# Patient Record
Sex: Male | Born: 1965 | Race: Black or African American | Hispanic: No | Marital: Married | Smoking: Current every day smoker
Health system: Southern US, Community
[De-identification: ages and names within clinical notes are randomized; demographics above are authoritative.]

## PROBLEM LIST (undated history)

## (undated) ENCOUNTER — Telehealth

## (undated) ENCOUNTER — Ambulatory Visit

## (undated) ENCOUNTER — Encounter

## (undated) ENCOUNTER — Ambulatory Visit: Payer: MEDICARE | Attending: Physical Medicine & Rehabilitation | Primary: Physical Medicine & Rehabilitation

## (undated) ENCOUNTER — Inpatient Hospital Stay

## (undated) ENCOUNTER — Ambulatory Visit: Attending: Pharmacist | Primary: Pharmacist

## (undated) ENCOUNTER — Ambulatory Visit: Payer: MEDICARE

## (undated) ENCOUNTER — Ambulatory Visit: Payer: MEDICARE | Attending: Cardiovascular Disease | Primary: Cardiovascular Disease

## (undated) ENCOUNTER — Encounter: Attending: Physical Medicine & Rehabilitation | Primary: Physical Medicine & Rehabilitation

## (undated) ENCOUNTER — Encounter: Attending: Family Medicine | Primary: Family Medicine

## (undated) ENCOUNTER — Institutional Professional Consult (permissible substitution): Payer: MEDICARE | Attending: Physical Medicine & Rehabilitation | Primary: Physical Medicine & Rehabilitation

## (undated) ENCOUNTER — Telehealth: Attending: Neurological Surgery | Primary: Neurological Surgery

## (undated) ENCOUNTER — Ambulatory Visit: Attending: Family Medicine | Primary: Family Medicine

## (undated) DIAGNOSIS — R0602 Shortness of breath: Secondary | ICD-10-CM

## (undated) DIAGNOSIS — I10 Essential (primary) hypertension: Secondary | ICD-10-CM

## (undated) DIAGNOSIS — F419 Anxiety disorder, unspecified: Secondary | ICD-10-CM

## (undated) DIAGNOSIS — F17200 Nicotine dependence, unspecified, uncomplicated: Secondary | ICD-10-CM

## (undated) DIAGNOSIS — R9439 Abnormal result of other cardiovascular function study: Secondary | ICD-10-CM

## (undated) DIAGNOSIS — J45909 Unspecified asthma, uncomplicated: Secondary | ICD-10-CM

## (undated) DIAGNOSIS — E785 Hyperlipidemia, unspecified: Secondary | ICD-10-CM

## (undated) DIAGNOSIS — I2510 Atherosclerotic heart disease of native coronary artery without angina pectoris: Secondary | ICD-10-CM

## (undated) DIAGNOSIS — Z951 Presence of aortocoronary bypass graft: Secondary | ICD-10-CM

## (undated) HISTORY — DX: Shortness of breath: R06.02

## (undated) HISTORY — PX: CORONARY ARTERY BYPASS GRAFT: SHX141

## (undated) HISTORY — DX: Abnormal result of other cardiovascular function study: R94.39

## (undated) HISTORY — PX: CARDIAC CATHETERIZATION: SHX172

## (undated) SURGERY — Surgical Case
Anesthesia: *Unknown

## (undated) MED ORDER — RANOLAZINE ORAL: ORAL | 0 days

## (undated) MED ORDER — REPATHA SURECLICK 140 MG/ML SUBCUTANEOUS PEN INJECTOR: 140 mg | mL | 2 refills | 28 days | Status: CN

## (undated) MED ORDER — NITROGLYCERIN 0.4 MG SUBLINGUAL TABLET: SUBLINGUAL | 0 days | PRN

---
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
## 1999-01-14 ENCOUNTER — Emergency Department (HOSPITAL_COMMUNITY): Admission: EM | Admit: 1999-01-14 | Discharge: 1999-01-14 | Payer: Self-pay | Admitting: Emergency Medicine

## 2010-06-23 ENCOUNTER — Emergency Department (HOSPITAL_COMMUNITY)
Admission: EM | Admit: 2010-06-23 | Discharge: 2010-06-23 | Disposition: A | Discharge status: Home / Self Care | Payer: Self-pay | Attending: Emergency Medicine | Admitting: Emergency Medicine

## 2010-06-23 DIAGNOSIS — K089 Disorder of teeth and supporting structures, unspecified: Secondary | ICD-10-CM | POA: Insufficient documentation

## 2010-06-23 DIAGNOSIS — K047 Periapical abscess without sinus: Secondary | ICD-10-CM | POA: Insufficient documentation

## 2012-07-15 ENCOUNTER — Emergency Department (HOSPITAL_COMMUNITY)
Admission: EM | Admit: 2012-07-15 | Discharge: 2012-07-15 | Disposition: A | Discharge status: Home / Self Care | Payer: No Typology Code available for payment source | Attending: Emergency Medicine | Admitting: Emergency Medicine

## 2012-07-15 ENCOUNTER — Emergency Department (HOSPITAL_COMMUNITY): Payer: No Typology Code available for payment source

## 2012-07-15 ENCOUNTER — Encounter (HOSPITAL_COMMUNITY): Payer: Self-pay

## 2012-07-15 DIAGNOSIS — M79605 Pain in left leg: Secondary | ICD-10-CM

## 2012-07-15 DIAGNOSIS — Y9301 Activity, walking, marching and hiking: Secondary | ICD-10-CM | POA: Insufficient documentation

## 2012-07-15 DIAGNOSIS — Y9289 Other specified places as the place of occurrence of the external cause: Secondary | ICD-10-CM | POA: Insufficient documentation

## 2012-07-15 DIAGNOSIS — IMO0002 Reserved for concepts with insufficient information to code with codable children: Secondary | ICD-10-CM | POA: Insufficient documentation

## 2012-07-15 DIAGNOSIS — W010XXA Fall on same level from slipping, tripping and stumbling without subsequent striking against object, initial encounter: Secondary | ICD-10-CM | POA: Insufficient documentation

## 2012-07-15 DIAGNOSIS — S99929A Unspecified injury of unspecified foot, initial encounter: Secondary | ICD-10-CM | POA: Insufficient documentation

## 2012-07-15 DIAGNOSIS — T148XXA Other injury of unspecified body region, initial encounter: Secondary | ICD-10-CM

## 2012-07-15 DIAGNOSIS — W102XXA Fall (on)(from) incline, initial encounter: Secondary | ICD-10-CM

## 2012-07-15 DIAGNOSIS — W172XXA Fall into hole, initial encounter: Secondary | ICD-10-CM | POA: Insufficient documentation

## 2012-07-15 DIAGNOSIS — S8990XA Unspecified injury of unspecified lower leg, initial encounter: Secondary | ICD-10-CM | POA: Insufficient documentation

## 2012-07-15 MED ORDER — OXYCODONE-ACETAMINOPHEN 5-325 MG PO TABS
2.0000 | ORAL_TABLET | Freq: Once | ORAL | Status: AC
  Administered 2012-07-15: 2 via ORAL
  Filled 2012-07-15: qty 2

## 2012-07-15 MED ORDER — NAPROXEN 500 MG PO TABS: 500.0000 mg | ORAL_TABLET | Freq: Two times a day (BID) | ORAL | Status: DC

## 2012-07-15 NOTE — ED Provider Notes (Signed)
History     CSN: [id number]  Arrival date & time 07/15/12  7829   First MD Initiated Contact with Patient 07/15/12 0701      Chief Complaint  Patient presents with  . Fall  . Leg Pain    (Consider location/radiation/quality/duration/timing/severity/associated sxs/prior treatment) The history is provided by the patient and medical records. No language interpreter was used.    Benjamin Espinoza is a 47 y.o. male  with a hx of childhood asthma presents to the Emergency Department complaining of gradual, persistent, progressively worsening pain in his left ankle and leg after falling into a dirtch onset approx 2 am. Pt states he was walking along when he stumbled and fell into a ditch. He was able to get up and walk home, but when he awoke later the pain was intense and he was unable to bear weight.  Pt states he smoked mariajuana tonight, but did not use any alcohol.  Associated symptoms include pain in the L leg and wound to the L lower leg.  Nothing makes it better and attempting to move makes it worse.  Pt denies fever, chills, headache, neck pain, back pain, chest pain SOB, nausea, vomiting, diarrhea, weakness, dizziness, abdominal pain.     History reviewed. No pertinent past medical history.  History reviewed. No pertinent past surgical history.  No family history on file.  History  Substance Use Topics  . Smoking status: Not on file  . Smokeless tobacco: Not on file  . Alcohol Use: Not on file      Review of Systems  Constitutional: Negative for fever and chills.  HENT: Negative for neck pain and neck stiffness.   Respiratory: Negative for shortness of breath.   Cardiovascular: Negative for chest pain.  Gastrointestinal: Negative for nausea, vomiting and abdominal pain.  Musculoskeletal: Positive for joint swelling, arthralgias and gait problem. Negative for back pain.  Skin: Positive for wound.  Allergic/Immunologic: Negative for immunocompromised state.    Neurological: Negative for numbness.  Hematological: Does not bruise/bleed easily.  Psychiatric/Behavioral: The patient is not nervous/anxious.   All other systems reviewed and are negative.    Allergies  Review of patient's allergies indicates no known allergies.  Home Medications   Current Outpatient Rx  Name  Route  Sig  Dispense  Refill  . naproxen (NAPROSYN) 500 MG tablet   Oral   Take 1 tablet (500 mg total) by mouth 2 (two) times daily with a meal.   30 tablet   0     BP 129/75  Pulse 72  Temp(Src) 98.2 F (36.8 C) (Oral)  Resp 18  SpO2 98%  Physical Exam  Nursing note and vitals reviewed. Constitutional: He is oriented to person, place, and time. He appears well-developed and well-nourished. No distress.  HENT:  Head: Normocephalic and atraumatic.  Mouth/Throat: Oropharynx is clear and moist.  Eyes: Conjunctivae and EOM are normal. Pupils are equal, round, and reactive to light.  Neck: Normal range of motion and full passive range of motion without pain. No spinous process tenderness and no muscular tenderness present. No rigidity. Normal range of motion present.  Cardiovascular: Normal rate, regular rhythm, normal heart sounds and intact distal pulses.   No murmur heard. Capillary refill < 3 seconds  Pulmonary/Chest: Effort normal and breath sounds normal. No respiratory distress. He has no wheezes. He has no rales.  Abdominal: Soft. Bowel sounds are normal. There is no tenderness.  Musculoskeletal: He exhibits tenderness. He exhibits no edema.  Left lower leg: He exhibits tenderness and swelling. He exhibits no bony tenderness, no edema, no deformity and no laceration.       Legs: ROM: full ROM of the bilateral lower extremities  Lymphadenopathy:    He has no cervical adenopathy.  Neurological: He is alert and oriented to person, place, and time. Coordination normal. GCS eye subscore is 4. GCS verbal subscore is 5. GCS motor subscore is 6.  Reflex  Scores:      Tricep reflexes are 2+ on the right side and 2+ on the left side.      Bicep reflexes are 2+ on the right side and 2+ on the left side.      Brachioradialis reflexes are 2+ on the right side and 2+ on the left side.      Patellar reflexes are 2+ on the right side and 2+ on the left side.      Achilles reflexes are 2+ on the right side and 2+ on the left side. Sensation intact to dull and sharp Strength 5/5 in the bilateral lower extremities  Skin: Skin is warm and dry. He is not diaphoretic. No erythema.  No tenting of the skin Large abrasion to the left anterior lower leg  Psychiatric: He has a normal mood and affect.    ED Course  Procedures (including critical care time)  Labs Reviewed - No data to display Dg Tibia/fibula Left  07/15/2012   *RADIOLOGY REPORT*  Clinical Data: Injury  LEFT TIBIA AND FIBULA - 2 VIEW  Comparison: None.  Findings: No acute fracture and no dislocation.  IMPRESSION: No acute bony pathology.   Original Report Authenticated By: Jolaine Click, M.D.   Dg Ankle Complete Left  07/15/2012   *RADIOLOGY REPORT*  Clinical Data: Fall  LEFT ANKLE COMPLETE - 3+ VIEW  Comparison: None.  Findings: No acute fracture.  No dislocation.  IMPRESSION: No acute bony pathology.   Original Report Authenticated By: Jolaine Click, M.D.     1. Fall (on)(from) incline, initial encounter   2. Abrasion   3. Leg pain, anterior, left       MDM  [name] presents with extremity pain after fall with large abrasion. Patient X-Ray negative for obvious fracture or dislocation. I personally reviewed the imaging tests through PACS system.  I reviewed available ER/hospitalization records through the EMR.  Pain managed in ED. Would cleaned and extremity wrapped with ace bandage.  Pt advised to follow up with orthopedics if symptoms persist for possibility of missed fracture diagnosis. Patient given crutches while in ED, conservative therapy recommended and discussed. Patient  will be dc home & is agreeable with above plan.         Dahlia Client Saylah Ketner, PA-C 07/15/12 [phone]

## 2012-07-15 NOTE — ED Notes (Signed)
Pt arrives by EMS-walking home this AM on a path and states he fell in to a ditch-now c/o LLE pain and unable to bear weight-abrasion below left knee-no deformity per EMS-pain mid shaft/tib-fib.

## 2012-07-15 NOTE — ED Notes (Signed)
[id number]<AV> Expected date:<BR> Expected time:<BR> Means of arrival:<BR> Comments:<BR> EMS/46 yo male-fall yesterday and now c/o LLE pain-unable to bear weight

## 2012-07-16 NOTE — ED Provider Notes (Signed)
Medical screening examination/treatment/procedure(s) were performed by non-physician practitioner and as supervising physician I was immediately available for consultation/collaboration.  Kelin Borum, MD 07/16/12 1040 

## 2015-03-02 DIAGNOSIS — I219 Acute myocardial infarction, unspecified: Secondary | ICD-10-CM

## 2015-03-02 HISTORY — DX: Acute myocardial infarction, unspecified: I21.9

## 2016-09-16 ENCOUNTER — Ambulatory Visit
Admission: RE | Admit: 2016-09-16 | Discharge: 2016-09-16 | Payer: MEDICAID | Attending: Cardiovascular Disease | Admitting: Cardiovascular Disease

## 2016-09-16 DIAGNOSIS — R0789 Other chest pain: Principal | ICD-10-CM

## 2016-09-28 ENCOUNTER — Emergency Department
Admission: EM | Admit: 2016-09-28 | Discharge: 2016-09-28 | Discharge status: Against Medical Advice | Source: Intra-hospital

## 2016-09-28 ENCOUNTER — Ambulatory Visit: Admission: RE | Admit: 2016-09-28 | Discharge: 2016-09-28 | Disposition: A | Discharge status: Home / Self Care

## 2016-09-28 DIAGNOSIS — M545 Low back pain: Principal | ICD-10-CM

## 2016-09-28 MED ORDER — PENICILLIN V POTASSIUM 500 MG TABLET
ORAL_TABLET | Freq: Four times a day (QID) | ORAL | 0 refills | 0.00000 days | Status: CP
Start: 2016-09-28 — End: 2016-10-08

## 2016-09-28 MED ORDER — IBUPROFEN 600 MG TABLET
ORAL_TABLET | Freq: Four times a day (QID) | ORAL | 0 refills | 0 days | Status: CP | PRN
Start: 2016-09-28 — End: 2018-02-27

## 2016-09-29 ENCOUNTER — Ambulatory Visit
Admission: RE | Admit: 2016-09-29 | Discharge: 2016-10-28 | Disposition: A | Discharge status: Home / Self Care | Payer: MEDICAID

## 2016-09-29 DIAGNOSIS — R0789 Other chest pain: Principal | ICD-10-CM

## 2016-11-08 ENCOUNTER — Ambulatory Visit: Admission: RE | Admit: 2016-11-08 | Discharge: 2016-11-08 | Disposition: A | Discharge status: Home / Self Care

## 2016-11-08 DIAGNOSIS — M5116 Intervertebral disc disorders with radiculopathy, lumbar region: Principal | ICD-10-CM

## 2016-11-17 ENCOUNTER — Ambulatory Visit
Admission: RE | Admit: 2016-11-17 | Discharge: 2016-11-17 | Disposition: A | Discharge status: Home / Self Care | Payer: MEDICAID

## 2016-11-17 DIAGNOSIS — M5137 Other intervertebral disc degeneration, lumbosacral region: Principal | ICD-10-CM

## 2016-11-17 DIAGNOSIS — M5116 Intervertebral disc disorders with radiculopathy, lumbar region: Principal | ICD-10-CM

## 2017-05-07 ENCOUNTER — Encounter (HOSPITAL_COMMUNITY): Payer: Self-pay | Admitting: Emergency Medicine

## 2017-05-07 ENCOUNTER — Emergency Department (HOSPITAL_COMMUNITY): Payer: Medicaid Other

## 2017-05-07 ENCOUNTER — Other Ambulatory Visit: Payer: Self-pay

## 2017-05-07 ENCOUNTER — Observation Stay (HOSPITAL_COMMUNITY)
Admission: EM | Admit: 2017-05-07 | Discharge: 2017-05-08 | Disposition: A | Discharge status: Home / Self Care | Payer: Medicaid Other | Attending: Cardiology | Admitting: Cardiology

## 2017-05-07 DIAGNOSIS — I160 Hypertensive urgency: Secondary | ICD-10-CM | POA: Diagnosis not present

## 2017-05-07 DIAGNOSIS — R079 Chest pain, unspecified: Secondary | ICD-10-CM | POA: Diagnosis present

## 2017-05-07 DIAGNOSIS — R0789 Other chest pain: Secondary | ICD-10-CM

## 2017-05-07 DIAGNOSIS — I200 Unstable angina: Principal | ICD-10-CM

## 2017-05-07 DIAGNOSIS — Z79899 Other long term (current) drug therapy: Secondary | ICD-10-CM | POA: Insufficient documentation

## 2017-05-07 HISTORY — DX: Presence of aortocoronary bypass graft: Z95.1

## 2017-05-07 HISTORY — DX: Other chest pain: R07.89

## 2017-05-07 HISTORY — DX: Hyperlipidemia, unspecified: E78.5

## 2017-05-07 HISTORY — DX: Nicotine dependence, unspecified, uncomplicated: F17.200

## 2017-05-07 HISTORY — DX: Essential (primary) hypertension: I10

## 2017-05-07 LAB — I-STAT TROPONIN, ED: Troponin i, poc: 0.01 ng/mL (ref 0.00–0.08)

## 2017-05-07 LAB — LIPID PANEL
CHOLESTEROL: 191 mg/dL (ref 0–200)
HDL: 27 mg/dL — ABNORMAL LOW (ref >40)
LDL Cholesterol: 152 mg/dL — ABNORMAL HIGH (ref 0–99)
Total CHOL/HDL Ratio: 7.1 RATIO
Triglycerides: 61 mg/dL (ref <150)
VLDL: 12 mg/dL (ref 0–40)

## 2017-05-07 LAB — CBC
HEMATOCRIT: 45 % (ref 39.0–52.0)
Hemoglobin: 15.4 g/dL (ref 13.0–17.0)
MCH: 31.2 pg (ref 26.0–34.0)
MCHC: 34.2 g/dL (ref 30.0–36.0)
MCV: 91.3 fL (ref 78.0–100.0)
Platelets: 320 10*3/uL (ref 150–400)
RBC: 4.93 MIL/uL (ref 4.22–5.81)
RDW: 14.1 % (ref 11.5–15.5)
WBC: 6.3 10*3/uL (ref 4.0–10.5)

## 2017-05-07 LAB — BASIC METABOLIC PANEL
Anion gap: 10 (ref 5–15)
BUN: 9 mg/dL (ref 6–20)
CO2: 24 mmol/L (ref 22–32)
Calcium: 10.1 mg/dL (ref 8.9–10.3)
Chloride: 103 mmol/L (ref 101–111)
Creatinine, Ser: 1.04 mg/dL (ref 0.61–1.24)
GFR calc Af Amer: >60 mL/min (ref >60)
GLUCOSE: 95 mg/dL (ref 65–99)
POTASSIUM: 3.7 mmol/L (ref 3.5–5.1)
Sodium: 137 mmol/L (ref 135–145)

## 2017-05-07 LAB — TROPONIN I

## 2017-05-07 LAB — HEPARIN LEVEL (UNFRACTIONATED): Heparin Unfractionated: 0.52 IU/mL (ref 0.30–0.70)

## 2017-05-07 MED ORDER — ALPRAZOLAM 0.25 MG PO TABS: 0.2500 mg | ORAL_TABLET | Freq: Two times a day (BID) | ORAL | Status: DC | PRN

## 2017-05-07 MED ORDER — HYDROCHLOROTHIAZIDE 12.5 MG PO CAPS
12.5000 mg | ORAL_CAPSULE | Freq: Every day | ORAL | Status: DC
  Administered 2017-05-07 – 2017-05-08 (×2): 12.5 mg via ORAL
  Filled 2017-05-07 (×2): qty 1

## 2017-05-07 MED ORDER — ACETAMINOPHEN 325 MG PO TABS
650.0000 mg | ORAL_TABLET | ORAL | Status: DC | PRN
  Administered 2017-05-07: 650 mg via ORAL
  Filled 2017-05-07: qty 2

## 2017-05-07 MED ORDER — ONDANSETRON HCL 4 MG/2ML IJ SOLN: 4.0000 mg | Freq: Four times a day (QID) | INTRAMUSCULAR | Status: DC | PRN

## 2017-05-07 MED ORDER — HEPARIN (PORCINE) IN NACL 100-0.45 UNIT/ML-% IJ SOLN
1200.0000 [IU]/h | INTRAMUSCULAR | Status: DC
  Administered 2017-05-07 – 2017-05-08 (×2): 1200 [IU]/h via INTRAVENOUS
  Filled 2017-05-07 (×2): qty 250

## 2017-05-07 MED ORDER — ASPIRIN EC 81 MG PO TBEC
81.0000 mg | DELAYED_RELEASE_TABLET | Freq: Every day | ORAL | Status: DC
  Administered 2017-05-08: 81 mg via ORAL
  Filled 2017-05-07 (×2): qty 1

## 2017-05-07 MED ORDER — METOPROLOL SUCCINATE ER 50 MG PO TB24
50.0000 mg | ORAL_TABLET | Freq: Every day | ORAL | Status: DC
  Administered 2017-05-07 – 2017-05-08 (×2): 50 mg via ORAL
  Filled 2017-05-07 (×2): qty 1

## 2017-05-07 MED ORDER — SODIUM CHLORIDE 0.9% FLUSH: 3.0000 mL | INTRAVENOUS | Status: DC | PRN

## 2017-05-07 MED ORDER — NITROGLYCERIN 0.4 MG SL SUBL: 0.4000 mg | SUBLINGUAL_TABLET | SUBLINGUAL | Status: DC | PRN

## 2017-05-07 MED ORDER — SODIUM CHLORIDE 0.9% FLUSH
3.0000 mL | Freq: Two times a day (BID) | INTRAVENOUS | Status: DC
  Administered 2017-05-07 – 2017-05-08 (×2): 3 mL via INTRAVENOUS

## 2017-05-07 MED ORDER — LISINOPRIL 20 MG PO TABS
20.0000 mg | ORAL_TABLET | Freq: Every day | ORAL | Status: DC
  Administered 2017-05-07 – 2017-05-08 (×2): 20 mg via ORAL
  Filled 2017-05-07 (×2): qty 1

## 2017-05-07 MED ORDER — NITROGLYCERIN IN D5W 200-5 MCG/ML-% IV SOLN
0.0000 ug/min | Freq: Once | INTRAVENOUS | Status: AC
  Administered 2017-05-07: 5 ug/min via INTRAVENOUS
  Filled 2017-05-07: qty 250

## 2017-05-07 MED ORDER — HEPARIN BOLUS VIA INFUSION
4000.0000 [IU] | Freq: Once | INTRAVENOUS | Status: AC
  Administered 2017-05-07: 4000 [IU] via INTRAVENOUS
  Filled 2017-05-07: qty 4000

## 2017-05-07 MED ORDER — AMLODIPINE BESYLATE 5 MG PO TABS
5.0000 mg | ORAL_TABLET | Freq: Every day | ORAL | Status: DC
  Administered 2017-05-07 – 2017-05-08 (×2): 5 mg via ORAL
  Filled 2017-05-07 (×2): qty 1

## 2017-05-07 MED ORDER — ATORVASTATIN CALCIUM 80 MG PO TABS
80.0000 mg | ORAL_TABLET | Freq: Every day | ORAL | Status: DC
  Administered 2017-05-07: 80 mg via ORAL
  Filled 2017-05-07 (×2): qty 1

## 2017-05-07 MED ORDER — SODIUM CHLORIDE 0.9 % IV SOLN: 250.0000 mL | INTRAVENOUS | Status: DC | PRN

## 2017-05-07 NOTE — Progress Notes (Addendum)
ANTICOAGULATION CONSULT NOTE - Follow Up Consult  Pharmacy Consult for Heparin  Indication: chest pain/ACS  No Known Allergies  Patient Measurements:   Heparin Dosing Weight: 99 kg  Vital Signs: Temp: 98.6 F (37 C) (03/09 1230) Temp Source: Oral (03/09 1230) BP: 145/107 (03/09 1430) Pulse Rate: 74 (03/09 1430)  Labs: Recent Labs    05/07/17 1240  HGB 15.4  HCT 45.0  PLT 320  CREATININE 1.04    CrCl cannot be calculated (Unknown ideal weight.).  Assessment: 52 year old male to begin heparin for chest pain History of CABG in the past  Goal of Therapy:  Heparin level 0.3-0.7 units/ml Monitor platelets by anticoagulation protocol: Yes   Plan:  Heparin 4000 units iv bolus x 1 Heparin drip at 1200 units / hr Heparin level 6 hours after heparin starts Daily heparin level, CBC  Thank you Okey Regal, PharmD [phone]  05/07/2017,2:44 PM

## 2017-05-07 NOTE — ED Provider Notes (Signed)
[facility] EMERGENCY DEPARTMENT Provider Note   CSN: [id number] Arrival date & time: 05/07/17  1215     History   Chief Complaint Chief Complaint  Patient presents with  . Chest Pain    HPI Benjamin Espinoza is a 52 y.o. male.  Complains of left anterior chest pain described as pressure onset 10 AM today while at rest.  Worse with exertion.  Feels like "heart pain" he said in the past.  No associated nausea shortness of breath or sweatiness.  He was treated in the field with aspirin 700 mg as well as 2 sublingual nitroglycerin tablets which transiently alleviated his discomfort.  Presently discomfort is 3 on a scale of 1-10.  Patient recently evaluated at [facility].  He was determined that he needed to have a cardiac catheterization which she reports is scheduled for 05/09/2017.  He reports that he has been out of all of his medications for the past 5 days since he has been incarcerated  HPI  History reviewed. No pertinent past medical history.  There are no active problems to display for this patient.   History reviewed. No pertinent surgical history.  Past medical history coronary artery disease hypercholesterolemia Past surgical history CABG   Home Medications    Prior to Admission medications   Medication Sig Start Date End Date Taking? Authorizing Provider  naproxen (NAPROSYN) 500 MG tablet Take 1 tablet (500 mg total) by mouth 2 (two) times daily with a meal. 07/15/12   Muthersbaugh, Dahlia Client, PA-C    Family History No family history on file.  Social History Social History   Tobacco Use  . Smoking status: Not on file  Substance Use Topics  . Alcohol use: No    Frequency: Never  . Drug use: No  Positive smoker no alcohol positive marijuana use Currently incarcerated  Allergies   Patient has no known allergies.   Review of Systems Review of Systems  Constitutional: Negative.   HENT: Positive for dental problem.        Gingival  pain at lower gingiva  Respiratory: Negative.   Cardiovascular: Positive for chest pain.  Gastrointestinal: Negative.   Musculoskeletal: Negative.   Skin: Negative.   Neurological: Negative.   Psychiatric/Behavioral: Negative.   All other systems reviewed and are negative.    Physical Exam Updated Vital Signs BP (!) 145/106   Pulse 71   Temp 98.6 F (37 C) (Oral)   Resp 15   SpO2 94%   Physical Exam  Constitutional: He appears well-developed and well-nourished.  HENT:  Head: Normocephalic and atraumatic.  Teeth intact.  Not loose.  Not tender.  Lower gingiva anteriorly mildly tender not red or swollen or fluctuant  Eyes: Conjunctivae are normal. Pupils are equal, round, and reactive to light.  Neck: Neck supple. No tracheal deviation present. No thyromegaly present.  Cardiovascular: Normal rate and regular rhythm.  No murmur heard. Pulmonary/Chest: Effort normal and breath sounds normal.  Abdominal: Soft. Bowel sounds are normal. He exhibits no distension. There is no tenderness.  Musculoskeletal: Normal range of motion. He exhibits no edema or tenderness.  Neurological: He is alert. Coordination normal.  Skin: Skin is warm and dry. No rash noted.  Psychiatric: He has a normal mood and affect.  Nursing note and vitals reviewed.    ED Treatments / Results  Labs (all labs ordered are listed, but only abnormal results are displayed) Labs Reviewed  BASIC METABOLIC PANEL  CBC  I-STAT TROPONIN, ED  EKG  EKG Interpretation  Date/Time:  Saturday May 07 2017 12:28:23 EST Ventricular Rate:  64 PR Interval:    QRS Duration: 97 QT Interval:  414 QTC Calculation: 428 R Axis:   110 Text Interpretation:  Sinus rhythm Right axis deviation Consider left ventricular hypertrophy No old tracing to compare Confirmed by Harriman, Doreatha Martin [id number]) on 05/07/2017 12:30:48 PM       Radiology Dg Chest 2 View  Result Date: 05/07/2017 CLINICAL DATA:  Chest pain EXAM: CHEST - 2  VIEW COMPARISON:  02/01/2017 FINDINGS: Normal heart size and mediastinal contours. Status post CABG. There is no edema, consolidation, effusion, or pneumothorax. No acute osseous finding IMPRESSION: No evidence of active disease. Electronically Signed   By: Marnee Spring M.D.   On: 05/07/2017 13:20   Chest x-ray viewed by me Procedures Procedures (including critical care time)  Medications Ordered in ED Medications  nitroGLYCERIN 50 mg in dextrose 5 % 250 mL (0.2 mg/mL) infusion (not administered)   Results for orders placed or performed during the hospital encounter of 05/07/17  Basic metabolic panel  Result Value Ref Range   Sodium 137 135 - 145 mmol/L   Potassium 3.7 3.5 - 5.1 mmol/L   Chloride 103 101 - 111 mmol/L   CO2 24 22 - 32 mmol/L   Glucose, Bld 95 65 - 99 mg/dL   BUN 9 6 - 20 mg/dL   Creatinine, Ser 5.36 0.61 - 1.24 mg/dL   Calcium 14.4 8.9 - 31.5 mg/dL   GFR calc non Af Amer >60 >60 mL/min   GFR calc Af Amer >60 >60 mL/min   Anion gap 10 5 - 15  CBC  Result Value Ref Range   WBC 6.3 4.0 - 10.5 K/uL   RBC 4.93 4.22 - 5.81 MIL/uL   Hemoglobin 15.4 13.0 - 17.0 g/dL   HCT 40.0 86.7 - 61.9 %   MCV 91.3 78.0 - 100.0 fL   MCH 31.2 26.0 - 34.0 pg   MCHC 34.2 30.0 - 36.0 g/dL   RDW 50.9 32.6 - 71.2 %   Platelets 320 150 - 400 K/uL  I-stat troponin, ED  Result Value Ref Range   Troponin i, poc 0.01 0.00 - 0.08 ng/mL   Comment 3           Dg Chest 2 View  Result Date: 05/07/2017 CLINICAL DATA:  Chest pain EXAM: CHEST - 2 VIEW COMPARISON:  02/01/2017 FINDINGS: Normal heart size and mediastinal contours. Status post CABG. There is no edema, consolidation, effusion, or pneumothorax. No acute osseous finding IMPRESSION: No evidence of active disease. Electronically Signed   By: Marnee Spring M.D.   On: 05/07/2017 13:20    Initial Impression / Assessment and Plan / ED Course  I have reviewed the triage vital signs and the nursing notes.  Pertinent labs & imaging  results that were available during my care of the patient were reviewed by me and considered in my medical decision making (see chart for details).     Intravenous heparin per pharmacy for ACS ordered by me.  Intravenous nitroglycerin drip to titrate the pain ordered by me.  Dr.Gangi consulted by me and will see patient in ED and arrange for overnight stay 3:25 PM PAIN IMPROVED after treatment with intravenous nitroglycerin .  IV heparin ordered per pharmacy protocol Final Clinical Impressions(s) / ED Diagnoses  Dx Unstable angina #2 tobacco abuse CRITICAL CARE Performed by: Doug Sou Total critical care time: 35 minutes Critical care time was  exclusive of separately billable procedures and treating other patients. Critical care was necessary to treat or prevent imminent or life-threatening deterioration. Critical care was time spent personally by me on the following activities: development of treatment plan with patient and/or surrogate as well as nursing, discussions with consultants, evaluation of patient's response to treatment, examination of patient, obtaining history from patient or surrogate, ordering and performing treatments and interventions, ordering and review of laboratory studies, ordering and review of radiographic studies, pulse oximetry and re-evaluation of patient's condition. Final diagnoses:  None    ED Discharge Orders    None       Doug Sou, MD 05/07/17 1539

## 2017-05-07 NOTE — ED Triage Notes (Signed)
Per EMS: Pt c/o CP. Had CABG back in November, due for a cardiac cath this week at [facility]. Prison nurse gave Pt 700 ASP, EMS gave 2 of nitro with relief. Pt pain from a 5/10 down to a 1/10. NSR A&Ox4. Pt in in prison custody.

## 2017-05-07 NOTE — Progress Notes (Signed)
Patient admitted to 6E08 from the ED via stretcher.  Bed in low position, wheels locked.  Patient denies chest pain/shortness of breath.  Telemetry monitor applied.  Patient oriented to environment, including call bell, TV, meal times, and hourly rounding.  Patient accompanied by two [employer].

## 2017-05-07 NOTE — Progress Notes (Signed)
ANTICOAGULATION CONSULT NOTE - Follow Up Consult  Pharmacy Consult for Heparin  Indication: chest pain/ACS  No Known Allergies  Patient Measurements: Height: 6' (182.9 cm) Weight: 218 lb (98.9 kg) IBW/kg (Calculated) : 77.6 Heparin Dosing Weight: 99 kg  Vital Signs: Temp: 98.7 F (37.1 C) (03/09 2014) Temp Source: Oral (03/09 2014) BP: 105/72 (03/09 2155) Pulse Rate: 65 (03/09 2014)  Labs: Recent Labs    05/07/17 1240 05/07/17 1555 05/07/17 2057  HGB 15.4  --   --   HCT 45.0  --   --   PLT 320  --   --   HEPARINUNFRC  --   --  0.52  CREATININE 1.04  --   --   TROPONINI  --  <0.03  --     Estimated Creatinine Clearance: 102.3 mL/min (by C-G formula based on SCr of 1.04 mg/dL).  Assessment: 52 year old male to begin heparin for chest pain History of CABG in the past  Initial heparin level therapeutic  Goal of Therapy:  Heparin level 0.3-0.7 units/ml Monitor platelets by anticoagulation protocol: Yes   Plan:  Continue heparin at 1200 units / hr Daily heparin level, CBC  Thank you Okey Regal, PharmD   05/07/2017,9:59 PM

## 2017-05-07 NOTE — H&P (Signed)
Benjamin Espinoza is an 52 y.o. male.   Chief Complaint: Palpitations and chest pain HPI: [name] is a 52 year old African-American male with history of hypertension, hyperlipidemia, tobacco use disorder, who has had CABG x4 in 2017 in [place], who is presently incarcerated 5 days ago and since then has not been on any home medications.  Patient states that since bypass surgery, he has not had severe chest tightness since CABG, but states that since surgery he has always had a constant left upper and middle side chest tightness.  This morning while he was sitting, felt episodes of palpitations, felt dizzy and thought he was going to pass out, at that time blood pressure was checked and it was extremely high, states that it is greater than 180 mmHg.  He was given sublingual nitroglycerin and was transferred to the emergency room for further evaluation.  Palpitations are described as sudden in onset, lasting a few seconds, mostly occurs at rest.  He feels dizzy with this, and states that it makes him slightly short of breath.  He also described chest discomfort that has been ongoing for several months.  There is been no significant change in his chest discomfort.  He still persist to have chest discomfort today, but states that once the nitroglycerin was started, symptoms improved.  Past Medical History:  Diagnosis Date  . Essential hypertension   . Hx of CABG x4 01/14/2016 [place]  . Hyperlipidemia   . Tobacco use disorder     History reviewed. No pertinent surgical history.  No family history on file. Social History:  reports that he does not drink alcohol or use drugs. His tobacco history is not on file.  Allergies: No Known Allergies   (Not in a hospital admission)  Results for orders placed or performed during the hospital encounter of 05/07/17 (from the past 48 hour(s))  Basic metabolic panel     Status: None   Collection Time: 05/07/17 12:40 PM  Result Value Ref  Range   Sodium 137 135 - 145 mmol/L   Potassium 3.7 3.5 - 5.1 mmol/L   Chloride 103 101 - 111 mmol/L   CO2 24 22 - 32 mmol/L   Glucose, Bld 95 65 - 99 mg/dL   BUN 9 6 - 20 mg/dL   Creatinine, Ser 1.04 0.61 - 1.24 mg/dL   Calcium 10.1 8.9 - 10.3 mg/dL   GFR calc non Af Amer >60 >60 mL/min   GFR calc Af Amer >60 >60 mL/min    Comment: (NOTE) The eGFR has been calculated using the CKD EPI equation. This calculation has not been validated in all clinical situations. eGFR's persistently <60 mL/min signify possible Chronic Kidney Disease.    Anion gap 10 5 - 15    Comment: Performed at [facility] [address], [place] [address]  CBC     Status: None   Collection Time: 05/07/17 12:40 PM  Result Value Ref Range   WBC 6.3 4.0 - 10.5 K/uL   RBC 4.93 4.22 - 5.81 MIL/uL   Hemoglobin 15.4 13.0 - 17.0 g/dL   HCT 45.0 39.0 - 52.0 %   MCV 91.3 78.0 - 100.0 fL   MCH 31.2 26.0 - 34.0 pg   MCHC 34.2 30.0 - 36.0 g/dL   RDW 14.1 11.5 - 15.5 %   Platelets 320 150 - 400 K/uL    Comment: Performed at [facility] [address], [place], [place] [address]  I-stat troponin,  ED     Status: None   Collection Time: 05/07/17 12:47 PM  Result Value Ref Range   Troponin i, poc 0.01 0.00 - 0.08 ng/mL   Comment 3            Comment: Due to the release kinetics of cTnI, a negative result within the first hours of the onset of symptoms does not rule out myocardial infarction with certainty. If myocardial infarction is still suspected, repeat the test at appropriate intervals.    Dg Chest 2 View  Result Date: 05/07/2017 CLINICAL DATA:  Chest pain EXAM: CHEST - 2 VIEW COMPARISON:  02/01/2017 FINDINGS: Normal heart size and mediastinal contours. Status post CABG. There is no edema, consolidation, effusion, or pneumothorax. No acute osseous finding IMPRESSION: No evidence of active disease. Electronically Signed   By: Monte Fantasia M.D.   On: 05/07/2017 13:20    Review of  Systems  Constitutional: Negative.   HENT: Negative.   Eyes: Positive for blurred vision (left eye loss of vision 2 months ago for 15 seconds).  Respiratory: Positive for shortness of breath. Negative for sputum production and wheezing.   Cardiovascular: Positive for chest pain, palpitations and claudication (right calf worse than left).  Gastrointestinal: Positive for heartburn. Negative for abdominal pain, diarrhea, nausea and vomiting.  Genitourinary: Negative.   Musculoskeletal: Negative.   Skin: Negative.   Neurological: Positive for dizziness (Occasionally with palpitations).  Endo/Heme/Allergies: Negative.   Psychiatric/Behavioral: Negative.   All other systems reviewed and are negative.   Blood pressure (!) 159/108, pulse 65, temperature 98.6 F (37 C), temperature source Oral, resp. rate 16, height 6' (1.829 m), weight 98.9 kg (218 lb), SpO2 94 %. Physical Exam  Constitutional: He is oriented to person, place, and time. He appears well-developed and well-nourished.  HENT:  Head: Atraumatic.  Eyes: Conjunctivae are normal.  Neck: Normal range of motion. Neck supple.  Cardiovascular: Normal rate, regular rhythm and normal heart sounds.  Bilateral carotids normal, femoral artery normal, bilateral popliteals 1+.  Pedal pulses are faint bilaterally.  Capillary refill normal.  Respiratory: Effort normal and breath sounds normal.  GI: Soft. Bowel sounds are normal.  Musculoskeletal: Normal range of motion.  Neurological: He is alert and oriented to person, place, and time.  Skin: Skin is warm and dry.   BMP Latest Ref Rng & Units 05/07/2017  Glucose 65 - 99 mg/dL 95  BUN 6 - 20 mg/dL 9  Creatinine 0.61 - 1.24 mg/dL 1.04  Sodium 135 - 145 mmol/L 137  Potassium 3.5 - 5.1 mmol/L 3.7  Chloride 101 - 111 mmol/L 103  CO2 22 - 32 mmol/L 24  Calcium 8.9 - 10.3 mg/dL 10.1   CBC Latest Ref Rng & Units 05/07/2017  WBC 4.0 - 10.5 K/uL 6.3  Hemoglobin 13.0 - 17.0 g/dL 15.4  Hematocrit  39.0 - 52.0 % 45.0  Platelets 150 - 400 K/uL 320   Troponin (Point of Care Test) Recent Labs    05/07/17 1247  TROPIPOC 0.01    Cardiac Panel (last 3 results) No results for input(s): CKTOTAL, CKMB, TROPONINI, RELINDX in the last 72 hours.  EKG 05/07/2017: Normal sinus rhythm, no evidence of ischemia.  Scheduled Meds: . heparin  4,000 Units Intravenous Once   Continuous Infusions: . heparin     PRN Meds:.  Assessment/Plan  1.  Atypical chest pain, patient's symptoms ongoing for the past several months and has remained unchanged. 2.  Palpitations, episodes occurring mostly at rest and lasting few seconds,  suspect PACs and PVCs.  Do not suspect significant arrhythmias. 3.  Hypertensive urgency, his symptoms may be precipitated by lack of medications for the past 5 days and patient attributes palpitations due to not taking carvedilol. 4.  Coronary artery disease S/P CABG x4 in [place] on 01/14/2016. 5.  Episodes of transient vision loss and flashes of light  in his left eye, last occurrence 2 months ago, was evaluated by ophthalmology and found to have no evidence of ischemia.  Was recommended outpatient carotid duplex. 6.  Tobacco use disorder 7.  Hyperlipidemia  Recommendation: Patient symptoms of palpitations that led him to be transferred to the hospital was probably related to withdrawal from beta-blocker use.  His blood pressure is uncontrolled today.  Chest pain symptoms are ongoing and constant for the past 5 months or more, states that since surgery he always has had this chest discomfort.  It is at most atypical.  I will continue IV nitroglycerin, IV heparin for now, unless troponins tend to be positive, then I will consider coronary angiography.  Otherwise I will try to set him up for a Lexiscan Myoview stress test tomorrow morning along with obtaining an echocardiogram to evaluate for wall motion abnormality.  I will restart his medications, he does not remember what  medicines he is on, will start him on beta-blocker, metoprolol XL 50 mg daily along with amlodipine 5 mg daily along with an ACE inhibitor, lisinopril HCT 20/12.5 mg every morning and also prescribed nitroglycerin.  Adrian Prows, MD 05/07/2017, 3:41 PM [facility] [place] Pager: [phone] Office: [phone] If no answer: Cell:  [phone]

## 2017-05-08 ENCOUNTER — Observation Stay (HOSPITAL_COMMUNITY): Payer: Medicaid Other

## 2017-05-08 DIAGNOSIS — Z79899 Other long term (current) drug therapy: Secondary | ICD-10-CM | POA: Diagnosis not present

## 2017-05-08 DIAGNOSIS — I200 Unstable angina: Secondary | ICD-10-CM | POA: Diagnosis not present

## 2017-05-08 DIAGNOSIS — I160 Hypertensive urgency: Secondary | ICD-10-CM | POA: Diagnosis not present

## 2017-05-08 LAB — ECHOCARDIOGRAM COMPLETE
Height: 72 in
Weight: 3488 oz

## 2017-05-08 LAB — CBC
HEMATOCRIT: 46.1 % (ref 39.0–52.0)
Hemoglobin: 15.5 g/dL (ref 13.0–17.0)
MCH: 31 pg (ref 26.0–34.0)
MCHC: 33.6 g/dL (ref 30.0–36.0)
MCV: 92.2 fL (ref 78.0–100.0)
PLATELETS: 315 10*3/uL (ref 150–400)
RBC: 5 MIL/uL (ref 4.22–5.81)
RDW: 14.5 % (ref 11.5–15.5)
WBC: 6.6 10*3/uL (ref 4.0–10.5)

## 2017-05-08 LAB — HEPARIN LEVEL (UNFRACTIONATED): Heparin Unfractionated: 0.47 IU/mL (ref 0.30–0.70)

## 2017-05-08 LAB — TROPONIN I: Troponin I: <0.03 ng/mL (ref <0.03)

## 2017-05-08 MED ORDER — ATORVASTATIN CALCIUM 80 MG PO TABS: 80.0000 mg | ORAL_TABLET | Freq: Every day | ORAL | 0 refills | Status: DC

## 2017-05-08 MED ORDER — TECHNETIUM TC 99M TETROFOSMIN IV KIT
10.0000 | PACK | Freq: Once | INTRAVENOUS | Status: AC | PRN
  Administered 2017-05-08: 10 via INTRAVENOUS

## 2017-05-08 MED ORDER — LISINOPRIL-HYDROCHLOROTHIAZIDE 20-12.5 MG PO TABS: 1.0000 | ORAL_TABLET | Freq: Every day | ORAL | 0 refills | Status: DC

## 2017-05-08 MED ORDER — REGADENOSON 0.4 MG/5ML IV SOLN
0.4000 mg | Freq: Once | INTRAVENOUS | Status: AC
  Administered 2017-05-08: 0.4 mg via INTRAVENOUS
  Filled 2017-05-08: qty 5

## 2017-05-08 MED ORDER — ASPIRIN EC 81 MG PO TBEC: 162.0000 mg | DELAYED_RELEASE_TABLET | Freq: Every day | ORAL | 0 refills | Status: DC

## 2017-05-08 MED ORDER — METOPROLOL SUCCINATE ER 50 MG PO TB24: 50.0000 mg | ORAL_TABLET | Freq: Every day | ORAL | 0 refills | Status: DC

## 2017-05-08 MED ORDER — AMLODIPINE BESYLATE 5 MG PO TABS: 5.0000 mg | ORAL_TABLET | Freq: Every day | ORAL | 0 refills | Status: DC

## 2017-05-08 MED ORDER — REGADENOSON 0.4 MG/5ML IV SOLN
INTRAVENOUS | Status: AC
  Administered 2017-05-08: 0.4 mg via INTRAVENOUS
  Filled 2017-05-08: qty 5

## 2017-05-08 MED ORDER — TECHNETIUM TC 99M TETROFOSMIN IV KIT
30.0000 | PACK | Freq: Once | INTRAVENOUS | Status: AC | PRN
  Administered 2017-05-08: 30 via INTRAVENOUS

## 2017-05-08 MED ORDER — NITROGLYCERIN 0.4 MG SL SUBL: 0.4000 mg | SUBLINGUAL_TABLET | SUBLINGUAL | 0 refills | Status: DC | PRN

## 2017-05-08 NOTE — Progress Notes (Signed)
ANTICOAGULATION CONSULT NOTE - Follow Up Consult  Pharmacy Consult for Heparin  Indication: chest pain/ACS  No Known Allergies  Patient Measurements: Height: 6' (182.9 cm) Weight: 218 lb (98.9 kg) IBW/kg (Calculated) : 77.6 Heparin Dosing Weight: 99 kg  Vital Signs: Temp: 98.4 F (36.9 C) (03/10 0505) Temp Source: Oral (03/10 0505) BP: 111/82 (03/10 0925) Pulse Rate: 92 (03/10 0925)  Labs: Recent Labs    05/07/17 1240 05/07/17 1555 05/07/17 2057 05/08/17 0456  HGB 15.4  --   --  15.5  HCT 45.0  --   --  46.1  PLT 320  --   --  315  HEPARINUNFRC  --   --  0.52 0.47  CREATININE 1.04  --   --   --   TROPONINI  --  <0.03 <0.03 <0.03    Estimated Creatinine Clearance: 102.3 mL/min (by C-G formula based on SCr of 1.04 mg/dL).  Assessment: 52 year old male to begin heparin for chest pain. History of CABG x4 2017. Per the patient, chest pain has been ongoing since his surgery. Possible cath planned.   Heparin level therapeutic this AM. CBC wnl and no bleeding reported.   Goal of Therapy Heparin level 0.3-0.7 units/ml Monitor platelets by anticoagulation protocol: Yes   Plan:  Continue heparin at 1200 units / hr Daily heparin level and CBC Follow-up on cardiology plans  Adline Potter, PharmD Pharmacy Resident Pager: [phone]

## 2017-05-08 NOTE — Progress Notes (Signed)
  Echocardiogram 2D Echocardiogram has been performed.  Benjamin Espinoza 05/08/2017, 2:43 PM

## 2017-05-08 NOTE — Discharge Summary (Signed)
Physician Discharge Summary  Patient ID: Benjamin Espinoza MRN: [id number] DOB/AGE: 1965/03/23 52 y.o.  Admit date: 05/07/2017 Discharge date: 05/08/2017  Primary Discharge Diagnosis Angina Pectoris -  Stable Palpitations Hypertensive urgency  Secondary Discharge Diagnosis CAD S/P CABG X 4 in 2017 in [place] Hyperlipidemia Tobacco use disorder.  Significant Diagnostic Studies:  Lexiscan sestamibi stress test 05/07/2017: Moderate sized inducible defect involving the apex and the apical to mid anterior/wall. Global hypokinesis.   Left ventricular ejection fraction 47%. Non invasive risk stratification*:   Echocardiogram 05/07/2017: Normal LV systolic function,LVEF 50-55%. mild LVH.  Mild TR, mild to moderate pulmonary hypertension, here pressure 40-45 mmHg.  Telemetry 05/06/2017: 24 hours of monitoring did not reveal any significant arrhythmia   EKG 05/07/2017: normal EKG, normal sinus rhythm, unchanged from previous tracings.   Hospital Course:  Mr. Zyen Triggs is a 52 year old African-American male with history of hypertension, hyperlipidemia, tobacco use disorder, who has had CABG x4 in 2017 in [place], who is presently incarcerated 5 days ago and since then has not been on any home medications.  He presented with palpitations, dizziness and chest discomfort.  He was restarted back on beta blockers and also ACE inhibitor was initiated along with restarting his statins, he was initially started with IV heparin which was discontinued earlier this morning as he ruled out for myocardial infarction by serial cardiac markers.  Patient essentially remained asymptomatic after initiation of medications, symptoms of chest pain that has been ongoing for many months since October 2018 refer to be chronic stable angina pectoris, although stress test was intermediate risk medical therapy was recommended.   He wishes to follow-up with me once he is released from Incarceration and will  consider coronary angiography if he still persist to have angina pectoris.smoking cessation was discussed in detail  With the patient.   General appearance: alert, cooperative and appears stated age Resp: clear to auscultation bilaterally Chest wall: no tenderness Cardio: regular rate and rhythm, S1, S2 normal, no murmur, click, rub or gallop GI: soft, non-tender; bowel sounds normal; no masses,  no organomegaly Extremities: extremities normal, atraumatic, no cyanosis or edema Pulses: 2+ and symmetric Neurologic: Grossly normal Labs:   Lab Results  Component Value Date   WBC 6.6 05/08/2017   HGB 15.5 05/08/2017   HCT 46.1 05/08/2017   MCV 92.2 05/08/2017   PLT 315 05/08/2017    Recent Labs  Lab 05/07/17 1240  NA 137  K 3.7  CL 103  CO2 24  BUN 9  CREATININE 1.04  CALCIUM 10.1  GLUCOSE 95    Lipid Panel     Component Value Date/Time   CHOL 191 05/07/2017 1555   TRIG 61 05/07/2017 1555   HDL 27 (L) 05/07/2017 1555   CHOLHDL 7.1 05/07/2017 1555   VLDL 12 05/07/2017 1555   LDLCALC 152 (H) 05/07/2017 1555   Cardiac Panel (last 3 results) Recent Labs    05/07/17 1555 05/07/17 2057 05/08/17 0456  TROPONINI <0.03 <0.03 <0.03    Lab Results  Component Value Date   TROPONINI <0.03 05/08/2017    Radiology: Dg Chest 2 View  Result Date: 05/07/2017 CLINICAL DATA:  Chest pain EXAM: CHEST - 2 VIEW COMPARISON:  02/01/2017 FINDINGS: Normal heart size and mediastinal contours. Status post CABG. There is no edema, consolidation, effusion, or pneumothorax. No acute osseous finding IMPRESSION: No evidence of active disease. Electronically Signed   By: Marnee Spring M.D.   On: 05/07/2017 13:20   Nm Myocar Multi W/spect W/wall Motion /  Ef  Result Date: 05/08/2017 CLINICAL DATA:  Prior CABG. Smoker. Hypertension. Elevated triglycerides. EXAM: MYOCARDIAL IMAGING WITH SPECT (REST AND PHARMACOLOGIC-STRESS) GATED LEFT VENTRICULAR WALL MOTION STUDY LEFT VENTRICULAR EJECTION  FRACTION TECHNIQUE: Standard myocardial SPECT imaging was performed after resting intravenous injection of 10 mCi Tc-83m tetrofosmin. Subsequently, intravenous infusion of Lexiscan was performed under the supervision of the Cardiology staff. At peak effect of the drug, 30 mCi Tc-73m tetrofosmin was injected intravenously and standard myocardial SPECT imaging was performed. Quantitative gated imaging was also performed to evaluate left ventricular wall motion, and estimate left ventricular ejection fraction. COMPARISON:  Chest radiograph 05/07/2017. FINDINGS: Perfusion: Rest images demonstrate mild apical thinning versus remote apical infarct. With stress, area of reversibility involving the apex and apical to mid segment of the anteroseptal wall. This is small to medium in size and moderate in severity. Wall Motion: Global hypokinesis. Left Ventricular Ejection Fraction: 47 % End diastolic volume 146 ml End systolic volume 78 ml IMPRESSION: 1. Moderate sized inducible defect involving the apex and the apical to mid anterior/wall. 2. Global hypokinesis. 3. Left ventricular ejection fraction 47% 4. Non invasive risk stratification*: Intermediate *2012 Appropriate Use Criteria for Coronary Revascularization Focused Update: J Am Coll Cardiol. 2012;59(9):857-881. http://content.dementiazones.com.aspx?articleid=[email or web address] These results will be called to the ordering clinician or representative by the Radiologist Assistant, and communication documented in the PACS or zVision Dashboard. For Electronically Signed   By: Jeronimo Greaves M.D.   On: 05/08/2017 12:14      FOLLOW UP PLANS AND APPOINTMENTS  Allergies as of 05/08/2017   No Known Allergies     Medication List    STOP taking these medications   carvedilol 6.25 MG tablet Commonly known as:  COREG   naproxen 500 MG tablet Commonly known as:  NAPROSYN     TAKE these medications   amLODipine 5 MG tablet Commonly known as:  NORVASC Take 1 tablet (5 mg  total) by mouth daily.   aspirin EC 81 MG tablet Take 2 tablets (162 mg total) by mouth daily.   atorvastatin 80 MG tablet Commonly known as:  LIPITOR Take 1 tablet (80 mg total) by mouth at bedtime.   lisinopril-hydrochlorothiazide 20-12.5 MG tablet Commonly known as:  ZESTORETIC Take 1 tablet by mouth daily.   metoprolol succinate 50 MG 24 hr tablet Commonly known as:  TOPROL-XL Take 1 tablet (50 mg total) by mouth daily. Take with or immediately following a meal.   nitroGLYCERIN 0.4 MG SL tablet Commonly known as:  NITROSTAT Place 1 tablet (0.4 mg total) under the tongue every 5 (five) minutes x 3 doses as needed for chest pain.      Follow-up Information    Yates Decamp, MD. Schedule an appointment as soon as possible for a visit in 10 day(s).   Specialty:  Cardiology Why:  Office will contact you in 10 days for a follow-up appointment. Contact information: [address] [place] [address] [phone]          Yates Decamp, MD 05/08/2017, 3:41 PM  Pager: [phone] Office: [phone] If no answer: [phone]

## 2017-05-08 NOTE — Progress Notes (Signed)
Subjective:  No further chest pain since being on blood pressure medication, no further palpitations.  Objective:  Vital Signs in the last 24 hours: Temp:  [98.4 F (36.9 C)-98.7 F (37.1 C)] 98.4 F (36.9 C) (03/10 0505) Pulse Rate:  [57-100] 92 (03/10 0925) Resp:  [11-19] 12 (03/10 0505) BP: (104-159)/(72-108) 111/82 (03/10 0925) SpO2:  [92 %-99 %] 99 % (03/10 0505) Weight:  [98.9 kg (218 lb)] 98.9 kg (218 lb) (03/09 1452)  Intake/Output from previous day: 03/09 0701 - 03/10 0700 In: 765.3 [P.O.:600; I.V.:165.3] Out: 200 [Urine:200]  Physical Exam:  Blood pressure 111/82, pulse 92, temperature 98.4 F (36.9 C), temperature source Oral, resp. rate 12, height 6' (1.829 m), weight 98.9 kg (218 lb), SpO2 99 %.  Constitutional: He is oriented to person, place, and time. He appears well-developed and well-nourished.  HENT:  Head: Atraumatic.  Eyes: Conjunctivae are normal.  Neck: Normal range of motion. Neck supple.  Cardiovascular: Normal rate, regular rhythm and normal heart sounds.  Bilateral carotids normal, femoral artery normal, bilateral popliteals 1+.  Pedal pulses are faint bilaterally.  Capillary refill normal.  Respiratory: Effort normal and breath sounds normal.  GI: Soft. Bowel sounds are normal.  Musculoskeletal: Normal range of motion.  Neurological: He is alert and oriented to person, place, and time.  Skin: Skin is warm and dry.   Lab Results: BMP Recent Labs    05/07/17 1240  NA 137  K 3.7  CL 103  CO2 24  GLUCOSE 95  BUN 9  CREATININE 1.04  CALCIUM 10.1  GFRNONAA >60  GFRAA >60    CBC Recent Labs  Lab 05/08/17 0456  WBC 6.6  RBC 5.00  HGB 15.5  HCT 46.1  PLT 315  MCV 92.2  MCH 31.0  MCHC 33.6  RDW 14.5    HEMOGLOBIN A1C No results found for: HGBA1C, MPG  Cardiac Panel (last 3 results) Recent Labs    05/07/17 1555 05/07/17 2057 05/08/17 0456  TROPONINI <0.03 <0.03 <0.03    Lipid Panel     Component Value Date/Time   CHOL 191 05/07/2017 1555   TRIG 61 05/07/2017 1555   HDL 27 (L) 05/07/2017 1555   CHOLHDL 7.1 05/07/2017 1555   VLDL 12 05/07/2017 1555   LDLCALC 152 (H) 05/07/2017 1555    Imaging: Dg Chest 2 View  Result Date: 05/07/2017 CLINICAL DATA:  Chest pain EXAM: CHEST - 2 VIEW COMPARISON:  02/01/2017 FINDINGS: Normal heart size and mediastinal contours. Status post CABG. There is no edema, consolidation, effusion, or pneumothorax. No acute osseous finding IMPRESSION: No evidence of active disease. Electronically Signed   By: Marnee Spring M.D.   On: 05/07/2017 13:20    Cardiac Studies:   EKG 05/07/2017: Normal sinus rhythm, no evidence of ischemia.  Nuclear stress test 05/08/2017: Pending  ECHO: Pending  Assessment/Plan:  1.  Atypical chest pain, patient's symptoms ongoing for the past several months and has remained unchanged.  No EKG changes with Lexiscan infusion.  Nuclear images pending. 2.  Palpitations, episodes occurring mostly at rest and lasting few seconds, suspect PACs and PVCs.  Do not suspect significant arrhythmias.  No further episodes since started on beta-blockers. 3.  Hypertensive urgency, his symptoms may be precipitated by lack of medications for the past 5 days and patient attributes palpitations due to not taking carvedilol.  Now blood pressure is well controlled on present medical regimen. 4.  Coronary artery disease S/P CABG x4 in [place] on 01/14/2016. 5.  Episodes of  transient vision loss and flashes of light  in his left eye, last occurrence 2 months ago, was evaluated by ophthalmology and found to have no evidence of ischemia.  Was recommended outpatient carotid duplex.  Patient advised to follow-up with his cardiologist. 6.  Tobacco use disorder, discussed smoking cessation. 7.  Hyperlipidemia I have started him on a statin.  Recommendation: If cardiac stress test is negative, he can be discharged home.   Yates Decamp, M.D. 05/08/2017, 10:16 AM [facility] Pager: [phone] Office: [phone] If no answer: [phone]

## 2017-05-08 NOTE — Progress Notes (Signed)
Patient transported to [facility] via w/c.  [employer] accompanying patient.  Heparin drip infusing at 1200 units/hr.

## 2017-05-12 LAB — HIV ANTIBODY (ROUTINE TESTING W REFLEX): HIV Screen 4th Generation wRfx: NONREACTIVE

## 2017-11-08 ENCOUNTER — Ambulatory Visit
Admit: 2017-11-08 | Discharge: 2017-11-08 | Disposition: A | Discharge status: Home / Self Care | Payer: MEDICARE | Attending: Family Medicine

## 2017-11-08 MED ORDER — CARVEDILOL 6.25 MG TABLET
ORAL_TABLET | Freq: Two times a day (BID) | ORAL | 0 refills | 0 days | Status: CP
Start: 2017-11-08 — End: 2017-12-08

## 2017-11-08 MED ORDER — ATORVASTATIN 40 MG TABLET
ORAL_TABLET | Freq: Every day | ORAL | 0 refills | 0.00000 days | Status: CP
Start: 2017-11-08 — End: 2018-09-11

## 2017-11-08 MED ORDER — GABAPENTIN 300 MG CAPSULE
ORAL_CAPSULE | Freq: Three times a day (TID) | ORAL | 0 refills | 0 days | Status: CP
Start: 2017-11-08 — End: 2018-02-27

## 2017-11-08 MED ORDER — ASPIRIN 81 MG TABLET,DELAYED RELEASE
ORAL_TABLET | Freq: Every day | ORAL | 0 refills | 0.00000 days | Status: CP
Start: 2017-11-08 — End: 2018-09-11

## 2018-02-27 ENCOUNTER — Ambulatory Visit
Admit: 2018-02-27 | Discharge: 2018-02-28 | Disposition: A | Discharge status: Home / Self Care | Payer: MEDICARE | Attending: Family Medicine

## 2018-02-27 MED ORDER — GABAPENTIN 300 MG CAPSULE
ORAL_CAPSULE | Freq: Three times a day (TID) | ORAL | 0 refills | 0 days | Status: CP
Start: 2018-02-27 — End: 2018-09-11

## 2018-02-27 MED ORDER — ACETAMINOPHEN 500 MG TABLET
Freq: Four times a day (QID) | ORAL | 0 refills | 0 days | PRN
Start: 2018-02-27 — End: ?

## 2018-02-27 MED ORDER — TIZANIDINE 4 MG TABLET
ORAL_TABLET | 0 refills | 0 days | Status: CP
Start: 2018-02-27 — End: ?

## 2018-02-27 MED ORDER — NAPROXEN 500 MG TABLET
ORAL_TABLET | Freq: Two times a day (BID) | ORAL | 0 refills | 0 days | Status: CP
Start: 2018-02-27 — End: ?

## 2018-06-23 DIAGNOSIS — M5116 Intervertebral disc disorders with radiculopathy, lumbar region: Principal | ICD-10-CM

## 2018-06-23 MED ORDER — METHYLPREDNISOLONE 4 MG TABLETS IN A DOSE PACK
PACK | 0 refills | 0 days | Status: CP
Start: 2018-06-23 — End: ?

## 2018-07-15 ENCOUNTER — Ambulatory Visit: Admit: 2018-07-15 | Discharge: 2018-07-15 | Payer: MEDICARE

## 2018-07-15 DIAGNOSIS — M5116 Intervertebral disc disorders with radiculopathy, lumbar region: Principal | ICD-10-CM

## 2018-08-01 ENCOUNTER — Telehealth
Admit: 2018-08-01 | Discharge: 2018-08-02 | Payer: MEDICARE | Attending: Physical Medicine & Rehabilitation | Primary: Physical Medicine & Rehabilitation

## 2018-08-01 DIAGNOSIS — M5116 Intervertebral disc disorders with radiculopathy, lumbar region: Principal | ICD-10-CM

## 2018-08-17 ENCOUNTER — Ambulatory Visit: Admit: 2018-08-17 | Discharge: 2018-08-17 | Payer: MEDICARE

## 2018-08-17 ENCOUNTER — Ambulatory Visit
Admit: 2018-08-17 | Discharge: 2018-08-17 | Payer: MEDICARE | Attending: Physical Medicine & Rehabilitation | Primary: Physical Medicine & Rehabilitation

## 2018-09-14 ENCOUNTER — Ambulatory Visit: Admit: 2018-09-14 | Discharge: 2018-09-14 | Payer: MEDICARE

## 2018-09-14 ENCOUNTER — Ambulatory Visit
Admit: 2018-09-14 | Discharge: 2018-09-14 | Payer: MEDICARE | Attending: Physical Medicine & Rehabilitation | Primary: Physical Medicine & Rehabilitation

## 2019-04-02 ENCOUNTER — Ambulatory Visit
Admit: 2019-04-02 | Discharge: 2019-04-04 | Disposition: A | Discharge status: Home / Self Care | Payer: MEDICARE | Source: Other Acute Inpatient Hospital

## 2019-04-02 ENCOUNTER — Ambulatory Visit
Admit: 2019-04-02 | Discharge: 2019-04-02 | Disposition: A | Discharge status: Other Acute Inpatient Hospital | Payer: MEDICARE

## 2019-04-02 ENCOUNTER — Emergency Department
Admit: 2019-04-02 | Discharge: 2019-04-02 | Disposition: A | Discharge status: Other Acute Inpatient Hospital | Payer: MEDICARE

## 2019-04-02 DIAGNOSIS — I213 ST elevation (STEMI) myocardial infarction of unspecified site: Principal | ICD-10-CM

## 2019-04-03 NOTE — Unmapped (Addendum)
[facility] Emergency Department Provider Note    ED Clinical Impression     Final diagnoses:   Chest pain, unspecified type (Primary)       ED Course, Assessment and Plan     Initial Clinical Impression:    April 02, 2019 7:55 PM     Glen Gross is a 54 y.o. male with a significant PMH of CABG x 4 stents (2018) presenting for evaluation of chest pain from [facility] ED.       BP 144/100  - Pulse 77  - Temp 37.2 ??C (99 ??F) (Oral)  - Resp 15  - SpO2 99%       On exam patient appears comfortable and in no distress.  Cardio exam with normal rate and rhythm.  Lungs clear to auscultation bilaterally with normal respiratory effort.  No abdominal tenderness to palpation.  Moves all extremities appropriately.  No lower extremity swelling or asymmetry.      MDM (differential diagnosis, diagnostic testing, treatment, and disposition):    From an ACS standpoint, the patient has a HEART score of 4 (moderately suspicious history, normal EKG, age 45-65, 1-2 risk factors, initial troponin 0.034 but hemolyzed) and has a reassuring EKG. Will further evaluate for this possibility with serial troponin levels to include a measurement drawn at least 6 hours from the onset of pain. From a pulmonary embolism standpoint, the patient has a Wells score of 0, satting at 97% on RA, and HR 69. Will evaluate for pneumonia and pneumothorax with a chest x-ray.  I have a low suspicion for aortic dissection given that pain is not ripping or radiating to the back and the patient does not appear to have a widened mediastinum on chest x-ray. No risk factors such as recent endoscopy or frequent heavy retching to suggest Borhaave Syndrome.  Will get basic labs and page cardiology for further evaluation.    ED Course:  Signed out to oncoming provider.  ED Course as of Apr 10 1728   Mon Apr 02, 2019   2054 Patient was discussed with cardiology service who is in agreement on patient's need for observation so we will plan for admission to cardiology for cardiac observation.          _____________________________________________________________________    The case was discussed with attending physician who is in agreement with the above assessment and plan    Additional Medical Decision Making     I have reviewed the vital signs and the nursing notes. Diagnostic studies including labs, EKG, and radiology results that were available during my care of the patient were independently reviewed by me and considered in my medical decision making.   I independently visualized the radiology images   I reviewed the patient's prior medical records if available.  Additional history obtained from family if available    History     CHIEF COMPLAINT:   Chief Complaint   Patient presents with   ??? Chest Pain       HPI: Glen Gross is a 54 y.o. male with a PMH of CABG x 4 stents (2018) who was transferred from [facility] ED to [facility] ED with acute chest pain. Chest pain occurred this morning around 10 AM while driving. Described chest pain as tightness and pressure.  Admit to shortness of breath and lightheadedness.  Denies fevers, abdominal pain, lower extremity swelling or asymmetry.  At [facility] ED he was given aspirin and nitro which relieved the chest discomfort.  Per signout from EMS  EKG showed STEMI and [facility] cardiology was consulted.  Of note this EKG is not to be found in epic at this time.  [facility] cardiology is aware and following.        PAST MEDICAL HISTORY/PAST SURGICAL HISTORY:   No past medical history on file.    No past surgical history on file.    MEDICATIONS:     Current Facility-Administered Medications:   ???  nitroglycerin (NITROSTAT) SL tablet 0.4 mg, 0.4 mg, Sublingual, Once, Robbi Garter, MD  No current outpatient medications on file.    ALLERGIES:   Patient has no known allergies.    SOCIAL HISTORY:   Social History     Tobacco Use   ??? Smoking status: Not on file   Substance Use Topics   ??? Alcohol use: Not on file       FAMILY HISTORY:  No family history on file.       Review of Systems    A 10 point review of systems was performed and is negative other than positive elements noted in HPI     Constitutional: Negative for fever, chills  Eyes: Negative for visual changes.  ENT: Negative for sore throat.  Cardiovascular:  Positive for chest pain.  Negative for palpitations.  Respiratory: Positive for shortness of breat.  Negative cough, or wheezing.  Gastrointestinal: Negative for abdominal pain, vomiting or diarrhea.  Genitourinary: Negative for dysuria.  Musculoskeletal:  Positive for back pain.  Skin: Negative for rash.  Neurological: Negative for headaches, focal weakness or numbness.    Physical Exam     VITAL SIGNS:    BP 144/100  - Pulse 77  - Temp 37.2 ??C (99 ??F) (Oral)  - Resp 15  - SpO2 99%     Constitutional: Alert and oriented. Well appearing and in no distress. No increased respiratory effort.   Eyes: Conjunctivae are normal. PERRL  ENT       Head: Normocephalic and atraumatic.       Nose: No congestion.       Mouth/Throat: Mucous membranes are moist.       Neck: No stridor.  Hematological/Lymphatic/Immunilogical: No cervical lymphadenopathy.  Cardiovascular: S1, S2,  Normal and symmetric distal pulses are present in all extremities.Warm and well perfused.   Respiratory: Normal respiratory effort. Breath sounds are normal.  Gastrointestinal: Soft and nontender. There is no CVA tenderness.  Musculoskeletal: Grossly normal range of motion in all extremities.       Right lower leg: No tenderness or edema.       Left lower leg: No tenderness or edema.  Neurologic: Normal speech and language. No gross focal neurologic deficits are appreciated. No gross asymmetrical weakness   Skin: Skin is warm, dry and intact. No rash noted.  Psychiatric: Mood and affect are normal. Speech and behavior are normal.    Radiology     CTA Chest/Abd (Aortic Dissection)   Final Result      No aortic dissection.            XR Chest 2 views   Final Result   Nodular pulmonary parenchymal opacities in the right lung base may represent developing infection. Recommend follow-up chest x-ray 3-4 weeks to document resolution          Labs     Labs Reviewed   TROPONIN I - Abnormal; Notable for the following components:       Result Value    Troponin I 0.034 (*)  All other components within normal limits   CBC W/ AUTO DIFF - Abnormal; Notable for the following components:    RDW 15.3 (*)     Macrocytosis Slight (*)     All other components within normal limits    Narrative:     Please use the Absolute Differential for reference ranges.    POTASSIUM - Normal   AST - Normal   ALKALINE PHOSPHATASE - Normal   PROTEIN, TOTAL - Normal   BILIRUBIN, TOTAL - Normal   ALBUMIN - Normal   CBC W/ DIFFERENTIAL    Narrative:     The following orders were created for panel order CBC w/ Differential.  Procedure                               Abnormality         Status                     ---------                               -----------         ------                     CBC w/ Differential[[id number]]         Abnormal            Final result                 Please view results for these tests on the individual orders.   COMPREHENSIVE METABOLIC PANEL       EKG: Normal sinus rhythm and heart rate. Normal axis. Normal PR, prolonged QT, normal QRS duration/intervals. No ST elevation or depressions to suggest ischemia. No T wave abnormalities.  No prior EKGs to compare.    Pertinent labs & imaging results that were available during my care of the patient were reviewed by me and considered in my medical decision making (see chart for details).    Please note- This chart has been created using [employer]. Chart creation errors have been sought, but may not always be located and such creation errors, especially pronoun confusion, do NOT reflect on the standard of medical care.      Barbette Reichmann, DO  [facility] Family Medicine, PGY-1  April 02, 2019 7:55 PM           Sherald Barge, DO  Resident  04/02/19 2015       Sherald Barge, DO  Resident  04/02/19 2016

## 2019-04-04 MED ORDER — EVOLOCUMAB 140 MG/ML SUBCUTANEOUS PEN INJECTOR
INJECTION | SUBCUTANEOUS | 2 refills | 0 days | Status: CP
Start: 2019-04-04 — End: 2019-05-04
  Filled 2019-04-11: qty 2, 28d supply, fill #0

## 2019-04-04 MED ORDER — METOPROLOL SUCCINATE ER 25 MG TABLET,EXTENDED RELEASE 24 HR
ORAL_TABLET | Freq: Every day | ORAL | 1 refills | 90.00000 days | Status: CP
Start: 2019-04-04 — End: ?
  Filled 2019-04-04: qty 45, 90d supply, fill #0

## 2019-04-04 MED ORDER — NICOTINE (POLACRILEX) 4 MG BUCCAL LOZENGE
LOZENGE | BUCCAL | 3 refills | 3 days | Status: CP | PRN
Start: 2019-04-04 — End: ?

## 2019-04-04 MED ORDER — NICOTINE (POLACRILEX) 4 MG GUM
BUCCAL | 3 refills | 5 days | Status: CP | PRN
Start: 2019-04-04 — End: ?
  Filled 2019-04-04: qty 42, 42d supply, fill #0

## 2019-04-04 MED ORDER — NICOTINE 14 MG/24 HR DAILY TRANSDERMAL PATCH
MEDICATED_PATCH | TRANSDERMAL | 0 refills | 42 days | Status: CP
Start: 2019-04-04 — End: 2019-05-16

## 2019-04-04 MED FILL — NICOTINE 14 MG/24 HR DAILY TRANSDERMAL PATCH: 42 days supply | Qty: 42 | Fill #0 | Status: AC

## 2019-04-04 MED FILL — ATORVASTATIN 80 MG TABLET: 90 days supply | Qty: 90 | Fill #0 | Status: AC

## 2019-04-04 MED FILL — METOPROLOL SUCCINATE ER 25 MG TABLET,EXTENDED RELEASE 24 HR: 90 days supply | Qty: 45 | Fill #0 | Status: AC

## 2019-04-04 MED FILL — ASPIRIN 81 MG CHEWABLE TABLET: 108 days supply | Qty: 108 | Fill #0 | Status: AC

## 2019-04-04 MED FILL — CLOPIDOGREL 75 MG TABLET: 90 days supply | Qty: 90 | Fill #0 | Status: AC

## 2019-04-05 DIAGNOSIS — E785 Hyperlipidemia, unspecified: Principal | ICD-10-CM

## 2019-04-05 MED ORDER — ATORVASTATIN 80 MG TABLET
ORAL_TABLET | Freq: Every day | ORAL | 3 refills | 90 days | Status: CP
Start: 2019-04-05 — End: ?
  Filled 2019-04-04: qty 90, 90d supply, fill #0

## 2019-04-05 MED ORDER — CLOPIDOGREL 75 MG TABLET
ORAL_TABLET | Freq: Every day | ORAL | 3 refills | 90.00000 days | Status: CP
Start: 2019-04-05 — End: ?
  Filled 2019-04-04: qty 90, 90d supply, fill #0

## 2019-04-05 MED ORDER — ASPIRIN 81 MG CHEWABLE TABLET
ORAL_TABLET | Freq: Every day | ORAL | 3 refills | 108.00000 days | Status: CP
Start: 2019-04-05 — End: ?
  Filled 2019-04-04: qty 108, 108d supply, fill #0

## 2019-04-06 LAB — HEMOGLOBIN A1C
HEMOGLOBIN A1C: 5.3 % (ref 4.8–5.6)
Hemoglobin A1c/Hemoglobin.total:MFr:Pt:Bld:Qn:: 5.3

## 2019-04-06 NOTE — Unmapped (Signed)
[facility] Specialty Medication Onboarding    Specialty Medication: REPATHA SURECLICK 140MG /ML PENS  Prior Authorization: Approved   Financial Assistance: No - copay  <$25  Final Copay/Day Supply: $4 / 28 DAYS    Insurance Restrictions: Yes - max 1 month supply     Notes to Pharmacist:     The triage team has completed the benefits investigation and has determined that the patient is able to fill this medication at [facility]. Please contact the patient to complete the onboarding or follow up with the prescribing physician as needed.

## 2019-04-06 NOTE — Unmapped (Signed)
This encounter was created in error - please disregard.

## 2019-04-10 MED ORDER — EMPTY CONTAINER
3 refills | 0 days
Start: 2019-04-10 — End: ?

## 2019-04-10 NOTE — Unmapped (Signed)
[facility]   Patient Onboarding/Medication Counseling    Glen Gross is a 54 y.o. male with hyperlipidemia who I am counseling today on initiation of therapy.  I am speaking to the patient.    Was a Nurse, learning disability used for this call? No    Verified patient's date of birth / HIPAA.    Specialty medication(s) to be sent: General Specialty: Repatha      Non-specialty medications/supplies to be sent: sharps      Medications not needed at this time: none         Repatha (evolocumab)    Medication & Administration     Dosage: Inject the contents of 1 pen (140mg ) under the skin every 2 weeks.    Administration: Administer under the skin of the abdomen, thigh or upper arm. Rotate sites with each injection.  ? Injection instructions - Autoinjector:  o Remove 1 Repatha autoinjector from the refrigerator and let stand at room temperature for at least 30 minutes.  o Check the autoinjector for the following:  ? Expiration date  ? Absence of any cracks or damage  ? The medicine is clear and colorless and does not contain any particles  ? The orange cap is present and securely attached  o Choose your injection site and clean with an alcohol wipe. Allow to air dry completely.  o Pull the orange cap straight off and discard  o Pinch the skin (or stretch) with your thumb and fingers creating an area 2 inches wide  o Maintaining the pinch (or stretch) press the pen to your skin at a 90 degree angle. Firmly push the autoinjector down until the skin stops moving and the yellow safety guard is no longer visible.  o Do not touch the gray start button yet  o When you are ready to inject, press the gray start button. You will hear a click that signals the start of the injection  o Continue to press the pen to your skin and lift your thumb  o The injection may take up to 15 seconds. You will know the injection is complete when the medication window turns yellow. You may also hear a second click.  o Remove the pen from your skin and discard the pen in a sharps container.  o If there is blood at the injection site, press a cotton ball or gauze to the site. Do not rub the injection site.    Adherence/Missed dose instructions: Administer a missed dose within 7 days and resume your normal schedule.  If it has been more than 7 days and you inject every 2 weeks, skip the missed dose and resume your normal schedule..     Goals of Therapy     Lower cholesterol, prevention of cardiovascular events in patients with established cardiovascular disease    Side Effects & Monitoring Parameters   ? Flu-like symptoms  ? Signs of a common cold  ? Back pain  ? Injection site irritation  ? Nose or throat irritation    The following side effects should be reported to the provider:  ? Signs of an allergic reaction  ? Signs of high blood sugar (confusion, drowsiness, increase thirst/hunger/urination, fast breathing, flushing)      Contraindications, Warnings, & Precautions     ? Latex (the packaging of Repatha may contain natural rubber)    Drug/Food Interactions     ? Medication list reviewed in Epic. The patient was instructed to inform the care team before  taking any new medications or supplements. No drug interactions identified.     Storage, Handling Precautions, & Disposal   ? Repatha should be stored in the refrigerator. If necessary, Repatha may be kept at room temperature for no more than 30 days.  ? Place used devices in a sharps container for disposal.      Current Medications (including OTC/herbals), Comorbidities and Allergies     Current Outpatient Medications   Medication Sig Dispense Refill   ??? acetaminophen (TYLENOL) 500 MG tablet Take 2 tablets (1,000 mg total) by mouth every six (6) hours as needed.  0   ??? aspirin (ECOTRIN) 81 MG tablet Take 1 tablet (81 mg total) by mouth daily. 30 tablet 0   ??? aspirin 81 MG chewable tablet Chew 1 tablet (81 mg total) daily. 108 tablet 3   ??? atorvastatin (LIPITOR) 40 MG tablet Take 1 tablet (40 mg total) by mouth daily. 30 tablet 0   ??? atorvastatin (LIPITOR) 80 MG tablet Take 1 tablet (80 mg total) by mouth daily. 90 tablet 3   ??? carvedilol (COREG) 6.25 MG tablet Take 1 tablet (6.25 mg total) by mouth Two (2) times a day. 60 tablet 0   ??? clopidogreL (PLAVIX) 75 mg tablet Take 1 tablet (75 mg total) by mouth daily. 90 tablet 3   ??? evolocumab 140 mg/mL PnIj Inject the contents of 1 pen (140 mg) under the skin every fourteen (14) days. 2 mL 2   ??? gabapentin (NEURONTIN) 300 MG capsule Take 1 capsule (300 mg total) by mouth Three (3) times a day. (Patient taking differently: Take 600 mg by mouth two (2) times a day. ) 60 capsule 0   ??? gabapentin (NEURONTIN) 300 MG capsule Take 600 mg by mouth Three (3) times a day.      ??? lisinopriL (PRINIVIL,ZESTRIL) 10 MG tablet Take 10 mg by mouth daily.     ??? methylPREDNISolone (MEDROL DOSEPACK) 4 mg tablet follow package directions 1 Package 0   ??? metoprolol succinate (TOPROL XL) 25 MG 24 hr tablet Take 1/2 tablet (12.5 mg) by mouth daily. 45 tablet 1   ??? naproxen (NAPROSYN) 500 MG tablet Take 1 tablet (500 mg total) by mouth 2 (two) times a day with meals. As needed for pain 30 tablet 0   ??? nicotine (NICODERM CQ) 14 mg/24 hr patch Place 1 patch on the skin daily for 42 doses. On quit day begin by applying 14-mg patch daily for six weeks 42 patch 0   ??? nicotine polacrilex (NICORETTE MINI) lozenge 4 MG Apply 1 lozenge (4 mg total) to cheek every 1-2 hours as needed for smoking cessation. 72 lozenge 3   ??? nicotine polacrilex (NICORETTE) 4 MG gum Apply 1 each (4 mg total) to cheek every hour as needed for smoking cessation. Weeks 1-6: Chew 1 piece of gum every 1-2 hours as needed 110 each 3   ??? oxyCODONE (ROXICODONE) 10 mg immediate release tablet Take 10 mg by mouth 4 (four) times a day as needed.     ??? tiZANidine (ZANAFLEX) 4 MG tablet Instructions 1/2-1 tablet up to every 8 hours as needed for muscle spasm (Patient not taking: Reported on 07/31/2018) 20 tablet 0     No current facility-administered medications for this visit.        No Known Allergies    There is no problem list on file for this patient.      Reviewed and up to date in Epic.  Appropriateness of Therapy     Is medication and dose appropriate based on diagnosis? Yes    Prescription has been clinically reviewed: Yes    Baseline Quality of Life Assessment      How many days over the past month did your hyperlipidemia  keep you from your normal activities? For example, brushing your teeth or getting up in the morning. 0    Financial Information     Medication Assistance provided: Prior Authorization    Anticipated copay of $4/28 days reviewed with patient. Verified delivery address.    Delivery Information     Scheduled delivery date: 04/12/19    Expected start date: 04/12/19    Medication will be delivered via UPS to the prescription address in [facility].  This shipment will not require a signature.      Explained the services we provide at [facility] and that each month we would call to set up refills.  Stressed importance of returning phone calls so that we could ensure they receive their medications in time each month.  Informed patient that we should be setting up refills 7-10 days prior to when they will run out of medication.  A pharmacist will reach out to perform a clinical assessment periodically.  Informed patient that a welcome packet and a drug information handout will be sent.      Patient verbalized understanding of the above information as well as how to contact the pharmacy at [phone] option 4 with any questions/concerns.  The pharmacy is open Monday through Friday 8:30am-4:30pm.  A pharmacist is available 24/7 via pager to answer any clinical questions they may have.    Patient Specific Needs     ? Does the patient have any physical, cognitive, or cultural barriers? No    ? Patient prefers to have medications discussed with  Patient     ? Is the patient or caregiver able to read and understand education materials at a high school level or above? Yes    ? Patient's primary language is  English     ? Is the patient high risk? No     ? Does the patient require a Care Management Plan? No     ? Does the patient require physician intervention or other additional services (i.e. nutrition, smoking cessation, social work)? No      Arnold Long  [facility] Specialty Pharmacist

## 2019-04-11 MED FILL — EMPTY CONTAINER: 120 days supply | Qty: 1 | Fill #0 | Status: AC

## 2019-04-11 MED FILL — EMPTY CONTAINER: 120 days supply | Qty: 1 | Fill #0

## 2019-04-11 MED FILL — REPATHA SURECLICK 140 MG/ML SUBCUTANEOUS PEN INJECTOR: 28 days supply | Qty: 2 | Fill #0 | Status: AC

## 2019-04-20 ENCOUNTER — Telehealth (HOSPITAL_COMMUNITY): Payer: Self-pay | Admitting: *Deleted

## 2019-04-20 NOTE — Telephone Encounter (Signed)
Received follow up phone call from patient and [facility] RN.  Permission given by patient in agreement to proceed with the 3 way telephone call.  Pt would like to schedule for cardiac rehab because of this hospitalization at [facility] for chest pain.  Asked pt in follow up from our conversation last week regarding the steps that was needed in order to facilitate this request.  Questioned pt of whether he was seen on this Monday by his PCP at [facility].  Pt indicated that he had.  Asked if pt was then referred to a cardiologist.  Pt indicated that he was seen by Dr. Hanley Hays on this past Wednesday at the [place] office.  Pt indicted that he was seen however Dr. Hanley Hays did not have any of the records from [facility].  Request was made again and pt signed given permission for his medical records to be sent for Dr. Hanley Hays to review from [facility].  Indicated to pt now that since he has established care, I can send a cardiac rehab and medicaid reimbursement form for Dr. Hanley Hays to sign and request office notes that I do not have access to review.  Once this information was received and reviewed for  pt appropriateness and stability by his MD, he will be contacted for scheduling.   Pt and [employer] medic[employer] RN verbalized understanding. Alanson Aly, BSN Cardiac and Emergency planning/management officer

## 2019-05-01 NOTE — Unmapped (Signed)
[facility] Clinical Assessment & Refill Coordination Note    Amaury Kuzel, [place]: 1966-01-31  Phone: [phone] (home)     All above HIPAA information was verified with patient.     Was a Nurse, learning disability used for this call? No    Specialty Medication(s):   General Specialty: Repatha     Current Outpatient Medications   Medication Sig Dispense Refill   ??? acetaminophen (TYLENOL) 500 MG tablet Take 2 tablets (1,000 mg total) by mouth every six (6) hours as needed.  0   ??? aspirin (ECOTRIN) 81 MG tablet Take 1 tablet (81 mg total) by mouth daily. 30 tablet 0   ??? aspirin 81 MG chewable tablet Chew 1 tablet (81 mg total) daily. 108 tablet 3   ??? atorvastatin (LIPITOR) 40 MG tablet Take 1 tablet (40 mg total) by mouth daily. 30 tablet 0   ??? atorvastatin (LIPITOR) 80 MG tablet Take 1 tablet (80 mg total) by mouth daily. 90 tablet 3   ??? carvedilol (COREG) 6.25 MG tablet Take 1 tablet (6.25 mg total) by mouth Two (2) times a day. 60 tablet 0   ??? clopidogreL (PLAVIX) 75 mg tablet Take 1 tablet (75 mg total) by mouth daily. 90 tablet 3   ??? empty container Misc Use as directed to dispose of injectable medications 1 each 3   ??? evolocumab 140 mg/mL PnIj Inject the contents of 1 pen (140 mg) under the skin every fourteen (14) days. 2 mL 2   ??? gabapentin (NEURONTIN) 300 MG capsule Take 1 capsule (300 mg total) by mouth Three (3) times a day. (Patient taking differently: Take 600 mg by mouth two (2) times a day. ) 60 capsule 0   ??? gabapentin (NEURONTIN) 300 MG capsule Take 600 mg by mouth Three (3) times a day.      ??? lisinopriL (PRINIVIL,ZESTRIL) 10 MG tablet Take 10 mg by mouth daily.     ??? methylPREDNISolone (MEDROL DOSEPACK) 4 mg tablet follow package directions 1 Package 0   ??? metoprolol succinate (TOPROL XL) 25 MG 24 hr tablet Take 1/2 tablet (12.5 mg) by mouth daily. 45 tablet 1   ??? naproxen (NAPROSYN) 500 MG tablet Take 1 tablet (500 mg total) by mouth 2 (two) times a day with meals. As needed for pain 30 tablet 0   ??? nicotine (NICODERM CQ) 14 mg/24 hr patch Place 1 patch on the skin daily for 42 doses. On quit day begin by applying 14-mg patch daily for six weeks 42 patch 0   ??? nicotine polacrilex (NICORETTE MINI) lozenge 4 MG Apply 1 lozenge (4 mg total) to cheek every 1-2 hours as needed for smoking cessation. 72 lozenge 3   ??? nicotine polacrilex (NICORETTE) 4 MG gum Apply 1 each (4 mg total) to cheek every hour as needed for smoking cessation. Weeks 1-6: Chew 1 piece of gum every 1-2 hours as needed 110 each 3   ??? oxyCODONE (ROXICODONE) 10 mg immediate release tablet Take 10 mg by mouth 4 (four) times a day as needed.     ??? tiZANidine (ZANAFLEX) 4 MG tablet Instructions 1/2-1 tablet up to every 8 hours as needed for muscle spasm (Patient not taking: Reported on 07/31/2018) 20 tablet 0     No current facility-administered medications for this visit.         Changes to medications: Joandry reports no changes at this time. Mr. Boissonneault states prior to discharge he was asked to discontinue his  naproxen. He said he was in too much pain and wanted to restart. I informed him that this had more to do with his CV complications and less to do with drug interactions and I wouldn't recommend him restart the medication without talking to his cardiologist. He did not seem inclined to wait but did say he had an appt tomorrow.    No Known Allergies    Changes to allergies: No    SPECIALTY MEDICATION ADHERENCE     Repatha 140 mg/ml: 0 days of medicine on hand       Medication Adherence    Patient reported X missed doses in the last month: 0  Specialty Medication: Repatha  Patient is on additional specialty medications: No  Informant: patient          Specialty medication(s) dose(s) confirmed: Regimen is correct and unchanged.     Are there any concerns with adherence? No    Adherence counseling provided? Not needed    CLINICAL MANAGEMENT AND INTERVENTION      Clinical Benefit Assessment:    Do you feel the medicine is effective or helping your condition? Unable to determine at this time - blood work has not been done since starting    Clinical Benefit counseling provided? Not needed    Adverse Effects Assessment:    Are you experiencing any side effects? Yes, patient reports experiencing flu like symptoms. Side effect counseling provided: suggested using injection prior to bedtime; take APAP 30-60 minutes prior to injection    Are you experiencing difficulty administering your medicine? No    Quality of Life Assessment:    How many days over the past month did your hyperlipidemia  keep you from your normal activities? For example, brushing your teeth or getting up in the morning. 0    Have you discussed this with your provider? Not needed    Therapy Appropriateness:    Is therapy appropriate? Yes, therapy is appropriate and should be continued    DISEASE/MEDICATION-SPECIFIC INFORMATION      For patients on injectable medications: Patient currently has 0 doses left.  Next injection is scheduled for 05/10/19.    PATIENT SPECIFIC NEEDS     ? Does the patient have any physical, cognitive, or cultural barriers? No    ? Is the patient high risk? No     ? Does the patient require a Care Management Plan? No     ? Does the patient require physician intervention or other additional services (i.e. nutrition, smoking cessation, social work)? No      SHIPPING     Specialty Medication(s) to be Shipped:   General Specialty: Repatha    Other medication(s) to be shipped: none     Changes to insurance: No    Delivery Scheduled: Yes, Expected medication delivery date: 05/04/19.     Medication will be delivered via UPS to the confirmed prescription address in [facility].    The patient will receive a drug information handout for each medication shipped and additional FDA Medication Guides as required.  Verified that patient has previously received a Conservation officer, historic buildings.    All of the patient's questions and concerns have been addressed. Arnold Long   [facility] Specialty Pharmacist

## 2019-05-03 MED FILL — REPATHA SURECLICK 140 MG/ML SUBCUTANEOUS PEN INJECTOR: 28 days supply | Qty: 2 | Fill #1 | Status: AC

## 2019-05-03 MED FILL — REPATHA SURECLICK 140 MG/ML SUBCUTANEOUS PEN INJECTOR: SUBCUTANEOUS | 28 days supply | Qty: 2 | Fill #1

## 2019-05-10 NOTE — Unmapped (Deleted)
DIVISION OF CARDIOLOGY   [employer] [place]        Date of Service: 05/16/2019      PCP: Referring Provider:   Paschal Dopp, PAC  [address] [place] [address]  Phone: [phone]  Fax: [phone] Mylinda Latina, PAC  [address] [place],  [place] [address]  Phone: [phone]  Fax: [phone]       Impression and Plan:  Glen Gross is a 54 y.o. male with a history of CAD s/p CABG??(12/2015??in [place])??, HTN, HLD, pre-diabetes,??tobacco use disorder, and degenerative disc disease referred to the cardiology clinic at the [facility] by Paschal Dopp, [facility] for evaluation of hospital f/u.     1. CAD    2. HTN    3. Hyperlipidemia  LDL 194 on 04/02/19     Followup: No follow-ups on file.      History of Present Illness:  Glen Gross is a 54 y.o. male with a history of CAD s/p CABG??(12/2015??in [place])??, HTN, HLD, pre-diabetes,??tobacco use disorder, and degenerative disc disease referred to the cardiology clinic at the [facility] by Paschal Dopp, [facility] for evaluation of hospital f/u.     ***Mr. Badolato presents today in clinic.      Past Medical History:  1. ***    Past Medical History:  Past Medical History:   Diagnosis Date   ??? Abnormal ECG    ??? Arthritis    ??? Asthma    ??? CHF (congestive heart failure) (CMS-HCC)    ??? Coronary artery disease    ??? Hyperlipidemia    ??? Hypertension        Past Surgical History:  Past Surgical History:   Procedure Laterality Date   ??? CORONARY ARTERY BYPASS GRAFT  12/2015    in [place]   ??? CORONARY ARTERY BYPASS GRAFT     ??? PR CATH PLACE/CORON ANGIO, IMG SUPER/INTERP,W LEFT HEART VENTRICULOGRAPHY N/A 04/03/2019    Procedure: Left Heart Catheterization with possible intervention;  Surgeon: Lesle Reek, MD;  Location: [facility] CATH;  Service: Cardiology       Problem List:  There is no problem list on file for this patient.      Most Recent Imaging:    LHC 04/03/19  1. Severe diffuse 3v native CAD  2. 4 of 4 patent bypass grafts (SVG to OM1, SVG to OM2, SVG to rPDA, and LIMA to LAD)    Echo 04/03/19    1. The left ventricle is normal in size with moderately increased wall thickness.    2. The left ventricular systolic function is normal, LVEF is visually estimated at > 55%.    3. There is grade I diastolic dysfunction (impaired relaxation).    4. There is mild aortic regurgitation.    5. The aorta at the sinuses of Valsalva is mildly dilated.    6. The right ventricle is normal in size, with normal systolic function.    PVL Lower Extremity 04/04/19  Right: Arterial obstruction involving the SFA and/or popliteal artery, and tibioperoneal vessels. The disease is likely to produce symptoms with exercise.  Left: Arterial obstruction involving the SFA and/or popliteal artery and tibioperoneal vessels.  PVL Carotid 04/04/19  Right Carotid: Plaque noted in the ECA. There was no evidence of thrombus, dissection, atherosclerotic plaque or stenosis in the cervical carotid system.  Left Carotid: The extracranial vessels were near-normal with only  minimal wall thickening or plaque.  Vertebrals:  Both vertebral arteries were patent with antegrade flow.  Subclavians: Normal flow hemodynamics were seen in bilateral subclavian arteries.    ECG 04/02/19  NORMAL SINUS RHYTHM  PROLONGED QT     Current Medications:  Current Outpatient Medications   Medication Sig Dispense Refill   ??? acetaminophen (TYLENOL) 500 MG tablet Take 2 tablets (1,000 mg total) by mouth every six (6) hours as needed.  0   ??? aspirin (ECOTRIN) 81 MG tablet Take 1 tablet (81 mg total) by mouth daily. 30 tablet 0   ??? aspirin 81 MG chewable tablet Chew 1 tablet (81 mg total) daily. 108 tablet 3   ??? atorvastatin (LIPITOR) 40 MG tablet Take 1 tablet (40 mg total) by mouth daily. 30 tablet 0   ??? atorvastatin (LIPITOR) 80 MG tablet Take 1 tablet (80 mg total) by mouth daily. 90 tablet 3   ??? carvedilol (COREG) 6.25 MG tablet Take 1 tablet (6.25 mg total) by mouth Two (2) times a day. 60 tablet 0   ??? clopidogreL (PLAVIX) 75 mg tablet Take 1 tablet (75 mg total) by mouth daily. 90 tablet 3   ??? empty container Misc Use as directed to dispose of injectable medications 1 each 3   ??? evolocumab 140 mg/mL PnIj Inject the contents of 1 pen (140 mg) under the skin every fourteen (14) days. 2 mL 2   ??? gabapentin (NEURONTIN) 300 MG capsule Take 1 capsule (300 mg total) by mouth Three (3) times a day. (Patient taking differently: Take 600 mg by mouth two (2) times a day. ) 60 capsule 0   ??? gabapentin (NEURONTIN) 300 MG capsule Take 600 mg by mouth Three (3) times a day.      ??? lisinopriL (PRINIVIL,ZESTRIL) 10 MG tablet Take 10 mg by mouth daily.     ??? methylPREDNISolone (MEDROL DOSEPACK) 4 mg tablet follow package directions 1 Package 0   ??? metoprolol succinate (TOPROL XL) 25 MG 24 hr tablet Take 1/2 tablet (12.5 mg) by mouth daily. 45 tablet 1   ??? naproxen (NAPROSYN) 500 MG tablet Take 1 tablet (500 mg total) by mouth 2 (two) times a day with meals. As needed for pain 30 tablet 0   ??? nicotine (NICODERM CQ) 14 mg/24 hr patch Place 1 patch on the skin daily for 42 doses. On quit day begin by applying 14-mg patch daily for six weeks 42 patch 0   ??? nicotine polacrilex (NICORETTE MINI) lozenge 4 MG Apply 1 lozenge (4 mg total) to cheek every 1-2 hours as needed for smoking cessation. 72 lozenge 3   ??? nicotine polacrilex (NICORETTE) 4 MG gum Apply 1 each (4 mg total) to cheek every hour as needed for smoking cessation. Weeks 1-6: Chew 1 piece of gum every 1-2 hours as needed 110 each 3   ??? oxyCODONE (ROXICODONE) 10 mg immediate release tablet Take 10 mg by mouth 4 (four) times a day as needed.     ??? tiZANidine (ZANAFLEX) 4 MG tablet Instructions 1/2-1 tablet up to every 8 hours as needed for muscle spasm (Patient not taking: Reported on 07/31/2018) 20 tablet 0     No current facility-administered medications for this visit.        Allergies:  No Known Allergies    Review Of Systems:  Twelve system review of systems was completed and was non-contributory except as noted above, in the history of present illness.    Family History:  His  family history includes Asthma in his father; Diabetes in his father, mother, sister, and sister; Heart attack in his brother; Heart disease in his father, mother, sister, and sister; Heart failure in his father, mother, and sister; Hypertension in his father, mother, and sister; Osteoporosis in his father and mother.    Social History:  He  reports that he has been smoking cigarettes. He has a 2.00 pack-year smoking history. He has never used smokeless tobacco. He reports previous alcohol use. He reports current drug use. Drug: Marijuana.  Social History     Tobacco Use   Smoking Status Current Every Day Smoker   ??? Packs/day: 0.50   ??? Years: 4.00   ??? Pack years: 2.00   ??? Types: Cigarettes   Smokeless Tobacco Never Used   Tobacco Comment    pt smokes 10cpd, pt expressed desire to become tobacco free (receptive to NRT)      Social History     Substance and Sexual Activity   Alcohol Use Not Currently     Social History     Substance and Sexual Activity   Drug Use Yes   ??? Types: Marijuana       Physical Exam:    There were no vitals taken for this visit.    Wt Readings from Last 3 Encounters:   04/03/19 93 kg (205 lb)   04/02/19 93.9 kg (207 lb)   02/27/18 86.2 kg (190 lb)       General Appearance:  Alert, cooperative, no distress, appears stated age     HEENT:  Unremarkable   Neck: Carotids 2+ bilaterally, no carotid bruits,  *** JVD, no HJR.  Central venous pressure is normal.        Lungs:   Clear to auscultation bilaterally, respirations unlabored     Heart:  ***Regular rate and rhythm  Normal S1 and S2   *** murmurs/rubs/gallops     Abdomen:   Soft, non-tender, bowel sounds active,  no masses, no organomegaly     Extremities: Extremities normal, no cyanosis or edema.     Pulses: PT's 2+ symmetric bilaterally     Skin: No lower extremity rashes or ulcers Neurologic: Alert, interactive, and appropriate, grossly moving all 4 extremities        LABS:         Component Value Date/Time    PROBNP 354.0 (H) 04/03/2019 0050       Lab Results   Component Value Date    WBC 7.8 04/02/2019    HGB 15.3 04/02/2019    HCT 45.2 04/02/2019    PLT 239 04/02/2019       Lab Results   Component Value Date    NA 136 04/04/2019    K 4.1 04/04/2019    CL 103 04/04/2019    CO2 27.0 04/04/2019    BUN 18 04/04/2019    CREATININE 1.18 04/04/2019    GLU 110 04/04/2019    CALCIUM 9.8 04/04/2019    MG 1.9 04/04/2019       Lab Results   Component Value Date    TSH 2.610 04/02/2019    ALBUMIN 4.0 04/02/2019    ALT 18 04/02/2019    AST 26 04/02/2019    INR 1.15 04/02/2019         Scribe Attestation:         This document serves as a record of the services and decisions performed by Molli Hazard A. Hessie Dibble, MD on 05/16/2019. It was created on his  behalf by Inez Pilgrim, a trained medical scribe. The creation of this document is based on the provider's statements and observations that were conveyed to the medical scribe during the patient's encounter.     (The information in this document, created by the medical scribe for me, accurately reflects the services I personally performed and the decisions made by me. I have reviewed and approved this document for accuracy.)    I personally spent *** minutes face-to-face and non-face-to-face in the care of this patient, which includes all pre, intra, and post visit time on the date of service.      Artist Pais Hessie Dibble, MD, MPH  Assistant Professor of Medicine  [facility] [employer] [facility]  [email or web address]   -    Office [phone]

## 2019-05-23 NOTE — Unmapped (Signed)
[facility] Refill Coordination Note    Specialty Medication(s) to be Shipped:   General Specialty: Repatha    Other medication(s) to be shipped: n/a     Glen Gross, DOB: May 31, 1965  Phone: [phone] (home)       All above HIPAA information was verified with patient.     Was a Nurse, learning disability used for this call? No    Completed refill call assessment today to schedule patient's medication shipment from the [facility] [phone]).       Specialty medication(s) and dose(s) confirmed: Regimen is correct and unchanged.   Changes to medications: Glen Gross reports no changes at this time.  Changes to insurance: No  Questions for the pharmacist: No    Confirmed patient received Welcome Packet with first shipment. The patient will receive a drug information handout for each medication shipped and additional FDA Medication Guides as required.       DISEASE/MEDICATION-SPECIFIC INFORMATION        For patients on injectable medications: Patient currently has 1 doses left.  Next injection is scheduled for 3/26.    SPECIALTY MEDICATION ADHERENCE     Medication Adherence    Specialty Medication: repatha 140mg /ml  Patient is on additional specialty medications: No  Patient is on more than two specialty medications: No  Any gaps in refill history greater than 2 weeks in the last 3 months: no  Demonstrates understanding of importance of adherence: yes  Informant: patient                Repatha 140mg /ml: Patient has 14 days of medication on hand       SHIPPING     Shipping address confirmed in Epic.     Delivery Scheduled: Yes, Expected medication delivery date: 4/7.     Medication will be delivered via UPS to the prescription address in Epic WAM.    Glen Gross   [facility] Specialty Technician

## 2019-06-05 MED FILL — REPATHA SURECLICK 140 MG/ML SUBCUTANEOUS PEN INJECTOR: 28 days supply | Qty: 2 | Fill #2 | Status: AC

## 2019-06-05 MED FILL — REPATHA SURECLICK 140 MG/ML SUBCUTANEOUS PEN INJECTOR: SUBCUTANEOUS | 28 days supply | Qty: 2 | Fill #2

## 2019-06-20 ENCOUNTER — Other Ambulatory Visit: Payer: Self-pay | Admitting: *Deleted

## 2019-06-20 DIAGNOSIS — I739 Peripheral vascular disease, unspecified: Secondary | ICD-10-CM

## 2019-06-26 ENCOUNTER — Inpatient Hospital Stay (HOSPITAL_COMMUNITY): Admission: RE | Admit: 2019-06-26 | Payer: Medicare HMO | Source: Ambulatory Visit

## 2019-06-26 ENCOUNTER — Encounter: Payer: Medicare HMO | Admitting: Vascular Surgery

## 2019-06-26 DIAGNOSIS — E785 Hyperlipidemia, unspecified: Principal | ICD-10-CM

## 2019-06-26 MED ORDER — REPATHA SURECLICK 140 MG/ML SUBCUTANEOUS PEN INJECTOR
SUBCUTANEOUS | 2 refills | 28 days | Status: CP
Start: 2019-06-26 — End: 2019-07-26
  Filled 2019-07-04: qty 2, 28d supply, fill #0

## 2019-06-26 NOTE — Unmapped (Signed)
[facility] Refill Coordination Note    Specialty Medication(s) to be Shipped:   General Specialty: Repatha    Other medication(s) to be shipped: N/A     Glen Gross, DOB: [birth year]  Phone: [phone] (home)       All above HIPAA information was verified with patient.     Was a Nurse, learning disability used for this call? No    Completed refill call assessment today to schedule patient's medication shipment from the [facility] [phone]).       Specialty medication(s) and dose(s) confirmed: Regimen is correct and unchanged.   Changes to medications: Jj reports no changes at this time.  Changes to insurance: No  Questions for the pharmacist: No    Confirmed patient received Welcome Packet with first shipment. The patient will receive a drug information handout for each medication shipped and additional FDA Medication Guides as required.       DISEASE/MEDICATION-SPECIFIC INFORMATION        For patients on injectable medications: Patient currently has 0 doses left.  Next injection is scheduled for 07/06/19.    SPECIALTY MEDICATION ADHERENCE     Medication Adherence    Patient reported X missed doses in the last month: 0  Specialty Medication: Repatha  Patient is on additional specialty medications: No                Repatha 140 mg/ml: 0 days of medicine on hand       SHIPPING     Shipping address confirmed in Epic.     Delivery Scheduled: Yes, Expected medication delivery date: 07/03/19.  However, Rx request for refills was sent to the provider as there are none remaining.     Medication will be delivered via UPS to the prescription address in Epic WAM.    Nancy Nordmann [facility] Specialty Technician

## 2019-07-04 MED FILL — REPATHA SURECLICK 140 MG/ML SUBCUTANEOUS PEN INJECTOR: 28 days supply | Qty: 2 | Fill #0 | Status: AC

## 2019-07-24 NOTE — Unmapped (Signed)
[facility] Refill Coordination Note    Specialty Medication(s) to be Shipped:   General Specialty: Repatha    Other medication(s) to be shipped: N/A     Glen Gross, DOB: 1965/08/01  Phone: [phone] (home)       All above HIPAA information was verified with patient.     Was a Nurse, learning disability used for this call? No    Completed refill call assessment today to schedule patient's medication shipment from the [facility] [phone]).       Specialty medication(s) and dose(s) confirmed: Regimen is correct and unchanged.   Changes to medications: Arlee reports no changes at this time.  Changes to insurance: No  Questions for the pharmacist: No    Confirmed patient received Welcome Packet with first shipment. The patient will receive a drug information handout for each medication shipped and additional FDA Medication Guides as required.       DISEASE/MEDICATION-SPECIFIC INFORMATION        For patients on injectable medications: Patient currently has 0 doses left.  Next injection is scheduled for 08/03/19.    SPECIALTY MEDICATION ADHERENCE     Medication Adherence    Patient reported X missed doses in the last month: 0  Specialty Medication: Repatha  Patient is on additional specialty medications: No                Repatha 140 mg/ml: 0 days of medicine on hand         SHIPPING     Shipping address confirmed in Epic.     Delivery Scheduled: Yes, Expected medication delivery date: 08/01/19.     Medication will be delivered via UPS to the prescription address in Epic WAM.    Nancy Nordmann [facility] Specialty Technician

## 2019-07-31 MED FILL — REPATHA SURECLICK 140 MG/ML SUBCUTANEOUS PEN INJECTOR: SUBCUTANEOUS | 28 days supply | Qty: 2 | Fill #1

## 2019-07-31 MED FILL — REPATHA SURECLICK 140 MG/ML SUBCUTANEOUS PEN INJECTOR: 28 days supply | Qty: 2 | Fill #1 | Status: AC

## 2019-08-08 ENCOUNTER — Other Ambulatory Visit: Payer: Self-pay | Admitting: *Deleted

## 2019-08-08 DIAGNOSIS — I739 Peripheral vascular disease, unspecified: Secondary | ICD-10-CM

## 2019-08-10 ENCOUNTER — Encounter: Payer: Medicare HMO | Admitting: Vascular Surgery

## 2019-08-10 ENCOUNTER — Ambulatory Visit (HOSPITAL_COMMUNITY): Admission: RE | Admit: 2019-08-10 | Payer: Medicare HMO | Source: Ambulatory Visit

## 2019-08-27 NOTE — Unmapped (Signed)
[facility] Refill Coordination Note    Patient states he will be having Vascular Surgery on 09/09/2019    Specialty Medication(s) to be Shipped:   General Specialty: Repatha    Other medication(s) to be shipped: N/A     Glen Gross, DOB: 1966/01/07  Phone: [phone] (home)       All above HIPAA information was verified with patient.     Was a Nurse, learning disability used for this call? No    Completed refill call assessment today to schedule patient's medication shipment from the [facility] [phone]).       Specialty medication(s) and dose(s) confirmed: Regimen is correct and unchanged.   Changes to medications: Taejon reports no changes at this time.  Changes to insurance: No  Questions for the pharmacist: No    Confirmed patient received Welcome Packet with first shipment. The patient will receive a drug information handout for each medication shipped and additional FDA Medication Guides as required.       DISEASE/MEDICATION-SPECIFIC INFORMATION        For patients on injectable medications: Patient currently has 0 doses left.  Next injection is scheduled for 09/02/2019.    SPECIALTY MEDICATION ADHERENCE     Medication Adherence    Patient reported X missed doses in the last month: 0  Specialty Medication: Repatha 140mg /ml  Patient is on additional specialty medications: No  Any gaps in refill history greater than 2 weeks in the last 3 months: no  Demonstrates understanding of importance of adherence: yes  Informant: patient  Reliability of informant: reliable  Confirmed plan for next specialty medication refill: delivery by pharmacy  Refills needed for supportive medications: not needed                Repatha 140 mg/ml: 0 days of medicine on hand         SHIPPING     Shipping address confirmed in Epic.     Delivery Scheduled: Yes, Expected medication delivery date: 08/29/2019.     Medication will be delivered via UPS to the prescription address in Epic WAM.    Miraj Truss D Dymin Dingledine   [facility] Specialty Technician

## 2019-08-28 MED FILL — REPATHA SURECLICK 140 MG/ML SUBCUTANEOUS PEN INJECTOR: 28 days supply | Qty: 2 | Fill #2 | Status: AC

## 2019-08-28 MED FILL — REPATHA SURECLICK 140 MG/ML SUBCUTANEOUS PEN INJECTOR: SUBCUTANEOUS | 28 days supply | Qty: 2 | Fill #2

## 2019-09-04 ENCOUNTER — Encounter (HOSPITAL_COMMUNITY): Payer: Self-pay | Admitting: Emergency Medicine

## 2019-09-04 ENCOUNTER — Emergency Department (HOSPITAL_COMMUNITY)
Admission: EM | Admit: 2019-09-04 | Discharge: 2019-09-05 | Disposition: A | Discharge status: Home / Self Care | Payer: Medicare HMO | Attending: Emergency Medicine | Admitting: Emergency Medicine

## 2019-09-04 ENCOUNTER — Emergency Department (HOSPITAL_COMMUNITY): Payer: Medicare HMO

## 2019-09-04 ENCOUNTER — Other Ambulatory Visit: Payer: Self-pay

## 2019-09-04 DIAGNOSIS — I70213 Atherosclerosis of native arteries of extremities with intermittent claudication, bilateral legs: Secondary | ICD-10-CM

## 2019-09-04 DIAGNOSIS — Z951 Presence of aortocoronary bypass graft: Secondary | ICD-10-CM | POA: Diagnosis not present

## 2019-09-04 DIAGNOSIS — I739 Peripheral vascular disease, unspecified: Secondary | ICD-10-CM | POA: Insufficient documentation

## 2019-09-04 DIAGNOSIS — Z79899 Other long term (current) drug therapy: Secondary | ICD-10-CM | POA: Diagnosis not present

## 2019-09-04 DIAGNOSIS — I10 Essential (primary) hypertension: Secondary | ICD-10-CM | POA: Insufficient documentation

## 2019-09-04 DIAGNOSIS — F17210 Nicotine dependence, cigarettes, uncomplicated: Secondary | ICD-10-CM | POA: Diagnosis not present

## 2019-09-04 DIAGNOSIS — Z7982 Long term (current) use of aspirin: Secondary | ICD-10-CM | POA: Diagnosis not present

## 2019-09-04 DIAGNOSIS — R0789 Other chest pain: Secondary | ICD-10-CM | POA: Diagnosis present

## 2019-09-04 LAB — BASIC METABOLIC PANEL
Anion gap: 8 (ref 5–15)
BUN: 16 mg/dL (ref 6–20)
CO2: 26 mmol/L (ref 22–32)
Calcium: 9.7 mg/dL (ref 8.9–10.3)
Chloride: 107 mmol/L (ref 98–111)
Creatinine, Ser: 1.24 mg/dL (ref 0.61–1.24)
GFR calc Af Amer: >60 mL/min (ref >60)
GFR calc non Af Amer: >60 mL/min (ref >60)
Glucose, Bld: 82 mg/dL (ref 70–99)
Potassium: 4.2 mmol/L (ref 3.5–5.1)
Sodium: 141 mmol/L (ref 135–145)

## 2019-09-04 LAB — TROPONIN I (HIGH SENSITIVITY)
Troponin I (High Sensitivity): 11 ng/L (ref <18)
Troponin I (High Sensitivity): 13 ng/L (ref <18)

## 2019-09-04 LAB — CBC
HCT: 45.1 % (ref 39.0–52.0)
Hemoglobin: 14 g/dL (ref 13.0–17.0)
MCH: 29.9 pg (ref 26.0–34.0)
MCHC: 31 g/dL (ref 30.0–36.0)
MCV: 96.4 fL (ref 80.0–100.0)
Platelets: 273 10*3/uL (ref 150–400)
RBC: 4.68 MIL/uL (ref 4.22–5.81)
RDW: 14.3 % (ref 11.5–15.5)
WBC: 7.3 10*3/uL (ref 4.0–10.5)
nRBC: 0 % (ref 0.0–0.2)

## 2019-09-04 MED ORDER — OXYCODONE HCL 10 MG PO TABS: 10.0000 mg | ORAL_TABLET | Freq: Four times a day (QID) | ORAL | 0 refills | Status: DC | PRN

## 2019-09-04 MED ORDER — HYDROMORPHONE HCL 1 MG/ML IJ SOLN
1.0000 mg | Freq: Once | INTRAMUSCULAR | Status: AC
  Administered 2019-09-04: 1 mg via INTRAVENOUS
  Filled 2019-09-04: qty 1

## 2019-09-04 MED ORDER — SODIUM CHLORIDE 0.9% FLUSH: 3.0000 mL | Freq: Once | INTRAVENOUS | Status: DC

## 2019-09-04 MED ORDER — ONDANSETRON HCL 4 MG/2ML IJ SOLN
4.0000 mg | Freq: Once | INTRAMUSCULAR | Status: AC
  Administered 2019-09-04: 4 mg via INTRAVENOUS
  Filled 2019-09-04: qty 2

## 2019-09-04 NOTE — ED Triage Notes (Signed)
Pt presents with intermittent chest pain, pain and weakness to his right leg. Pt has been referred to a vascular and vein specialists, but states he is unable to wait for his appointment. Hx vascular and cardiac procedures in [place].

## 2019-09-04 NOTE — Consult Note (Signed)
ASSESSMENT & PLAN   PERIPHERAL VASCULAR DISEASE: This patient's leg pain is multifactorial given his chronic back pain and neuropathy.  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  He has claudication at a very short distance and tells me he can only walk 10 feet before experiencing symptoms in the right leg.  I do not get any clear-cut history of rest pain or history of nonhealing ulcers.  He was scheduled to see [place] in the office in late July.  I have explained that we will arrange for an arteriogram as an outpatient in the next week or 2.  My office will contact him to make those arrangements.  We will make further recommendations pending these results.  He is on aspirin and is on a statin.  We have discussed the importance of tobacco cessation.  I have reviewed with the patient the indications for arteriography. In addition, I have reviewed the potential complications of arteriography including but not limited to: Bleeding, arterial injury, arterial thrombosis, dye action, renal insufficiency, or other unpredictable medical problems. I have explained to the patient that if we find disease amenable to angioplasty we could potentially address this at the same time. I have discussed the potential complications of angioplasty and stenting, including but not limited to: Bleeding, arterial thrombosis, arterial injury, dissection, or the need for surgical intervention.   REASON FOR CONSULT:    Right calf claudication.  The consult is requested by the emergency department.  HPI:   Benjamin Espinoza is a 54 y.o. male who presented to the emergency department with intermittent chest pain and weakness in the right leg.  Patient was having right calf claudication.  He was scheduled to see [place] in July but could not wait for the appointment.  I was asked to see the patient in the emergency department.  On my history the patient describes bilateral calf  claudication which she has had for 6 to 7 months.  His symptoms are more significant on the right side.  He describes pain in the calf which is brought on by ambulation and relieved with rest.  He states that he can now only walk about 10 feet before experiencing symptoms in the right calf.  He also describes some hip pain and leg weakness but has a history of chronic back pain and neuropathy.  He is on chronic pain medicine for his back and also on gabapentin.  His risk factors for peripheral vascular disease include hypertension, hypercholesterolemia, a family history of premature cardiovascular disease, and tobacco use.  He smoked up to 1-1/2 packs/day for many years but is now cut back to 3 cigarettes a day.  He had a previous myocardial infarction and says he underwent coronary revascularization.  Vein was taken from the right leg.  He also has a history of congestive heart failure.  He denies any history of chronic kidney disease.  According to the records he had some vascular procedure in [place] but does not know the details.  Past Medical History:  Diagnosis Date  . Essential hypertension   . 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 Hyperlipidemia   . Tobacco use disorder     No family history on file.  SOCIAL HISTORY: Social History   Tobacco Use  . Smoking status: Current Every Day Smoker  . Smokeless tobacco: Never Used  Substance Use Topics  . Alcohol use: No    No Known Allergies  Current Facility-Administered Medications  Medication Dose Route Frequency Provider Last Rate Last Admin  . sodium chloride flush (NS) 0.9 % injection 3 mL  3 mL Intravenous Once Arby Barrette, MD       Current Outpatient Medications  Medication Sig Dispense Refill  . aspirin EC 81 MG tablet Take 2 tablets (162 mg total) by mouth daily. 30 tablet 0  . atorvastatin (LIPITOR) 80 MG tablet Take 1 tablet (80 mg total) by mouth at bedtime. 30 tablet 0  . clopidogrel (PLAVIX) 75 MG tablet Take 75  mg by mouth daily.    Marland Kitchen gabapentin (NEURONTIN) 600 MG tablet Take 600 mg by mouth 3 (three) times daily.    . Oxycodone HCl 10 MG TABS Take 10 mg by mouth 4 (four) times daily.     Marland Kitchen REPATHA SURECLICK 140 MG/ML SOAJ Inject 1 Syringe into the skin every 14 (fourteen) days.    Marland Kitchen amLODipine (NORVASC) 5 MG tablet Take 1 tablet (5 mg total) by mouth daily. (Patient not taking: Reported on 09/04/2019) 30 tablet 0  . cilostazol (PLETAL) 100 MG tablet Take 100 mg by mouth 2 (two) times daily.    Marland Kitchen lisinopril (ZESTRIL) 20 MG tablet Take 20 mg by mouth daily.    Marland Kitchen lisinopril-hydrochlorothiazide (ZESTORETIC) 20-12.5 MG tablet Take 1 tablet by mouth daily. (Patient not taking: Reported on 09/04/2019) 30 tablet 0  . losartan (COZAAR) 25 MG tablet Take 25 mg by mouth daily.    . metoprolol succinate (TOPROL-XL) 50 MG 24 hr tablet Take 1 tablet (50 mg total) by mouth daily. Take with or immediately following a meal. 30 tablet 0  . nitroGLYCERIN (NITROSTAT) 0.4 MG SL tablet Place 1 tablet (0.4 mg total) under the tongue every 5 (five) minutes x 3 doses as needed for chest pain. 20 tablet 0    REVIEW OF SYSTEMS:  [X]  denotes positive finding, [ ]  denotes negative finding Cardiac  Comments:  Chest pain or chest pressure:    Shortness of breath upon exertion: x   Short of breath when lying flat:    Irregular heart rhythm:        Vascular    Pain in calf, thigh, or hip brought on by ambulation: x   Pain in feet at night that wakes you up from your sleep:     Blood clot in your veins:    Leg swelling:         Pulmonary    Oxygen at home:    Productive cough:     Wheezing:         Neurologic    Sudden weakness in arms or legs:     Sudden numbness in arms or legs:     Sudden onset of difficulty speaking or slurred speech:    Temporary loss of vision in one eye:     Problems with dizziness:         Gastrointestinal    Blood in stool:     Vomited blood:         Genitourinary    Burning when urinating:      Blood in urine:        Psychiatric    Major depression:         Hematologic    Bleeding problems:    Problems with blood clotting too easily:        Skin    Rashes or ulcers:        Constitutional    Fever or chills:    -  PHYSICAL EXAM:   Vitals:   09/04/19 1915 09/04/19 1930 09/04/19 2000 09/04/19 2030  BP: (!) 152/101 (!) 143/110 (!) 145/98 (!) 146/99  Pulse:  (!) 58 (!) 58 (!) 59  Resp: 17 15 13 13   Temp:      TempSrc:      SpO2:  99% 96% 94%  Weight:      Height:       Body mass index is 26.39 kg/m. GENERAL: The patient is a well-nourished male, in no acute distress. The vital signs are documented above. CARDIAC: There is a regular rate and rhythm.  VASCULAR: I do not detect carotid bruits. On the right side he has a palpable femoral pulse.  I cannot palpate a popliteal or pedal pulses.  He has a dampened monophasic dorsalis pedis signal with a Doppler and a monophasic posterior tibial signal with the Doppler.   On the left side he has a palpable femoral pulse.  I cannot palpate popliteal or pedal pulses. PULMONARY: There is good air exchange bilaterally without wheezing or rales. ABDOMEN: Soft and non-tender with normal pitched bowel sounds.  I do not palpate an abdominal aortic aneurysm. MUSCULOSKELETAL: There are no major deformities. NEUROLOGIC: No focal weakness or paresthesias are detected. SKIN: There are no ulcers or rashes noted. PSYCHIATRIC: The patient has a normal affect.  DATA:    Lab Results  Component Value Date   WBC 7.3 09/04/2019   HGB 14.0 09/04/2019   HCT 45.1 09/04/2019   MCV 96.4 09/04/2019   PLT 273 09/04/2019   Lab Results  Component Value Date   NA 141 09/04/2019   K 4.2 09/04/2019   CL 107 09/04/2019   CO2 26 09/04/2019   Lab Results  Component Value Date   CREATININE 1.24 09/04/2019   LABS: His GFR is greater than 60.  11/05/2019 [facility]: [phone] Office:  [phone]

## 2019-09-04 NOTE — Discharge Instructions (Signed)
Please read and follow all provided instructions.  Your diagnoses today include:  1. Claudication [facility])     Tests performed today include:  An EKG of your heart  A chest x-ray  Cardiac enzymes - a blood test for heart muscle damage  Blood counts and electrolytes  Vital signs. See below for your results today.   Medications prescribed:   Oxycodone - narcotic pain medication  DO NOT drive or perform any activities that require you to be awake and alert because this medicine can make you drowsy.   Take any prescribed medications only as directed.  Follow-up instructions: Please follow-up with Dr. Adele Dan office as planned for evaluation of your poor circulation.   Return instructions:  SEEK IMMEDIATE MEDICAL ATTENTION IF:  You have severe pain in your leg that does not go away with rest.   You have severe chest pain, especially if the pain is crushing or pressure-like and spreads to the arms, back, neck, or jaw, or if you have sweating, nausea (feeling sick to your stomach), or shortness of breath. THIS IS AN EMERGENCY. Don't wait to see if the pain will go away. Get medical help at once. Call 911 or 0 (operator). DO NOT drive yourself to the hospital.   You wake from sleep with chest pain or shortness of breath.  You feel dizzy or faint.  You have chest pain not typical of your usual pain for which you originally saw your caregiver.   You have any other emergent concerns regarding your health.   Your vital signs today were: BP (!) 146/99 (BP Location: Left Arm)   Pulse (!) 59   Temp 99.1 F (37.3 C) (Oral)   Resp 13   Ht 6\' 1"  (1.854 m)   Wt 90.7 kg   SpO2 94%   BMI 26.39 kg/m  If your blood pressure (BP) was elevated above 135/85 this visit, please have this repeated by your doctor within one month. --------------

## 2019-09-04 NOTE — ED Provider Notes (Signed)
[facility] EMERGENCY DEPARTMENT Provider Note   CSN: [id number] Arrival date & time: 09/04/19  1534     History Chief Complaint  Patient presents with  . Chest Pain    Benjamin Espinoza is a 54 y.o. male.  Patient with history of CABG, hypertension presents to the emergency department today for right leg pain with ambulation.  Patient states that this has been ongoing for about a month and has been progressing.  Patient states that at this point, he can only walk a few steps, before he needs to take a rest due to severe pain in his leg.  He also complains of numbness and a cool sensation down the back of his leg.  No fevers.  Patient states that he has some intermittent chest pains, none currently, and this is not why he is here today.  No shortness of breath, nausea or vomiting.  Patient takes clopidogrel and aspirin per his chart.  His doctor at [facility] has referred him to vascular surgery here, but he states he does not have an appointment until the end of the month.  He does not think he can make it to his appointment.        Past Medical History:  Diagnosis Date  . Essential hypertension   . 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 Hyperlipidemia   . Tobacco use disorder     Patient Active Problem List   Diagnosis Date Noted  . Atypical chest pain 05/07/2017    History reviewed. No pertinent surgical history.     No family history on file.  Social History   Tobacco Use  . Smoking status: Current Every Day Smoker  . Smokeless tobacco: Never Used  Substance Use Topics  . Alcohol use: No  . Drug use: No    Home Medications Prior to Admission medications   Medication Sig Start Date End Date Taking? Authorizing Provider  aspirin EC 81 MG tablet Take 2 tablets (162 mg total) by mouth daily. 05/08/17  Yes Yates Decamp, MD  atorvastatin (LIPITOR) 80 MG tablet Take 1 tablet (80 mg total) by mouth at bedtime. 05/08/17  Yes Yates Decamp, MD    clopidogrel (PLAVIX) 75 MG tablet Take 75 mg by mouth daily. 04/04/19  Yes [provider]  gabapentin (NEURONTIN) 600 MG tablet Take 600 mg by mouth 3 (three) times daily.   Yes [provider]  Oxycodone HCl 10 MG TABS Take 10 mg by mouth 4 (four) times daily.    Yes [provider]  REPATHA SURECLICK 140 MG/ML SOAJ Inject 1 Syringe into the skin every 14 (fourteen) days. 08/28/19  Yes [provider]  amLODipine (NORVASC) 5 MG tablet Take 1 tablet (5 mg total) by mouth daily. Patient not taking: Reported on 09/04/2019 05/08/17   Yates Decamp, MD  cilostazol (PLETAL) 100 MG tablet Take 100 mg by mouth 2 (two) times daily. 08/02/19   [provider]  lisinopril (ZESTRIL) 20 MG tablet Take 20 mg by mouth daily.    [provider]  lisinopril-hydrochlorothiazide (ZESTORETIC) 20-12.5 MG tablet Take 1 tablet by mouth daily. Patient not taking: Reported on 09/04/2019 05/08/17 09/04/19  Yates Decamp, MD  losartan (COZAAR) 25 MG tablet Take 25 mg by mouth daily.    [provider]  metoprolol succinate (TOPROL-XL) 50 MG 24 hr tablet Take 1 tablet (50 mg total) by mouth daily. Take with or immediately following a meal. 05/08/17   Yates Decamp, MD  nitroGLYCERIN (NITROSTAT) 0.4 MG SL tablet Place 1 tablet (0.4 mg total) under the tongue every 5 (five) minutes x 3 doses as needed for chest pain. 05/08/17   Yates Decamp, MD    Allergies    Patient has no known allergies.  Review of Systems   Review of Systems  Constitutional: Negative for fever.  HENT: Negative for rhinorrhea and sore throat.   Eyes: Negative for redness.  Respiratory: Negative for cough.   Cardiovascular: Positive for chest pain. Negative for leg swelling.  Gastrointestinal: Negative for abdominal pain, diarrhea, nausea and vomiting.  Genitourinary: Negative for dysuria.  Musculoskeletal: Positive for myalgias. Negative for joint swelling.  Skin: Negative for color change and rash.   Neurological: Positive for numbness. Negative for headaches.    Physical Exam Updated Vital Signs BP 129/82 (BP Location: Right Arm)   Pulse 60   Temp 99.1 F (37.3 C) (Oral)   Resp 18   SpO2 98%   Physical Exam Vitals and nursing note reviewed.  Constitutional:      Appearance: He is well-developed.  HENT:     Head: Normocephalic and atraumatic.  Eyes:     General:        Right eye: No discharge.        Left eye: No discharge.     Conjunctiva/sclera: Conjunctivae normal.  Cardiovascular:     Rate and Rhythm: Normal rate and regular rhythm.     Pulses: Decreased pulses.          Dorsalis pedis pulses are 0 on the right side and 1+ on the left side.       Posterior tibial pulses are detected w/ Doppler on the right side and detected w/ Doppler on the left side.     Heart sounds: Normal heart sounds.     Comments: Unable to doppler DP pulse on right, however PT is easily found bilaterally with doppler.  Pulmonary:     Effort: Pulmonary effort is normal.     Breath sounds: Normal breath sounds.  Abdominal:     Palpations: Abdomen is soft.     Tenderness: There is no abdominal tenderness.  Musculoskeletal:     Cervical back: Normal range of motion and neck supple.  Skin:    General: Skin is warm and dry.  Neurological:     Mental Status: He is alert.     ED Results / Procedures / Treatments   Labs (all labs ordered are listed, but only abnormal results are displayed) Labs Reviewed  BASIC METABOLIC PANEL  CBC  TROPONIN I (HIGH SENSITIVITY)  TROPONIN I (HIGH SENSITIVITY)    ED ECG REPORT   Date: 09/04/2019  Rate: 66  Rhythm: normal sinus rhythm  QRS Axis: normal  Intervals: normal  ST/T Wave abnormalities: normal  Conduction Disutrbances:none  Narrative Interpretation:   Old EKG Reviewed: changed from 2019, RAD resolved  I have personally reviewed the EKG tracing and agree with the computerized printout as noted.  Radiology DG Chest 2 View  Result  Date: 09/04/2019 CLINICAL DATA:  Chest pain. EXAM: CHEST - 2 VIEW COMPARISON:  05/15/2019, chest CT 02/01/2017 FINDINGS: Median sternotomy and CABG.The cardiomediastinal contours are normal. The lungs are clear. Pulmonary vasculature is normal. No consolidation, pleural effusion, or pneumothorax. No acute osseous abnormalities are seen. Bone islands in the right anterior sixth rib, as seen on CT. IMPRESSION: No acute chest findings. Electronically Signed   By: Narda Rutherford M.D.   On: 09/04/2019 16:10  Procedures Procedures (including critical care time)  Medications Ordered in ED Medications  sodium chloride flush (NS) 0.9 % injection 3 mL (3 mLs Intravenous Not Given 09/04/19 1938)  HYDROmorphone (DILAUDID) injection 1 mg (has no administration in time range)  HYDROmorphone (DILAUDID) injection 1 mg (1 mg Intravenous Given 09/04/19 1915)  ondansetron (ZOFRAN) injection 4 mg (4 mg Intravenous Given 09/04/19 1924)    ED Course  I have reviewed the triage vital signs and the nursing notes.  Pertinent labs & imaging results that were available during my care of the patient were reviewed by me and considered in my medical decision making (see chart for details).  Patient seen and examined. Work-up initiated. Reviewed work-up at this point.   Vital signs reviewed and are as follows: BP 129/82 (BP Location: Right Arm)   Pulse 60   Temp 99.1 F (37.3 C) (Oral)   Resp 18   SpO2 98%   Discussed with Dr. Donnald Garre. ABI's ordered.   6:36 PM Spoke with vascular tech -- unable to perform arterial studies after 5pm. D/w Dr. Donnald Garre. Will discuss with vascular surgery.   7:30 PM Spoke with RN in OR. Dr. [name] in emergency case. Will callback when able to discuss patient.   11:01 PM Dr. [name] as seen patient. Benjamin Espinoza is very appreciative.   Plan for outpatient angiogram in the next 1-2 weeks, d/c to home.   Prior to d/c, will give another dose of IV pain meds. Will rx #12 10mg   oxycodone. Pt requesting assistance with transportation home.     MDM Rules/Calculators/A&P                          Patient with progressive right leg claudication, decreased pulses, although does have dopplerable PT pulses.  Vascular surgery has been involved in the care here tonight.  Patient will be discharged home with outpatient angiography to be performed in the next several weeks.  Labs otherwise reassuring.  Final Clinical Impression(s) / ED Diagnoses Final diagnoses:  Claudication [facility])    Rx / DC Orders ED Discharge Orders    None       [facility], Renne Crigler 09/04/19 2337    2338, MD 09/05/19 [phone]

## 2019-09-05 DIAGNOSIS — I739 Peripheral vascular disease, unspecified: Secondary | ICD-10-CM | POA: Diagnosis not present

## 2019-09-05 MED ORDER — ONDANSETRON HCL 4 MG PO TABS
4.0000 mg | ORAL_TABLET | Freq: Once | ORAL | Status: AC
  Administered 2019-09-05: 4 mg via ORAL
  Filled 2019-09-05: qty 1

## 2019-09-19 MED ORDER — REPATHA SURECLICK 140 MG/ML SUBCUTANEOUS PEN INJECTOR
SUBCUTANEOUS | 2 refills | 28.00000 days | Status: CN
Start: 2019-09-19 — End: 2019-10-19

## 2019-09-19 NOTE — Unmapped (Addendum)
[facility] Refill Coordination Note    Specialty Medication(s) to be Shipped:   General Specialty: Repatha    Other medication(s) to be shipped: N/A     Glen Gross, DOB: May 18, 1965  Phone: [phone] (home)       All above HIPAA information was verified with patient.     Was a Nurse, learning disability used for this call? No    Completed refill call assessment today to schedule patient's medication shipment from the [facility] [phone]).       Specialty medication(s) and dose(s) confirmed: Regimen is correct and unchanged.   Changes to medications: Kamarian reports no changes at this time.  Changes to insurance: No  Questions for the pharmacist: No    Confirmed patient received Welcome Packet with first shipment. The patient will receive a drug information handout for each medication shipped and additional FDA Medication Guides as required.       DISEASE/MEDICATION-SPECIFIC INFORMATION        For patients on injectable medications: Patient currently has 0 doses left.  Next injection is scheduled for 09/30/19.    SPECIALTY MEDICATION ADHERENCE     Medication Adherence    Patient reported X missed doses in the last month: 0  Specialty Medication: Repatha 140mg /ml  Patient is on additional specialty medications: No                Repatha 140 mg/ml: 0 days of medicine on hand       SHIPPING     Shipping address confirmed in Epic.     Delivery Scheduled: Yes, Expected medication delivery date: 09/27/19.  However, Rx request for refills was sent to the provider as there are none remaining.     Medication will be delivered via UPS to the prescription address in Epic WAM.    Nancy Nordmann [facility] Specialty Technician

## 2019-09-20 ENCOUNTER — Ambulatory Visit (HOSPITAL_COMMUNITY)
Admission: RE | Admit: 2019-09-20 | Discharge: 2019-09-20 | Disposition: A | Discharge status: Home / Self Care | Payer: Medicare HMO | Source: Ambulatory Visit | Attending: Vascular Surgery | Admitting: Vascular Surgery

## 2019-09-20 ENCOUNTER — Other Ambulatory Visit: Payer: Self-pay

## 2019-09-20 ENCOUNTER — Ambulatory Visit (INDEPENDENT_AMBULATORY_CARE_PROVIDER_SITE_OTHER): Payer: Medicare HMO | Admitting: Vascular Surgery

## 2019-09-20 ENCOUNTER — Encounter: Payer: Self-pay | Admitting: Vascular Surgery

## 2019-09-20 VITALS — BP 158/101 | HR 68 | Temp 98.0°F | Resp 18 | Ht 73.0 in | Wt 197.0 lb

## 2019-09-20 DIAGNOSIS — I739 Peripheral vascular disease, unspecified: Secondary | ICD-10-CM | POA: Diagnosis present

## 2019-09-20 NOTE — H&P (View-Only) (Signed)
° °Patient name: Benjamin Espinoza MRN: [id number] DOB: 07/21/1965 Sex: male ° ° °HPI: °Benjamin Espinoza is a 53 y.o. male, with known peripheral arterial disease.  He was seen by my partner Dr. Dickson on July 7.  At that time the plan was to schedule him from arteriogram possible intervention.  According to our office staff they try to call him multiple times to schedule this but never returned a call.  He now presents continued of the same complaint of right lower calf claudication worse than the left.  Both legs are fairly equally affected.  He states that now he is starting to get some pain up into his hips.  He does not have rest pain in his foot.  He has no nonhealing wounds.  He is on aspirin and Plavix.  He is not currently on a cholesterol medication. 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 ° °SOCIAL HISTORY: °Social History  ° °Socioeconomic History  °• Marital status: Married  °  Spouse name: Not on file  °• Number of children: Not on file  °• Years of education: Not on file  °• Highest education level: Not on file  °Occupational History  °• Not on file  °Tobacco Use  °• Smoking status: Current Every Day Smoker  °  Types: Cigars  °• Smokeless tobacco: Never Used  °Vaping Use  °• Vaping Use: Never used  °Substance and Sexual Activity  °• Alcohol use: No  °• Drug use: No  °• Sexual activity: Not on file  °Other Topics Concern  °• Not on file  °Social History Narrative  °• Not on file  ° °Social Determinants of Health  ° °Financial Resource Strain:   °• Difficulty of Paying Living Expenses:   °Food Insecurity:   °• Worried About Running Out of Food in the Last Year:   °• Ran Out of Food in the Last Year:   °Transportation Needs:   °• Lack of  Transportation (Medical):   °• Lack of Transportation (Non-Medical):   °Physical Activity:   °• Days of Exercise per Week:   °• Minutes of Exercise per Session:   °Stress:   °• Feeling of Stress :   °Social Connections:   °• Frequency of Communication with Friends and Family:   °• Frequency of Social Gatherings with Friends and Family:   °• Attends Religious Services:   °• Active Member of Clubs or Organizations:   °• Attends Club or Organization Meetings:   °• Marital Status:   °Intimate Partner Violence:   °• Fear of Current or Ex-Partner:   °• Emotionally Abused:   °• Physically Abused:   °• Sexually Abused:   ° ° °No Known Allergies ° °Current Outpatient Medications  °Medication Sig Dispense Refill  °• aspirin EC 81 MG tablet Take 2 tablets (162 mg total) by mouth daily. 30 tablet 0  °• cilostazol (PLETAL) 100 MG tablet Take 100 mg by mouth 2 (two) times daily.    °• gabapentin (NEURONTIN) 600 MG tablet Take 600 mg by mouth daily as needed (Pain).     °• lisinopril (ZESTRIL) 20 MG tablet Take 20 mg by mouth daily.    °•   Oxycodone HCl 10 MG TABS Take 10 mg by mouth 4 (four) times daily.     °• REPATHA SURECLICK 140 MG/ML SOAJ Inject 1 Syringe into the skin every 14 (fourteen) days.    °• amLODipine (NORVASC) 5 MG tablet Take 1 tablet (5 mg total) by mouth daily. (Patient not taking: Reported on 09/20/2019) 30 tablet 0  °• clopidogrel (PLAVIX) 75 MG tablet Take 75 mg by mouth daily. (Patient not taking: Reported on 09/20/2019)    °• lisinopril-hydrochlorothiazide (ZESTORETIC) 20-12.5 MG tablet Take 1 tablet by mouth daily. (Patient not taking: Reported on 09/04/2019) 30 tablet 0  °• losartan (COZAAR) 25 MG tablet Take 25 mg by mouth daily. (Patient not taking: Reported on 09/20/2019)    °• Oxycodone HCl 10 MG TABS Take 1 tablet (10 mg total) by mouth every 6 (six) hours as needed. 12 tablet 0  ° °No current facility-administered medications for this visit.  ° ° °ROS:  ° °General:  No weight loss, Fever,  chills ° °HEENT: No recent headaches, no nasal bleeding, no visual changes, no sore throat ° °Neurologic: No dizziness, blackouts, seizures. No recent symptoms of stroke or mini- stroke. No recent episodes of slurred speech, or temporary blindness. ° °Cardiac: No recent episodes of chest pain/pressure, no shortness of breath at rest.  No shortness of breath with exertion.  Denies history of atrial fibrillation or irregular heartbeat ° °Vascular: No history of rest pain in feet.  No history of claudication.  No history of non-healing ulcer, No history of DVT  ° °Pulmonary: No home oxygen, no productive cough, no hemoptysis,  No asthma or wheezing ° °Musculoskeletal:  [ ] Arthritis, [X] Low back pain,  [X] Joint pain ° °Hematologic:No history of hypercoagulable state.  No history of easy bleeding.  No history of anemia ° °Gastrointestinal: No hematochezia or melena,  No gastroesophageal reflux, no trouble swallowing ° °Urinary: [ ] chronic Kidney disease, [ ] on HD - [ ] MWF or [ ] TTHS, [ ] Burning with urination, [ ] Frequent urination, [ ] Difficulty urinating;  ° °Skin: No rashes ° °Psychological: No history of anxiety,  No history of depression ° ° °Physical Examination ° °Vitals:  ° 09/20/19 0937  °BP: (!) 158/101  °Pulse: 68  °Resp: 18  °Temp: 98 °F (36.7 °C)  °SpO2: 98%  °Weight: 197 lb (89.4 kg)  °Height: 6' 1" (1.854 m)  ° ° °Body mass index is 25.99 kg/m². ° °General:  Alert and oriented, no acute distress °HEENT: Normal °Neck: No JVD °Cardiac: Regular Rate and Rhythm °Skin: No rash or ulcer °Extremity Pulses:  2+ radial, brachial, femoral, absent dorsalis pedis, posterior tibial pulses bilaterally °Musculoskeletal: No deformity or edema  °Neurologic: Upper and lower extremity motor 5/5 and symmetric ° °DATA:  °Patient had bilateral ABIs performed today which I reviewed and interpreted.  Left side was 0.87 right side was 0.55 toe pressure on the right was 51 on left side was 77 ° °ASSESSMENT: Bilateral calf  claudication right worse than left ° ° °PLAN: Patient was scheduled for an aortogram lower extremity runoff as suggested by Dr. Dickson on his prior visit.  Tentatively this is going to be scheduled for September 28, 2019.  The patient did not have transportation for tomorrow.  If he finds a ride for tomorrow we may move this to September 21, 2019.  Risk benefits possible complications and procedure details were discussed with the patient previously by Dr. Dickson. ° ° °Malina Geers, MD °[facility] Office: [phone] Pager: [phone]

## 2019-09-20 NOTE — Progress Notes (Signed)
Patient name: Benjamin Espinoza MRN: [id number] DOB: 1965-08-26 Sex: male   HPI: Benjamin Espinoza is a 54 y.o. male, with known peripheral arterial disease.  He was seen by my partner Dr. [name] on July 7.  At that time the plan was to schedule him from arteriogram possible intervention.  According to our office staff they try to call him multiple times to schedule this but never returned a call.  He now presents continued of the same complaint of right lower calf claudication worse than the left.  Both legs are fairly equally affected.  He states that now he is starting to get some pain up into his hips.  He does not have rest pain in his foot.  He has no nonhealing wounds.  He is on aspirin and Plavix.  He is not currently on a cholesterol medication.  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  SOCIAL HISTORY: Social History   Socioeconomic History   Marital status: Married    Spouse name: Not on file   Number of children: Not on file   Years of education: Not on file   Highest education level: Not on file  Occupational History   Not on file  Tobacco Use   Smoking status: Current Every Day Smoker    Types: Cigars   Smokeless tobacco: Never Used  Vaping Use   Vaping Use: Never used  Substance and Sexual Activity   Alcohol use: No   Drug use: No   Sexual activity: Not on file  Other Topics Concern   Not on file  Social History Narrative   Not on file   Social Determinants of Health   Financial Resource Strain:    Difficulty of Paying Living Expenses:   Food Insecurity:    Worried About Programme researcher, broadcasting/film/video in the Last Year:    Barista in the Last Year:   Transportation Needs:    Automotive engineer (Medical):    Lack of Transportation (Non-Medical):   Physical Activity:    Days of Exercise per Week:    Minutes of Exercise per Session:   Stress:    Feeling of Stress :   Social Connections:    Frequency of Communication with Friends and Family:    Frequency of Social Gatherings with Friends and Family:    Attends Religious Services:    Active Member of Clubs or Organizations:    Attends Engineer, structural:    Marital Status:   Intimate Partner Violence:    Fear of Current or Ex-Partner:    Emotionally Abused:    Physically Abused:    Sexually Abused:     No Known Allergies  Current Outpatient Medications  Medication Sig Dispense Refill   aspirin EC 81 MG tablet Take 2 tablets (162 mg total) by mouth daily. 30 tablet 0   cilostazol (PLETAL) 100 MG tablet Take 100 mg by mouth 2 (two) times daily.     gabapentin (NEURONTIN) 600 MG tablet Take 600 mg by mouth daily as needed (Pain).      lisinopril (ZESTRIL) 20 MG tablet Take 20 mg by mouth daily.  Oxycodone HCl 10 MG TABS Take 10 mg by mouth 4 (four) times daily.      REPATHA SURECLICK 140 MG/ML SOAJ Inject 1 Syringe into the skin every 14 (fourteen) days.     amLODipine (NORVASC) 5 MG tablet Take 1 tablet (5 mg total) by mouth daily. (Patient not taking: Reported on 09/20/2019) 30 tablet 0   clopidogrel (PLAVIX) 75 MG tablet Take 75 mg by mouth daily. (Patient not taking: Reported on 09/20/2019)     lisinopril-hydrochlorothiazide (ZESTORETIC) 20-12.5 MG tablet Take 1 tablet by mouth daily. (Patient not taking: Reported on 09/04/2019) 30 tablet 0   losartan (COZAAR) 25 MG tablet Take 25 mg by mouth daily. (Patient not taking: Reported on 09/20/2019)     Oxycodone HCl 10 MG TABS Take 1 tablet (10 mg total) by mouth every 6 (six) hours as needed. 12 tablet 0   No current facility-administered medications for this visit.    ROS:   General:  No weight loss, Fever,  chills  HEENT: No recent headaches, no nasal bleeding, no visual changes, no sore throat  Neurologic: No dizziness, blackouts, seizures. No recent symptoms of stroke or mini- stroke. No recent episodes of slurred speech, or temporary blindness.  Cardiac: No recent episodes of chest pain/pressure, no shortness of breath at rest.  No shortness of breath with exertion.  Denies history of atrial fibrillation or irregular heartbeat  Vascular: No history of rest pain in feet.  No history of claudication.  No history of non-healing ulcer, No history of DVT   Pulmonary: No home oxygen, no productive cough, no hemoptysis,  No asthma or wheezing  Musculoskeletal:  [ ]  Arthritis, [X]  Low back pain,  [X]  Joint pain  Hematologic:No history of hypercoagulable state.  No history of easy bleeding.  No history of anemia  Gastrointestinal: No hematochezia or melena,  No gastroesophageal reflux, no trouble swallowing  Urinary: [ ]  chronic Kidney disease, [ ]  on HD - [ ]  MWF or [ ]  TTHS, [ ]  Burning with urination, [ ]  Frequent urination, [ ]  Difficulty urinating;   Skin: No rashes  Psychological: No history of anxiety,  No history of depression   Physical Examination  Vitals:   09/20/19 0937  BP: (!) 158/101  Pulse: 68  Resp: 18  Temp: 98 F (36.7 C)  SpO2: 98%  Weight: 197 lb (89.4 kg)  Height: 6\' 1"  (1.854 m)    Body mass index is 25.99 kg/m.  General:  Alert and oriented, no acute distress HEENT: Normal Neck: No JVD Cardiac: Regular Rate and Rhythm Skin: No rash or ulcer Extremity Pulses:  2+ radial, brachial, femoral, absent dorsalis pedis, posterior tibial pulses bilaterally Musculoskeletal: No deformity or edema  Neurologic: Upper and lower extremity motor 5/5 and symmetric  DATA:  Patient had bilateral ABIs performed today which I reviewed and interpreted.  Left side was 0.87 right side was 0.55 toe pressure on the right was 51 on left side was 77  ASSESSMENT: Bilateral calf  claudication right worse than left   PLAN: Patient was scheduled for an aortogram lower extremity runoff as suggested by Dr. on his prior visit.  Tentatively this is going to be scheduled for September 28, 2019.  The patient did not have transportation for tomorrow.  If he finds a ride for tomorrow we may move this to September 21, 2019.  Risk benefits possible complications and procedure details were discussed with the patient previously by Dr. .   , MD [facility] Office: [phone] Pager: [phone]

## 2019-09-21 ENCOUNTER — Encounter (HOSPITAL_COMMUNITY)
Admission: RE | Disposition: A | Discharge status: Home / Self Care | Payer: Self-pay | Source: Home / Self Care | Attending: Vascular Surgery

## 2019-09-21 ENCOUNTER — Ambulatory Visit (HOSPITAL_COMMUNITY)
Admission: RE | Admit: 2019-09-21 | Discharge: 2019-09-21 | Disposition: A | Discharge status: Home / Self Care | Payer: Medicare HMO | Attending: Vascular Surgery | Admitting: Vascular Surgery

## 2019-09-21 ENCOUNTER — Other Ambulatory Visit: Payer: Self-pay

## 2019-09-21 ENCOUNTER — Encounter: Payer: Self-pay | Admitting: Vascular Surgery

## 2019-09-21 DIAGNOSIS — I70213 Atherosclerosis of native arteries of extremities with intermittent claudication, bilateral legs: Secondary | ICD-10-CM

## 2019-09-21 DIAGNOSIS — I252 Old myocardial infarction: Secondary | ICD-10-CM | POA: Insufficient documentation

## 2019-09-21 DIAGNOSIS — F17290 Nicotine dependence, other tobacco product, uncomplicated: Secondary | ICD-10-CM | POA: Insufficient documentation

## 2019-09-21 DIAGNOSIS — E785 Hyperlipidemia, unspecified: Secondary | ICD-10-CM | POA: Insufficient documentation

## 2019-09-21 DIAGNOSIS — I739 Peripheral vascular disease, unspecified: Secondary | ICD-10-CM

## 2019-09-21 DIAGNOSIS — I10 Essential (primary) hypertension: Secondary | ICD-10-CM | POA: Insufficient documentation

## 2019-09-21 DIAGNOSIS — Z7982 Long term (current) use of aspirin: Secondary | ICD-10-CM | POA: Insufficient documentation

## 2019-09-21 DIAGNOSIS — Z79899 Other long term (current) drug therapy: Secondary | ICD-10-CM | POA: Diagnosis not present

## 2019-09-21 DIAGNOSIS — Z951 Presence of aortocoronary bypass graft: Secondary | ICD-10-CM | POA: Insufficient documentation

## 2019-09-21 DIAGNOSIS — Z7902 Long term (current) use of antithrombotics/antiplatelets: Secondary | ICD-10-CM | POA: Diagnosis not present

## 2019-09-21 HISTORY — DX: Peripheral vascular disease, unspecified: I73.9

## 2019-09-21 HISTORY — PX: ABDOMINAL AORTOGRAM W/LOWER EXTREMITY: CATH118223

## 2019-09-21 LAB — POCT I-STAT, CHEM 8
BUN: 14 mg/dL (ref 6–20)
Calcium, Ion: 1.22 mmol/L (ref 1.15–1.40)
Chloride: 104 mmol/L (ref 98–111)
Creatinine, Ser: 1.3 mg/dL — ABNORMAL HIGH (ref 0.61–1.24)
Glucose, Bld: 83 mg/dL (ref 70–99)
HCT: 48 % (ref 39.0–52.0)
Hemoglobin: 16.3 g/dL (ref 13.0–17.0)
Potassium: 4.5 mmol/L (ref 3.5–5.1)
Sodium: 143 mmol/L (ref 135–145)
TCO2: 29 mmol/L (ref 22–32)

## 2019-09-21 SURGERY — ABDOMINAL AORTOGRAM W/LOWER EXTREMITY
Anesthesia: LOCAL | Laterality: Bilateral

## 2019-09-21 MED ORDER — ONDANSETRON HCL 4 MG/2ML IJ SOLN: 4.0000 mg | Freq: Four times a day (QID) | INTRAMUSCULAR | Status: DC | PRN

## 2019-09-21 MED ORDER — SODIUM CHLORIDE 0.9 % IV SOLN: 250.0000 mL | INTRAVENOUS | Status: DC | PRN

## 2019-09-21 MED ORDER — MIDAZOLAM HCL 2 MG/2ML IJ SOLN
INTRAMUSCULAR | Status: DC | PRN
  Administered 2019-09-21 (×2): 1 mg via INTRAVENOUS

## 2019-09-21 MED ORDER — MIDAZOLAM HCL 2 MG/2ML IJ SOLN
INTRAMUSCULAR | Status: AC
  Filled 2019-09-21: qty 2

## 2019-09-21 MED ORDER — FENTANYL CITRATE (PF) 100 MCG/2ML IJ SOLN
INTRAMUSCULAR | Status: DC | PRN
  Administered 2019-09-21 (×2): 50 ug via INTRAVENOUS

## 2019-09-21 MED ORDER — HYDRALAZINE HCL 20 MG/ML IJ SOLN
INTRAMUSCULAR | Status: AC
  Filled 2019-09-21: qty 1

## 2019-09-21 MED ORDER — LIDOCAINE HCL (PF) 1 % IJ SOLN
INTRAMUSCULAR | Status: AC
  Filled 2019-09-21: qty 30

## 2019-09-21 MED ORDER — HEPARIN (PORCINE) IN NACL 1000-0.9 UT/500ML-% IV SOLN
INTRAVENOUS | Status: DC | PRN
  Administered 2019-09-21 (×2): 500 mL

## 2019-09-21 MED ORDER — IODIXANOL 320 MG/ML IV SOLN
INTRAVENOUS | Status: DC | PRN
  Administered 2019-09-21: 150 mL

## 2019-09-21 MED ORDER — LABETALOL HCL 5 MG/ML IV SOLN
10.0000 mg | INTRAVENOUS | Status: DC | PRN
  Administered 2019-09-21: 10 mg via INTRAVENOUS

## 2019-09-21 MED ORDER — LIDOCAINE HCL (PF) 1 % IJ SOLN
INTRAMUSCULAR | Status: DC | PRN
  Administered 2019-09-21: 15 mL via INTRADERMAL

## 2019-09-21 MED ORDER — ACETAMINOPHEN 325 MG PO TABS
ORAL_TABLET | ORAL | Status: AC
  Filled 2019-09-21: qty 2

## 2019-09-21 MED ORDER — HYDRALAZINE HCL 20 MG/ML IJ SOLN: 5.0000 mg | INTRAMUSCULAR | Status: DC | PRN

## 2019-09-21 MED ORDER — SODIUM CHLORIDE 0.9 % IV SOLN: INTRAVENOUS | Status: DC

## 2019-09-21 MED ORDER — ACETAMINOPHEN 325 MG PO TABS
650.0000 mg | ORAL_TABLET | ORAL | Status: DC | PRN
  Administered 2019-09-21: 650 mg via ORAL

## 2019-09-21 MED ORDER — SODIUM CHLORIDE 0.9 % WEIGHT BASED INFUSION: 1.0000 mL/kg/h | INTRAVENOUS | Status: DC

## 2019-09-21 MED ORDER — SODIUM CHLORIDE 0.9% FLUSH: 3.0000 mL | INTRAVENOUS | Status: DC | PRN

## 2019-09-21 MED ORDER — SODIUM CHLORIDE 0.9% FLUSH: 3.0000 mL | Freq: Two times a day (BID) | INTRAVENOUS | Status: DC

## 2019-09-21 MED ORDER — HYDRALAZINE HCL 20 MG/ML IJ SOLN
INTRAMUSCULAR | Status: DC | PRN
  Administered 2019-09-21: 10 mg via INTRAVENOUS

## 2019-09-21 MED ORDER — LABETALOL HCL 5 MG/ML IV SOLN
INTRAVENOUS | Status: AC
  Filled 2019-09-21: qty 4

## 2019-09-21 MED ORDER — FENTANYL CITRATE (PF) 100 MCG/2ML IJ SOLN
INTRAMUSCULAR | Status: AC
  Filled 2019-09-21: qty 2

## 2019-09-21 MED ORDER — HEPARIN (PORCINE) IN NACL 1000-0.9 UT/500ML-% IV SOLN
INTRAVENOUS | Status: AC
  Filled 2019-09-21: qty 1000

## 2019-09-21 MED ORDER — OXYCODONE HCL 5 MG PO TABS: 5.0000 mg | ORAL_TABLET | ORAL | 0 refills | Status: DC | PRN

## 2019-09-21 SURGICAL SUPPLY — 17 items
BAG SNAP BAND KOVER 36X36 (MISCELLANEOUS) ×1 IMPLANT
CATH ANGIO 5F PIGTAIL 65CM (CATHETERS) ×1 IMPLANT
CATH CROSS OVER TEMPO 5F (CATHETERS) ×1 IMPLANT
CATH STRAIGHT 5FR 65CM (CATHETERS) ×1 IMPLANT
CLOSURE MYNX CONTROL 5F (Vascular Products) ×1 IMPLANT
COVER DOME SNAP 22 D (MISCELLANEOUS) ×1 IMPLANT
KIT MICROPUNCTURE NIT STIFF (SHEATH) ×1 IMPLANT
KIT PV (KITS) ×2 IMPLANT
PROTECTION STATION PRESSURIZED (MISCELLANEOUS) ×2
SHEATH PINNACLE 5F 10CM (SHEATH) ×1 IMPLANT
SHEATH PROBE COVER 6X72 (BAG) ×1 IMPLANT
STATION PROTECTION PRESSURIZED (MISCELLANEOUS) IMPLANT
SYR MEDRAD MARK V 150ML (SYRINGE) ×1 IMPLANT
TRANSDUCER W/STOPCOCK (MISCELLANEOUS) ×2 IMPLANT
TRAY PV CATH (CUSTOM PROCEDURE TRAY) ×2 IMPLANT
TUBING INJECTOR 48 (MISCELLANEOUS) ×1 IMPLANT
WIRE BENTSON .035X145CM (WIRE) ×1 IMPLANT

## 2019-09-21 NOTE — Interval H&P Note (Signed)
History and Physical Interval Note:  09/21/2019 1:37 PM  Benjamin Espinoza  has presented today for surgery, with the diagnosis of Claudication.  The various methods of treatment have been discussed with the patient and family. After consideration of risks, benefits and other options for treatment, the patient has consented to  Procedure(s): ABDOMINAL AORTOGRAM W/LOWER EXTREMITY (Bilateral) as a surgical intervention.  The patient's history has been reviewed, patient examined, no change in status, stable for surgery.  I have reviewed the patient's chart and labs.  Questions were answered to the patient's satisfaction.     [name]

## 2019-09-21 NOTE — Op Note (Signed)
PATIENT: Benjamin Espinoza      MRN: [id number] DOB: 1966-01-11    DATE OF PROCEDURE: 09/21/2019  INDICATIONS:    Benjamin Espinoza is a 54 y.o. male who I saw in the emergency department on 09/04/2019 with right lower extremity claudication.  He had symptoms for several months and the symptoms were worse on the right side.  He had smoked up to 1-1/2 packs of cigarettes per day for many years but did cut back to 3 cigarettes a day.  Has had a previous myocardial infarction and has undergone previous coronary revascularization.  Vein was taken from the right leg.  He also has a history of congestive heart failure.  He was set up for arteriography  PROCEDURE:    1.  Conscious sedation 2.  Ultrasound-guided access to the left common femoral artery 3.  Aortogram with bilateral iliac arteriogram and bilateral lower extremity runoff 4.  Selective catheterization of the right external iliac artery (second order catheterization) 5.  Mynx closure of left common femoral artery  SURGEON: Di Kindle. [name], MD, FACS  ANESTHESIA: Local with sedation  EBL: Minimal  TECHNIQUE: The patient was brought to the peripheral vascular lab and was sedated. The period of conscious sedation was a 51 minutes.  During that time period, I was present face-to-face 100% of the time.  The patient was administered 1 mg of Versed and 50 mcg of fentanyl initially.  He later received an additional 1 mg of Versed and 50 mcg hello fentanyl. The patient's heart rate, blood pressure, and oxygen saturation were monitored by the nurse continuously during the procedure.  Both groins were prepped and draped in the usual sterile fashion.  Under ultrasound guidance, after the skin was anesthetized, I cannulated the left common femoral artery with a micropuncture needle and a micropuncture sheath was introduced over a wire.  This was exchanged for a 5 [place] sheath over a Bentson wire.  By ultrasound the femoral artery was patent. A  real-time image was obtained and sent to the server.  The pigtail catheter was positioned at the L1 vertebral body and flush aortogram obtained.  The catheter was in position above the aortic bifurcation and an oblique iliac projection was obtained.  Next bilateral lower extremity runoff films were obtained.  In order to obtain better visualization on the right I exchanged the pigtail catheter for a crossover catheter which was positioned into the right common iliac artery.  Next I advanced the wire into the external iliac artery and exchanged the crossover catheter for straight catheter.right external iliac arteriogram was obtained with right lower extremity runoff.  At the completion of the procedure the catheter was removed.  I then advanced the minx closure device through the sheath and the balloon was inflated.  The device was retracted to the appropriate tension and then deployed.  After 2 minutes pressure was held and the balloon deflated an additional 10 minutes of pressure was maintained.  No immediate complications were noted.  FINDINGS:   RENALS: He has single renal arteries bilaterally with no significant renal artery stenosis identified.  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  There is no focal stenosis noted.  RIGHT INFLOW: He has moderate diffuse disease of the right common iliac artery.  There is a tight stenosis of the proximal hypogastric artery.  There is mild diffuse disease of the right external iliac artery.  RIGHT LOWER EXTREMITY RUNOFF: The common femoral and deep femoral artery are patent.  The superficial femoral artery is patent proximally.  The superficial femoral artery is occluded in the proximal thigh and is reconstitution of the above-knee popliteal artery.  The below-knee popliteal artery segment is patent.  The anterior tibial artery is occluded.  There is dominant runoff via the peroneal artery only.  The posterior tibial artery is occluded with  reconstitution of the distal posterior tibial artery via collaterals from the peroneal.  LEFT INFLOW: The left common iliac artery is patent.  The hypogastric artery appears to be occluded.  There is a 40% stenosis in the proximal left external iliac artery with mild disease of the external iliac artery.  LEFT LOWER EXTREMITY RUNOFF: The common femoral and deep femoral artery are patent.  The superficial femoral artery is patent proximally.  There is a focal 90% stenosis in the distal SFA with moderate diffuse disease below that.  The above-knee popliteal artery is patent as is the below-knee popliteal artery.  The anterior tibial artery is occluded.  The posterior tibial artery is occluded.  There is dominant runoff via the peroneal artery which reconstitutes the posterior tibial artery distally.  CLINICAL NOTE: This patient has significant right lower extremity claudication and moderate left lower extremity claudication.  I have again discussed with him the importance of tobacco cessation.  We also discussed a structured walking program.  In addition we have discussed the importance of nutrition specifically have recommended largely a plant-based diet.  He is on aspirin and is on a statin.  I will see him back in the office in 3 to 4 weeks.  He is previously had his vein taken from the right leg for CABG.  We will map veins in both legs.  If his symptoms are not tolerable we would have to consider him for a right femoropopliteal bypass.  This could potentially have to be done with prosthetic graft.  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  Of note given his cardiac history he would also need preoperative cardiac evaluation.  In addition I will obtain an ultrasound of his abdominal aorta given the ectasia in the distal aorta seen on his angiogram.   [name], MD, FACS [facility]  DATE OF DICTATION:   09/21/2019

## 2019-09-21 NOTE — Discharge Instructions (Signed)
Angiogram, Care After This sheet gives you information about how to care for yourself after your procedure. Your health care provider may also give you more specific instructions. If you have problems or questions, contact your health care provider. What can I expect after the procedure? After the procedure, it is common to have bruising and tenderness at the catheter insertion area. Follow these instructions at home: Insertion site care  Follow instructions from your health care provider about how to take care of your insertion site. Make sure you: ? Wash your hands with soap and water before you change your bandage (dressing). If soap and water are not available, use hand sanitizer. ? Change your dressing as told by your health care provider. ? Leave stitches (sutures), skin glue, or adhesive strips in place. These skin closures may need to stay in place for 2 weeks or longer. If adhesive strip edges start to loosen and curl up, you may trim the loose edges. Do not remove adhesive strips completely unless your health care provider tells you to do that.  Do not take baths, swim, or use a hot tub until your health care provider approves.  You may shower 24-48 hours after the procedure or as told by your health care provider. ? Gently wash the site with plain soap and water. ? Pat the area dry with a clean towel. ? Do not rub the site. This may cause bleeding.  Do not apply powder or lotion to the site. Keep the site clean and dry.  Check your insertion site every day for signs of infection. Check for: ? Redness, swelling, or pain. ? Fluid or blood. ? Warmth. ? Pus or a bad smell. Activity  Rest as told by your health care provider, usually for 1-2 days.  Do not lift anything that is heavier than 10 lbs. (4.5 kg) or as told by your health care provider.  Do not drive for 24 hours if you were given a medicine to help you relax (sedative).  Do not drive or use heavy machinery while  taking prescription pain medicine. General instructions   Return to your normal activities as told by your health care provider, usually in about a week. Ask your health care provider what activities are safe for you.  If the catheter site starts bleeding, lie flat and put pressure on the site. If the bleeding does not stop, get help right away. This is a medical emergency.  Drink enough fluid to keep your urine clear or pale yellow. This helps flush the contrast dye from your body.  Take over-the-counter and prescription medicines only as told by your health care provider.  Keep all follow-up visits as told by your health care provider. This is important. Contact a health care provider if:  You have a fever or chills.  You have redness, swelling, or pain around your insertion site.  You have fluid or blood coming from your insertion site.  The insertion site feels warm to the touch.  You have pus or a bad smell coming from your insertion site.  You have bruising around the insertion site.  You notice blood collecting in the tissue around the catheter site (hematoma). The hematoma may be painful to the touch. Get help right away if:  You have severe pain at the catheter insertion area.  The catheter insertion area swells very fast.  The catheter insertion area is bleeding, and the bleeding does not stop when you hold steady pressure on the area.    The area near or just beyond the catheter insertion site becomes pale, cool, tingly, or numb. These symptoms may represent a serious problem that is an emergency. Do not wait to see if the symptoms will go away. Get medical help right away. Call your local emergency services (911 in the [place] Do not drive yourself to the hospital. Summary  After the procedure, it is common to have bruising and tenderness at the catheter insertion area.  After the procedure, it is important to rest and drink plenty of fluids.  Do not take baths,  swim, or use a hot tub until your health care provider says it is okay to do so. You may shower 24-48 hours after the procedure or as told by your health care provider.  If the catheter site starts bleeding, lie flat and put pressure on the site. If the bleeding does not stop, get help right away. This is a medical emergency. This information is not intended to replace advice given to you by your health care provider. Make sure you discuss any questions you have with your health care provider. Document Revised: 01/28/2017 Document Reviewed: 01/21/2016 Elsevier Patient Education  2020 [employer]  

## 2019-09-24 ENCOUNTER — Encounter (HOSPITAL_COMMUNITY): Payer: Self-pay | Admitting: Vascular Surgery

## 2019-09-26 NOTE — Unmapped (Signed)
[facility] Repatha shipment will be delayed as a result of no refills remain on the prescription.      I have reached out to the patient and communicated the delay. We will call the patient back to reschedule the delivery upon resolution. We have not confirmed the new delivery date.

## 2019-09-27 DIAGNOSIS — E785 Hyperlipidemia, unspecified: Principal | ICD-10-CM

## 2019-09-27 NOTE — Unmapped (Signed)
[facility] Repatha shipment will be canceled  as a result of no refills remain on the prescription.      I have reached out to the patient and left a voicemail message.  We will call the patient back to reschedule the delivery upon resolution. We have canceled this work request.

## 2019-09-28 ENCOUNTER — Encounter: Payer: Medicare HMO | Admitting: Vascular Surgery

## 2019-09-28 ENCOUNTER — Encounter (HOSPITAL_COMMUNITY): Payer: Medicare HMO

## 2019-10-02 ENCOUNTER — Ambulatory Visit: Payer: Medicare HMO | Admitting: Cardiology

## 2019-10-04 DIAGNOSIS — F17200 Nicotine dependence, unspecified, uncomplicated: Secondary | ICD-10-CM | POA: Insufficient documentation

## 2019-10-04 DIAGNOSIS — E785 Hyperlipidemia, unspecified: Secondary | ICD-10-CM | POA: Insufficient documentation

## 2019-10-04 DIAGNOSIS — I10 Essential (primary) hypertension: Secondary | ICD-10-CM | POA: Insufficient documentation

## 2019-10-04 DIAGNOSIS — Z951 Presence of aortocoronary bypass graft: Secondary | ICD-10-CM | POA: Insufficient documentation

## 2019-10-04 DIAGNOSIS — I219 Acute myocardial infarction, unspecified: Secondary | ICD-10-CM | POA: Insufficient documentation

## 2019-10-05 ENCOUNTER — Ambulatory Visit (INDEPENDENT_AMBULATORY_CARE_PROVIDER_SITE_OTHER): Payer: Medicare HMO | Admitting: Cardiology

## 2019-10-05 ENCOUNTER — Other Ambulatory Visit: Payer: Self-pay | Admitting: Physician Assistant

## 2019-10-05 ENCOUNTER — Encounter: Payer: Self-pay | Admitting: Cardiology

## 2019-10-05 ENCOUNTER — Other Ambulatory Visit: Payer: Self-pay

## 2019-10-05 ENCOUNTER — Telehealth: Payer: Self-pay | Admitting: *Deleted

## 2019-10-05 VITALS — BP 130/80 | HR 80 | Ht 73.0 in | Wt 197.0 lb

## 2019-10-05 DIAGNOSIS — Z951 Presence of aortocoronary bypass graft: Secondary | ICD-10-CM

## 2019-10-05 DIAGNOSIS — I10 Essential (primary) hypertension: Secondary | ICD-10-CM

## 2019-10-05 DIAGNOSIS — I739 Peripheral vascular disease, unspecified: Secondary | ICD-10-CM

## 2019-10-05 DIAGNOSIS — R072 Precordial pain: Secondary | ICD-10-CM | POA: Diagnosis not present

## 2019-10-05 DIAGNOSIS — I2510 Atherosclerotic heart disease of native coronary artery without angina pectoris: Secondary | ICD-10-CM

## 2019-10-05 DIAGNOSIS — E782 Mixed hyperlipidemia: Secondary | ICD-10-CM

## 2019-10-05 DIAGNOSIS — R079 Chest pain, unspecified: Secondary | ICD-10-CM

## 2019-10-05 DIAGNOSIS — F17200 Nicotine dependence, unspecified, uncomplicated: Secondary | ICD-10-CM

## 2019-10-05 MED ORDER — OXYCODONE HCL 5 MG PO TABS: 5.0000 mg | ORAL_TABLET | ORAL | 0 refills | Status: DC | PRN

## 2019-10-05 MED ORDER — RANOLAZINE ER 500 MG PO TB12: 500.0000 mg | ORAL_TABLET | Freq: Two times a day (BID) | ORAL | 3 refills | Status: DC

## 2019-10-05 NOTE — Progress Notes (Signed)
Cardiology Office Note:    Date:  10/05/2019   ID:  Benjamin Espinoza, DOB 11-11-1965, MRN [id number]  PCP:  System, Pcp Not In  Cardiologist:  No primary care provider on file.  Electrophysiologist:  None   Referring MD: [name],*   Chief Complaint  Patient presents with   Pre-op Exam    Bypass/Dr. Durwin Nora   History of Present Illness:    Benjamin Espinoza is a 54 y.o. male with a hx of coronary artery disease status post CABG in 2017 in [place], on April 24, 2019 the patient did have unstable angina was transferred to [facility] where he underwent a left heart catheterization showing all his grafts were patent, hypertension, hyperlipidemia, prediabetes, tobacco use and degenerative disc disease.   The patient presents today reports that he is here for preoperative clearance.  He is looking forward to undergoing a vascular surgery.  At the same time he tells me that he has been experiencing intermittent left-sided chest pressure.  He said mostly when he is walking up an incline he gets this sensation.  Is associated with shortness of breath.  He denies any radiation of this pressure-like sensation.  But tells me is similar to when he had his event back in 2017 but the intensity is just less.  When he is focusing only in the office today as well is the fact that he feel he is having significant leg pain and he has not had any of his pain medications.  During our visit I had to redirect the patient to helping me understand the fact that he is experiencing chest pressure for [place] to be able to make decisions given his risk factors and his upcoming procedure.     Past Medical History:  Diagnosis Date   Atypical chest pain 05/07/2017   Essential hypertension    Hx of CABG x4 01/14/2016 [place]   Hyperlipidemia    Myocardial infarction [facility])    PVD (peripheral vascular disease) (HCC) 09/21/2019   Tobacco use disorder     Past Surgical History:  Procedure  Laterality Date   ABDOMINAL AORTOGRAM W/LOWER EXTREMITY Bilateral 09/21/2019   Procedure: ABDOMINAL AORTOGRAM W/LOWER EXTREMITY;  Surgeon: [name], MD;  Location: [facility] INVASIVE CV LAB;  Service: Cardiovascular;  Laterality: Bilateral;   CORONARY ARTERY BYPASS GRAFT      Current Medications: Current Meds  Medication Sig   aspirin EC 81 MG tablet Take 2 tablets (162 mg total) by mouth daily.   atorvastatin (LIPITOR) 80 MG tablet Take 80 mg by mouth daily.   cilostazol (PLETAL) 100 MG tablet Take 100 mg by mouth 2 (two) times daily.   gabapentin (NEURONTIN) 600 MG tablet Take 600 mg by mouth daily as needed (Pain).      Allergies:   Patient has no known allergies.   Social History   Socioeconomic History   Marital status: Married    Spouse name: Not on file   Number of children: Not on file   Years of education: Not on file   Highest education level: Not on file  Occupational History   Not on file  Tobacco Use   Smoking status: Current Every Day Smoker    Types: Cigars   Smokeless tobacco: Never Used  Vaping Use   Vaping Use: Never used  Substance and Sexual Activity   Alcohol use: No   Drug use: No   Sexual activity: Not on file  Other Topics Concern   Not on  file  Social History Narrative   Not on file   Social Determinants of Health   Financial Resource Strain:    Difficulty of Paying Living Expenses:   Food Insecurity:    Worried About Programme researcher, broadcasting/film/video in the Last Year:    Barista in the Last Year:   Transportation Needs:    Freight forwarder (Medical):    Lack of Transportation (Non-Medical):   Physical Activity:    Days of Exercise per Week:    Minutes of Exercise per Session:   Stress:    Feeling of Stress :   Social Connections:    Frequency of Communication with Friends and Family:    Frequency of Social Gatherings with Friends and Family:    Attends Religious Services:    Active Member of  Clubs or Organizations:    Attends Banker Meetings:    Marital Status:      Family History: The patient's family history is not on file.  ROS:   Review of Systems  Constitution: Negative for decreased appetite, fever and weight gain.  HENT: Negative for congestion, ear discharge, hoarse voice and sore throat.   Eyes: Negative for discharge, redness, vision loss in right eye and visual halos.  Cardiovascular: Reports chest pain, dyspnea on exertion.  Negative for leg swelling, orthopnea and palpitations.  Respiratory: Negative for cough, hemoptysis, shortness of breath and snoring.   Endocrine: Negative for heat intolerance and polyphagia.  Hematologic/Lymphatic: Negative for bleeding problem. Does not bruise/bleed easily.  Skin: Negative for flushing, nail changes, rash and suspicious lesions.  Musculoskeletal: Negative for arthritis, joint pain, muscle cramps, myalgias, neck pain and stiffness.  Gastrointestinal: Negative for abdominal pain, bowel incontinence, diarrhea and excessive appetite.  Genitourinary: Negative for decreased libido, genital sores and incomplete emptying.  Neurological: Negative for brief paralysis, focal weakness, headaches and loss of balance.  Psychiatric/Behavioral: Negative for altered mental status, depression and suicidal ideas.  Allergic/Immunologic: Negative for HIV exposure and persistent infections.    EKGs/Labs/Other Studies Reviewed:    The following studies were reviewed today:   EKG:  The ekg ordered today demonstrates sinus rhythm, heart rate 76 bpm nonspecific ST change with T wave version in aVL.  April 24, 2019 at [facility] Coronary artery disease including severe native 3-vessel coronary artery disease (60% ostial LMCA stenosis, complete occlusion of the LAD at the ostium, severe diffuse disease in OM1 with up to 80% disease proximally,  complete occlusion of RCA proximally).  Bypass grafts (LIMA-LAD, VG to OM1, VG to OM2  and VG to PDA) all widely  patent.    Echo impression in February 2021 summary  1. The left ventricle is normal in size with moderately increased wall  thickness.  2. The left ventricular systolic function is normal, LVEF is visually  estimated at > 55%.  3. There is grade I diastolic dysfunction (impaired relaxation).  4. There is mild aortic regurgitation.  5. The aorta at the sinuses of Valsalva is mildly dilated.  6. The right ventricle is normal in size, with normal systolic function.    Left Ventricle  The left ventricle is normal in size with moderately increased wall  thickness.  The left ventricular systolic function is normal, LVEF is visually estimated  at > 55%.  There is grade I diastolic dysfunction (impaired relaxation).  LV global longitudinal strain: -14.9 %.   Right Ventricle  The right ventricle is normal in size, with normal systolic function.  Left Atrium  The left atrium is normal in size.   Right Atrium  The right atrium is normal in size.    Aortic Valve  The aortic valve is trileaflet with normal appearing leaflets with normal  excursion.  There is mild aortic regurgitation.  There is no evidence of a significant transvalvular gradient.   Pulmonic Valve  The pulmonic valve is poorly visualized, but probably normal.  There is mild pulmonic regurgitation.  There is no evidence of a significant transvalvular gradient.   Mitral Valve  The mitral valve leaflets are mildly thickened with normal leaflet mobility.  There is no significant mitral valve regurgitation.   Tricuspid Valve  The tricuspid valve leaflets are normal, with normal leaflet mobility.  There is trivial tricuspid regurgitation.  There isno pulmonary hypertension, estimated pulmonary artery systolic  pressure is 21 mmHg.    Pericardium/Pleural  There is no pericardial effusion.   Inferior Vena Cava  IVC size and  inspiratory change suggest normal right atrial pressure. (0-5  mmHg).   Aorta  The aorta at the sinuses of Valsalva is mildly dilated.    Pulmonic Valve   Recent Labs: 09/04/2019: Platelets 273 09/21/2019: BUN 14; Creatinine, Ser 1.30; Hemoglobin 16.3; Potassium 4.5; Sodium 143  Recent Lipid Panel    Component Value Date/Time   CHOL 191 05/07/2017 1555   TRIG 61 05/07/2017 1555   HDL 27 (L) 05/07/2017 1555   CHOLHDL 7.1 05/07/2017 1555   VLDL 12 05/07/2017 1555   LDLCALC 152 (H) 05/07/2017 1555    Physical Exam:    VS:  BP 130/80 (BP Location: Right Arm, Patient Position: Sitting, Cuff Size: Normal)    Pulse 80    Ht 6\' 1"  (1.854 m)    Wt 197 lb (89.4 kg)    SpO2 98%    BMI 25.99 kg/m     Wt Readings from Last 3 Encounters:  10/05/19 197 lb (89.4 kg)  09/21/19 200 lb (90.7 kg)  09/20/19 197 lb (89.4 kg)     GEN: Well nourished, well developed in no acute distress HEENT: Normal NECK: No JVD; No carotid bruits LYMPHATICS: No lymphadenopathy CARDIAC: S1S2 noted,RRR, no murmurs, rubs, gallops RESPIRATORY:  Clear to auscultation without rales, wheezing or rhonchi  ABDOMEN: Soft, non-tender, non-distended, +bowel sounds, no guarding. EXTREMITIES: No edema, No cyanosis, no clubbing MUSCULOSKELETAL:  No deformity  SKIN: Warm and dry NEUROLOGIC:  Alert and oriented x 3, non-focal PSYCHIATRIC:  Normal affect, good insight  ASSESSMENT:    1. PVD (peripheral vascular disease) (HCC)   2. Essential hypertension   3. Hx of CABG   4. Mixed hyperlipidemia   5. Tobacco use disorder   6. Coronary artery disease involving native heart, angina presence unspecified, unspecified vessel or lesion type   7. Chest pain of uncertain etiology    PLAN:     History of pressure is concerned he does have.  Nitroglycerin that he is taking and send this was not relief.  Given his upcoming vascular surgery and his reported mets due to his pain is less than 4, is appropriate at this time to  pursue a pharmacologic nuclear stress test in this patient.  I reviewed his left heart catheterization back in 05/2019 which showed patent grafts from his CABG in 2017.  But he notes that the pain for his chest just started about a little over a month ago.  In the meantime I also will order Ranexa 500 mg twice a day for the patient to  help with angina as he told me that he does not respond well to Imdur.  Continue his aspirin 81 mg daily, atorvastatin 80 mg daily.  Hypertension his blood pressure is acceptable in the office we will continue his current medication regimen.  He took lisinopril he does not know the doses he will call us with this information.  Tobacco use-smoking cessation advised but it does not seem that the patient is looking to quit smoking at this time as he tells me his wife also is a avid smoker.  The patient is in agreement with the above plan. The patient left the office in stable condition.  The patient will follow up in 2 months or sooner if needed.   Medication Adjustments/Labs and Tests Ordered: Current medicines are reviewed at length with the patient today.  Concerns regarding medicines are outlined above.  No orders of the defined types were placed in this encounter.  No orders of the defined types were placed in this encounter.   Patient Instructions   Medication Instructions:  Your physician has recommended you make the following change in your medication:   Start Ranaxa 500 mg twice daily.  *If you need a refill on your cardiac medications before your next appointment, please call your pharmacy*   Lab Work: None ordered If you have labs (blood work) drawn today and your tests are completely normal, you will receive your results only by:  MyChart Message (if you have MyChart) OR  A paper copy in the mail If you have any lab test that is abnormal or we need to change your treatment, we will call you to review the results.   Testing/Procedures: Your  physician has requested that you have a lexiscan myoview. For further information please visit [email or web address] Please follow instruction sheet, as given.  The test will take approximately 3 to 4 hours to complete; you may bring reading material.  If someone comes with you to your appointment, they will need to remain in the main lobby due to limited space in the testing area.  How to prepare for your Myocardial Perfusion Test:  Do not eat or drink 3 hours prior to your test, except you may have water.  Do not consume products containing caffeine (regular or decaffeinated) 12 hours prior to your test. (ex: coffee, chocolate, sodas, tea).  Do bring a list of your current medications with you.  If not listed below, you may take your medications as normal.  Do wear comfortable clothes (no dresses or overalls) and walking shoes, tennis shoes preferred (No heels or open toe shoes are allowed).  Do NOT wear cologne, perfume, aftershave, or lotions (deodorant is allowed).  If these instructions are not followed, your test will have to be rescheduled.     Follow-Up: At [facility], you and your health needs are our priority.  As part of our continuing mission to provide you with exceptional heart care, we have created designated Provider Care Teams.  These Care Teams include your primary Cardiologist (physician) and Advanced Practice Providers (APPs -  Physician Assistants and Nurse Practitioners) who all work together to provide you with the care you need, when you need it.  We recommend signing up for the patient portal called "MyChart".  Sign up information is provided on this After Visit Summary.  MyChart is used to connect with patients for Virtual Visits (Telemedicine).  Patients are able to view lab/test results, encounter notes, upcoming appointments, etc.  Non-urgent messages can be sent to  your provider as well.   To learn more about what you can do with MyChart, go to  [email or web address].    Your next appointment:   2 month(s)  The format for your next appointment:   In Person  Provider:   Thomasene Ripple, DO   Other Instructions raRanolazine tablets, extended release What is this medicine? RANOLAZINE (ra NOE la zeen) is a heart medicine. It is used to treat chronic chest pain (angina). This medicine must be taken regularly. It will not relieve an acute episode of chest pain. This medicine may be used for other purposes; ask your health care provider or pharmacist if you have questions. COMMON BRAND NAME(S): Ranexa What should I tell my health care provider before I take this medicine? They need to know if you have any of these conditions:  heart disease  irregular heartbeat  kidney disease  liver disease  low levels of potassium or magnesium in the blood  an unusual or allergic reaction to ranolazine, other medicines, foods, dyes, or preservatives  pregnant or trying to get pregnant  breast-feeding How should I use this medicine? Take this medicine by mouth with a glass of water. Follow the directions on the prescription label. Do not cut, crush, or chew this medicine. Take with or without food. Do not take this medication with grapefruit juice. Take your doses at regular intervals. Do not take your medicine more often then directed. Talk to your pediatrician regarding the use of this medicine in children. Special care may be needed. Overdosage: If you think you have taken too much of this medicine contact a poison control center or emergency room at once. NOTE: This medicine is only for you. Do not share this medicine with others. What if I miss a dose? If you miss a dose, take it as soon as you can. If it is almost time for your next dose, take only that dose. Do not take double or extra doses. What may interact with this medicine? Do not take this medicine with any of the following medications:  antivirals for HIV or  AIDS  cerivastatin  certain antibiotics like chloramphenicol, clarithromycin, dalfopristin; quinupristin, isoniazid, rifabutin, rifampin, rifapentine  certain medicines used for cancer like imatinib, nilotinib  certain medicines for fungal infections like fluconazole, itraconazole, ketoconazole, posaconazole, voriconazole  certain medicines for irregular heart beat like dronedarone  certain medicines for seizures like carbamazepine, fosphenytoin, oxcarbazepine, phenobarbital, phenytoin  cisapride  conivaptan  cyclosporine  grapefruit or grapefruit juice  lumacaftor; ivacaftor  nefazodone  pimozide  quinacrine  St John's wort  thioridazine This medicine may also interact with the following medications:  alfuzosin  certain medicines for depression, anxiety, or psychotic disturbances like bupropion, citalopram, fluoxetine, fluphenazine, paroxetine, perphenazine, risperidone, sertraline, trifluoperazine  certain medicines for cholesterol like atorvastatin, lovastatin, simvastatin  certain medicines for stomach problems like octreotide, palonosetron, prochlorperazine  eplerenone  ergot alkaloids like dihydroergotamine, ergonovine, ergotamine, methylergonovine  metformin  nicardipine  other medicines that prolong the QT interval (cause an abnormal heart rhythm) like dofetilide, ziprasidone  sirolimus  tacrolimus This list may not describe all possible interactions. Give your health care provider a list of all the medicines, herbs, non-prescription drugs, or dietary supplements you use. Also tell them if you smoke, drink alcohol, or use illegal drugs. Some items may interact with your medicine. What should I watch for while using this medicine? Visit your doctor for regular check ups. Tell your doctor or healthcare professional if your symptoms do not start to get  better or if they get worse. This medicine will not relieve an acute attack of angina or chest  pain. This medicine can change your heart rhythm. Your health care provider may check your heart rhythm by ordering an electrocardiogram (ECG) while you are taking this medicine. You may get drowsy or dizzy. Do not drive, use machinery, or do anything that needs mental alertness until you know how this medicine affects you. Do not stand or sit up quickly, especially if you are an older patient. This reduces the risk of dizzy or fainting spells. Alcohol may interfere with the effect of this medicine. Avoid alcoholic drinks. If you are scheduled for any medical or dental procedure, tell your healthcare provider that you are taking this medicine. This medicine can interact with other medicines used during surgery. What side effects may I notice from receiving this medicine? Side effects that you should report to your doctor or health care professional as soon as possible:  allergic reactions like skin rash, itching or hives, swelling of the face, lips, or tongue  breathing problems  changes in vision  fast, irregular or pounding heartbeat  feeling faint or lightheaded, falls  low or high blood pressure  numbness or tingling feelings  ringing in the ears  tremor or shakiness  slow heartbeat (fewer than 50 beats per minute)  swelling of the legs or feet Side effects that usually do not require medical attention (report to your doctor or health care professional if they continue or are bothersome):  constipation  drowsy  dry mouth  headache  nausea or vomiting  stomach upset This list may not describe all possible side effects. Call your doctor for medical advice about side effects. You may report side effects to FDA at 1-800-FDA-1088. Where should I keep my medicine? Keep out of the reach of children. Store at room temperature between 15 and 30 degrees C (59 and 86 degrees F). Throw away any unused medicine after the expiration date. NOTE: This sheet is a summary. It may not  cover all possible information. If you have questions about this medicine, talk to your doctor, pharmacist, or health care provider.  2020 Elsevier/Gold Standard (2018-02-07 09:18:49)      Adopting a Healthy Lifestyle.  Know what a healthy weight is for you (roughly BMI <25) and aim to maintain this   Aim for 7+ servings of fruits and vegetables daily   65-80+ fluid ounces of water or unsweet tea for healthy kidneys   Limit to max 1 drink of alcohol per day; avoid smoking/tobacco   Limit animal fats in diet for cholesterol and heart health - choose grass fed whenever available   Avoid highly processed foods, and foods high in saturated/trans fats   Aim for low stress - take time to unwind and care for your mental health   Aim for 150 min of moderate intensity exercise weekly for heart health, and weights twice weekly for bone health   Aim for 7-9 hours of sleep daily   When it comes to diets, agreement about the perfect plan isnt easy to find, even among the experts. Experts at the [facility] [employer] developed an idea known as the Healthy Eating Plate. Just imagine a plate divided into logical, healthy portions.   The emphasis is on diet quality:   Load up on vegetables and fruits - one-half of your plate: Aim for color and variety, and remember that potatoes dont count.   Go for whole grains -  one-quarter of your plate: Whole wheat, barley, wheat berries, quinoa, oats, brown rice, and foods made with them. If you want pasta, go with whole wheat pasta.   Protein power - one-quarter of your plate: Fish, chicken, beans, and nuts are all healthy, versatile protein sources. Limit red meat.   The diet, however, does go beyond the plate, offering a few other suggestions.   Use healthy plant oils, such as olive, canola, soy, corn, sunflower and peanut. Check the labels, and avoid partially hydrogenated oil, which have unhealthy trans fats.   If youre thirsty, drink  water. Coffee and tea are good in moderation, but skip sugary drinks and limit milk and dairy products to one or two daily servings.   The type of carbohydrate in the diet is more important than the amount. Some sources of carbohydrates, such as vegetables, fruits, whole grains, and beans-are healthier than others.   Finally, stay active  Signed, Thomasene Ripple, DO  10/05/2019 4:03 PM    [facility]

## 2019-10-05 NOTE — Patient Instructions (Addendum)
Medication Instructions:  Your physician has recommended you make the following change in your medication:   Start Ranaxa 500 mg twice daily.  *If you need a refill on your cardiac medications before your next appointment, please call your pharmacy*   Lab Work: None ordered If you have labs (blood work) drawn today and your tests are completely normal, you will receive your results only by: Marland Kitchen MyChart Message (if you have MyChart) OR . A paper copy in the mail If you have any lab test that is abnormal or we need to change your treatment, we will call you to review the results.   Testing/Procedures: Your physician has requested that you have a lexiscan myoview. For further information please visit [email or web address] Please follow instruction sheet, as given.  The test will take approximately 3 to 4 hours to complete; you may bring reading material.  If someone comes with you to your appointment, they will need to remain in the main lobby due to limited space in the testing area.  How to prepare for your Myocardial Perfusion Test: . Do not eat or drink 3 hours prior to your test, except you may have water. . Do not consume products containing caffeine (regular or decaffeinated) 12 hours prior to your test. (ex: coffee, chocolate, sodas, tea). . Do bring a list of your current medications with you.  If not listed below, you may take your medications as normal. . Do wear comfortable clothes (no dresses or overalls) and walking shoes, tennis shoes preferred (No heels or open toe shoes are allowed). . Do NOT wear cologne, perfume, aftershave, or lotions (deodorant is allowed). . If these instructions are not followed, your test will have to be rescheduled.     Follow-Up: At [facility], you and your health needs are our priority.  As part of our continuing mission to provide you with exceptional heart care, we have created designated Provider Care Teams.  These Care Teams include  your primary Cardiologist (physician) and Advanced Practice Providers (APPs -  Physician Assistants and Nurse Practitioners) who all work together to provide you with the care you need, when you need it.  We recommend signing up for the patient portal called "MyChart".  Sign up information is provided on this After Visit Summary.  MyChart is used to connect with patients for Virtual Visits (Telemedicine).  Patients are able to view lab/test results, encounter notes, upcoming appointments, etc.  Non-urgent messages can be sent to your provider as well.   To learn more about what you can do with MyChart, go to [email or web address].    Your next appointment:   2 month(s)  The format for your next appointment:   In Person  Provider:   Thomasene Ripple, DO   Other Instructions raRanolazine tablets, extended release What is this medicine? RANOLAZINE (ra NOE la zeen) is a heart medicine. It is used to treat chronic chest pain (angina). This medicine must be taken regularly. It will not relieve an acute episode of chest pain. This medicine may be used for other purposes; ask your health care provider or pharmacist if you have questions. COMMON BRAND NAME(S): Ranexa What should I tell my health care provider before I take this medicine? They need to know if you have any of these conditions:  heart disease  irregular heartbeat  kidney disease  liver disease  low levels of potassium or magnesium in the blood  an unusual or allergic reaction to ranolazine, other medicines, foods, dyes, or preservatives  pregnant or trying to get pregnant  breast-feeding How should I use this medicine? Take this medicine by mouth with a glass of water. Follow the directions on the prescription label. Do not cut, crush, or chew this medicine. Take with or without food. Do not take this medication with grapefruit juice. Take your doses at regular intervals. Do not take your medicine more often then  directed. Talk to your pediatrician regarding the use of this medicine in children. Special care may be needed. Overdosage: If you think you have taken too much of this medicine contact a poison control center or emergency room at once. NOTE: This medicine is only for you. Do not share this medicine with others. What if I miss a dose? If you miss a dose, take it as soon as you can. If it is almost time for your next dose, take only that dose. Do not take double or extra doses. What may interact with this medicine? Do not take this medicine with any of the following medications:  antivirals for HIV or AIDS  cerivastatin  certain antibiotics like chloramphenicol, clarithromycin, dalfopristin; quinupristin, isoniazid, rifabutin, rifampin, rifapentine  certain medicines used for cancer like imatinib, nilotinib  certain medicines for fungal infections like fluconazole, itraconazole, ketoconazole, posaconazole, voriconazole  certain medicines for irregular heart beat like dronedarone  certain medicines for seizures like carbamazepine, fosphenytoin, oxcarbazepine, phenobarbital, phenytoin  cisapride  conivaptan  cyclosporine  grapefruit or grapefruit juice  lumacaftor; ivacaftor  nefazodone  pimozide  quinacrine  St John's wort  thioridazine This medicine may also interact with the following medications:  alfuzosin  certain medicines for depression, anxiety, or psychotic disturbances like bupropion, citalopram, fluoxetine, fluphenazine, paroxetine, perphenazine, risperidone, sertraline, trifluoperazine  certain medicines for cholesterol like atorvastatin, lovastatin, simvastatin  certain medicines for stomach problems like octreotide, palonosetron, prochlorperazine  eplerenone  ergot alkaloids like dihydroergotamine, ergonovine, ergotamine, methylergonovine  metformin  nicardipine  other medicines that prolong the QT interval (cause an abnormal heart rhythm) like  dofetilide, ziprasidone  sirolimus  tacrolimus This list may not describe all possible interactions. Give your health care provider a list of all the medicines, herbs, non-prescription drugs, or dietary supplements you use. Also tell them if you smoke, drink alcohol, or use illegal drugs. Some items may interact with your medicine. What should I watch for while using this medicine? Visit your doctor for regular check ups. Tell your doctor or healthcare professional if your symptoms do not start to get better or if they get worse. This medicine will not relieve an acute attack of angina or chest pain. This medicine can change your heart rhythm. Your health care provider may check your heart rhythm by ordering an electrocardiogram (ECG) while you are taking this medicine. You may get drowsy or dizzy. Do not drive, use machinery, or do anything that needs mental alertness until you know how this medicine affects you. Do not stand or sit up quickly, especially if you are an older patient. This reduces the risk of dizzy or fainting spells. Alcohol may interfere with the effect of this medicine. Avoid alcoholic drinks. If you are scheduled for any medical or dental procedure, tell your healthcare provider that you are taking this medicine. This medicine can interact with other medicines used during surgery. What side effects may I notice from receiving this medicine? Side effects that you should report to your doctor or health care professional as soon as possible:  allergic reactions like skin rash, itching or hives, swelling of the face,  lips, or tongue  breathing problems  changes in vision  fast, irregular or pounding heartbeat  feeling faint or lightheaded, falls  low or high blood pressure  numbness or tingling feelings  ringing in the ears  tremor or shakiness  slow heartbeat (fewer than 50 beats per minute)  swelling of the legs or feet Side effects that usually do not require  medical attention (report to your doctor or health care professional if they continue or are bothersome):  constipation  drowsy  dry mouth  headache  nausea or vomiting  stomach upset This list may not describe all possible side effects. Call your doctor for medical advice about side effects. You may report side effects to FDA at 1-800-FDA-1088. Where should I keep my medicine? Keep out of the reach of children. Store at room temperature between 15 and 30 degrees C (59 and 86 degrees F). Throw away any unused medicine after the expiration date. NOTE: This sheet is a summary. It may not cover all possible information. If you have questions about this medicine, talk to your doctor, pharmacist, or health care provider.  2020 Elsevier/Gold Standard (2018-02-07 09:18:49)

## 2019-10-08 ENCOUNTER — Other Ambulatory Visit: Payer: Self-pay

## 2019-10-08 DIAGNOSIS — I714 Abdominal aortic aneurysm, without rupture, unspecified: Secondary | ICD-10-CM

## 2019-10-09 NOTE — Telephone Encounter (Signed)
Chart opened in error

## 2019-10-10 ENCOUNTER — Telehealth (HOSPITAL_COMMUNITY): Payer: Self-pay | Admitting: *Deleted

## 2019-10-10 NOTE — Telephone Encounter (Signed)
Left message on voicemail per DPR in reference to upcoming appointment scheduled on 10/16/19 at 8:15 with detailed instructions given per Myocardial Perfusion Study Information Sheet for the test. LM to arrive 15 minutes early, and that it is imperative to arrive on time for appointment to keep from having the test rescheduled. If you need to cancel or reschedule your appointment, please call the office within 24 hours of your appointment. Failure to do so may result in a cancellation of your appointment, and a $50 no show fee. Phone number given for call back for any questions.

## 2019-10-16 ENCOUNTER — Ambulatory Visit (INDEPENDENT_AMBULATORY_CARE_PROVIDER_SITE_OTHER): Payer: Medicare HMO

## 2019-10-16 ENCOUNTER — Other Ambulatory Visit: Payer: Self-pay

## 2019-10-16 DIAGNOSIS — R072 Precordial pain: Secondary | ICD-10-CM

## 2019-10-16 LAB — MYOCARDIAL PERFUSION IMAGING
LV dias vol: 143 mL (ref 62–150)
LV sys vol: 69 mL
Peak HR: 93 {beats}/min
Rest HR: 57 {beats}/min
SDS: 7
SRS: 3
SSS: 10
TID: 0.99

## 2019-10-16 MED ORDER — REGADENOSON 0.4 MG/5ML IV SOLN
0.4000 mg | Freq: Once | INTRAVENOUS | Status: AC
  Administered 2019-10-16: 0.4 mg via INTRAVENOUS

## 2019-10-16 MED ORDER — TECHNETIUM TC 99M TETROFOSMIN IV KIT
27.6000 | PACK | Freq: Once | INTRAVENOUS | Status: AC | PRN
  Administered 2019-10-16: 27.6 via INTRAVENOUS

## 2019-10-16 MED ORDER — TECHNETIUM TC 99M TETROFOSMIN IV KIT
11.0000 | PACK | Freq: Once | INTRAVENOUS | Status: AC | PRN
Start: 2019-10-16 — End: 2019-10-16
  Administered 2019-10-16: 11 via INTRAVENOUS

## 2019-10-17 ENCOUNTER — Telehealth: Payer: Self-pay | Admitting: *Deleted

## 2019-10-17 ENCOUNTER — Telehealth: Payer: Self-pay

## 2019-10-17 NOTE — Telephone Encounter (Signed)
Spoke with patient regarding results and recommendation.  Patient verbalizes understanding and is agreeable to plan of care. Advised patient to call back with any issues or concerns.   He is scheduled for 11/13/19 as this was the soonest he was available.

## 2019-10-17 NOTE — Telephone Encounter (Signed)
-----   Message from Thomasene Ripple, DO sent at 10/17/2019  8:19 AM EDT ----- Please have the patient see me sooner than October 7, like to discuss her stress test.

## 2019-10-17 NOTE — Telephone Encounter (Signed)
Opened in error

## 2019-10-17 NOTE — Telephone Encounter (Signed)
Pt called the triage line wanting to know about the plan for his bypass and requesting pain medicine. Explained that we were waiting on cardiac clearance and we were unable to prescribe him pain medicine at this time. Suggested that he call his cardiology office to try and get an early appointment as this was the limiting factor in getting his procedure done quickly. He will call back if he gets a cardiology appointment before his follow up appt with dr [name] on 10/24/19

## 2019-10-19 ENCOUNTER — Other Ambulatory Visit: Payer: Self-pay | Admitting: Cardiology

## 2019-10-19 DIAGNOSIS — E785 Hyperlipidemia, unspecified: Secondary | ICD-10-CM

## 2019-10-19 MED ORDER — REPATHA SURECLICK 140 MG/ML SUBCUTANEOUS PEN INJECTOR
SUBCUTANEOUS | 2 refills | 28.00000 days
Start: 2019-10-19 — End: 2019-11-18

## 2019-10-22 MED ORDER — REPATHA SURECLICK 140 MG/ML SUBCUTANEOUS PEN INJECTOR
SUBCUTANEOUS | 3 refills | 84.00000 days
Start: 2019-10-22 — End: ?

## 2019-10-22 MED ORDER — EVOLOCUMAB 140 MG/ML SUBCUTANEOUS PEN INJECTOR
SUBCUTANEOUS | 2 refills | 28 days
Start: 2019-10-22 — End: 2019-11-18

## 2019-10-24 ENCOUNTER — Encounter: Payer: Medicare HMO | Admitting: Vascular Surgery

## 2019-10-24 ENCOUNTER — Other Ambulatory Visit (HOSPITAL_COMMUNITY): Payer: Medicare HMO

## 2019-10-24 ENCOUNTER — Encounter (HOSPITAL_COMMUNITY): Payer: Medicare HMO

## 2019-10-25 NOTE — Unmapped (Signed)
[facility] Clinical Assessment & Refill Coordination Note    Glen Gross, [place]: 01/15/1966  Phone: [phone] (home)     All above HIPAA information was verified with patient.     Was a Nurse, learning disability used for this call? No    Specialty Medication(s):   General Specialty: Repatha     Current Outpatient Medications   Medication Sig Dispense Refill   ??? acetaminophen (TYLENOL) 500 MG tablet Take 2 tablets (1,000 mg total) by mouth every six (6) hours as needed.  0   ??? aspirin (ECOTRIN) 81 MG tablet Take 1 tablet (81 mg total) by mouth daily. 30 tablet 0   ??? aspirin 81 MG chewable tablet Chew 1 tablet (81 mg total) daily. 108 tablet 3   ??? atorvastatin (LIPITOR) 40 MG tablet Take 1 tablet (40 mg total) by mouth daily. 30 tablet 0   ??? atorvastatin (LIPITOR) 80 MG tablet Take 1 tablet (80 mg total) by mouth daily. 90 tablet 3   ??? carvedilol (COREG) 6.25 MG tablet Take 1 tablet (6.25 mg total) by mouth Two (2) times a day. 60 tablet 0   ??? clopidogreL (PLAVIX) 75 mg tablet Take 1 tablet (75 mg total) by mouth daily. 90 tablet 3   ??? empty container Misc Use as directed to dispose of injectable medications 1 each 3   ??? evolocumab (REPATHA SURECLICK) 140 mg/mL PnIj Inject the contents of 1 pen (140 mg) under the skin every fourteen (14) days. 6 mL 3   ??? gabapentin (NEURONTIN) 300 MG capsule Take 1 capsule (300 mg total) by mouth Three (3) times a day. (Patient taking differently: Take 600 mg by mouth two (2) times a day. ) 60 capsule 0   ??? gabapentin (NEURONTIN) 300 MG capsule Take 600 mg by mouth Three (3) times a day.      ??? lisinopriL (PRINIVIL,ZESTRIL) 10 MG tablet Take 10 mg by mouth daily.     ??? methylPREDNISolone (MEDROL DOSEPACK) 4 mg tablet follow package directions 1 Package 0   ??? metoprolol succinate (TOPROL XL) 25 MG 24 hr tablet Take 1/2 tablet (12.5 mg) by mouth daily. 45 tablet 1   ??? naproxen (NAPROSYN) 500 MG tablet Take 1 tablet (500 mg total) by mouth 2 (two) times a day with meals. As needed for pain 30 tablet 0   ??? nicotine polacrilex (NICORETTE MINI) lozenge 4 MG Apply 1 lozenge (4 mg total) to cheek every 1-2 hours as needed for smoking cessation. 72 lozenge 3   ??? nicotine polacrilex (NICORETTE) 4 MG gum Apply 1 each (4 mg total) to cheek every hour as needed for smoking cessation. Weeks 1-6: Chew 1 piece of gum every 1-2 hours as needed 110 each 3   ??? oxyCODONE (ROXICODONE) 10 mg immediate release tablet Take 10 mg by mouth 4 (four) times a day as needed.     ??? tiZANidine (ZANAFLEX) 4 MG tablet Instructions 1/2-1 tablet up to every 8 hours as needed for muscle spasm (Patient not taking: Reported on 07/31/2018) 20 tablet 0     No current facility-administered medications for this visit.        Changes to medications: [facility] reports no changes at this time.    No Known Allergies    Changes to allergies: No    SPECIALTY MEDICATION ADHERENCE     Repatha 140 mg/ml: 0 days of medicine on hand     Medication Adherence    Patient reported X missed doses  in the last month: 2  Specialty Medication: Repatha 140 mg/mL  Informant: patient          Specialty medication(s) dose(s) confirmed: Regimen is correct and unchanged.     Are there any concerns with adherence? No. Pt did miss last 2 doses because he did not have refills. He is not longer seeing the cardiologist in [facility] and his PCP did not feel comfortable refilling it. He has appointment with new cardiologist on 8/31 who sent new rx. [name] plans to restart ASAP.    Adherence counseling provided? Not needed    CLINICAL MANAGEMENT AND INTERVENTION      Clinical Benefit Assessment:    Do you feel the medicine is effective or helping your condition? Yes    Clinical Benefit counseling provided? Not needed    Adverse Effects Assessment:    Are you experiencing any side effects? No    Are you experiencing difficulty administering your medicine? No    Quality of Life Assessment:    How many days over the past month did your high cholesterol  keep you from your normal activities? For example, brushing your teeth or getting up in the morning. 0    Have you discussed this with your provider? Not needed    Therapy Appropriateness:    Is therapy appropriate? Yes, therapy is appropriate and should be continued    DISEASE/MEDICATION-SPECIFIC INFORMATION      For patients on injectable medications: Patient currently has 0 doses left.  Next injection is scheduled for ASAP.    PATIENT SPECIFIC NEEDS     - Does the patient have any physical, cognitive, or cultural barriers? No    - Is the patient high risk? No    - Does the patient require a Care Management Plan? No     - Does the patient require physician intervention or other additional services (i.e. nutrition, smoking cessation, social work)? No      SHIPPING     Specialty Medication(s) to be Shipped:   General Specialty: Repatha    Other medication(s) to be shipped: No additional medications requested for fill at this time     Changes to insurance: No    Delivery Scheduled: Yes, Expected medication delivery date: 10/30/19.     Medication will be delivered via UPS to the confirmed prescription address in [place].    The patient will receive a drug information handout for each medication shipped and additional FDA Medication Guides as required.  Verified that patient has previously received a Conservation officer, historic buildings.    All of the patient's questions and concerns have been addressed.    Glen Gross   [facility] Specialty Pharmacist

## 2019-10-29 MED FILL — REPATHA SURECLICK 140 MG/ML SUBCUTANEOUS PEN INJECTOR: 84 days supply | Qty: 6 | Fill #0 | Status: AC

## 2019-10-29 MED FILL — REPATHA SURECLICK 140 MG/ML SUBCUTANEOUS PEN INJECTOR: SUBCUTANEOUS | 84 days supply | Qty: 6 | Fill #0

## 2019-10-30 ENCOUNTER — Telehealth: Payer: Self-pay | Admitting: Cardiology

## 2019-10-30 ENCOUNTER — Other Ambulatory Visit: Payer: Self-pay | Admitting: *Deleted

## 2019-10-30 ENCOUNTER — Telehealth: Payer: Self-pay | Admitting: *Deleted

## 2019-10-30 NOTE — Telephone Encounter (Signed)
Patient is requesting to discuss having records sent over to his PCP. Please call.

## 2019-10-30 NOTE — Telephone Encounter (Signed)
Will send this message to [facility] Records dept to further review and assist the pt, in facilitating sending requested medical records from our office to his PCP's office.

## 2019-10-30 NOTE — Telephone Encounter (Signed)
Spoke with Pt after pt called our office multiple times requesting pain medication. We are unable to prescribe pain medication at this time. He is to be scheduled for fem-pop pending cardiac clearance. He has a cardiology appointment on 9/13. He also missed an appt with [place] on 8/25. Pt called EMS and EMS was with him when we spoke. Most hospitals are on diversion at the moment and the [facility] ED has long wait times. When I further enforced that we would not be prescribing him pain medication he hung up the phone.

## 2019-10-31 ENCOUNTER — Other Ambulatory Visit: Payer: Self-pay | Admitting: Vascular Surgery

## 2019-10-31 MED ORDER — OXYCODONE-ACETAMINOPHEN 5-325 MG PO TABS: 1.0000 | ORAL_TABLET | ORAL | 0 refills | Status: DC | PRN

## 2019-10-31 NOTE — Progress Notes (Signed)
This patient is having significant pain still.  I will will send in a prescription for 20 Percocet until he can be seen in the office.  [name], MD Office: [phone]

## 2019-11-08 DIAGNOSIS — M5136 Other intervertebral disc degeneration, lumbar region: Secondary | ICD-10-CM | POA: Insufficient documentation

## 2019-11-08 DIAGNOSIS — M51369 Other intervertebral disc degeneration, lumbar region without mention of lumbar back pain or lower extremity pain: Secondary | ICD-10-CM | POA: Insufficient documentation

## 2019-11-08 HISTORY — DX: Other intervertebral disc degeneration, lumbar region without mention of lumbar back pain or lower extremity pain: M51.369

## 2019-11-08 HISTORY — DX: Other intervertebral disc degeneration, lumbar region: M51.36

## 2019-11-13 ENCOUNTER — Other Ambulatory Visit: Payer: Self-pay

## 2019-11-13 ENCOUNTER — Ambulatory Visit (INDEPENDENT_AMBULATORY_CARE_PROVIDER_SITE_OTHER): Payer: Medicare HMO | Admitting: Cardiology

## 2019-11-13 ENCOUNTER — Encounter: Payer: Self-pay | Admitting: Cardiology

## 2019-11-13 VITALS — BP 128/72 | HR 74 | Ht 73.0 in | Wt 199.2 lb

## 2019-11-13 DIAGNOSIS — I10 Essential (primary) hypertension: Secondary | ICD-10-CM

## 2019-11-13 DIAGNOSIS — E782 Mixed hyperlipidemia: Secondary | ICD-10-CM

## 2019-11-13 DIAGNOSIS — I714 Abdominal aortic aneurysm, without rupture, unspecified: Secondary | ICD-10-CM

## 2019-11-13 DIAGNOSIS — I739 Peripheral vascular disease, unspecified: Secondary | ICD-10-CM

## 2019-11-13 DIAGNOSIS — R9439 Abnormal result of other cardiovascular function study: Secondary | ICD-10-CM

## 2019-11-13 DIAGNOSIS — F17200 Nicotine dependence, unspecified, uncomplicated: Secondary | ICD-10-CM

## 2019-11-13 DIAGNOSIS — Z951 Presence of aortocoronary bypass graft: Secondary | ICD-10-CM

## 2019-11-13 NOTE — Progress Notes (Signed)
Cardiology Office Note:    Date:  11/13/2019   ID:  Vittorio S Yeargan, DOB 01/13/1966, MRN [id number]  PCP:  Spencer, Sara C, PA-C  Cardiologist:  Goodwin Kamphaus, DO  Electrophysiologist:  None   Referring MD: No ref. provider found   " I am having shortness of breath"   History of Present Illness:    Benjamin Espinoza is a 54 y.o. male with a hx of coronary artery disease status post CABG in 2017 in [place], on April 24, 2019 the patient did have unstable angina was transferred to [facility] where he underwent a left heart catheterization showing all his grafts were patent, hypertension, hyperlipidemia, prediabetes, tobacco use and degenerative disc disease.   At his last visit on 10/05/2019 the patient was experiencing some intermittent chest pain and shortness of breath.  He did tell me at that time that his chest pain started after months after his LHC in [place]. Given he is high risk for progression of his CAD I therefore recommended a undergo a stress test.  At that time I also ordered Ranexa for the patient.  He did get the stress test in the meantime. He is here to discuss the result. He still have shortness of breath on exertion.  He is responding to the Ranexa as his chest pain has improved.   Past Medical History:  Diagnosis Date  . Atypical chest pain 05/07/2017  . Essential hypertension   . 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 Hyperlipidemia   . Myocardial infarction (HCC)   . PVD (peripheral vascular disease) (HCC) 09/21/2019  . Tobacco use disorder     Past Surgical History:  Procedure Laterality Date  . ABDOMINAL AORTOGRAM W/LOWER EXTREMITY Bilateral 09/21/2019   Procedure: ABDOMINAL AORTOGRAM W/LOWER EXTREMITY;  Surgeon: Dickson, Christopher S, MD;  Location: [facility];  Service: Cardiovascular;  Laterality: Bilateral;  . CORONARY ARTERY BYPASS GRAFT      Current Medications: Current Meds  Medication Sig  . aspirin EC 81 MG tablet Take 2 tablets  (162 mg total) by mouth daily.  . atorvastatin (LIPITOR) 80 MG tablet Take 80 mg by mouth daily.  . cilostazol (PLETAL) 100 MG tablet Take 100 mg by mouth 2 (two) times daily.  . gabapentin (NEURONTIN) 600 MG tablet Take 600 mg by mouth daily as needed (Pain).   . oxyCODONE (ROXICODONE) 5 MG immediate release tablet Take 1 tablet (5 mg total) by mouth every 4 (four) hours as needed for severe pain.  . oxyCODONE-acetaminophen (PERCOCET) 5-325 MG tablet Take 1 tablet by mouth every 4 (four) hours as needed for severe pain.  . oxyCODONE-acetaminophen (PERCOCET) 5-325 MG tablet Take 1 tablet by mouth every 4 (four) hours as needed for severe pain.  . ranolazine (RANEXA) 500 MG 12 hr tablet Take 1 tablet (500 mg total) by mouth 2 (two) times daily.  . REPATHA SURECLICK 140 MG/ML SOAJ Inject the contents of 1 pen (140 mg) under the skin every fourteen (14) days.     Allergies:   Patient has no known allergies.   Social History   Socioeconomic History  . Marital status: Married    Spouse name: Not on file  . Number of children: Not on file  . Years of education: Not on file  . Highest education level: Not on file  Occupational History  . Not on file  Tobacco Use  . Smoking status: Current Every Day Smoker    Types: Cigars  . Smokeless tobacco:   Never Used  Vaping Use  . Vaping Use: Never used  Substance and Sexual Activity  . Alcohol use: No  . Drug use: No  . Sexual activity: Not on file  Other Topics Concern  . Not on file  Social History Narrative  . Not on file   Social Determinants of Health   Financial Resource Strain:   . Difficulty of Paying Living Expenses: Not on file  Food Insecurity:   . Worried About Running Out of Food in the Last Year: Not on file  . Ran Out of Food in the Last Year: Not on file  Transportation Needs:   . Lack of Transportation (Medical): Not on file  . Lack of Transportation (Non-Medical): Not on file  Physical Activity:   . Days of Exercise  per Week: Not on file  . Minutes of Exercise per Session: Not on file  Stress:   . Feeling of Stress : Not on file  Social Connections:   . Frequency of Communication with Friends and Family: Not on file  . Frequency of Social Gatherings with Friends and Family: Not on file  . Attends Religious Services: Not on file  . Active Member of Clubs or Organizations: Not on file  . Attends Club or Organization Meetings: Not on file  . Marital Status: Not on file     Family History: The patient's family history is not on file.  ROS:   Review of Systems  Constitution: Negative for decreased appetite, fever and weight gain.  HENT: Negative for congestion, ear discharge, hoarse voice and sore throat.   Eyes: Negative for discharge, redness, vision loss in right eye and visual halos.  Cardiovascular: Negative for chest pain, dyspnea on exertion, leg swelling, orthopnea and palpitations.  Respiratory: Negative for cough, hemoptysis, shortness of breath and snoring.   Endocrine: Negative for heat intolerance and polyphagia.  Hematologic/Lymphatic: Negative for bleeding problem. Does not bruise/bleed easily.  Skin: Negative for flushing, nail changes, rash and suspicious lesions.  Musculoskeletal: Negative for arthritis, joint pain, muscle cramps, myalgias, neck pain and stiffness.  Gastrointestinal: Negative for abdominal pain, bowel incontinence, diarrhea and excessive appetite.  Genitourinary: Negative for decreased libido, genital sores and incomplete emptying.  Neurological: Negative for brief paralysis, focal weakness, headaches and loss of balance.  Psychiatric/Behavioral: Negative for altered mental status, depression and suicidal ideas.  Allergic/Immunologic: Negative for HIV exposure and persistent infections.    EKGs/Labs/Other Studies Reviewed:    The following studies were reviewed today:   EKG:  The ekg ordered today demonstrates   Recent Labs: 09/04/2019: Platelets  273 09/21/2019: BUN 14; Creatinine, Ser 1.30; Hemoglobin 16.3; Potassium 4.5; Sodium 143  Recent Lipid Panel    Component Value Date/Time   CHOL 191 05/07/2017 1555   TRIG 61 05/07/2017 1555   HDL 27 (L) 05/07/2017 1555   CHOLHDL 7.1 05/07/2017 1555   VLDL 12 05/07/2017 1555   LDLCALC 152 (H) 05/07/2017 1555    Physical Exam:    VS:  BP 128/72   Pulse 74   Ht 6' 1" (1.854 m)   Wt 199 lb 3.2 oz (90.4 kg)   SpO2 98%   BMI 26.28 kg/m     Wt Readings from Last 3 Encounters:  11/13/19 199 lb 3.2 oz (90.4 kg)  10/16/19 197 lb (89.4 kg)  10/05/19 197 lb (89.4 kg)     GEN: Well nourished, well developed in no acute distress HEENT: Normal NECK: No JVD; No carotid bruits LYMPHATICS: No lymphadenopathy CARDIAC:   S1S2 noted,RRR, no murmurs, rubs, gallops RESPIRATORY:  Clear to auscultation without rales, wheezing or rhonchi  ABDOMEN: Soft, non-tender, non-distended, +bowel sounds, no guarding. EXTREMITIES: No edema, No cyanosis, no clubbing MUSCULOSKELETAL:  No deformity  SKIN: Warm and dry NEUROLOGIC:  Alert and oriented x 3, non-focal PSYCHIATRIC:  Normal affect, good insight  ASSESSMENT:    1. Abnormal stress test   2. PVD (peripheral vascular disease) (HCC)   3. Essential hypertension   4. Mixed hyperlipidemia   5. Hx of CABG   6. Tobacco use disorder    PLAN:     I did review with the patient nuclear stress test recently I was able to appreciate a small area of reversible defect suggesting anterior ischemia.  Given his risk factors and high progression for coronary artery disease I like to pursue a left heart catheterization in this patient to make sure that his grafts are still patent.  The patient understands that risks include but are not limited to stroke (1 in 1000), death (1 in 1000), kidney failure [usually temporary] (1 in 500), bleeding (1 in 200), allergic reaction [possibly serious] (1 in 200), and agrees to proceed.  He continues to smoke I did educate him  smoking cessation how this affects his coronary artery disease as well as his peripheral vascular disease.     His blood pressure is thankfully up.  Probably in the office today.  He will maintain taking his aspirin 81 mg daily, atorvastatin 80 mg daily as well as Ranexa.  No changes in antihypertensive regimen. The patient is in agreement with the above plan. The patient left the office in stable condition.  The patient will follow up in 2 weeks  After his left heart cath.  Medication Adjustments/Labs and Tests Ordered: Current medicines are reviewed at length with the patient today.  Concerns regarding medicines are outlined above.  Orders Placed This Encounter  Procedures  . Basic metabolic panel  . CBC  . EKG 12-Lead   No orders of the defined types were placed in this encounter.   Patient Instructions  Medication Instructions:  Your physician recommends that you continue on your current medications as directed. Please refer to the Current Medication list given to you today.  *If you need a refill on your cardiac medications before your next appointment, please call your pharmacy*   Lab Work: Your physician recommends that you return for lab work today: bmp cbc If you have labs (blood work) drawn today and your tests are completely normal, you will receive your results only by: . MyChart Message (if you have MyChart) OR . A paper copy in the mail If you have any lab test that is abnormal or we need to change your treatment, we will call you to review the results.   Testing/Procedures:    [facility] [address] [place] [address] Dept: [phone] Loc: [phone]  Kethan S Ammar  11/13/2019  You are scheduled for a Cardiac Catheterization on Monday, September 20 with Dr. Christopher McAlhany.  1. Please arrive at the [facility] (Main Entrance A) at [facility]: [address] [place], [place] [address] at 7:00 AM (This time is two hours before your procedure to ensure your preparation). Free valet parking service is available.   Special note: Every effort is made to have your procedure done on time. Please understand that emergencies sometimes delay scheduled procedures.  2. Diet: Do not   eat solid foods after midnight.  The patient may have clear liquids until 5am upon the day of the procedure.  3. Labs: You will have labs drawn today.  4. Medication instructions in preparation for your procedure:   Contrast Allergy: No    On the morning of your procedure, take your Aspirin and any morning medicines NOT listed above.  You may use sips of water.  5. Plan for one night stay--bring personal belongings. 6. Bring a current list of your medications and current insurance cards. 7. You MUST have a responsible person to drive you home. 8. Someone MUST be with you the first 24 hours after you arrive home or your discharge will be delayed. 9. Please wear clothes that are easy to get on and off and wear slip-on shoes.  Thank you for allowing us to care for you!   -- [facility]    Follow-Up: At [facility], you and your health needs are our priority.  As part of our continuing mission to provide you with exceptional heart care, we have created designated Provider Care Teams.  These Care Teams include your primary Cardiologist (physician) and Advanced Practice Providers (APPs -  Physician Assistants and Nurse Practitioners) who all work together to provide you with the care you need, when you need it.  We recommend signing up for the patient portal called "MyChart".  Sign up information is provided on this After Visit Summary.  MyChart is used to connect with patients for Virtual Visits (Telemedicine).  Patients are able to view lab/test results, encounter notes, upcoming appointments, etc.  Non-urgent messages can be sent to  your provider as well.   To learn more about what you can do with MyChart, go to [email or web address].    Your next appointment:   2 week(s)  The format for your next appointment:   In Person  Provider:   Elery Cadenhead, DO   Other Instructions   Coronary Angiogram With Stent Coronary angiogram with stent placement is a procedure to widen or open a narrow blood vessel of the heart (coronary artery). Arteries may become blocked by cholesterol buildup (plaques) in the lining of the artery wall. When a coronary artery becomes partially blocked, blood flow to that area decreases. This may lead to chest pain or a heart attack (myocardial infarction). A stent is a small piece of metal that looks like mesh or spring. Stent placement may be done as treatment after a heart attack, or to prevent a heart attack if a blocked artery is found by a coronary angiogram. Let your health care provider know about:  Any allergies you have, including allergies to medicines or contrast dye.  All medicines you are taking, including vitamins, herbs, eye drops, creams, and over-the-counter medicines.  Any problems you or family members have had with anesthetic medicines.  Any blood disorders you have.  Any surgeries you have had.  Any medical conditions you have, including kidney problems or kidney failure.  Whether you are pregnant or may be pregnant.  Whether you are breastfeeding. What are the risks? Generally, this is a safe procedure. However, serious problems may occur, including:  Damage to nearby structures or organs, such as the heart, blood vessels, or kidneys.  A return of blockage.  Bleeding, infection, or bruising at the insertion site.  A collection of blood under the skin (hematoma) at the insertion site.  A blood clot in another part of the body.  Allergic reaction to medicines or   dyes.  Bleeding into the abdomen (retroperitoneal bleeding).  Stroke (rare).  Heart attack  (rare). What happens before the procedure? Staying hydrated Follow instructions from your health care provider about hydration, which may include:  Up to 2 hours before the procedure - you may continue to drink clear liquids, such as water, clear fruit juice, black coffee, and plain tea.  Eating and drinking restrictions Follow instructions from your health care provider about eating and drinking, which may include:  8 hours before the procedure - stop eating heavy meals or foods, such as meat, fried foods, or fatty foods.  6 hours before the procedure - stop eating light meals or foods, such as toast or cereal.  2 hours before the procedure - stop drinking clear liquids. Medicines Ask your health care provider about:  Changing or stopping your regular medicines. This is especially important if you are taking diabetes medicines or blood thinners.  Taking medicines such as aspirin and ibuprofen. These medicines can thin your blood. Do not take these medicines unless your health care provider tells you to take them. ? Generally, aspirin is recommended before a thin tube, called a catheter, is passed through a blood vessel and inserted into the heart (cardiac catheterization).  Taking over-the-counter medicines, vitamins, herbs, and supplements. General instructions  Do not use any products that contain nicotine or tobacco for at least 4 weeks before the procedure. These products include cigarettes, e-cigarettes, and chewing tobacco. If you need help quitting, ask your health care provider.  Plan to have someone take you home from the hospital or clinic.  If you will be going home right after the procedure, plan to have someone with you for 24 hours.  You may have tests and imaging procedures.  Ask your health care provider: ? How your insertion site will be marked. Ask which artery will be used for the procedure. ? What steps will be taken to help prevent infection. These may  include:  Removing hair at the insertion site.  Washing skin with a germ-killing soap.  Taking antibiotic medicine. What happens during the procedure?   An IV will be inserted into one of your veins.  Electrodes may be placed on your chest to monitor your heart rate during the procedure.  You will be given one or more of the following: ? A medicine to help you relax (sedative). ? A medicine to numb the area (local anesthetic) for catheter insertion.  A small incision will be made for catheter insertion.  The catheter will be inserted into an artery using a guide wire. The location may be in your groin, your wrist, or the fold of your arm (near your elbow).  An X-ray procedure (fluoroscopy) will be used to help guide the catheter to the opening of the heart arteries.  A dye will be injected into the catheter. X-rays will be taken. The dye helps to show where any narrowing or blockages are located in the arteries.  Tell your health care provider if you have chest pain or trouble breathing.  A tiny wire will be guided to the blocked spot, and a balloon will be inflated to make the artery wider.  The stent will be expanded to crush the plaques into the wall of the vessel. The stent will hold the area open and improve the blood flow. Most stents have a drug coating to reduce the risk of the stent narrowing over time.  The artery may be made wider using a drill, laser, or   other tools that remove plaques.  The catheter will be removed when the blood flow improves. The stent will stay where it was placed, and the lining of the artery will grow over it.  A bandage (dressing) will be placed on the insertion site. Pressure will be applied to stop bleeding.  The IV will be removed. This procedure may vary among health care providers and hospitals. What happens after the procedure?  Your blood pressure, heart rate, breathing rate, and blood oxygen level will be monitored until you leave  the hospital or clinic.  If the procedure is done through the leg, you will lie flat in bed for a few hours or for as long as told by your health care provider. You will be instructed not to bend or cross your legs.  The insertion site and the pulse in your foot or wrist will be checked often.  You may have more blood tests, X-rays, and a test that records the electrical activity of your heart (electrocardiogram, or ECG).  Do not drive for 24 hours if you were given a sedative during your procedure. Summary  Coronary angiogram with stent placement is a procedure to widen or open a narrowed coronary artery. This is done to treat heart problems.  Before the procedure, let your health care provider know about all the medical conditions and surgeries you have or have had.  This is a safe procedure. However, some problems may occur, including damage to nearby structures or organs, bleeding, blood clots, or allergies.  Follow your health care provider's instructions about eating, drinking, medicines, and other lifestyle changes, such as quitting tobacco use before the procedure. This information is not intended to replace advice given to you by your health care provider. Make sure you discuss any questions you have with your health care provider. Document Revised: 09/06/2018 Document Reviewed: 09/06/2018 Elsevier Patient Education  2020 [employer] Inc.      Adopting a Healthy Lifestyle.  Know what a healthy weight is for you (roughly BMI <25) and aim to maintain this   Aim for 7+ servings of fruits and vegetables daily   65-80+ fluid ounces of water or unsweet tea for healthy kidneys   Limit to max 1 drink of alcohol per day; avoid smoking/tobacco   Limit animal fats in diet for cholesterol and heart health - choose grass fed whenever available   Avoid highly processed foods, and foods high in saturated/trans fats   Aim for low stress - take time to unwind and care for your mental  health   Aim for 150 min of moderate intensity exercise weekly for heart health, and weights twice weekly for bone health   Aim for 7-9 hours of sleep daily   When it comes to diets, agreement about the perfect plan isnt easy to find, even among the experts. Experts at the [employer] developed an idea known as the Healthy Eating Plate. Just imagine a plate divided into logical, healthy portions.   The emphasis is on diet quality:   Load up on vegetables and fruits - one-half of your plate: Aim for color and variety, and remember that potatoes dont count.   Go for whole grains - one-quarter of your plate: Whole wheat, barley, wheat berries, quinoa, oats, brown rice, and foods made with them. If you want pasta, go with whole wheat pasta.   Protein power - one-quarter of your plate: Fish, chicken, beans, and nuts are all healthy, versatile protein sources. Limit   red meat.   The diet, however, does go beyond the plate, offering a few other suggestions.   Use healthy plant oils, such as olive, canola, soy, corn, sunflower and peanut. Check the labels, and avoid partially hydrogenated oil, which have unhealthy trans fats.   If youre thirsty, drink water. Coffee and tea are good in moderation, but skip sugary drinks and limit milk and dairy products to one or two daily servings.   The type of carbohydrate in the diet is more important than the amount. Some sources of carbohydrates, such as vegetables, fruits, whole grains, and beans-are healthier than others.   Finally, stay active  Signed, Chapel Silverthorn, DO  11/13/2019 2:21 PM    [facility] 

## 2019-11-13 NOTE — Patient Instructions (Signed)
Medication Instructions:  Your physician recommends that you continue on your current medications as directed. Please refer to the Current Medication list given to you today.  *If you need a refill on your cardiac medications before your next appointment, please call your pharmacy*   Lab Work: Your physician recommends that you return for lab work today: bmp cbc If you have labs (blood work) drawn today and your tests are completely normal, you will receive your results only by: Marland Kitchen MyChart Message (if you have MyChart) OR . A paper copy in the mail If you have any lab test that is abnormal or we need to change your treatment, we will call you to review the results.   Testing/Procedures:    [facility] [place] [address] [place] [address] Dept: [phone] Loc: [phone]  Benjamin Espinoza  11/13/2019  You are scheduled for a Cardiac Catheterization on Monday, September 20 with Dr. Verne Carrow.  1. Please arrive at the [facility] (Main Entrance A) at [facility]: [address] [place], [place] [address] at 7:00 AM (This time is two hours before your procedure to ensure your preparation). Free valet parking service is available.   Special note: Every effort is made to have your procedure done on time. Please understand that emergencies sometimes delay scheduled procedures.  2. Diet: Do not eat solid foods after midnight.  The patient may have clear liquids until 5am upon the day of the procedure.  3. Labs: You will have labs drawn today.  4. Medication instructions in preparation for your procedure:   Contrast Allergy: No    On the morning of your procedure, take your Aspirin and any morning medicines NOT listed above.  You may use sips of water.  5. Plan for one night stay--bring personal belongings. 6. Bring a current list of your medications and current insurance  cards. 7. You MUST have a responsible person to drive you home. 8. Someone MUST be with you the first 24 hours after you arrive home or your discharge will be delayed. 9. Please wear clothes that are easy to get on and off and wear slip-on shoes.  Thank you for allowing [place] to care for you!   -- [facility]    Follow-Up: At [facility], you and your health needs are our priority.  As part of our continuing mission to provide you with exceptional heart care, we have created designated Provider Care Teams.  These Care Teams include your primary Cardiologist (physician) and Advanced Practice Providers (APPs -  Physician Assistants and Nurse Practitioners) who all work together to provide you with the care you need, when you need it.  We recommend signing up for the patient portal called "MyChart".  Sign up information is provided on this After Visit Summary.  MyChart is used to connect with patients for Virtual Visits (Telemedicine).  Patients are able to view lab/test results, encounter notes, upcoming appointments, etc.  Non-urgent messages can be sent to your provider as well.   To learn more about what you can do with MyChart, go to [email or web address].    Your next appointment:   2 week(s)  The format for your next appointment:   In Person  Provider:   Thomasene Ripple, DO   Other Instructions   Coronary Angiogram With Stent Coronary angiogram with stent placement is a procedure to widen or open a narrow blood vessel of the heart (  coronary artery). Arteries may become blocked by cholesterol buildup (plaques) in the lining of the artery wall. When a coronary artery becomes partially blocked, blood flow to that area decreases. This may lead to chest pain or a heart attack (myocardial infarction). A stent is a small piece of metal that looks like mesh or spring. Stent placement may be done as treatment after a heart attack, or to prevent a heart attack  if a blocked artery is found by a coronary angiogram. Let your health care provider know about:  Any allergies you have, including allergies to medicines or contrast dye.  All medicines you are taking, including vitamins, herbs, eye drops, creams, and over-the-counter medicines.  Any problems you or family members have had with anesthetic medicines.  Any blood disorders you have.  Any surgeries you have had.  Any medical conditions you have, including kidney problems or kidney failure.  Whether you are pregnant or may be pregnant.  Whether you are breastfeeding. What are the risks? Generally, this is a safe procedure. However, serious problems may occur, including:  Damage to nearby structures or organs, such as the heart, blood vessels, or kidneys.  A return of blockage.  Bleeding, infection, or bruising at the insertion site.  A collection of blood under the skin (hematoma) at the insertion site.  A blood clot in another part of the body.  Allergic reaction to medicines or dyes.  Bleeding into the abdomen (retroperitoneal bleeding).  Stroke (rare).  Heart attack (rare). What happens before the procedure? Staying hydrated Follow instructions from your health care provider about hydration, which may include:  Up to 2 hours before the procedure - you may continue to drink clear liquids, such as water, clear fruit juice, black coffee, and plain tea.  Eating and drinking restrictions Follow instructions from your health care provider about eating and drinking, which may include:  8 hours before the procedure - stop eating heavy meals or foods, such as meat, fried foods, or fatty foods.  6 hours before the procedure - stop eating light meals or foods, such as toast or cereal.  2 hours before the procedure - stop drinking clear liquids. Medicines Ask your health care provider about:  Changing or stopping your regular medicines. This is especially important if you are  taking diabetes medicines or blood thinners.  Taking medicines such as aspirin and ibuprofen. These medicines can thin your blood. Do not take these medicines unless your health care provider tells you to take them. ? Generally, aspirin is recommended before a thin tube, called a catheter, is passed through a blood vessel and inserted into the heart (cardiac catheterization).  Taking over-the-counter medicines, vitamins, herbs, and supplements. General instructions  Do not use any products that contain nicotine or tobacco for at least 4 weeks before the procedure. These products include cigarettes, e-cigarettes, and chewing tobacco. If you need help quitting, ask your health care provider.  Plan to have someone take you home from the hospital or clinic.  If you will be going home right after the procedure, plan to have someone with you for 24 hours.  You may have tests and imaging procedures.  Ask your health care provider: ? How your insertion site will be marked. Ask which artery will be used for the procedure. ? What steps will be taken to help prevent infection. These may include:  Removing hair at the insertion site.  Washing skin with a germ-killing soap.  Taking antibiotic medicine. What happens during the  procedure?   An IV will be inserted into one of your veins.  Electrodes may be placed on your chest to monitor your heart rate during the procedure.  You will be given one or more of the following: ? A medicine to help you relax (sedative). ? A medicine to numb the area (local anesthetic) for catheter insertion.  A small incision will be made for catheter insertion.  The catheter will be inserted into an artery using a guide wire. The location may be in your groin, your wrist, or the fold of your arm (near your elbow).  An X-ray procedure (fluoroscopy) will be used to help guide the catheter to the opening of the heart arteries.  A dye will be injected into the  catheter. X-rays will be taken. The dye helps to show where any narrowing or blockages are located in the arteries.  Tell your health care provider if you have chest pain or trouble breathing.  A tiny wire will be guided to the blocked spot, and a balloon will be inflated to make the artery wider.  The stent will be expanded to crush the plaques into the wall of the vessel. The stent will hold the area open and improve the blood flow. Most stents have a drug coating to reduce the risk of the stent narrowing over time.  The artery may be made wider using a drill, laser, or other tools that remove plaques.  The catheter will be removed when the blood flow improves. The stent will stay where it was placed, and the lining of the artery will grow over it.  A bandage (dressing) will be placed on the insertion site. Pressure will be applied to stop bleeding.  The IV will be removed. This procedure may vary among health care providers and hospitals. What happens after the procedure?  Your blood pressure, heart rate, breathing rate, and blood oxygen level will be monitored until you leave the hospital or clinic.  If the procedure is done through the leg, you will lie flat in bed for a few hours or for as long as told by your health care provider. You will be instructed not to bend or cross your legs.  The insertion site and the pulse in your foot or wrist will be checked often.  You may have more blood tests, X-rays, and a test that records the electrical activity of your heart (electrocardiogram, or ECG).  Do not drive for 24 hours if you were given a sedative during your procedure. Summary  Coronary angiogram with stent placement is a procedure to widen or open a narrowed coronary artery. This is done to treat heart problems.  Before the procedure, let your health care provider know about all the medical conditions and surgeries you have or have had.  This is a safe procedure. However, some  problems may occur, including damage to nearby structures or organs, bleeding, blood clots, or allergies.  Follow your health care provider's instructions about eating, drinking, medicines, and other lifestyle changes, such as quitting tobacco use before the procedure. This information is not intended to replace advice given to you by your health care provider. Make sure you discuss any questions you have with your health care provider. Document Revised: 09/06/2018 Document Reviewed: 09/06/2018 Elsevier Patient Education  2020 [employer].

## 2019-11-13 NOTE — H&P (View-Only) (Signed)
Cardiology Office Note:    Date:  11/13/2019   ID:  Benjamin Espinoza, DOB [birth year], MRN [id number]  PCP:  Teena Irani, PA-C  Cardiologist:  Thomasene Ripple, DO  Electrophysiologist:  None   Referring MD: No ref. provider found   " I am having shortness of breath"   History of Present Illness:    Benjamin Espinoza is a 54 y.o. male with a hx of coronary artery disease status post CABG in 2017 in [place], on April 24, 2019 the patient did have unstable angina was transferred to [facility] where he underwent a left heart catheterization showing all his grafts were patent, hypertension, hyperlipidemia, prediabetes, tobacco use and degenerative disc disease.   At his last visit on 10/05/2019 the patient was experiencing some intermittent chest pain and shortness of breath.  He did tell me at that time that his chest pain started after months after his LHC [place]. Given he is high risk for progression of his CAD I therefore recommended a undergo a stress test.  At that time I also ordered Ranexa for the patient.  He did get the stress test in the meantime. He is here to discuss the result. He still have shortness of breath on exertion.  He is responding to the Ranexa as his chest pain has improved.   Past Medical History:  Diagnosis Date  . Atypical chest pain 05/07/2017  . Essential hypertension   . 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 Hyperlipidemia   . Myocardial infarction (HCC)   . PVD (peripheral vascular disease) (HCC) 09/21/2019  . Tobacco use disorder     Past Surgical History:  Procedure Laterality Date  . ABDOMINAL AORTOGRAM W/LOWER EXTREMITY Bilateral 09/21/2019   Procedure: ABDOMINAL AORTOGRAM W/LOWER EXTREMITY;  Surgeon: [name], MD;  Location: [facility] INVASIVE CV LAB;  Service: Cardiovascular;  Laterality: Bilateral;  . CORONARY ARTERY BYPASS GRAFT      Current Medications: Current Meds  Medication Sig  . aspirin EC 81 MG tablet Take 2 tablets  (162 mg total) by mouth daily.  Marland Kitchen atorvastatin (LIPITOR) 80 MG tablet Take 80 mg by mouth daily.  . cilostazol (PLETAL) 100 MG tablet Take 100 mg by mouth 2 (two) times daily.  Marland Kitchen gabapentin (NEURONTIN) 600 MG tablet Take 600 mg by mouth daily as needed (Pain).   Marland Kitchen oxyCODONE (ROXICODONE) 5 MG immediate release tablet Take 1 tablet (5 mg total) by mouth every 4 (four) hours as needed for severe pain.  Marland Kitchen oxyCODONE-acetaminophen (PERCOCET) 5-325 MG tablet Take 1 tablet by mouth every 4 (four) hours as needed for severe pain.  Marland Kitchen oxyCODONE-acetaminophen (PERCOCET) 5-325 MG tablet Take 1 tablet by mouth every 4 (four) hours as needed for severe pain.  . ranolazine (RANEXA) 500 MG 12 hr tablet Take 1 tablet (500 mg total) by mouth 2 (two) times daily.  Marland Kitchen REPATHA SURECLICK 140 MG/ML SOAJ Inject the contents of 1 pen (140 mg) under the skin every fourteen (14) days.     Allergies:   Patient has no known allergies.   Social History   Socioeconomic History  . Marital status: Married    Spouse name: Not on file  . Number of children: Not on file  . Years of education: Not on file  . Highest education level: Not on file  Occupational History  . Not on file  Tobacco Use  . Smoking status: Current Every Day Smoker    Types: Cigars  . Smokeless tobacco:  Never Used  Vaping Use  . Vaping Use: Never used  Substance and Sexual Activity  . Alcohol use: No  . Drug use: No  . Sexual activity: Not on file  Other Topics Concern  . Not on file  Social History Narrative  . Not on file   Social Determinants of Health   Financial Resource Strain:   . Difficulty of Paying Living Expenses: Not on file  Food Insecurity:   . Worried About Programme researcher, broadcasting/film/video in the Last Year: Not on file  . Ran Out of Food in the Last Year: Not on file  Transportation Needs:   . Lack of Transportation (Medical): Not on file  . Lack of Transportation (Non-Medical): Not on file  Physical Activity:   . Days of Exercise  per Week: Not on file  . Minutes of Exercise per Session: Not on file  Stress:   . Feeling of Stress : Not on file  Social Connections:   . Frequency of Communication with Friends and Family: Not on file  . Frequency of Social Gatherings with Friends and Family: Not on file  . Attends Religious Services: Not on file  . Active Member of Clubs or Organizations: Not on file  . Attends Banker Meetings: Not on file  . Marital Status: Not on file     Family History: The patient's family history is not on file.  ROS:   Review of Systems  Constitution: Negative for decreased appetite, fever and weight gain.  HENT: Negative for congestion, ear discharge, hoarse voice and sore throat.   Eyes: Negative for discharge, redness, vision loss in right eye and visual halos.  Cardiovascular: Negative for chest pain, dyspnea on exertion, leg swelling, orthopnea and palpitations.  Respiratory: Negative for cough, hemoptysis, shortness of breath and snoring.   Endocrine: Negative for heat intolerance and polyphagia.  Hematologic/Lymphatic: Negative for bleeding problem. Does not bruise/bleed easily.  Skin: Negative for flushing, nail changes, rash and suspicious lesions.  Musculoskeletal: Negative for arthritis, joint pain, muscle cramps, myalgias, neck pain and stiffness.  Gastrointestinal: Negative for abdominal pain, bowel incontinence, diarrhea and excessive appetite.  Genitourinary: Negative for decreased libido, genital sores and incomplete emptying.  Neurological: Negative for brief paralysis, focal weakness, headaches and loss of balance.  Psychiatric/Behavioral: Negative for altered mental status, depression and suicidal ideas.  Allergic/Immunologic: Negative for HIV exposure and persistent infections.    EKGs/Labs/Other Studies Reviewed:    The following studies were reviewed today:   EKG:  The ekg ordered today demonstrates   Recent Labs: 09/04/2019: Platelets  273 09/21/2019: BUN 14; Creatinine, Ser 1.30; Hemoglobin 16.3; Potassium 4.5; Sodium 143  Recent Lipid Panel    Component Value Date/Time   CHOL 191 05/07/2017 1555   TRIG 61 05/07/2017 1555   HDL 27 (L) 05/07/2017 1555   CHOLHDL 7.1 05/07/2017 1555   VLDL 12 05/07/2017 1555   LDLCALC 152 (H) 05/07/2017 1555    Physical Exam:    VS:  BP 128/72   Pulse 74   Ht 6\' 1"  (1.854 m)   Wt 199 lb 3.2 oz (90.4 kg)   SpO2 98%   BMI 26.28 kg/m     Wt Readings from Last 3 Encounters:  11/13/19 199 lb 3.2 oz (90.4 kg)  10/16/19 197 lb (89.4 kg)  10/05/19 197 lb (89.4 kg)     GEN: Well nourished, well developed in no acute distress HEENT: Normal NECK: No JVD; No carotid bruits LYMPHATICS: No lymphadenopathy CARDIAC:  S1S2 noted,RRR, no murmurs, rubs, gallops RESPIRATORY:  Clear to auscultation without rales, wheezing or rhonchi  ABDOMEN: Soft, non-tender, non-distended, +bowel sounds, no guarding. EXTREMITIES: No edema, No cyanosis, no clubbing MUSCULOSKELETAL:  No deformity  SKIN: Warm and dry NEUROLOGIC:  Alert and oriented x 3, non-focal PSYCHIATRIC:  Normal affect, good insight  ASSESSMENT:    1. Abnormal stress test   2. PVD (peripheral vascular disease) (HCC)   3. Essential hypertension   4. Mixed hyperlipidemia   5. Hx of CABG   6. Tobacco use disorder    PLAN:     I did review with the patient nuclear stress test recently I was able to appreciate a small area of reversible defect suggesting anterior ischemia.  Given his risk factors and high progression for coronary artery disease I like to pursue a left heart catheterization in this patient to make sure that his grafts are still patent.  The patient understands that risks include but are not limited to stroke (1 in 1000), death (1 in 1000), kidney failure [usually temporary] (1 in 500), bleeding (1 in 200), allergic reaction [possibly serious] (1 in 200), and agrees to proceed.  He continues to smoke I did educate him  smoking cessation how this affects his coronary artery disease as well as his peripheral vascular disease.     His blood pressure is thankfully up.  Probably in the office today.  He will maintain taking his aspirin 81 mg daily, atorvastatin 80 mg daily as well as Ranexa.  No changes in antihypertensive regimen. The patient is in agreement with the above plan. The patient left the office in stable condition.  The patient will follow up in 2 weeks  After his left heart cath.  Medication Adjustments/Labs and Tests Ordered: Current medicines are reviewed at length with the patient today.  Concerns regarding medicines are outlined above.  Orders Placed This Encounter  Procedures  . Basic metabolic panel  . CBC  . EKG 12-Lead   No orders of the defined types were placed in this encounter.   Patient Instructions  Medication Instructions:  Your physician recommends that you continue on your current medications as directed. Please refer to the Current Medication list given to you today.  *If you need a refill on your cardiac medications before your next appointment, please call your pharmacy*   Lab Work: Your physician recommends that you return for lab work today: bmp cbc If you have labs (blood work) drawn today and your tests are completely normal, you will receive your results only by: Marland Kitchen MyChart Message (if you have MyChart) OR . A paper copy in the mail If you have any lab test that is abnormal or we need to change your treatment, we will call you to review the results.   Testing/Procedures:    [facility] CARDIOVASCULAR DIVISION [facility] [place] [address] [place] [address] Dept: [phone] Loc: [phone]  LEDGER HEINDL  11/13/2019  You are scheduled for a Cardiac Catheterization on Monday, September 20 with Dr. Verne Carrow.  1. Please arrive at the [facility]Main Entrance [facility] [address] [place], [place] [address] at 7:00 AM (This time is two hours before your procedure to ensure your preparation). Free valet parking service is available.   Special note: Every effort is made to have your procedure done on time. Please understand that emergencies sometimes delay scheduled procedures.  2. Diet: Do not  eat solid foods after midnight.  The patient may have clear liquids until 5am upon the day of the procedure.  3. Labs: You will have labs drawn today.  4. Medication instructions in preparation for your procedure:   Contrast Allergy: No    On the morning of your procedure, take your Aspirin and any morning medicines NOT listed above.  You may use sips of water.  5. Plan for one night stay--bring personal belongings. 6. Bring a current list of your medications and current insurance cards. 7. You MUST have a responsible person to drive you home. 8. Someone MUST be with you the first 24 hours after you arrive home or your discharge will be delayed. 9. Please wear clothes that are easy to get on and off and wear slip-on shoes.  Thank you for allowing [place] to care for you!   -- [place] [facility]    Follow-Up: At [facility], you and your health needs are our priority.  As part of our continuing mission to provide you with exceptional heart care, we have created designated Provider Care Teams.  These Care Teams include your primary Cardiologist (physician) and Advanced Practice Providers (APPs -  Physician Assistants and Nurse Practitioners) who all work together to provide you with the care you need, when you need it.  We recommend signing up for the patient portal called "MyChart".  Sign up information is provided on this After Visit Summary.  MyChart is used to connect with patients for Virtual Visits (Telemedicine).  Patients are able to view lab/test results, encounter notes, upcoming appointments, etc.  Non-urgent messages can be sent to  your provider as well.   To learn more about what you can do with MyChart, go to [email or web address].    Your next appointment:   2 week(s)  The format for your next appointment:   In Person  Provider:   Thomasene Ripple, DO   Other Instructions   Coronary Angiogram With Stent Coronary angiogram with stent placement is a procedure to widen or open a narrow blood vessel of the heart (coronary artery). Arteries may become blocked by cholesterol buildup (plaques) in the lining of the artery wall. When a coronary artery becomes partially blocked, blood flow to that area decreases. This may lead to chest pain or a heart attack (myocardial infarction). A stent is a small piece of metal that looks like mesh or spring. Stent placement may be done as treatment after a heart attack, or to prevent a heart attack if a blocked artery is found by a coronary angiogram. Let your health care provider know about:  Any allergies you have, including allergies to medicines or contrast dye.  All medicines you are taking, including vitamins, herbs, eye drops, creams, and over-the-counter medicines.  Any problems you or family members have had with anesthetic medicines.  Any blood disorders you have.  Any surgeries you have had.  Any medical conditions you have, including kidney problems or kidney failure.  Whether you are pregnant or may be pregnant.  Whether you are breastfeeding. What are the risks? Generally, this is a safe procedure. However, serious problems may occur, including:  Damage to nearby structures or organs, such as the heart, blood vessels, or kidneys.  A return of blockage.  Bleeding, infection, or bruising at the insertion site.  A collection of blood under the skin (hematoma) at the insertion site.  A blood clot in another part of the body.  Allergic reaction to medicines or  dyes.  Bleeding into the abdomen (retroperitoneal bleeding).  Stroke (rare).  Heart attack  (rare). What happens before the procedure? Staying hydrated Follow instructions from your health care provider about hydration, which may include:  Up to 2 hours before the procedure - you may continue to drink clear liquids, such as water, clear fruit juice, black coffee, and plain tea.  Eating and drinking restrictions Follow instructions from your health care provider about eating and drinking, which may include:  8 hours before the procedure - stop eating heavy meals or foods, such as meat, fried foods, or fatty foods.  6 hours before the procedure - stop eating light meals or foods, such as toast or cereal.  2 hours before the procedure - stop drinking clear liquids. Medicines Ask your health care provider about:  Changing or stopping your regular medicines. This is especially important if you are taking diabetes medicines or blood thinners.  Taking medicines such as aspirin and ibuprofen. These medicines can thin your blood. Do not take these medicines unless your health care provider tells you to take them. ? Generally, aspirin is recommended before a thin tube, called a catheter, is passed through a blood vessel and inserted into the heart (cardiac catheterization).  Taking over-the-counter medicines, vitamins, herbs, and supplements. General instructions  Do not use any products that contain nicotine or tobacco for at least 4 weeks before the procedure. These products include cigarettes, e-cigarettes, and chewing tobacco. If you need help quitting, ask your health care provider.  Plan to have someone take you home from the hospital or clinic.  If you will be going home right after the procedure, plan to have someone with you for 24 hours.  You may have tests and imaging procedures.  Ask your health care provider: ? How your insertion site will be marked. Ask which artery will be used for the procedure. ? What steps will be taken to help prevent infection. These may  include:  Removing hair at the insertion site.  Washing skin with a germ-killing soap.  Taking antibiotic medicine. What happens during the procedure?   An IV will be inserted into one of your veins.  Electrodes may be placed on your chest to monitor your heart rate during the procedure.  You will be given one or more of the following: ? A medicine to help you relax (sedative). ? A medicine to numb the area (local anesthetic) for catheter insertion.  A small incision will be made for catheter insertion.  The catheter will be inserted into an artery using a guide wire. The location may be in your groin, your wrist, or the fold of your arm (near your elbow).  An X-ray procedure (fluoroscopy) will be used to help guide the catheter to the opening of the heart arteries.  A dye will be injected into the catheter. X-rays will be taken. The dye helps to show where any narrowing or blockages are located in the arteries.  Tell your health care provider if you have chest pain or trouble breathing.  A tiny wire will be guided to the blocked spot, and a balloon will be inflated to make the artery wider.  The stent will be expanded to crush the plaques into the wall of the vessel. The stent will hold the area open and improve the blood flow. Most stents have a drug coating to reduce the risk of the stent narrowing over time.  The artery may be made wider using a drill, laser, or  other tools that remove plaques.  The catheter will be removed when the blood flow improves. The stent will stay where it was placed, and the lining of the artery will grow over it.  A bandage (dressing) will be placed on the insertion site. Pressure will be applied to stop bleeding.  The IV will be removed. This procedure may vary among health care providers and hospitals. What happens after the procedure?  Your blood pressure, heart rate, breathing rate, and blood oxygen level will be monitored until you leave  the hospital or clinic.  If the procedure is done through the leg, you will lie flat in bed for a few hours or for as long as told by your health care provider. You will be instructed not to bend or cross your legs.  The insertion site and the pulse in your foot or wrist will be checked often.  You may have more blood tests, X-rays, and a test that records the electrical activity of your heart (electrocardiogram, or ECG).  Do not drive for 24 hours if you were given a sedative during your procedure. Summary  Coronary angiogram with stent placement is a procedure to widen or open a narrowed coronary artery. This is done to treat heart problems.  Before the procedure, let your health care provider know about all the medical conditions and surgeries you have or have had.  This is a safe procedure. However, some problems may occur, including damage to nearby structures or organs, bleeding, blood clots, or allergies.  Follow your health care provider's instructions about eating, drinking, medicines, and other lifestyle changes, such as quitting tobacco use before the procedure. This information is not intended to replace advice given to you by your health care provider. Make sure you discuss any questions you have with your health care provider. Document Revised: 09/06/2018 Document Reviewed: 09/06/2018 Elsevier Patient Education  2020 [employer].      Adopting a Healthy Lifestyle.  Know what a healthy weight is for you (roughly BMI <25) and aim to maintain this   Aim for 7+ servings of fruits and vegetables daily   65-80+ fluid ounces of water or unsweet tea for healthy kidneys   Limit to max 1 drink of alcohol per day; avoid smoking/tobacco   Limit animal fats in diet for cholesterol and heart health - choose grass fed whenever available   Avoid highly processed foods, and foods high in saturated/trans fats   Aim for low stress - take time to unwind and care for your mental  health   Aim for 150 min of moderate intensity exercise weekly for heart health, and weights twice weekly for bone health   Aim for 7-9 hours of sleep daily   When it comes to diets, agreement about the perfect plan isnt easy to find, even among the experts. Experts at the [facility] [employer] developed an idea known as the Healthy Eating Plate. Just imagine a plate divided into logical, healthy portions.   The emphasis is on diet quality:   Load up on vegetables and fruits - one-half of your plate: Aim for color and variety, and remember that potatoes dont count.   Go for whole grains - one-quarter of your plate: Whole wheat, barley, wheat berries, quinoa, oats, brown rice, and foods made with them. If you want pasta, go with whole wheat pasta.   Protein power - one-quarter of your plate: Fish, chicken, beans, and nuts are all healthy, versatile protein sources. Limit  red meat.   The diet, however, does go beyond the plate, offering a few other suggestions.   Use healthy plant oils, such as olive, canola, soy, corn, sunflower and peanut. Check the labels, and avoid partially hydrogenated oil, which have unhealthy trans fats.   If youre thirsty, drink water. Coffee and tea are good in moderation, but skip sugary drinks and limit milk and dairy products to one or two daily servings.   The type of carbohydrate in the diet is more important than the amount. Some sources of carbohydrates, such as vegetables, fruits, whole grains, and beans-are healthier than others.   Finally, stay active  Signed, Thomasene Ripple, DO  11/13/2019 2:21 PM    [facility]

## 2019-11-14 ENCOUNTER — Telehealth: Payer: Self-pay

## 2019-11-14 ENCOUNTER — Telehealth: Payer: Self-pay | Admitting: *Deleted

## 2019-11-14 LAB — BASIC METABOLIC PANEL
BUN/Creatinine Ratio: 15 (ref 9–20)
BUN: 15 mg/dL (ref 6–24)
CO2: 20 mmol/L (ref 20–29)
Calcium: 10.5 mg/dL — ABNORMAL HIGH (ref 8.7–10.2)
Chloride: 104 mmol/L (ref 96–106)
Creatinine, Ser: 1 mg/dL (ref 0.76–1.27)
GFR calc Af Amer: 99 mL/min/{1.73_m2} (ref >59)
GFR calc non Af Amer: 86 mL/min/{1.73_m2} (ref >59)
Glucose: 87 mg/dL (ref 65–99)
Potassium: 4.4 mmol/L (ref 3.5–5.2)
Sodium: 140 mmol/L (ref 134–144)

## 2019-11-14 LAB — CBC
Hematocrit: 44.5 % (ref 37.5–51.0)
Hemoglobin: 15.3 g/dL (ref 13.0–17.7)
MCH: 31.2 pg (ref 26.6–33.0)
MCHC: 34.4 g/dL (ref 31.5–35.7)
MCV: 91 fL (ref 79–97)
Platelets: 267 10*3/uL (ref 150–450)
RBC: 4.91 x10E6/uL (ref 4.14–5.80)
RDW: 13.6 % (ref 11.6–15.4)
WBC: 6.7 10*3/uL (ref 3.4–10.8)

## 2019-11-14 MED ORDER — SODIUM CHLORIDE 0.9% FLUSH: 3.0000 mL | Freq: Two times a day (BID) | INTRAVENOUS | Status: DC

## 2019-11-14 NOTE — Addendum Note (Signed)
Addended by: Thomasene Ripple on: 11/14/2019 12:56 PM   Modules accepted: Orders, SmartSet

## 2019-11-14 NOTE — Telephone Encounter (Signed)
Informed pt that we can not give him any more pain medication.

## 2019-11-14 NOTE — Telephone Encounter (Signed)
-----   Message from Thomasene Ripple, DO sent at 11/14/2019 12:56 PM EDT ----- Calcium is slightly elevated which sometimes can be due to dehydration.  Otherwise normal labs

## 2019-11-14 NOTE — Telephone Encounter (Signed)
Spoke with patient regarding results and recommendation.  Patient verbalizes understanding and is agreeable to plan of care. Advised patient to call back with any issues or concerns.  

## 2019-11-15 ENCOUNTER — Telehealth: Payer: Self-pay | Admitting: *Deleted

## 2019-11-15 NOTE — Telephone Encounter (Addendum)
Pt contacted pre-catheterization scheduled at [facility] for: Monday November 19, 2019 9 AM Verified arrival time and place: [facility] Main Entrance A [facility]) at: 7 AM   No solid food after midnight prior to cath, clear liquids until 5 AM day of procedure.   AM meds can be  taken pre-cath with sips of water including: ASA 81 mg Plavix 75 mg  Confirmed patient has responsible adult to drive home post procedure and be with patient first 24 hours after arriving home: yes  You are allowed ONE visitor in the waiting room during the time you are at the hospital for your procedure. Both you and your visitor must wear a mask once you enter the hospital.      I had been unable to contact patient and left message for pt's sister (DPR), Shanda Bumps to call me back. Reviewed procedure/mask/visitor instructions with patient's sister (DPR), Shanda Bumps. I asked Shanda Bumps  to remind patient to go for COVID test tomorrow at 1:25 PM and gave her test information/address in [place].   I called patient again after reviewing instructions with Shanda Bumps and was able to reach him this time. Pt states he is currently in [facility] ED waiting to be seen about pain in his legs. Pt states he wants to proceed with LHC/G 11/19/19 so he can get clearance for vascular surgery. I reviewed instructions for LHC/G with patient. Pt advised LHC/G is still scheduled for Monday 11/19/19, he is aware that he needs pre-procedure COVID done prior to LHC/G, gave him information/address for test location in [place]. Pt knows will be unable to do LHC/G without pre-procedure COVID.  Pt states he will call our office if his situation changes once he is seen in [facility] ED for his leg pain.

## 2019-11-15 NOTE — Telephone Encounter (Signed)
Spoke with pt regarding need for cardiac cath. Informed patient that the tests needed for cardiac clearance are determined by the cardiologist and that he would need to speak with them.

## 2019-11-16 ENCOUNTER — Telehealth: Payer: Self-pay | Admitting: Cardiology

## 2019-11-16 ENCOUNTER — Other Ambulatory Visit (HOSPITAL_COMMUNITY): Payer: Medicare HMO

## 2019-11-16 NOTE — Telephone Encounter (Signed)
New message     Pt is due to have a COVID test today for an upcoming cardiac cath procedure. He states that he has transportation problems and cannot get to [employer] today.  Can he get the COVID test at a local [facility]?  Please call

## 2019-11-16 NOTE — Telephone Encounter (Signed)
Called patient. He can't get to his covid test appointment today due to transportation issues. We have cancelled covid test appointment for tody and cath since he can't have cath without covid test.He will call to confirm that he can have covid test next Wednesday after that I will reschedule cath for him.

## 2019-11-16 NOTE — Telephone Encounter (Signed)
Patient called back to confirm he will be at his covid test appointment 11/21/2019.

## 2019-11-19 ENCOUNTER — Ambulatory Visit (HOSPITAL_COMMUNITY): Admission: RE | Admit: 2019-11-19 | Payer: Medicare HMO | Source: Home / Self Care | Admitting: Cardiovascular Disease

## 2019-11-19 ENCOUNTER — Encounter (HOSPITAL_COMMUNITY): Admission: RE | Payer: Self-pay | Source: Home / Self Care

## 2019-11-19 SURGERY — LEFT HEART CATH AND CORS/GRAFTS ANGIOGRAPHY
Anesthesia: LOCAL

## 2019-11-20 ENCOUNTER — Telehealth: Payer: Self-pay | Admitting: *Deleted

## 2019-11-20 NOTE — Telephone Encounter (Addendum)
Call placed to patient to confirm he had transportation to pre-procedure COVID-19 appointment 11/21/19, pt reports he does have transportation to appointment. Since patient would need to make arrangements for transportation to [facility] and has to contact his insurance company for transportation 3 days ahead of time, I went ahead and scheduled LHC/G with Dr End for Monday November 26, 2019 arrive [facility] 8:30 AM/procedure time 10:30 AM-pt is aware. Pt advised I will follow up with him Thursday to review instructions.

## 2019-11-21 ENCOUNTER — Other Ambulatory Visit (HOSPITAL_COMMUNITY)
Admission: RE | Admit: 2019-11-21 | Discharge: 2019-11-21 | Disposition: A | Discharge status: Home / Self Care | Payer: Medicare HMO | Source: Ambulatory Visit | Attending: Cardiovascular Disease | Admitting: Cardiovascular Disease

## 2019-11-21 ENCOUNTER — Other Ambulatory Visit (HOSPITAL_COMMUNITY): Payer: Self-pay

## 2019-11-21 DIAGNOSIS — Z01812 Encounter for preprocedural laboratory examination: Secondary | ICD-10-CM | POA: Insufficient documentation

## 2019-11-21 DIAGNOSIS — Z20822 Contact with and (suspected) exposure to covid-19: Secondary | ICD-10-CM | POA: Diagnosis not present

## 2019-11-21 LAB — SARS CORONAVIRUS 2 (TAT 6-24 HRS): SARS Coronavirus 2: NEGATIVE

## 2019-11-22 NOTE — Telephone Encounter (Signed)
Pt contacted pre-catheterization scheduled at [facility] for: Monday November 26, 2019 10:30 AM Verified arrival time and place: [facility] Main Entrance [facility]) at: 8:30 AM   No solid food after midnight prior to cath, clear liquids until 5 AM day of procedure.   AM meds can be  taken pre-cath with sips of water including: ASA 81 mg Plavix 75 mg  Confirmed patient has responsible adult to drive home post procedure and be with patient first 24 hours after arriving home: yes  You are allowed ONE visitor in the waiting room during the time you are at the hospital for your procedure. Both you and your visitor must wear a mask once you enter the hospital.       COVID-19 Pre-Screening Questions:  . In the past 14 days have you had a new cough, new headache, new nasal congestion, fever (100.4 or greater) unexplained body aches, new sore throat, or sudden loss of taste or sense of smell? no . In the past 14 days have you been around anyone with known Covid 19? no . Have you been vaccinated for COVID-19? Yes, see immunization history  Reviewed procedure/mask/visitor instructions, COVID-19 questions with patient.

## 2019-11-26 ENCOUNTER — Encounter (HOSPITAL_COMMUNITY)
Admission: RE | Disposition: A | Discharge status: Home / Self Care | Payer: Self-pay | Source: Home / Self Care | Attending: Internal Medicine

## 2019-11-26 ENCOUNTER — Other Ambulatory Visit: Payer: Self-pay

## 2019-11-26 ENCOUNTER — Ambulatory Visit (HOSPITAL_COMMUNITY)
Admission: RE | Admit: 2019-11-26 | Discharge: 2019-11-26 | Disposition: A | Discharge status: Home / Self Care | Payer: Medicare HMO | Attending: Internal Medicine | Admitting: Internal Medicine

## 2019-11-26 DIAGNOSIS — R7303 Prediabetes: Secondary | ICD-10-CM | POA: Insufficient documentation

## 2019-11-26 DIAGNOSIS — I10 Essential (primary) hypertension: Secondary | ICD-10-CM | POA: Insufficient documentation

## 2019-11-26 DIAGNOSIS — R0602 Shortness of breath: Secondary | ICD-10-CM | POA: Diagnosis not present

## 2019-11-26 DIAGNOSIS — I25700 Atherosclerosis of coronary artery bypass graft(s), unspecified, with unstable angina pectoris: Secondary | ICD-10-CM | POA: Diagnosis not present

## 2019-11-26 DIAGNOSIS — I739 Peripheral vascular disease, unspecified: Secondary | ICD-10-CM | POA: Insufficient documentation

## 2019-11-26 DIAGNOSIS — Z79899 Other long term (current) drug therapy: Secondary | ICD-10-CM | POA: Insufficient documentation

## 2019-11-26 DIAGNOSIS — F17290 Nicotine dependence, other tobacco product, uncomplicated: Secondary | ICD-10-CM | POA: Insufficient documentation

## 2019-11-26 DIAGNOSIS — I25810 Atherosclerosis of coronary artery bypass graft(s) without angina pectoris: Secondary | ICD-10-CM

## 2019-11-26 DIAGNOSIS — E782 Mixed hyperlipidemia: Secondary | ICD-10-CM | POA: Diagnosis not present

## 2019-11-26 DIAGNOSIS — R9439 Abnormal result of other cardiovascular function study: Secondary | ICD-10-CM

## 2019-11-26 DIAGNOSIS — I2582 Chronic total occlusion of coronary artery: Secondary | ICD-10-CM | POA: Diagnosis not present

## 2019-11-26 DIAGNOSIS — I252 Old myocardial infarction: Secondary | ICD-10-CM | POA: Diagnosis not present

## 2019-11-26 DIAGNOSIS — Z7982 Long term (current) use of aspirin: Secondary | ICD-10-CM | POA: Insufficient documentation

## 2019-11-26 DIAGNOSIS — R0789 Other chest pain: Secondary | ICD-10-CM | POA: Diagnosis present

## 2019-11-26 DIAGNOSIS — Z951 Presence of aortocoronary bypass graft: Secondary | ICD-10-CM | POA: Insufficient documentation

## 2019-11-26 DIAGNOSIS — Z95820 Peripheral vascular angioplasty status with implants and grafts: Secondary | ICD-10-CM

## 2019-11-26 HISTORY — PX: CORONARY STENT INTERVENTION: CATH118234

## 2019-11-26 HISTORY — PX: LEFT HEART CATH AND CORS/GRAFTS ANGIOGRAPHY: CATH118250

## 2019-11-26 LAB — POCT ACTIVATED CLOTTING TIME
Activated Clotting Time: 257 seconds
Activated Clotting Time: 263 seconds
Activated Clotting Time: 285 seconds
Activated Clotting Time: 312 seconds

## 2019-11-26 SURGERY — LEFT HEART CATH AND CORS/GRAFTS ANGIOGRAPHY
Anesthesia: LOCAL

## 2019-11-26 MED ORDER — LABETALOL HCL 5 MG/ML IV SOLN
10.0000 mg | INTRAVENOUS | Status: DC | PRN
  Administered 2019-11-26: 10 mg via INTRAVENOUS
  Filled 2019-11-26: qty 4

## 2019-11-26 MED ORDER — ASPIRIN 81 MG PO CHEW: 81.0000 mg | CHEWABLE_TABLET | Freq: Every day | ORAL | Status: DC

## 2019-11-26 MED ORDER — NITROGLYCERIN 1 MG/10 ML FOR IR/CATH LAB
INTRA_ARTERIAL | Status: AC
  Filled 2019-11-26: qty 10

## 2019-11-26 MED ORDER — VERAPAMIL HCL 2.5 MG/ML IV SOLN
INTRAVENOUS | Status: DC | PRN
  Administered 2019-11-26: 10 mL via INTRA_ARTERIAL

## 2019-11-26 MED ORDER — SODIUM CHLORIDE 0.9 % IV SOLN: INTRAVENOUS | Status: DC

## 2019-11-26 MED ORDER — CLOPIDOGREL BISULFATE 300 MG PO TABS
ORAL_TABLET | ORAL | Status: DC | PRN
  Administered 2019-11-26: 600 mg via ORAL

## 2019-11-26 MED ORDER — HEPARIN SODIUM (PORCINE) 1000 UNIT/ML IJ SOLN
INTRAMUSCULAR | Status: DC | PRN
  Administered 2019-11-26: 4500 [IU] via INTRAVENOUS
  Administered 2019-11-26: 2000 [IU] via INTRAVENOUS
  Administered 2019-11-26: 5000 [IU] via INTRAVENOUS
  Administered 2019-11-26 (×2): 2000 [IU] via INTRAVENOUS

## 2019-11-26 MED ORDER — CLOPIDOGREL BISULFATE 75 MG PO TABS: 75.0000 mg | ORAL_TABLET | Freq: Every day | ORAL | 3 refills | Status: DC

## 2019-11-26 MED ORDER — FENTANYL CITRATE (PF) 100 MCG/2ML IJ SOLN
INTRAMUSCULAR | Status: DC | PRN
Start: 2019-11-26 — End: 2019-11-26
  Administered 2019-11-26 (×2): 25 ug via INTRAVENOUS
  Administered 2019-11-26: 50 ug via INTRAVENOUS

## 2019-11-26 MED ORDER — HEPARIN SODIUM (PORCINE) 1000 UNIT/ML IJ SOLN
INTRAMUSCULAR | Status: AC
  Filled 2019-11-26: qty 1

## 2019-11-26 MED ORDER — NITROGLYCERIN 0.4 MG SL SUBL: 0.4000 mg | SUBLINGUAL_TABLET | SUBLINGUAL | 3 refills | Status: DC | PRN

## 2019-11-26 MED ORDER — HEPARIN (PORCINE) IN NACL 1000-0.9 UT/500ML-% IV SOLN
INTRAVENOUS | Status: AC
  Filled 2019-11-26: qty 1000

## 2019-11-26 MED ORDER — SODIUM CHLORIDE 0.9% FLUSH: 3.0000 mL | INTRAVENOUS | Status: DC | PRN

## 2019-11-26 MED ORDER — ACETAMINOPHEN 325 MG PO TABS: 650.0000 mg | ORAL_TABLET | ORAL | Status: DC | PRN

## 2019-11-26 MED ORDER — SODIUM CHLORIDE 0.9 % WEIGHT BASED INFUSION: 1.0000 mL/kg/h | INTRAVENOUS | Status: DC

## 2019-11-26 MED ORDER — VERAPAMIL HCL 2.5 MG/ML IV SOLN
INTRAVENOUS | Status: AC
  Filled 2019-11-26: qty 2

## 2019-11-26 MED ORDER — NITROGLYCERIN 1 MG/10 ML FOR IR/CATH LAB
INTRA_ARTERIAL | Status: DC | PRN
  Administered 2019-11-26 (×2): 200 ug via INTRACORONARY

## 2019-11-26 MED ORDER — LIDOCAINE HCL (PF) 1 % IJ SOLN
INTRAMUSCULAR | Status: AC
  Filled 2019-11-26: qty 30

## 2019-11-26 MED ORDER — IOHEXOL 350 MG/ML SOLN
INTRAVENOUS | Status: AC
  Filled 2019-11-26: qty 1

## 2019-11-26 MED ORDER — HEPARIN (PORCINE) IN NACL 1000-0.9 UT/500ML-% IV SOLN
INTRAVENOUS | Status: DC | PRN
  Administered 2019-11-26 (×2): 500 mL

## 2019-11-26 MED ORDER — HYDRALAZINE HCL 20 MG/ML IJ SOLN: 10.0000 mg | INTRAMUSCULAR | Status: DC | PRN

## 2019-11-26 MED ORDER — FAMOTIDINE IN NACL 20-0.9 MG/50ML-% IV SOLN
INTRAVENOUS | Status: AC
  Filled 2019-11-26: qty 50

## 2019-11-26 MED ORDER — SODIUM CHLORIDE 0.9 % IV SOLN: 250.0000 mL | INTRAVENOUS | Status: DC | PRN

## 2019-11-26 MED ORDER — LIDOCAINE HCL (PF) 1 % IJ SOLN
INTRAMUSCULAR | Status: DC | PRN
  Administered 2019-11-26 (×2): 2 mL via INTRADERMAL

## 2019-11-26 MED ORDER — SODIUM CHLORIDE 0.9 % WEIGHT BASED INFUSION
3.0000 mL/kg/h | INTRAVENOUS | Status: AC
  Administered 2019-11-26: 3 mL/kg/h via INTRAVENOUS

## 2019-11-26 MED ORDER — CLOPIDOGREL BISULFATE 300 MG PO TABS
ORAL_TABLET | ORAL | Status: AC
  Filled 2019-11-26: qty 1

## 2019-11-26 MED ORDER — FENTANYL CITRATE (PF) 100 MCG/2ML IJ SOLN
INTRAMUSCULAR | Status: AC
  Filled 2019-11-26: qty 2

## 2019-11-26 MED ORDER — SODIUM CHLORIDE 0.9% FLUSH: 3.0000 mL | Freq: Two times a day (BID) | INTRAVENOUS | Status: DC

## 2019-11-26 MED ORDER — CLOPIDOGREL BISULFATE 75 MG PO TABS: 75.0000 mg | ORAL_TABLET | Freq: Every day | ORAL | Status: DC

## 2019-11-26 MED ORDER — MIDAZOLAM HCL 2 MG/2ML IJ SOLN
INTRAMUSCULAR | Status: AC
  Filled 2019-11-26: qty 2

## 2019-11-26 MED ORDER — IOHEXOL 350 MG/ML SOLN
INTRAVENOUS | Status: DC | PRN
  Administered 2019-11-26: 185 mL

## 2019-11-26 MED ORDER — ONDANSETRON HCL 4 MG/2ML IJ SOLN: 4.0000 mg | Freq: Four times a day (QID) | INTRAMUSCULAR | Status: DC | PRN

## 2019-11-26 MED ORDER — MIDAZOLAM HCL 2 MG/2ML IJ SOLN
INTRAMUSCULAR | Status: DC | PRN
  Administered 2019-11-26: 1 mg via INTRAVENOUS

## 2019-11-26 MED ORDER — FAMOTIDINE IN NACL 20-0.9 MG/50ML-% IV SOLN
INTRAVENOUS | Status: AC | PRN
  Administered 2019-11-26: 20 mg via INTRAVENOUS

## 2019-11-26 SURGICAL SUPPLY — 25 items
BALLN SAPPHIRE 2.0X12 (BALLOONS) ×2
BALLN SAPPHIRE ~~LOC~~ 3.0X12 (BALLOONS) ×1 IMPLANT
BALLN ~~LOC~~ EUPHORA RX 2.75X8 (BALLOONS) ×2
BALLOON SAPPHIRE 2.0X12 (BALLOONS) IMPLANT
BALLOON ~~LOC~~ EUPHORA RX 2.75X8 (BALLOONS) IMPLANT
CATH INFINITI 5 FR IM (CATHETERS) ×1 IMPLANT
CATH INFINITI 5 FR MPA2 (CATHETERS) ×1 IMPLANT
CATH INFINITI 5FR MULTPACK ANG (CATHETERS) ×1 IMPLANT
CATH SWAN DBL LUMAN 5F 110 (CATHETERS) IMPLANT
CATH VISTA GUIDE 6FR MPA1 (CATHETERS) ×1 IMPLANT
CATHETER LAUNCHER 6FR MP1 (CATHETERS) ×1 IMPLANT
DEVICE RAD COMP TR BAND LRG (VASCULAR PRODUCTS) ×1 IMPLANT
GLIDESHEATH SLEND SS 6F .021 (SHEATH) ×1 IMPLANT
GUIDEWIRE .025 260CM (WIRE) ×1 IMPLANT
GUIDEWIRE INQWIRE 1.5J.035X260 (WIRE) IMPLANT
INQWIRE 1.5J .035X260CM (WIRE) ×2
KIT ENCORE 26 ADVANTAGE (KITS) ×1 IMPLANT
KIT HEART LEFT (KITS) ×2 IMPLANT
PACK CARDIAC CATHETERIZATION (CUSTOM PROCEDURE TRAY) ×2 IMPLANT
SHEATH GLIDE SLENDER 4/5FR (SHEATH) IMPLANT
STENT RESOLUTE ONYX 2.25X18 (Permanent Stent) ×1 IMPLANT
STENT RESOLUTE ONYX 2.75X18 (Permanent Stent) ×1 IMPLANT
TRANSDUCER W/STOPCOCK (MISCELLANEOUS) ×2 IMPLANT
TUBING CIL FLEX 10 FLL-RA (TUBING) ×2 IMPLANT
WIRE RUNTHROUGH .014X180CM (WIRE) ×1 IMPLANT

## 2019-11-26 NOTE — Discharge Instructions (Signed)
PLEASE REMEMBER TO BRING ALL OF YOUR MEDICATIONS TO EACH OF YOUR FOLLOW-UP OFFICE VISITS.  PLEASE ATTEND ALL SCHEDULED FOLLOW-UP APPOINTMENTS.   Activity: Increase activity slowly as tolerated. You may shower, but no soaking baths (or swimming) for 1 week. No driving for 24 hours. No lifting over 5 lbs for 1 week. No sexual activity for 1 week.   You May Return to Work: in 1 week (if applicable)  Wound Care: You may wash cath site gently with soap and water. Keep cath site clean and dry. If you notice pain, swelling, bleeding or pus at your cath site, please call [phone].   Please continue to take your aspirin and plavix without missing doses.   Drink plenty of fluid for 48 hours and keep wrist elevated at heart level for 24 hours  Radial Site Care   This sheet gives you information about how to care for yourself after your procedure. Your health care provider may also give you more specific instructions. If you have problems or questions, contact your health care provider. What can I expect after the procedure? After the procedure, it is common to have:  Bruising and tenderness at the catheter insertion area. Follow these instructions at home: Medicines  Take over-the-counter and prescription medicines only as told by your health care provider. Insertion site care 1. Follow instructions from your health care provider about how to take care of your insertion site. Make sure you: ? Wash your hands with soap and water before you change your bandage (dressing). If soap and water are not available, use hand sanitizer. ? remove your dressing as told by your health care provider. In 24 hours 2. Check your insertion site every day for signs of infection. Check for: ? Redness, swelling, or pain. ? Fluid or blood. ? Pus or a bad smell. ? Warmth. 3. Do not take baths, swim, or use a hot tub until your health care provider approves. 4. You may shower 24-48 hours after the procedure, or  as directed by your health care provider. ? Remove the dressing and gently wash the site with plain soap and water. ? Pat the area dry with a clean towel. ? Do not rub the site. That could cause bleeding. 5. Do not apply powder or lotion to the site. Activity   1. For 24 hours after the procedure, or as directed by your health care provider: ? Do not flex or bend the affected arm. ? Do not push or pull heavy objects with the affected arm. ? Do not drive yourself home from the hospital or clinic. You may drive 24 hours after the procedure unless your health care provider tells you not to. ? Do not operate machinery or power tools. 2. Do not lift anything that is heavier than 10 lb (4.5 kg), or the limit that you are told, until your health care provider says that it is safe. For 4 days 3. Ask your health care provider when it is okay to: ? Return to work or school. ? Resume usual physical activities or sports. ? Resume sexual activity. General instructions  If the catheter site starts to bleed, raise your arm and put firm pressure on the site. If the bleeding does not stop, get help right away. This is a medical emergency.  If you went home on the same day as your procedure, a responsible adult should be with you for the first 24 hours after you arrive home.  Keep all follow-up visits as told  by your health care provider. This is important. Contact a health care provider if:  You have a fever.  You have redness, swelling, or yellow drainage around your insertion site. Get help right away if:  You have unusual pain at the radial site.  The catheter insertion area swells very fast.  The insertion area is bleeding, and the bleeding does not stop when you hold steady pressure on the area.  Your arm or hand becomes pale, cool, tingly, or numb. These symptoms may represent a serious problem that is an emergency. Do not wait to see if the symptoms will go away. Get medical help right  away. Call your local emergency services (911 in the [place] Do not drive yourself to the hospital. Summary  After the procedure, it is common to have bruising and tenderness at the site.  Follow instructions from your health care provider about how to take care of your radial site wound. Check the wound every day for signs of infection.  Do not lift anything that is heavier than 10 lb (4.5 kg), or the limit that you are told, until your health care provider says that it is safe. This information is not intended to replace advice given to you by your health care provider. Make sure you discuss any questions you have with your health care provider. Document Revised: 03/23/2017 Document Reviewed: 03/23/2017 Elsevier Patient Education  2020 [employer].

## 2019-11-26 NOTE — Progress Notes (Signed)
[id number] Discussed with pt the importance of plavix with stent. Reviewed NTG use, walking for ex, heart healthy and low carb food choices, and CRP 2. Will refer to [facility]. Discussed smoking cessation and gave handout. Pt stated he has cut down to 3 a day from a pack and a half. Wife smokes and pt stated this makes it more difficult to quit. Encouraged him to call 1800 quit now. Luetta Nutting RN BSN 11/26/2019 2:10 PM

## 2019-11-26 NOTE — Interval H&P Note (Signed)
History and Physical Interval Note:  11/26/2019 9:03 AM  Benjamin Espinoza  has presented today for surgery, with the diagnosis of chest pain, shortness of breath, and abnormal stress test.  The various methods of treatment have been discussed with the patient and family. After consideration of risks, benefits and other options for treatment, the patient has consented to  Procedure(s): LEFT HEART CATH AND CORS/GRAFTS ANGIOGRAPHY (N/A) as a surgical intervention.  Given significant chronic shortness of breath, we will plan to perform right heart catheterization as well.  The patient's history has been reviewed, patient examined, no change in status, stable for surgery.  I have reviewed the patient's chart and labs.  Questions were answered to the patient's satisfaction.    Cath Lab Visit (complete for each Cath Lab visit)  Clinical Evaluation Leading to the Procedure:   ACS: No.  Non-ACS:    Anginal Classification: CCS III  Anti-ischemic medical therapy: Maximal Therapy (2 or more classes of medications)  Non-Invasive Test Results: Intermediate-risk stress test findings: cardiac mortality 1-3%/year  Prior CABG: Previous CABG  Benjamin Espinoza

## 2019-11-26 NOTE — Discharge Summary (Addendum)
Discharge Summary for Same Day PCI   Patient ID: Benjamin Espinoza MRN: [id number]; DOB: [birth year]  Admit date: 11/26/2019 Discharge date: 11/26/2019  Primary Care Provider: Teena Irani, PA-C  Primary Cardiologist: Thomasene Ripple, DO  Primary Electrophysiologist:  None   Discharge Diagnoses    Active Problems:   Abnormal stress test   Shortness of breath    Diagnostic Studies/Procedures    Cardiac Catheterization 11/26/2019: Conclusions: 1. Severe native coronary artery disease including 50% ostial LMCA stenosis, chronic total occlusions of the ostial, mid, and distal LAD, 80% stenosis of mid LCx as well as chronic total occlusion of OM1, and chronic total occlusion of the proximal through distal RCA.  There is also a 90% stenosis in the RPDA distal to the SVG anastomosis. 2. Widely patent LIMA to LAD, supplying a relatively small territory as the distal LAD is chronically occluded. 3. Patent SVG to OM1 with 30% ostial/proximal stenosis. 4. Widely patent SVG to OM 2. 5. Patent SVG to RPDA with 70% ostial/proximal disease as well as 90% stenosis at the distal anastomosis extending into the RPDA. 6. Normal left ventricular filling pressure with mildly to moderately reduced systolic function (LVEF ~45%). 7. Successful PCI to distal anastomosis of SVG to RPDA extending into the RPDA with a Resolute Onyx 2.25 x 18 mm drug-eluting stent (postdilated to 2.9 mm proximally) with 0% residual stenosis and TIMI-3 flow. 8. Successful PCI to ostial/proximal SVG to RPDA using Resolute Onyx 2.75 x 18 mm drug-eluting stent with 0% residual stenosis and TIMI-3 flow.  Recommendations: 1. Continue dual antiplatelet therapy with aspirin and clopidogrel for at least 6 months, ideally longer. 2. Continue aggressive secondary prevention as well as antianginal therapy with ranolazine. 3. Anticipate same-day discharge this afternoon if no post catheterization complications. Diagnostic Dominance:  Right   Intervention     _____________   History of Present Illness     Benjamin Espinoza is a 54 y.o. male with a PMH of CAD s/p CABG in 2017, HTN, HLD, pre-diabetes, DDD, and tobacco abuse. He was evaluated outpatient by Dr. Servando Salina 11/13/19 in follow-up of recent complaints of intermittent chest pain and SOB. Prior to this visit he was started on ranexa for antianginal effects and underwent an NST which revealed reversible defects.   Cardiac catheterization was arranged for further evaluation.  Hospital Course     The patient underwent cardiac cath as noted above with PCI/DES to proximal and distal SVG-RPDA. Plan for DAPT with ASA/plavix for at least 6. The patient was seen by cardiac rehab while in short stay. There were no observed complications post cath. Radial cath site was re-evaluated prior to discharge and found to be stable without any complications. Instructions/precautions regarding cath site care were given prior to discharge.  ALDON HENGST was seen by Dr. Okey Dupre and determined stable for discharge home. Follow up with our office has been arranged. Medications are listed below. Pertinent changes include no change to medications.  _____________  Cath/PCI Registry Performance & Quality Measures: 1. Aspirin prescribed? - Yes 2. ADP Receptor Inhibitor (Plavix/Clopidogrel, Brilinta/Ticagrelor or Effient/Prasugrel) prescribed (includes medically managed patients)? - Yes 3. High Intensity Statin (Lipitor 40-80mg  or Crestor 20-40mg ) prescribed? - No - intolerant - on Repatha 4. For EF <40%, was ACEI/ARB prescribed? - Not Applicable (EF >/= 40%) 5. For EF <40%, Aldosterone Antagonist (Spironolactone or Eplerenone) prescribed? - Not Applicable (EF >/= 40%) 6. Cardiac Rehab Phase II ordered (Included Medically managed Patients)? - Yes  _____________   Discharge  Vitals Blood pressure (!) 146/93, pulse 67, temperature 97.7 F (36.5 C), temperature source Oral, resp. rate 17,  height 6\' 1"  (1.854 m), weight 89.8 kg, SpO2 100 %.  Filed Weights   11/26/19 0807  Weight: 89.8 kg    Last Labs & Radiologic Studies    CBC No results for input(s): WBC, NEUTROABS, HGB, HCT, MCV, PLT in the last 72 hours. Basic Metabolic Panel No results for input(s): NA, K, CL, CO2, GLUCOSE, BUN, CREATININE, CALCIUM, MG, PHOS in the last 72 hours. Liver Function Tests No results for input(s): AST, ALT, ALKPHOS, BILITOT, PROT, ALBUMIN in the last 72 hours. No results for input(s): LIPASE, AMYLASE in the last 72 hours. High Sensitivity Troponin:   No results for input(s): TROPONINIHS in the last 720 hours.  BNP Invalid input(s): POCBNP D-Dimer No results for input(s): DDIMER in the last 72 hours. Hemoglobin A1C No results for input(s): HGBA1C in the last 72 hours. Fasting Lipid Panel No results for input(s): CHOL, HDL, LDLCALC, TRIG, CHOLHDL, LDLDIRECT in the last 72 hours. Thyroid Function Tests No results for input(s): TSH, T4TOTAL, T3FREE, THYROIDAB in the last 72 hours.  Invalid input(s): FREET3 _____________  CARDIAC CATHETERIZATION  Result Date: 11/26/2019 Conclusions: 1. Severe native coronary artery disease including 50% ostial LMCA stenosis, chronic total occlusions of the ostial, mid, and distal LAD, 80% stenosis of mid LCx as well as chronic total occlusion of OM1, and chronic total occlusion of the proximal through distal RCA.  There is also a 90% stenosis in the RPDA distal to the SVG anastomosis. 2. Widely patent LIMA to LAD, supplying a relatively small territory as the distal LAD is chronically occluded. 3. Patent SVG to OM1 with 30% ostial/proximal stenosis. 4. Widely patent SVG to OM 2. 5. Patent SVG to RPDA with 70% ostial/proximal disease as well as 90% stenosis at the distal anastomosis extending into the RPDA. 6. Normal left ventricular filling pressure with mildly to moderately reduced systolic function (LVEF ~45%). 7. Successful PCI to distal anastomosis of SVG  to RPDA extending into the RPDA with a Resolute Onyx 2.25 x 18 mm drug-eluting stent (postdilated to 2.9 mm proximally) with 0% residual stenosis and TIMI-3 flow. 8. Successful PCI to ostial/proximal SVG to RPDA using Resolute Onyx 2.75 x 18 mm drug-eluting stent with 0% residual stenosis and TIMI-3 flow. Recommendations: 1. Continue dual antiplatelet therapy with aspirin and clopidogrel for at least 6 months, ideally longer. 2. Continue aggressive secondary prevention as well as antianginal therapy with ranolazine. 3. Anticipate same-day discharge this afternoon if no post catheterization complications. 11/28/2019, MD [facility]    Disposition   Pt is being discharged home today in good condition.  Follow-up Plans & Appointments     Follow-up Information    [place], [facility], DO Follow up on 11/29/2019.   Specialty: Cardiology Why: Please arrive 15 minutes early for your 1:00pm post-cath cardiology appointment Contact information: [address] [place] [phone]              Discharge Instructions    Amb Referral to Cardiac Rehabilitation   Complete by: As directed    Referring to [place] CRP 2   Diagnosis: Coronary Stents   After initial evaluation and assessments completed: Virtual Based Care may be provided alone or in conjunction with Phase 2 Cardiac Rehab based on patient barriers.: Yes       Discharge Medications   Allergies as of 11/26/2019   No Known Allergies  Medication List    TAKE these medications   aspirin EC 81 MG tablet Take 2 tablets (162 mg total) by mouth daily.   atorvastatin 80 MG tablet Commonly known as: LIPITOR Take 80 mg by mouth daily.   cilostazol 100 MG tablet Commonly known as: PLETAL Take 100 mg by mouth daily.   clopidogrel 75 MG tablet Commonly known as: PLAVIX Take 75 mg by mouth daily.   gabapentin 600 MG tablet Commonly known as: NEURONTIN Take 600 mg by mouth 2 (two) times daily as needed  (Pain).   metoprolol succinate 25 MG 24 hr tablet Commonly known as: TOPROL-XL Take 12.5 mg by mouth daily.   oxyCODONE 5 MG immediate release tablet Commonly known as: Roxicodone Take 1 tablet (5 mg total) by mouth every 4 (four) hours as needed for severe pain.   oxyCODONE-acetaminophen 5-325 MG tablet Commonly known as: Percocet Take 1 tablet by mouth every 4 (four) hours as needed for severe pain.   ranolazine 500 MG 12 hr tablet Commonly known as: Ranexa Take 1 tablet (500 mg total) by mouth 2 (two) times daily.   Repatha SureClick 140 MG/ML Soaj Generic drug: Evolocumab Inject the contents of 1 pen (140 mg) under the skin every fourteen (14) days. What changed: See the new instructions.          Allergies No Known Allergies  Outstanding Labs/Studies   None  Duration of Discharge Encounter   Greater than 30 minutes including physician time.  Signed, Beatriz Stallion, PA-C 11/26/2019, 3:02 PM   I have independently seen and examined the patient and agree with the findings and plan, as documented in the PA/NP's note.  Mr. Minner feels well.  Left radial cath site is covered with a clean dressing; no hematoma noted.  Importance of medication compliance, in particular with DAPT, was stressed.  Tobacco cessation as well as other lifesytle modifications were also recommended.  Mr. Youman is stable for discharge this afternoon.  He should f/u with Dr. Servando Salina as scheduled.  Yvonne Kendall, MD [facility]

## 2019-11-26 NOTE — Progress Notes (Signed)
Patient was given discharge instructions. He verbalized understanding. 

## 2019-11-27 ENCOUNTER — Ambulatory Visit: Payer: Medicare HMO | Admitting: Cardiology

## 2019-11-27 ENCOUNTER — Encounter (HOSPITAL_COMMUNITY): Payer: Self-pay | Admitting: Internal Medicine

## 2019-11-27 ENCOUNTER — Other Ambulatory Visit: Payer: Self-pay

## 2019-11-28 ENCOUNTER — Encounter: Payer: Self-pay | Admitting: Vascular Surgery

## 2019-11-28 ENCOUNTER — Other Ambulatory Visit: Payer: Self-pay

## 2019-11-28 ENCOUNTER — Ambulatory Visit (HOSPITAL_COMMUNITY)
Admission: RE | Admit: 2019-11-28 | Discharge: 2019-11-28 | Disposition: A | Discharge status: Home / Self Care | Payer: Medicare HMO | Source: Ambulatory Visit | Attending: Vascular Surgery | Admitting: Vascular Surgery

## 2019-11-28 ENCOUNTER — Ambulatory Visit (INDEPENDENT_AMBULATORY_CARE_PROVIDER_SITE_OTHER): Payer: Medicare HMO | Admitting: Vascular Surgery

## 2019-11-28 ENCOUNTER — Encounter (HOSPITAL_COMMUNITY): Payer: Self-pay

## 2019-11-28 VITALS — BP 155/107 | HR 67 | Temp 98.3°F | Resp 20 | Ht 73.0 in | Wt 194.0 lb

## 2019-11-28 DIAGNOSIS — I739 Peripheral vascular disease, unspecified: Secondary | ICD-10-CM

## 2019-11-28 DIAGNOSIS — I714 Abdominal aortic aneurysm, without rupture, unspecified: Secondary | ICD-10-CM

## 2019-11-28 NOTE — H&P (View-Only) (Signed)
REASON FOR VISIT:   Bilateral lower extremity claudication  MEDICAL ISSUES:   PERIPHERAL VASCULAR DISEASE: Based on the patient's previous arteriogram on the left side he may be a candidate for angioplasty of the left superficial femoral artery.  He has a mild stenosis in the external iliac artery that might also need to be addressed although he has a fairly strong femoral pulse on the left.  I would like to address the left side first as on the right side he would require a bypass in accessing the right above his bypass would be associated with slightly increased risk of compromising his graft.  Once the left leg is addressed I think his only option on the right would be a femoral to below-knee popliteal artery bypass with PTFE given that he does not have vein available.  I have reviewed with the patient the indications for arteriography. In addition, I have reviewed the potential complications of arteriography including but not limited to: Bleeding, arterial injury, arterial thrombosis, dye action, renal insufficiency, or other unpredictable medical problems. I have explained to the patient that if we find disease amenable to angioplasty we could potentially address this at the same time. I have discussed the potential complications of angioplasty and stenting, including but not limited to: Bleeding, arterial thrombosis, arterial injury, dissection, or the need for surgical intervention.  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  He will need a formal duplex of his abdominal aorta to rule out an abdominal aortic aneurysm.  I will arrange the study when he returns for follow-up after his upcoming arteriogram on October 8.  PAIN MANAGEMENT: He is continuing to have significant pain related to his back and legs.  After a long discussion I have agreed to give him enough pain medications until he gets to see his pain management doctor which is not  scheduled for several weeks.  I have written a prescription for Percocet 5/325 1-2 every 6 hours as needed for pain #30.    HPI:   Benjamin Espinoza is a pleasant 54 y.o. male who I saw in the emergency department on 09/04/2019 with right lower extremity claudication which she had for several months.  He was a heavy smoker up to 1-1/2 packs/day but had cut back to 3 cigarettes a day.  He had a previous myocardial infarction and has undergone previous coronary revascularization.  Vein was taken from the right leg.  He also has a history of congestive heart failure.  He was set up for an arteriogram which was performed on 09/21/2019.  His arteriogram showed mild ectasia of the infrarenal aorta.  On the right side, there was moderate diffuse disease of the right common iliac artery with mild diffuse disease of the right external iliac artery.  The superficial femoral artery was occluded in the proximal thigh with reconstitution of the above-knee popliteal artery.  The below-knee popliteal artery segment.  The anterior tibial was occluded the dominant runoff was the peroneal artery.  The posterior tibial artery was occluded with reconstitution of the distal posterior tibial artery.  On the left side the left common iliac artery was patent.  The hypogastric was occluded.  There was a 40% stenosis in the proximal external iliac artery on the left.  There was a 90% stenosis in the distal SFA with moderate diffuse disease below that.  The anterior tibial artery was occluded.  The posterior tibial was occluded.  The dominant runoff was  the peroneal which reconstituted the posterior tibial artery distally.  Based on his arteriogram we discussed the importance of tobacco cessation and a structured walking program.  We also discussed the importance of nutrition.  He was on aspirin and a statin.  He was set up for an outpatient visit.  He tells me that 2 weeks ago he underwent PTCA and is now on Plavix in addition to his  aspirin and statin.  He is doing well status post PTCA.  He is continuing to have severe bilateral leg claudication which is more significant on the right side.  In addition he has rest pain in the right leg.  He denies any nonhealing ulcers.  He is cut back to about 3 cigarettes a day.  He had been a heavy smoker for some time.  He states that he simply unable to walk.   Past Medical History:  Diagnosis Date  . Abnormal stress test   . Atypical chest pain 05/07/2017  . DDD (degenerative disc disease), lumbar 11/08/2019   Formatting of this note might be different from the original. [facility]  . Essential hypertension   . 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 Hyperlipidemia   . Myocardial infarction (HCC)   . PVD (peripheral vascular disease) (HCC) 09/21/2019  . Shortness of breath   . Tobacco use disorder     History reviewed. No pertinent family history.  SOCIAL HISTORY: Social History   Tobacco Use  . Smoking status: Current Every Day Smoker    Types: Cigars  . Smokeless tobacco: Never Used  Substance Use Topics  . Alcohol use: No    No Known Allergies  Current Outpatient Medications  Medication Sig Dispense Refill  . aspirin EC 81 MG tablet Take 2 tablets (162 mg total) by mouth daily. 30 tablet 0  . atorvastatin (LIPITOR) 80 MG tablet Take 80 mg by mouth daily.    . Buprenorphine HCl-Naloxone HCl 8-2 MG FILM Place 2 strips under the tongue daily.    . cilostazol (PLETAL) 100 MG tablet Take 100 mg by mouth daily.     . clopidogrel (PLAVIX) 75 MG tablet Take 1 tablet (75 mg total) by mouth daily. 90 tablet 3  . gabapentin (NEURONTIN) 600 MG tablet Take 600 mg by mouth 2 (two) times daily as needed (Pain).     . metoprolol succinate (TOPROL-XL) 25 MG 24 hr tablet Take 12.5 mg by mouth daily.    . nitroGLYCERIN (NITROSTAT) 0.4 MG SL tablet Place 1 tablet (0.4 mg total) under the tongue every 5 (five) minutes as needed for chest pain. 25 tablet 3  . REPATHA SURECLICK 140  MG/ML SOAJ Inject the contents of 1 pen (140 mg) under the skin every fourteen (14) days. (Patient taking differently: Inject 140 mg as directed every 14 (fourteen) days. ) 6 mL 3  . oxyCODONE (ROXICODONE) 5 MG immediate release tablet Take 1 tablet (5 mg total) by mouth every 4 (four) hours as needed for severe pain. (Patient not taking: Reported on 11/15/2019) 15 tablet 0  . oxyCODONE-acetaminophen (PERCOCET) 5-325 MG tablet Take 1 tablet by mouth every 4 (four) hours as needed for severe pain. (Patient not taking: Reported on 11/15/2019) 20 tablet 0  . ranolazine (RANEXA) 500 MG 12 hr tablet Take 1 tablet (500 mg total) by mouth 2 (two) times daily. (Patient not taking: Reported on 11/15/2019) 60 tablet 3   Current Facility-Administered Medications  Medication Dose Route Frequency Provider Last Rate Last Admin  .  sodium chloride flush (NS) 0.9 % injection 3 mL  3 mL Intravenous Q12H Tobb, Kardie, DO        REVIEW OF SYSTEMS:  [X]  denotes positive finding, [ ]  denotes negative finding Cardiac  Comments:  Chest pain or chest pressure:    Shortness of breath upon exertion:    Short of breath when lying flat:    Irregular heart rhythm:        Vascular    Pain in calf, thigh, or hip brought on by ambulation: x   Pain in feet at night that wakes you up from your sleep:  x   Blood clot in your veins:    Leg swelling:         Pulmonary    Oxygen at home:    Productive cough:     Wheezing:         Neurologic    Sudden weakness in arms or legs:     Sudden numbness in arms or legs:     Sudden onset of difficulty speaking or slurred speech:    Temporary loss of vision in one eye:     Problems with dizziness:         Gastrointestinal    Blood in stool:     Vomited blood:         Genitourinary    Burning when urinating:     Blood in urine:        Psychiatric    Major depression:         Hematologic    Bleeding problems:    Problems with blood clotting too easily:        Skin      Rashes or ulcers:        Constitutional    Fever or chills:     PHYSICAL EXAM:   Vitals:   11/28/19 0921  BP: (!) 155/107  Pulse: 67  Resp: 20  Temp: 98.3 F (36.8 C)  SpO2: 98%  Weight: 194 lb (88 kg)  Height: 6\' 1"  (1.854 m)    GENERAL: The patient is a well-nourished male, in no acute distress. The vital signs are documented above. CARDIAC: There is a regular rate and rhythm.  VASCULAR: I do not detect carotid bruits. On the right side he has a slightly diminished femoral pulse.  I cannot palpate pedal pulses. On the left side he has a fairly normal femoral pulse.  I cannot palpate pedal pulses. PULMONARY: There is good air exchange bilaterally without wheezing or rales. ABDOMEN: Soft and non-tender with normal pitched bowel sounds.  MUSCULOSKELETAL: There are no major deformities or cyanosis. NEUROLOGIC: No focal weakness or paresthesias are detected. SKIN: There are no ulcers or rashes noted. PSYCHIATRIC: The patient has a normal affect.  DATA:    VEIN MAP: I have independently interpreted his vein map today.  The great saphenous vein cannot be identified in either leg.  [facility] [phone]

## 2019-11-28 NOTE — Progress Notes (Signed)
  REASON FOR VISIT:   Bilateral lower extremity claudication  MEDICAL ISSUES:   PERIPHERAL VASCULAR DISEASE: Based on the patient's previous arteriogram on the left side he may be a candidate for angioplasty of the left superficial femoral artery.  He has a mild stenosis in the external iliac artery that might also need to be addressed although he has a fairly strong femoral pulse on the left.  I would like to address the left side first as on the right side he would require a bypass in accessing the right above his bypass would be associated with slightly increased risk of compromising his graft.  Once the left leg is addressed I think his only option on the right would be a femoral to below-knee popliteal artery bypass with PTFE given that he does not have vein available.  I have reviewed with the patient the indications for arteriography. In addition, I have reviewed the potential complications of arteriography including but not limited to: Bleeding, arterial injury, arterial thrombosis, dye action, renal insufficiency, or other unpredictable medical problems. I have explained to the patient that if we find disease amenable to angioplasty we could potentially address this at the same time. I have discussed the potential complications of angioplasty and stenting, including but not limited to: Bleeding, arterial thrombosis, arterial injury, dissection, or the need for surgical intervention.  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  He will need a formal duplex of his abdominal aorta to rule out an abdominal aortic aneurysm.  I will arrange the study when he returns for follow-up after his upcoming arteriogram on October 8.  PAIN MANAGEMENT: He is continuing to have significant pain related to his back and legs.  After a long discussion I have agreed to give him enough pain medications until he gets to see his pain management doctor which is not  scheduled for several weeks.  I have written a prescription for Percocet 5/325 1-2 every 6 hours as needed for pain #30.    HPI:   Benjamin Espinoza is a pleasant 54 y.o. male who I saw in the emergency department on 09/04/2019 with right lower extremity claudication which she had for several months.  He was a heavy smoker up to 1-1/2 packs/day but had cut back to 3 cigarettes a day.  He had a previous myocardial infarction and has undergone previous coronary revascularization.  Vein was taken from the right leg.  He also has a history of congestive heart failure.  He was set up for an arteriogram which was performed on 09/21/2019.  His arteriogram showed mild ectasia of the infrarenal aorta.  On the right side, there was moderate diffuse disease of the right common iliac artery with mild diffuse disease of the right external iliac artery.  The superficial femoral artery was occluded in the proximal thigh with reconstitution of the above-knee popliteal artery.  The below-knee popliteal artery segment.  The anterior tibial was occluded the dominant runoff was the peroneal artery.  The posterior tibial artery was occluded with reconstitution of the distal posterior tibial artery.  On the left side the left common iliac artery was patent.  The hypogastric was occluded.  There was a 40% stenosis in the proximal external iliac artery on the left.  There was a 90% stenosis in the distal SFA with moderate diffuse disease below that.  The anterior tibial artery was occluded.  The posterior tibial was occluded.  The dominant runoff was   the peroneal which reconstituted the posterior tibial artery distally.  Based on his arteriogram we discussed the importance of tobacco cessation and a structured walking program.  We also discussed the importance of nutrition.  He was on aspirin and a statin.  He was set up for an outpatient visit.  He tells me that 2 weeks ago he underwent PTCA and is now on Plavix in addition to his  aspirin and statin.  He is doing well status post PTCA.  He is continuing to have severe bilateral leg claudication which is more significant on the right side.  In addition he has rest pain in the right leg.  He denies any nonhealing ulcers.  He is cut back to about 3 cigarettes a day.  He had been a heavy smoker for some time.  He states that he simply unable to walk.   Past Medical History:  Diagnosis Date  . Abnormal stress test   . Atypical chest pain 05/07/2017  . DDD (degenerative disc disease), lumbar 11/08/2019   Formatting of this note might be different from the original. [facility]  . Essential hypertension   . 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 Hyperlipidemia   . Myocardial infarction (HCC)   . PVD (peripheral vascular disease) (HCC) 09/21/2019  . Shortness of breath   . Tobacco use disorder     History reviewed. No pertinent family history.  SOCIAL HISTORY: Social History   Tobacco Use  . Smoking status: Current Every Day Smoker    Types: Cigars  . Smokeless tobacco: Never Used  Substance Use Topics  . Alcohol use: No    No Known Allergies  Current Outpatient Medications  Medication Sig Dispense Refill  . aspirin EC 81 MG tablet Take 2 tablets (162 mg total) by mouth daily. 30 tablet 0  . atorvastatin (LIPITOR) 80 MG tablet Take 80 mg by mouth daily.    . Buprenorphine HCl-Naloxone HCl 8-2 MG FILM Place 2 strips under the tongue daily.    . cilostazol (PLETAL) 100 MG tablet Take 100 mg by mouth daily.     . clopidogrel (PLAVIX) 75 MG tablet Take 1 tablet (75 mg total) by mouth daily. 90 tablet 3  . gabapentin (NEURONTIN) 600 MG tablet Take 600 mg by mouth 2 (two) times daily as needed (Pain).     . metoprolol succinate (TOPROL-XL) 25 MG 24 hr tablet Take 12.5 mg by mouth daily.    . nitroGLYCERIN (NITROSTAT) 0.4 MG SL tablet Place 1 tablet (0.4 mg total) under the tongue every 5 (five) minutes as needed for chest pain. 25 tablet 3  . REPATHA SURECLICK 140  MG/ML SOAJ Inject the contents of 1 pen (140 mg) under the skin every fourteen (14) days. (Patient taking differently: Inject 140 mg as directed every 14 (fourteen) days. ) 6 mL 3  . oxyCODONE (ROXICODONE) 5 MG immediate release tablet Take 1 tablet (5 mg total) by mouth every 4 (four) hours as needed for severe pain. (Patient not taking: Reported on 11/15/2019) 15 tablet 0  . oxyCODONE-acetaminophen (PERCOCET) 5-325 MG tablet Take 1 tablet by mouth every 4 (four) hours as needed for severe pain. (Patient not taking: Reported on 11/15/2019) 20 tablet 0  . ranolazine (RANEXA) 500 MG 12 hr tablet Take 1 tablet (500 mg total) by mouth 2 (two) times daily. (Patient not taking: Reported on 11/15/2019) 60 tablet 3   Current Facility-Administered Medications  Medication Dose Route Frequency Provider Last Rate Last Admin  .   sodium chloride flush (NS) 0.9 % injection 3 mL  3 mL Intravenous Q12H Tobb, Kardie, DO        REVIEW OF SYSTEMS:  [X] denotes positive finding, [ ] denotes negative finding Cardiac  Comments:  Chest pain or chest pressure:    Shortness of breath upon exertion:    Short of breath when lying flat:    Irregular heart rhythm:        Vascular    Pain in calf, thigh, or hip brought on by ambulation: x   Pain in feet at night that wakes you up from your sleep:  x   Blood clot in your veins:    Leg swelling:         Pulmonary    Oxygen at home:    Productive cough:     Wheezing:         Neurologic    Sudden weakness in arms or legs:     Sudden numbness in arms or legs:     Sudden onset of difficulty speaking or slurred speech:    Temporary loss of vision in one eye:     Problems with dizziness:         Gastrointestinal    Blood in stool:     Vomited blood:         Genitourinary    Burning when urinating:     Blood in urine:        Psychiatric    Major depression:         Hematologic    Bleeding problems:    Problems with blood clotting too easily:        Skin      Rashes or ulcers:        Constitutional    Fever or chills:     PHYSICAL EXAM:   Vitals:   11/28/19 0921  BP: (!) 155/107  Pulse: 67  Resp: 20  Temp: 98.3 F (36.8 C)  SpO2: 98%  Weight: 194 lb (88 kg)  Height: 6' 1" (1.854 m)    GENERAL: The patient is a well-nourished male, in no acute distress. The vital signs are documented above. CARDIAC: There is a regular rate and rhythm.  VASCULAR: I do not detect carotid bruits. On the right side he has a slightly diminished femoral pulse.  I cannot palpate pedal pulses. On the left side he has a fairly normal femoral pulse.  I cannot palpate pedal pulses. PULMONARY: There is good air exchange bilaterally without wheezing or rales. ABDOMEN: Soft and non-tender with normal pitched bowel sounds.  MUSCULOSKELETAL: There are no major deformities or cyanosis. NEUROLOGIC: No focal weakness or paresthesias are detected. SKIN: There are no ulcers or rashes noted. PSYCHIATRIC: The patient has a normal affect.  DATA:    VEIN MAP: I have independently interpreted his vein map today.  The great saphenous vein cannot be identified in either leg.  Benjamin Espinoza [facility] [place] [facility] [phone] 

## 2019-11-29 ENCOUNTER — Encounter: Payer: Self-pay | Admitting: Cardiology

## 2019-11-29 ENCOUNTER — Ambulatory Visit (INDEPENDENT_AMBULATORY_CARE_PROVIDER_SITE_OTHER): Payer: Medicare HMO | Admitting: Cardiology

## 2019-11-29 ENCOUNTER — Encounter: Payer: Self-pay | Admitting: Vascular Surgery

## 2019-11-29 VITALS — BP 140/98 | HR 59 | Ht 71.0 in | Wt 195.4 lb

## 2019-11-29 DIAGNOSIS — I739 Peripheral vascular disease, unspecified: Secondary | ICD-10-CM

## 2019-11-29 DIAGNOSIS — F17200 Nicotine dependence, unspecified, uncomplicated: Secondary | ICD-10-CM

## 2019-11-29 DIAGNOSIS — I2510 Atherosclerotic heart disease of native coronary artery without angina pectoris: Secondary | ICD-10-CM

## 2019-11-29 DIAGNOSIS — R001 Bradycardia, unspecified: Secondary | ICD-10-CM

## 2019-11-29 DIAGNOSIS — I10 Essential (primary) hypertension: Secondary | ICD-10-CM

## 2019-11-29 DIAGNOSIS — E782 Mixed hyperlipidemia: Secondary | ICD-10-CM

## 2019-11-29 HISTORY — DX: Bradycardia, unspecified: R00.1

## 2019-11-29 MED ORDER — LOSARTAN POTASSIUM 25 MG PO TABS: 25.0000 mg | ORAL_TABLET | Freq: Every day | ORAL | 3 refills | Status: DC

## 2019-11-29 NOTE — Patient Instructions (Addendum)
Medication Instructions:  Start Losartan (Cozaar) 25 mg daily   *If you need a refill on your cardiac medications before your next appointment, please call your pharmacy*   Lab Work: None ordered   If you have labs (blood work) drawn today and your tests are completely normal, you will receive your results only by: Marland Kitchen MyChart Message (if you have MyChart) OR . A paper copy in the mail If you have any lab test that is abnormal or we need to change your treatment, we will call you to review the results.   Testing/Procedures: None ordered    Follow-Up: At [facility], you and your health needs are our priority.  As part of our continuing mission to provide you with exceptional heart care, we have created designated Provider Care Teams.  These Care Teams include your primary Cardiologist (physician) and Advanced Practice Providers (APPs -  Physician Assistants and Nurse Practitioners) who all work together to provide you with the care you need, when you need it.  We recommend signing up for the patient portal called "MyChart".  Sign up information is provided on this After Visit Summary.  MyChart is used to connect with patients for Virtual Visits (Telemedicine).  Patients are able to view lab/test results, encounter notes, upcoming appointments, etc.  Non-urgent messages can be sent to your provider as well.   To learn more about what you can do with MyChart, go to [email or web address].    Your next appointment:   3 month(s)  The format for your next appointment:   In Person  Provider:   Thomasene Ripple, DO   Other Instructions None

## 2019-11-29 NOTE — Progress Notes (Signed)
f °

## 2019-11-29 NOTE — Progress Notes (Addendum)
Cardiology Office Note:    Date:  11/29/2019   ID:  Benjamin Espinoza, DOB September 20, 1965, MRN [id number]  PCP:  Teena Irani, PA-C  Cardiologist:  Thomasene Ripple, DO  Electrophysiologist:  None   Referring MD: Teena Irani, PA-C   Follow up visit   History of Present Illness:    Benjamin Espinoza a 54 y.o.malewith a hx of coronary artery disease status post CABG in 2017 in [place], on April 24, 2019 the patient did have unstable angina was transferred to [facility] where he underwent a left heart catheterization showing all his grafts were patent, he recently underwent a work-up with nuclear stress test which was abnormal he was sent for cardiac catheterization which he did on November 26, 2019 at that time he was found to have 70% ostial lesion in his SVG to PDA and this was treated with a resolute Onyx stent, hypertension, hyperlipidemia, prediabetes, tobacco use and degenerative disc disease.  He is here today for follow-up visit he tells me he feels a lot better.  He did see his vascular surgeon yesterday and they are planning to do his procedure on December 07, 2019.  He has no complaints at this time.   Past Medical History:  Diagnosis Date  . Abnormal stress test   . Atypical chest pain 05/07/2017  . DDD (degenerative disc disease), lumbar 11/08/2019   Formatting of this note might be different from the original. [facility]  . Essential hypertension   . 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 Hyperlipidemia   . Myocardial infarction (HCC)   . PVD (peripheral vascular disease) (HCC) 09/21/2019  . Shortness of breath   . Tobacco use disorder     Past Surgical History:  Procedure Laterality Date  . ABDOMINAL AORTOGRAM W/LOWER EXTREMITY Bilateral 09/21/2019   Procedure: ABDOMINAL AORTOGRAM W/LOWER EXTREMITY;  Surgeon: [name], MD;  Location: [facility] INVASIVE CV LAB;  Service: Cardiovascular;  Laterality: Bilateral;  . CORONARY ARTERY BYPASS GRAFT    .  CORONARY STENT INTERVENTION N/A 11/26/2019   Procedure: CORONARY STENT INTERVENTION;  Surgeon: Yvonne Kendall, MD;  Location: [facility] INVASIVE CV LAB;  Service: Cardiovascular;  Laterality: N/A;  . LEFT HEART CATH AND CORS/GRAFTS ANGIOGRAPHY N/A 11/26/2019   Procedure: LEFT HEART CATH AND CORS/GRAFTS ANGIOGRAPHY;  Surgeon: Yvonne Kendall, MD;  Location: [facility] INVASIVE CV LAB;  Service: Cardiovascular;  Laterality: N/A;    Current Medications: Current Meds  Medication Sig  . aspirin EC 81 MG tablet Take 2 tablets (162 mg total) by mouth daily.  Marland Kitchen atorvastatin (LIPITOR) 80 MG tablet Take 80 mg by mouth daily.  . cilostazol (PLETAL) 100 MG tablet Take 100 mg by mouth daily.   . clopidogrel (PLAVIX) 75 MG tablet Take 1 tablet (75 mg total) by mouth daily.  Marland Kitchen gabapentin (NEURONTIN) 600 MG tablet Take 600 mg by mouth 2 (two) times daily as needed (Pain).   . metoprolol succinate (TOPROL-XL) 25 MG 24 hr tablet Take 12.5 mg by mouth daily.  . nitroGLYCERIN (NITROSTAT) 0.4 MG SL tablet Place 1 tablet (0.4 mg total) under the tongue every 5 (five) minutes as needed for chest pain.  . ranolazine (RANEXA) 500 MG 12 hr tablet Take 1 tablet (500 mg total) by mouth 2 (two) times daily.  Marland Kitchen REPATHA SURECLICK 140 MG/ML SOAJ Inject the contents of 1 pen (140 mg) under the skin every fourteen (14) days.   Current Facility-Administered Medications for the 11/29/19 encounter (Office Visit) with Jayleene Glaeser,  Lavona Mound, DO  Medication  . sodium chloride flush (NS) 0.9 % injection 3 mL     Allergies:   Patient has no known allergies.   Social History   Socioeconomic History  . Marital status: Married    Spouse name: Not on file  . Number of children: Not on file  . Years of education: Not on file  . Highest education level: Not on file  Occupational History  . Not on file  Tobacco Use  . Smoking status: Current Every Day Smoker    Types: Cigars  . Smokeless tobacco: Never Used  Vaping Use  . Vaping Use: Never used   Substance and Sexual Activity  . Alcohol use: No  . Drug use: No  . Sexual activity: Not on file  Other Topics Concern  . Not on file  Social History Narrative  . Not on file   Social Determinants of Health   Financial Resource Strain:   . Difficulty of Paying Living Expenses: Not on file  Food Insecurity:   . Worried About Programme researcher, broadcasting/film/video in the Last Year: Not on file  . Ran Out of Food in the Last Year: Not on file  Transportation Needs:   . Lack of Transportation (Medical): Not on file  . Lack of Transportation (Non-Medical): Not on file  Physical Activity:   . Days of Exercise per Week: Not on file  . Minutes of Exercise per Session: Not on file  Stress:   . Feeling of Stress : Not on file  Social Connections:   . Frequency of Communication with Friends and Family: Not on file  . Frequency of Social Gatherings with Friends and Family: Not on file  . Attends Religious Services: Not on file  . Active Member of Clubs or Organizations: Not on file  . Attends Banker Meetings: Not on file  . Marital Status: Not on file     Family History: The patient's family history includes Heart disease in his father, mother, and sister.  ROS:   Review of Systems  Constitution: Negative for decreased appetite, fever and weight gain.  HENT: Negative for congestion, ear discharge, hoarse voice and sore throat.   Eyes: Negative for discharge, redness, vision loss in right eye and visual halos.  Cardiovascular: Negative for chest pain, dyspnea on exertion, leg swelling, orthopnea and palpitations.  Respiratory: Negative for cough, hemoptysis, shortness of breath and snoring.   Endocrine: Negative for heat intolerance and polyphagia.  Hematologic/Lymphatic: Negative for bleeding problem. Does not bruise/bleed easily.  Skin: Negative for flushing, nail changes, rash and suspicious lesions.  Musculoskeletal: Negative for arthritis, joint pain, muscle cramps, myalgias, neck  pain and stiffness.  Gastrointestinal: Negative for abdominal pain, bowel incontinence, diarrhea and excessive appetite.  Genitourinary: Negative for decreased libido, genital sores and incomplete emptying.  Neurological: Negative for brief paralysis, focal weakness, headaches and loss of balance.  Psychiatric/Behavioral: Negative for altered mental status, depression and suicidal ideas.  Allergic/Immunologic: Negative for HIV exposure and persistent infections.    EKGs/Labs/Other Studies Reviewed:    The following studies were reviewed today:   EKG:  The ekg ordered today demonstrates   LHC Conclusions: 1. Severe native coronary artery disease including 50% ostial LMCA stenosis, chronic total occlusions of the ostial, mid, and distal LAD, 80% stenosis of mid LCx as well as chronic total occlusion of OM1, and chronic total occlusion of the proximal through distal RCA.  There is also a 90% stenosis in the RPDA  distal to the SVG anastomosis. 2. Widely patent LIMA to LAD, supplying a relatively small territory as the distal LAD is chronically occluded. 3. Patent SVG to OM1 with 30% ostial/proximal stenosis. 4. Widely patent SVG to OM 2. 5. Patent SVG to RPDA with 70% ostial/proximal disease as well as 90% stenosis at the distal anastomosis extending into the RPDA. 6. Normal left ventricular filling pressure with mildly to moderately reduced systolic function (LVEF ~45%). 7. Successful PCI to distal anastomosis of SVG to RPDA extending into the RPDA with a Resolute Onyx 2.25 x 18 mm drug-eluting stent (postdilated to 2.9 mm proximally) with 0% residual stenosis and TIMI-3 flow. 8. Successful PCI to ostial/proximal SVG to RPDA using Resolute Onyx 2.75 x 18 mm drug-eluting stent with 0% residual stenosis and TIMI-3 flow.  Recommendations: 1. Continue dual antiplatelet therapy with aspirin and clopidogrel for at least 6 months, ideally longer. 2. Continue aggressive secondary prevention as well  as antianginal therapy with ranolazine. 3. Anticipate same-day discharge this afternoon if no post catheterization complications.  Echocardiogram Study Conclusions  - Left ventricle: The cavity size was normal. There was mild concentric hypertrophy. Systolic function was normal. The estimated ejection fraction was in the range of 50% to 55%. Wall motion was normal; there were no regional wall motion abnormalities. Left ventricular diastolic function parameters were normal.  - Aorta: Aortic root dimension: 43 mm (ED).    Recent Labs: 11/13/2019: BUN 15; Creatinine, Ser 1.00; Hemoglobin 15.3; Platelets 267; Potassium 4.4; Sodium 140  Recent Lipid Panel    Component Value Date/Time   CHOL 191 05/07/2017 1555   TRIG 61 05/07/2017 1555   HDL 27 (L) 05/07/2017 1555   CHOLHDL 7.1 05/07/2017 1555   VLDL 12 05/07/2017 1555   LDLCALC 152 (H) 05/07/2017 1555    Physical Exam:    VS:  BP (!) 140/98   Pulse (!) 59   Ht 5\' 11"  (1.803 m)   Wt 195 lb 6.4 oz (88.6 kg)   SpO2 97%   BMI 27.25 kg/m     Wt Readings from Last 3 Encounters:  11/29/19 195 lb 6.4 oz (88.6 kg)  11/28/19 194 lb (88 kg)  11/26/19 198 lb (89.8 kg)     GEN: Well nourished, well developed in no acute distress HEENT: Normal NECK: No JVD; No carotid bruits LYMPHATICS: No lymphadenopathy CARDIAC: S1S2 noted,RRR, no murmurs, rubs, gallops RESPIRATORY:  Clear to auscultation without rales, wheezing or rhonchi  ABDOMEN: Soft, non-tender, non-distended, +bowel sounds, no guarding. EXTREMITIES: No edema, No cyanosis, no clubbing MUSCULOSKELETAL:  No deformity  SKIN: Warm and dry NEUROLOGIC:  Alert and oriented x 3, non-focal PSYCHIATRIC:  Normal affect, good insight  ASSESSMENT:    1. Essential hypertension   2. PVD (peripheral vascular disease) (HCC)   3. Coronary artery disease involving native coronary artery of native heart without angina pectoris   4. Mixed hyperlipidemia   5. Tobacco use disorder    6. Sinus bradycardia    PLAN:     From a coronary disease standpoint he has no anginal symptoms he is happy because this has improved since his PCI.  We will continue patient's current regimen which includes aspirin, Plavix, Lipitor 80 mgas well as his antianginals.  I also discussed with the patient about start of Repatha.  His vascular procedure is planned for December 07, 2019, he will continue all of his dual antiplatelet therapy throughout this time.  His blood pressure is elevated in the office today.  I am  going to add losartan 25 mg to the patient regimen.  He tells me he does not have any allergy to his medication.  His goal is less than 130/80.  He will continue to take his metoprolol as well.  Tobacco cessation advised.  Sinus bradycardia we will continue current medication regimen no lightheadedness or dizziness.  The patient is in agreement with the above plan. The patient left the office in stable condition.  The patient will follow up in 3 months or sooner if needed.   Medication Adjustments/Labs and Tests Ordered: Current medicines are reviewed at length with the patient today.  Concerns regarding medicines are outlined above.  Orders Placed This Encounter  Procedures  . EKG 12-Lead   Meds ordered this encounter  Medications  . losartan (COZAAR) 25 MG tablet    Sig: Take 1 tablet (25 mg total) by mouth daily.    Dispense:  90 tablet    Refill:  3    Patient Instructions  Medication Instructions:  Start Losartan (Cozaar) 25 mg daily   *If you need a refill on your cardiac medications before your next appointment, please call your pharmacy*   Lab Work: None ordered   If you have labs (blood work) drawn today and your tests are completely normal, you will receive your results only by: Marland Kitchen MyChart Message (if you have MyChart) OR . A paper copy in the mail If you have any lab test that is abnormal or we need to change your treatment, we will call you to review the  results.   Testing/Procedures: None ordered    Follow-Up: At [facility], you and your health needs are our priority.  As part of our continuing mission to provide you with exceptional heart care, we have created designated Provider Care Teams.  These Care Teams include your primary Cardiologist (physician) and Advanced Practice Providers (APPs -  Physician Assistants and Nurse Practitioners) who all work together to provide you with the care you need, when you need it.  We recommend signing up for the patient portal called "MyChart".  Sign up information is provided on this After Visit Summary.  MyChart is used to connect with patients for Virtual Visits (Telemedicine).  Patients are able to view lab/test results, encounter notes, upcoming appointments, etc.  Non-urgent messages can be sent to your provider as well.   To learn more about what you can do with MyChart, go to [email or web address].    Your next appointment:   3 month(s)  The format for your next appointment:   In Person  Provider:   Thomasene Ripple, DO   Other Instructions None      Adopting a Healthy Lifestyle.  Know what a healthy weight is for you (roughly BMI <25) and aim to maintain this   Aim for 7+ servings of fruits and vegetables daily   65-80+ fluid ounces of water or unsweet tea for healthy kidneys   Limit to max 1 drink of alcohol per day; avoid smoking/tobacco   Limit animal fats in diet for cholesterol and heart health - choose grass fed whenever available   Avoid highly processed foods, and foods high in saturated/trans fats   Aim for low stress - take time to unwind and care for your mental health   Aim for 150 min of moderate intensity exercise weekly for heart health, and weights twice weekly for bone health   Aim for 7-9 hours of sleep daily   When it comes to diets, agreement about  the perfect plan isnt easy to find, even among the experts. Experts at the [facility] [employer] developed an idea known as the Healthy Eating Plate. Just imagine a plate divided into logical, healthy portions.   The emphasis is on diet quality:   Load up on vegetables and fruits - one-half of your plate: Aim for color and variety, and remember that potatoes dont count.   Go for whole grains - one-quarter of your plate: Whole wheat, barley, wheat berries, quinoa, oats, brown rice, and foods made with them. If you want pasta, go with whole wheat pasta.   Protein power - one-quarter of your plate: Fish, chicken, beans, and nuts are all healthy, versatile protein sources. Limit red meat.   The diet, however, does go beyond the plate, offering a few other suggestions.   Use healthy plant oils, such as olive, canola, soy, corn, sunflower and peanut. Check the labels, and avoid partially hydrogenated oil, which have unhealthy trans fats.   If youre thirsty, drink water. Coffee and tea are good in moderation, but skip sugary drinks and limit milk and dairy products to one or two daily servings.   The type of carbohydrate in the diet is more important than the amount. Some sources of carbohydrates, such as vegetables, fruits, whole grains, and beans-are healthier than others.   Finally, stay active  Signed, Thomasene Ripple, DO  11/29/2019 1:41 PM    [facility]

## 2019-11-30 ENCOUNTER — Telehealth (HOSPITAL_COMMUNITY): Payer: Self-pay

## 2019-11-30 NOTE — Telephone Encounter (Signed)
Faxed cardiac rehab referral to [place] [facility]. 

## 2019-12-05 ENCOUNTER — Other Ambulatory Visit (HOSPITAL_COMMUNITY)
Admission: RE | Admit: 2019-12-05 | Discharge: 2019-12-05 | Disposition: A | Discharge status: Home / Self Care | Payer: Medicare HMO | Source: Ambulatory Visit | Attending: Vascular Surgery | Admitting: Vascular Surgery

## 2019-12-05 DIAGNOSIS — Z20822 Contact with and (suspected) exposure to covid-19: Secondary | ICD-10-CM | POA: Insufficient documentation

## 2019-12-05 DIAGNOSIS — Z01812 Encounter for preprocedural laboratory examination: Secondary | ICD-10-CM | POA: Insufficient documentation

## 2019-12-05 LAB — SARS CORONAVIRUS 2 (TAT 6-24 HRS): SARS Coronavirus 2: NEGATIVE

## 2019-12-06 ENCOUNTER — Ambulatory Visit: Payer: Medicare HMO | Admitting: Cardiology

## 2019-12-07 ENCOUNTER — Encounter: Payer: Self-pay | Admitting: *Deleted

## 2019-12-07 ENCOUNTER — Encounter (HOSPITAL_COMMUNITY)
Admission: RE | Disposition: A | Discharge status: Home / Self Care | Payer: Self-pay | Source: Home / Self Care | Attending: Vascular Surgery

## 2019-12-07 ENCOUNTER — Other Ambulatory Visit: Payer: Self-pay | Admitting: *Deleted

## 2019-12-07 ENCOUNTER — Ambulatory Visit (HOSPITAL_COMMUNITY)
Admission: RE | Admit: 2019-12-07 | Discharge: 2019-12-07 | Disposition: A | Discharge status: Home / Self Care | Payer: Medicare HMO | Attending: Vascular Surgery | Admitting: Vascular Surgery

## 2019-12-07 DIAGNOSIS — Z951 Presence of aortocoronary bypass graft: Secondary | ICD-10-CM | POA: Insufficient documentation

## 2019-12-07 DIAGNOSIS — I10 Essential (primary) hypertension: Secondary | ICD-10-CM | POA: Insufficient documentation

## 2019-12-07 DIAGNOSIS — Z7982 Long term (current) use of aspirin: Secondary | ICD-10-CM | POA: Insufficient documentation

## 2019-12-07 DIAGNOSIS — Z79899 Other long term (current) drug therapy: Secondary | ICD-10-CM | POA: Diagnosis not present

## 2019-12-07 DIAGNOSIS — I70213 Atherosclerosis of native arteries of extremities with intermittent claudication, bilateral legs: Secondary | ICD-10-CM | POA: Diagnosis present

## 2019-12-07 DIAGNOSIS — I252 Old myocardial infarction: Secondary | ICD-10-CM | POA: Insufficient documentation

## 2019-12-07 DIAGNOSIS — Z7902 Long term (current) use of antithrombotics/antiplatelets: Secondary | ICD-10-CM | POA: Insufficient documentation

## 2019-12-07 DIAGNOSIS — F17290 Nicotine dependence, other tobacco product, uncomplicated: Secondary | ICD-10-CM | POA: Diagnosis not present

## 2019-12-07 DIAGNOSIS — I70223 Atherosclerosis of native arteries of extremities with rest pain, bilateral legs: Secondary | ICD-10-CM | POA: Diagnosis not present

## 2019-12-07 DIAGNOSIS — I739 Peripheral vascular disease, unspecified: Secondary | ICD-10-CM | POA: Diagnosis present

## 2019-12-07 DIAGNOSIS — E785 Hyperlipidemia, unspecified: Secondary | ICD-10-CM | POA: Diagnosis not present

## 2019-12-07 HISTORY — PX: ABDOMINAL AORTOGRAM W/LOWER EXTREMITY: CATH118223

## 2019-12-07 HISTORY — PX: PERIPHERAL VASCULAR BALLOON ANGIOPLASTY: CATH118281

## 2019-12-07 LAB — POCT ACTIVATED CLOTTING TIME
Activated Clotting Time: 257 seconds
Activated Clotting Time: 213 seconds
Activated Clotting Time: 191 seconds
Activated Clotting Time: 175 seconds

## 2019-12-07 LAB — POCT I-STAT, CHEM 8
BUN: 19 mg/dL (ref 6–20)
Calcium, Ion: 1.19 mmol/L (ref 1.15–1.40)
Chloride: 106 mmol/L (ref 98–111)
Creatinine, Ser: 1.2 mg/dL (ref 0.61–1.24)
Glucose, Bld: 98 mg/dL (ref 70–99)
HCT: 40 % (ref 39.0–52.0)
Hemoglobin: 13.6 g/dL (ref 13.0–17.0)
Potassium: 4 mmol/L (ref 3.5–5.1)
Sodium: 142 mmol/L (ref 135–145)
TCO2: 28 mmol/L (ref 22–32)

## 2019-12-07 SURGERY — ABDOMINAL AORTOGRAM W/LOWER EXTREMITY
Anesthesia: LOCAL

## 2019-12-07 MED ORDER — ACETAMINOPHEN 325 MG PO TABS
ORAL_TABLET | ORAL | Status: AC
  Filled 2019-12-07: qty 2

## 2019-12-07 MED ORDER — ONDANSETRON HCL 4 MG/2ML IJ SOLN: 4.0000 mg | Freq: Four times a day (QID) | INTRAMUSCULAR | Status: DC | PRN

## 2019-12-07 MED ORDER — SODIUM CHLORIDE 0.9% FLUSH: 3.0000 mL | INTRAVENOUS | Status: DC | PRN

## 2019-12-07 MED ORDER — SODIUM CHLORIDE 0.9 % IV SOLN: INTRAVENOUS | Status: DC

## 2019-12-07 MED ORDER — OXYCODONE-ACETAMINOPHEN 5-325 MG PO TABS: 1.0000 | ORAL_TABLET | ORAL | 0 refills | Status: DC | PRN

## 2019-12-07 MED ORDER — OXYCODONE-ACETAMINOPHEN 5-325 MG PO TABS: 1.0000 | ORAL_TABLET | ORAL | Status: DC | PRN

## 2019-12-07 MED ORDER — IODIXANOL 320 MG/ML IV SOLN
INTRAVENOUS | Status: DC | PRN
  Administered 2019-12-07: 175 mL via INTRA_ARTERIAL

## 2019-12-07 MED ORDER — FENTANYL CITRATE (PF) 100 MCG/2ML IJ SOLN
INTRAMUSCULAR | Status: DC | PRN
Start: 2019-12-07 — End: 2019-12-07
  Administered 2019-12-07 (×2): 50 ug via INTRAVENOUS

## 2019-12-07 MED ORDER — ACETAMINOPHEN 325 MG PO TABS
650.0000 mg | ORAL_TABLET | ORAL | Status: DC | PRN
  Administered 2019-12-07 (×2): 650 mg via ORAL
  Filled 2019-12-07: qty 2

## 2019-12-07 MED ORDER — HYDRALAZINE HCL 20 MG/ML IJ SOLN: 5.0000 mg | INTRAMUSCULAR | Status: DC | PRN

## 2019-12-07 MED ORDER — SODIUM CHLORIDE 0.9 % IV SOLN: 250.0000 mL | INTRAVENOUS | Status: DC | PRN

## 2019-12-07 MED ORDER — MIDAZOLAM HCL 2 MG/2ML IJ SOLN
INTRAMUSCULAR | Status: AC
  Filled 2019-12-07: qty 2

## 2019-12-07 MED ORDER — LIDOCAINE HCL (PF) 1 % IJ SOLN
INTRAMUSCULAR | Status: DC | PRN
  Administered 2019-12-07: 15 mL via INTRADERMAL

## 2019-12-07 MED ORDER — SODIUM CHLORIDE 0.9 % WEIGHT BASED INFUSION: 1.0000 mL/kg/h | INTRAVENOUS | Status: DC

## 2019-12-07 MED ORDER — HEPARIN SODIUM (PORCINE) 1000 UNIT/ML IJ SOLN
INTRAMUSCULAR | Status: DC | PRN
  Administered 2019-12-07: 9000 [IU] via INTRAVENOUS

## 2019-12-07 MED ORDER — LIDOCAINE HCL (PF) 1 % IJ SOLN
INTRAMUSCULAR | Status: AC
  Filled 2019-12-07: qty 30

## 2019-12-07 MED ORDER — FENTANYL CITRATE (PF) 100 MCG/2ML IJ SOLN
INTRAMUSCULAR | Status: AC
  Filled 2019-12-07: qty 2

## 2019-12-07 MED ORDER — HEPARIN (PORCINE) IN NACL 1000-0.9 UT/500ML-% IV SOLN
INTRAVENOUS | Status: AC
  Filled 2019-12-07: qty 1000

## 2019-12-07 MED ORDER — MIDAZOLAM HCL 2 MG/2ML IJ SOLN
INTRAMUSCULAR | Status: DC | PRN
  Administered 2019-12-07 (×2): 1 mg via INTRAVENOUS

## 2019-12-07 MED ORDER — HEPARIN SODIUM (PORCINE) 1000 UNIT/ML IJ SOLN
INTRAMUSCULAR | Status: AC
  Filled 2019-12-07: qty 1

## 2019-12-07 MED ORDER — HEPARIN (PORCINE) IN NACL 1000-0.9 UT/500ML-% IV SOLN
INTRAVENOUS | Status: DC | PRN
  Administered 2019-12-07 (×2): 500 mL

## 2019-12-07 MED ORDER — LABETALOL HCL 5 MG/ML IV SOLN: 10.0000 mg | INTRAVENOUS | Status: DC | PRN

## 2019-12-07 MED ORDER — SODIUM CHLORIDE 0.9% FLUSH: 3.0000 mL | Freq: Two times a day (BID) | INTRAVENOUS | Status: DC

## 2019-12-07 SURGICAL SUPPLY — 24 items
BALLN IN.PACT DCB 5X80 (BALLOONS) ×3
BALLN MUSTANG 5X60X135 (BALLOONS) ×3
BALLN MUSTANG 5X60X75 (BALLOONS) ×3
BALLOON MUSTANG 5X60X135 (BALLOONS) IMPLANT
BALLOON MUSTANG 5X60X75 (BALLOONS) IMPLANT
CATH ANGIO 5F PIGTAIL 65CM (CATHETERS) ×1 IMPLANT
CATH CROSS OVER TEMPO 5F (CATHETERS) ×1 IMPLANT
CATH STRAIGHT 5FR 65CM (CATHETERS) ×1 IMPLANT
DCB IN.PACT 5X80 (BALLOONS) IMPLANT
DEVICE TORQUE .025-.038 (MISCELLANEOUS) ×1 IMPLANT
GUIDEWIRE ANGLED .035X150CM (WIRE) ×1 IMPLANT
GUIDEWIRE ANGLED .035X260CM (WIRE) ×1 IMPLANT
KIT ENCORE 26 ADVANTAGE (KITS) ×1 IMPLANT
KIT MICROPUNCTURE NIT STIFF (SHEATH) ×1 IMPLANT
KIT PV (KITS) ×3 IMPLANT
SHEATH PINNACLE 5F 10CM (SHEATH) ×1 IMPLANT
SHEATH PINNACLE ST 6F 45CM (SHEATH) ×1 IMPLANT
SHEATH PROBE COVER 6X72 (BAG) ×1 IMPLANT
SYR MEDRAD MARK 7 150ML (SYRINGE) ×3 IMPLANT
TRANSDUCER W/STOPCOCK (MISCELLANEOUS) ×3 IMPLANT
TRAY PV CATH (CUSTOM PROCEDURE TRAY) ×3 IMPLANT
WIRE HI TORQ VERSACORE J 260CM (WIRE) ×1 IMPLANT
WIRE ROSEN-J .035X260CM (WIRE) ×1 IMPLANT
WIRE STARTER BENTSON 035X150 (WIRE) ×1 IMPLANT

## 2019-12-07 NOTE — Discharge Instructions (Signed)
Femoral Site Care This sheet gives you information about how to care for yourself after your procedure. Your health care provider may also give you more specific instructions. If you have problems or questions, contact your health care provider. What can I expect after the procedure? After the procedure, it is common to have:  Bruising that usually fades within 1-2 weeks.  Tenderness at the site. Follow these instructions at home: Wound care  Follow instructions from your health care provider about how to take care of your insertion site. Make sure you: ? Wash your hands with soap and water before you change your bandage (dressing). If soap and water are not available, use hand sanitizer. ? Change your dressing as told by your health care provider. ? Leave stitches (sutures), skin glue, or adhesive strips in place. These skin closures may need to stay in place for 2 weeks or longer. If adhesive strip edges start to loosen and curl up, you may trim the loose edges. Do not remove adhesive strips completely unless your health care provider tells you to do that.  Do not take baths, swim, or use a hot tub until your health care provider approves.  You may shower 24-48 hours after the procedure or as told by your health care provider. ? Gently wash the site with plain soap and water. ? Pat the area dry with a clean towel. ? Do not rub the site. This may cause bleeding.  Do not apply powder or lotion to the site. Keep the site clean and dry.  Check your femoral site every day for signs of infection. Check for: ? Redness, swelling, or pain. ? Fluid or blood. ? Warmth. ? Pus or a bad smell. Activity  For the first 2-3 days after your procedure, or as long as directed: ? Avoid climbing stairs as much as possible. ? Do not squat.  Do not lift anything that is heavier than 10 lb (4.5 kg), or the limit that you are told, until your health care provider says that it is safe.  Rest as  directed. ? Avoid sitting for a long time without moving. Get up to take short walks every 1-2 hours.  Do not drive for 24 hours if you were given a medicine to help you relax (sedative). General instructions  Take over-the-counter and prescription medicines only as told by your health care provider.  Keep all follow-up visits as told by your health care provider. This is important. Contact a health care provider if you have:  A fever or chills.  You have redness, swelling, or pain around your insertion site. Get help right away if:  The catheter insertion area swells very fast.  You pass out.  You suddenly start to sweat or your skin gets clammy.  The catheter insertion area is bleeding, and the bleeding does not stop when you hold steady pressure on the area.  The area near or just beyond the catheter insertion site becomes pale, cool, tingly, or numb. These symptoms may represent a serious problem that is an emergency. Do not wait to see if the symptoms will go away. Get medical help right away. Call your local emergency services (911 in the [place] Do not drive yourself to the hospital. Summary  After the procedure, it is common to have bruising that usually fades within 1-2 weeks.  Check your femoral site every day for signs of infection.  Do not lift anything that is heavier than 10 lb (4.5 kg), or the   limit that you are told, until your health care provider says that it is safe. This information is not intended to replace advice given to you by your health care provider. Make sure you discuss any questions you have with your health care provider. Document Revised: 02/28/2017 Document Reviewed: 02/28/2017 Elsevier Patient Education  2020 [employer]  

## 2019-12-07 NOTE — Interval H&P Note (Signed)
History and Physical Interval Note:  12/07/2019 7:30 AM  Benjamin Espinoza  has presented today for surgery, with the diagnosis of pvd.  The various methods of treatment have been discussed with the patient and family. After consideration of risks, benefits and other options for treatment, the patient has consented to  Procedure(s): ABDOMINAL AORTOGRAM W/LOWER EXTREMITY (N/A) as a surgical intervention.  The patient's history has been reviewed, patient examined, no change in status, stable for surgery.  I have reviewed the patient's chart and labs.  Questions were answered to the patient's satisfaction.     [name]

## 2019-12-07 NOTE — Op Note (Addendum)
PATIENT: Benjamin Espinoza      MRN: [id number] DOB: 1965/10/21    DATE OF PROCEDURE: 12/07/2019  INDICATIONS:    Benjamin Espinoza is a 54 y.o. male who presents with critical limb ischemia bilaterally.  He has rest pain.  He was felt to have an endovascular option on the left.  On the right side he would require a right femoropopliteal bypass with a prosthetic graft.  He is undergone vein mapping which shows no great saphenous vein on either side.  He is undergone PTCA on 11/26/2019 and needs to stay on his Plavix for 6 months at least.  PROCEDURE:    1.  Conscious sedation 2.  Ultrasound-guided access to the right common femoral artery 3.  Aortogram with bilateral iliac arteriogram 4.  Selective catheterization of the left superficial femoral artery (third order catheterization) 5.  Left lower extremity runoff 6.  Drug-coated balloon angioplasty of the left superficial femoral artery 7.  Retrograde right femoral arteriogram  SURGEON: Judeth Cornfield. Scot Dock, MD, FACS  ANESTHESIA: Local with sedation  EBL: Minimal  TECHNIQUE: The patient was brought to the peripheral vascular lab and was sedated. The period of conscious sedation was 90 minutes.  During that time period, I was present face-to-face 100% of the time.  The patient was administered 1 mg of Versed and 50 mcg of fentanyl.  He subsequently received an additional 1 mg of Versed and 50 mcg of fentanyl the patient's heart rate, blood pressure, and oxygen saturation were monitored by the nurse continuously during the procedure.  Both groins were prepped and draped in the usual sterile fashion.  Under ultrasound guidance, after the skin was anesthetized, I cannulated the right common femoral artery with a micropuncture needle and a micropuncture sheath was introduced over a wire.  This was exchanged for a 5 [place] sheath over a Bentson wire.  By ultrasound the femoral artery was patent. A real-time image was obtained and sent to the  server.  A pigtail catheter was positioned at the L1 vertebral body.  Flush aortogram obtained.  The catheter was then positioned above the aortic bifurcation and an oblique iliac projection was obtained in an RAO projection.  Next the pigtail catheter was exchanged for a crossover catheter which was positioned into the left common iliac artery.  A Glidewire was advanced down into the common femoral artery and the crossover catheter advanced into the common femoral artery.  I then exchanged for a Rosen wire.  A long 6 [place] destination sheath was then passed over the bifurcation into the common femoral artery.  Left lower extremity arteriogram was obtained.  I then was able to get a long angled Glidewire through the stenosis in the superficial femoral artery which was 95%.  I exchanged this for a long versa core wire using the balloon catheter which we had selected.  This was a 5 mm x 6 mm Mustang balloon.  Arteriogram was obtained to confirm the position of the stenosis.  There was a focal 95% stenosis with moderate disease below that.  This was addressed with balloon angioplasty.  The balloon was inflated to 10 atm for 1 minute.  Had a good result.  I then went back with a drug-coated balloon.  This was a 5 mm x 8 cm balloon.  This was inflated slowly to 6 atm for 3 minutes.  Follow-up film showed no residual stenosis.  A small AV fistula was noted.  The catheter was then retracted into the right  iliac system and right femoral arteriogram was obtained with right lower extremity runoff.  The patient was then transferred to the holding area for removal of the sheath.  No immediate complications were noted.  FINDINGS:   1.  Single renal arteries bilaterally with no significant renal artery stenosis identified. 2.  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  (He will be scheduled for duplex to rule out an aneurysm) 3.  On the right side, the common iliac and external iliac arteries are patent with no significant  stenosis.  The common femoral and deep femoral artery are patent.  The superficial femoral artery is occluded in the proximal thigh with reconstitution of a diseased above-knee popliteal artery.  The below-knee popliteal artery segment is patent with two-vessel runoff on the right via the peroneal and posterior tibial arteries.  The anterior tibial artery is occluded. 4.  On the left side, the common iliac and external iliac arteries are patent.  The common femoral, deep femoral, and superficial femoral arteries are patent.  There was a 95% focal stenosis just above the adductor canal with some moderate disease over a length of approximately 5 cm below that.  This was successfully addressed with drug-coated balloon angioplasty as described above.  Below that the popliteal artery is patent with single-vessel runoff via the peroneal artery which reconstitutes the posterior tibial and dorsalis pedis in the foot.  CLINICAL NOTE: This patient will need a duplex of his abdominal aorta.  He will be scheduled for a right femoral to below-knee popliteal artery bypass with PTFE.  Of note, this patient was having significant pain and has not been able to establish a pain clinic visit yet.  For this reason I did prescribe him 20 Percocet which was sent to his pharmacy.  PRE: 95% right SFA POST: Less than 10% STENT: No  Deitra Mayo, MD, FACS [facility]  DATE OF DICTATION:   12/07/2019

## 2019-12-07 NOTE — Progress Notes (Signed)
Discharge instructions reviewed with pt and his wife (via telephone) Both voice understanding.  

## 2019-12-07 NOTE — Progress Notes (Signed)
Order for sheath removal verified per post procedural orders. Procedure explained to patient and Rt femoral artery access site assessed: level 0, Doppler posterior tibial pulses. 6French Sheath removed and manual pressure applied for 20 minutes. Pre, peri, & post procedural vitals: HR 60's, RR 10-15, O2 Sat upper 98-100, BP 140-150/90-95, Pain 0. Distal pulses remained intact after sheath removal. Access site level 0 and dressed with 4X4 gauze and Tegaderm applied and post procedural  discussed and return demonstration from patient.

## 2019-12-10 ENCOUNTER — Encounter (HOSPITAL_COMMUNITY): Payer: Self-pay | Admitting: Vascular Surgery

## 2019-12-18 ENCOUNTER — Other Ambulatory Visit: Payer: Self-pay | Admitting: Physician Assistant

## 2019-12-18 ENCOUNTER — Telehealth: Payer: Self-pay

## 2019-12-18 NOTE — Telephone Encounter (Signed)
Pt called requesting refill on pain medications. Stated he was having pain at his angiogram incision site and unable to get to a pain management doctor until November. Reports he was seen by a provider today and advised no hematoma. Advised pt our office is unable to refill his Percocet. He may take OTC Tylenol ES as directed for pain and may alternate cold/heat compress to area for pain relief. Pt voiced understanding.

## 2019-12-19 NOTE — Progress Notes (Signed)
[facility] #[id number] - [place], [place] - [address] AT [facility] [address] [place]  [address] Phone: [phone] Fax: [phone]  [facility] [place], [place] - [address] [place] [address] Phone: [phone] Fax: [phone]      Your procedure is scheduled on October 26  Report to [facility] Main Entrance "A" at 0530 A.M., and check in at the Admitting office.  Call this number if you have problems the morning of surgery:  [phone]  Call [phone] if you have any questions prior to your surgery date Monday-Friday 8am-4pm    Remember:  Do not eat or drink after midnight the night before your surgery    Take these medicines the morning of surgery with A SIP OF WATER  atorvastatin (LIPITOR) cilostazol (PLETAL)  gabapentin (NEURONTIN) metoprolol succinate (TOPROL-XL) oxyCODONE-acetaminophen (PERCOCET) if needed for pain ranolazine (RANEXA)  Follow your surgeon's instructions on when to stop Aspirin and clopidogrel (PLAVIX)  .  If no instructions were given by your surgeon then you will need to call the office to get those instructions.    As of today, STOP taking any Aspirin (unless otherwise instructed by your surgeon) Aleve, Naproxen, Ibuprofen, Motrin, Advil, Goody's, BC's, all herbal medications, fish oil, and all vitamins.                      Do not wear jewelry            Do not wear lotions, powders, colognes, or deodorant.            Men may shave face and neck.            Do not bring valuables to the hospital.            [facility] is not responsible for any belongings or valuables.  Do NOT Smoke (Tobacco/Vaping) or drink Alcohol 24 hours prior to your procedure If you use a CPAP at night, you may bring all equipment for your overnight stay.   Contacts, glasses, dentures or bridgework may not be worn into surgery.      For patients admitted to the hospital, discharge time  will be determined by your treatment team.   Patients discharged the day of surgery will not be allowed to drive home, and someone needs to stay with them for 24 hours.    Special instructions:   - Preparing For Surgery  Before surgery, you can play an important role. Because skin is not sterile, your skin needs to be as free of germs as possible. You can reduce the number of germs on your skin by washing with CHG (chlorahexidine gluconate) Soap before surgery.  CHG is an antiseptic cleaner which kills germs and bonds with the skin to continue killing germs even after washing.    Oral Hygiene is also important to reduce your risk of infection.  Remember - BRUSH YOUR TEETH THE MORNING OF SURGERY WITH YOUR REGULAR TOOTHPASTE  Please do not use if you have an allergy to CHG or antibacterial soaps. If your skin becomes reddened/irritated stop using the CHG.  Do not shave (including legs and underarms) for at least 48 hours prior to first CHG shower. It is OK to shave your face.  Please follow these instructions carefully.   1. Shower the NIGHT BEFORE SURGERY and the MORNING OF SURGERY with CHG Soap.   2. If you chose to  wash your hair, wash your hair first as usual with your normal shampoo.  3. After you shampoo, rinse your hair and body thoroughly to remove the shampoo.  4. Use CHG as you would any other liquid soap. You can apply CHG directly to the skin and wash gently with a scrungie or a clean washcloth.   5. Apply the CHG Soap to your body ONLY FROM THE NECK DOWN.  Do not use on open wounds or open sores. Avoid contact with your eyes, ears, mouth and genitals (private parts). Wash Face and genitals (private parts)  with your normal soap.   6. Wash thoroughly, paying special attention to the area where your surgery will be performed.  7. Thoroughly rinse your body with warm water from the neck down.  8. DO NOT shower/wash with your normal soap after using and rinsing off  the CHG Soap.  9. Pat yourself dry with a CLEAN TOWEL.  10. Wear CLEAN PAJAMAS to bed the night before surgery  11. Place CLEAN SHEETS on your bed the night of your first shower and DO NOT SLEEP WITH PETS.   Day of Surgery: Wear Clean/Comfortable clothing the morning of surgery Do not apply any deodorants/lotions.   Remember to brush your teeth WITH YOUR REGULAR TOOTHPASTE.   Please read over the following fact sheets that you were given.

## 2019-12-20 ENCOUNTER — Encounter (HOSPITAL_COMMUNITY): Payer: Self-pay

## 2019-12-20 ENCOUNTER — Encounter (HOSPITAL_COMMUNITY)
Admission: RE | Admit: 2019-12-20 | Discharge: 2019-12-20 | Disposition: A | Discharge status: Home / Self Care | Payer: Medicare HMO | Source: Ambulatory Visit | Attending: Vascular Surgery | Admitting: Vascular Surgery

## 2019-12-20 ENCOUNTER — Other Ambulatory Visit: Payer: Self-pay

## 2019-12-20 DIAGNOSIS — Z01818 Encounter for other preprocedural examination: Secondary | ICD-10-CM | POA: Diagnosis present

## 2019-12-20 HISTORY — DX: Atherosclerotic heart disease of native coronary artery without angina pectoris: I25.10

## 2019-12-20 HISTORY — DX: Anxiety disorder, unspecified: F41.9

## 2019-12-20 HISTORY — DX: Unspecified asthma, uncomplicated: J45.909

## 2019-12-20 LAB — COMPREHENSIVE METABOLIC PANEL
ALT: 20 U/L (ref 0–44)
AST: 17 U/L (ref 15–41)
Albumin: 3.9 g/dL (ref 3.5–5.0)
Alkaline Phosphatase: 70 U/L (ref 38–126)
Anion gap: 8 (ref 5–15)
BUN: 14 mg/dL (ref 6–20)
CO2: 27 mmol/L (ref 22–32)
Calcium: 10.2 mg/dL (ref 8.9–10.3)
Chloride: 104 mmol/L (ref 98–111)
Creatinine, Ser: 1.18 mg/dL (ref 0.61–1.24)
GFR, Estimated: >60 mL/min (ref >60)
Glucose, Bld: 103 mg/dL — ABNORMAL HIGH (ref 70–99)
Potassium: 4.3 mmol/L (ref 3.5–5.1)
Sodium: 139 mmol/L (ref 135–145)
Total Bilirubin: 0.3 mg/dL (ref 0.3–1.2)
Total Protein: 8.3 g/dL — ABNORMAL HIGH (ref 6.5–8.1)

## 2019-12-20 LAB — TYPE AND SCREEN
ABO/RH(D): O POS
Antibody Screen: NEGATIVE

## 2019-12-20 LAB — URINALYSIS, ROUTINE W REFLEX MICROSCOPIC
Bilirubin Urine: NEGATIVE
Glucose, UA: NEGATIVE mg/dL
Hgb urine dipstick: NEGATIVE
Ketones, ur: NEGATIVE mg/dL
Leukocytes,Ua: NEGATIVE
Nitrite: NEGATIVE
Protein, ur: NEGATIVE mg/dL
Specific Gravity, Urine: 1.019 (ref 1.005–1.030)
pH: 5 (ref 5.0–8.0)

## 2019-12-20 LAB — SURGICAL PCR SCREEN
MRSA, PCR: NEGATIVE
Staphylococcus aureus: NEGATIVE

## 2019-12-20 LAB — CBC
HCT: 42.9 % (ref 39.0–52.0)
Hemoglobin: 14 g/dL (ref 13.0–17.0)
MCH: 31.5 pg (ref 26.0–34.0)
MCHC: 32.6 g/dL (ref 30.0–36.0)
MCV: 96.4 fL (ref 80.0–100.0)
Platelets: 343 10*3/uL (ref 150–400)
RBC: 4.45 MIL/uL (ref 4.22–5.81)
RDW: 14.6 % (ref 11.5–15.5)
WBC: 8.2 10*3/uL (ref 4.0–10.5)
nRBC: 0 % (ref 0.0–0.2)

## 2019-12-20 NOTE — Progress Notes (Addendum)
PCP: Georgette Shell, PA-C Cardiologist: Thomasene Ripple, DO  EKG:11/29/19 CXR: 09/04/19 ECHO: 04/03/19 C.E. Stress Test: 10/16/19 Cardiac Cath: 11/26/19  Pt had not taken BP meds this morning--instructed to take as soon as he arrived back home.  Pt reports surgeon instructed to continue ASA, Last dose Plavix 12/19/19  Covid test scheduled for 12/24/19--aware to self quarantine afterwards  No smoking 24 hours prior to surgery.  Patient denies shortness of breath, fever, cough, and chest pain at PAT appointment.  Patient verbalized understanding of instructions provided today at the PAT appointment.  Patient asked to review instructions at home and day of surgery.

## 2019-12-20 NOTE — Progress Notes (Signed)
Received call from lab--insufficent sample for PT/PTT labs--will need redraw DOS.

## 2019-12-20 NOTE — Progress Notes (Signed)
[facility] DR[facility] STORE #[id number] - [place], [place] - [address] AT [facility] [address] [place] [address] Phone: [phone] Fax: [phone]  [facility] [place], [place] - [address] [place] [address] Phone: [phone] Fax: [phone]      Your procedure is scheduled on October 26  Report to [facility] Main Entrance "A" at 0530 A.M., and check in at the Admitting office.  Call this number if you have problems the morning of surgery:  [phone]  Call [phone] if you have any questions prior to your surgery date Monday-Friday 8am-4pm    Remember:  Do not eat or drink after midnight the night before your surgery    Take these medicines the morning of surgery with A SIP OF WATER  atorvastatin (LIPITOR) cilostazol (PLETAL)  gabapentin (NEURONTIN) metoprolol succinate (TOPROL-XL) oxyCODONE-acetaminophen (PERCOCET) if needed for pain ranolazine (RANEXA) Aspirin  STOP your Plavix per your Doctor's instructions  As of today, STOP taking any Aleve, Naproxen, Ibuprofen, Motrin, Advil, Goody's, BC's, all herbal medications, fish oil, and all vitamins.                     Do not wear jewelry            Do not wear lotions, powders, colognes, or deodorant.            Men may shave face and neck.            Do not bring valuables to the hospital.            [facility] is not responsible for any belongings or valuables.  Do NOT Smoke (Tobacco/Vaping) or drink Alcohol 24 hours prior to your procedure If you use a CPAP at night, you may bring all equipment for your overnight stay.   Contacts, glasses, dentures or bridgework may not be worn into surgery.      For patients admitted to the hospital, discharge time will be determined by your treatment team.   Patients discharged the day of surgery will not be allowed to drive home, and someone needs to stay with them for 24 hours.    Special instructions:    [facility]- Preparing For Surgery  Before surgery, you can play an important role. Because skin is not sterile, your skin needs to be as free of germs as possible. You can reduce the number of germs on your skin by washing with CHG (chlorahexidine gluconate) Soap before surgery.  CHG is an antiseptic cleaner which kills germs and bonds with the skin to continue killing germs even after washing.    Oral Hygiene is also important to reduce your risk of infection.  Remember - BRUSH YOUR TEETH THE MORNING OF SURGERY WITH YOUR REGULAR TOOTHPASTE  Please do not use if you have an allergy to CHG or antibacterial soaps. If your skin becomes reddened/irritated stop using the CHG.  Do not shave (including legs and underarms) for at least 48 hours prior to first CHG shower. It is OK to shave your face.  Please follow these instructions carefully.   1. Shower the NIGHT BEFORE SURGERY and the MORNING OF SURGERY with CHG Soap.   2. If you chose to wash your hair, wash your hair first as usual with your normal shampoo.  3. After you shampoo, rinse your hair and body thoroughly to remove the shampoo.  4. Use CHG as you would any other  liquid soap. You can apply CHG directly to the skin and wash gently with a scrungie or a clean washcloth.   5. Apply the CHG Soap to your body ONLY FROM THE NECK DOWN.  Do not use on open wounds or open sores. Avoid contact with your eyes, ears, mouth and genitals (private parts). Wash Face and genitals (private parts)  with your normal soap.   6. Wash thoroughly, paying special attention to the area where your surgery will be performed.  7. Thoroughly rinse your body with warm water from the neck down.  8. DO NOT shower/wash with your normal soap after using and rinsing off the CHG Soap.  9. Pat yourself dry with a CLEAN TOWEL.  10. Wear CLEAN PAJAMAS to bed the night before surgery  11. Place CLEAN SHEETS on your bed the night of your first shower and DO NOT SLEEP  WITH PETS.   Day of Surgery: Wear Clean/Comfortable clothing the morning of surgery Do not apply any deodorants/lotions.   Remember to brush your teeth WITH YOUR REGULAR TOOTHPASTE.   Please read over the following fact sheets that you were given.

## 2019-12-21 ENCOUNTER — Telehealth: Payer: Self-pay

## 2019-12-21 NOTE — Anesthesia Preprocedure Evaluation (Addendum)
Anesthesia Evaluation  Patient identified by MRN, date of birth, ID band Patient awake    Reviewed: Allergy & Precautions, NPO status , Patient's Chart, lab work & pertinent test results, reviewed documented beta blocker date and time   Airway Mallampati: II  TM Distance: >3 FB Neck ROM: Full    Dental  (+) Dental Advisory Given   Pulmonary asthma , Current Smoker and Patient abstained from smoking.,    breath sounds clear to auscultation       Cardiovascular hypertension, Pt. on medications and Pt. on home beta blockers + CAD, + Past MI, + Cardiac Stents (DES 10/2019), + CABG and + Peripheral Vascular Disease   Rhythm:Regular Rate:Normal  Echo 04/03/19 [facility]): Summary  1. The left ventricle is normal in size with moderately increased wall  thickness.  2. The left ventricular systolic function is normal, LVEF is visually  estimated at > 55%.  3. There is grade I diastolic dysfunction (impaired relaxation).  4. There is mild aortic regurgitation.  5. The aorta at the sinuses of Valsalva is mildly dilated.  6. The right ventricle is normal in size, with normal systolic function.    Neuro/Psych negative neurological ROS     GI/Hepatic negative GI ROS, Neg liver ROS,   Endo/Other  negative endocrine ROS  Renal/GU negative Renal ROS     Musculoskeletal  (+) Arthritis ,   Abdominal   Peds  Hematology negative hematology ROS (+)   Anesthesia Other Findings   Reproductive/Obstetrics                            Lab Results  Component Value Date   WBC 8.2 12/20/2019   HGB 14.0 12/20/2019   HCT 42.9 12/20/2019   MCV 96.4 12/20/2019   PLT 343 12/20/2019   Lab Results  Component Value Date   CREATININE 1.18 12/20/2019   BUN 14 12/20/2019   NA 139 12/20/2019   K 4.3 12/20/2019   CL 104 12/20/2019   CO2 27 12/20/2019    Anesthesia Physical Anesthesia Plan  ASA:  III  Anesthesia Plan: General   Post-op Pain Management:    Induction: Intravenous  PONV Risk Score and Plan: 1 and Dexamethasone, Treatment may vary due to age or medical condition and Ondansetron  Airway Management Planned: Oral ETT  Additional Equipment: Arterial line  Intra-op Plan:   Post-operative Plan: Extubation in OR  Informed Consent: I have reviewed the patients History and Physical, chart, labs and discussed the procedure including the risks, benefits and alternatives for the proposed anesthesia with the patient or authorized representative who has indicated his/her understanding and acceptance.     Dental advisory given  Plan Discussed with: CRNA  Anesthesia Plan Comments: (See PAT note written 12/21/2019 by Shonna Chock, PA-C. DES x2 11/26/19. He is remaining on ASA, Plavix. )     Anesthesia Quick Evaluation

## 2019-12-21 NOTE — Progress Notes (Signed)
Anesthesia Chart Review:  Case: [id number] Date/Time: 12/25/19 0715   Procedure: BYPASS GRAFT FEMORAL- BELOW KNEE POPLITEAL ARTERY RIGHT (Right )   Anesthesia type: General   Pre-op diagnosis: PAD   [facility]: [facility] ROOM 11 / [facility]   Surgeons: [name], MD      DISCUSSION: Patient is a 54 year old male scheduled for the above procedure.  History includes CAD (CABG x 4 in [place], [place] 12/2015: LIMA-LAD, SVG-OM1, SVG-OM2, SVG-PDA; DES SVG-RPDA x2 11/26/19), HTN, HLD, PVD (BLE rest pain; s/p drug-coated angioplasty left SFA 12/07/19), dyspnea, anxiety.  Insufficient sample for PT/PTT with PAT labs, so will need to be redrawn on the day of surgery.   He did not take Plavix on 12/20/19, but after clarifying with [facility]--Patient is to continue ASA and Plavix perioperatively given recent DES. Lucianne Lei, RN has notified patient to resume Plavix on 12/21/19. Okay to continue Pletal as well per [facility].  Preoperative COVID-19 test is scheduled for 12/24/2019. Anesthesia team to evaluate on the day of surgery.   VS: BP (!) 145/94   Pulse 67   Temp 36.6 C (Oral)   Resp 18   Ht 5\' 10"  (1.778 m)   Wt 89.7 kg   SpO2 100%   BMI 28.38 kg/m     PROVIDERS: , PA-C is PCP [facility] Everywhere) [facility], MD is cardiologist. Last visit 11/29/19.   LABS: Labs reviewed: Acceptable for surgery.  He needs PT/PTT on the day of surgery. (all labs ordered are listed, but only abnormal results are displayed)  Labs Reviewed  COMPREHENSIVE METABOLIC PANEL - Abnormal; Notable for the following components:      Result Value   Glucose, Bld 103 (*)    Total Protein 8.3 (*)    All other components within normal limits  SURGICAL PCR SCREEN  CBC  URINALYSIS, ROUTINE W REFLEX MICROSCOPIC  TYPE AND SCREEN     IMAGES: CXR 09/04/19: FINDINGS: Median sternotomy and CABG.The cardiomediastinal contours are normal. The lungs are clear. Pulmonary vasculature is normal.  No consolidation, pleural effusion, or pneumothorax. No acute osseous abnormalities are seen. Bone islands in the right anterior sixth rib, as seen on CT. IMPRESSION: No acute chest findings.   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 Dilated mid ascending thoracic aorta measuring 4 cm. Infrarenal abdominal aorta is dilated measuring 2.5 cm.  - Calcified and noncalcified plaque primarily involving theabdominal aorta and iliac arteries.  - No aortic dissection.  CHEST:  - Left ventricle appears hypertrophied. Post CABG. No pericardial effusion. No mediastinal lymphadenopathy.  - Clear central airways. No consolidation. No pleural effusion.  ABDOMENand PELVIS:  - Bilateral renal hypodense lesions too small to characterize. Otherwise, abdominal and pelvic organs unremarkable.  - No bowel obstruction. No free fluid. No abdominal or pelvic lymphadenopathy.  BONES:  - Unremarkable.  SOFT TISSUES:  - Unremarkable.  IMPRESSION:  - No aortic dissection.   EKG: 11/29/19: Sinus bradycardia at 59 bpm Rightward axis   CV: Cardiac cath 11/26/19: Conclusions: 1. Severe native coronary artery disease including 50% ostial LMCA stenosis, chronic total occlusions of the ostial, mid, and distal LAD, 80% stenosis of mid LCx as well as chronic total occlusion of OM1, and chronic total occlusion of the proximal through distal RCA.  There is also a 90% stenosis in the RPDA distal to the SVG anastomosis. 2. Widely patent LIMA to LAD, supplying a  relatively small territory as the distal LAD is chronically occluded. 3. Patent SVG to OM1 with 30% ostial/proximal stenosis. 4. Widely patent SVG to OM 2. 5. Patent SVG to RPDA with 70% ostial/proximal disease as well as 90% stenosis at the distal anastomosis extending into the RPDA. 6. Normal left ventricular filling pressure with mildly to moderately reduced systolic function  (LVEF ~45%). 7. Successful PCI to distal anastomosis of SVG to RPDA extending into the RPDA with a Resolute Onyx 2.25 x 18 mm drug-eluting stent (postdilated to 2.9 mm proximally) with 0% residual stenosis and TIMI-3 flow. 8. Successful PCI to ostial/proximal SVG to RPDA using Resolute Onyx 2.75 x 18 mm drug-eluting stent with 0% residual stenosis and TIMI-3 flow. Recommendations: 1. Continue dual antiplatelet therapy with aspirin and clopidogrel for at least 6 months, ideally longer. 2. Continue aggressive secondary prevention as well as antianginal therapy with ranolazine. 3. Anticipate same-day discharge this afternoon if no post catheterization complications.   Carotid US 04/04/19 [facility]): Final Interpretation  Right Carotid: Plaque noted in the ECA. There was no evidence of thrombus,         dissection, atherosclerotic plaque or stenosis in the cervical         carotid system.  Left Carotid: The extracranial vessels were near-normal with only minimal wall        thickening or plaque.  Vertebrals: Both vertebral arteries were patent with antegrade flow.  Subclavians: Normal flow hemodynamics were seen in bilateral subclavian        arteries.    Echo 04/03/19 [facility]): Summary  1. The left ventricle is normal in size with moderately increased wall  thickness.  2. The left ventricular systolic function is normal, LVEF is visually  estimated at > 55%.  3. There is grade I diastolic dysfunction (impaired relaxation).  4. There is mild aortic regurgitation.  5. The aorta at the sinuses of Valsalva is mildly dilated.  6. The right ventricle is normal in size, with normal systolic function.    Past Medical History:  Diagnosis Date  . Abnormal stress test   . Anxiety   . Asthma    as a child  . Atypical chest pain 05/07/2017  . Coronary artery disease   . DDD (degenerative disc disease), lumbar 11/08/2019   Formatting of this note might be  different from the original. [facility] [place]  . Essential hypertension   . 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 Hyperlipidemia   . Myocardial infarction (HCC) 2017  . PVD (peripheral vascular disease) (HCC) 09/21/2019  . Shortness of breath   . Tobacco use disorder     Past Surgical History:  Procedure Laterality Date  . ABDOMINAL AORTOGRAM W/LOWER EXTREMITY Bilateral 09/21/2019   Procedure: ABDOMINAL AORTOGRAM W/LOWER EXTREMITY;  Surgeon: [name], MD;  [facility]: [facility] INVASIVE CV LAB;  Service: Cardiovascular;  Laterality: Bilateral;  . ABDOMINAL AORTOGRAM W/LOWER EXTREMITY N/A 12/07/2019   Procedure: ABDOMINAL AORTOGRAM W/LOWER EXTREMITY;  Surgeon: [name], MD;  Location: [facility] INVASIVE CV LAB;  Service: Cardiovascular;  Laterality: N/A;  . CARDIAC CATHETERIZATION    . CORONARY ARTERY BYPASS GRAFT     quadruple bypas 2017  . CORONARY STENT INTERVENTION N/A 11/26/2019   Procedure: CORONARY STENT INTERVENTION;  Surgeon: Yvonne Kendall, MD;  Location: [facility] INVAS[facility] CV LAB;  Service: Cardiovascular;  Laterality: N/A;  . LEFT HEART CATH AND CORS/GRAFTS ANGIOGRAPHY N/A 11/26/2019   Procedure: LEFT HEART CATH AND CORS/GRAFTS ANGIOGRAPHY;  Surgeon: Yvonne Kendall,  MD;  Location: [facility] INVASIVE CV LAB;  Service: Cardiovascular;  Laterality: N/A;  . PERIPHERAL VASCULAR BALLOON ANGIOPLASTY Left 12/07/2019   Procedure: PERIPHERAL VASCULAR BALLOON ANGIOPLASTY;  Surgeon: [name], MD;  Location: [facility] INVASIVE CV LAB;  Service: Cardiovascular;  Laterality: Left;  SFA    MEDICATIONS: . aspirin EC 81 MG tablet  . atorvastatin (LIPITOR) 80 MG tablet  . cilostazol (PLETAL) 100 MG tablet  . clopidogrel (PLAVIX) 75 MG tablet  . gabapentin (NEURONTIN) 600 MG tablet  . losartan (COZAAR) 25 MG tablet  . metoprolol succinate (TOPROL-XL) 25 MG 24 hr tablet  . nitroGLYCERIN (NITROSTAT) 0.4 MG SL tablet  . oxyCODONE-acetaminophen (PERCOCET) 5-325 MG tablet  . ranolazine  (RANEXA) 500 MG 12 hr tablet  . REPATHA SURECLICK 140 MG/ML SOAJ   . sodium chloride flush (NS) 0.9 % injection 3 mL    Shonna Chock, PA-C Surgical Short Stay/Anesthesiology [name] Phone [phone] [facility] Phone [phone] 12/21/2019 12:58 PM

## 2019-12-21 NOTE — Telephone Encounter (Signed)
Pt scheduled for a right FPBG on 12/25/19. He is s/p DES x 2 on 11/26/19. Pt stopped Plavix on 10/20 for upcoming procedure. Advised pt to resume taking Plavix today and continue taking daily along with ASA. Pt voiced understanding.

## 2019-12-24 ENCOUNTER — Other Ambulatory Visit (HOSPITAL_COMMUNITY)
Admit: 2019-12-24 | Discharge: 2019-12-24 | Disposition: A | Discharge status: Home / Self Care | Payer: Medicare HMO | Source: Home / Self Care | Attending: Vascular Surgery | Admitting: Vascular Surgery

## 2019-12-24 DIAGNOSIS — Z20822 Contact with and (suspected) exposure to covid-19: Secondary | ICD-10-CM | POA: Insufficient documentation

## 2019-12-24 DIAGNOSIS — Z01812 Encounter for preprocedural laboratory examination: Secondary | ICD-10-CM | POA: Insufficient documentation

## 2019-12-24 LAB — SARS CORONAVIRUS 2 (TAT 6-24 HRS): SARS Coronavirus 2: NEGATIVE

## 2019-12-25 ENCOUNTER — Encounter (HOSPITAL_COMMUNITY): Payer: Self-pay | Admitting: Vascular Surgery

## 2019-12-25 ENCOUNTER — Inpatient Hospital Stay (HOSPITAL_COMMUNITY): Payer: Medicare HMO | Admitting: Anesthesiology

## 2019-12-25 ENCOUNTER — Other Ambulatory Visit: Payer: Self-pay

## 2019-12-25 ENCOUNTER — Encounter (HOSPITAL_COMMUNITY)
Admission: RE | Disposition: A | Discharge status: Home / Self Care | Payer: Self-pay | Source: Home / Self Care | Attending: Vascular Surgery

## 2019-12-25 ENCOUNTER — Inpatient Hospital Stay (HOSPITAL_COMMUNITY): Payer: Medicare HMO

## 2019-12-25 ENCOUNTER — Inpatient Hospital Stay (HOSPITAL_COMMUNITY): Payer: Medicare HMO | Admitting: Vascular Surgery

## 2019-12-25 ENCOUNTER — Inpatient Hospital Stay (HOSPITAL_COMMUNITY)
Admission: RE | Admit: 2019-12-25 | Discharge: 2019-12-26 | DRG: 254 | Disposition: A | Discharge status: Home / Self Care | Payer: Medicare HMO | Attending: Vascular Surgery | Admitting: Vascular Surgery

## 2019-12-25 DIAGNOSIS — I70228 Atherosclerosis of native arteries of extremities with rest pain, other extremity: Secondary | ICD-10-CM | POA: Diagnosis present

## 2019-12-25 DIAGNOSIS — Z20822 Contact with and (suspected) exposure to covid-19: Secondary | ICD-10-CM | POA: Diagnosis present

## 2019-12-25 DIAGNOSIS — I252 Old myocardial infarction: Secondary | ICD-10-CM

## 2019-12-25 DIAGNOSIS — Z7982 Long term (current) use of aspirin: Secondary | ICD-10-CM

## 2019-12-25 DIAGNOSIS — E785 Hyperlipidemia, unspecified: Secondary | ICD-10-CM | POA: Diagnosis present

## 2019-12-25 DIAGNOSIS — I509 Heart failure, unspecified: Secondary | ICD-10-CM | POA: Diagnosis present

## 2019-12-25 DIAGNOSIS — Z951 Presence of aortocoronary bypass graft: Secondary | ICD-10-CM | POA: Diagnosis not present

## 2019-12-25 DIAGNOSIS — F17210 Nicotine dependence, cigarettes, uncomplicated: Secondary | ICD-10-CM | POA: Diagnosis present

## 2019-12-25 DIAGNOSIS — Z419 Encounter for procedure for purposes other than remedying health state, unspecified: Principal | ICD-10-CM

## 2019-12-25 DIAGNOSIS — I110 Hypertensive heart disease with heart failure: Secondary | ICD-10-CM | POA: Diagnosis present

## 2019-12-25 DIAGNOSIS — I739 Peripheral vascular disease, unspecified: Secondary | ICD-10-CM | POA: Diagnosis not present

## 2019-12-25 DIAGNOSIS — Z7902 Long term (current) use of antithrombotics/antiplatelets: Secondary | ICD-10-CM | POA: Diagnosis not present

## 2019-12-25 DIAGNOSIS — Z9861 Coronary angioplasty status: Secondary | ICD-10-CM | POA: Diagnosis not present

## 2019-12-25 DIAGNOSIS — Z79899 Other long term (current) drug therapy: Secondary | ICD-10-CM

## 2019-12-25 DIAGNOSIS — M5136 Other intervertebral disc degeneration, lumbar region: Secondary | ICD-10-CM | POA: Diagnosis present

## 2019-12-25 DIAGNOSIS — I70221 Atherosclerosis of native arteries of extremities with rest pain, right leg: Secondary | ICD-10-CM

## 2019-12-25 DIAGNOSIS — I70229 Atherosclerosis of native arteries of extremities with rest pain, unspecified extremity: Secondary | ICD-10-CM | POA: Diagnosis present

## 2019-12-25 HISTORY — PX: LOWER EXTREMITY ANGIOGRAM: SHX5508

## 2019-12-25 HISTORY — PX: FEMORAL-POPLITEAL BYPASS GRAFT: SHX937

## 2019-12-25 LAB — CBC
HCT: 37.5 % — ABNORMAL LOW (ref 39.0–52.0)
Hemoglobin: 12.2 g/dL — ABNORMAL LOW (ref 13.0–17.0)
MCH: 30.6 pg (ref 26.0–34.0)
MCHC: 32.5 g/dL (ref 30.0–36.0)
MCV: 94 fL (ref 80.0–100.0)
Platelets: 294 10*3/uL (ref 150–400)
RBC: 3.99 MIL/uL — ABNORMAL LOW (ref 4.22–5.81)
RDW: 15 % (ref 11.5–15.5)
WBC: 9.3 10*3/uL (ref 4.0–10.5)
nRBC: 0 % (ref 0.0–0.2)

## 2019-12-25 LAB — PROTIME-INR
INR: 1 (ref 0.8–1.2)
Prothrombin Time: 12.4 seconds (ref 11.4–15.2)

## 2019-12-25 LAB — APTT: aPTT: 28 seconds (ref 24–36)

## 2019-12-25 LAB — CREATININE, SERUM
Creatinine, Ser: 1.14 mg/dL (ref 0.61–1.24)
GFR, Estimated: >60 mL/min (ref >60)

## 2019-12-25 LAB — ABO/RH: ABO/RH(D): O POS

## 2019-12-25 SURGERY — BYPASS GRAFT FEMORAL-POPLITEAL ARTERY
Anesthesia: General | Site: Leg Upper | Laterality: Right

## 2019-12-25 MED ORDER — ROCURONIUM BROMIDE 10 MG/ML (PF) SYRINGE
PREFILLED_SYRINGE | INTRAVENOUS | Status: DC | PRN
  Administered 2019-12-25: 30 mg via INTRAVENOUS
  Administered 2019-12-25 (×2): 50 mg via INTRAVENOUS

## 2019-12-25 MED ORDER — FENTANYL CITRATE (PF) 100 MCG/2ML IJ SOLN
25.0000 ug | INTRAMUSCULAR | Status: DC | PRN
  Administered 2019-12-25 (×3): 25 ug via INTRAVENOUS

## 2019-12-25 MED ORDER — SODIUM CHLORIDE 0.9 % IV SOLN: INTRAVENOUS | Status: DC | PRN

## 2019-12-25 MED ORDER — OXYCODONE-ACETAMINOPHEN 5-325 MG PO TABS
1.0000 | ORAL_TABLET | ORAL | Status: DC | PRN
  Administered 2019-12-25: 1 via ORAL
  Administered 2019-12-25 – 2019-12-26 (×4): 2 via ORAL
  Filled 2019-12-25 (×5): qty 2

## 2019-12-25 MED ORDER — METOPROLOL TARTRATE 5 MG/5ML IV SOLN: 2.0000 mg | INTRAVENOUS | Status: DC | PRN

## 2019-12-25 MED ORDER — IODIXANOL 320 MG/ML IV SOLN
INTRAVENOUS | Status: DC | PRN
  Administered 2019-12-25: 15 mL via INTRA_ARTERIAL

## 2019-12-25 MED ORDER — DOCUSATE SODIUM 100 MG PO CAPS
100.0000 mg | ORAL_CAPSULE | Freq: Every day | ORAL | Status: DC
  Administered 2019-12-26: 100 mg via ORAL
  Filled 2019-12-25: qty 1

## 2019-12-25 MED ORDER — ACETAMINOPHEN 325 MG PO TABS: 325.0000 mg | ORAL_TABLET | ORAL | Status: DC | PRN

## 2019-12-25 MED ORDER — FENTANYL CITRATE (PF) 250 MCG/5ML IJ SOLN
INTRAMUSCULAR | Status: DC | PRN
  Administered 2019-12-25: 50 ug via INTRAVENOUS
  Administered 2019-12-25: 100 ug via INTRAVENOUS
  Administered 2019-12-25 (×2): 50 ug via INTRAVENOUS

## 2019-12-25 MED ORDER — ONDANSETRON HCL 4 MG/2ML IJ SOLN
INTRAMUSCULAR | Status: AC
  Filled 2019-12-25: qty 2

## 2019-12-25 MED ORDER — ASPIRIN EC 81 MG PO TBEC
162.0000 mg | DELAYED_RELEASE_TABLET | Freq: Every day | ORAL | Status: DC
  Administered 2019-12-25 – 2019-12-26 (×2): 162 mg via ORAL
  Filled 2019-12-25: qty 2

## 2019-12-25 MED ORDER — ONDANSETRON HCL 4 MG/2ML IJ SOLN
4.0000 mg | Freq: Four times a day (QID) | INTRAMUSCULAR | Status: DC | PRN
  Administered 2019-12-25 – 2019-12-26 (×3): 4 mg via INTRAVENOUS
  Filled 2019-12-25 (×3): qty 2

## 2019-12-25 MED ORDER — GABAPENTIN 600 MG PO TABS
600.0000 mg | ORAL_TABLET | Freq: Two times a day (BID) | ORAL | Status: DC | PRN
  Administered 2019-12-26: 600 mg via ORAL
  Filled 2019-12-25: qty 1

## 2019-12-25 MED ORDER — ALUM & MAG HYDROXIDE-SIMETH 200-200-20 MG/5ML PO SUSP: 15.0000 mL | ORAL | Status: DC | PRN

## 2019-12-25 MED ORDER — NICOTINE 14 MG/24HR TD PT24
14.0000 mg | MEDICATED_PATCH | Freq: Every day | TRANSDERMAL | Status: DC
  Administered 2019-12-25 – 2019-12-26 (×2): 14 mg via TRANSDERMAL
  Filled 2019-12-25 (×2): qty 1

## 2019-12-25 MED ORDER — HYDRALAZINE HCL 20 MG/ML IJ SOLN: 5.0000 mg | INTRAMUSCULAR | Status: DC | PRN

## 2019-12-25 MED ORDER — LIDOCAINE 2% (20 MG/ML) 5 ML SYRINGE
INTRAMUSCULAR | Status: AC
  Filled 2019-12-25: qty 5

## 2019-12-25 MED ORDER — SODIUM CHLORIDE 0.9 % IV SOLN: 500.0000 mL | Freq: Once | INTRAVENOUS | Status: DC | PRN

## 2019-12-25 MED ORDER — ONDANSETRON HCL 4 MG/2ML IJ SOLN
INTRAMUSCULAR | Status: DC | PRN
  Administered 2019-12-25: 4 mg via INTRAVENOUS

## 2019-12-25 MED ORDER — PHENYLEPHRINE HCL-NACL 10-0.9 MG/250ML-% IV SOLN
INTRAVENOUS | Status: DC | PRN
  Administered 2019-12-25: 20 ug/min via INTRAVENOUS

## 2019-12-25 MED ORDER — HYDROMORPHONE HCL 1 MG/ML IJ SOLN
INTRAMUSCULAR | Status: DC | PRN
Start: 2019-12-25 — End: 2019-12-25
  Administered 2019-12-25 (×2): .5 mg via INTRAVENOUS

## 2019-12-25 MED ORDER — HYDROMORPHONE HCL 1 MG/ML IJ SOLN
INTRAMUSCULAR | Status: AC
  Filled 2019-12-25: qty 0.5

## 2019-12-25 MED ORDER — SUGAMMADEX SODIUM 200 MG/2ML IV SOLN
INTRAVENOUS | Status: DC | PRN
  Administered 2019-12-25: 200 mg via INTRAVENOUS

## 2019-12-25 MED ORDER — ATORVASTATIN CALCIUM 80 MG PO TABS
80.0000 mg | ORAL_TABLET | Freq: Every day | ORAL | Status: DC
  Administered 2019-12-25 – 2019-12-26 (×2): 80 mg via ORAL
  Filled 2019-12-25 (×2): qty 1

## 2019-12-25 MED ORDER — ACETAMINOPHEN 650 MG RE SUPP: 325.0000 mg | RECTAL | Status: DC | PRN

## 2019-12-25 MED ORDER — PROTAMINE SULFATE 10 MG/ML IV SOLN
INTRAVENOUS | Status: DC | PRN
  Administered 2019-12-25: 50 mg via INTRAVENOUS

## 2019-12-25 MED ORDER — PANTOPRAZOLE SODIUM 40 MG PO TBEC
40.0000 mg | DELAYED_RELEASE_TABLET | Freq: Every day | ORAL | Status: DC
  Administered 2019-12-25 – 2019-12-26 (×2): 40 mg via ORAL
  Filled 2019-12-25 (×2): qty 1

## 2019-12-25 MED ORDER — MAGNESIUM SULFATE 2 GM/50ML IV SOLN: 2.0000 g | Freq: Every day | INTRAVENOUS | Status: DC | PRN

## 2019-12-25 MED ORDER — CLOPIDOGREL BISULFATE 75 MG PO TABS
75.0000 mg | ORAL_TABLET | Freq: Every day | ORAL | Status: DC
  Administered 2019-12-26: 75 mg via ORAL
  Filled 2019-12-25: qty 1

## 2019-12-25 MED ORDER — CILOSTAZOL 50 MG PO TABS
100.0000 mg | ORAL_TABLET | Freq: Every day | ORAL | Status: DC
  Administered 2019-12-25 – 2019-12-26 (×2): 100 mg via ORAL
  Filled 2019-12-25 (×2): qty 2

## 2019-12-25 MED ORDER — ORAL CARE MOUTH RINSE: 15.0000 mL | Freq: Once | OROMUCOSAL | Status: AC

## 2019-12-25 MED ORDER — 0.9 % SODIUM CHLORIDE (POUR BTL) OPTIME
TOPICAL | Status: DC | PRN
  Administered 2019-12-25 (×2): 1000 mL

## 2019-12-25 MED ORDER — CHLORHEXIDINE GLUCONATE 0.12 % MT SOLN
OROMUCOSAL | Status: AC
  Administered 2019-12-25: 15 mL via OROMUCOSAL
  Filled 2019-12-25: qty 15

## 2019-12-25 MED ORDER — RANOLAZINE ER 500 MG PO TB12
500.0000 mg | ORAL_TABLET | Freq: Two times a day (BID) | ORAL | Status: DC
  Administered 2019-12-25 – 2019-12-26 (×3): 500 mg via ORAL
  Filled 2019-12-25 (×3): qty 1

## 2019-12-25 MED ORDER — GUAIFENESIN-DM 100-10 MG/5ML PO SYRP: 15.0000 mL | ORAL_SOLUTION | ORAL | Status: DC | PRN

## 2019-12-25 MED ORDER — PHENOL 1.4 % MT LIQD: 1.0000 | OROMUCOSAL | Status: DC | PRN

## 2019-12-25 MED ORDER — LACTATED RINGERS IV SOLN: INTRAVENOUS | Status: DC

## 2019-12-25 MED ORDER — METOPROLOL SUCCINATE ER 25 MG PO TB24
12.5000 mg | ORAL_TABLET | Freq: Every day | ORAL | Status: DC
  Administered 2019-12-25 – 2019-12-26 (×2): 12.5 mg via ORAL
  Filled 2019-12-25 (×2): qty 1

## 2019-12-25 MED ORDER — LACTATED RINGERS IV SOLN: INTRAVENOUS | Status: DC | PRN

## 2019-12-25 MED ORDER — MIDAZOLAM HCL 5 MG/5ML IJ SOLN
INTRAMUSCULAR | Status: DC | PRN
  Administered 2019-12-25: 2 mg via INTRAVENOUS

## 2019-12-25 MED ORDER — ROCURONIUM BROMIDE 10 MG/ML (PF) SYRINGE
PREFILLED_SYRINGE | INTRAVENOUS | Status: AC
  Filled 2019-12-25: qty 10

## 2019-12-25 MED ORDER — THROMBIN (RECOMBINANT) 20000 UNITS EX SOLR
CUTANEOUS | Status: AC
  Filled 2019-12-25: qty 20000

## 2019-12-25 MED ORDER — CEFAZOLIN SODIUM-DEXTROSE 2-4 GM/100ML-% IV SOLN
2.0000 g | INTRAVENOUS | Status: AC
  Administered 2019-12-25: 2 g via INTRAVENOUS
  Filled 2019-12-25: qty 100

## 2019-12-25 MED ORDER — SENNOSIDES-DOCUSATE SODIUM 8.6-50 MG PO TABS: 1.0000 | ORAL_TABLET | Freq: Every evening | ORAL | Status: DC | PRN

## 2019-12-25 MED ORDER — PROPOFOL 10 MG/ML IV BOLUS
INTRAVENOUS | Status: AC
  Filled 2019-12-25: qty 20

## 2019-12-25 MED ORDER — SODIUM CHLORIDE 0.9 % IV SOLN: INTRAVENOUS | Status: DC

## 2019-12-25 MED ORDER — PROPOFOL 10 MG/ML IV BOLUS
INTRAVENOUS | Status: DC | PRN
  Administered 2019-12-25: 160 mg via INTRAVENOUS

## 2019-12-25 MED ORDER — SODIUM CHLORIDE (PF) 0.9 % IJ SOLN: INTRAVENOUS | Status: DC | PRN

## 2019-12-25 MED ORDER — CEFAZOLIN SODIUM-DEXTROSE 2-4 GM/100ML-% IV SOLN
2.0000 g | Freq: Three times a day (TID) | INTRAVENOUS | Status: AC
  Administered 2019-12-25 (×2): 2 g via INTRAVENOUS
  Filled 2019-12-25 (×2): qty 100

## 2019-12-25 MED ORDER — POTASSIUM CHLORIDE CRYS ER 20 MEQ PO TBCR: 20.0000 meq | EXTENDED_RELEASE_TABLET | Freq: Every day | ORAL | Status: DC | PRN

## 2019-12-25 MED ORDER — DEXAMETHASONE SODIUM PHOSPHATE 10 MG/ML IJ SOLN
INTRAMUSCULAR | Status: DC | PRN
  Administered 2019-12-25: 4 mg via INTRAVENOUS

## 2019-12-25 MED ORDER — HEMOSTATIC AGENTS (NO CHARGE) OPTIME
TOPICAL | Status: DC | PRN
  Administered 2019-12-25: 1 via TOPICAL

## 2019-12-25 MED ORDER — DEXAMETHASONE SODIUM PHOSPHATE 10 MG/ML IJ SOLN
INTRAMUSCULAR | Status: AC
  Filled 2019-12-25: qty 1

## 2019-12-25 MED ORDER — HYDROMORPHONE HCL 1 MG/ML IJ SOLN: 0.5000 mg | INTRAMUSCULAR | Status: DC | PRN

## 2019-12-25 MED ORDER — MIDAZOLAM HCL 2 MG/2ML IJ SOLN
INTRAMUSCULAR | Status: AC
  Filled 2019-12-25: qty 2

## 2019-12-25 MED ORDER — FENTANYL CITRATE (PF) 100 MCG/2ML IJ SOLN
INTRAMUSCULAR | Status: AC
  Filled 2019-12-25: qty 2

## 2019-12-25 MED ORDER — BISACODYL 5 MG PO TBEC: 5.0000 mg | DELAYED_RELEASE_TABLET | Freq: Every day | ORAL | Status: DC | PRN

## 2019-12-25 MED ORDER — LOSARTAN POTASSIUM 25 MG PO TABS
25.0000 mg | ORAL_TABLET | Freq: Every day | ORAL | Status: DC
  Administered 2019-12-25 – 2019-12-26 (×2): 25 mg via ORAL
  Filled 2019-12-25 (×2): qty 1

## 2019-12-25 MED ORDER — HEPARIN SODIUM (PORCINE) 5000 UNIT/ML IJ SOLN
5000.0000 [IU] | Freq: Three times a day (TID) | INTRAMUSCULAR | Status: DC
  Administered 2019-12-26: 5000 [IU] via SUBCUTANEOUS
  Filled 2019-12-25: qty 1

## 2019-12-25 MED ORDER — LIDOCAINE 2% (20 MG/ML) 5 ML SYRINGE
INTRAMUSCULAR | Status: DC | PRN
  Administered 2019-12-25: 60 mg via INTRAVENOUS

## 2019-12-25 MED ORDER — SODIUM CHLORIDE 0.9 % IV SOLN
INTRAVENOUS | Status: AC
  Filled 2019-12-25: qty 1.2

## 2019-12-25 MED ORDER — CHLORHEXIDINE GLUCONATE CLOTH 2 % EX PADS: 6.0000 | MEDICATED_PAD | Freq: Once | CUTANEOUS | Status: DC

## 2019-12-25 MED ORDER — LABETALOL HCL 5 MG/ML IV SOLN: 10.0000 mg | INTRAVENOUS | Status: DC | PRN

## 2019-12-25 MED ORDER — ACETAMINOPHEN 500 MG PO TABS
1000.0000 mg | ORAL_TABLET | Freq: Once | ORAL | Status: AC
  Administered 2019-12-25: 1000 mg via ORAL
  Filled 2019-12-25: qty 2

## 2019-12-25 MED ORDER — AMISULPRIDE (ANTIEMETIC) 5 MG/2ML IV SOLN: 10.0000 mg | Freq: Once | INTRAVENOUS | Status: DC | PRN

## 2019-12-25 MED ORDER — HEPARIN SODIUM (PORCINE) 1000 UNIT/ML IJ SOLN
INTRAMUSCULAR | Status: DC | PRN
  Administered 2019-12-25: 9000 [IU] via INTRAVENOUS

## 2019-12-25 MED ORDER — CHLORHEXIDINE GLUCONATE 0.12 % MT SOLN: 15.0000 mL | Freq: Once | OROMUCOSAL | Status: AC

## 2019-12-25 MED ORDER — FENTANYL CITRATE (PF) 250 MCG/5ML IJ SOLN
INTRAMUSCULAR | Status: AC
  Filled 2019-12-25: qty 5

## 2019-12-25 MED ORDER — PHENYLEPHRINE 40 MCG/ML (10ML) SYRINGE FOR IV PUSH (FOR BLOOD PRESSURE SUPPORT)
PREFILLED_SYRINGE | INTRAVENOUS | Status: DC | PRN
  Administered 2019-12-25: 80 ug via INTRAVENOUS

## 2019-12-25 SURGICAL SUPPLY — 72 items
ADH SKN CLS APL DERMABOND .7 (GAUZE/BANDAGES/DRESSINGS) ×2
AGENT HMST 10 BLLW SHRT CANN (HEMOSTASIS) ×2
BANDAGE ESMARK 6X9 LF (GAUZE/BANDAGES/DRESSINGS) IMPLANT
BNDG CMPR 9X6 STRL LF SNTH (GAUZE/BANDAGES/DRESSINGS) ×2
BNDG ELASTIC 4X5.8 VLCR STR LF (GAUZE/BANDAGES/DRESSINGS) ×2 IMPLANT
BNDG ESMARK 6X9 LF (GAUZE/BANDAGES/DRESSINGS) ×4
CANISTER SUCT 3000ML PPV (MISCELLANEOUS) ×4 IMPLANT
CANNULA VESSEL 3MM 2 BLNT TIP (CANNULA) ×8 IMPLANT
CLIP VESOCCLUDE MED 24/CT (CLIP) ×4 IMPLANT
CLIP VESOCCLUDE SM WIDE 24/CT (CLIP) ×4 IMPLANT
COVER PROBE W GEL 5X96 (DRAPES) IMPLANT
COVER SURGICAL LIGHT HANDLE (MISCELLANEOUS) ×2 IMPLANT
COVER WAND RF STERILE (DRAPES) ×2 IMPLANT
CUFF TOURN SGL QUICK 24 (TOURNIQUET CUFF) ×4
CUFF TOURN SGL QUICK 34 (TOURNIQUET CUFF)
CUFF TOURN SGL QUICK 42 (TOURNIQUET CUFF) IMPLANT
CUFF TRNQT CYL 24X4X16.5-23 (TOURNIQUET CUFF) IMPLANT
CUFF TRNQT CYL 34X4.125X (TOURNIQUET CUFF) IMPLANT
DERMABOND ADVANCED (GAUZE/BANDAGES/DRESSINGS) ×2
DERMABOND ADVANCED .7 DNX12 (GAUZE/BANDAGES/DRESSINGS) ×2 IMPLANT
DRAIN CHANNEL 15F RND FF W/TCR (WOUND CARE) IMPLANT
DRAPE HALF SHEET 40X57 (DRAPES) IMPLANT
DRAPE X-RAY CASS 24X20 (DRAPES) ×2 IMPLANT
ELECT REM PT RETURN 9FT ADLT (ELECTROSURGICAL) ×4
ELECTRODE REM PT RTRN 9FT ADLT (ELECTROSURGICAL) ×2 IMPLANT
EVACUATOR SILICONE 100CC (DRAIN) IMPLANT
GAUZE 4X4 16PLY RFD (DISPOSABLE) ×2 IMPLANT
GLOVE BIO SURGEON STRL SZ7.5 (GLOVE) ×4 IMPLANT
GLOVE BIOGEL PI IND STRL 8 (GLOVE) ×2 IMPLANT
GLOVE BIOGEL PI INDICATOR 8 (GLOVE) ×4
GLOVE ECLIPSE 6.5 STRL STRAW (GLOVE) ×4 IMPLANT
GLOVE SS BIOGEL STRL SZ 7.5 (GLOVE) IMPLANT
GLOVE SUPERSENSE BIOGEL SZ 7.5 (GLOVE) ×2
GLOVE SURG SYN 8.0 (GLOVE) ×4 IMPLANT
GLOVE SURG SYN 8.0 PF PI (GLOVE) IMPLANT
GOWN STRL REUS W/ TWL LRG LVL3 (GOWN DISPOSABLE) ×6 IMPLANT
GOWN STRL REUS W/ TWL XL LVL3 (GOWN DISPOSABLE) IMPLANT
GOWN STRL REUS W/TWL LRG LVL3 (GOWN DISPOSABLE) ×12
GOWN STRL REUS W/TWL XL LVL3 (GOWN DISPOSABLE) ×4
GRAFT PROPATEN W/RING 6X80X60 (Vascular Products) ×2 IMPLANT
HEMOSTAT HEMOBLAST BELLOWS (HEMOSTASIS) ×2 IMPLANT
INSERT FOGARTY SM (MISCELLANEOUS) ×4 IMPLANT
KIT BASIN OR (CUSTOM PROCEDURE TRAY) ×4 IMPLANT
KIT TURNOVER KIT B (KITS) ×4 IMPLANT
MARKER GRAFT CORONARY BYPASS (MISCELLANEOUS) IMPLANT
NS IRRIG 1000ML POUR BTL (IV SOLUTION) ×8 IMPLANT
PACK PERIPHERAL VASCULAR (CUSTOM PROCEDURE TRAY) ×4 IMPLANT
PAD ARMBOARD 7.5X6 YLW CONV (MISCELLANEOUS) ×8 IMPLANT
SET COLLECT BLD 21X3/4 12 (NEEDLE) IMPLANT
SET COLLECT BLD 21X3/4 12 PB (MISCELLANEOUS) ×2 IMPLANT
SPONGE LAP 18X18 RF (DISPOSABLE) ×2 IMPLANT
SPONGE SURGIFOAM ABS GEL 100 (HEMOSTASIS) IMPLANT
STOPCOCK 4 WAY LG BORE MALE ST (IV SETS) ×2 IMPLANT
SUT ETHILON 3 0 PS 1 (SUTURE) IMPLANT
SUT PROLENE 5 0 C 1 24 (SUTURE) ×4 IMPLANT
SUT PROLENE 6 0 BV (SUTURE) ×6 IMPLANT
SUT PROLENE 6 0 CC (SUTURE) ×10 IMPLANT
SUT SILK 2 0 SH (SUTURE) ×4 IMPLANT
SUT SILK 3 0 (SUTURE) ×4
SUT SILK 3-0 18XBRD TIE 12 (SUTURE) IMPLANT
SUT VIC AB 2-0 CTB1 (SUTURE) ×8 IMPLANT
SUT VIC AB 3-0 SH 27 (SUTURE) ×8
SUT VIC AB 3-0 SH 27X BRD (SUTURE) ×4 IMPLANT
SUT VIC AB 4-0 PS2 18 (SUTURE) ×4 IMPLANT
SUT VICRYL 4-0 PS2 18IN ABS (SUTURE) ×4 IMPLANT
SYR 10ML LL (SYRINGE) ×2 IMPLANT
SYR 20ML LL LF (SYRINGE) ×2 IMPLANT
TOWEL GREEN STERILE (TOWEL DISPOSABLE) ×4 IMPLANT
TRAY FOLEY MTR SLVR 16FR STAT (SET/KITS/TRAYS/PACK) ×4 IMPLANT
TUBING EXTENTION W/L.L. (IV SETS) ×2 IMPLANT
UNDERPAD 30X36 HEAVY ABSORB (UNDERPADS AND DIAPERS) ×4 IMPLANT
WATER STERILE IRR 1000ML POUR (IV SOLUTION) ×4 IMPLANT

## 2019-12-25 NOTE — Anesthesia Procedure Notes (Addendum)
Procedure Name: Intubation Date/Time: 12/25/2019 7:35 AM Performed by: Amadeo Garnet, CRNA Pre-anesthesia Checklist: Patient identified, Emergency Drugs available, Suction available and Patient being monitored Patient Re-evaluated:Patient Re-evaluated prior to induction Oxygen Delivery Method: Circle system utilized Preoxygenation: Pre-oxygenation with 100% oxygen Induction Type: IV induction Ventilation: Mask ventilation without difficulty Laryngoscope Size: Mac and 4 Grade View: Grade I Tube type: Oral Tube size: 7.5 mm Number of attempts: 1 Airway Equipment and Method: Stylet Placement Confirmation: ETT inserted through vocal cords under direct vision,  positive ETCO2 and breath sounds checked- equal and bilateral Secured at: 22 cm Tube secured with: Tape Dental Injury: Teeth and Oropharynx as per pre-operative assessment

## 2019-12-25 NOTE — Transfer of Care (Signed)
Immediate Anesthesia Transfer of Care Note  Patient: Benjamin Espinoza  Procedure(s) Performed: BYPASS GRAFT FEMORAL- BELOW KNEE POPLITEAL ARTERY RIGHT USING GORE PROPATEN VASCULAR GRAFT (Right Leg Upper) RIGHT LOWER EXTREMITY ANGIOGRAM (Right Leg Lower)  Patient Location: PACU  Anesthesia Type:General  Level of Consciousness: awake, alert  and oriented  Airway & Oxygen Therapy: Patient Spontanous Breathing and Patient connected to face mask oxygen  Post-op Assessment: Report given to RN, Post -op Vital signs reviewed and stable and Patient moving all extremities  Post vital signs: Reviewed and stable  Last Vitals:  Vitals Value Taken Time  BP 149/94 12/25/19 1034  Temp    Pulse 76 12/25/19 1037  Resp 12 12/25/19 1037  SpO2 100 % 12/25/19 1037  Vitals shown include unvalidated device data.  Last Pain:  Vitals:   12/25/19 0634  TempSrc:   PainSc: 8       Patients Stated Pain Goal: 3 (12/25/19 4142)  Complications: No complications documented.

## 2019-12-25 NOTE — Interval H&P Note (Signed)
History and Physical Interval Note:  12/25/2019 7:17 AM  Benjamin Espinoza  has presented today for surgery, with the diagnosis of PAD.  The various methods of treatment have been discussed with the patient and family. After consideration of risks, benefits and other options for treatment, the patient has consented to  Procedure(s): BYPASS GRAFT FEMORAL- BELOW KNEE POPLITEAL ARTERY RIGHT (Right) as a surgical intervention.  The patient's history has been reviewed, patient examined, no change in status, stable for surgery.  I have reviewed the patient's chart and labs.  Questions were answered to the patient's satisfaction.     [name]

## 2019-12-25 NOTE — Progress Notes (Addendum)
  Progress Note    12/25/2019 4:18 PM Day of Surgery  Subjective:  Says leg feels tight to move and most of pain is in right calf   Vitals:   12/25/19 1500 12/25/19 1534  BP: (!) 137/93 (!) 141/90  Pulse:  67  Resp: 10 16  Temp:  97.8 F (36.6 C)  SpO2: 96% 97%   Physical Exam: Cardiac:  regular Lungs: non labored Incisions:  Right groin incision is clean, dry and intact. No swelling or hematoma. ACE wrap to below knee popliteal incision Extremities:  2+ femoral pulses, brisk doppler right PT. Right foot warm. Motor and sensation intact Neurologic: alert and oriented  CBC    Component Value Date/Time   WBC 9.3 12/25/2019 1236   RBC 3.99 (L) 12/25/2019 1236   HGB 12.2 (L) 12/25/2019 1236   HGB 15.3 11/13/2019 1359   HCT 37.5 (L) 12/25/2019 1236   HCT 44.5 11/13/2019 1359   PLT 294 12/25/2019 1236   PLT 267 11/13/2019 1359   MCV 94.0 12/25/2019 1236   MCV 91 11/13/2019 1359   MCH 30.6 12/25/2019 1236   MCHC 32.5 12/25/2019 1236   RDW 15.0 12/25/2019 1236   RDW 13.6 11/13/2019 1359    BMET    Component Value Date/Time   NA 139 12/20/2019 0832   NA 140 11/13/2019 1359   K 4.3 12/20/2019 0832   CL 104 12/20/2019 0832   CO2 27 12/20/2019 0832   GLUCOSE 103 (H) 12/20/2019 0832   BUN 14 12/20/2019 0832   BUN 15 11/13/2019 1359   CREATININE 1.14 12/25/2019 1236   CALCIUM 10.2 12/20/2019 0832   GFRNONAA >60 12/25/2019 1236   GFRAA 99 11/13/2019 1359    INR    Component Value Date/Time   INR 1.0 12/25/2019 0658     Intake/Output Summary (Last 24 hours) at 12/25/2019 1618 Last data filed at 12/25/2019 1130 Gross per 24 hour  Intake 1500 ml  Output 805 ml  Net 695 ml     Assessment/Plan:  54 y.o. male is s/p Right femoral to below-knee popliteal artery bypass with PTFE graft Intraoperative arteriogram Day of Surgery. Doing well post op. RLE well perfused with brisk Doppler PT. Pain overall well controlled. Some post op nausea. Has not yet eaten.  Refused mobility due to nausea. Encouraged mobilization later today/ tomorrow morning. If he tolerates well may be able to discharge tomorrow afternoon. I have ordered nicotine patch as well per patients request   Dory Horn [facility] [phone] 12/25/2019 4:18 PM   I have interviewed the patient and examined the patient. I agree with the findings by the PA.  The right foot is hyperemic.  Cari Caraway, MD [phone]

## 2019-12-25 NOTE — Discharge Instructions (Signed)
 [facility] [place]  Discharge instructions  Lower Extremity Bypass Surgery  Please refer to the following instruction for your post-procedure care. Your surgeon or physician assistant will discuss any changes with you.  Activity  You are encouraged to walk as much as you can. You can slowly return to normal activities during the month after your surgery. Avoid strenuous activity and heavy lifting until your doctor tells you it's OK. Avoid activities such as vacuuming or swinging a golf club. Do not drive until your doctor give the OK and you are no longer taking prescription pain medications. It is also normal to have difficulty with sleep habits, eating and bowel movement after surgery. These will go away with time.  Bathing/Showering  Shower daily after you go home. Do not soak in a bathtub, hot tub, or swim until the incision heals completely.  Incision Care  Clean your incision with mild soap and water. Shower every day. Pat the area dry with a clean towel. You do not need a bandage unless otherwise instructed. Do not apply any ointments or creams to your incision. If you have open wounds you will be instructed how to care for them or a visiting nurse may be arranged for you. If you have staples or sutures along your incision they will be removed at your post-op appointment. You may have skin glue on your incision. Do not peel it off. It will come off on its own in about one week.  Wash the groin wound with soap and water daily and pat dry. (No tub bath-only shower)  Then put a dry gauze or washcloth in the groin to keep this area dry to help prevent wound infection.  Do this daily and as needed.  Do not use Vaseline or neosporin on your incisions.  Only use soap and water on your incisions and then protect and keep dry.  Diet  Resume your normal diet. There are no special food restrictions following this procedure. A low fat/ low cholesterol diet is  recommended for all patients with vascular disease. In order to heal from your surgery, it is CRITICAL to get adequate nutrition. Your body requires vitamins, minerals, and protein. Vegetables are the best source of vitamins and minerals. Vegetables also provide the perfect balance of protein. Processed food has little nutritional value, so try to avoid this.  Medications  Resume taking all your medications unless your doctor or physician assistant tells you not to. If your incision is causing pain, you may take over-the-counter pain relievers such as acetaminophen (Tylenol). If you were prescribed a stronger pain medication, please aware these medication can cause nausea and constipation. Prevent nausea by taking the medication with a snack or meal. Avoid constipation by drinking plenty of fluids and eating foods with high amount of fiber, such as fruits, vegetables, and grains. Take Colace 100 mg (an over-the-counter stool softener) twice a day as needed for constipation.  Do not take Tylenol if you are taking prescription pain medications.  Follow Up  Our office will schedule a follow up appointment 2-3 weeks following discharge.  Please call us immediately for any of the following conditions  Severe or worsening pain in your legs or feet while at rest or while walking Increase pain, redness, warmth, or drainage (pus) from your incision site(s) Fever of 101 degree or higher The swelling in your leg with the bypass suddenly worsens and becomes more painful than when you were in the hospital If you have   been instructed to feel your graft pulse then you should do so every day. If you can no longer feel this pulse, call the office immediately. Not all patients are given this instruction.  Leg swelling is common after leg bypass surgery.  The swelling should improve over a few months following surgery. To improve the swelling, you may elevate your legs above the level of your heart while you are  sitting or resting. Your surgeon or physician assistant may ask you to apply an ACE wrap or wear compression (TED) stockings to help to reduce swelling.  Reduce your risk of vascular disease  Stop smoking. If you would like help call [facility] at 1-800-QUIT-NOW (1-800-784-8669) or [place] at 336-586-4000.  Manage your cholesterol Maintain a desired weight Control your diabetes weight Control your diabetes Keep your blood pressure down  If you have any questions, please call the office at [phone]  

## 2019-12-25 NOTE — Progress Notes (Signed)
Mobility Specialist - Progress Note   12/25/19 1430  Mobility  Activity Refused mobility    Pt refused mobility due to nausea. Will f/u as able.   Benjamin Espinoza Mobility Specialist Mobility Specialist Phone: [phone]

## 2019-12-25 NOTE — Anesthesia Procedure Notes (Signed)
Arterial Line Insertion Start/End10/26/2021 7:00 AM, 12/25/2019 7:10 AM Performed by: Lanell Matar, CRNA, CRNA  Patient location: Pre-op. Preanesthetic checklist: patient identified, IV checked, site marked, risks and benefits discussed, surgical consent, monitors and equipment checked, pre-op evaluation, timeout performed and anesthesia consent Lidocaine 1% used for infiltration and patient sedated Right, radial was placed Catheter size: 20 G Hand hygiene performed  and maximum sterile barriers used   Attempts: 1 Procedure performed without using ultrasound guided technique. Following insertion, dressing applied and Biopatch. Post procedure assessment: normal  Patient tolerated the procedure well with no immediate complications.

## 2019-12-25 NOTE — Op Note (Signed)
NAME: Benjamin Espinoza    MRN: [id number] DOB: Dec 09, 1965    DATE OF OPERATION: 12/25/2019  PREOP DIAGNOSIS:    Critical limb ischemia right lower extremity with infrainguinal arterial occlusive disease  POSTOP DIAGNOSIS:    Same  PROCEDURE:    Right femoral to below-knee popliteal artery bypass with PTFE graft Intraoperative arteriogram  SURGEON: Di Kindle. [name], MD  ASSIST: Gretta Began MD, Graceann Congress, [place]  ANESTHESIA: General  EBL: Minimal  INDICATIONS:    Benjamin Espinoza is a 54 y.o. male who presented with rest pain of the right foot and was found to have severe infrainguinal arterial occlusive disease.  FINDINGS:   Anterior tibial and posterior tibial signal at the completion of the procedure.  Completion arteriogram showed no technical problems.  Peroneal and posterior tibial runoff  TECHNIQUE:   The patient was taken to the operating room and received a general anesthetic.  The right leg was prepped and draped in usual sterile fashion.  The vein had been previously taken from both legs.  A longitudinal incision was made in the right groin and through this incision the common femoral, superficial femoral, and deep femoral arteries were dissected free.  It was a high bifurcation of the common femoral artery.  However the proximal superficial femoral artery was soft.  Next incision was made below the knee and the dissection carried down to the below-knee popliteal artery which was somewhat adherent to the popliteal vein.  A tunnel was created between the 2 incisions, and a 6 mm PTFE graft with external rings was passed between the tunnel.  The patient was then heparinized.  At the proximal anastomosis the superficial femoral, common femoral, and deep femoral arteries were controlled.  A longitudinal arteriotomy was made on the superficial femoral artery extending onto the common femoral artery  The graft was sewn into side to the artery using continuous 6-0  Prolene suture.  Prior to completing this anastomosis the arteries were backbled and flushed appropriately and the anastomosis completed.  The graft was clamped.  A tourniquet was then placed on the thigh.  The leg was exsanguinated with an Esmarch bandage.  The tourniquet was inflated to 250 mmHg.  Under tourniquet control a longitudinal arteriotomy was made in the below-knee popliteal artery.  The artery was somewhat thick.  The distal rings were removed and the graft cut to the appropriate length spatulated and sewn end-to-side to the below-knee popliteal artery using a continuous 6-0 Prolene suture.  At the completion there was an excellent signal distal to the anastomosis.  A completion arteriogram was obtained by cannulating the proximal graft and this showed no technical problems.  There was two-vessel runoff via the peroneal and posterior tibial arteries.  Hemostasis was obtained in the incisions.  The groin incision was closed with a deep layer of 2-0 Vicryl, a subcutaneous layer with 3-0 Vicryl, and the skin closed with 4-0 Vicryl.  The below the knee incision was closed with a deep layer of 2-0 Vicryl, a subcutaneous layer with 3-0 Vicryl, and the skin closed with 4-0 Vicryl.  Dermabond was applied.  Patient tolerated the procedure well was transferred to recovery room in stable condition.  All needle and sponge counts were correct.   Given the complexity of the case a first assistant was necessary in order to expedient the procedure and safely perform the technical aspects of the operation.  [name], MD, FACS [facility]  DATE OF DICTATION:  12/25/2019 ° ° °

## 2019-12-25 NOTE — Anesthesia Postprocedure Evaluation (Signed)
Anesthesia Post Note  Patient: Benjamin Espinoza  Procedure(s) Performed: BYPASS GRAFT FEMORAL- BELOW KNEE POPLITEAL ARTERY RIGHT USING GORE PROPATEN VASCULAR GRAFT (Right Leg Upper) RIGHT LOWER EXTREMITY ANGIOGRAM (Right Leg Lower)     Patient location during evaluation: PACU Anesthesia Type: General Level of consciousness: awake and alert Pain management: pain level controlled Vital Signs Assessment: post-procedure vital signs reviewed and stable Respiratory status: spontaneous breathing, nonlabored ventilation, respiratory function stable and patient connected to nasal cannula oxygen Cardiovascular status: blood pressure returned to baseline and stable Postop Assessment: no apparent nausea or vomiting Anesthetic complications: no   No complications documented.  Last Vitals:  Vitals:   12/25/19 1500 12/25/19 1534  BP: (!) 137/93 (!) 141/90  Pulse:  67  Resp: 10 16  Temp:  36.6 C  SpO2: 96% 97%    Last Pain:  Vitals:   12/25/19 1534  TempSrc: Oral  PainSc:                  Kennieth Rad

## 2019-12-26 ENCOUNTER — Inpatient Hospital Stay (HOSPITAL_COMMUNITY): Payer: Medicare HMO

## 2019-12-26 ENCOUNTER — Other Ambulatory Visit (HOSPITAL_COMMUNITY): Payer: Self-pay | Admitting: Physician Assistant

## 2019-12-26 ENCOUNTER — Encounter (HOSPITAL_COMMUNITY): Payer: Self-pay | Admitting: Vascular Surgery

## 2019-12-26 DIAGNOSIS — I739 Peripheral vascular disease, unspecified: Secondary | ICD-10-CM | POA: Diagnosis not present

## 2019-12-26 LAB — BASIC METABOLIC PANEL
Anion gap: 6 (ref 5–15)
BUN: 11 mg/dL (ref 6–20)
CO2: 25 mmol/L (ref 22–32)
Calcium: 9.3 mg/dL (ref 8.9–10.3)
Chloride: 105 mmol/L (ref 98–111)
Creatinine, Ser: 1.04 mg/dL (ref 0.61–1.24)
GFR, Estimated: >60 mL/min (ref >60)
Glucose, Bld: 107 mg/dL — ABNORMAL HIGH (ref 70–99)
Potassium: 3.9 mmol/L (ref 3.5–5.1)
Sodium: 136 mmol/L (ref 135–145)

## 2019-12-26 LAB — CBC
HCT: 37.6 % — ABNORMAL LOW (ref 39.0–52.0)
Hemoglobin: 12.4 g/dL — ABNORMAL LOW (ref 13.0–17.0)
MCH: 31.2 pg (ref 26.0–34.0)
MCHC: 33 g/dL (ref 30.0–36.0)
MCV: 94.7 fL (ref 80.0–100.0)
Platelets: 296 10*3/uL (ref 150–400)
RBC: 3.97 MIL/uL — ABNORMAL LOW (ref 4.22–5.81)
RDW: 14.6 % (ref 11.5–15.5)
WBC: 8.9 10*3/uL (ref 4.0–10.5)
nRBC: 0 % (ref 0.0–0.2)

## 2019-12-26 LAB — LIPID PANEL
Cholesterol: 128 mg/dL (ref 0–200)
HDL: 31 mg/dL — ABNORMAL LOW (ref >40)
LDL Cholesterol: 85 mg/dL (ref 0–99)
Total CHOL/HDL Ratio: 4.1 RATIO
Triglycerides: 62 mg/dL (ref <150)
VLDL: 12 mg/dL (ref 0–40)

## 2019-12-26 MED ORDER — OXYCODONE-ACETAMINOPHEN 5-325 MG PO TABS
1.0000 | ORAL_TABLET | Freq: Four times a day (QID) | ORAL | 0 refills | Status: DC | PRN
Start: 2019-12-26 — End: 2019-12-26

## 2019-12-26 MED FILL — OXYCODONE-APAP 5-325MG: 5-325 | 7 days supply | Qty: 30 | Fill #0

## 2019-12-26 NOTE — Progress Notes (Signed)
   VASCULAR SURGERY ASSESSMENT & PLAN:   POD 1 - RIGHT FEM-BK-POP: Patient is doing well postop.  He has brisk Doppler signals in the right foot.  I have discussed with him the importance of tobacco cessation, exercise, and nutrition.  VASCULAR QUALITY INITIATIVE: He is on aspirin and is on a statin.  He is also on Plavix which he should continue.  DVT PROPHYLAXIS: He is on subcu heparin.  Home today if he is able to ambulate and his pain is controlled on po meds.    SUBJECTIVE:   No specific complaints.  He wants to go home today.  PHYSICAL EXAM:   Vitals:   12/25/19 1900 12/25/19 1935 12/25/19 2300 12/26/19 0410  BP: 136/90 (!) 130/100 (!) 138/92 139/86  Pulse:   70   Resp: 12 17 12 17   Temp:  98.2 F (36.8 C) 98.1 F (36.7 C) 98.2 F (36.8 C)  TempSrc:  Oral Oral Oral  SpO2: 97% 98% 99% 98%  Weight:      Height:       His incisions look fine. Brisk posterior tibial and anterior tibial signal with the Doppler.  LABS:   Lab Results  Component Value Date   WBC 8.9 12/26/2019   HGB 12.4 (L) 12/26/2019   HCT 37.6 (L) 12/26/2019   MCV 94.7 12/26/2019   PLT 296 12/26/2019   Lab Results  Component Value Date   CREATININE 1.04 12/26/2019   Lab Results  Component Value Date   INR 1.0 12/25/2019    PROBLEM LIST:    Active Problems:   Peripheral arterial disease (HCC)   CURRENT MEDS:   . aspirin EC  162 mg Oral Daily  . atorvastatin  80 mg Oral Daily  . cilostazol  100 mg Oral Daily  . clopidogrel  75 mg Oral Daily  . docusate sodium  100 mg Oral Daily  . heparin  5,000 Units Subcutaneous Q8H  . losartan  25 mg Oral Daily  . metoprolol succinate  12.5 mg Oral Daily  . nicotine  14 mg Transdermal Daily  . pantoprazole  40 mg Oral Daily  . ranolazine  500 mg Oral BID    12/27/2019 Office: [name] 12/26/2019

## 2019-12-26 NOTE — Progress Notes (Signed)
PHARMACIST LIPID MONITORING   Benjamin Espinoza is a 54 y.o. male admitted on 12/25/2019 with PVD.  Pharmacy has been consulted to optimize lipid-lowering therapy with the indication of secondary prevention for clinical ASCVD.  Recent Labs:  Lipid Panel (last 6 months):   Lab Results  Component Value Date   CHOL 128 12/26/2019   TRIG 62 12/26/2019   HDL 31 (L) 12/26/2019   CHOLHDL 4.1 12/26/2019   VLDL 12 12/26/2019   LDLCALC 85 12/26/2019    Hepatic function panel (last 6 months):   Lab Results  Component Value Date   AST 17 12/20/2019   ALT 20 12/20/2019   ALKPHOS 70 12/20/2019   BILITOT 0.3 12/20/2019    SCr (since admission):   Serum creatinine: 1.04 mg/dL 77/93/90 3009 Estimated creatinine clearance: 91.5 mL/min  Current lipid-lowering therapy: atorvastatin 80mg  (increased in 10/2019), Repatha (started 04/2019 per patient) Previous lipid-lowering therapies (if applicable): atorvastatin 10mg  Documented or reported allergies or intolerances to lipid-lowering therapies (if applicable): none  Assessment:  Patient agrees with changes to lipid-lowering therapy  Recommendation per protocol:  Refer to lipid clinic for consideration of PCSK9i, Nexletol/Nexlizet, or clinical trial.  Follow-up with:  Cardiology provider - 04/2019, DO  Follow-up labs after discharge:    Liver function panel and lipid panel in 8-12 weeks then annually  Plan: -Will not add zetia at this time -Refer to lipid clinic  Thomasene Ripple, PharmD Clinical Pharmacist **Pharmacist phone directory can now be found on [email or web address] ([email or web address]).  Listed under [facility].

## 2019-12-26 NOTE — Evaluation (Signed)
Physical Therapy Evaluation Patient Details Name: Benjamin Espinoza MRN: [id number] DOB: [birth year] Today's Date: 12/26/2019   History of Present Illness  53 y.o. male seen in the ED  09/04/2019 with right lower extremity claudication which he had for several months. Pt countinued to have severe B LE claudication R>L limiting his ablility to walk. 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  Clinical Impression  PTA pt living in single story apartment with 3 steps to enter and HHA assist 12 hr/7day/wk. Pt utilized [facility] for limited community ambulation and HHA assist with bathing, dressing, and cooking and cleaning. Pt is currently limited in safe mobility by 10/10 pain in R LE and decreased endurance. Pt is min guard for transfers and ambulation of 20 feet with RW. PT educated pt in need for hourly ambulation/exercise to decrease pain and improve endurance. Pt will not need further PT at discharge and is expecting to d/c home this morning. PT will follow acutely if d/c does not occur today.     Follow Up Recommendations No PT follow up;Supervision for mobility/OOB    Equipment Recommendations  Rolling walker with 5" wheels       Precautions / Restrictions Precautions Precautions: Fall Restrictions Weight Bearing Restrictions: No      Mobility  Bed Mobility               General bed mobility comments: OOB in recliner on entry     Transfers Overall transfer level: Needs assistance Equipment used: Rolling walker (2 wheeled) Transfers: Sit to/from Stand Sit to Stand: Min guard         General transfer comment: min guard for safety, vc for hand placement for power up and steadying in RW  Ambulation/Gait Ambulation/Gait assistance: Min guard Gait Distance (Feet): 20 Feet Assistive device: Rolling walker (2  wheeled) Gait Pattern/deviations: Step-to pattern;Step-through pattern;Decreased stance time - right;Decreased weight shift to right Gait velocity: slowed Gait velocity interpretation: <1.31 ft/sec, indicative of household ambulator General Gait Details: min guard for safety, able to progress from step to to step through gait pattern, vc for increased heel strike        Balance Overall balance assessment: Mild deficits observed, not formally tested                                           Pertinent Vitals/Pain Pain Assessment: 0-10 Pain Score: 10-Worst pain ever Pain Location: groin incision  Pain Descriptors / Indicators: Sharp;Shooting Pain Intervention(s): Limited activity within patient's tolerance;Monitored during session;Repositioned    Home Living Family/patient expects to be discharged to:: Private residence Living Arrangements: Alone Available Help at Discharge: Personal care attendant (12hr/7day/wk) Type of Home: Apartment Home Access: Stairs to enter Entrance Stairs-Rails: Can reach both Entrance Stairs-Number of Steps: 3 Home Layout: One level Home Equipment: Cane - single point      Prior Function Level of Independence: Needs assistance   Gait / Transfers Assistance Needed: uses cane for limited community ambulation   ADL's / Homemaking Assistance Needed: aide assist with bathing and dressing, online grocery shopping, aide does cooking and cleaning        Hand Dominance   Dominant Hand: Right    Extremity/Trunk Assessment   Upper Extremity Assessment Upper Extremity Assessment: Overall WFL for  tasks assessed    Lower Extremity Assessment Lower Extremity Assessment: RLE deficits/detail RLE Deficits / Details: ROM WFL RLE: Unable to fully assess due to pain RLE Sensation: decreased light touch RLE Coordination: decreased fine motor       Communication   Communication: No difficulties  Cognition Arousal/Alertness:  Awake/alert Behavior During Therapy: WFL for tasks assessed/performed Overall Cognitive Status: Within Functional Limits for tasks assessed                                        General Comments General comments (skin integrity, edema, etc.): inscision sites with minor edema, no drainage    Exercises General Exercises - Lower Extremity Ankle Circles/Pumps: AROM;Both;10 reps;Seated Long Arc Quad: AROM;Both;10 reps;Seated   Assessment/Plan    PT Assessment Patient needs continued PT services  PT Problem List Pain;Decreased activity tolerance       PT Treatment Interventions DME instruction;Gait training;Stair training;Functional mobility training;Therapeutic activities;Therapeutic exercise;Balance training;Cognitive remediation;Patient/family education    PT Goals (Current goals can be found in the Care Plan section)  Acute Rehab PT Goals Patient Stated Goal: have less pain  PT Goal Formulation: With patient Time For Goal Achievement: 01/09/20 Potential to Achieve Goals: Good    Frequency Min 3X/week    AM-PAC PT "6 Clicks" Mobility  Outcome Measure Help needed turning from your back to your side while in a flat bed without using bedrails?: None Help needed moving from lying on your back to sitting on the side of a flat bed without using bedrails?: None Help needed moving to and from a bed to a chair (including a wheelchair)?: None Help needed standing up from a chair using your arms (e.g., wheelchair or bedside chair)?: None Help needed to walk in hospital room?: None Help needed climbing 3-5 steps with a railing? : A Little 6 Click Score: 23    End of Session Equipment Utilized During Treatment: Gait belt Activity Tolerance: Patient limited by pain Patient left: in chair;with call bell/phone within reach Nurse Communication: Mobility status PT Visit Diagnosis: Other abnormalities of gait and mobility (R26.89);Difficulty in walking, not elsewhere  classified (R26.2)    Time: 1540-0867 PT Time Calculation (min) (ACUTE ONLY): 21 min   Charges:   PT Evaluation $PT Eval Low Complexity: 1 Low          Lind Ausley B. Beverely Risen PT, DPT Acute Rehabilitation Services Pager [phone] Office [phone]   Elon Alas Fleet 12/26/2019, 9:41 AM

## 2019-12-26 NOTE — Progress Notes (Signed)
ABI study completed.   See CVProc for preliminary results.   Rinnah Peppel, RDMS, RVT 

## 2019-12-26 NOTE — TOC Transition Note (Signed)
Transition of Care [facility]) - CM/SW Discharge Note   Patient Details  Name: Benjamin Espinoza MRN: [id number] Date of Birth: June 10, 1965  [facility] CM/SW Contact:  Lawerance Sabal, RN Phone Number: 12/26/2019, 9:55 AM   Clinical Narrative:    Spoke w patient at bedside. RW to be delivered to the room.    Final next level of care: Home/Self Care Barriers to Discharge: No Barriers Identified   Patient Goals and CMS Choice        Discharge Placement                       Discharge Plan and Services                DME Arranged: Walker rolling DME Agency: [employer] Date DME Agency Contacted: 12/26/19 Time DME Agency Contacted: [phone] Representative spoke with at DME Agency: Velna Hatchet            Social Determinants of Health (SDOH) Interventions     Readmission Risk Interventions No flowsheet data found.

## 2019-12-26 NOTE — Discharge Summary (Signed)
Discharge Summary     Benjamin Espinoza Jun 19, 1965 54 y.o. male  [id number]  Admission Date: 12/25/2019  Discharge Date: 12/26/2019  Physician: [name], *  Admission Diagnosis: Peripheral arterial disease [facility]) [I73.9]  HPI:   This is a 54 y.o. male who I saw in the emergency department on 09/04/2019 with right lower extremity claudication which she had for several months.  He was a heavy smoker up to 1-1/2 packs/day but had cut back to 3 cigarettes a day.  He had a previous myocardial infarction and has undergone previous coronary revascularization.  Vein was taken from the right leg.  He also has a history of congestive heart failure.  He was set up for an arteriogram which was performed on 09/21/2019.  His arteriogram showed mild ectasia of the infrarenal aorta.  On the right side, there was moderate diffuse disease of the right common iliac artery with mild diffuse disease of the right external iliac artery.  The superficial femoral artery was occluded in the proximal thigh with reconstitution of the above-knee popliteal artery.  The below-knee popliteal artery segment.  The anterior tibial was occluded the dominant runoff was the peroneal artery.  The posterior tibial artery was occluded with reconstitution of the distal posterior tibial artery.  On the left side the left common iliac artery was patent.  The hypogastric was occluded.  There was a 40% stenosis in the proximal external iliac artery on the left.  There was a 90% stenosis in the distal SFA with moderate diffuse disease below that.  The anterior tibial artery was occluded.  The posterior tibial was occluded.  The dominant runoff was the peroneal which reconstituted the posterior tibial artery distally.  Based on his arteriogram we discussed the importance of tobacco cessation and a structured walking program.  We also discussed the importance of nutrition.  He was on aspirin and a statin.  He was set up for an  outpatient visit.  He tells me that 2 weeks ago he underwent PTCA and is now on Plavix in addition to his aspirin and statin.  He is doing well status post PTCA.  He is continuing to have severe bilateral leg claudication which is more significant on the right side.  In addition he has rest pain in the right leg.  He denies any nonhealing ulcers.  He is cut back to about 3 cigarettes a day.  He had been a heavy smoker for some time.  He states that he simply unable to walk.   Based on the patient's previous arteriogram on the left side he may be a candidate for angioplasty of the left superficial femoral artery.  He has a mild stenosis in the external iliac artery that might also need to be addressed although he has a fairly strong femoral pulse on the left.  I would like to address the left side first as on the right side he would require a bypass in accessing the right above his bypass would be associated with slightly increased risk of compromising his graft.  Once the left leg is addressed I think his only option on the right would be a femoral to below-knee popliteal artery bypass with PTFE given that he does not have vein available.  I have reviewed with the patient the indications for arteriography. In addition, I have reviewed the potential complications of arteriography including but not limited to: Bleeding, arterial injury, arterial thrombosis, dye action, renal insufficiency, or other unpredictable medical problems. I have  explained to the patient that if we find disease amenable to angioplasty we could potentially address this at the same time. I have discussed the potential complications of angioplasty and stenting, including but not limited to: Bleeding, arterial thrombosis, arterial injury, dissection, or the need for surgical intervention.   Hospital Course:  The patient was admitted to the hospital and taken to the operating room on 12/25/2019 and underwent: Right femoral to below  knee popliteal artery bypass with PTFE graft, Intraoperative arteriogram.  Findings: Anterior tibial and posterior tibial signal at the completion of the procedure.  Completion arteriogram showed no technical problems.  Peroneal and posterior tibial runoff  The pt tolerated the procedure well and was transported to the PACU in good condition.   Patient did well post op and was transferred to the floor in stable condition. Later post op he was doing well. Some post op nausea secondary to anesthesia. Not able to tolerate diet due to nausea or participate in ambulation. Pain was overall controlled. Some tightness in leg with movement. Otherwise hemodynamically stable.  By POD 1, patient continued to do well. Right lower extremity remained well perfused and warm with brisk doppler signals in the right foot. Incisions remained intact, clean and dry without any swelling or hematoma  The remainder of the hospital course consisted of increasing mobilization and increasing intake of solids without difficulty.  He remained stable for discharge home. Tolerated working with physical therapy with no recommendations for outpatient PT.  He will continue his home medications as prescribed including Aspirin, statin and Plavix. PDMP was reviewed and patient was sent post operative pain medication to the [facility] pharmacy. He will follow up with Dr. [name] on 01/09/20  CBC    Component Value Date/Time   WBC 8.9 12/26/2019 0416   RBC 3.97 (L) 12/26/2019 0416   HGB 12.4 (L) 12/26/2019 0416   HGB 15.3 11/13/2019 1359   HCT 37.6 (L) 12/26/2019 0416   HCT 44.5 11/13/2019 1359   PLT 296 12/26/2019 0416   PLT 267 11/13/2019 1359   MCV 94.7 12/26/2019 0416   MCV 91 11/13/2019 1359   MCH 31.2 12/26/2019 0416   MCHC 33.0 12/26/2019 0416   RDW 14.6 12/26/2019 0416   RDW 13.6 11/13/2019 1359    BMET    Component Value Date/Time   NA 136 12/26/2019 0416   NA 140 11/13/2019 1359   K 3.9 12/26/2019 0416     CL 105 12/26/2019 0416   CO2 25 12/26/2019 0416   GLUCOSE 107 (H) 12/26/2019 0416   BUN 11 12/26/2019 0416   BUN 15 11/13/2019 1359   CREATININE 1.04 12/26/2019 0416   CALCIUM 9.3 12/26/2019 0416   GFRNONAA >60 12/26/2019 0416   GFRAA 99 11/13/2019 1359     Discharge Instructions    AMB Referral to Advanced Lipid Disorders Clinic   Complete by: As directed    Reason for referral: Follow-up patients for medication management   Provider to see patient: MD-Dr.Hilty   Internal Lipid Clinic Referral Scheduling  Internal lipid clinic referrals are providers within [facility], who wish to refer established patients for routine management (help in starting PCSK9 inhibitor therapy) or advanced therapies.  Internal MD referral criteria:              1. All patients with LDL>190 mg/dL  2. All patients with Triglycerides >500 mg/dL  3. Patients with suspected or confirmed heterozygous familial hyperlipidemia (HeFH) or homozygous familial hyperlipidemia (HoFH)  4. Patients with family  history of suspicious for genetic dyslipidemia desiring genetic testing  5. Patients refractory to standard guideline based therapy  6. Patients with statin intolerance (failed 2 statins, one of which must be a high potency statin)  7. Patients who the provider desires to be seen by MD   Internal PharmD referral criteria:   1. Follow-up patients for medication management  2. Follow-up for compliance monitoring  3. Patients for drug education  4. Patients with statin intolerance  5. PCSK9 inhibitor education and prior authorization approvals  6. Patients with triglycerides <500 mg/dL  External Lipid Clinic Referral  External lipid clinic referrals are for providers outside of [facility], considered new clinic patients - automatically routed to MD schedule   Discharge patient   Complete by: As directed    Discharge disposition: 01-Home or Self Care   Discharge patient date: 12/26/2019      Discharge  Diagnosis:  Peripheral arterial disease (HCC) [I73.9]  Secondary Diagnosis: Patient Active Problem List   Diagnosis Date Noted  . Peripheral arterial disease (HCC) 12/25/2019  . Sinus bradycardia 11/29/2019  . Abnormal stress test   . Shortness of breath   . DDD (degenerative disc disease), lumbar 11/08/2019  . Essential hypertension   . Hx of CABG   . Hyperlipidemia   . Myocardial infarction (HCC)   . Tobacco use disorder   . PVD (peripheral vascular disease) (HCC) 09/21/2019  . Atypical chest pain 05/07/2017   Past Medical History:  Diagnosis Date  . Abnormal stress test   . Anxiety   . Asthma    as a child  . Atypical chest pain 05/07/2017  . Coronary artery disease   . DDD (degenerative disc disease), lumbar 11/08/2019   Formatting of this note might be different from the original. [facility] [place] [facility]  . Essential hypertension   . 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 Hyperlipidemia   . Myocardial infarction (HCC) 2017  . PVD (peripheral vascular disease) (HCC) 09/21/2019  . Shortness of breath   . Tobacco use disorder      Allergies as of 12/26/2019   No Known Allergies     Medication List    STOP taking these medications   nitroGLYCERIN 0.4 MG SL tablet Commonly known as: Nitrostat     TAKE these medications   aspirin EC 81 MG tablet Take 2 tablets (162 mg total) by mouth daily.   atorvastatin 80 MG tablet Commonly known as: LIPITOR Take 80 mg by mouth daily.   cilostazol 100 MG tablet Commonly known as: PLETAL Take 100 mg by mouth daily.   clopidogrel 75 MG tablet Commonly known as: PLAVIX Take 1 tablet (75 mg total) by mouth daily.   gabapentin 600 MG tablet Commonly known as: NEURONTIN Take 600 mg by mouth 2 (two) times daily as needed (Pain).   losartan 25 MG tablet Commonly known as: COZAAR Take 1 tablet (25 mg total) by mouth daily.   metoprolol succinate 25 MG 24 hr tablet Commonly known as: TOPROL-XL Take 12.5 mg by mouth daily.     oxyCODONE-acetaminophen 5-325 MG tablet Commonly known as: Percocet Take 1-2 tablets by mouth every 6 (six) hours as needed for severe pain. What changed: when to take this   ranolazine 500 MG 12 hr tablet Commonly known as: Ranexa Take 1 tablet (500 mg total) by mouth 2 (two) times daily.   Repatha SureClick 140 MG/ML Soaj Generic drug: Evolocumab Inject the contents of 1 pen (140 mg) under the skin  every fourteen (14) days. What changed: See the new instructions.            Durable Medical Equipment  (From admission, onward)         Start     Ordered   12/26/19 0941  For home use only DME Walker rolling  Once       Question Answer Comment  Walker: With 5 Inch Wheels   Patient needs a walker to treat with the following condition Weakness      12/26/19 0940          Discharge Instructions: [facility] Discharge instructions Lower Extremity Bypass Surgery  Please refer to the following instruction for your post-procedure care. Your surgeon or physician assistant will discuss any changes with you.  Activity  You are encouraged to walk as much as you can. You can slowly return to normal activities during the month after your surgery. Avoid strenuous activity and heavy lifting until your doctor tells you it's OK. Avoid activities such as vacuuming or swinging a golf club. Do not drive until your doctor give the OK and you are no longer taking prescription pain medications. It is also normal to have difficulty with sleep habits, eating and bowel movement after surgery. These will go away with time.  Bathing/Showering  You may shower after you go home. Do not soak in a bathtub, hot tub, or swim until the incision heals completely.  Incision Care  Clean your incision with mild soap and water. Shower every day. Pat the area dry with a clean towel. You do not need a bandage unless otherwise instructed. Do not apply any ointments or creams  to your incision. If you have open wounds you will be instructed how to care for them or a visiting nurse may be arranged for you. If you have staples or sutures along your incision they will be removed at your post-op appointment. You may have skin glue on your incision. Do not peel it off. It will come off on its own in about one week.  Wash the groin wound with soap and water daily and pat dry. (No tub bath-only shower)  Then put a dry gauze or washcloth in the groin to keep this area dry to help prevent wound infection.  Do this daily and as needed.  Do not use Vaseline or neosporin on your incisions.  Only use soap and water on your incisions and then protect and keep dry.  Diet  Resume your normal diet. There are no special food restrictions following this procedure. A low fat/ low cholesterol diet is recommended for all patients with vascular disease. In order to heal from your surgery, it is CRITICAL to get adequate nutrition. Your body requires vitamins, minerals, and protein. Vegetables are the best source of vitamins and minerals. Vegetables also provide the perfect balance of protein. Processed food has little nutritional value, so try to avoid this.  Medications  Resume taking all your medications unless your doctor or Physician Assistant tells you not to. If your incision is causing pain, you may take over-the-counter pain relievers such as acetaminophen (Tylenol). If you were prescribed a stronger pain medication, please aware these medication can cause nausea and constipation. Prevent nausea by taking the medication with a snack or meal. Avoid constipation by drinking plenty of fluids and eating foods with high amount of fiber, such as fruits, vegetables, and grains. Take Colace 100 mg (an over-the-counter stool softener) twice a day  as needed for constipation.   Do not take Tylenol if you are taking prescription pain medications.  Follow Up  Our office will schedule a follow up  appointment 2-3 weeks following discharge.  Please call us immediately for any of the following conditions  .Severe or worsening pain in your legs or feet while at rest or while walking .Increase pain, redness, warmth, or drainage (pus) from your incision site(s) . Fever of 101 degree or higher . The swelling in your leg with the bypass suddenly worsens and becomes more painful than when you were in the hospital . If you have been instructed to feel your graft pulse then you should do so every day. If you can no longer feel this pulse, call the office immediately. Not all patients are given this instruction. .  Leg swelling is common after leg bypass surgery.  The swelling should improve over a few months following surgery. To improve the swelling, you may elevate your legs above the level of your heart while you are sitting or resting. Your surgeon or physician assistant may ask you to apply an ACE wrap or wear compression (TED) stockings to help to reduce swelling.  Reduce your risk of vascular disease  Stop smoking. If you would like help call [facility] at 1-800-QUIT-NOW (501-492-8804) or [place] at 651-350-9597.  . Manage your cholesterol . Maintain a desired weight . Control your diabetes weight . Control your diabetes . Keep your blood pressure down .  If you have any questions, please call the office at [phone]   Prescriptions given: Hydrocodone- Acetaminophen 5-325 mg #30 No Refill  Disposition: Home  Patient's condition: is Good  Follow up: 1. Dr. [name] in 2-3 weeks   Nathanial Rancher, PA-C [facility] [phone] 12/26/2019  10:32 AM  - For VQI Registry use ---   Post-op:  Wound infection: No  Graft infection: No  Transfusion: No    If yes, 0 units given New Arrhythmia: No Ipsilateral amputation: No, [ ]  Minor, [ ]  BKA, [ ]  AKA Discharge patency: ] Primary, [ ]  Primary assisted, [ ]  Secondary, [ ]  Occluded Patency judged  by: [X]  Dopper only, [ ]  Palpable graft pulse, [X]  Palpable distal pulse, [ ]  ABI inc. > 0.15, [ ]  Duplex D/C Ambulatory Status: Ambulatory with Assistance  Complications: MI: No, [ ]  Troponin only, [ ]  EKG or Clinical CHF: No Resp failure:No, [ ]  Pneumonia, [ ]  Ventilator Chg in renal function: No, [ ]  Inc. Cr > 0.5, [ ]  Temp. Dialysis,  [ ]  Permanent dialysis Stroke: No, [ ]  Minor, [ ]  Major Return to OR: No  Reason for return to OR: [ ]  Bleeding, [ ]  Infection, [ ]  Thrombosis, [ ]  Revision  Discharge medications: Statin use:  yes ASA use:  yes Plavix use:  yes Beta blocker use: yes CCB use:  No ACEI use:   no ARB use:  yes Coumadin use: no

## 2019-12-26 NOTE — Progress Notes (Signed)
Discharge instructions provided to patient. Follow-up appointments and site care discussed. All questions answered. IVs removed. Patient to be escorted home by family.   Allegra Grana RN

## 2019-12-26 NOTE — Evaluation (Signed)
Occupational Therapy Evaluation and Discharge Patient Details Name: Benjamin Espinoza MRN: [id number] DOB: [birth year] Today's Date: 12/26/2019    History of Present Illness 54 y.o. male seen in the ED  09/04/2019 with right lower extremity claudication which he had for several months. Pt countinued to have severe B LE claudication R>L limiting his ablility to walk. 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   Clinical Impression   Pt requires up to min assist for ADL, but has an aide who assists him daily. No OT needs.    Follow Up Recommendations  No OT follow up    Equipment Recommendations  None recommended by OT    Recommendations for Other Services       Precautions / Restrictions Precautions Precautions: Fall Restrictions Weight Bearing Restrictions: No      Mobility Bed Mobility               General bed mobility comments: OOB in recliner on entry     Transfers Overall transfer level: Needs assistance Equipment used: Rolling walker (2 wheeled) Transfers: Sit to/from Stand Sit to Stand: Min guard         General transfer comment: min guard for safety, vc for hand placement for power up and steadying in RW    Balance Overall balance assessment: Mild deficits observed, not formally tested                                         ADL either performed or assessed with clinical judgement   ADL Overall ADL's : Needs assistance/impaired Eating/Feeding: Independent   Grooming: Supervision/safety;Standing   Upper Body Bathing: Set up;Sitting   Lower Body Bathing: Minimal assistance;Sit to/from stand   Upper Body Dressing : Set up;Sitting   Lower Body Dressing: Minimal assistance;Sit to/from stand   Toilet Transfer: Min guard;Ambulation;RW   Toileting- Architect  and Hygiene: Min guard;Sit to/from stand       Functional mobility during ADLs: Min guard;Rolling walker       Vision Patient Visual Report: No change from baseline       Perception     Praxis      Pertinent Vitals/Pain Pain Assessment: Faces Pain Score: 10-Worst pain ever Faces Pain Scale: Hurts whole lot Pain Location: groin incision  Pain Descriptors / Indicators: Sharp;Shooting Pain Intervention(s): Repositioned     Hand Dominance Right   Extremity/Trunk Assessment Upper Extremity Assessment Upper Extremity Assessment: Overall WFL for tasks assessed   Lower Extremity Assessment Lower Extremity Assessment: Defer to PT evaluation RLE Deficits / Details: ROM WFL RLE: Unable to fully assess due to pain RLE Sensation: decreased light touch RLE Coordination: decreased fine motor       Communication Communication Communication: No difficulties   Cognition Arousal/Alertness: Awake/alert Behavior During Therapy: WFL for tasks assessed/performed Overall Cognitive Status: Within Functional Limits for tasks assessed                                     General Comments      Exercises    Shoulder Instructions      Home Living Family/patient expects to be discharged to:: Private residence Living Arrangements: Alone Available Help at Discharge:  Personal care attendant Type of Home: Apartment Home Access: Stairs to enter Entrance Stairs-Number of Steps: 3 Entrance Stairs-Rails: Can reach both Home Layout: One level     Bathroom Shower/Tub: Chief Strategy Officer: Standard Bathroom Accessibility: Yes   Home Equipment: Cane - single point          Prior Functioning/Environment Level of Independence: Needs assistance  Gait / Transfers Assistance Needed: uses cane for limited community ambulation  ADL's / Homemaking Assistance Needed: aide assist with bathing and dressing, online grocery shopping, aide does cooking and  cleaning            OT Problem List:        OT Treatment/Interventions:      OT Goals(Current goals can be found in the care plan section) Acute Rehab OT Goals Patient Stated Goal: have less pain   OT Frequency:     Barriers to D/C:            Co-evaluation              AM-PAC OT "6 Clicks" Daily Activity     Outcome Measure Help from another person eating meals?: None Help from another person taking care of personal grooming?: A Little Help from another person toileting, which includes using toliet, bedpan, or urinal?: A Little Help from another person bathing (including washing, rinsing, drying)?: A Little Help from another person to put on and taking off regular upper body clothing?: None Help from another person to put on and taking off regular lower body clothing?: A Little 6 Click Score: 20   End of Session    Activity Tolerance: Patient tolerated treatment well Patient left: in chair;with call bell/phone within reach  OT Visit Diagnosis: Pain                Time: 2330-0762 OT Time Calculation (min): 14 min Charges:  OT General Charges $OT Visit: 1 Visit OT Evaluation $OT Eval Moderate Complexity: 1 Mod  Martie Round, OTR/L Acute Rehabilitation Services Pager: [phone] Office: [phone]  Evern Bio 12/26/2019, 10:50 AM

## 2019-12-28 ENCOUNTER — Telehealth: Payer: Self-pay | Admitting: *Deleted

## 2019-12-28 NOTE — Telephone Encounter (Signed)
Pt called complaining of pain after surgery. Pt states he is unable to walk to the bathroom due to pain. Pt was prescribed percocet 5/325 #30 on 10/27. Pt states he has 7 pills left and is requesting more. Spoke with Ward PA. This office will not be prescribing him any more narcotics. Pt is to follow up with the pain clinic where he is already established. Pt verbalized understanding.

## 2020-01-01 ENCOUNTER — Telehealth: Payer: Self-pay

## 2020-01-01 NOTE — Telephone Encounter (Signed)
Patient called to c/o swelling and pain - he is s/p bypass on 10/26. Went to church and climbed stairs and noted increased swelling. States it has gone down since then but is still bothersome. Explained that some swelling post surgery was normal and to be expected. Says it hurts when he begins to walk with walker or cane but eases off as he continues. Encouraged patient to continue to ambulate and slowly increase activity. He says he still feels the pulse in that leg and that the pain is about the same. He states he takes 4-5 aleve to help. Instructed him to take no more than 2 at a time and that should suffice for 12 hours. Also c/o difficulty having bowel movements. Discussed reasons such as decreased activity and pain medication - encouraged patient to take a stool softener to assist. Patient verbalized understanding. Patient has f/u appt on 11/10 - Instructed to call back sooner if problems persist.

## 2020-01-02 ENCOUNTER — Encounter (HOSPITAL_COMMUNITY): Payer: Medicare HMO

## 2020-01-09 ENCOUNTER — Other Ambulatory Visit: Payer: Self-pay

## 2020-01-09 ENCOUNTER — Ambulatory Visit (INDEPENDENT_AMBULATORY_CARE_PROVIDER_SITE_OTHER): Payer: Medicare HMO | Admitting: Vascular Surgery

## 2020-01-09 ENCOUNTER — Encounter: Payer: Self-pay | Admitting: Vascular Surgery

## 2020-01-09 VITALS — BP 114/78 | HR 101 | Temp 98.9°F | Resp 20 | Ht 70.0 in | Wt 190.0 lb

## 2020-01-09 DIAGNOSIS — I739 Peripheral vascular disease, unspecified: Secondary | ICD-10-CM

## 2020-01-09 MED ORDER — OXYCODONE-ACETAMINOPHEN 5-325 MG PO TABS
1.0000 | ORAL_TABLET | ORAL | 0 refills | Status: DC | PRN
Start: 2020-01-09 — End: 2022-08-26

## 2020-01-09 NOTE — Progress Notes (Signed)
Patient name: Benjamin Espinoza MRN: [id number] DOB: 1966-02-19 Sex: male  REASON FOR VISIT:   Follow-up after femoropopliteal bypass  HPI:   Benjamin Espinoza is a pleasant 54 y.o. male who presented with critical limb ischemia of the right lower extremity.  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  He underwent a right femoral to below-knee popliteal artery bypass with PTFE as he had no available vein.  Vein had previously been taken on both legs. His completion arteriogram showed two-vessel runoff via the peroneal and posterior tibial arteries.  He did well postoperatively and was discharged on postoperative day #1.  He was on aspirin and a statin.  We discussed with him again the importance of tobacco cessation.  Also discussed with him the importance of exercise and nutrition.  He comes in for his first outpatient visit.  Overall the patient is doing well.  His right leg does feel better.  He has some mild left calf claudication.  He has some paresthesias in his distal right leg which I think are expected given the below the knee incision.  He really has not had any problems with leg swelling.  He still having incisional pain.  Current Outpatient Medications  Medication Sig Dispense Refill  . aspirin EC 81 MG tablet Take 2 tablets (162 mg total) by mouth daily. 30 tablet 0  . atorvastatin (LIPITOR) 80 MG tablet Take 80 mg by mouth daily.    . cilostazol (PLETAL) 100 MG tablet Take 100 mg by mouth daily.     . clopidogrel (PLAVIX) 75 MG tablet Take 1 tablet (75 mg total) by mouth daily. 90 tablet 3  . gabapentin (NEURONTIN) 600 MG tablet Take 600 mg by mouth 2 (two) times daily as needed (Pain).     Marland Kitchen losartan (COZAAR) 25 MG tablet Take 1 tablet (25 mg total) by mouth daily. 90 tablet 3  . metoprolol succinate (TOPROL-XL) 25 MG 24 hr tablet Take 12.5 mg by mouth daily.    . ranolazine (RANEXA) 500 MG 12 hr tablet Take 1  tablet (500 mg total) by mouth 2 (two) times daily. 60 tablet 3  . REPATHA SURECLICK 140 MG/ML SOAJ Inject the contents of 1 pen (140 mg) under the skin every fourteen (14) days. (Patient taking differently: Inject 140 mg into the skin every 14 (fourteen) days. ) 6 mL 3  . oxyCODONE-acetaminophen (PERCOCET) 5-325 MG tablet Take 1-2 tablets by mouth every 6 (six) hours as needed for severe pain. (Patient not taking: Reported on 01/09/2020) 30 tablet 0   Current Facility-Administered Medications  Medication Dose Route Frequency Provider Last Rate Last Admin  . sodium chloride flush (NS) 0.9 % injection 3 mL  3 mL Intravenous Q12H Tobb, Kardie, DO        REVIEW OF SYSTEMS:  [X]  denotes positive finding, [ ]  denotes negative finding Vascular    Leg swelling    Cardiac    Chest pain or chest pressure:    Shortness of breath upon exertion:    Short of breath when lying flat:    Irregular heart rhythm:    Constitutional    Fever or chills:     PHYSICAL EXAM:   Vitals:   01/09/20 1430  BP: 114/78  Pulse: (!) 101  Resp: 20  Temp: 98.9 F (37.2 C)  SpO2: 96%  Weight: 190 lb (86.2 kg)  Height: 5\' 10"  (1.778 m)  GENERAL: The patient is a well-nourished male, in no acute distress. The vital signs are documented above. CARDIOVASCULAR: There is a regular rate and rhythm. PULMONARY: There is good air exchange bilaterally without wheezing or rales. VASCULAR: His groin incision and his below the knee incision on the right are healing well.  He has a brisk posterior tibial and peroneal signal with the Doppler.  DATA:   No new data.  MEDICAL ISSUES:   S/P RIGHT FEMORAL TO BELOW-KNEE POPLITEAL ARTERY BYPASS (PTFE): His bypass graft is patent with brisk Doppler signals in the posterior tibial and peroneal positions on the right.  I think overall he is doing well.  He has also undergone drug-coated balloon angioplasty of the left superficial femoral artery on 12/07/2019.  He still smoking a  few cigarettes a day and I encouraged him to try to get off the cigarettes completely.  We discussed the importance of walking and nutrition again.  He still having significant pain and is requesting pain medicine.  I sent a prescription for 25 Percocet for pain and instructed him that I will have to get off these completely within the next 2 weeks.  I have ordered a graft duplex and ABIs in 3 months and I will see him back at that time.  He knows to call sooner if he has problems.  [name] [facility] [phone]

## 2020-01-14 ENCOUNTER — Other Ambulatory Visit: Payer: Self-pay

## 2020-01-14 DIAGNOSIS — I739 Peripheral vascular disease, unspecified: Secondary | ICD-10-CM

## 2020-01-16 MED FILL — REPATHA SURECLICK 140 MG/ML SUBCUTANEOUS PEN INJECTOR: SUBCUTANEOUS | 84 days supply | Qty: 6 | Fill #1

## 2020-01-16 MED FILL — REPATHA SURECLICK 140 MG/ML SUBCUTANEOUS PEN INJECTOR: 84 days supply | Qty: 6 | Fill #1 | Status: AC

## 2020-01-16 NOTE — Unmapped (Signed)
[facility] Refill Coordination Note    Specialty Medication(s) to be Shipped:   General Specialty: Repatha    Other medication(s) to be shipped: No additional medications requested for fill at this time     Glen Gross, DOB: 07-07-1965  Phone: [phone] (home)       All above HIPAA information was verified with patient.     Was a Nurse, learning disability used for this call? No    Completed refill call assessment today to schedule patient's medication shipment from the [facility] [phone]).       Specialty medication(s) and dose(s) confirmed: Regimen is correct and unchanged.   Changes to medications: [name] reports no changes at this time.  Changes to insurance: No  Questions for the pharmacist: No    Confirmed patient received Welcome Packet with first shipment. The patient will receive a drug information handout for each medication shipped and additional FDA Medication Guides as required.       DISEASE/MEDICATION-SPECIFIC INFORMATION        For patients on injectable medications: Patient currently has 0 doses left.  Next injection is scheduled for 01/15/20.    SPECIALTY MEDICATION ADHERENCE     Medication Adherence    Patient reported X missed doses in the last month: 0  Specialty Medication: Repatha 140mg /ml  Patient is on additional specialty medications: No  Patient is on more than two specialty medications: No                Repatha 140 mg/ml: 0 days of medicine on hand         SHIPPING     Shipping address confirmed in Epic.     Delivery Scheduled: Yes, Expected medication delivery date: 01/17/20.     Medication will be delivered via UPS to the prescription address in Epic WAM.    Nancy Nordmann [facility] Specialty Technician

## 2020-02-06 ENCOUNTER — Other Ambulatory Visit: Payer: Self-pay

## 2020-02-06 DIAGNOSIS — I2510 Atherosclerotic heart disease of native coronary artery without angina pectoris: Secondary | ICD-10-CM | POA: Insufficient documentation

## 2020-02-06 DIAGNOSIS — F419 Anxiety disorder, unspecified: Secondary | ICD-10-CM | POA: Insufficient documentation

## 2020-02-06 DIAGNOSIS — J45909 Unspecified asthma, uncomplicated: Secondary | ICD-10-CM | POA: Insufficient documentation

## 2020-02-11 ENCOUNTER — Ambulatory Visit: Payer: Medicare HMO | Admitting: Cardiology

## 2020-02-13 ENCOUNTER — Encounter: Payer: Medicare HMO | Admitting: Vascular Surgery

## 2020-02-21 ENCOUNTER — Telehealth: Payer: Self-pay | Admitting: *Deleted

## 2020-02-21 ENCOUNTER — Telehealth (INDEPENDENT_AMBULATORY_CARE_PROVIDER_SITE_OTHER): Payer: Medicare HMO | Admitting: Internal Medicine

## 2020-02-21 ENCOUNTER — Other Ambulatory Visit: Payer: Self-pay | Admitting: *Deleted

## 2020-02-21 ENCOUNTER — Encounter: Payer: Self-pay | Admitting: Internal Medicine

## 2020-02-21 VITALS — Ht 73.0 in | Wt 200.0 lb

## 2020-02-21 DIAGNOSIS — E785 Hyperlipidemia, unspecified: Secondary | ICD-10-CM

## 2020-02-21 DIAGNOSIS — I2510 Atherosclerotic heart disease of native coronary artery without angina pectoris: Secondary | ICD-10-CM

## 2020-02-21 DIAGNOSIS — I779 Disorder of arteries and arterioles, unspecified: Secondary | ICD-10-CM

## 2020-02-21 DIAGNOSIS — Z951 Presence of aortocoronary bypass graft: Secondary | ICD-10-CM

## 2020-02-21 NOTE — Addendum Note (Signed)
Addended by: Tobin Chad on: 02/21/2020 10:41 AM   Modules accepted: Orders

## 2020-02-21 NOTE — Telephone Encounter (Signed)
°  Patient Consent for Virtual Visit         Benjamin Espinoza has provided verbal consent on 02/21/2020 for a virtual visit (video or telephone).   CONSENT FOR VIRTUAL VISIT FOR:  Benjamin Espinoza  By participating in this virtual visit I agree to the following:  I hereby voluntarily request, consent and authorize [facility] and its employed or contracted physicians, physician assistants, nurse practitioners or other licensed health care professionals (the Practitioner), to provide me with telemedicine health care services (the Services") as deemed necessary by the treating Practitioner. I acknowledge and consent to receive the Services by the Practitioner via telemedicine. I understand that the telemedicine visit will involve communicating with the Practitioner through live audiovisual communication technology and the disclosure of certain medical information by electronic transmission. I acknowledge that I have been given the opportunity to request an in-person assessment or other available alternative prior to the telemedicine visit and am voluntarily participating in the telemedicine visit.  I understand that I have the right to withhold or withdraw my consent to the use of telemedicine in the course of my care at any time, without affecting my right to future care or treatment, and that the Practitioner or I may terminate the telemedicine visit at any time. I understand that I have the right to inspect all information obtained and/or recorded in the course of the telemedicine visit and may receive copies of available information for a reasonable fee.  I understand that some of the potential risks of receiving the Services via telemedicine include:   Delay or interruption in medical evaluation due to technological equipment failure or disruption;  Information transmitted may not be sufficient (e.g. poor resolution of images) to allow for appropriate medical decision making by the  Practitioner; and/or   In rare instances, security protocols could fail, causing a breach of personal health information.  Furthermore, I acknowledge that it is my responsibility to provide information about my medical history, conditions and care that is complete and accurate to the best of my ability. I acknowledge that Practitioner's advice, recommendations, and/or decision may be based on factors not within their control, such as incomplete or inaccurate data provided by me or distortions of diagnostic images or specimens that may result from electronic transmissions. I understand that the practice of medicine is not an exact science and that Practitioner makes no warranties or guarantees regarding treatment outcomes. I acknowledge that a copy of this consent can be made available to me via my patient portal [facility] MyChart), or I can request a printed copy by calling the office of [facility].    I understand that my insurance will be billed for this visit.   I have read or had this consent read to me.  I understand the contents of this consent, which adequately explains the benefits and risks of the Services being provided via telemedicine.   I have been provided ample opportunity to ask questions regarding this consent and the Services and have had my questions answered to my satisfaction.  I give my informed consent for the services to be provided through the use of telemedicine in my medical care

## 2020-02-21 NOTE — Telephone Encounter (Signed)
Called  And spoke to patient . Patient states he was unaware of visit with Dr Rennis Golden today  And taht Dr Servando Salina was his heart doctor.  RN informed patient that  Dr Servando Salina request an appointment with patient and Dr Rennis Golden to discus cholesterol.  patient states he is in the bed now and does not want to proceed with the appointment now,  Patient is aware office will call to reschedule appointment.

## 2020-02-21 NOTE — Progress Notes (Signed)
Virtual Visit via Telephone Note   This visit type was conducted due to national recommendations for restrictions regarding the COVID-19 Pandemic (e.g. social distancing) in an effort to limit this patient's exposure and mitigate transmission in our community.  Due to his co-morbid illnesses, this patient is at least at moderate risk for complications without adequate follow up.  This format is felt to be most appropriate for this patient at this time.  The patient did not have access to video technology/had technical difficulties with video requiring transitioning to audio format only (telephone).  All issues noted in this document were discussed and addressed.  No physical exam could be performed with this format.  Please refer to the patient's chart for his  consent to telehealth for [facility].   Date:  02/21/2020   ID:  Benjamin Espinoza, DOB 1965-12-09, MRN [id number] The patient was identified using 2 identifiers.  Evaluation Performed:  New Patient Evaluation  Patient Location:  [address] [place] [address]  Provider location:   [address], [address] [place], [place] [address]  PCP:  Teena Irani, PA-C  Cardiologist:  Thomasene Ripple, DO Electrophysiologist:  None   Chief Complaint:  Manage dyslipidemia  History of Present Illness:    Benjamin Espinoza is a 54 y.o. male who presents via audio/video conferencing for a telehealth visit today.  This is a pleasant 54 year old male who is kindly referred for evaluation management of dyslipidemia.  He has a complex cardiovascular disease history including history of CABG in 2017 and recent multivessel PCI in September 2021.  This was followed by PAOD drug coated superficial femoral artery angioplasty and ultimately right femoral to below the knee popliteal artery bypass.  Since his procedure he says he has been walking better with less pain in his legs although has become more recently short of breath and  fatigue.  While hospitalized for his PAD, he was evaluated by pharmacy for dyslipidemia per protocol and felt not to be at target based on lab work 1 month ago which showed total cholesterol 128, triglycerides 62, HDL 31 and LDL of 85.  According to Benjamin Espinoza, he has been on Repatha which was started by Dr. Servando Salina in February and he uses it every 2 weeks.  In addition just about 1 to 2 months ago his atorvastatin dose had been increased up to 80 mg.  This may not have been reflected in his lab work about 1 month ago.  He reports tolerating both medications without issue.  He has recently tried to make dietary changes and is getting a 14-day healthy meal program provided to insurance.  He is also started walking more because of his PAOD.  The patient does not have symptoms concerning for COVID-19 infection (fever, chills, cough, or new SHORTNESS OF BREATH).    Prior CV studies:   The following studies were reviewed today:  Chart reviewed  PMHx:  Past Medical History:  Diagnosis Date  . Abnormal stress test   . Anxiety   . Asthma    as a child  . Atypical chest pain 05/07/2017  . Coronary artery disease   . DDD (degenerative disc disease), lumbar 11/08/2019   Formatting of this note might be different from the original. [facility]  . Essential hypertension   . 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 Hyperlipidemia   . Myocardial infarction (HCC) 2017  . PVD (peripheral vascular disease) (HCC) 09/21/2019  . Shortness of  breath   . Tobacco use disorder     Past Surgical History:  Procedure Laterality Date  . ABDOMINAL AORTOGRAM W/LOWER EXTREMITY Bilateral 09/21/2019   Procedure: ABDOMINAL AORTOGRAM W/LOWER EXTREMITY;  Surgeon: [name], MD;  [facility] CV LAB;  Service: Cardiovascular;  Laterality: Bilateral;  . ABDOMINAL AORTOGRAM W/LOWER EXTREMITY N/A 12/07/2019   Procedure: ABDOMINAL AORTOGRAM W/LOWER EXTREMITY;  Surgeon: [name], MD;  Location:  [facility] INVASIVE CV LAB;  Service: Cardiovascular;  Laterality: N/A;  . CARDIAC CATHETERIZATION    . CORONARY ARTERY BYPASS GRAFT     quadruple bypas 2017  . CORONARY STENT INTERVENTION N/A 11/26/2019   Procedure: CORONARY STENT INTERVENTION;  Surgeon: Yvonne Kendall, MD;  Location: [facility] INVASIVE CV LAB;  Service: Cardiovascular;  Laterality: N/A;  . FEMORAL-POPLITEAL BYPASS GRAFT Right 12/25/2019   Procedure: BYPASS GRAFT FEMORAL- BELOW KNEE POPLITEAL ARTERY RIGHT USING GORE PROPATEN VASCULAR GRAFT;  Surgeon: [name], MD;  Location: [facility];  Service: Vascular;  Laterality: Right;  . LEFT HEART CATH AND CORS/GRAFTS ANGIOGRAPHY N/A 11/26/2019   Procedure: LEFT HEART CATH AND CORS/GRAFTS ANGIOGRAPHY;  Surgeon: Yvonne Kendall, MD;  Location: [facility] INVASIVE CV LAB;  Service: Cardiovascular;  Laterality: N/A;  . LOWER EXTREMITY ANGIOGRAM Right 12/25/2019   Procedure: RIGHT LOWER EXTREMITY ANGIOGRAM;  Surgeon: [name], MD;  Location: [facility] OR;  Service: Vascular;  Laterality: Right;  . PERIPHERAL VASCULAR BALLOON ANGIOPLASTY Left 12/07/2019   Procedure: PERIPHERAL VASCULAR BALLOON ANGIOPLASTY;  Surgeon: [name], MD;  Location: [facility] INVAS[facility] CV LAB;  Service: Cardiovascular;  Laterality: Left;  SFA    FAMHx:  Family History  Problem Relation Age of Onset  . Heart disease Mother   . Heart disease Father   . Heart disease Sister     SOCHx:   reports that he has been smoking cigars and cigarettes. He has smoked for the past 25.00 years. He has never used smokeless tobacco. He reports that he does not drink alcohol and does not use drugs.  ALLERGIES:  No Known Allergies  MEDS:  No outpatient medications have been marked as taking for the 02/21/20 encounter (Appointment) with Chrystie Nose, MD.   Current Facility-Administered Medications for the 02/21/20 encounter (Appointment) with Chrystie Nose, MD  Medication  . sodium chloride flush (NS) 0.9 %  injection 3 mL     ROS: Pertinent items noted in HPI and remainder of comprehensive ROS otherwise negative.  Labs/Other Tests and Data Reviewed:    Recent Labs: 12/20/2019: ALT 20 12/26/2019: BUN 11; Creatinine, Ser 1.04; Hemoglobin 12.4; Platelets 296; Potassium 3.9; Sodium 136   Recent Lipid Panel Lab Results  Component Value Date/Time   CHOL 128 12/26/2019 04:17 AM   TRIG 62 12/26/2019 04:17 AM   HDL 31 (L) 12/26/2019 04:17 AM   CHOLHDL 4.1 12/26/2019 04:17 AM   LDLCALC 85 12/26/2019 04:17 AM    Wt Readings from Last 3 Encounters:  01/09/20 190 lb (86.2 kg)  12/25/19 198 lb (89.8 kg)  12/20/19 197 lb 12.8 oz (89.7 kg)     Exam:    Vital Signs:  There were no vitals taken for this visit.   Exam not performed due to telephone visit  ASSESSMENT & PLAN:    1. 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 CAD s/p CABG (2017) and recent multivessel PCI (10/2019) 3. PAOD s/o Left fem-pop bypass  Benjamin Espinoza has a mixed dyslipidemia with aggressive vascular disease including recent multivessel PCI, prior CABG and recent  left femoropopliteal bypass.  His cholesterol was above target LDL less than 70 about a month ago.  He is on excellent therapy including Repatha and high potency atorvastatin however the atorvastatin was recently increased in the past couple of months.  I suspect he has not yet reached his low LDL as expected with the increase in his atorvastatin.  I would recommend we continue his current therapy and repeat lipids in about 2 months.  This will give him more time as well to increase physical activity now that he has more feeling in his leg which should help his lipids and make the dietary changes which should also be helpful.  At that time if he remains above target, would consider adding ezetimibe 10 mg daily.  In addition he is reported some increasing shortness of breath and fatigue.  I have encouraged him to reach out to Dr. Servando Salina for an appointment and I spoke with my  nurse today to see if we could arrange that possibly in January.  He mentioned he is a smoker and he is working to try to cut back.  COVID-19 Education: The signs and symptoms of COVID-19 were discussed with the patient and how to seek care for testing (follow up with PCP or arrange E-visit).  The importance of social distancing was discussed today.  Patient Risk:   After full review of this patients clinical status, I feel that they are at least moderate risk at this time.  Time:   Today, I have spent 35 minutes with the patient with telehealth technology discussing dyslipidemia, dyspnea, recent PCI, recent left fem pop bypass.     Medication Adjustments/Labs and Tests Ordered: Current medicines are reviewed at length with the patient today.  Concerns regarding medicines are outlined above.   Tests Ordered: No orders of the defined types were placed in this encounter.   Medication Changes: No orders of the defined types were placed in this encounter.   Disposition:  in 3 month(s)  Chrystie Nose, MD, [facility], FACP  [place]  [facility]  Medical Director of the Advanced Lipid Disorders &  Cardiovascular Risk Reduction Clinic Diplomate of the American Board of Clinical Lipidology Attending Cardiologist  Direct Dial: [phone]  Fax: [phone]  Website:  [email or web address]  Chrystie Nose, MD  02/21/2020 9:00 AM

## 2020-02-21 NOTE — Telephone Encounter (Signed)
RN spoke to patient. Instruction were given  from today's virtual visit 02/21/20.  AVS SUMMARY has been mailed and labslip  Appointment schedule for Dr Rennis Golden virtual 04/22/19 at 9 am .   Patient verbalized understanding

## 2020-02-21 NOTE — Patient Instructions (Addendum)
Medication Instructions:  No changes  *If you need a refill on your cardiac medications before your next appointment, please call your pharmacy*   Lab Work: Fasting lipids - late jan or Feb  2022  Nothing to eat or drink the morning You may do Labs at [employer] of [address] office in [place]    If you have labs (blood work) drawn today and your tests are completely normal, you will receive your results only by: Marland Kitchen MyChart Message (if you have MyChart) OR . A paper copy in the mail If you have any lab test that is abnormal or we need to change your treatment, we will call you to review the results.   Testing/Procedures: Not needed   Follow-Up: At [facility], you and your health needs are our priority.  As part of our continuing mission to provide you with exceptional heart care, we have created designated Provider Care Teams.  These Care Teams include your primary Cardiologist (physician) and Advanced Practice Providers (APPs -  Physician Assistants and Nurse Practitioners) who all work together to provide you with the care you need, when you need it.  We recommend signing up for the patient portal called "MyChart".  Sign up information is provided on this After Visit Summary.  MyChart is used to connect with patients for Virtual Visits (Telemedicine).  Patients are able to view lab/test results, encounter notes, upcoming appointments, etc.  Non-urgent messages can be sent to your provider as well.   To learn more about what you can do with MyChart, go to [email or web address].    Your next appointment:   2 month(s)  The format for your next appointment:   In Person  Provider:   Kirtland Bouchard [place] Hilty, MD   Other Instructions Your physician recommends that you schedule a follow-up appointment in Dr Servando Salina

## 2020-03-04 ENCOUNTER — Other Ambulatory Visit: Payer: Self-pay

## 2020-03-05 ENCOUNTER — Ambulatory Visit: Payer: Medicare HMO | Admitting: Cardiology

## 2020-03-21 NOTE — Unmapped (Signed)
[facility] Clinical Assessment & Refill Coordination Note    Glen Gross, [place]: [birth year]  Phone: [phone] (home)     All above HIPAA information was verified with patient.     Was a Nurse, learning disability used for this call? No    Specialty Medication(s):   General Specialty: Repatha     Current Outpatient Medications   Medication Sig Dispense Refill   ??? acetaminophen (TYLENOL) 500 MG tablet Take 2 tablets (1,000 mg total) by mouth every six (6) hours as needed.  0   ??? aspirin (ECOTRIN) 81 MG tablet Take 1 tablet (81 mg total) by mouth daily. 30 tablet 0   ??? aspirin 81 MG chewable tablet Chew 1 tablet (81 mg total) daily. 108 tablet 3   ??? atorvastatin (LIPITOR) 40 MG tablet Take 1 tablet (40 mg total) by mouth daily. 30 tablet 0   ??? atorvastatin (LIPITOR) 80 MG tablet Take 1 tablet (80 mg total) by mouth daily. 90 tablet 3   ??? carvedilol (COREG) 6.25 MG tablet Take 1 tablet (6.25 mg total) by mouth Two (2) times a day. 60 tablet 0   ??? clopidogreL (PLAVIX) 75 mg tablet Take 1 tablet (75 mg total) by mouth daily. 90 tablet 3   ??? empty container Misc Use as directed to dispose of injectable medications 1 each 3   ??? evolocumab (REPATHA SURECLICK) 140 mg/mL PnIj Inject the contents of 1 pen (140 mg) under the skin every fourteen (14) days. 6 mL 3   ??? gabapentin (NEURONTIN) 300 MG capsule Take 1 capsule (300 mg total) by mouth Three (3) times a day. (Patient taking differently: Take 600 mg by mouth two (2) times a day. ) 60 capsule 0   ??? gabapentin (NEURONTIN) 300 MG capsule Take 600 mg by mouth Three (3) times a day.      ??? lisinopriL (PRINIVIL,ZESTRIL) 10 MG tablet Take 10 mg by mouth daily.     ??? methylPREDNISolone (MEDROL DOSEPACK) 4 mg tablet follow package directions 1 Package 0   ??? metoprolol succinate (TOPROL XL) 25 MG 24 hr tablet Take 1/2 tablet (12.5 mg) by mouth daily. 45 tablet 1   ??? naproxen (NAPROSYN) 500 MG tablet Take 1 tablet (500 mg total) by mouth 2 (two) times a day with meals. As needed for pain 30 tablet 0   ??? nicotine polacrilex (NICORETTE MINI) lozenge 4 MG Apply 1 lozenge (4 mg total) to cheek every 1-2 hours as needed for smoking cessation. 72 lozenge 3   ??? nicotine polacrilex (NICORETTE) 4 MG gum Apply 1 each (4 mg total) to cheek every hour as needed for smoking cessation. Weeks 1-6: Chew 1 piece of gum every 1-2 hours as needed 110 each 3   ??? oxyCODONE (ROXICODONE) 10 mg immediate release tablet Take 10 mg by mouth 4 (four) times a day as needed.     ??? tiZANidine (ZANAFLEX) 4 MG tablet Instructions 1/2-1 tablet up to every 8 hours as needed for muscle spasm (Patient not taking: Reported on 07/31/2018) 20 tablet 0     No current facility-administered medications for this visit.        Changes to medications: Glen Gross reports no changes at this time.    No Known Allergies    Changes to allergies: No    SPECIALTY MEDICATION ADHERENCE     Repatha 140 mg/ml: 0 days of medicine on hand       Medication Adherence    Patient reported X  missed doses in the last month: 0  Specialty Medication: Repatha 140 mg/mL  Informant: patient          Specialty medication(s) dose(s) confirmed: Regimen is correct and unchanged.     Are there any concerns with adherence? No. Glen Gross is requesting to fill a few weeks early. He confirms that he did received 6 pens on 01/17/20. He states he takes a dose every 14 days as instructed and all pens functioned correctly. He says he has no pens remaining and is due for next dose on 03/25/20. Insurance reports refill too soon until 03/26/20. Counseled Glen Gross that per the manufacturer, administer within 7 days from the missed dose and resume original schedule. He voices understanding and will plan to take dose on 03/27/20, then resume normal schedule.      Adherence counseling provided? Not needed    CLINICAL MANAGEMENT AND INTERVENTION      Clinical Benefit Assessment:    Do you feel the medicine is effective or helping your condition? Yes    Clinical Benefit counseling provided? Not needed    Adverse Effects Assessment:    Are you experiencing any side effects? No    Are you experiencing difficulty administering your medicine? No    Quality of Life Assessment:    How many days over the past month did your high cholesterol  keep you from your normal activities? For example, brushing your teeth or getting up in the morning. Patient declined to answer    Have you discussed this with your provider? Not needed    Therapy Appropriateness:    Is therapy appropriate? Yes, therapy is appropriate and should be continued    DISEASE/MEDICATION-SPECIFIC INFORMATION      For patients on injectable medications: Patient currently has 0 doses left.  Next injection is scheduled for 03/25/20.    PATIENT SPECIFIC NEEDS     - Does the patient have any physical, cognitive, or cultural barriers? No    - Is the patient high risk? No    - Does the patient require a Care Management Plan? No     - Does the patient require physician intervention or other additional services (i.e. nutrition, smoking cessation, social work)? No      SHIPPING     Specialty Medication(s) to be Shipped:   General Specialty: Repatha    Other medication(s) to be shipped: No additional medications requested for fill at this time     Changes to insurance: No    Delivery Scheduled: Yes, Expected medication delivery date: 03/27/20.     Medication will be delivered via UPS to the confirmed prescription address in [facility].    The patient will receive a drug information handout for each medication shipped and additional FDA Medication Guides as required.  Verified that patient has previously received a Conservation officer, historic buildings.    All of the patient's questions and concerns have been addressed.    Glen Gross   [facility] Specialty Pharmacist

## 2020-03-26 MED FILL — REPATHA SURECLICK 140 MG/ML SUBCUTANEOUS PEN INJECTOR: SUBCUTANEOUS | 84 days supply | Qty: 6 | Fill #2

## 2020-04-09 ENCOUNTER — Encounter (HOSPITAL_COMMUNITY): Payer: Medicare HMO

## 2020-04-09 ENCOUNTER — Other Ambulatory Visit (HOSPITAL_COMMUNITY): Payer: Medicare HMO

## 2020-04-09 ENCOUNTER — Ambulatory Visit: Payer: Medicare HMO | Admitting: Vascular Surgery

## 2020-04-21 ENCOUNTER — Telehealth: Payer: Medicare HMO | Admitting: Internal Medicine

## 2020-04-22 ENCOUNTER — Other Ambulatory Visit: Payer: Self-pay

## 2020-04-22 DIAGNOSIS — I739 Peripheral vascular disease, unspecified: Secondary | ICD-10-CM

## 2020-04-23 ENCOUNTER — Encounter (HOSPITAL_COMMUNITY): Payer: Medicare HMO

## 2020-04-23 ENCOUNTER — Other Ambulatory Visit (HOSPITAL_COMMUNITY): Payer: Medicare HMO

## 2020-04-23 ENCOUNTER — Ambulatory Visit: Payer: Medicare HMO | Admitting: Vascular Surgery

## 2020-05-07 ENCOUNTER — Ambulatory Visit: Payer: Medicare HMO | Admitting: Vascular Surgery

## 2020-05-07 ENCOUNTER — Ambulatory Visit (HOSPITAL_COMMUNITY): Payer: Medicare HMO | Attending: Vascular Surgery

## 2020-05-07 ENCOUNTER — Ambulatory Visit (HOSPITAL_COMMUNITY): Admission: RE | Admit: 2020-05-07 | Payer: Medicare HMO | Source: Ambulatory Visit

## 2020-05-12 ENCOUNTER — Telehealth: Payer: Self-pay | Admitting: Cardiology

## 2020-05-12 ENCOUNTER — Telehealth: Payer: Self-pay

## 2020-05-12 NOTE — Telephone Encounter (Signed)
Pt c/o of Chest Pain: STAT if CP now or developed within 24 hours  1. Are you having CP right now? No   2. Are you experiencing any other symptoms (ex. SOB, nausea, vomiting, sweating)? SOB , Sweating and Nausea   3. How long have you been experiencing CP?  2 month but progressively getting worse   4. Is your CP continuous or coming and going?  Continuous during the episodes   5. Have you taken Nitroglycerin? No   Pt c/o Shortness Of Breath: STAT if SOB developed within the last 24 hours or pt is noticeably SOB on the phone  1. Are you currently SOB (can you hear that pt is SOB on the phone)? No   2. How long have you been experiencing SOB? Last couple of month but progressively getting worse   3. Are you SOB when sitting or when up moving around? Both   4. Are you currently experiencing any other symptoms?  Lightheaded about 2 or 3 times a week   Best number [phone]   ?

## 2020-05-12 NOTE — Telephone Encounter (Signed)
Spoke with patient, added to Dr. Mallory Shirk schedule.

## 2020-05-12 NOTE — Telephone Encounter (Signed)
Spoke with patient. He is complaining of shortness of breath, chest constant pressure. He states last night he walked about 300 yards, and had to have his daughter come pick him up. After walking up the stairs he becomes very winded. "I have to sit down and catch my breath." He reports 1-2 times a week he becomes lightheaded, the pressure in his chest started 2-3 weeks ago, it has gotten worst over time. I spoke with Dr. Servando Salina patient placed on schedule for [facility] next week. It was stressed to patient if his symptoms get worst before he is able to come to his appointment he is to be seen at the emergency department. He verbalized understanding.

## 2020-05-20 ENCOUNTER — Other Ambulatory Visit: Payer: Self-pay

## 2020-05-21 ENCOUNTER — Ambulatory Visit: Payer: Medicare HMO | Admitting: Cardiology

## 2020-05-28 ENCOUNTER — Encounter: Payer: Self-pay | Admitting: Cardiology

## 2020-05-28 ENCOUNTER — Other Ambulatory Visit: Payer: Self-pay

## 2020-05-28 ENCOUNTER — Ambulatory Visit (INDEPENDENT_AMBULATORY_CARE_PROVIDER_SITE_OTHER): Payer: Medicare HMO | Admitting: Cardiology

## 2020-05-28 VITALS — BP 140/90 | HR 75 | Ht 73.0 in | Wt 199.8 lb

## 2020-05-28 DIAGNOSIS — I739 Peripheral vascular disease, unspecified: Secondary | ICD-10-CM | POA: Diagnosis not present

## 2020-05-28 DIAGNOSIS — F17200 Nicotine dependence, unspecified, uncomplicated: Secondary | ICD-10-CM | POA: Diagnosis not present

## 2020-05-28 DIAGNOSIS — I2510 Atherosclerotic heart disease of native coronary artery without angina pectoris: Secondary | ICD-10-CM

## 2020-05-28 DIAGNOSIS — I10 Essential (primary) hypertension: Secondary | ICD-10-CM | POA: Diagnosis not present

## 2020-05-28 DIAGNOSIS — Z951 Presence of aortocoronary bypass graft: Secondary | ICD-10-CM

## 2020-05-28 DIAGNOSIS — E782 Mixed hyperlipidemia: Secondary | ICD-10-CM

## 2020-05-28 MED ORDER — METOPROLOL SUCCINATE ER 25 MG PO TB24: 25.0000 mg | ORAL_TABLET | Freq: Every day | ORAL | 3 refills | Status: DC

## 2020-05-28 MED ORDER — RANOLAZINE ER 1000 MG PO TB12: 1000.0000 mg | ORAL_TABLET | Freq: Two times a day (BID) | ORAL | 3 refills | Status: DC

## 2020-05-28 MED ORDER — ISOSORBIDE MONONITRATE ER 30 MG PO TB24: 30.0000 mg | ORAL_TABLET | Freq: Every day | ORAL | 3 refills | Status: DC

## 2020-05-28 MED ORDER — PANTOPRAZOLE SODIUM 40 MG PO TBEC: 40.0000 mg | DELAYED_RELEASE_TABLET | Freq: Every day | ORAL | 11 refills | Status: DC

## 2020-05-28 NOTE — Progress Notes (Addendum)
Cardiology Office Note:    Date:  05/28/2020   ID:  Benjamin Espinoza, DOB Oct 20, 1965, MRN [id number]  PCP:  Teena Irani, PA-C  Cardiologist:  Benjamin Ripple, DO  Electrophysiologist:  None   Referring MD: Teena Irani, PA-C   Have been having some shortness of breath.  History of Present Illness:    Benjamin Espinoza is a 55 y.o. male with a hx of hx of coronary artery disease status post CABG in 2017 in [place], on April 24, 2019 the patient did have unstable angina was transferred to [facility] where he underwent a left heart catheterization showing all his grafts were patent, he recently underwent a work-up with nuclear stress test which was abnormal he was sent for cardiac catheterization which he did on November 26, 2019 at that time he was found to have 70% ostial lesion in his SVG to PDA and this was treated with a resolute Onyx stent, hypertension, hyperlipidemia, prediabetes, tobacco use and degenerative disc disease.  I saw the patient in November 29, 2019 at that time I recommended that he follow-up in 3 months unfortunately he did not.  He is here today for follow-up visit.  He tells me the last several weeks he has been experiencing some shortness of breath on exertion.    Since I last saw him he was seen at the emergency department at [facility] where he presented on May 24, 2020 at that time he complained of abdominal pain.  He had a chest x-ray as well as an abdominal x-ray which was reported to be normal.   Past Medical History:  Diagnosis Date  . Abnormal stress test   . Anxiety   . Asthma    as a child  . Atypical chest pain 05/07/2017  . Coronary artery disease   . DDD (degenerative disc disease), lumbar 11/08/2019   Formatting of this note might be different from the original. [facility]  . Essential hypertension   . 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 Hyperlipidemia   . Myocardial infarction (HCC) 2017  . PVD (peripheral vascular  disease) (HCC) 09/21/2019  . Shortness of breath   . Tobacco use disorder     Past Surgical History:  Procedure Laterality Date  . ABDOMINAL AORTOGRAM W/LOWER EXTREMITY Bilateral 09/21/2019   Procedure: ABDOMINAL AORTOGRAM W/LOWER EXTREMITY;  Surgeon: [name], MD;  Location: [facility] INVASIVE CV LAB;  Service: Cardiovascular;  Laterality: Bilateral;  . ABDOMINAL AORTOGRAM W/LOWER EXTREMITY N/A 12/07/2019   Procedure: ABDOMINAL AORTOGRAM W/LOWER EXTREMITY;  Surgeon: [name], MD;  Location: [facility] INVASIVE CV LAB;  Service: Cardiovascular;  Laterality: N/A;  . CARDIAC CATHETERIZATION    . CORONARY ARTERY BYPASS GRAFT     quadruple bypas 2017  . CORONARY STENT INTERVENTION N/A 11/26/2019   Procedure: CORONARY STENT INTERVENTION;  Surgeon: Yvonne Kendall, MD;  Location: [facility] INVASIVE CV LAB;  Service: Cardiovascular;  Laterality: N/A;  . FEMORAL-POPLITEAL BYPASS GRAFT Right 12/25/2019   Procedure: BYPASS GRAFT FEMORAL- BELOW KNEE POPLITEAL ARTERY RIGHT USING GORE PROPATEN VASCULAR GRAFT;  Surgeon: [name], MD;  Location: [facility] OR;  Service: Vascular;  Laterality: Right;  . LEFT HEART CATH AND CORS/GRAFTS ANGIOGRAPHY N/A 11/26/2019   Procedure: LEFT HEART CATH AND CORS/GRAFTS ANGIOGRAPHY;  Surgeon: Yvonne Kendall, MD;  Location: [facility] INVASIVE CV LAB;  Service: Cardiovascular;  Laterality: N/A;  . LOWER EXTREMITY ANGIOGRAM Right 12/25/2019   Procedure: RIGHT LOWER EXTREMITY ANGIOGRAM;  Surgeon: [name],  MD;  Location: [facility] OR;  Service: Vascular;  Laterality: Right;  . PERIPHERAL VASCULAR BALLOON ANGIOPLASTY Left 12/07/2019   Procedure: PERIPHERAL VASCULAR BALLOON ANGIOPLASTY;  Surgeon: [name], MD;  Location[facility] INVASIVE CV LAB;  Service: Cardiovascular;  Laterality: Left;  SFA    Current Medications: Current Meds  Medication Sig  . aspirin EC 81 MG tablet Take 2 tablets (162 mg total) by mouth daily.  . cilostazol (PLETAL) 100 MG  tablet Take 100 mg by mouth daily.   . clopidogrel (PLAVIX) 75 MG tablet Take 1 tablet (75 mg total) by mouth daily.  Marland Kitchen gabapentin (NEURONTIN) 600 MG tablet Take 600 mg by mouth 2 (two) times daily as needed (Pain).   . isosorbide mononitrate (IMDUR) 30 MG 24 hr tablet Take 1 tablet (30 mg total) by mouth daily.  Marland Kitchen lisinopril (ZESTRIL) 10 MG tablet Take 10 mg by mouth daily.  Marland Kitchen losartan (COZAAR) 25 MG tablet Take 1 tablet (25 mg total) by mouth daily.  . metoprolol succinate (TOPROL XL) 25 MG 24 hr tablet Take 1 tablet (25 mg total) by mouth daily.  . naproxen (NAPROSYN) 500 MG tablet Take 500 mg by mouth 2 (two) times daily with a meal.  . Oxycodone HCl 10 MG TABS Take 10 mg by mouth in the morning, at noon, in the evening, and at bedtime.  Marland Kitchen oxyCODONE-acetaminophen (PERCOCET) 5-325 MG tablet Take 1-2 tablets by mouth every 6 (six) hours as needed for severe pain.  Marland Kitchen oxyCODONE-acetaminophen (PERCOCET/ROXICET) 5-325 MG tablet Take 1 tablet by mouth every 4 (four) hours as needed for severe pain.  . pantoprazole (PROTONIX) 40 MG tablet Take 1 tablet (40 mg total) by mouth daily.  . ranolazine (RANEXA) 1000 MG SR tablet Take 1 tablet (1,000 mg total) by mouth 2 (two) times daily.  Marland Kitchen REPATHA SURECLICK 140 MG/ML SOAJ Inject the contents of 1 pen (140 mg) under the skin every fourteen (14) days.  . [DISCONTINUED] atorvastatin (LIPITOR) 10 MG tablet Take 10 mg by mouth daily.  . [DISCONTINUED] metoprolol succinate (TOPROL-XL) 25 MG 24 hr tablet Take 12.5 mg by mouth daily.  . [DISCONTINUED] ranolazine (RANEXA) 500 MG 12 hr tablet Take 1 tablet (500 mg total) by mouth 2 (two) times daily.   Current Facility-Administered Medications for the 05/28/20 encounter (Office Visit) with Benjamin Ripple, DO  Medication  . sodium chloride flush (NS) 0.9 % injection 3 mL     Allergies:   Patient has no known allergies.   Social History   Socioeconomic History  . Marital status: Married    Spouse name: Not on  file  . Number of children: Not on file  . Years of education: Not on file  . Highest education level: Not on file  Occupational History  . Not on file  Tobacco Use  . Smoking status: Current Every Day Smoker    Years: 25.00    Types: Cigars, Cigarettes  . Smokeless tobacco: Never Used  . Tobacco comment: as of 12/20/19 2 cigararettes/day  Vaping Use  . Vaping Use: Never used  Substance and Sexual Activity  . Alcohol use: No  . Drug use: No  . Sexual activity: Not on file  Other Topics Concern  . Not on file  Social History Narrative  . Not on file   Social Determinants of Health   Financial Resource Strain: Not on file  Food Insecurity: Not on file  Transportation Needs: Not on file  Physical Activity: Not on file  Stress:  Not on file  Social Connections: Not on file     Family History: The patient's family history includes Heart disease in his father, mother, and sister.  ROS:   Review of Systems  Constitution: Negative for decreased appetite, fever and weight gain.  HENT: Negative for congestion, ear discharge, hoarse voice and sore throat.   Eyes: Negative for discharge, redness, vision loss in right eye and visual halos.  Cardiovascular: Negative for chest pain, dyspnea on exertion, leg swelling, orthopnea and palpitations.  Respiratory: Negative for cough, hemoptysis, shortness of breath and snoring.   Endocrine: Negative for heat intolerance and polyphagia.  Hematologic/Lymphatic: Negative for bleeding problem. Does not bruise/bleed easily.  Skin: Negative for flushing, nail changes, rash and suspicious lesions.  Musculoskeletal: Negative for arthritis, joint pain, muscle cramps, myalgias, neck pain and stiffness.  Gastrointestinal: Negative for abdominal pain, bowel incontinence, diarrhea and excessive appetite.  Genitourinary: Negative for decreased libido, genital sores and incomplete emptying.  Neurological: Negative for brief paralysis, focal weakness,  headaches and loss of balance.  Psychiatric/Behavioral: Negative for altered mental status, depression and suicidal ideas.  Allergic/Immunologic: Negative for HIV exposure and persistent infections.    EKGs/Labs/Other Studies Reviewed:    The following studies were reviewed today:   EKG:  The ekg which was done on May 24, 2020 showed evidence of sinus rhythm and left atrial enlargement no significant ST segment changes.  Heart catheterization done in September 2021 Conclusions: 1. Severe native coronary artery disease including 50% ostial LMCA stenosis, chronic total occlusions of the ostial, mid, and distal LAD, 80% stenosis of mid LCx as well as chronic total occlusion of OM1, and chronic total occlusion of the proximal through distal RCA.  There is also a 90% stenosis in the RPDA distal to the SVG anastomosis. 2. Widely patent LIMA to LAD, supplying a relatively small territory as the distal LAD is chronically occluded. 3. Patent SVG to OM1 with 30% ostial/proximal stenosis. 4. Widely patent SVG to OM 2. 5. Patent SVG to RPDA with 70% ostial/proximal disease as well as 90% stenosis at the distal anastomosis extending into the RPDA. 6. Normal left ventricular filling pressure with mildly to moderately reduced systolic function (LVEF ~45%). 7. Successful PCI to distal anastomosis of SVG to RPDA extending into the RPDA with a Resolute Onyx 2.25 x 18 mm drug-eluting stent (postdilated to 2.9 mm proximally) with 0% residual stenosis and TIMI-3 flow. 8. Successful PCI to ostial/proximal SVG to RPDA using Resolute Onyx 2.75 x 18 mm drug-eluting stent with 0% residual stenosis and TIMI-3 flow.  Recommendations: 1. Continue dual antiplatelet therapy with aspirin and clopidogrel for at least 6 months, ideally longer. 2. Continue aggressive secondary prevention as well as antianginal therapy with ranolazine. 3. Anticipate same-day discharge this afternoon if no post catheterization  complications.   Echocardiogram Study Conclusions  - Left ventricle: The cavity size was normal. There was mild concentric hypertrophy. Systolic function was normal. The estimated ejection fraction was in the range of 50% to 55%. Wall motion was normal; there were no regional wall motion abnormalities. Left ventricular diastolic function parameters were normal.  - Aorta: Aortic root dimension: 43 mm (ED).   Recent Labs: 12/20/2019: ALT 20 12/26/2019: BUN 11; Creatinine, Ser 1.04; Hemoglobin 12.4; Platelets 296; Potassium 3.9; Sodium 136  Recent Lipid Panel    Component Value Date/Time   CHOL 128 12/26/2019 0417   TRIG 62 12/26/2019 0417   HDL 31 (L) 12/26/2019 0417   CHOLHDL 4.1 12/26/2019 0417  VLDL 12 12/26/2019 0417   LDLCALC 85 12/26/2019 0417    Physical Exam:    VS:  BP 140/90   Pulse 75   Ht 6\' 1"  (1.854 m)   Wt 199 lb 12.8 oz (90.6 kg)   SpO2 96%   BMI 26.36 kg/m     Wt Readings from Last 3 Encounters:  05/28/20 199 lb 12.8 oz (90.6 kg)  02/21/20 200 lb (90.7 kg)  01/09/20 190 lb (86.2 kg)     GEN: Well nourished, well developed in no acute distress HEENT: Normal NECK: No JVD; No carotid bruits LYMPHATICS: No lymphadenopathy CARDIAC: S1S2 noted,RRR, no murmurs, rubs, gallops RESPIRATORY:  Clear to auscultation without rales, wheezing or rhonchi  ABDOMEN: Soft, non-tender, non-distended, +bowel sounds, no guarding. EXTREMITIES: No edema, No cyanosis, no clubbing MUSCULOSKELETAL:  No deformity  SKIN: Warm and dry NEUROLOGIC:  Alert and oriented x 3, non-focal PSYCHIATRIC:  Normal affect, good insight  ASSESSMENT:    1. Coronary artery disease involving native coronary artery of native heart with angina pectoris   2. Smoker   3. PVD (peripheral vascular disease) (HCC)   4. Essential hypertension   5. Hx of CABG   6. Mixed hyperlipidemia   7. Tobacco use disorder    PLAN:    In light of his shortness of breath and his known history of  coronary artery disease I am suspecting this may be angina equivalent so I elected to his optimize his antianginals we will increase his Ranexa to 1000 mg twice daily, start Imdur 30 mg daily and increase his metoprolol to 25 mg daily.  He will remain on his dual antiplatelet therapy with aspirin Plavix.  He takes Repatha 140 every 2 weeks and Lipitor 20 mg daily.  I am hoping that to massing his antianginal will help with his symptoms.  I will see the patient back in 2 weeks and if his symptoms does not improve I plan to send him for heart catheterization.  He still smokes.  I have asked the patient to really quit smoking and he needs help with this so we will go to refer him to our smoking cessation program at [facility].  His blood pressure is also elevated his goal is less than 130/80.  I am hoping that increasing his metoprolol as well as adding Imdur will help with his blood pressure control as well.  He also tells me he has been experiencing some gastroesophageal reflux would not start patient on Protonix  The patient is in agreement with the above plan. The patient left the office in stable condition.  The patient will follow up in 2 weeks   Medication Adjustments/Labs and Tests Ordered: Current medicines are reviewed at length with the patient today.  Concerns regarding medicines are outlined above.  Orders Placed This Encounter  Procedures  . Ambulatory referral to Smoking Cessation Program   Meds ordered this encounter  Medications  . isosorbide mononitrate (IMDUR) 30 MG 24 hr tablet    Sig: Take 1 tablet (30 mg total) by mouth daily.    Dispense:  90 tablet    Refill:  3  . ranolazine (RANEXA) 1000 MG SR tablet    Sig: Take 1 tablet (1,000 mg total) by mouth 2 (two) times daily.    Dispense:  90 tablet    Refill:  3  . pantoprazole (PROTONIX) 40 MG tablet    Sig: Take 1 tablet (40 mg total) by mouth daily.    Dispense:  30 tablet    Refill:  11  . metoprolol  succinate (TOPROL XL) 25 MG 24 hr tablet    Sig: Take 1 tablet (25 mg total) by mouth daily.    Dispense:  90 tablet    Refill:  3    Patient Instructions  Medication Instructions:  Your physician has recommended you make the following change in your medication: START: Imdur 30 mg once daily INCREASE: Ranexa 100 mg twice daily INCREASE: Toprol 25 mg daily START: Protonix 40 mg daily *If you need a refill on your cardiac medications before your next appointment, please call your pharmacy*   Lab Work: None If you have labs (blood work) drawn today and your tests are completely normal, you will receive your results only by: Marland Kitchen MyChart Message (if you have MyChart) OR . A paper copy in the mail If you have any lab test that is abnormal or we need to change your treatment, we will call you to review the results.   Testing/Procedures: None   Follow-Up: At [facility], you and your health needs are our priority.  As part of our continuing mission to provide you with exceptional heart care, we have created designated Provider Care Teams.  These Care Teams include your primary Cardiologist (physician) and Advanced Practice Providers (APPs -  Physician Assistants and Nurse Practitioners) who all work together to provide you with the care you need, when you need it.  We recommend signing up for the patient portal called "MyChart".  Sign up information is provided on this After Visit Summary.  MyChart is used to connect with patients for Virtual Visits (Telemedicine).  Patients are able to view lab/test results, encounter notes, upcoming appointments, etc.  Non-urgent messages can be sent to your provider as well.   To learn more about what you can do with MyChart, go to [email or web address].    Your next appointment:   2 week(s)  The format for your next appointment:   In Person  Provider:   Thomasene Ripple, DO   Other Instructions      Adopting a Healthy Lifestyle.  Know  what a healthy weight is for you (roughly BMI <25) and aim to maintain this   Aim for 7+ servings of fruits and vegetables daily   65-80+ fluid ounces of water or unsweet tea for healthy kidneys   Limit to max 1 drink of alcohol per day; avoid smoking/tobacco   Limit animal fats in diet for cholesterol and heart health - choose grass fed whenever available   Avoid highly processed foods, and foods high in saturated/trans fats   Aim for low stress - take time to unwind and care for your mental health   Aim for 150 min of moderate intensity exercise weekly for heart health, and weights twice weekly for bone health   Aim for 7-9 hours of sleep daily   When it comes to diets, agreement about the perfect plan isnt easy to find, even among the experts. Experts at the [facility] [employer] developed an idea known as the Healthy Eating Plate. Just imagine a plate divided into logical, healthy portions.   The emphasis is on diet quality:   Load up on vegetables and fruits - one-half of your plate: Aim for color and variety, and remember that potatoes dont count.   Go for whole grains - one-quarter of your plate: Whole wheat, barley, wheat berries, quinoa, oats, brown rice, and foods made with them. If you want pasta,  go with whole wheat pasta.   Protein power - one-quarter of your plate: Fish, chicken, beans, and nuts are all healthy, versatile protein sources. Limit red meat.   The diet, however, does go beyond the plate, offering a few other suggestions.   Use healthy plant oils, such as olive, canola, soy, corn, sunflower and peanut. Check the labels, and avoid partially hydrogenated oil, which have unhealthy trans fats.   If youre thirsty, drink water. Coffee and tea are good in moderation, but skip sugary drinks and limit milk and dairy products to one or two daily servings.   The type of carbohydrate in the diet is more important than the amount. Some sources of  carbohydrates, such as vegetables, fruits, whole grains, and beans-are healthier than others.   Finally, stay active  Signed, Benjamin RippleKardie Kala Ambriz, DO  05/28/2020 2:28 PM    [facility]

## 2020-05-28 NOTE — Patient Instructions (Signed)
Medication Instructions:  Your physician has recommended you make the following change in your medication: START: Imdur 30 mg once daily INCREASE: Ranexa 100 mg twice daily INCREASE: Toprol 25 mg daily START: Protonix 40 mg daily *If you need a refill on your cardiac medications before your next appointment, please call your pharmacy*   Lab Work: None If you have labs (blood work) drawn today and your tests are completely normal, you will receive your results only by: Marland Kitchen MyChart Message (if you have MyChart) OR . A paper copy in the mail If you have any lab test that is abnormal or we need to change your treatment, we will call you to review the results.   Testing/Procedures: None   Follow-Up: At [facility], you and your health needs are our priority.  As part of our continuing mission to provide you with exceptional heart care, we have created designated Provider Care Teams.  These Care Teams include your primary Cardiologist (physician) and Advanced Practice Providers (APPs -  Physician Assistants and Nurse Practitioners) who all work together to provide you with the care you need, when you need it.  We recommend signing up for the patient portal called "MyChart".  Sign up information is provided on this After Visit Summary.  MyChart is used to connect with patients for Virtual Visits (Telemedicine).  Patients are able to view lab/test results, encounter notes, upcoming appointments, etc.  Non-urgent messages can be sent to your provider as well.   To learn more about what you can do with MyChart, go to [email or web address].    Your next appointment:   2 week(s)  The format for your next appointment:   In Person  Provider:   Thomasene Ripple, DO   Other Instructions

## 2020-06-02 ENCOUNTER — Telehealth: Payer: Self-pay | Admitting: Cardiology

## 2020-06-02 NOTE — Telephone Encounter (Signed)
Patient would like to know if he can pick up his medical records at the office. He states he needs to show his heart condition in order to be in a program.

## 2020-06-02 NOTE — Telephone Encounter (Signed)
Left message for patient to return call.

## 2020-06-04 NOTE — Unmapped (Signed)
[facility] Clinical Assessment & Refill Coordination Note    Glen Gross, [place]: [birth year]  Phone: [phone] (home)     All above HIPAA information was verified with patient.     Was a Nurse, learning disability used for this call? No    Specialty Medication(s):   General Specialty: Repatha     Current Outpatient Medications   Medication Sig Dispense Refill   ??? acetaminophen (TYLENOL) 500 MG tablet Take 2 tablets (1,000 mg total) by mouth every six (6) hours as needed.  0   ??? aspirin (ECOTRIN) 81 MG tablet Take 1 tablet (81 mg total) by mouth daily. 30 tablet 0   ??? aspirin 81 MG chewable tablet Chew 1 tablet (81 mg total) daily. 108 tablet 3   ??? atorvastatin (LIPITOR) 40 MG tablet Take 1 tablet (40 mg total) by mouth daily. 30 tablet 0   ??? atorvastatin (LIPITOR) 80 MG tablet Take 1 tablet (80 mg total) by mouth daily. 90 tablet 3   ??? carvedilol (COREG) 6.25 MG tablet Take 1 tablet (6.25 mg total) by mouth Two (2) times a day. 60 tablet 0   ??? clopidogreL (PLAVIX) 75 mg tablet Take 1 tablet (75 mg total) by mouth daily. 90 tablet 3   ??? empty container Misc Use as directed to dispose of injectable medications 1 each 3   ??? evolocumab (REPATHA SURECLICK) 140 mg/mL PnIj Inject the contents of 1 pen (140 mg) under the skin every fourteen (14) days. 6 mL 3   ??? gabapentin (NEURONTIN) 300 MG capsule Take 1 capsule (300 mg total) by mouth Three (3) times a day. (Patient taking differently: Take 600 mg by mouth two (2) times a day. ) 60 capsule 0   ??? gabapentin (NEURONTIN) 300 MG capsule Take 600 mg by mouth Three (3) times a day.      ??? lisinopriL (PRINIVIL,ZESTRIL) 10 MG tablet Take 10 mg by mouth daily.     ??? methylPREDNISolone (MEDROL DOSEPACK) 4 mg tablet follow package directions 1 Package 0   ??? metoprolol succinate (TOPROL XL) 25 MG 24 hr tablet Take 1/2 tablet (12.5 mg) by mouth daily. 45 tablet 1   ??? naproxen (NAPROSYN) 500 MG tablet Take 1 tablet (500 mg total) by mouth 2 (two) times a day with meals. As needed for pain 30 tablet 0   ??? nicotine polacrilex (NICORETTE MINI) lozenge 4 MG Apply 1 lozenge (4 mg total) to cheek every 1-2 hours as needed for smoking cessation. 72 lozenge 3   ??? nicotine polacrilex (NICORETTE) 4 MG gum Apply 1 each (4 mg total) to cheek every hour as needed for smoking cessation. Weeks 1-6: Chew 1 piece of gum every 1-2 hours as needed 110 each 3   ??? oxyCODONE (ROXICODONE) 10 mg immediate release tablet Take 10 mg by mouth 4 (four) times a day as needed.     ??? tiZANidine (ZANAFLEX) 4 MG tablet Instructions 1/2-1 tablet up to every 8 hours as needed for muscle spasm (Patient not taking: Reported on 07/31/2018) 20 tablet 0     No current facility-administered medications for this visit.        Changes to medications: Glen Gross reports starting the following medications: Ranolazine daily and Nitroglycerin PRN    No Known Allergies    Changes to allergies: No    SPECIALTY MEDICATION ADHERENCE     Repatha 140 mg/ml: 0 days of medicine on hand       Medication Adherence  Patient reported X missed doses in the last month: 0  Specialty Medication: Repatha 140 mg/mL  Informant: patient          Specialty medication(s) dose(s) confirmed: Regimen is correct and unchanged.     Are there any concerns with adherence? Yes: In January, Glen Gross requested an early fill of medication stating he was out of Repatha and overdue for his next dose. At that time, I did extensive counseling of medication use. Glen Gross assured me he received 6 pens on previous fill and used 1 pen every 14 days as prescribed. He states all pens functioned properly. Insurance flagged rx as too soon to fill and we scheduled to ship prescription at first available date on 03/26/20 for an 84 day supply. We discussed missed dose directions and Glen Shorey planned to take dose when it arrived on 03/27/20.    Glen Gross called [facility] today again requesting an early fill of medication stating he was due for dose on 06/03/20 but is out of Repatha.     Adherence counseling provided? Yes: Glen Grabe confirmed he did receive 6 Repatha pens on 03/27/20. He reports he took a dose every 14 days and used his last pen on 3/22. He does not keep track of the dates when doses were given but states he does it every other Tuesday. He reports all of his pens functioned properly.    I counted out days and doses with him. He should have taken pen 1 on 03/27/20 when it arrived. Resuming his normal Tuesday schedule, he should have taken pen 2 on 2/8, pen 3 on 2/22, pen 4 on 3/8, pen 5 on 3/22, pen 6 on 4/5. Glen Gross again states he takes dose every other Tuesday and is out of pens. He says all he knows is that he used his last one 2 weeks ago.     It is unclear how/why Glen Gross is using doses but something is incorrect. I explained why it seems he is using doses incorrectly and strongly encouraged him to write dates in a clearly visible place (calendar, Repatha box, note on the fridge, etc) to help him keep track of when doses are due. He voices understanding and states he will do that going forward.    I reviewed missed dose instructions with him. Delivery is scheduled for Tuesday, 4/12. He will plan to take dose on 4/12 and start new q14 day schedule from there.     CLINICAL MANAGEMENT AND INTERVENTION      Clinical Benefit Assessment:    Do you feel the medicine is effective or helping your condition? Yes    Clinical Benefit counseling provided? Not needed    Adverse Effects Assessment:    Are you experiencing any side effects? No    Are you experiencing difficulty administering your medicine? No    Quality of Life Assessment:    How many days over the past month did your hyperlipidemia  keep you from your normal activities? For example, brushing your teeth or getting up in the morning. Patient declined to answer    Have you discussed this with your provider? Not needed    Acute Infection Status:    Acute infections noted within Epic:  No active infections  Patient reported infection: None    Therapy Appropriateness:    Is therapy appropriate? Yes, therapy is appropriate and should be continued    DISEASE/MEDICATION-SPECIFIC INFORMATION      For patients on injectable medications: Patient currently  has 0 doses left.  Next injection is scheduled for 06/10/20 (dose was due on 06/03/20).    PATIENT SPECIFIC NEEDS     - Does the patient have any physical, cognitive, or cultural barriers? No    - Is the patient high risk? No    - Does the patient require a Care Management Plan? No     - Does the patient require physician intervention or other additional services (i.e. nutrition, smoking cessation, social work)? No      SHIPPING     Specialty Medication(s) to be Shipped:   General Specialty: Repatha    Other medication(s) to be shipped: No additional medications requested for fill at this time     Changes to insurance: No    Delivery Scheduled: Yes, Expected medication delivery date: 06/10/20.     Medication will be delivered via UPS to the confirmed prescription address in [facility].    The patient will receive a drug information handout for each medication shipped and additional FDA Medication Guides as required.  Verified that patient has previously received a Conservation officer, historic buildings and a Surveyor, mining.    All of the patient's questions and concerns have been addressed.    [name]   [facility] Shared [facility] Specialty Pharmacist

## 2020-06-05 NOTE — Telephone Encounter (Signed)
Left message for patient to return our call.

## 2020-06-09 MED FILL — REPATHA SURECLICK 140 MG/ML SUBCUTANEOUS PEN INJECTOR: SUBCUTANEOUS | 84 days supply | Qty: 6 | Fill #3

## 2020-06-12 ENCOUNTER — Ambulatory Visit: Payer: Medicare HMO | Admitting: Cardiology

## 2020-08-14 ENCOUNTER — Ambulatory Visit: Payer: Medicare HMO | Admitting: Cardiology

## 2020-08-18 ENCOUNTER — Other Ambulatory Visit: Payer: Self-pay | Admitting: Cardiology

## 2020-08-18 DIAGNOSIS — E785 Hyperlipidemia, unspecified: Secondary | ICD-10-CM

## 2020-08-18 MED ORDER — REPATHA SURECLICK 140 MG/ML SUBCUTANEOUS PEN INJECTOR
3 refills | 0 days
Start: 2020-08-18 — End: ?

## 2020-08-18 MED ORDER — EVOLOCUMAB 140 MG/ML SUBCUTANEOUS PEN INJECTOR
SUBCUTANEOUS | 3 refills | 84 days
Start: 2020-08-18 — End: ?

## 2020-08-18 NOTE — Unmapped (Signed)
[facility] Clinical Assessment & Refill Coordination Note    Glen Gross, [place]: Jan 20, 1966  Phone: [phone] (home)     All above HIPAA information was verified with patient.     Was a Nurse, learning disability used for this call? No    Specialty Medication(s):   General Specialty: Repatha     Current Outpatient Medications   Medication Sig Dispense Refill   ??? acetaminophen (TYLENOL) 500 MG tablet Take 2 tablets (1,000 mg total) by mouth every six (6) hours as needed.  0   ??? aspirin (ECOTRIN) 81 MG tablet Take 1 tablet (81 mg total) by mouth daily. 30 tablet 0   ??? aspirin 81 MG chewable tablet Chew 1 tablet (81 mg total) daily. 108 tablet 3   ??? atorvastatin (LIPITOR) 40 MG tablet Take 1 tablet (40 mg total) by mouth daily. 30 tablet 0   ??? atorvastatin (LIPITOR) 80 MG tablet Take 1 tablet (80 mg total) by mouth daily. 90 tablet 3   ??? carvedilol (COREG) 6.25 MG tablet Take 1 tablet (6.25 mg total) by mouth Two (2) times a day. 60 tablet 0   ??? clopidogreL (PLAVIX) 75 mg tablet Take 1 tablet (75 mg total) by mouth daily. 90 tablet 3   ??? empty container Misc Use as directed to dispose of injectable medications 1 each 3   ??? evolocumab (REPATHA SURECLICK) 140 mg/mL PnIj Inject the contents of 1 pen (140 mg) under the skin every fourteen (14) days. 6 mL 3   ??? gabapentin (NEURONTIN) 300 MG capsule Take 1 capsule (300 mg total) by mouth Three (3) times a day. (Patient taking differently: Take 600 mg by mouth two (2) times a day. ) 60 capsule 0   ??? gabapentin (NEURONTIN) 300 MG capsule Take 600 mg by mouth Three (3) times a day.      ??? lisinopriL (PRINIVIL,ZESTRIL) 10 MG tablet Take 10 mg by mouth daily.     ??? methylPREDNISolone (MEDROL DOSEPACK) 4 mg tablet follow package directions 1 Package 0   ??? metoprolol succinate (TOPROL XL) 25 MG 24 hr tablet Take 1/2 tablet (12.5 mg) by mouth daily. 45 tablet 1   ??? naproxen (NAPROSYN) 500 MG tablet Take 1 tablet (500 mg total) by mouth 2 (two) times a day with meals. As needed for pain 30 tablet 0   ??? nicotine polacrilex (NICORETTE MINI) lozenge 4 MG Apply 1 lozenge (4 mg total) to cheek every 1-2 hours as needed for smoking cessation. 72 lozenge 3   ??? nicotine polacrilex (NICORETTE) 4 MG gum Apply 1 each (4 mg total) to cheek every hour as needed for smoking cessation. Weeks 1-6: Chew 1 piece of gum every 1-2 hours as needed 110 each 3   ??? nitroglycerin (NITROSTAT) 0.4 MG SL tablet Place 0.4 mg under the tongue every five (5) minutes as needed for chest pain. Maximum of 3 doses in 15 minutes.     ??? oxyCODONE (ROXICODONE) 10 mg immediate release tablet Take 10 mg by mouth 4 (four) times a day as needed.     ??? RANOLAZINE ORAL Take by mouth.     ??? tiZANidine (ZANAFLEX) 4 MG tablet Instructions 1/2-1 tablet up to every 8 hours as needed for muscle spasm (Patient not taking: Reported on 07/31/2018) 20 tablet 0     No current facility-administered medications for this visit.        Changes to medications: Blaise reports starting the following medications: isosorbide (He cannot remember  form or strength)    No Known Allergies    Changes to allergies: No    SPECIALTY MEDICATION ADHERENCE     Repatha 140 mg/ml: 19 days of medicine on hand     Medication Adherence    Patient reported X missed doses in the last month: 0  Specialty Medication: Repatha 140mg /ml  Patient is on additional specialty medications: No  Patient is on more than two specialty medications: No  Informant: patient  Reliability of informant: reliable  Reasons for non-adherence: no problems identified          Specialty medication(s) dose(s) confirmed: Regimen is correct and unchanged.     Are there any concerns with adherence? No    Adherence counseling provided? Not needed    CLINICAL MANAGEMENT AND INTERVENTION      Clinical Benefit Assessment:    Do you feel the medicine is effective or helping your condition? Yes    Clinical Benefit counseling provided? Not needed    Adverse Effects Assessment:    Are you experiencing any side effects? No    Are you experiencing difficulty administering your medicine? No    Quality of Life Assessment:    How many days over the past month did your hyperlipidemia  keep you from your normal activities? For example, brushing your teeth or getting up in the morning. Patient declined to answer    Have you discussed this with your provider? Not needed    Acute Infection Status:    Acute infections noted within Epic:  No active infections  Patient reported infection: None    Therapy Appropriateness:    Is therapy appropriate? Yes, therapy is appropriate and should be continued    DISEASE/MEDICATION-SPECIFIC INFORMATION      For patients on injectable medications: Patient currently has 1 doses left.  Next injection is scheduled for 08/26/20.    PATIENT SPECIFIC NEEDS     - Does the patient have any physical, cognitive, or cultural barriers? No    - Is the patient high risk? No    - Does the patient require a Care Management Plan? No     - Does the patient require physician intervention or other additional services (i.e. nutrition, smoking cessation, social work)? No      SHIPPING     Specialty Medication(s) to be Shipped:   General Specialty: Repatha    Other medication(s) to be shipped: No additional medications requested for fill at this time     Changes to insurance: No    Delivery Scheduled: Yes, Expected medication delivery date: 09/03/20.  However, Rx request for refills was sent to the provider as there are none remaining.     Medication will be delivered via UPS to the confirmed prescription address in [facility].    The patient will receive a drug information handout for each medication shipped and additional FDA Medication Guides as required.  Verified that patient has previously received a Conservation officer, historic buildings and a Surveyor, mining.    The patient or caregiver noted above participated in the development of this care plan and knows that they can request review of or adjustments to the care plan at any time.      All of the patient's questions and concerns have been addressed.    [name]   [facility] Shared [facility] Specialty Pharmacist

## 2020-08-18 NOTE — Telephone Encounter (Signed)
Refill sent to pharmacy.   

## 2020-09-02 MED FILL — REPATHA SURECLICK 140 MG/ML SUBCUTANEOUS PEN INJECTOR: 84 days supply | Qty: 6 | Fill #0

## 2020-11-11 NOTE — Unmapped (Signed)
[facility] Refill Coordination Note    Specialty Medication(s) to be Shipped:   General Specialty: Repatha    Other medication(s) to be shipped: No additional medications requested for fill at this time     Glen Gross, DOB: May 19, 1965  Phone: [phone] (home)       All above HIPAA information was verified with patient.     Was a Nurse, learning disability used for this call? No    Completed refill call assessment today to schedule patient's medication shipment from the [facility] [phone]).  All relevant notes have been reviewed.     Specialty medication(s) and dose(s) confirmed: Regimen is correct and unchanged.   Changes to medications: Mosi reports no changes at this time.  Changes to insurance: No  New side effects reported not previously addressed with a pharmacist or physician: None reported  Questions for the pharmacist: No    Confirmed patient received a Conservation officer, historic buildings and a Surveyor, mining with first shipment. The patient will receive a drug information handout for each medication shipped and additional FDA Medication Guides as required.       DISEASE/MEDICATION-SPECIFIC INFORMATION        For patients on injectable medications: Patient currently has 1 doses left.  Next injection is scheduled for 11/11/20.    SPECIALTY MEDICATION ADHERENCE     Medication Adherence    Patient reported X missed doses in the last month: 0  Specialty Medication: Repatha 140mg /ml  Patient is on additional specialty medications: No  Patient is on more than two specialty medications: No              Were doses missed due to medication being on hold? No    Repatha 140 mg/ml: 1 days of medicine on hand       REFERRAL TO PHARMACIST     Referral to the pharmacist: Not needed      [facility]     Shipping address confirmed in Epic.     Delivery Scheduled: Yes, Expected medication delivery date: 11/21/20.     Medication will be delivered via UPS to the prescription address in Epic [facility].    Nancy Nordmann [facility] Specialty Technician

## 2020-11-20 DIAGNOSIS — E785 Hyperlipidemia, unspecified: Principal | ICD-10-CM

## 2020-11-20 NOTE — Unmapped (Signed)
Prestin Munch 's REPATHA shipment will be delayed as a result of prior authorization being required by the patient's insurance.     I have reached out to the patient  at [phone] and communicated the delay. We will call the patient back to reschedule the delivery upon resolution. We have not confirmed the new delivery date.

## 2020-11-25 ENCOUNTER — Telehealth: Payer: Self-pay

## 2020-11-25 NOTE — Telephone Encounter (Signed)
I  left a message to Darral Dash CPhT at [facility] explaining without a consent form from the patient authorizing medical records to be released to them we are unable to honor this request. I provided the appropiate number to fax consent once it's obtained

## 2020-11-28 ENCOUNTER — Inpatient Hospital Stay (HOSPITAL_COMMUNITY)
Admission: AD | Admit: 2020-11-28 | Discharge: 2020-12-01 | DRG: 281 | Disposition: A | Discharge status: Home / Self Care | Payer: Medicare HMO | Source: Other Acute Inpatient Hospital | Attending: Cardiology | Admitting: Cardiology

## 2020-11-28 ENCOUNTER — Other Ambulatory Visit: Payer: Self-pay

## 2020-11-28 ENCOUNTER — Encounter (HOSPITAL_COMMUNITY): Payer: Self-pay | Admitting: Cardiology

## 2020-11-28 DIAGNOSIS — R7989 Other specified abnormal findings of blood chemistry: Secondary | ICD-10-CM | POA: Diagnosis not present

## 2020-11-28 DIAGNOSIS — R079 Chest pain, unspecified: Secondary | ICD-10-CM | POA: Diagnosis not present

## 2020-11-28 DIAGNOSIS — Z79899 Other long term (current) drug therapy: Secondary | ICD-10-CM | POA: Diagnosis not present

## 2020-11-28 DIAGNOSIS — R7303 Prediabetes: Secondary | ICD-10-CM | POA: Diagnosis present

## 2020-11-28 DIAGNOSIS — Z8249 Family history of ischemic heart disease and other diseases of the circulatory system: Secondary | ICD-10-CM

## 2020-11-28 DIAGNOSIS — I739 Peripheral vascular disease, unspecified: Secondary | ICD-10-CM | POA: Diagnosis present

## 2020-11-28 DIAGNOSIS — F17210 Nicotine dependence, cigarettes, uncomplicated: Secondary | ICD-10-CM | POA: Diagnosis present

## 2020-11-28 DIAGNOSIS — I214 Non-ST elevation (NSTEMI) myocardial infarction: Secondary | ICD-10-CM | POA: Diagnosis present

## 2020-11-28 DIAGNOSIS — N179 Acute kidney failure, unspecified: Secondary | ICD-10-CM | POA: Diagnosis present

## 2020-11-28 DIAGNOSIS — I25810 Atherosclerosis of coronary artery bypass graft(s) without angina pectoris: Secondary | ICD-10-CM | POA: Diagnosis not present

## 2020-11-28 DIAGNOSIS — E785 Hyperlipidemia, unspecified: Secondary | ICD-10-CM | POA: Diagnosis present

## 2020-11-28 DIAGNOSIS — Z7902 Long term (current) use of antithrombotics/antiplatelets: Secondary | ICD-10-CM

## 2020-11-28 DIAGNOSIS — I10 Essential (primary) hypertension: Secondary | ICD-10-CM

## 2020-11-28 DIAGNOSIS — I252 Old myocardial infarction: Secondary | ICD-10-CM

## 2020-11-28 DIAGNOSIS — I2510 Atherosclerotic heart disease of native coronary artery without angina pectoris: Secondary | ICD-10-CM | POA: Diagnosis present

## 2020-11-28 DIAGNOSIS — E7800 Pure hypercholesterolemia, unspecified: Secondary | ICD-10-CM | POA: Diagnosis not present

## 2020-11-28 DIAGNOSIS — Z7982 Long term (current) use of aspirin: Secondary | ICD-10-CM | POA: Diagnosis not present

## 2020-11-28 DIAGNOSIS — Z955 Presence of coronary angioplasty implant and graft: Secondary | ICD-10-CM | POA: Diagnosis not present

## 2020-11-28 DIAGNOSIS — I2583 Coronary atherosclerosis due to lipid rich plaque: Secondary | ICD-10-CM | POA: Diagnosis not present

## 2020-11-28 HISTORY — DX: Non-ST elevation (NSTEMI) myocardial infarction: I21.4

## 2020-11-28 LAB — HEPARIN LEVEL (UNFRACTIONATED): Heparin Unfractionated: 0.38 IU/mL (ref 0.30–0.70)

## 2020-11-28 MED ORDER — HEPARIN (PORCINE) 25000 UT/250ML-% IV SOLN
1100.0000 [IU]/h | INTRAVENOUS | Status: DC
  Administered 2020-11-29 – 2020-11-30 (×2): 1100 [IU]/h via INTRAVENOUS
  Filled 2020-11-28 (×2): qty 250

## 2020-11-28 MED ORDER — NITROGLYCERIN IN D5W 200-5 MCG/ML-% IV SOLN: 2.0000 ug/min | INTRAVENOUS | Status: DC

## 2020-11-28 MED ORDER — ASPIRIN 300 MG RE SUPP
300.0000 mg | RECTAL | Status: AC
  Filled 2020-11-28: qty 1

## 2020-11-28 MED ORDER — ACETAMINOPHEN 325 MG PO TABS
650.0000 mg | ORAL_TABLET | ORAL | Status: DC | PRN
  Administered 2020-11-29 (×2): 650 mg via ORAL
  Filled 2020-11-28 (×2): qty 2

## 2020-11-28 MED ORDER — MORPHINE SULFATE (PF) 2 MG/ML IV SOLN
2.0000 mg | INTRAVENOUS | Status: DC | PRN
Start: 2020-11-28 — End: 2020-12-01
  Administered 2020-11-29 – 2020-12-01 (×7): 2 mg via INTRAVENOUS
  Filled 2020-11-28 (×7): qty 1

## 2020-11-28 MED ORDER — ASPIRIN EC 81 MG PO TBEC
81.0000 mg | DELAYED_RELEASE_TABLET | Freq: Every day | ORAL | Status: DC
  Administered 2020-11-29 – 2020-11-30 (×2): 81 mg via ORAL
  Filled 2020-11-28 (×3): qty 1

## 2020-11-28 MED ORDER — NITROGLYCERIN 0.4 MG SL SUBL: 0.4000 mg | SUBLINGUAL_TABLET | SUBLINGUAL | Status: DC | PRN

## 2020-11-28 MED ORDER — CLOPIDOGREL BISULFATE 75 MG PO TABS
75.0000 mg | ORAL_TABLET | Freq: Every day | ORAL | Status: DC
  Administered 2020-11-29 – 2020-12-01 (×4): 75 mg via ORAL
  Filled 2020-11-28 (×4): qty 1

## 2020-11-28 MED ORDER — ZOLPIDEM TARTRATE 5 MG PO TABS: 5.0000 mg | ORAL_TABLET | Freq: Every evening | ORAL | Status: DC | PRN

## 2020-11-28 MED ORDER — ONDANSETRON HCL 4 MG/2ML IJ SOLN
4.0000 mg | Freq: Four times a day (QID) | INTRAMUSCULAR | Status: DC | PRN
  Administered 2020-11-29 – 2020-12-01 (×3): 4 mg via INTRAVENOUS
  Filled 2020-11-28 (×4): qty 2

## 2020-11-28 MED ORDER — ASPIRIN 81 MG PO CHEW: 324.0000 mg | CHEWABLE_TABLET | ORAL | Status: AC

## 2020-11-28 NOTE — Progress Notes (Signed)
ANTICOAGULATION CONSULT NOTE  Pharmacy Consult for IV Heparin Indication: chest pain/ACS  No Known Allergies  Patient Measurements: Height: 6\' 1"  (185.4 cm) Weight: 91.4 kg (201 lb 9.6 oz) IBW/kg (Calculated) : 79.9 Heparin Dosing Weight: 91.4 kg  Vital Signs: Temp: 98.6 F (37 C) (09/30 2005) Temp Source: Oral (09/30 2005) BP: 120/90 (09/30 2005) Pulse Rate: 61 (09/30 2005)  Labs: Recent Labs    11/28/20 2238  HEPARINUNFRC 0.38    CrCl cannot be calculated (Patient's most recent lab result is older than the maximum 21 days allowed.).   Medical History: Past Medical History:  Diagnosis Date   Abnormal stress test    Anxiety    Asthma    as a child   Atypical chest pain 05/07/2017   Coronary artery disease    DDD (degenerative disc disease), lumbar 11/08/2019   Formatting of this note might be different from the original. 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 Consult for pharmacy to dose IV Heparin.  Transferred from [facility] ED on IV Heparin at 1050 units/hr (10.5 ml/hr; concentration confirmed 100 units/ml). Heparin infusion was started with a 4000 unit Heparin bolus at ~1350 PM today. Hgb 5.5/Hct 43.3 and Platelets 208. SCr elevated at 1.40 (eCrCl ~ 77 ml/min).   Heparin level 0.38 (therapeutic) on gtt at 1050 units/hr. No bleeding noted.  Goal of Therapy:  Heparin level 0.3-0.7 units/ml Monitor platelets by anticoagulation protocol: Yes   Plan:  Continue IV Heparin at 1050 units/hr Daily Heparin level and CBC  [facility], PharmD, BCPS Please see amion for complete clinical  pharmacist phone list 11/28/2020,11:23 PM

## 2020-11-28 NOTE — H&P (Signed)
Cardiology Admission History and Physical:   Patient ID: Benjamin Espinoza MRN: [id number]; DOB: 11/15/1965   Admission date: 11/28/2020  PCP:  Teena Irani, PA-C   [facility] Providers Cardiologist:  Thomasene Ripple, DO        Chief Complaint:  CP  Patient Profile:   Benjamin Espinoza is a 55 y.o. male with a hx of hx of coronary artery disease status post CABG in 2017 in [place]. on April 24, 2019 the patient did have unstable angina was transferred to [facility] where he underwent a left heart catheterization showing severe native dz w/ patent grafts. he subsequently underwent a work-up with nuclear stress test which was abnormal he was sent for cardiac catheterization, done on November 26, 2019 showing 70% ostial lesion in SVG to PDA .  this was treated with a resolute Onyx stent. He additionally has h/o  hypertension, hyperlipidemia, prediabetes, tobacco use and degenerative disc disease. He has PAD and is s/p R femoral artery bypass surgery in 2022.  History of Present Illness:   Benjamin Espinoza presented to the ED at [facility] c/o CP described as a L-sided "pressure" sensation that has been intermittently bothering him for the past couple weeks. The sx are worse w/ exertion, and he has associated SOB/DOE. Pt had no acute ST elevation but did have TWI in the lateral/anterolateral leads more prominent than prior tracings, and troponin (standard) was found to be 0.18 in setting of mild AKI w/ Creatinine 1.4 (baseline Cr 0.9 on 06/08/20). Troponin was never repeated. He was transferred to [facility] for further evaluation on IV heparin. Pt adds the additional info this evening that he has been out of several medications, including statin and plavix, for the past month.   Past Medical History:  Diagnosis Date   Abnormal stress test    Anxiety    Asthma    as a child   Atypical chest pain 05/07/2017   Coronary artery disease    DDD (degenerative disc disease), lumbar 11/08/2019    Formatting of this note might be different from the original. 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 [place]   Hyperlipidemia    Myocardial infarction [facility]) 2017   PVD (peripheral vascular disease) (HCC) 09/21/2019   Shortness of breath    Tobacco use disorder     Past Surgical History:  Procedure Laterality Date   ABDOMINAL AORTOGRAM W/LOWER EXTREMITY Bilateral 09/21/2019   Procedure: ABDOMINAL AORTOGRAM W/LOWER EXTREMITY;  Surgeon: [name], MD;  Location: [facility] INVASIVE CV LAB;  Service: Cardiovascular;  Laterality: Bilateral;   ABDOMINAL AORTOGRAM W/LOWER EXTREMITY N/A 12/07/2019   Procedure: ABDOMINAL AORTOGRAM W/LOWER EXTREMITY;  Surgeon: [name], MD;  Location: [facility] INVASIVE CV LAB;  Service: Cardiovascular;  Laterality: N/A;   CARDIAC CATHETERIZATION     CORONARY ARTERY BYPASS GRAFT     quadruple bypas 2017   CORONARY STENT INTERVENTION N/A 11/26/2019   Procedure: CORONARY STENT INTERVENTION;  Surgeon: Yvonne Kendall, MD;  Location: [facility] INVASIVE CV LAB;  Service: Cardiovascular;  Laterality: N/A;   FEMORAL-POPLITEAL BYPASS GRAFT Right 12/25/2019   Procedure: BYPASS GRAFT FEMORAL- BELOW KNEE POPLITEAL ARTERY RIGHT USING GORE PROPATEN VASCULAR GRAFT;  Surgeon: [name], MD;  Location: [facility] OR;  Service: Vascular;  Laterality: Right;   LEFT HEART CATH AND CORS/GRAFTS ANGIOGRAPHY N/A 11/26/2019   Procedure: LEFT HEART CATH AND CORS/GRAFTS ANGIOGRAPHY;  Surgeon: Yvonne Kendall, MD;  Location: [facility] INVASIVE CV LAB;  Service: Cardiovascular;  Laterality: N/A;   LOWER EXTREMITY ANGIOGRAM Right 12/25/2019   Procedure: RIGHT LOWER EXTREMITY ANGIOGRAM;  Surgeon: [name], MD;  Location: [facility] OR;  Service: Vascular;  Laterality: Right;   PERIPHERAL VASCULAR BALLOON ANGIOPLASTY Left 12/07/2019   Procedure: PERIPHERAL VASCULAR BALLOON ANGIOPLASTY;  Surgeon: [name], MD;  Location: [facility] INVASIVE CV LAB;   Service: Cardiovascular;  Laterality: Left;  SFA     Medications Prior to Admission: Prior to Admission medications   Medication Sig Start Date End Date Taking? Authorizing Provider  aspirin EC 81 MG tablet Take 2 tablets (162 mg total) by mouth daily. 05/08/17   Yates Decamp, MD  cilostazol (PLETAL) 100 MG tablet Take 100 mg by mouth daily.  08/02/19   [provider]  clopidogrel (PLAVIX) 75 MG tablet Take 1 tablet (75 mg total) by mouth daily. 11/26/19   Kroeger, Ovidio Kin., PA-C  gabapentin (NEURONTIN) 600 MG tablet Take 600 mg by mouth 2 (two) times daily as needed (Pain).     [provider]  isosorbide mononitrate (IMDUR) 30 MG 24 hr tablet Take 1 tablet (30 mg total) by mouth daily. 05/28/20 08/26/20  Tobb, Kardie, DO  lisinopril (ZESTRIL) 10 MG tablet Take 10 mg by mouth daily. 02/06/20   [provider]  losartan (COZAAR) 25 MG tablet Take 1 tablet (25 mg total) by mouth daily. 11/29/19   Tobb, Kardie, DO  metoprolol succinate (TOPROL XL) 25 MG 24 hr tablet Take 1 tablet (25 mg total) by mouth daily. 05/28/20   Tobb, Kardie, DO  naproxen (NAPROSYN) 500 MG tablet Take 500 mg by mouth 2 (two) times daily with a meal. 02/06/20   [provider]  Oxycodone HCl 10 MG TABS Take 10 mg by mouth in the morning, at noon, in the evening, and at bedtime.    [provider]  oxyCODONE-acetaminophen (PERCOCET/ROXICET) 5-325 MG tablet Take 1 tablet by mouth every 4 (four) hours as needed for severe pain. 01/09/20   [name], MD  pantoprazole (PROTONIX) 40 MG tablet Take 1 tablet (40 mg total) by mouth daily. 05/28/20   Tobb, Kardie, DO  ranolazine (RANEXA) 1000 MG SR tablet Take 1 tablet (1,000 mg total) by mouth 2 (two) times daily. 05/28/20   Tobb, Kardie, DO  REPATHA SURECLICK 140 MG/ML SOAJ Inject the contents of 1 pen (140 mg) under the skin every fourteen (14) days. 08/18/20   Tobb, Lavona Mound, DO     Allergies:   No Known Allergies  Social History:    Social History   Socioeconomic History   Marital status: Married    Spouse name: Not on file   Number of children: Not on file   Years of education: Not on file   Highest education level: Not on file  Occupational History   Not on file  Tobacco Use   Smoking status: Every Day    Years: 25.00    Types: Cigars, Cigarettes   Smokeless tobacco: Never   Tobacco comments:    as of 12/20/19 2 cigararettes/day  Vaping Use   Vaping Use: Never used  Substance and Sexual Activity   Alcohol use: No   Drug use: No   Sexual activity: Not on file  Other Topics Concern   Not on file  Social History Narrative   Not on file   Social Determinants of Health   Financial Resource Strain: Not on file  Food Insecurity: Not on file  Transportation Needs: Not on file  Physical Activity: Not on file  Stress: Not on file  Social Connections: Not on file  Intimate Partner Violence: Not on file    Family History:   The patient's family history includes Heart disease in his father, mother, and sister.    ROS:  Please see the history of present illness.  All other ROS reviewed and negative.     Physical Exam/Data:   Vitals:   11/28/20 2005  BP: 120/90  Pulse: 61  Temp: 98.6 F (37 C)  TempSrc: Oral  SpO2: 94%  Weight: 91.4 kg  Height: 6\' 1"  (1.854 m)   No intake or output data in the 24 hours ending 11/28/20 2138 Last 3 Weights 11/28/2020 05/28/2020 02/21/2020  Weight (lbs) 201 lb 9.6 oz 199 lb 12.8 oz 200 lb  Weight (kg) 91.445 kg 90.629 kg 90.719 kg     Body mass index is 26.6 kg/m.  General:  Well nourished, well developed, in no acute distress HEENT: normal Neck: no JVD Vascular: No carotid bruits; Distal pulses 2+ bilaterally   Cardiac:  normal S1, S2; RRR; no murmur  Lungs:  clear to auscultation bilaterally, no wheezing, rhonchi or rales  Abd: soft, nontender, no hepatomegaly  Ext: no edema Musculoskeletal:  No deformities Skin: warm and dry  Neuro:  no focal  abnormalities noted Psych:  Normal affect    EKG:  The ECG that was done 11-28-20 was personally reviewed and demonstrates NSR with TWI in the anterolateral/lateral leads (more prominent as compared to prior)  Relevant CV Studies: LHC 04-06-19 Coronary artery disease including severe native 3-vessel coronary artery disease (60% ostial LMCA stenosis, complete occlusion of the LAD at the ostium, severe diffuse disease in OM1 with up to 80% disease proximally, complete occlusion of RCA proximally).  Bypass grafts (LIMA-LAD, VG to OM1, VG to OM2 and VG to PDA) all widely patent.  LHC 11-26-19 Conclusions: Severe native coronary artery disease including 50% ostial LMCA stenosis, chronic total occlusions of the ostial, mid, and distal LAD, 80% stenosis of mid LCx as well as chronic total occlusion of OM1, and chronic total occlusion of the proximal through distal RCA.  There is also a 90% stenosis in the RPDA distal to the SVG anastomosis. Widely patent LIMA to LAD, supplying a relatively small territory as the distal LAD is chronically occluded. Patent SVG to OM1 with 30% ostial/proximal stenosis. Widely patent SVG to OM 2. Patent SVG to RPDA with 70% ostial/proximal disease as well as 90% stenosis at the distal anastomosis extending into the RPDA. Normal left ventricular filling pressure with mildly to moderately reduced systolic function (LVEF ~45%). Successful PCI to distal anastomosis of SVG to RPDA extending into the RPDA with a Resolute Onyx 2.25 x 18 mm drug-eluting stent (postdilated to 2.9 mm proximally) with 0% residual stenosis and TIMI-3 flow. Successful PCI to ostial/proximal SVG to RPDA using Resolute Onyx 2.75 x 18 mm drug-eluting stent with 0% residual stenosis and TIMI-3 flow.    Recommendations: Continue dual antiplatelet therapy with aspirin and clopidogrel for at least 6 months, ideally longer. Continue aggressive secondary prevention as well as antianginal therapy with  ranolazine. Anticipate same-day discharge this afternoon if no post catheterization complications.  05-08-17 TTE Nl LV sz w/ mild conc LVH, EF 50-55%, normal diastolic function, tr MR, nl RV, tr TR, tr PI  Laboratory Data:  High Sensitivity Troponin:  No results for input(s): TROPONINIHS in the last 720 hours.    ChemistryNo results for input(s): NA, K, CL, CO2,  GLUCOSE, BUN, CREATININE, CALCIUM, MG, GFRNONAA, GFRAA, ANIONGAP in the last 168 hours.  No results for input(s): PROT, ALBUMIN, AST, ALT, ALKPHOS, BILITOT in the last 168 hours. Lipids No results for input(s): CHOL, TRIG, HDL, LABVLDL, LDLCALC, CHOLHDL in the last 168 hours. HematologyNo results for input(s): WBC, RBC, HGB, HCT, MCV, MCH, MCHC, RDW, PLT in the last 168 hours. Thyroid No results for input(s): TSH, FREET4 in the last 168 hours. BNPNo results for input(s): BNP, PROBNP in the last 168 hours.  DDimer No results for input(s): DDIMER in the last 168 hours.   Radiology/Studies:  No results found.   Assessment and Plan:   CP/mild trop elevation: given pt's extensive h/o native CAD/CABG and subsequent PCI, will plan for LHC electively. Cont IV heparin and NTG overnight. TTE. FLP, TSH, A1c for risk stratification. Cycle HS trop overnight CAD: cont home regimen for CAD/PAD HTN/dyslipidemia: will cont home regimen and adjust PRN   Risk Assessment/Risk Scores:    TIMI Risk Score for Unstable Angina or Non-ST Elevation MI:   The patient's TIMI risk score is 5, which indicates a 26% risk of all cause mortality, new or recurrent myocardial infarction or need for urgent revascularization in the next 14 days.       Severity of Illness: The appropriate patient status for this patient is INPATIENT. Inpatient status is judged to be reasonable and necessary in order to provide the required intensity of service to ensure the patient's safety. The patient's presenting symptoms, physical exam findings, and initial radiographic  and laboratory data in the context of their chronic comorbidities is felt to place them at high risk for further clinical deterioration. Furthermore, it is not anticipated that the patient will be medically stable for discharge from the hospital within 2 midnights of admission. The following factors support the patient status of inpatient.   " The patient's presenting symptoms include CP, angina. " The worrisome physical exam findings include NA. " The initial radiographic and laboratory data are worrisome because of mild EKG changes, abnormal troponin. " The chronic co-morbidities include PAD.   * I certify that at the point of admission it is my clinical judgment that the patient will require inpatient hospital care spanning beyond 2 midnights from the point of admission due to high intensity of service, high risk for further deterioration and high frequency of surveillance required.*   For questions or updates, please contact [facility] Please consult [email or web address] for contact info under     Signed, Precious Reel, MD, [facility] 11/28/2020 9:38 PM

## 2020-11-28 NOTE — Progress Notes (Signed)
ANTICOAGULATION CONSULT NOTE - Initial Consult  Pharmacy Consult for IV Heparin Indication: chest pain/ACS  No Known Allergies  Patient Measurements: Height: 6\' 1"  (185.4 cm) Weight: 91.4 kg (201 lb 9.6 oz) IBW/kg (Calculated) : 79.9 Heparin Dosing Weight: 91.4 kg  Vital Signs: Temp: 98.6 F (37 C) (09/30 2005) Temp Source: Oral (09/30 2005) BP: 120/90 (09/30 2005) Pulse Rate: 61 (09/30 2005)  Labs: No results for input(s): HGB, HCT, PLT, APTT, LABPROT, INR, HEPARINUNFRC, HEPRLOWMOCWT, CREATININE, CKTOTAL, CKMB, TROPONINIHS in the last 72 hours.  CrCl cannot be calculated (Patient's most recent lab result is older than the maximum 21 days allowed.).   Medical History: Past Medical History:  Diagnosis Date   Abnormal stress test    Anxiety    Asthma    as a child   Atypical chest pain 05/07/2017   Coronary artery disease    DDD (degenerative disc disease), lumbar 11/08/2019   Formatting of this note might be different from the original. 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 01/16/2016   Hyperlipidemia    Myocardial infarction [facility]) 2017   PVD (peripheral vascular disease) (HCC) 09/21/2019   Shortness of breath    Tobacco use disorder     Medications:  IV Heparin infusion at 1050 units/hr (10.5 ml/hr) on transfer  Assessment: 55 years of age male with history of CAD status post CABG 2017 and Onyx stent placement September 2021 who presented to ED at [facility] with chest pain on IV Heparin. Consult for pharmacy to dose IV Heparin.  Transferred from [facility] ED on IV Heparin at 1050 units/hr (10.5 ml/hr; concentration confirmed 100 units/ml). Heparin infusion was started with a 4000 unit Heparin bolus at ~1350 PM today. Hgb 5.5/Hct 43.3 and Platelets 208. SCr elevated at 1.40 (eCrCl ~ 77 ml/min).   Goal of Therapy:  Heparin level 0.3-0.7 units/ml Monitor platelets by anticoagulation protocol: Yes   Plan:  Continue IV Heparin at 1050  units/hr Heparin level now as ~8 hr since initiation and do not see level in [place] reports.  Daily Heparin level and CBC  [place], PharmD, BCPS, BCCCP Clinical Pharmacist Please refer to [facility] for [facility] numbers 11/28/2020,9:52 PM

## 2020-11-29 ENCOUNTER — Inpatient Hospital Stay (HOSPITAL_COMMUNITY): Payer: Medicare HMO

## 2020-11-29 DIAGNOSIS — I214 Non-ST elevation (NSTEMI) myocardial infarction: Secondary | ICD-10-CM

## 2020-11-29 DIAGNOSIS — I2583 Coronary atherosclerosis due to lipid rich plaque: Secondary | ICD-10-CM

## 2020-11-29 DIAGNOSIS — E7800 Pure hypercholesterolemia, unspecified: Secondary | ICD-10-CM

## 2020-11-29 LAB — TROPONIN I (HIGH SENSITIVITY)
Troponin I (High Sensitivity): 158 ng/L (ref <18)
Troponin I (High Sensitivity): 157 ng/L (ref <18)
Troponin I (High Sensitivity): 116 ng/L (ref <18)
Troponin I (High Sensitivity): 94 ng/L — ABNORMAL HIGH (ref <18)

## 2020-11-29 LAB — BASIC METABOLIC PANEL
Anion gap: 5 (ref 5–15)
BUN: 14 mg/dL (ref 6–20)
CO2: 23 mmol/L (ref 22–32)
Calcium: 9 mg/dL (ref 8.9–10.3)
Chloride: 109 mmol/L (ref 98–111)
Creatinine, Ser: 1.3 mg/dL — ABNORMAL HIGH (ref 0.61–1.24)
GFR, Estimated: >60 mL/min (ref >60)
Glucose, Bld: 81 mg/dL (ref 70–99)
Potassium: 3.7 mmol/L (ref 3.5–5.1)
Sodium: 137 mmol/L (ref 135–145)

## 2020-11-29 LAB — CBC
HCT: 39.4 % (ref 39.0–52.0)
Hemoglobin: 12.7 g/dL — ABNORMAL LOW (ref 13.0–17.0)
MCH: 31.2 pg (ref 26.0–34.0)
MCHC: 32.2 g/dL (ref 30.0–36.0)
MCV: 96.8 fL (ref 80.0–100.0)
Platelets: 200 10*3/uL (ref 150–400)
RBC: 4.07 MIL/uL — ABNORMAL LOW (ref 4.22–5.81)
RDW: 14.3 % (ref 11.5–15.5)
WBC: 6.9 10*3/uL (ref 4.0–10.5)
nRBC: 0 % (ref 0.0–0.2)

## 2020-11-29 LAB — ECHOCARDIOGRAM COMPLETE
AR max vel: 3.66 cm2
AV Area VTI: 3.28 cm2
AV Area mean vel: 3.46 cm2
AV Mean grad: 2 mmHg
AV Peak grad: 4.6 mmHg
Ao pk vel: 1.07 m/s
Area-P 1/2: 4.06 cm2
Height: 73 in
S' Lateral: 4.1 cm
Weight: 3225.6 oz

## 2020-11-29 LAB — HEMOGLOBIN A1C
Hgb A1c MFr Bld: 5.3 % (ref 4.8–5.6)
Mean Plasma Glucose: 105.41 mg/dL

## 2020-11-29 LAB — LIPID PANEL
Cholesterol: 210 mg/dL — ABNORMAL HIGH (ref 0–200)
HDL: 40 mg/dL — ABNORMAL LOW (ref >40)
LDL Cholesterol: 142 mg/dL — ABNORMAL HIGH (ref 0–99)
Total CHOL/HDL Ratio: 5.3 RATIO
Triglycerides: 141 mg/dL (ref <150)
VLDL: 28 mg/dL (ref 0–40)

## 2020-11-29 LAB — HEPARIN LEVEL (UNFRACTIONATED): Heparin Unfractionated: 0.31 IU/mL (ref 0.30–0.70)

## 2020-11-29 LAB — TSH: TSH: 1.909 u[IU]/mL (ref 0.350–4.500)

## 2020-11-29 LAB — HIV ANTIBODY (ROUTINE TESTING W REFLEX): HIV Screen 4th Generation wRfx: NONREACTIVE

## 2020-11-29 MED ORDER — RANOLAZINE ER 500 MG PO TB12
1000.0000 mg | ORAL_TABLET | Freq: Two times a day (BID) | ORAL | Status: DC
  Administered 2020-11-29 – 2020-12-01 (×5): 1000 mg via ORAL
  Filled 2020-11-29 (×5): qty 2

## 2020-11-29 MED ORDER — NICOTINE 21 MG/24HR TD PT24
21.0000 mg | MEDICATED_PATCH | Freq: Every day | TRANSDERMAL | Status: DC
  Administered 2020-11-29 – 2020-11-30 (×2): 21 mg via TRANSDERMAL
  Filled 2020-11-29 (×3): qty 1

## 2020-11-29 MED ORDER — SODIUM CHLORIDE 0.9% FLUSH
3.0000 mL | Freq: Two times a day (BID) | INTRAVENOUS | Status: DC
  Administered 2020-11-29 – 2020-11-30 (×3): 3 mL via INTRAVENOUS

## 2020-11-29 MED ORDER — HYDROCORTISONE 1 % EX CREA
TOPICAL_CREAM | CUTANEOUS | Status: DC | PRN
  Filled 2020-11-29: qty 28

## 2020-11-29 MED ORDER — ATORVASTATIN CALCIUM 80 MG PO TABS
80.0000 mg | ORAL_TABLET | Freq: Every day | ORAL | Status: DC
  Administered 2020-11-29 – 2020-12-01 (×3): 80 mg via ORAL
  Filled 2020-11-29 (×3): qty 1

## 2020-11-29 MED ORDER — METOPROLOL SUCCINATE ER 25 MG PO TB24
25.0000 mg | ORAL_TABLET | Freq: Every day | ORAL | Status: DC
  Administered 2020-11-29 – 2020-12-01 (×3): 25 mg via ORAL
  Filled 2020-11-29 (×3): qty 1

## 2020-11-29 MED ORDER — ATORVASTATIN CALCIUM 10 MG PO TABS: 20.0000 mg | ORAL_TABLET | Freq: Every day | ORAL | Status: DC

## 2020-11-29 MED ORDER — AMLODIPINE BESYLATE 5 MG PO TABS
5.0000 mg | ORAL_TABLET | Freq: Every day | ORAL | Status: DC
  Administered 2020-11-29 – 2020-12-01 (×3): 5 mg via ORAL
  Filled 2020-11-29 (×3): qty 1

## 2020-11-29 MED ORDER — ALUM & MAG HYDROXIDE-SIMETH 200-200-20 MG/5ML PO SUSP
30.0000 mL | Freq: Four times a day (QID) | ORAL | Status: DC | PRN
  Administered 2020-11-29: 30 mL via ORAL
  Filled 2020-11-29: qty 30

## 2020-11-29 NOTE — Progress Notes (Signed)
  Echocardiogram 2D Echocardiogram has been performed.  Benjamin Espinoza F 11/29/2020, 12:37 PM

## 2020-11-29 NOTE — Plan of Care (Signed)

## 2020-11-29 NOTE — Progress Notes (Addendum)
Progress Note  Patient Name: Benjamin Espinoza Date of Encounter: 11/29/2020  [facility] Cardiologist: Thomasene Ripple, DO   Subjective   Mild 2/10 chest pressure this am.   hsTrop elevated at 158>157.  Main complaint is severe HA  Inpatient Medications    Scheduled Meds:  aspirin  324 mg Oral NOW   Or   aspirin  300 mg Rectal NOW   aspirin EC  81 mg Oral Daily   clopidogrel  75 mg Oral Q breakfast   Continuous Infusions:  heparin     nitroGLYCERIN 20 mcg/min (11/29/20 0655)   PRN Meds: acetaminophen, hydrocortisone cream, morphine injection, nitroGLYCERIN, ondansetron (ZOFRAN) IV, zolpidem   Vital Signs    Vitals:   11/28/20 2306 11/28/20 2335 11/29/20 0005 11/29/20 0407  BP: (!) 126/92 118/71 109/74 124/85  Pulse: 86 75 74 77  Resp:   18 18  Temp:   98.5 F (36.9 C) 98.7 F (37.1 C)  TempSrc:   Oral Oral  SpO2: 94% 91% 95% 96%  Weight:      Height:        Intake/Output Summary (Last 24 hours) at 11/29/2020 0808 Last data filed at 11/29/2020 0646 Gross per 24 hour  Intake 324 ml  Output 875 ml  Net -551 ml   Last 3 Weights 11/28/2020 05/28/2020 02/21/2020  Weight (lbs) 201 lb 9.6 oz 199 lb 12.8 oz 200 lb  Weight (kg) 91.445 kg 90.629 kg 90.719 kg      Telemetry    NSR- Personally Reviewed  ECG    Sinus bradycardia with LVH and repol - Personally Reviewed  Physical Exam   GEN: No acute distress.   Neck: No JVD Cardiac: RRR, no murmurs, rubs, or gallops.  Respiratory: Clear to auscultation bilaterally. GI: Soft, nontender, non-distended  MS: No edema; No deformity. Neuro:  Nonfocal  Psych: Normal affect   Labs    High Sensitivity Troponin:   Recent Labs  Lab 11/29/20 0200 11/29/20 0523  TROPONINIHS 158* 157*      Chemistry Recent Labs  Lab 11/29/20 0202  NA 137  K 3.7  CL 109  CO2 23  GLUCOSE 81  BUN 14  CREATININE 1.30*  CALCIUM 9.0  GFRNONAA >60  ANIONGAP 5     Hematology Recent Labs  Lab 11/29/20 0202  WBC 6.9   RBC 4.07*  HGB 12.7*  HCT 39.4  MCV 96.8  MCH 31.2  MCHC 32.2  RDW 14.3  PLT 200    BNPNo results for input(s): BNP, PROBNP in the last 168 hours.   DDimer No results for input(s): DDIMER in the last 168 hours.   Radiology    No results found.  Cardiac Studies   Cardiac Cath 11/25/2020 LHC 11-26-19 Conclusions: Severe native coronary artery disease including 50% ostial LMCA stenosis, chronic total occlusions of the ostial, mid, and distal LAD, 80% stenosis of mid LCx as well as chronic total occlusion of OM1, and chronic total occlusion of the proximal through distal RCA.  There is also a 90% stenosis in the RPDA distal to the SVG anastomosis. Widely patent LIMA to LAD, supplying a relatively small territory as the distal LAD is chronically occluded. Patent SVG to OM1 with 30% ostial/proximal stenosis. Widely patent SVG to OM 2. Patent SVG to RPDA with 70% ostial/proximal disease as well as 90% stenosis at the distal anastomosis extending into the RPDA. Normal left ventricular filling pressure with mildly to moderately reduced systolic function (LVEF ~45%). Successful PCI to  distal anastomosis of SVG to RPDA extending into the RPDA with a Resolute Onyx 2.25 x 18 mm drug-eluting stent (postdilated to 2.9 mm proximally) with 0% residual stenosis and TIMI-3 flow. Successful PCI to ostial/proximal SVG to RPDA using Resolute Onyx 2.75 x 18 mm drug-eluting stent with 0% residual stenosis and TIMI-3 flow.     Recommendations: Continue dual antiplatelet therapy with aspirin and clopidogrel for at least 6 months, ideally longer. Continue aggressive secondary prevention as well as antianginal therapy with ranolazine. Anticipate same-day discharge this afternoon if no post catheterization complications.    Patient Profile     55 y.o. male with a hx of hx of coronary artery disease status post CABG in 2017 in [place]. on April 24, 2019 the patient did have unstable angina was  transferred to [facility] where he underwent a left heart catheterization showing severe native dz w/ patent grafts. he subsequently underwent a work-up with nuclear stress test which was abnormal he was sent for cardiac catheterization, done on November 26, 2019 showing 70% ostial lesion in SVG to PDA .  this was treated with a resolute Onyx stent. He additionally has h/o  hypertension, hyperlipidemia, prediabetes, tobacco use and degenerative disc disease. He has PAD and is s/p R femoral artery bypass surgery in 2022.  Now admitted with chest pain and elevated hsTrop after running out of his meds including Plavix and statin for the past month.  Assessment & Plan    ASCAD/NSTEMI -s/p remote CABG in 2017 in [place] -cath 11/26/19 with 70% ostial SVG to PDA s/p resolute Onyx PCI -recently has been having SOB felt to be his anginal equivalent and was started on Ranexa and long acting nitrates -Plan by Dr. Servando Salina was cath if he continued to be symptomatic despite uptitration of anti anginal meds -recently ran out of statin and Plavix a month ago -still having Chest pressure and also severe HA from NTG -hsTrop mildly elevated with flat trend -cannot increase BB further due to bradycardia -continue Ranexa 1000mg  BID, IV Heparing gtt  -stop IV NTG gtt due to severe HA (also says he is intolerant to Imdur due to HA) -continue ASA, Plavix 75mg  daily and statin/Repatha -plan LHC on Monday -Shared Decision Making/Informed Consent The risks [stroke (1 in 1000), death (1 in 1000), kidney failure [usually temporary] (1 in 500), bleeding (1 in 200), allergic reaction [possibly serious] (1 in 200)], benefits (diagnostic support and management of coronary artery disease) and alternatives of a cardiac catheterization were discussed in detail with Mr. Phenix and he is willing to proceed.   2.  HTN -BP elevated this am but controlled for the most part yesterday with some mild elevations in DBP -will not titrate BB  due to borderline bradycardia>continue Toprol XL 25mg  daily -SCr bumped this am to 1.3 so will hold ARB -start Amlodipine 5mg  daily which will also help with angina -BMET in am  3.  HLD -LDL goal < 70 -he ran out of his statin a month ago -LDL 142 and HDL 40 this admit and says that he did not get his Repatha in the mail last month but just got it and took it this week -Increase Lipitor to 80mg  daily (he says that he has not had any problems with statins in the past) -restarted Repatha at home recently -repeat FLp and ALT in 8 weeks  4.  AKI -SCr 1.04 a year ago and bumped to 1.3 today -hold ARB -repeat BMET in am  I have spent  a total of 35 minutes with patient reviewing cardiac cath, office notes , telemetry, EKGs, labs and examining patient as well as establishing an assessment and plan that was discussed with the patient.  > 50% of time was spent in direct patient care.     For questions or updates, please contact [facility] Please consult [email or web address] for contact info under        Signed, Armanda Magic, MD  11/29/2020, 8:08 AM

## 2020-11-29 NOTE — Plan of Care (Signed)
  Problem: Clinical Measurements: Goal: Ability to maintain clinical measurements within normal limits will improve Outcome: Progressing Goal: Will remain free from infection Outcome: Progressing Goal: Diagnostic test results will improve Outcome: Progressing Goal: Cardiovascular complication will be avoided Outcome: Progressing   Problem: Activity: Goal: Risk for activity intolerance will decrease Outcome: Progressing   

## 2020-11-29 NOTE — Progress Notes (Signed)
ANTICOAGULATION CONSULT NOTE  Pharmacy Consult for IV Heparin Indication: chest pain/ACS  No Known Allergies  Patient Measurements: Height: 6\' 1"  (185.4 cm) Weight: 91.4 kg (201 lb 9.6 oz) IBW/kg (Calculated) : 79.9 Heparin Dosing Weight: 91.4 kg  Vital Signs: Temp: 98.8 F (37.1 C) (10/01 0811) Temp Source: Oral (10/01 0811) BP: 140/101 (10/01 0811) Pulse Rate: 70 (10/01 0811)  Labs: Recent Labs    11/28/20 2238 11/29/20 0200 11/29/20 0201 11/29/20 0202 11/29/20 0523  HGB  --   --   --  12.7*  --   HCT  --   --   --  39.4  --   PLT  --   --   --  200  --   HEPARINUNFRC 0.38  --  0.31  --   --   CREATININE  --   --   --  1.30*  --   TROPONINIHS  --  158*  --   --  157*     Estimated Creatinine Clearance: 72.6 mL/min (A) (by C-G formula based on SCr of 1.3 mg/dL (H)).   Medical History: Past Medical History:  Diagnosis Date   Abnormal stress test    Anxiety    Asthma    as a child   Atypical chest pain 05/07/2017   Coronary artery disease    DDD (degenerative disc disease), lumbar 11/08/2019   Formatting of this note might be different from the original. 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 Plan is for elective LHC. Consult for pharmacy to dose IV Heparin.  Heparin level 0.31 and on lower end of therapeutic today on 1050 units/h. Will proactively increase to keep therapeutic since levels appear to be downtrending. Hgb and PLT are WNL. No IV site issues or signs/symptoms of bleeding.   Goal of Therapy:  Heparin level 0.3-0.7 units/ml Monitor platelets by anticoagulation protocol: Yes    Plan:  Inc IV heparin gtt to 1100 units/h Daily Heparin level and CBC Monitor for s/sx of bleeding Follow-up plan for LHC  [facility], PharmD PGY1 Ambulatory Care Pharmacy Resident 11/29/2020 8:18 AM

## 2020-11-30 LAB — BASIC METABOLIC PANEL
Anion gap: 8 (ref 5–15)
BUN: 7 mg/dL (ref 6–20)
CO2: 25 mmol/L (ref 22–32)
Calcium: 9.4 mg/dL (ref 8.9–10.3)
Chloride: 105 mmol/L (ref 98–111)
Creatinine, Ser: 1.21 mg/dL (ref 0.61–1.24)
GFR, Estimated: >60 mL/min (ref >60)
Glucose, Bld: 91 mg/dL (ref 70–99)
Potassium: 3.9 mmol/L (ref 3.5–5.1)
Sodium: 138 mmol/L (ref 135–145)

## 2020-11-30 LAB — CBC
HCT: 41.5 % (ref 39.0–52.0)
Hemoglobin: 14 g/dL (ref 13.0–17.0)
MCH: 31.5 pg (ref 26.0–34.0)
MCHC: 33.7 g/dL (ref 30.0–36.0)
MCV: 93.5 fL (ref 80.0–100.0)
Platelets: 206 10*3/uL (ref 150–400)
RBC: 4.44 MIL/uL (ref 4.22–5.81)
RDW: 13.9 % (ref 11.5–15.5)
WBC: 5.8 10*3/uL (ref 4.0–10.5)
nRBC: 0 % (ref 0.0–0.2)

## 2020-11-30 LAB — HEPARIN LEVEL (UNFRACTIONATED): Heparin Unfractionated: 0.31 IU/mL (ref 0.30–0.70)

## 2020-11-30 MED ORDER — SODIUM CHLORIDE 0.9 % WEIGHT BASED INFUSION
3.0000 mL/kg/h | INTRAVENOUS | Status: DC
  Administered 2020-12-01: 3 mL/kg/h via INTRAVENOUS

## 2020-11-30 MED ORDER — SODIUM CHLORIDE 0.9 % WEIGHT BASED INFUSION: 1.0000 mL/kg/h | INTRAVENOUS | Status: DC

## 2020-11-30 MED ORDER — ASPIRIN 81 MG PO CHEW
81.0000 mg | CHEWABLE_TABLET | ORAL | Status: AC
  Administered 2020-12-01: 81 mg via ORAL
  Filled 2020-11-30: qty 1

## 2020-11-30 MED ORDER — SODIUM CHLORIDE 0.9 % IV SOLN
250.0000 mL | INTRAVENOUS | Status: DC | PRN
Start: 2020-11-30 — End: 2020-12-01

## 2020-11-30 MED ORDER — SODIUM CHLORIDE 0.9% FLUSH: 3.0000 mL | INTRAVENOUS | Status: DC | PRN

## 2020-11-30 NOTE — Plan of Care (Signed)

## 2020-11-30 NOTE — Progress Notes (Signed)
ANTICOAGULATION CONSULT NOTE  Pharmacy Consult for IV Heparin Indication: chest pain/ACS  Allergies  Allergen Reactions   Imdur [Isosorbide Nitrate] Other (See Comments)    Severe headache    Patient Measurements: Height: 6\' 1"  (185.4 cm) Weight: 90.9 kg (200 lb 6.4 oz) IBW/kg (Calculated) : 79.9 Heparin Dosing Weight: 91.4 kg  Vital Signs: Temp: 98.6 F (37 C) (10/02 0902) Temp Source: Oral (10/02 0902) BP: 123/82 (10/02 0902) Pulse Rate: 63 (10/02 0902)  Labs: Recent Labs    11/28/20 2238 11/29/20 0200 11/29/20 0201 11/29/20 0202 11/29/20 0523 11/29/20 0948 11/29/20 1121 11/30/20 0233 11/30/20 0238  HGB  --   --   --  12.7*  --   --   --  14.0  --   HCT  --   --   --  39.4  --   --   --  41.5  --   PLT  --   --   --  200  --   --   --  206  --   HEPARINUNFRC 0.38  --  0.31  --   --   --   --  0.31  --   CREATININE  --   --   --  1.30*  --   --   --   --  1.21  TROPONINIHS  --    < >  --   --  157* 116* 94*  --   --    < > = values in this interval not displayed.     Estimated Creatinine Clearance: 78 mL/min (by C-G formula based on SCr of 1.21 mg/dL).   Medical History: Past Medical History:  Diagnosis Date   Abnormal stress test    Anxiety    Asthma    as a child   Atypical chest pain 05/07/2017   Coronary artery disease    DDD (degenerative disc disease), lumbar 11/08/2019   Formatting of this note might be different from the original. 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 Plan is for elective LHC on 10/3. Consult for pharmacy to dose IV Heparin.  Heparin level 0.31 and  therapeutic on 1100 units/h. Hgb and PLT are stable and within normal limits. No signs of bleeding or IV site issues per RN.   Goal of Therapy:  Heparin level 0.3-0.7 units/ml Monitor platelets by anticoagulation protocol: Yes   Plan:  Continue IV heparin gtt to 1100 units/h Daily Heparin level and CBC Monitor for s/sx of bleeding Follow-up plan for [facility]  [facility], PharmD PGY1 Ambulatory Care Pharmacy Resident 11/30/2020 10:23 AM

## 2020-11-30 NOTE — Progress Notes (Addendum)
Progress Note  Patient Name: Benjamin Espinoza Date of Encounter: 11/30/2020  [facility] Cardiologist: Thomasene Ripple, DO   Subjective   Stopped NTG yesterday due to HA and HA is resolved.  hsTrop elevated at 158>157>116>94.  O2 fell off last night and he had 6/10 chest burning which is now resolved after Morphine  Inpatient Medications    Scheduled Meds:  amLODipine  5 mg Oral Daily   aspirin EC  81 mg Oral Daily   atorvastatin  80 mg Oral Daily   clopidogrel  75 mg Oral Q breakfast   metoprolol succinate  25 mg Oral Daily   nicotine  21 mg Transdermal Daily   ranolazine  1,000 mg Oral BID   sodium chloride flush  3 mL Intravenous Q12H   Continuous Infusions:  heparin 1,100 Units/hr (11/29/20 1433)   PRN Meds: acetaminophen, alum & mag hydroxide-simeth, hydrocortisone cream, morphine injection, ondansetron (ZOFRAN) IV, zolpidem   Vital Signs    Vitals:   11/29/20 1540 11/29/20 2005 11/30/20 0018 11/30/20 0500  BP: (!) 147/109 (!) 132/92 (!) 146/96 135/85  Pulse: 65 63 65 63  Resp: 18 18 18 18   Temp: 98 F (36.7 C) 97.9 F (36.6 C)  98.6 F (37 C)  TempSrc: Oral Oral  Oral  SpO2: 98% 96% 99%   Weight:      Height:        Intake/Output Summary (Last 24 hours) at 11/30/2020 0806 Last data filed at 11/30/2020 0400 Gross per 24 hour  Intake 502.79 ml  Output 2150 ml  Net -1647.21 ml    Last 3 Weights 11/28/2020 05/28/2020 02/21/2020  Weight (lbs) 201 lb 9.6 oz 199 lb 12.8 oz 200 lb  Weight (kg) 91.445 kg 90.629 kg 90.719 kg      Telemetry    NSR- Personally Reviewed  ECG    Sinus bradycardia with LVH and repol- Personally Reviewed  Physical Exam   GEN: Well nourished, well developed in no acute distress HEENT: Normal NECK: No JVD; No carotid bruits LYMPHATICS: No lymphadenopathy CARDIAC:RRR, no murmurs, rubs, gallops RESPIRATORY:  Clear to auscultation without rales, wheezing or rhonchi  ABDOMEN: Soft, non-tender, non-distended MUSCULOSKELETAL:   No edema; No deformity  SKIN: Warm and dry NEUROLOGIC:  Alert and oriented x 3 PSYCHIATRIC:  Normal affect   Labs    High Sensitivity Troponin:   Recent Labs  Lab 11/29/20 0200 11/29/20 0523 11/29/20 0948 11/29/20 1121  TROPONINIHS 158* 157* 116* 94*       Chemistry Recent Labs  Lab 11/29/20 0202  NA 137  K 3.7  CL 109  CO2 23  GLUCOSE 81  BUN 14  CREATININE 1.30*  CALCIUM 9.0  GFRNONAA >60  ANIONGAP 5      Hematology Recent Labs  Lab 11/29/20 0202 11/30/20 0233  WBC 6.9 5.8  RBC 4.07* 4.44  HGB 12.7* 14.0  HCT 39.4 41.5  MCV 96.8 93.5  MCH 31.2 31.5  MCHC 32.2 33.7  RDW 14.3 13.9  PLT 200 206     BNPNo results for input(s): BNP, PROBNP in the last 168 hours.   DDimer No results for input(s): DDIMER in the last 168 hours.   Radiology    ECHOCARDIOGRAM COMPLETE  Result Date: 11/29/2020    ECHOCARDIOGRAM REPORT   Patient Name:   Benjamin Espinoza Date of Exam: 11/29/2020 Medical Rec #:  [id number]          Height:       73.0 in Accession #:  [id number]         Weight:       201.6 lb Date of Birth:  1965-03-23          BSA:          2.159 m Patient Age:    55 years           BP:           149/105 mmHg Patient Gender: M                  HR:           64 bpm. Exam Location:  Inpatient Procedure: 2D Echo, Cardiac Doppler and Color Doppler Indications:    Acute myocardial infarction, unspecified I21.9  History:        Patient has prior history of Echocardiogram examinations, most                 recent 05/08/2017. Previous Myocardial Infarction and CAD; Prior                 CABG.  Sonographer:    Roosvelt Maser RDCS Referring Phys: [id number] STEPHANIE FALK MARTIN IMPRESSIONS  1. Left ventricular ejection fraction, by estimation, is 50 to 55%. The left ventricle has low normal function. The left ventricle has no regional wall motion abnormalities. The left ventricular internal cavity size was mildly dilated. Left ventricular diastolic parameters are indeterminate.  2.  Right ventricular systolic function is normal. The right ventricular size is mildly enlarged. There is normal pulmonary artery systolic pressure.  3. Left atrial size was mild to moderately dilated.  4. The mitral valve is normal in structure. Mild mitral valve regurgitation. No evidence of mitral stenosis.  5. The aortic valve is normal in structure. Aortic valve regurgitation is mild. No aortic stenosis is present.  6. Aortic dilatation noted. There is mild dilatation of the aortic root, measuring 44 mm. There is mild dilatation of the ascending aorta, measuring 38 mm. FINDINGS  Left Ventricle: Left ventricular ejection fraction, by estimation, is 50 to 55%. The left ventricle has low normal function. The left ventricle has no regional wall motion abnormalities. The left ventricular internal cavity size was mildly dilated. There is no left ventricular hypertrophy. Left ventricular diastolic parameters are indeterminate. Normal left ventricular filling pressure. Right Ventricle: The right ventricular size is mildly enlarged. No increase in right ventricular wall thickness. Right ventricular systolic function is normal. There is normal pulmonary artery systolic pressure. The tricuspid regurgitant velocity is 2.65  m/s, and with an assumed right atrial pressure of 3 mmHg, the estimated right ventricular systolic pressure is 31.1 mmHg. Left Atrium: Left atrial size was mild to moderately dilated. Right Atrium: Right atrial size was normal in size. Pericardium: There is no evidence of pericardial effusion. Mitral Valve: The mitral valve is normal in structure. Mild mitral valve regurgitation. No evidence of mitral valve stenosis. Tricuspid Valve: The tricuspid valve is normal in structure. Tricuspid valve regurgitation is mild . No evidence of tricuspid stenosis. Aortic Valve: The aortic valve is normal in structure. Aortic valve regurgitation is mild. No aortic stenosis is present. Aortic valve mean gradient measures  2.0 mmHg. Aortic valve peak gradient measures 4.6 mmHg. Aortic valve area, by VTI measures 3.28 cm. Pulmonic Valve: The pulmonic valve was normal in structure. Pulmonic valve regurgitation is trivial. No evidence of pulmonic stenosis. Aorta: Aortic dilatation noted. There is mild dilatation of the aortic root, measuring 44 mm. There is mild dilatation of  the ascending aorta, measuring 38 mm. Venous: The inferior vena cava was not well visualized. IAS/Shunts: No atrial level shunt detected by color flow Doppler.  LEFT VENTRICLE PLAX 2D LVIDd:         5.70 cm  Diastology LVIDs:         4.10 cm  LV e' medial:    7.29 cm/s LV PW:         1.10 cm  LV E/e' medial:  14.7 LV IVS:        1.00 cm  LV e' lateral:   9.68 cm/s LVOT diam:     2.20 cm  LV E/e' lateral: 11.1 LV SV:         70 LV SV Index:   32 LVOT Area:     3.80 cm  RIGHT VENTRICLE RV Basal diam:  4.10 cm RV Mid diam:    3.30 cm LEFT ATRIUM            Index       RIGHT ATRIUM           Index LA diam:      4.60 cm  2.13 cm/m  RA Area:     20.80 cm LA Vol (A2C): 116.0 ml 53.73 ml/m RA Volume:   65.30 ml  30.25 ml/m LA Vol (A4C): 57.8 ml  26.77 ml/m  AORTIC VALVE AV Area (Vmax):    3.66 cm AV Area (Vmean):   3.46 cm AV Area (VTI):     3.28 cm AV Vmax:           107.00 cm/s AV Vmean:          70.000 cm/s AV VTI:            0.212 m AV Peak Grad:      4.6 mmHg AV Mean Grad:      2.0 mmHg LVOT Vmax:         103.00 cm/s LVOT Vmean:        63.800 cm/s LVOT VTI:          0.183 m LVOT/AV VTI ratio: 0.86  AORTA Ao Root diam: 4.40 cm Ao Asc diam:  3.80 cm MITRAL VALVE                TRICUSPID VALVE MV Area (PHT): 4.06 cm     TR Peak grad:   28.1 mmHg MV Decel Time: 187 msec     TR Vmax:        265.00 cm/s MV E velocity: 107.00 cm/s MV A velocity: 46.70 cm/s   SHUNTS MV E/A ratio:  2.29         Systemic VTI:  0.18 m                             Systemic Diam: 2.20 cm Armanda Magic MD Electronically signed by Armanda Magic MD Signature Date/Time: 11/29/2020/1:53:15 PM     Final     Cardiac Studies   Cardiac Cath 11/25/2020 LHC 11-26-19 Conclusions: Severe native coronary artery disease including 50% ostial LMCA stenosis, chronic total occlusions of the ostial, mid, and distal LAD, 80% stenosis of mid LCx as well as chronic total occlusion of OM1, and chronic total occlusion of the proximal through distal RCA.  There is also a 90% stenosis in the RPDA distal to the SVG anastomosis. Widely patent LIMA to LAD, supplying a relatively small territory as the distal LAD is chronically occluded.  Patent SVG to OM1 with 30% ostial/proximal stenosis. Widely patent SVG to OM 2. Patent SVG to RPDA with 70% ostial/proximal disease as well as 90% stenosis at the distal anastomosis extending into the RPDA. Normal left ventricular filling pressure with mildly to moderately reduced systolic function (LVEF ~45%). Successful PCI to distal anastomosis of SVG to RPDA extending into the RPDA with a Resolute Onyx 2.25 x 18 mm drug-eluting stent (postdilated to 2.9 mm proximally) with 0% residual stenosis and TIMI-3 flow. Successful PCI to ostial/proximal SVG to RPDA using Resolute Onyx 2.75 x 18 mm drug-eluting stent with 0% residual stenosis and TIMI-3 flow.     Recommendations: Continue dual antiplatelet therapy with aspirin and clopidogrel for at least 6 months, ideally longer. Continue aggressive secondary prevention as well as antianginal therapy with ranolazine. Anticipate same-day discharge this afternoon if no post catheterization complications.    Patient Profile     55 y.o. male with a hx of hx of coronary artery disease status post CABG in 2017 in [place]. on April 24, 2019 the patient did have unstable angina was transferred to [facility] where he underwent a left heart catheterization showing severe native dz w/ patent grafts. he subsequently underwent a work-up with nuclear stress test which was abnormal he was sent for cardiac catheterization, done on November 26, 2019 showing 70% ostial lesion in SVG to PDA .  this was treated with a resolute Onyx stent. He additionally has h/o  hypertension, hyperlipidemia, prediabetes, tobacco use and degenerative disc disease. He has PAD and is s/p R femoral artery bypass surgery in 2022.  Now admitted with chest pain and elevated hsTrop after running out of his meds including Plavix and statin for the past month.  Assessment & Plan    ASCAD/NSTEMI -s/p remote CABG in 2017 in [place] -cath 11/26/19 with 70% ostial SVG to PDA s/p resolute Onyx PCI -recently has been having SOB felt to be his anginal equivalent and was started on Ranexa and long acting nitrates -Plan by Dr. Servando Salina was cath if he continued to be symptomatic despite uptitration of anti anginal meds -recently ran out of statin and Plavix a month ago -hsTrop mildly elevated with flat trend -he has 2 types of chest pain.  His burning pain is is anginal equivalent that he had prior to CABG and resolved after surgery and now has reoccurred with any activity and came back last night after his O2 fell off.  He also has a chronic chest pressure that is constant ever since his CABG years ago and never goes away.  -cannot increase BB further due to bradycardia -continue Ranexa 1000mg  BID, IV Heparing gtt  -had to stop IV NTG gtt due to severe HA>>he tells me that he could not take the Imdur either due to HA -continue ASA, Plavix 75mg  daily and statin/Repatha -plan for heart cath tomorrow -Shared Decision Making/Informed Consent The risks [stroke (1 in 1000), death (1 in 1000), kidney failure [usually temporary] (1 in 500), bleeding (1 in 200), allergic reaction [possibly serious] (1 in 200)], benefits (diagnostic support and management of coronary artery disease) and alternatives of a cardiac catheterization were discussed in detail with Benjamin Espinoza and he is willing to proceed.   2.  HTN -BP stable this am at 135/37mmHg but DBP still running high some -cannot titrate BB  due to bradycardia>continue Toprol XL 25mg  daily -SCr bumped to 1.3 so holding ARB -continue Amlodipine 5mg  daily  and titrate as needed for HTN>> will also  help with angina -BMET today  3.  HLD -LDL goal < 70 -he ran out of his statin a month ago -LDL 142 and HDL 40 this admit and says that he did not get his Repatha in the mail last month but just got it and took it this week -Increased Lipitor to 80mg  daily this admission(he says that he has not had any problems with statins in the past) -restarted Repatha at home recently -repeat FLp and ALT in 8 weeks  4.  AKI -SCr 1.04 a year ago and bumped to 1.3 yesterday -holding ARB -check BMET today  I have spent a total of 35 minutes with patient reviewing cardiac cath, office notes , telemetry, EKGs, labs and examining patient as well as establishing an assessment and plan that was discussed with the patient.  > 50% of time was spent in direct patient care.     For questions or updates, please contact [facility] Please consult [email or web address] for contact info under        Signed, , MD  11/30/2020, 8:06 AM

## 2020-11-30 NOTE — H&P (View-Only) (Signed)
 Progress Note  Patient Name: Siddiq S Hancock Date of Encounter: 11/30/2020  [facility] Cardiologist: Kardie Tobb, DO   Subjective   Stopped NTG yesterday due to HA and HA is resolved.  hsTrop elevated at 158>157>116>94.  O2 fell off last night and he had 6/10 chest burning which is now resolved after Morphine  Inpatient Medications    Scheduled Meds:  amLODipine  5 mg Oral Daily   aspirin EC  81 mg Oral Daily   atorvastatin  80 mg Oral Daily   clopidogrel  75 mg Oral Q breakfast   metoprolol succinate  25 mg Oral Daily   nicotine  21 mg Transdermal Daily   ranolazine  1,000 mg Oral BID   sodium chloride flush  3 mL Intravenous Q12H   Continuous Infusions:  heparin 1,100 Units/hr (11/29/20 1433)   PRN Meds: acetaminophen, alum & mag hydroxide-simeth, hydrocortisone cream, morphine injection, ondansetron (ZOFRAN) IV, zolpidem   Vital Signs    Vitals:   11/29/20 1540 11/29/20 2005 11/30/20 0018 11/30/20 0500  BP: (!) 147/109 (!) 132/92 (!) 146/96 135/85  Pulse: 65 63 65 63  Resp: 18 18 18 18  Temp: 98 F (36.7 C) 97.9 F (36.6 C)  98.6 F (37 C)  TempSrc: Oral Oral  Oral  SpO2: 98% 96% 99%   Weight:      Height:        Intake/Output Summary (Last 24 hours) at 11/30/2020 0806 Last data filed at 11/30/2020 0400 Gross per 24 hour  Intake 502.79 ml  Output 2150 ml  Net -1647.21 ml    Last 3 Weights 11/28/2020 05/28/2020 02/21/2020  Weight (lbs) 201 lb 9.6 oz 199 lb 12.8 oz 200 lb  Weight (kg) 91.445 kg 90.629 kg 90.719 kg      Telemetry    NSR- Personally Reviewed  ECG    Sinus bradycardia with LVH and repol- Personally Reviewed  Physical Exam   GEN: Well nourished, well developed in no acute distress HEENT: Normal NECK: No JVD; No carotid bruits LYMPHATICS: No lymphadenopathy CARDIAC:RRR, no murmurs, rubs, gallops RESPIRATORY:  Clear to auscultation without rales, wheezing or rhonchi  ABDOMEN: Soft, non-tender, non-distended MUSCULOSKELETAL:   No edema; No deformity  SKIN: Warm and dry NEUROLOGIC:  Alert and oriented x 3 PSYCHIATRIC:  Normal affect   Labs    High Sensitivity Troponin:   Recent Labs  Lab 11/29/20 0200 11/29/20 0523 11/29/20 0948 11/29/20 1121  TROPONINIHS 158* 157* 116* 94*       Chemistry Recent Labs  Lab 11/29/20 0202  NA 137  K 3.7  CL 109  CO2 23  GLUCOSE 81  BUN 14  CREATININE 1.30*  CALCIUM 9.0  GFRNONAA >60  ANIONGAP 5      Hematology Recent Labs  Lab 11/29/20 0202 11/30/20 0233  WBC 6.9 5.8  RBC 4.07* 4.44  HGB 12.7* 14.0  HCT 39.4 41.5  MCV 96.8 93.5  MCH 31.2 31.5  MCHC 32.2 33.7  RDW 14.3 13.9  PLT 200 206     BNPNo results for input(s): BNP, PROBNP in the last 168 hours.   DDimer No results for input(s): DDIMER in the last 168 hours.   Radiology    ECHOCARDIOGRAM COMPLETE  Result Date: 11/29/2020    ECHOCARDIOGRAM REPORT   Patient Name:   Marques S Hoefle Date of Exam: 11/29/2020 Medical Rec #:  [id number]          Height:       73.0 in Accession #:      [id number]         Weight:       201.6 lb Date of Birth:  1965-03-23          BSA:          2.159 m Patient Age:    55 years           BP:           149/105 mmHg Patient Gender: M                  HR:           64 bpm. Exam Location:  Inpatient Procedure: 2D Echo, Cardiac Doppler and Color Doppler Indications:    Acute myocardial infarction, unspecified I21.9  History:        Patient has prior history of Echocardiogram examinations, most                 recent 05/08/2017. Previous Myocardial Infarction and CAD; Prior                 CABG.  Sonographer:    Roosvelt Maser RDCS Referring Phys: [id number] STEPHANIE FALK MARTIN IMPRESSIONS  1. Left ventricular ejection fraction, by estimation, is 50 to 55%. The left ventricle has low normal function. The left ventricle has no regional wall motion abnormalities. The left ventricular internal cavity size was mildly dilated. Left ventricular diastolic parameters are indeterminate.  2.  Right ventricular systolic function is normal. The right ventricular size is mildly enlarged. There is normal pulmonary artery systolic pressure.  3. Left atrial size was mild to moderately dilated.  4. The mitral valve is normal in structure. Mild mitral valve regurgitation. No evidence of mitral stenosis.  5. The aortic valve is normal in structure. Aortic valve regurgitation is mild. No aortic stenosis is present.  6. Aortic dilatation noted. There is mild dilatation of the aortic root, measuring 44 mm. There is mild dilatation of the ascending aorta, measuring 38 mm. FINDINGS  Left Ventricle: Left ventricular ejection fraction, by estimation, is 50 to 55%. The left ventricle has low normal function. The left ventricle has no regional wall motion abnormalities. The left ventricular internal cavity size was mildly dilated. There is no left ventricular hypertrophy. Left ventricular diastolic parameters are indeterminate. Normal left ventricular filling pressure. Right Ventricle: The right ventricular size is mildly enlarged. No increase in right ventricular wall thickness. Right ventricular systolic function is normal. There is normal pulmonary artery systolic pressure. The tricuspid regurgitant velocity is 2.65  m/s, and with an assumed right atrial pressure of 3 mmHg, the estimated right ventricular systolic pressure is 31.1 mmHg. Left Atrium: Left atrial size was mild to moderately dilated. Right Atrium: Right atrial size was normal in size. Pericardium: There is no evidence of pericardial effusion. Mitral Valve: The mitral valve is normal in structure. Mild mitral valve regurgitation. No evidence of mitral valve stenosis. Tricuspid Valve: The tricuspid valve is normal in structure. Tricuspid valve regurgitation is mild . No evidence of tricuspid stenosis. Aortic Valve: The aortic valve is normal in structure. Aortic valve regurgitation is mild. No aortic stenosis is present. Aortic valve mean gradient measures  2.0 mmHg. Aortic valve peak gradient measures 4.6 mmHg. Aortic valve area, by VTI measures 3.28 cm. Pulmonic Valve: The pulmonic valve was normal in structure. Pulmonic valve regurgitation is trivial. No evidence of pulmonic stenosis. Aorta: Aortic dilatation noted. There is mild dilatation of the aortic root, measuring 44 mm. There is mild dilatation of  the ascending aorta, measuring 38 mm. Venous: The inferior vena cava was not well visualized. IAS/Shunts: No atrial level shunt detected by color flow Doppler.  LEFT VENTRICLE PLAX 2D LVIDd:         5.70 cm  Diastology LVIDs:         4.10 cm  LV e' medial:    7.29 cm/s LV PW:         1.10 cm  LV E/e' medial:  14.7 LV IVS:        1.00 cm  LV e' lateral:   9.68 cm/s LVOT diam:     2.20 cm  LV E/e' lateral: 11.1 LV SV:         70 LV SV Index:   32 LVOT Area:     3.80 cm  RIGHT VENTRICLE RV Basal diam:  4.10 cm RV Mid diam:    3.30 cm LEFT ATRIUM            Index       RIGHT ATRIUM           Index LA diam:      4.60 cm  2.13 cm/m  RA Area:     20.80 cm LA Vol (A2C): 116.0 ml 53.73 ml/m RA Volume:   65.30 ml  30.25 ml/m LA Vol (A4C): 57.8 ml  26.77 ml/m  AORTIC VALVE AV Area (Vmax):    3.66 cm AV Area (Vmean):   3.46 cm AV Area (VTI):     3.28 cm AV Vmax:           107.00 cm/s AV Vmean:          70.000 cm/s AV VTI:            0.212 m AV Peak Grad:      4.6 mmHg AV Mean Grad:      2.0 mmHg LVOT Vmax:         103.00 cm/s LVOT Vmean:        63.800 cm/s LVOT VTI:          0.183 m LVOT/AV VTI ratio: 0.86  AORTA Ao Root diam: 4.40 cm Ao Asc diam:  3.80 cm MITRAL VALVE                TRICUSPID VALVE MV Area (PHT): 4.06 cm     TR Peak grad:   28.1 mmHg MV Decel Time: 187 msec     TR Vmax:        265.00 cm/s MV E velocity: 107.00 cm/s MV A velocity: 46.70 cm/s   SHUNTS MV E/A ratio:  2.29         Systemic VTI:  0.18 m                             Systemic Diam: 2.20 cm Armanda Magic MD Electronically signed by Armanda Magic MD Signature Date/Time: 11/29/2020/1:53:15 PM     Final     Cardiac Studies   Cardiac Cath 11/25/2020 [facility] 11-26-19 Conclusions: Severe native coronary artery disease including 50% ostial LMCA stenosis, chronic total occlusions of the ostial, mid, and distal LAD, 80% stenosis of mid LCx as well as chronic total occlusion of OM1, and chronic total occlusion of the proximal through distal RCA.  There is also a 90% stenosis in the RPDA distal to the SVG anastomosis. Widely patent LIMA to LAD, supplying a relatively small territory as the distal LAD is chronically occluded.  Patent SVG to OM1 with 30% ostial/proximal stenosis. Widely patent SVG to OM 2. Patent SVG to RPDA with 70% ostial/proximal disease as well as 90% stenosis at the distal anastomosis extending into the RPDA. Normal left ventricular filling pressure with mildly to moderately reduced systolic function (LVEF ~45%). Successful PCI to distal anastomosis of SVG to RPDA extending into the RPDA with a Resolute Onyx 2.25 x 18 mm drug-eluting stent (postdilated to 2.9 mm proximally) with 0% residual stenosis and TIMI-3 flow. Successful PCI to ostial/proximal SVG to RPDA using Resolute Onyx 2.75 x 18 mm drug-eluting stent with 0% residual stenosis and TIMI-3 flow.     Recommendations: Continue dual antiplatelet therapy with aspirin and clopidogrel for at least 6 months, ideally longer. Continue aggressive secondary prevention as well as antianginal therapy with ranolazine. Anticipate same-day discharge this afternoon if no post catheterization complications.    Patient Profile     55 y.o. male with a hx of hx of coronary artery disease status post CABG in 2017 in [place]. on April 24, 2019 the patient did have unstable angina was transferred to [facility] where he underwent a left heart catheterization showing severe native dz w/ patent grafts. he subsequently underwent a work-up with nuclear stress test which was abnormal he was sent for cardiac catheterization, done on November 26, 2019 showing 70% ostial lesion in SVG to PDA .  this was treated with a resolute Onyx stent. He additionally has h/o  hypertension, hyperlipidemia, prediabetes, tobacco use and degenerative disc disease. He has PAD and is s/p R femoral artery bypass surgery in 2022.  Now admitted with chest pain and elevated hsTrop after running out of his meds including Plavix and statin for the past month.  Assessment & Plan    ASCAD/NSTEMI -s/p remote CABG in 2017 in [place] -cath 11/26/19 with 70% ostial SVG to PDA s/p resolute Onyx PCI -recently has been having SOB felt to be his anginal equivalent and was started on Ranexa and long acting nitrates -Plan by Dr. Servando Salina was cath if he continued to be symptomatic despite uptitration of anti anginal meds -recently ran out of statin and Plavix a month ago -hsTrop mildly elevated with flat trend -he has 2 types of chest pain.  His burning pain is is anginal equivalent that he had prior to CABG and resolved after surgery and now has reoccurred with any activity and came back last night after his O2 fell off.  He also has a chronic chest pressure that is constant ever since his CABG years ago and never goes away.  -cannot increase BB further due to bradycardia -continue Ranexa 1000mg  BID, IV Heparing gtt  -had to stop IV NTG gtt due to severe HA>>he tells me that he could not take the Imdur either due to HA -continue ASA, Plavix 75mg  daily and statin/Repatha -plan for heart cath tomorrow -Shared Decision Making/Informed Consent The risks [stroke (1 in 1000), death (1 in 1000), kidney failure [usually temporary] (1 in 500), bleeding (1 in 200), allergic reaction [possibly serious] (1 in 200)], benefits (diagnostic support and management of coronary artery disease) and alternatives of a cardiac catheterization were discussed in detail with Mr. Riner and he is willing to proceed.   2.  HTN -BP stable this am at 135/37mmHg but DBP still running high some -cannot titrate BB  due to bradycardia>continue Toprol XL 25mg  daily -SCr bumped to 1.3 so holding ARB -continue Amlodipine 5mg  daily  and titrate as needed for HTN>> will also  help with angina -BMET today  3.  HLD -LDL goal < 70 -he ran out of his statin a month ago -LDL 142 and HDL 40 this admit and says that he did not get his Repatha in the mail last month but just got it and took it this week -Increased Lipitor to 80mg  daily this admission(he says that he has not had any problems with statins in the past) -restarted Repatha at home recently -repeat FLp and ALT in 8 weeks  4.  AKI -SCr 1.04 a year ago and bumped to 1.3 yesterday -holding ARB -check BMET today  I have spent a total of 35 minutes with patient reviewing cardiac cath, office notes , telemetry, EKGs, labs and examining patient as well as establishing an assessment and plan that was discussed with the patient.  > 50% of time was spent in direct patient care.     For questions or updates, please contact [facility] Please consult [email or web address] for contact info under        Signed, , MD  11/30/2020, 8:06 AM

## 2020-12-01 ENCOUNTER — Inpatient Hospital Stay (HOSPITAL_COMMUNITY)
Admission: AD | Disposition: A | Discharge status: Home / Self Care | Payer: Self-pay | Source: Other Acute Inpatient Hospital | Attending: Cardiology

## 2020-12-01 DIAGNOSIS — I25810 Atherosclerosis of coronary artery bypass graft(s) without angina pectoris: Secondary | ICD-10-CM

## 2020-12-01 HISTORY — PX: LEFT HEART CATH AND CORS/GRAFTS ANGIOGRAPHY: CATH118250

## 2020-12-01 LAB — HEPARIN LEVEL (UNFRACTIONATED): Heparin Unfractionated: 0.3 IU/mL (ref 0.30–0.70)

## 2020-12-01 LAB — CBC
HCT: 43.5 % (ref 39.0–52.0)
Hemoglobin: 14.5 g/dL (ref 13.0–17.0)
MCH: 31.1 pg (ref 26.0–34.0)
MCHC: 33.3 g/dL (ref 30.0–36.0)
MCV: 93.3 fL (ref 80.0–100.0)
Platelets: 214 10*3/uL (ref 150–400)
RBC: 4.66 MIL/uL (ref 4.22–5.81)
RDW: 13.8 % (ref 11.5–15.5)
WBC: 6.2 10*3/uL (ref 4.0–10.5)
nRBC: 0 % (ref 0.0–0.2)

## 2020-12-01 LAB — POCT ACTIVATED CLOTTING TIME: Activated Clotting Time: 103 seconds

## 2020-12-01 LAB — GLUCOSE, CAPILLARY: Glucose-Capillary: 86 mg/dL (ref 70–99)

## 2020-12-01 SURGERY — LEFT HEART CATH AND CORS/GRAFTS ANGIOGRAPHY
Anesthesia: LOCAL

## 2020-12-01 MED ORDER — LABETALOL HCL 5 MG/ML IV SOLN: 10.0000 mg | INTRAVENOUS | Status: AC | PRN

## 2020-12-01 MED ORDER — ASPIRIN 81 MG PO CHEW: 81.0000 mg | CHEWABLE_TABLET | Freq: Every day | ORAL | Status: DC

## 2020-12-01 MED ORDER — HEPARIN (PORCINE) IN NACL 1000-0.9 UT/500ML-% IV SOLN
INTRAVENOUS | Status: AC
  Filled 2020-12-01: qty 1000

## 2020-12-01 MED ORDER — FENTANYL CITRATE (PF) 100 MCG/2ML IJ SOLN
INTRAMUSCULAR | Status: DC | PRN
  Administered 2020-12-01: 25 ug via INTRAVENOUS
  Administered 2020-12-01: 50 ug via INTRAVENOUS

## 2020-12-01 MED ORDER — METOPROLOL SUCCINATE ER 25 MG PO TB24: 25.0000 mg | ORAL_TABLET | Freq: Every day | ORAL | 3 refills | Status: DC

## 2020-12-01 MED ORDER — IOHEXOL 350 MG/ML SOLN
INTRAVENOUS | Status: DC | PRN
  Administered 2020-12-01: 135 mL

## 2020-12-01 MED ORDER — FENTANYL CITRATE (PF) 100 MCG/2ML IJ SOLN
25.0000 ug | Freq: Once | INTRAMUSCULAR | Status: AC
  Administered 2020-12-01: 25 ug via INTRAVENOUS

## 2020-12-01 MED ORDER — SODIUM CHLORIDE 0.9 % IV SOLN: INTRAVENOUS | Status: AC

## 2020-12-01 MED ORDER — ONDANSETRON HCL 4 MG/2ML IJ SOLN: 4.0000 mg | Freq: Four times a day (QID) | INTRAMUSCULAR | Status: DC | PRN

## 2020-12-01 MED ORDER — AMLODIPINE BESYLATE 5 MG PO TABS: 5.0000 mg | ORAL_TABLET | Freq: Every day | ORAL | 6 refills | Status: DC

## 2020-12-01 MED ORDER — SODIUM CHLORIDE 0.9% FLUSH
3.0000 mL | Freq: Two times a day (BID) | INTRAVENOUS | Status: DC
  Administered 2020-12-01: 3 mL via INTRAVENOUS

## 2020-12-01 MED ORDER — VERAPAMIL HCL 2.5 MG/ML IV SOLN
INTRAVENOUS | Status: AC
  Filled 2020-12-01: qty 2

## 2020-12-01 MED ORDER — FENTANYL CITRATE (PF) 100 MCG/2ML IJ SOLN
INTRAMUSCULAR | Status: AC
  Filled 2020-12-01: qty 2

## 2020-12-01 MED ORDER — MIDAZOLAM HCL 2 MG/2ML IJ SOLN
INTRAMUSCULAR | Status: AC
  Filled 2020-12-01: qty 2

## 2020-12-01 MED ORDER — CLOPIDOGREL BISULFATE 75 MG PO TABS: 75.0000 mg | ORAL_TABLET | Freq: Every day | ORAL | Status: DC

## 2020-12-01 MED ORDER — SODIUM CHLORIDE 0.9% FLUSH: 3.0000 mL | INTRAVENOUS | Status: DC | PRN

## 2020-12-01 MED ORDER — LIDOCAINE HCL (PF) 1 % IJ SOLN
INTRAMUSCULAR | Status: DC | PRN
  Administered 2020-12-01: 15 mL

## 2020-12-01 MED ORDER — ATORVASTATIN CALCIUM 80 MG PO TABS: 80.0000 mg | ORAL_TABLET | Freq: Every day | ORAL | 6 refills | Status: DC

## 2020-12-01 MED ORDER — MIDAZOLAM HCL 2 MG/2ML IJ SOLN
1.0000 mg | Freq: Once | INTRAMUSCULAR | Status: AC
  Administered 2020-12-01: 1 mg via INTRAVENOUS

## 2020-12-01 MED ORDER — DIAZEPAM 5 MG PO TABS: 5.0000 mg | ORAL_TABLET | ORAL | Status: DC | PRN

## 2020-12-01 MED ORDER — SODIUM CHLORIDE 0.9 % IV SOLN: 250.0000 mL | INTRAVENOUS | Status: DC | PRN

## 2020-12-01 MED ORDER — MIDAZOLAM HCL 2 MG/2ML IJ SOLN
INTRAMUSCULAR | Status: DC | PRN
  Administered 2020-12-01: 2 mg via INTRAVENOUS

## 2020-12-01 MED ORDER — ACETAMINOPHEN 325 MG PO TABS: 650.0000 mg | ORAL_TABLET | ORAL | Status: DC | PRN

## 2020-12-01 MED ORDER — LIDOCAINE HCL (PF) 1 % IJ SOLN
INTRAMUSCULAR | Status: AC
  Filled 2020-12-01: qty 30

## 2020-12-01 MED ORDER — HEPARIN SODIUM (PORCINE) 5000 UNIT/ML IJ SOLN: 5000.0000 [IU] | Freq: Three times a day (TID) | INTRAMUSCULAR | Status: DC

## 2020-12-01 MED ORDER — HYDRALAZINE HCL 20 MG/ML IJ SOLN: 10.0000 mg | INTRAMUSCULAR | Status: AC | PRN

## 2020-12-01 MED ORDER — HEPARIN (PORCINE) IN NACL 1000-0.9 UT/500ML-% IV SOLN
INTRAVENOUS | Status: DC | PRN
  Administered 2020-12-01 (×2): 500 mL

## 2020-12-01 SURGICAL SUPPLY — 15 items
CATH INFINITI 5 FR IM (CATHETERS) ×1 IMPLANT
CATH INFINITI 5 FR JR5 (CATHETERS) ×1 IMPLANT
CATH INFINITI 5 FR MPA2 (CATHETERS) ×1 IMPLANT
CATH INFINITI 5 FR RCB (CATHETERS) ×1 IMPLANT
CATH INFINITI 5FR MULTPACK ANG (CATHETERS) ×1 IMPLANT
GUIDEWIRE INQWIRE 1.5J.035X260 (WIRE) IMPLANT
INQWIRE 1.5J .035X260CM (WIRE) ×2
KIT HEART LEFT (KITS) ×4 IMPLANT
PACK CARDIAC CATHETERIZATION (CUSTOM PROCEDURE TRAY) ×4 IMPLANT
SHEATH PINNACLE 5F 10CM (SHEATH) ×1 IMPLANT
SHEATH PROBE COVER 6X72 (BAG) ×1 IMPLANT
SYR MEDRAD MARK 7 150ML (SYRINGE) ×2 IMPLANT
TRANSDUCER W/STOPCOCK (MISCELLANEOUS) ×4 IMPLANT
TUBING CIL FLEX 10 FLL-RA (TUBING) ×4 IMPLANT
WIRE J 3MM .035X145CM (WIRE) ×1 IMPLANT

## 2020-12-01 NOTE — Progress Notes (Signed)
Patient came back from cath lab and stated that Dr. Was not sure of plan but he couldn't schedule him again for Thursday 12/04/2020. Patient very addiment about not staying in the hospital until Thursday. Also worried insurance would not cover hospital stay. Patient wanted to go home, patient wanted to be discharged today. Messaged doctor at 1330 and called the doctor at 1630 to discuss plan for patient. Patient states "he would like to go home" and would come back for procedure whenever its scheduled. Wife did verify via phone she would transport him back to the hospital for procedure.

## 2020-12-01 NOTE — Discharge Summary (Signed)
Discharge Summary    Patient ID: Benjamin Espinoza MRN: [id number]; DOB: 21-Apr-1965  Admit date: 11/28/2020 Discharge date: 12/01/2020  PCP:  Teena Irani, PA-C   [facility] Providers Cardiologist:  Thomasene Ripple, DO   {   Discharge Diagnoses    Active Problems:   NSTEMI (non-ST elevated myocardial infarction) (HCC)   CAD   HTN   HLD   PAD   Diagnostic Studies/Procedures    Echo 11/29/20  1. Left ventricular ejection fraction, by estimation, is 50 to 55%. The  left ventricle has low normal function. The left ventricle has no regional  wall motion abnormalities. The left ventricular internal cavity size was  mildly dilated. Left ventricular  diastolic parameters are indeterminate.   2. Right ventricular systolic function is normal. The right ventricular  size is mildly enlarged. There is normal pulmonary artery systolic  pressure.   3. Left atrial size was mild to moderately dilated.   4. The mitral valve is normal in structure. Mild mitral valve  regurgitation. No evidence of mitral stenosis.   5. The aortic valve is normal in structure. Aortic valve regurgitation is  mild. No aortic stenosis is present.   6. Aortic dilatation noted. There is mild dilatation of the aortic root,  measuring 44 mm. There is mild dilatation of the ascending aorta,  measuring 38 mm.   LEFT HEART CATH AND CORS/GRAFTS ANGIOGRAPHY   Conclusion      Ost LAD to Mid LAD lesion is 100% stenosed.   Prox RCA lesion is 90% stenosed.   Prox RCA to Dist RCA lesion is 100% stenosed.   Dist LAD-1 lesion is 100% stenosed.   Dist LAD-2 lesion is 85% stenosed.   Prox Cx to Mid Cx lesion is 100% stenosed.   1st Mrg-1 lesion is 95% stenosed.   1st Mrg-2 lesion is 95% stenosed.   Origin to Prox Graft lesion is 40% stenosed.   1st Mrg-3 lesion is 95% stenosed.   1st Mrg-4 lesion is 90% stenosed.   Ost LM to Mid LM lesion is 50% stenosed.   Prox Graft lesion is 20% stenosed.   LV end diastolic  pressure is normal.   Severe multivessel native coronary artery disease with 50% diffuse left main stenosis, total LAD occlusion at the ostium, high-grade segmental stenoses in the OM1 vessel proximal to and distal to the SVG anastomosis with total occlusion of the AV groove circumflex after the OM1 takeoff and 90% proximal and total occlusion of the proximal to mid native RCA with evidence for left to right distal collateralization via the left coronary system.   Patent LIMA to LAD with previously noted chronic total occlusion of the LAD moderate segment beyond the anastomosis with small caliber distal LAD vessel after the CTO which is mildly collateralized.   Patent SVG to the left circumflex OM 1 vessel with 40% eccentric proximal graft stenosis.  The OM vessel immediately after the anastomosis is 95% stenosed followed by diffuse 90% stenosis and is a very small caliber distal vessel.   Patent SVG supplying the OM 2 vessel with mild 20% narrowing in the proximal third of the graft.  The AV groove left circumflex fills retrograde to the proximal total occlusion arising distal to the OM1 takeoff.   Occluded saphenous vein graft which was previously stented in September 2021 with total occlusion of the ostial SVG stent with TIMI 0 flow.   Follow moderate LV dysfunction with EF estimated 40% with mid-distal inferior significant hypocontractility.  LVEDP 15 mmHg.   RECOMMENDATION: Angiograms will be reviewed with colleagues.  Compared to his September 2021 study, the vein graft supplying the RCA which was stented ostially and distally is now totally occluded.  There is significant progression of OM1 stenoses beyond the SVG anastomosis and the vessel is very small caliber.  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  Most likely will require increased medical regimen but will review with CTO colleagues regarding potential attempt at LAD CTO intervention.     Diagnostic Dominance: Right   History of Present Illness     Benjamin Espinoza is a 55 y.o. male with CAD s/p CABG,  hypertension, hyperlipidemia, PAD, prediabetes, tobacco use and degenerative disc disease seen for NSTEMI.   Hx of hx of coronary artery disease status post CABG in 2017 in [place]. on April 24, 2019 the patient did have unstable angina was transferred to [facility] where he underwent a left heart catheterization showing severe native dz w/ patent grafts. he subsequently underwent a work-up with nuclear stress test which was abnormal he was sent for cardiac catheterization, done on November 26, 2019 showing 70% ostial lesion in SVG to PDA .  This was treated with a resolute Onyx stent.  He has PAD and is s/p R femoral artery bypass surgery in 2022.  Benjamin Espinoza presented to the ED at [facility] c/o CP described as a L-sided "pressure" sensation that has been intermittently bothering him for the past couple weeks. The sx are worse w/ exertion, and he has associated SOB/DOE. Pt had no acute ST elevation but did have TWI in the lateral/anterolateral leads more prominent than prior tracings, and troponin (standard) was found to be 0.18 in setting of mild AKI w/ Creatinine 1.4 (baseline Cr 0.9 on 06/08/20). Troponin was never repeated. He was transferred to [facility] for further evaluation on IV heparin.  Pt adds the additional info this evening that he has been out of several medications, including statin and plavix, for the past month.  Hospital Course     Consultants: None   ASCAD/NSTEMI -recently has been having SOB felt to be his anginal equivalent and was started on Ranexa and long acting nitrates. Plan by Dr. Servando Salina was cath if he continued to be symptomatic despite uptitration of anti anginal meds.  -recently ran out of statin and Plavix a month ago. Also concern about compliance with Renexa and Torpol XL (reported that he is not taking) -hsTrop mildly elevated with flat  trend -he has 2 types of chest pain.  His burning pain is is anginal equivalent that he had prior to CABG and resolved after surgery and now has reoccurred with any activity and came back last night after his O2 fell off.  He also has a chronic chest pressure that is constant ever since his CABG years ago and never goes away.  -cannot increase BB further due to bradycardia  -continue Ranexa 1000mg  BID, IV Heparing gtt  -had to stop IV NTG gtt due to severe HA>>he tells me that he could not take the Imdur either due to HA -continue ASA, Plavix 75mg  daily and statin/Repatha - Cath showed totally occluded SVG to RCA. There is significant progression of OM1 stenoses beyond the SVG anastomosis and the vessel is very small caliber.  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  He is cath films reviewed by interventionalist team (Dr. , Dr. ,  Dr. Okey Dupre and Dr. Thukkani)>>> did not felt candidate for intervention. -Plan to treat medically.  Amlodipine added this admission.  Recommended compliance with home medication.  He will continue aspirin, statin, beta-blocker and Ranexa. -Ambulated in hallway prior to discharge without chest pain.  2.  HTN -Blood pressure stable on current medications   3.  HLD -LDL goal < 70 -11/29/2020: Cholesterol 210; HDL 40; LDL Cholesterol 142; Triglycerides 141; VLDL 28  -he ran out of his statin a month ago -Patient says that he did not get his Repatha in the mail last month but just got it and took it this week -Increased Lipitor to 80mg  daily this admission(he says that he has not had any problems with statins in the past) -restarted Repatha at home recently -repeat FLp and ALT in 8 weeks   4.  AKI -Resolved. BMP in 1-2 weeks during follow up. Message sent to clinic.   Did the patient have an acute coronary syndrome (MI, NSTEMI, STEMI, etc) this admission?:  No                               Did the patient have a  percutaneous coronary intervention (stent / angioplasty)?:  No.       _____________  Discharge Vitals Blood pressure (!) 146/85, pulse 70, temperature 98 F (36.7 C), temperature source Oral, resp. rate 13, height 6\' 1"  (1.854 m), weight 90.9 kg, SpO2 97 %.  Filed Weights   11/28/20 2005 11/30/20 0815  Weight: 91.4 kg 90.9 kg    Labs & Radiologic Studies    CBC Recent Labs    11/30/20 0233 12/01/20 0151  WBC 5.8 6.2  HGB 14.0 14.5  HCT 41.5 43.5  MCV 93.5 93.3  PLT 206 214   Basic Metabolic Panel Recent Labs    01/30/21 0202 11/30/20 0238  NA 137 138  K 3.7 3.9  CL 109 105  CO2 23 25  GLUCOSE 81 91  BUN 14 7  CREATININE 1.30* 1.21  CALCIUM 9.0 9.4    High Sensitivity Troponin:   Recent Labs  Lab 11/29/20 0200 11/29/20 0523 11/29/20 0948 11/29/20 1121  TROPONINIHS 158* 157* 116* 94*     Hemoglobin A1C Recent Labs    11/29/20 0203  HGBA1C 5.3   Fasting Lipid Panel Recent Labs    11/29/20 0204  CHOL 210*  HDL 40*  LDLCALC 142*  TRIG 141  CHOLHDL 5.3   Thyroid Function Tests Recent Labs    11/29/20 0201  TSH 1.909   _____________  CARDIAC CATHETERIZATION  Result Date: 12/01/2020   Ost LAD to Mid LAD lesion is 100% stenosed.   Prox RCA lesion is 90% stenosed.   Prox RCA to Dist RCA lesion is 100% stenosed.   Dist LAD-1 lesion is 100% stenosed.   Dist LAD-2 lesion is 85% stenosed.   Prox Cx to Mid Cx lesion is 100% stenosed.   1st Mrg-1 lesion is 95% stenosed.   1st Mrg-2 lesion is 95% stenosed.   Origin to Prox Graft lesion is 40% stenosed.   1st Mrg-3 lesion is 95% stenosed.   1st Mrg-4 lesion is 90% stenosed.   Ost LM to Mid LM lesion is 50% stenosed.   Prox Graft lesion is 20% stenosed.   LV end diastolic pressure is normal. Severe multivessel native coronary artery disease with 50% diffuse left main stenosis, total LAD occlusion at the ostium, high-grade segmental stenoses in  the OM1 vessel proximal to and distal to the SVG anastomosis with  total occlusion of the AV groove circumflex after the OM1 takeoff and 90% proximal and total occlusion of the proximal to mid native RCA with evidence for left to right distal collateralization via the left coronary system. Patent LIMA to LAD with previously noted chronic total occlusion of the LAD moderate segment beyond the anastomosis with small caliber distal LAD vessel after the CTO which is mildly collateralized. Patent SVG to the left circumflex OM 1 vessel with 40% eccentric proximal graft stenosis.  The OM vessel immediately after the anastomosis is 95% stenosed followed by diffuse 90% stenosis and is a very small caliber distal vessel. Patent SVG supplying the OM 2 vessel with mild 20% narrowing in the proximal third of the graft.  The AV groove left circumflex fills retrograde to the proximal total occlusion arising distal to the OM1 takeoff. Occluded saphenous vein graft which was previously stented in September 2021 with total occlusion of the ostial SVG stent with TIMI 0 flow. Follow moderate LV dysfunction with EF estimated 40% with mid-distal inferior significant hypocontractility.  LVEDP 15 mmHg. RECOMMENDATION: Angiograms will be reviewed with colleagues.  Compared to his September 2021 study, the vein graft supplying the RCA which was stented ostially and distally is now totally occluded.  There is significant progression of OM1 stenoses beyond the SVG anastomosis and the vessel is very small caliber.  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  Most likely will require increased medical regimen but will review with CTO colleagues regarding potential attempt at LAD CTO intervention.   ECHOCARDIOGRAM COMPLETE  Result Date: 11/29/2020    ECHOCARDIOGRAM REPORT   Patient Name:   Benjamin Espinoza Date of Exam: 11/29/2020 Medical Rec #:  [id number]          Height:       73.0 in Accession #:    [id number]         Weight:       201.6 lb Date of Birth:   February 15, 1966          BSA:          2.159 m Patient Age:    55 years           BP:           149/105 mmHg Patient Gender: M                  HR:           64 bpm. Exam Location:  Inpatient Procedure: 2D Echo, Cardiac Doppler and Color Doppler Indications:    Acute myocardial infarction, unspecified I21.9  History:        Patient has prior history of Echocardiogram examinations, most                 recent 05/08/2017. Previous Myocardial Infarction and CAD; Prior                 CABG.  Sonographer:    Roosvelt Maser RDCS Referring Phys: [id number] STEPHANIE FALK MARTIN IMPRESSIONS  1. Left ventricular ejection fraction, by estimation, is 50 to 55%. The left ventricle has low normal function. The left ventricle has no regional wall motion abnormalities. The left ventricular internal cavity size was mildly dilated. Left ventricular diastolic parameters are indeterminate.  2. Right ventricular systolic function is normal. The right ventricular size is mildly enlarged. There is normal  pulmonary artery systolic pressure.  3. Left atrial size was mild to moderately dilated.  4. The mitral valve is normal in structure. Mild mitral valve regurgitation. No evidence of mitral stenosis.  5. The aortic valve is normal in structure. Aortic valve regurgitation is mild. No aortic stenosis is present.  6. Aortic dilatation noted. There is mild dilatation of the aortic root, measuring 44 mm. There is mild dilatation of the ascending aorta, measuring 38 mm. FINDINGS  Left Ventricle: Left ventricular ejection fraction, by estimation, is 50 to 55%. The left ventricle has low normal function. The left ventricle has no regional wall motion abnormalities. The left ventricular internal cavity size was mildly dilated. There is no left ventricular hypertrophy. Left ventricular diastolic parameters are indeterminate. Normal left ventricular filling pressure. Right Ventricle: The right ventricular size is mildly enlarged. No increase in right  ventricular wall thickness. Right ventricular systolic function is normal. There is normal pulmonary artery systolic pressure. The tricuspid regurgitant velocity is 2.65  m/s, and with an assumed right atrial pressure of 3 mmHg, the estimated right ventricular systolic pressure is 31.1 mmHg. Left Atrium: Left atrial size was mild to moderately dilated. Right Atrium: Right atrial size was normal in size. Pericardium: There is no evidence of pericardial effusion. Mitral Valve: The mitral valve is normal in structure. Mild mitral valve regurgitation. No evidence of mitral valve stenosis. Tricuspid Valve: The tricuspid valve is normal in structure. Tricuspid valve regurgitation is mild . No evidence of tricuspid stenosis. Aortic Valve: The aortic valve is normal in structure. Aortic valve regurgitation is mild. No aortic stenosis is present. Aortic valve mean gradient measures 2.0 mmHg. Aortic valve peak gradient measures 4.6 mmHg. Aortic valve area, by VTI measures 3.28 cm. Pulmonic Valve: The pulmonic valve was normal in structure. Pulmonic valve regurgitation is trivial. No evidence of pulmonic stenosis. Aorta: Aortic dilatation noted. There is mild dilatation of the aortic root, measuring 44 mm. There is mild dilatation of the ascending aorta, measuring 38 mm. Venous: The inferior vena cava was not well visualized. IAS/Shunts: No atrial level shunt detected by color flow Doppler.  LEFT VENTRICLE PLAX 2D LVIDd:         5.70 cm  Diastology LVIDs:         4.10 cm  LV e' medial:    7.29 cm/s LV PW:         1.10 cm  LV E/e' medial:  14.7 LV IVS:        1.00 cm  LV e' lateral:   9.68 cm/s LVOT diam:     2.20 cm  LV E/e' lateral: 11.1 LV SV:         70 LV SV Index:   32 LVOT Area:     3.80 cm  RIGHT VENTRICLE RV Basal diam:  4.10 cm RV Mid diam:    3.30 cm LEFT ATRIUM            Index       RIGHT ATRIUM           Index LA diam:      4.60 cm  2.13 cm/m  RA Area:     20.80 cm LA Vol (A2C): 116.0 ml 53.73 ml/m RA Volume:    65.30 ml  30.25 ml/m LA Vol (A4C): 57.8 ml  26.77 ml/m  AORTIC VALVE AV Area (Vmax):    3.66 cm AV Area (Vmean):   3.46 cm AV Area (VTI):     3.28 cm AV Vmax:  107.00 cm/s AV Vmean:          70.000 cm/s AV VTI:            0.212 m AV Peak Grad:      4.6 mmHg AV Mean Grad:      2.0 mmHg LVOT Vmax:         103.00 cm/s LVOT Vmean:        63.800 cm/s LVOT VTI:          0.183 m LVOT/AV VTI ratio: 0.86  AORTA Ao Root diam: 4.40 cm Ao Asc diam:  3.80 cm MITRAL VALVE                TRICUSPID VALVE MV Area (PHT): 4.06 cm     TR Peak grad:   28.1 mmHg MV Decel Time: 187 msec     TR Vmax:        265.00 cm/s MV E velocity: 107.00 cm/s MV A velocity: 46.70 cm/s   SHUNTS MV E/A ratio:  2.29         Systemic VTI:  0.18 m                             Systemic Diam: 2.20 cm Armanda Magic MD Electronically signed by Armanda Magic MD Signature Date/Time: 11/29/2020/1:53:15 PM    Final    Disposition   Pt is being discharged home today in good condition.  Follow-up Plans & Appointments     Follow-up Information     Tobb, Kardie, DO Follow up.   Specialty: Cardiology Why: office will call with date and time Contact information: [address] [place] [address] [phone]                Discharge Instructions     Diet - low sodium heart healthy   Complete by: As directed    Discharge instructions   Complete by: As directed    No driving for 48. No lifting over 5 lbs for 1 week. No sexual activity for 1 week. You may return to work on after seen by cardiologist. Keep procedure site clean & dry. If you notice increased pain, swelling, bleeding or pus, call/return!  You may shower, but no soaking baths/hot tubs/pools for 1 week.   Increase activity slowly   Complete by: As directed        Discharge Medications   Allergies as of 12/01/2020       Reactions   Imdur [isosorbide Nitrate] Other (See Comments)   Severe headache        Medication List     TAKE these medications     amLODipine 5 MG tablet Commonly known as: NORVASC Take 1 tablet (5 mg total) by mouth daily. Start taking on: December 02, 2020   aspirin EC 81 MG tablet Take 2 tablets (162 mg total) by mouth daily.   atorvastatin 80 MG tablet Commonly known as: LIPITOR Take 1 tablet (80 mg total) by mouth daily. Start taking on: December 02, 2020   clopidogrel 75 MG tablet Commonly known as: PLAVIX Take 1 tablet (75 mg total) by mouth daily.   gabapentin 600 MG tablet Commonly known as: NEURONTIN Take 600 mg by mouth 2 (two) times daily as needed (Pain).   losartan 25 MG tablet Commonly known as: COZAAR Take 1 tablet (25 mg total) by mouth daily.   metoprolol succinate 25 MG 24 hr tablet Commonly known as: Toprol  XL Take 1 tablet (25 mg total) by mouth daily.   oxyCODONE-acetaminophen 5-325 MG tablet Commonly known as: PERCOCET/ROXICET Take 1 tablet by mouth every 4 (four) hours as needed for severe pain.   pantoprazole 40 MG tablet Commonly known as: PROTONIX Take 1 tablet (40 mg total) by mouth daily.   ranolazine 1000 MG SR tablet Commonly known as: Ranexa Take 1 tablet (1,000 mg total) by mouth 2 (two) times daily.   Repatha SureClick 140 MG/ML Soaj Generic drug: Evolocumab Inject the contents of 1 pen (140 mg) under the skin every fourteen (14) days. What changed: See the new instructions.           Outstanding Labs/Studies   None  Duration of Discharge Encounter   Greater than 30 minutes including physician time.  Lorelei Pont, PA 12/01/2020, 6:16 PM

## 2020-12-01 NOTE — Interval H&P Note (Signed)
Cath Lab Visit (complete for each Cath Lab visit)  Clinical Evaluation Leading to the Procedure:   ACS: No.  Non-ACS:    Anginal Classification: CCS III  Anti-ischemic medical therapy: Maximal Therapy (2 or more classes of medications)  Non-Invasive Test Results: No non-invasive testing performed  Prior CABG: Previous CABG      History and Physical Interval Note:  12/01/2020 7:46 AM  Benjamin Espinoza  has presented today for surgery, with the diagnosis of NSTEMI.  The various methods of treatment have been discussed with the patient and family. After consideration of risks, benefits and other options for treatment, the patient has consented to  Procedure(s): LEFT HEART CATH AND CORONARY ANGIOGRAPHY (N/A) as a surgical intervention.  The patient's history has been reviewed, patient examined, no change in status, stable for surgery.  I have reviewed the patient's chart and labs.  Questions were answered to the patient's satisfaction.     Nicki Guadalajara

## 2020-12-01 NOTE — Progress Notes (Signed)
Site area: rt groin fa sheath [place] Jury Site Prior to Removal:  Level 0 Pressure Applied For: 20 minutes Manual:   yes Patient Status During Pull:  stable Post Pull Site:  Level 0 Post Pull Instructions Given:  yes Post Pull Pulses Present: rt dp palpable Dressing Applied:  gauze and tegaderm Bedrest begins @ 1020 Comments:

## 2020-12-01 NOTE — Progress Notes (Signed)
Pt had ambulated in the hallway 400 feet. No c/o chest pain  will ambulating. No c/o of SOB. Pt states, " I don't have chest pain, it is only when I walk in an incline that I have chest pain, I don't feel it rught now as we are walking, I am not even short of breath".  Pt discharge as per instruction  by PA-c Bavinkumar if no c/o of SOB or chest pain.

## 2020-12-01 NOTE — Plan of Care (Signed)

## 2020-12-02 ENCOUNTER — Telehealth: Payer: Self-pay

## 2020-12-02 ENCOUNTER — Encounter (HOSPITAL_COMMUNITY): Payer: Self-pay | Admitting: Cardiovascular Disease

## 2020-12-02 ENCOUNTER — Other Ambulatory Visit: Payer: Self-pay | Admitting: Cardiology

## 2020-12-02 NOTE — Telephone Encounter (Signed)
Pt aware that Dr. Servando Salina has moved to the [facility] office and pt request to stay with Dr. Servando Salina. Pt states that he is on another call with [facility] making an appointment.

## 2020-12-02 NOTE — Telephone Encounter (Signed)
-----   Message from [place], [place] sent at 12/01/2020  6:25 PM EDT ----- Please schedule this patient for a follow-up appointment.  Primary Cardiologist: Dr. Servando Salina Date of Discharge: 12/01/2020 Appointment Needed Within: 7-10 days  Appointment Type: TOC follow with blood work (BMP)  Thank you! Chelsea Aus, PA-C

## 2020-12-03 NOTE — Unmapped (Signed)
[employer] 's Repatha shipment will be canceled  as a result of prior authorization now approved. Unable reach patient to reschedule.    I have reached out to the patient  at [phone] and was unable to leave a message. We will not reschedule the medication and have removed this/these medication(s) from the work request.  We have canceled this work request.     Patient's voicemail not set up. Call attempts made 10/3, 10/4, 10/5.

## 2020-12-08 MED FILL — Verapamil HCl IV Soln 2.5 MG/ML: INTRAVENOUS | Qty: 2 | Status: AC

## 2020-12-09 ENCOUNTER — Ambulatory Visit: Payer: Medicare HMO | Admitting: Medical

## 2020-12-12 ENCOUNTER — Telehealth: Payer: Self-pay | Admitting: Physician Assistant

## 2020-12-12 NOTE — Telephone Encounter (Signed)
   Benjamin Espinoza DOB: [birth year] MRN: [id number]   RIDER WAIVER AND RELEASE OF LIABILITY  For purposes of improving physical access to our facilities, [facility] is pleased to partner with third parties to provide [facility] [place] patients or other authorized individuals the option of convenient, on-demand ground transportation services (the Chiropractor") through use of the technology service that enables users to request on-demand ground transportation from independent third-party providers.  By opting to use and accept these [employer], I, the undersigned, hereby agree on behalf of myself, and on behalf of any minor child using the Science writer for whom I am the parent or legal guardian, as follows:  Science writer provided to me are provided by independent third-party transportation providers who are not [employer] or employees and who are unaffiliated with [employer]. [facility] is neither a transportation carrier nor a common or public carrier. [facility] has no control over the quality or safety of the transportation that occurs as a result of the [employer]. [facility] cannot guarantee that any third-party transportation provider will complete any arranged transportation service. [facility] makes no representation, warranty, or guarantee regarding the reliability, timeliness, quality, safety, suitability, or availability of any of the Transport Services or that they will be error free. I fully understand that traveling by vehicle involves risks and dangers of serious bodily injury, including permanent disability, paralysis, and death. I agree, on behalf of myself and on behalf of any minor child using the Transport Services for whom I am the parent or legal guardian, that the entire risk arising out of my use of the [employer] remains solely with me, to the maximum extent permitted under applicable law. The [employer] are provided  "as is" and "as available." [facility] disclaims all representations and warranties, express, implied or statutory, not expressly set out in these terms, including the implied warranties of merchantability and fitness for a particular purpose. I hereby waive and release [facility], its agents, employees, officers, directors, representatives, insurers, attorneys, assigns, successors, subsidiaries, and affiliates from any and all past, present, or future claims, demands, liabilities, actions, causes of action, or suits of any kind directly or indirectly arising from acceptance and use of the [employer]. I further waive and release  and its affiliates from all present and future liability and responsibility for any injury or death to persons or damages to property caused by or related to the use of the [employer]. I have read this Waiver and Release of Liability, and I understand the terms used in it and their legal significance. This Waiver is freely and voluntarily given with the understanding that my right (as well as the right of any minor child for whom I am the parent or legal guardian using the [employer]) to legal recourse against  in connection with the [employer] is knowingly surrendered in return for use of these services.   I attest that I read the consent document to Benjamin Espinoza, gave Benjamin Espinoza the opportunity to ask questions and answered the questions asked (if any). I affirm that Benjamin Espinoza then provided consent for he's participation in this program.     Benjamin Espinoza

## 2020-12-15 NOTE — Unmapped (Signed)
The [facility] has made a second and final attempt to reach this patient to refill the following medication:Repatha.      We have left voicemails on the following phone numbers: [phone].    Dates contacted: 12/09/20, 12/15/20  Last scheduled delivery: 09/02/20    The patient may be at risk of non-compliance with this medication. The patient should call the [facility] at [phone]  Option 4, then Option 2 (all other specialty patients) to refill medication.    Mayuri Staples Celedonio Savage   [facility]

## 2020-12-17 ENCOUNTER — Encounter: Payer: Self-pay | Admitting: Cardiology

## 2020-12-17 ENCOUNTER — Ambulatory Visit (INDEPENDENT_AMBULATORY_CARE_PROVIDER_SITE_OTHER): Payer: Medicare HMO | Admitting: Cardiology

## 2020-12-17 ENCOUNTER — Other Ambulatory Visit: Payer: Self-pay

## 2020-12-17 VITALS — BP 118/80 | HR 70 | Ht 73.0 in | Wt 200.2 lb

## 2020-12-17 DIAGNOSIS — E782 Mixed hyperlipidemia: Secondary | ICD-10-CM

## 2020-12-17 DIAGNOSIS — I25708 Atherosclerosis of coronary artery bypass graft(s), unspecified, with other forms of angina pectoris: Secondary | ICD-10-CM | POA: Diagnosis not present

## 2020-12-17 DIAGNOSIS — I255 Ischemic cardiomyopathy: Secondary | ICD-10-CM

## 2020-12-17 DIAGNOSIS — Z951 Presence of aortocoronary bypass graft: Secondary | ICD-10-CM | POA: Diagnosis not present

## 2020-12-17 DIAGNOSIS — F17200 Nicotine dependence, unspecified, uncomplicated: Secondary | ICD-10-CM

## 2020-12-17 DIAGNOSIS — I10 Essential (primary) hypertension: Secondary | ICD-10-CM | POA: Diagnosis not present

## 2020-12-17 DIAGNOSIS — J449 Chronic obstructive pulmonary disease, unspecified: Secondary | ICD-10-CM

## 2020-12-17 DIAGNOSIS — I739 Peripheral vascular disease, unspecified: Secondary | ICD-10-CM | POA: Diagnosis not present

## 2020-12-17 HISTORY — DX: Ischemic cardiomyopathy: I25.5

## 2020-12-17 NOTE — Progress Notes (Signed)
Cardiology Office Note:    Date:  12/17/2020   ID:  Benjamin Espinoza, DOB 08-21-1965, MRN [id number]  PCP:  Teena Irani, PA-C  Cardiologist:  Thomasene Ripple, DO  Electrophysiologist:  None   Referring MD: Teena Irani, PA-C   " I am short of breath"  History of Present Illness:    Benjamin Espinoza is a 55 y.o. male with a hx of coronary artery disease status post CABG in 2017 in [place], on April 24, 2019 the patient did have unstable angina was transferred to [facility] where he underwent a left heart catheterization showing all his grafts were patent, he recently underwent a work-up with nuclear stress test which was abnormal he was sent for cardiac catheterization which he did on November 26, 2019 at that time he was found to have 70% ostial lesion in his SVG to PDA and this was treated with a resolute Onyx stent,  on 12/01/2020 he underwent a LHC which now showed that his SVG which was stented in 10/2019 is now occluded, hypertension, hyperlipidemia, prediabetes, tobacco use and degenerative disc disease.    I saw the patient in November 29, 2019 at that time I recommended that he follow-up in 3 months unfortunately he did not.       In march 2022, he was seen at the emergency department at [facility] where he presented on May 24, 2020 at that time he complained of abdominal pain.  He had a chest x-ray as well as an abdominal x-ray which was reported to be normal - he was discharged home.   I saw the patient on   Since his last visit he was admitted and underwent a LHC which now showed that his SVG which was stented in 10/2019 is now occluded with discussion of the LAD CTO.   He is here today for his follow up visit. He tells me he is struggling trying to quit smoking. He says" I know the cigarettes are making thing worse doc". He reports some shortness of breath on exertion.   Past Medical History:  Diagnosis Date   Abnormal stress test    Anxiety    Asthma     as a child   Atypical chest pain 05/07/2017   Coronary artery disease    DDD (degenerative disc disease), lumbar 11/08/2019   Formatting of this note might be different from the original. 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 [place]   Hyperlipidemia    Myocardial infarction [facility]) 2017   PVD (peripheral vascular disease) (HCC) 09/21/2019   Shortness of breath    Tobacco use disorder     Past Surgical History:  Procedure Laterality Date   ABDOMINAL AORTOGRAM W/LOWER EXTREMITY Bilateral 09/21/2019   Procedure: ABDOMINAL AORTOGRAM W/LOWER EXTREMITY;  Surgeon: [name], MD;  Location: [facility] INVASIVE CV LAB;  Service: Cardiovascular;  Laterality: Bilateral;   ABDOMINAL AORTOGRAM W/LOWER EXTREMITY N/A 12/07/2019   Procedure: ABDOMINAL AORTOGRAM W/LOWER EXTREMITY;  Surgeon: [name], MD;  Location: [facility] INVASIVE CV LAB;  Service: Cardiovascular;  Laterality: N/A;   CARDIAC CATHETERIZATION     CORONARY ARTERY BYPASS GRAFT     quadruple bypas 2017   CORONARY STENT INTERVENTION N/A 11/26/2019   Procedure: CORONARY STENT INTERVENTION;  Surgeon: Yvonne Kendall, MD;  Location: [facility] INVASIVE CV LAB;  Service: Cardiovascular;  Laterality: N/A;   FEMORAL-POPLITEAL BYPASS GRAFT Right 12/25/2019   Procedure: BYPASS GRAFT FEMORAL- BELOW KNEE  POPLITEAL ARTERY RIGHT USING GORE PROPATEN VASCULAR GRAFT;  Surgeon: [name], MD;  Location: [facility] OR;  Service: Vascular;  Laterality: Right;   LEFT HEART CATH AND CORS/GRAFTS ANGIOGRAPHY N/A 11/26/2019   Procedure: LEFT HEART CATH AND CORS/GRAFTS ANGIOGRAPHY;  Surgeon: Yvonne Kendall, MD;  Location: [facility] INVASIVE CV LAB;  Service: Cardiovascular;  Laterality: N/A;   LEFT HEART CATH AND CORS/GRAFTS ANGIOGRAPHY N/A 12/01/2020   Procedure: LEFT HEART CATH AND CORS/GRAFTS ANGIOGRAPHY;  Surgeon: Lennette Bihari, MD;  Location: [facility] INVASIVE CV LAB;  Service: Cardiovascular;  Laterality: N/A;   LOWER  EXTREMITY ANGIOGRAM Right 12/25/2019   Procedure: RIGHT LOWER EXTREMITY ANGIOGRAM;  Surgeon: [name], MD;  Location: [facility] OR;  Service: Vascular;  Laterality: Right;   PERIPHERAL VASCULAR BALLOON ANGIOPLASTY Left 12/07/2019   Procedure: PERIPHERAL VASCULAR BALLOON ANGIOPLASTY;  Surgeon: [name], MD;  Location: [facility] INVASIVE CV LAB;  Service: Cardiovascular;  Laterality: Left;  SFA    Current Medications: Current Meds  Medication Sig   amLODipine (NORVASC) 5 MG tablet Take 1 tablet (5 mg total) by mouth daily.   aspirin EC 81 MG tablet Take 2 tablets (162 mg total) by mouth daily.   atorvastatin (LIPITOR) 80 MG tablet Take 1 tablet (80 mg total) by mouth daily.   clopidogrel (PLAVIX) 75 MG tablet Take 1 tablet (75 mg total) by mouth daily.   gabapentin (NEURONTIN) 600 MG tablet Take 600 mg by mouth 2 (two) times daily as needed (Pain).    losartan (COZAAR) 25 MG tablet TAKE 1 TABLET(25 MG) BY MOUTH DAILY   losartan (COZAAR) 25 MG tablet Take 1 tablet by mouth daily.   metoprolol succinate (TOPROL XL) 25 MG 24 hr tablet Take 1 tablet (25 mg total) by mouth daily.   oxyCODONE-acetaminophen (PERCOCET/ROXICET) 5-325 MG tablet Take 1 tablet by mouth every 4 (four) hours as needed for severe pain.   pantoprazole (PROTONIX) 40 MG tablet Take 1 tablet (40 mg total) by mouth daily.   ranolazine (RANEXA) 1000 MG SR tablet Take 1 tablet (1,000 mg total) by mouth 2 (two) times daily.   REPATHA SURECLICK 140 MG/ML SOAJ Inject the contents of 1 pen (140 mg) under the skin every fourteen (14) days. (Patient taking differently: Inject 1 pen into the skin every 14 (fourteen) days.)   Current Facility-Administered Medications for the 12/17/20 encounter (Office Visit) with Thomasene Ripple, DO  Medication   sodium chloride flush (NS) 0.9 % injection 3 mL     Allergies:   Imdur [isosorbide nitrate]   Social History   Socioeconomic History   Marital status: Married    Spouse name: Not  on file   Number of children: Not on file   Years of education: Not on file   Highest education level: Not on file  Occupational History   Not on file  Tobacco Use   Smoking status: Every Day    Years: 25.00    Types: Cigars, Cigarettes   Smokeless tobacco: Never   Tobacco comments:    as of 12/20/19 2 cigararettes/day  Vaping Use   Vaping Use: Never used  Substance and Sexual Activity   Alcohol use: No   Drug use: No   Sexual activity: Not on file  Other Topics Concern   Not on file  Social History Narrative   Not on file   Social Determinants of Health   Financial Resource Strain: Not on file  Food Insecurity: Not on file  Transportation Needs: Not on  file  Physical Activity: Not on file  Stress: Not on file  Social Connections: Not on file     Family History: The patient's family history includes Heart disease in his father, mother, and sister.  ROS:   Review of Systems  Constitution: Negative for decreased appetite, fever and weight gain.  HENT: Negative for congestion, ear discharge, hoarse voice and sore throat.   Eyes: Negative for discharge, redness, vision loss in right eye and visual halos.  Cardiovascular: Negative for chest pain, dyspnea on exertion, leg swelling, orthopnea and palpitations.  Respiratory: Negative for cough, hemoptysis, shortness of breath and snoring.   Endocrine: Negative for heat intolerance and polyphagia.  Hematologic/Lymphatic: Negative for bleeding problem. Does not bruise/bleed easily.  Skin: Negative for flushing, nail changes, rash and suspicious lesions.  Musculoskeletal: Negative for arthritis, joint pain, muscle cramps, myalgias, neck pain and stiffness.  Gastrointestinal: Negative for abdominal pain, bowel incontinence, diarrhea and excessive appetite.  Genitourinary: Negative for decreased libido, genital sores and incomplete emptying.  Neurological: Negative for brief paralysis, focal weakness, headaches and loss of  balance.  Psychiatric/Behavioral: Negative for altered mental status, depression and suicidal ideas.  Allergic/Immunologic: Negative for HIV exposure and persistent infections.    EKGs/Labs/Other Studies Reviewed:    The following studies were reviewed today:   EKG:  The ekg ordered today demonstrates    [facility] 12/01/2020   Ost LAD to Mid LAD lesion is 100% stenosed.   Prox RCA lesion is 90% stenosed.   Prox RCA to Dist RCA lesion is 100% stenosed.   Dist LAD-1 lesion is 100% stenosed.   Dist LAD-2 lesion is 85% stenosed.   Prox Cx to Mid Cx lesion is 100% stenosed.   1st Mrg-1 lesion is 95% stenosed.   1st Mrg-2 lesion is 95% stenosed.   Origin to Prox Graft lesion is 40% stenosed.   1st Mrg-3 lesion is 95% stenosed.   1st Mrg-4 lesion is 90% stenosed.   Ost LM to Mid LM lesion is 50% stenosed.   Prox Graft lesion is 20% stenosed.   LV end diastolic pressure is normal.   Severe multivessel native coronary artery disease with 50% diffuse left main stenosis, total LAD occlusion at the ostium, high-grade segmental stenoses in the OM1 vessel proximal to and distal to the SVG anastomosis with total occlusion of the AV groove circumflex after the OM1 takeoff and 90% proximal and total occlusion of the proximal to mid native RCA with evidence for left to right distal collateralization via the left coronary system.   Patent LIMA to LAD with previously noted chronic total occlusion of the LAD moderate segment beyond the anastomosis with small caliber distal LAD vessel after the CTO which is mildly collateralized.   Patent SVG to the left circumflex OM 1 vessel with 40% eccentric proximal graft stenosis.  The OM vessel immediately after the anastomosis is 95% stenosed followed by diffuse 90% stenosis and is a very small caliber distal vessel.   Patent SVG supplying the OM 2 vessel with mild 20% narrowing in the proximal third of the graft.  The AV groove left circumflex fills retrograde to the  proximal total occlusion arising distal to the OM1 takeoff.   Occluded saphenous vein graft which was previously stented in September 2021 with total occlusion of the ostial SVG stent with TIMI 0 flow.   Follow moderate LV dysfunction with EF estimated 40% with mid-distal inferior significant hypocontractility.  LVEDP 15 mmHg.   RECOMMENDATION: Angiograms will be reviewed  with colleagues.  Compared to his September 2021 study, the vein graft supplying the RCA which was stented ostially and distally is now totally occluded.  There is significant progression of OM1 stenoses beyond the SVG anastomosis and the vessel is very small caliber.  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  Most likely will require increased medical regimen but will review with CTO colleagues regarding potential attempt at LAD CTO intervention.  Recent Labs: 12/20/2019: ALT 20 11/29/2020: TSH 1.909 11/30/2020: BUN 7; Creatinine, Ser 1.21; Potassium 3.9; Sodium 138 12/01/2020: Hemoglobin 14.5; Platelets 214  Recent Lipid Panel    Component Value Date/Time   CHOL 210 (H) 11/29/2020 0204   TRIG 141 11/29/2020 0204   HDL 40 (L) 11/29/2020 0204   CHOLHDL 5.3 11/29/2020 0204   VLDL 28 11/29/2020 0204   LDLCALC 142 (H) 11/29/2020 0204    Physical Exam:    VS:  BP 118/80   Pulse 70   Ht 6\' 1"  (1.854 m)   Wt 200 lb 3.2 oz (90.8 kg)   SpO2 99%   BMI 26.41 kg/m     Wt Readings from Last 3 Encounters:  12/17/20 200 lb 3.2 oz (90.8 kg)  11/30/20 200 lb 6.4 oz (90.9 kg)  05/28/20 199 lb 12.8 oz (90.6 kg)     GEN: Well nourished, well developed in no acute distress HEENT: Normal NECK: No JVD; No carotid bruits LYMPHATICS: No lymphadenopathy CARDIAC: S1S2 noted,RRR, no murmurs, rubs, gallops RESPIRATORY:  Clear to auscultation without rales, wheezing or rhonchi  ABDOMEN: Soft, non-tender, non-distended, +bowel sounds, no guarding. EXTREMITIES: No edema, No cyanosis, no  clubbing MUSCULOSKELETAL:  No deformity  SKIN: Warm and dry NEUROLOGIC:  Alert and oriented x 3, non-focal PSYCHIATRIC:  Normal affect, good insight  ASSESSMENT:    1. Coronary artery disease involving coronary bypass graft of native heart with other forms of angina pectoris (HCC)   2. Essential hypertension   3. PVD (peripheral vascular disease) (HCC)   4. Hx of CABG   5. Mixed hyperlipidemia   6. Tobacco use disorder   7. Ischemic cardiomyopathy   8. Chronic obstructive pulmonary disease, unspecified COPD type (HCC)    PLAN:     Recent LHC showed new disease. Unfortunately he had not been taking his medication as prescribed he reports that it ran out he did not get the chance to pick it up. He continues to smoke. Today we discuss about the cath findings. No angina symptoms. He usually angina symptoms is chest pain.   Will continue his current medication regimen. I will also reach out to our CTO team asking their advice and guidance on his case.  He knows he needs to stop smoking. He is agreeable to be referred to our smoking cessation program. Will send the referral.  He is short of breath but also has untreated COPD, I will refer him to Pulmonary as well.  His blood pressure is acceptable in the office today, continue with same medications.   The patient is in agreement with the above plan. The patient left the office in stable condition.  The patient will follow up in 12 weeks.   Medication Adjustments/Labs and Tests Ordered: Current medicines are reviewed at length with the patient today.  Concerns regarding medicines are outlined above.  Orders Placed This Encounter  Procedures   Ambulatory referral to Smoking Cessation Program   Ambulatory referral to Pulmonology   EKG 12-Lead  No orders of the defined types were placed in this encounter.   Patient Instructions  Medication Instructions:  Your physician recommends that you continue on your current medications as  directed. Please refer to the Current Medication list given to you today.  *If you need a refill on your cardiac medications before your next appointment, please call your pharmacy*   Lab Work: None If you have labs (blood work) drawn today and your tests are completely normal, you will receive your results only by: MyChart Message (if you have MyChart) OR A paper copy in the mail If you have any lab test that is abnormal or we need to change your treatment, we will call you to review the results.   Testing/Procedures: None   Follow-Up: At [facility], you and your health needs are our priority.  As part of our continuing mission to provide you with exceptional heart care, we have created designated Provider Care Teams.  These Care Teams include your primary Cardiologist (physician) and Advanced Practice Providers (APPs -  Physician Assistants and Nurse Practitioners) who all work together to provide you with the care you need, when you need it.  We recommend signing up for the patient portal called "MyChart".  Sign up information is provided on this After Visit Summary.  MyChart is used to connect with patients for Virtual Visits (Telemedicine).  Patients are able to view lab/test results, encounter notes, upcoming appointments, etc.  Non-urgent messages can be sent to your provider as well.   To learn more about what you can do with MyChart, go to [email or web address].    Your next appointment:   12 week(s)  The format for your next appointment:   In Person  Provider:   Thomasene Ripple, DO [address], [place], [place] [address]    Other Instructions     Adopting a Healthy Lifestyle.  Know what a healthy weight is for you (roughly BMI <25) and aim to maintain this   Aim for 7+ servings of fruits and vegetables daily   65-80+ fluid ounces of water or unsweet tea for healthy kidneys   Limit to max 1 drink of alcohol per day; avoid smoking/tobacco   Limit animal  fats in diet for cholesterol and heart health - choose grass fed whenever available   Avoid highly processed foods, and foods high in saturated/trans fats   Aim for low stress - take time to unwind and care for your mental health   Aim for 150 min of moderate intensity exercise weekly for heart health, and weights twice weekly for bone health   Aim for 7-9 hours of sleep daily   When it comes to diets, agreement about the perfect plan isnt easy to find, even among the experts. Experts at the [facility] [employer] developed an idea known as the Healthy Eating Plate. Just imagine a plate divided into logical, healthy portions.   The emphasis is on diet quality:   Load up on vegetables and fruits - one-half of your plate: Aim for color and variety, and remember that potatoes dont count.   Go for whole grains - one-quarter of your plate: Whole wheat, barley, wheat berries, quinoa, oats, brown rice, and foods made with them. If you want pasta, go with whole wheat pasta.   Protein power - one-quarter of your plate: Fish, chicken, beans, and nuts are all healthy, versatile protein sources. Limit red meat.   The diet, however, does go beyond the plate, offering a  few other suggestions.   Use healthy plant oils, such as olive, canola, soy, corn, sunflower and peanut. Check the labels, and avoid partially hydrogenated oil, which have unhealthy trans fats.   If youre thirsty, drink water. Coffee and tea are good in moderation, but skip sugary drinks and limit milk and dairy products to one or two daily servings.   The type of carbohydrate in the diet is more important than the amount. Some sources of carbohydrates, such as vegetables, fruits, whole grains, and beans-are healthier than others.   Finally, stay active  Signed, Thomasene Ripple, DO  12/17/2020 6:34 PM    [facility]

## 2020-12-17 NOTE — Patient Instructions (Signed)
Medication Instructions:  Your physician recommends that you continue on your current medications as directed. Please refer to the Current Medication list given to you today.  *If you need a refill on your cardiac medications before your next appointment, please call your pharmacy*   Lab Work: None If you have labs (blood work) drawn today and your tests are completely normal, you will receive your results only by: MyChart Message (if you have MyChart) OR A paper copy in the mail If you have any lab test that is abnormal or we need to change your treatment, we will call you to review the results.   Testing/Procedures: None   Follow-Up: At [facility], you and your health needs are our priority.  As part of our continuing mission to provide you with exceptional heart care, we have created designated Provider Care Teams.  These Care Teams include your primary Cardiologist (physician) and Advanced Practice Providers (APPs -  Physician Assistants and Nurse Practitioners) who all work together to provide you with the care you need, when you need it.  We recommend signing up for the patient portal called "MyChart".  Sign up information is provided on this After Visit Summary.  MyChart is used to connect with patients for Virtual Visits (Telemedicine).  Patients are able to view lab/test results, encounter notes, upcoming appointments, etc.  Non-urgent messages can be sent to your provider as well.   To learn more about what you can do with MyChart, go to [email or web address].    Your next appointment:   12 week(s)  The format for your next appointment:   In Person  Provider:   Kardie Tobb, DO [address], [place], [place] [address]    Other Instructions   

## 2021-01-19 ENCOUNTER — Emergency Department (HOSPITAL_COMMUNITY)
Admission: EM | Admit: 2021-01-19 | Discharge: 2021-01-19 | Disposition: A | Discharge status: Home / Self Care | Payer: Medicare HMO | Attending: Emergency Medicine | Admitting: Emergency Medicine

## 2021-01-19 ENCOUNTER — Emergency Department (HOSPITAL_COMMUNITY): Payer: Medicare HMO

## 2021-01-19 ENCOUNTER — Other Ambulatory Visit: Payer: Self-pay

## 2021-01-19 DIAGNOSIS — M79602 Pain in left arm: Secondary | ICD-10-CM | POA: Diagnosis not present

## 2021-01-19 DIAGNOSIS — Z5321 Procedure and treatment not carried out due to patient leaving prior to being seen by health care provider: Secondary | ICD-10-CM | POA: Insufficient documentation

## 2021-01-19 DIAGNOSIS — R079 Chest pain, unspecified: Secondary | ICD-10-CM | POA: Diagnosis not present

## 2021-01-19 LAB — BASIC METABOLIC PANEL
Anion gap: 5 (ref 5–15)
BUN: 11 mg/dL (ref 6–20)
CO2: 26 mmol/L (ref 22–32)
Calcium: 9.8 mg/dL (ref 8.9–10.3)
Chloride: 107 mmol/L (ref 98–111)
Creatinine, Ser: 1.2 mg/dL (ref 0.61–1.24)
GFR, Estimated: >60 mL/min (ref >60)
Glucose, Bld: 97 mg/dL (ref 70–99)
Potassium: 4.5 mmol/L (ref 3.5–5.1)
Sodium: 138 mmol/L (ref 135–145)

## 2021-01-19 LAB — CBC
HCT: 42.2 % (ref 39.0–52.0)
Hemoglobin: 13.7 g/dL (ref 13.0–17.0)
MCH: 30.9 pg (ref 26.0–34.0)
MCHC: 32.5 g/dL (ref 30.0–36.0)
MCV: 95 fL (ref 80.0–100.0)
Platelets: 221 10*3/uL (ref 150–400)
RBC: 4.44 MIL/uL (ref 4.22–5.81)
RDW: 13.4 % (ref 11.5–15.5)
WBC: 5.9 10*3/uL (ref 4.0–10.5)
nRBC: 0 % (ref 0.0–0.2)

## 2021-01-19 LAB — TROPONIN I (HIGH SENSITIVITY)
Troponin I (High Sensitivity): 12 ng/L (ref <18)
Troponin I (High Sensitivity): 15 ng/L (ref <18)

## 2021-01-19 NOTE — ED Triage Notes (Signed)
Pt here by [facility] with c/o chest pain radiating to left arm. Pt took 324 ASA . Refused nitro d/t headache side effect.  8/10 pain. Pt states cardiology informed him stents are occluded.

## 2021-01-19 NOTE — ED Provider Notes (Signed)
Emergency Medicine Provider Triage Evaluation Note  Benjamin Espinoza , a 55 y.o. male  was evaluated in triage.  Pt complains of CP.  Patient reports chest pain started last night and radiates to his left arm.  Was alleviated with oxygen in the ambulance ride, no nausea or vomiting.  Reports MI 2 months ago, cardiac cath in October. Feels like that.   Review of Systems  Positive: cp Negative: Nausea  Physical Exam  BP 115/78   Pulse (!) 58   Temp 98.5 F (36.9 C)   Resp 16   SpO2 100%  Gen:   Awake, no distress   Resp:  Normal effort  MSK:   Moves extremities without difficulty  Other:    Medical Decision Making  Medically screening exam initiated at 1:20 PM.  Appropriate orders placed.  Benjamin Espinoza was informed that the remainder of the evaluation will be completed by another provider, this initial triage assessment does not replace that evaluation, and the importance of remaining in the ED until their evaluation is complete.  Chest pani workup. Priority    Theron Arista, PA-C 01/19/21 1322    Mancel Bale, MD 01/19/21 1736

## 2021-03-05 ENCOUNTER — Other Ambulatory Visit: Payer: Self-pay | Admitting: Medical

## 2021-03-05 ENCOUNTER — Other Ambulatory Visit: Payer: Self-pay | Admitting: Cardiology

## 2021-03-05 ENCOUNTER — Other Ambulatory Visit: Payer: Self-pay

## 2021-03-05 MED ORDER — CLOPIDOGREL BISULFATE 75 MG PO TABS: 75.0000 mg | ORAL_TABLET | Freq: Every day | ORAL | 0 refills | Status: DC

## 2021-03-05 NOTE — Telephone Encounter (Signed)
This is a [place] pt °

## 2021-03-06 NOTE — Telephone Encounter (Signed)
Refilled until next appt.

## 2021-03-12 NOTE — Unmapped (Signed)
Specialty Medication(s): Repatha    Glen Gross has been dis-enrolled from the [facility] specialty pharmacy services due to multiple unsuccessful outreach attempts by the pharmacy.    Additional information provided to the patient: n/a    Glen Gross  [facility] Specialty Pharmacist

## 2021-03-18 ENCOUNTER — Ambulatory Visit: Payer: Medicare HMO | Admitting: Cardiology

## 2021-04-01 ENCOUNTER — Other Ambulatory Visit: Payer: Self-pay | Admitting: Cardiology

## 2021-04-02 ENCOUNTER — Institutional Professional Consult (permissible substitution): Payer: Commercial Managed Care - HMO | Admitting: Pulmonary Disease

## 2021-04-02 NOTE — Progress Notes (Deleted)
Synopsis: Referred in January 2023 for asthma by Berniece Salines, DO  Subjective:   PATIENT ID: Benjamin Espinoza: male DOB: 02/18/1966, MRN: [id number]   HPI  No chief complaint on file.  Benjamin Espinoza is a 56 year old male, daily smoker with history of coronary artery disease s/p CABG and PVD who is referred to pulmonary clinic for asthma.   He was seen in cardiology clinic on 12/17/20 with continued complaints of shortness of breath and trouble quitting smoking which prompted referral to pulmonary clinic.   Hx of asthma? Inhalers?    Past Medical History:  Diagnosis Date   Abnormal stress test    Anxiety    Asthma    as a child   Atypical chest pain 05/07/2017   Coronary artery disease    DDD (degenerative disc disease), lumbar 11/08/2019   Formatting of this note might be different from the original. [place]   Essential hypertension    Hx of CABG x4 01/14/2016 [place]   Hyperlipidemia    Myocardial infarction [facility]) 2017   PVD (peripheral vascular disease) ([place]) 09/21/2019   Shortness of breath    Tobacco use disorder      Family History  Problem Relation Age of Onset   Heart disease Mother    Heart disease Father    Heart disease Sister      Social History   Socioeconomic History   Marital status: Married    Spouse name: Not on file   Number of children: Not on file   Years of education: Not on file   Highest education level: Not on file  Occupational History   Not on file  Tobacco Use   Smoking status: Every Day    Years: 25.00    Types: Cigars, Cigarettes   Smokeless tobacco: Never   Tobacco comments:    as of 12/20/19 2 cigararettes/day  Vaping Use   Vaping Use: Never used  Substance and Sexual Activity   Alcohol use: No   Drug use: No   Sexual activity: Not on file  Other Topics Concern   Not on file  Social History Narrative   Not on file   Social Determinants of Health   Financial Resource Strain: Not on file  Food  Insecurity: Not on file  Transportation Needs: Not on file  Physical Activity: Not on file  Stress: Not on file  Social Connections: Not on file  Intimate Partner Violence: Not on file     Allergies  Allergen Reactions   Imdur [Isosorbide Nitrate] Other (See Comments)    Severe headache     Outpatient Medications Prior to Visit  Medication Sig Dispense Refill   amLODipine (NORVASC) 5 MG tablet Take 1 tablet (5 mg total) by mouth daily. 30 tablet 6   aspirin EC 81 MG tablet Take 2 tablets (162 mg total) by mouth daily. 30 tablet 0   atorvastatin (LIPITOR) 80 MG tablet Take 1 tablet (80 mg total) by mouth daily. 30 tablet 6   clopidogrel (PLAVIX) 75 MG tablet Take 1 tablet (75 mg total) by mouth daily. Keep your appt on 1/18 with Dr. Harriet Masson 14 tablet 0   gabapentin (NEURONTIN) 600 MG tablet Take 600 mg by mouth 2 (two) times daily as needed (Pain).      losartan (COZAAR) 25 MG tablet Take 1 tablet by mouth daily.     losartan (COZAAR) 25 MG tablet TAKE 1 TABLET(25 MG) BY MOUTH DAILY 90 tablet 0  metoprolol succinate (TOPROL XL) 25 MG 24 hr tablet Take 1 tablet (25 mg total) by mouth daily. 90 tablet 3   oxyCODONE-acetaminophen (PERCOCET/ROXICET) 5-325 MG tablet Take 1 tablet by mouth every 4 (four) hours as needed for severe pain. 25 tablet 0   pantoprazole (PROTONIX) 40 MG tablet Take 1 tablet (40 mg total) by mouth daily. 30 tablet 11   ranolazine (RANEXA) 1000 MG SR tablet Take 1 tablet (1,000 mg total) by mouth 2 (two) times daily. 90 tablet 3   REPATHA SURECLICK XX123456 MG/ML SOAJ Inject the contents of 1 pen (140 mg) under the skin every fourteen (14) days. (Patient taking differently: Inject 1 pen into the skin every 14 (fourteen) days.) 6 mL 3   Facility-Administered Medications Prior to Visit  Medication Dose Route Frequency Provider Last Rate Last Admin   sodium chloride flush (NS) 0.9 % injection 3 mL  3 mL Intravenous Q12H Tobb, Kardie, DO        Review of Systems   Constitutional:  Negative for chills, fever, malaise/fatigue and weight loss.  HENT:  Negative for congestion, sinus pain and sore throat.   Eyes: Negative.   Respiratory:  Negative for cough, hemoptysis, sputum production, shortness of breath and wheezing.   Cardiovascular:  Negative for chest pain, palpitations, orthopnea, claudication and leg swelling.  Gastrointestinal:  Negative for abdominal pain, heartburn, nausea and vomiting.  Genitourinary: Negative.   Musculoskeletal:  Negative for joint pain and myalgias.  Skin:  Negative for rash.  Neurological:  Negative for weakness.  Endo/Heme/Allergies: Negative.   Psychiatric/Behavioral: Negative.     Objective:  There were no vitals filed for this visit.  Physical Exam Constitutional:      General: He is not in acute distress. HENT:     Head: Normocephalic and atraumatic.  Eyes:     Extraocular Movements: Extraocular movements intact.     Conjunctiva/sclera: Conjunctivae normal.     Pupils: Pupils are equal, round, and reactive to light.  Cardiovascular:     Rate and Rhythm: Normal rate and regular rhythm.     Pulses: Normal pulses.     Heart sounds: Normal heart sounds. No murmur heard. Abdominal:     General: Bowel sounds are normal.     Palpations: Abdomen is soft.  Musculoskeletal:     Right lower leg: No edema.     Left lower leg: No edema.  Lymphadenopathy:     Cervical: No cervical adenopathy.  Skin:    General: Skin is warm and dry.  Neurological:     General: No focal deficit present.     Mental Status: He is alert.  Psychiatric:        Mood and Affect: Mood normal.        Behavior: Behavior normal.        Thought Content: Thought content normal.        Judgment: Judgment normal.   CBC    Component Value Date/Time   WBC 5.9 01/19/2021 1321   RBC 4.44 01/19/2021 1321   HGB 13.7 01/19/2021 1321   HGB 15.3 11/13/2019 1359   HCT 42.2 01/19/2021 1321   HCT 44.5 11/13/2019 1359   PLT 221 01/19/2021 1321    PLT 267 11/13/2019 1359   MCV 95.0 01/19/2021 1321   MCV 91 11/13/2019 1359   MCH 30.9 01/19/2021 1321   MCHC 32.5 01/19/2021 1321   RDW 13.4 01/19/2021 1321   RDW 13.6 11/13/2019 1359   BMP Latest Ref Rng & Units  01/19/2021 11/30/2020 11/29/2020  Glucose 70 - 99 mg/dL 97 91 81  BUN 6 - 20 mg/dL 11 7 14   Creatinine 0.61 - 1.24 mg/dL 1.20 1.21 1.30(H)  BUN/Creat Ratio 9 - 20 - - -  Sodium 135 - 145 mmol/L 138 138 137  Potassium 3.5 - 5.1 mmol/L 4.5 3.9 3.7  Chloride 98 - 111 mmol/L 107 105 109  CO2 22 - 32 mmol/L 26 25 23   Calcium 8.9 - 10.3 mg/dL 9.8 9.4 9.0   Chest imaging: CXR 01/19/21 The heart size and mediastinal contours are within normal limits. Both lungs are clear. There is evidence of previous coronary bypass surgery. There are no signs of pulmonary edema or focal pulmonary consolidation.  PFT: No flowsheet data found.  Labs:  Path:  Echo 10/22: LV EF 50-55%. RV size is mildly enlarged with normal RV function. LA mild to moderately dilated. RA normal size.   Heart Catheterization 12/01/20:   Ost LAD to Mid LAD lesion is 100% stenosed.   Prox RCA lesion is 90% stenosed.   Prox RCA to Dist RCA lesion is 100% stenosed.   Dist LAD-1 lesion is 100% stenosed.   Dist LAD-2 lesion is 85% stenosed.   Prox Cx to Mid Cx lesion is 100% stenosed.   1st Mrg-1 lesion is 95% stenosed.   1st Mrg-2 lesion is 95% stenosed.   Origin to Prox Graft lesion is 40% stenosed.   1st Mrg-3 lesion is 95% stenosed.   1st Mrg-4 lesion is 90% stenosed.   Ost LM to Mid LM lesion is 50% stenosed.   Prox Graft lesion is 20% stenosed.   LV end diastolic pressure is normal.  RECOMMENDATION: Angiograms will be reviewed with colleagues.  Compared to his September 2021 study, the vein graft supplying the RCA which was stented ostially and distally is now totally occluded.  There is significant progression of OM1 stenoses beyond the SVG anastomosis and the vessel is very small caliber.  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  Most likely will require increased medical regimen but will review with CTO colleagues regarding potential attempt at LAD CTO intervention.  Assessment & Plan:   Shortness of breath  Discussion: ***    Current Outpatient Medications:    amLODipine (NORVASC) 5 MG tablet, Take 1 tablet (5 mg total) by mouth daily., Disp: 30 tablet, Rfl: 6   aspirin EC 81 MG tablet, Take 2 tablets (162 mg total) by mouth daily., Disp: 30 tablet, Rfl: 0   atorvastatin (LIPITOR) 80 MG tablet, Take 1 tablet (80 mg total) by mouth daily., Disp: 30 tablet, Rfl: 6   clopidogrel (PLAVIX) 75 MG tablet, Take 1 tablet (75 mg total) by mouth daily. Keep your appt on 1/18 with Dr. Harriet Masson, Disp: 14 tablet, Rfl: 0   gabapentin (NEURONTIN) 600 MG tablet, Take 600 mg by mouth 2 (two) times daily as needed (Pain). , Disp: , Rfl:    losartan (COZAAR) 25 MG tablet, Take 1 tablet by mouth daily., Disp: , Rfl:    losartan (COZAAR) 25 MG tablet, TAKE 1 TABLET(25 MG) BY MOUTH DAILY, Disp: 90 tablet, Rfl: 0   metoprolol succinate (TOPROL XL) 25 MG 24 hr tablet, Take 1 tablet (25 mg total) by mouth daily., Disp: 90 tablet, Rfl: 3   oxyCODONE-acetaminophen (PERCOCET/ROXICET) 5-325 MG tablet, Take 1 tablet by mouth every 4 (four) hours as needed for severe pain., Disp: 25 tablet, Rfl: 0   pantoprazole (  PROTONIX) 40 MG tablet, Take 1 tablet (40 mg total) by mouth daily., Disp: 30 tablet, Rfl: 11   ranolazine (RANEXA) 1000 MG SR tablet, Take 1 tablet (1,000 mg total) by mouth 2 (two) times daily., Disp: 90 tablet, Rfl: 3   REPATHA SURECLICK XX123456 MG/ML SOAJ, Inject the contents of 1 pen (140 mg) under the skin every fourteen (14) days. (Patient taking differently: Inject 1 pen into the skin every 14 (fourteen) days.), Disp: 6 mL, Rfl: 3  Current Facility-Administered Medications:    sodium chloride flush (NS) 0.9 % injection 3 mL, 3 mL, Intravenous, Q12H,  Tobb, Kardie, DO

## 2021-04-06 ENCOUNTER — Telehealth: Payer: Self-pay

## 2021-04-06 NOTE — Telephone Encounter (Signed)
Called pt to let him know we are not able to refill his medication at this time due to him missing his previous appt. It was emphasized he needs to keep his appt to get further refills. He verbalized understanding. He has an appt Thursday at 8:20am. Pt states "I will make that appt with my insurance today."

## 2021-04-09 ENCOUNTER — Encounter: Payer: Self-pay | Admitting: Cardiology

## 2021-04-09 ENCOUNTER — Ambulatory Visit (INDEPENDENT_AMBULATORY_CARE_PROVIDER_SITE_OTHER): Payer: Medicare Other | Admitting: Cardiology

## 2021-04-09 ENCOUNTER — Other Ambulatory Visit: Payer: Self-pay

## 2021-04-09 VITALS — BP 102/76 | HR 73 | Ht 73.0 in | Wt 191.4 lb

## 2021-04-09 DIAGNOSIS — E782 Mixed hyperlipidemia: Secondary | ICD-10-CM | POA: Diagnosis not present

## 2021-04-09 DIAGNOSIS — I10 Essential (primary) hypertension: Secondary | ICD-10-CM

## 2021-04-09 DIAGNOSIS — I255 Ischemic cardiomyopathy: Secondary | ICD-10-CM

## 2021-04-09 DIAGNOSIS — Z79899 Other long term (current) drug therapy: Secondary | ICD-10-CM | POA: Diagnosis not present

## 2021-04-09 DIAGNOSIS — I739 Peripheral vascular disease, unspecified: Secondary | ICD-10-CM | POA: Diagnosis not present

## 2021-04-09 DIAGNOSIS — R0602 Shortness of breath: Secondary | ICD-10-CM

## 2021-04-09 DIAGNOSIS — Z951 Presence of aortocoronary bypass graft: Secondary | ICD-10-CM

## 2021-04-09 DIAGNOSIS — F17200 Nicotine dependence, unspecified, uncomplicated: Secondary | ICD-10-CM

## 2021-04-09 LAB — CBC WITH DIFFERENTIAL/PLATELET
Basophils Absolute: 0.1 10*3/uL (ref 0.0–0.2)
Basos: 1 %
EOS (ABSOLUTE): 0.1 10*3/uL (ref 0.0–0.4)
Eos: 2 %
Hematocrit: 41.6 % (ref 37.5–51.0)
Hemoglobin: 14.2 g/dL (ref 13.0–17.7)
Immature Grans (Abs): 0 10*3/uL (ref 0.0–0.1)
Immature Granulocytes: 0 %
Lymphocytes Absolute: 2.1 10*3/uL (ref 0.7–3.1)
Lymphs: 31 %
MCH: 31.4 pg (ref 26.6–33.0)
MCHC: 34.1 g/dL (ref 31.5–35.7)
MCV: 92 fL (ref 79–97)
Monocytes Absolute: 0.6 10*3/uL (ref 0.1–0.9)
Monocytes: 9 %
Neutrophils Absolute: 3.9 10*3/uL (ref 1.4–7.0)
Neutrophils: 57 %
Platelets: 231 10*3/uL (ref 150–450)
RBC: 4.52 x10E6/uL (ref 4.14–5.80)
RDW: 13.1 % (ref 11.6–15.4)
WBC: 6.8 10*3/uL (ref 3.4–10.8)

## 2021-04-09 LAB — BASIC METABOLIC PANEL
BUN/Creatinine Ratio: 10 (ref 9–20)
BUN: 13 mg/dL (ref 6–24)
CO2: 25 mmol/L (ref 20–29)
Calcium: 10.7 mg/dL — ABNORMAL HIGH (ref 8.7–10.2)
Chloride: 101 mmol/L (ref 96–106)
Creatinine, Ser: 1.25 mg/dL (ref 0.76–1.27)
Glucose: 101 mg/dL — ABNORMAL HIGH (ref 70–99)
Potassium: 4 mmol/L (ref 3.5–5.2)
Sodium: 141 mmol/L (ref 134–144)
eGFR: 68 mL/min/{1.73_m2} (ref >59)

## 2021-04-09 LAB — MAGNESIUM: Magnesium: 2 mg/dL (ref 1.6–2.3)

## 2021-04-09 MED ORDER — ATORVASTATIN CALCIUM 80 MG PO TABS: 80.0000 mg | ORAL_TABLET | Freq: Every day | ORAL | 3 refills | Status: DC

## 2021-04-09 MED ORDER — AMLODIPINE BESYLATE 5 MG PO TABS: 5.0000 mg | ORAL_TABLET | Freq: Every day | ORAL | 3 refills | Status: DC

## 2021-04-09 MED ORDER — CLOPIDOGREL BISULFATE 75 MG PO TABS: 75.0000 mg | ORAL_TABLET | Freq: Every day | ORAL | 3 refills | Status: DC

## 2021-04-09 MED ORDER — RANOLAZINE ER 1000 MG PO TB12: 1000.0000 mg | ORAL_TABLET | Freq: Two times a day (BID) | ORAL | 3 refills | Status: DC

## 2021-04-09 MED ORDER — LOSARTAN POTASSIUM 25 MG PO TABS: ORAL_TABLET | ORAL | 3 refills | Status: DC

## 2021-04-09 MED ORDER — METOPROLOL SUCCINATE ER 25 MG PO TB24: 25.0000 mg | ORAL_TABLET | Freq: Every day | ORAL | 3 refills | Status: DC

## 2021-04-09 NOTE — Patient Instructions (Signed)
Medication Instructions:  Your physician recommends that you continue on your current medications as directed. Please refer to the Current Medication list given to you today.  Refilled all cardiac medications *If you need a refill on your cardiac medications before your next appointment, please call your pharmacy*   Lab Work: Your physician recommends that you return for lab work in:  TODAY: BMET, CBC, Mag If you have labs (blood work) drawn today and your tests are completely normal, you will receive your results only by: MyChart Message (if you have MyChart) OR A paper copy in the mail If you have any lab test that is abnormal or we need to change your treatment, we will call you to review the results.   Testing/Procedures: None   Follow-Up: At [facility], you and your health needs are our priority.  As part of our continuing mission to provide you with exceptional heart care, we have created designated Provider Care Teams.  These Care Teams include your primary Cardiologist (physician) and Advanced Practice Providers (APPs -  Physician Assistants and Nurse Practitioners) who all work together to provide you with the care you need, when you need it.  We recommend signing up for the patient portal called "MyChart".  Sign up information is provided on this After Visit Summary.  MyChart is used to connect with patients for Virtual Visits (Telemedicine).  Patients are able to view lab/test results, encounter notes, upcoming appointments, etc.  Non-urgent messages can be sent to your provider as well.   To learn more about what you can do with MyChart, go to [email or web address].    Your next appointment:   6 month(s)  The format for your next appointment:   In Person  Provider:   Thomasene Ripple, DO     Other Instructions

## 2021-04-09 NOTE — Progress Notes (Signed)
Cardiology Office Note:    Date:  04/09/2021   ID:  Benjamin Espinoza, DOB 1965/07/11, MRN [id number]  PCP:  Teena Irani, PA-C  Cardiologist:  Thomasene Ripple, DO  Electrophysiologist:  None   Referring MD: Teena Irani, PA-C   " I am having shortness of breath"  History of Present Illness:    Benjamin Espinoza is a 56 y.o. male with a hx of coronary artery disease status post CABG in 2017 in [place], on April 24, 2019 the patient did have unstable angina was transferred to [facility] where he underwent a left heart catheterization showing all his grafts were patent, he recently underwent a work-up with nuclear stress test which was abnormal he was sent for cardiac catheterization which he did on November 26, 2019 at that time he was found to have 70% ostial lesion in his SVG to PDA and this was treated with a resolute Onyx stent,  on 12/01/2020 he underwent a LHC which now showed that his SVG which was stented in 10/2019 is now occluded, hypertension, hyperlipidemia, prediabetes, tobacco use and degenerative disc disease.   I saw the patient in November 29, 2019 at that time I recommended that he follow-up in 3 months unfortunately he did not.      In march 2022, he was seen at the emergency department at [facility] where he presented on May 24, 2020 at that time he complained of abdominal pain.  He had a chest x-ray as well as an abdominal x-ray which was reported to be normal - he was discharged home.    I saw the patient on December 17, 2020 at that time he was posthospitalization at that time he underwent a left heart catheterization which showed new disease with his SVG stents being occluded.  Medical management was advised.  During the visit the patient admitted that he had not been taking all of his medications as prescribed as well as he will still continue to smoke.  I counseled the patient at that time noting that he needed to really be compliant with his medication.   He was referred to cardiac rehab.  I also referred the patient to pulmonary at that time given the fact that his COPD was untreated and he was complaining of shortness of breath.  Unfortunately he has not been able to follow through with this.  We referred him to our smoking cessation program but he also did not follow through with this.  In the meantime I discussed with our CTO team and the recommendation was medical management for now.  His anginal symptoms is chest pain and he is not experiencing any chest pain.  He is experiencing significant shortness of breath and exertion.    Past Medical History:  Diagnosis Date   Abnormal stress test    Anxiety    Asthma    as a child   Atypical chest pain 05/07/2017   Coronary artery disease    DDD (degenerative disc disease), lumbar 11/08/2019   Formatting of this note might be different from the original. 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 [place]   Hyperlipidemia    Myocardial infarction [facility]) 2017   PVD (peripheral vascular disease) (HCC) 09/21/2019   Shortness of breath    Tobacco use disorder     Past Surgical History:  Procedure Laterality Date   ABDOMINAL AORTOGRAM W/LOWER EXTREMITY Bilateral 09/21/2019   Procedure: ABDOMINAL AORTOGRAM W/LOWER EXTREMITY;  Surgeon: [name], MD;  Location: [facility] INVASIVE CV LAB;  Service: Cardiovascular;  Laterality: Bilateral;   ABDOMINAL AORTOGRAM W/LOWER EXTREMITY N/A 12/07/2019   Procedure: ABDOMINAL AORTOGRAM W/LOWER EXTREMITY;  Surgeon: [name], MD;  Location: [facility] INVASIVE CV LAB;  Service: Cardiovascular;  Laterality: N/A;   CARDIAC CATHETERIZATION     CORONARY ARTERY BYPASS GRAFT     quadruple bypas 2017   CORONARY STENT INTERVENTION N/A 11/26/2019   Procedure: CORONARY STENT INTERVENTION;  Surgeon: Yvonne Kendall, MD;  Location: [facility] INVASIVE CV LAB;  Service: Cardiovascular;  Laterality: N/A;   FEMORAL-POPLITEAL BYPASS GRAFT Right  12/25/2019   Procedure: BYPASS GRAFT FEMORAL- BELOW KNEE POPLITEAL ARTERY RIGHT USING GORE PROPATEN VASCULAR GRAFT;  Surgeon: [name], MD;  Location: [facility] OR;  Service: Vascular;  Laterality: Right;   LEFT HEART CATH AND CORS/GRAFTS ANGIOGRAPHY N/A 11/26/2019   Procedure: LEFT HEART CATH AND CORS/GRAFTS ANGIOGRAPHY;  Surgeon: Yvonne Kendall, MD;  Location: [facility] INVASIVE CV LAB;  Service: Cardiovascular;  Laterality: N/A;   LEFT HEART CATH AND CORS/GRAFTS ANGIOGRAPHY N/A 12/01/2020   Procedure: LEFT HEART CATH AND CORS/GRAFTS ANGIOGRAPHY;  Surgeon: Lennette Bihari, MD;  Location: [facility] INVASIVE CV LAB;  Service: Cardiovascular;  Laterality: N/A;   LOWER EXTREMITY ANGIOGRAM Right 12/25/2019   Procedure: RIGHT LOWER EXTREMITY ANGIOGRAM;  Surgeon: [name], MD;  Location: [facility] OR;  Service: Vascular;  Laterality: Right;   PERIPHERAL VASCULAR BALLOON ANGIOPLASTY Left 12/07/2019   Procedure: PERIPHERAL VASCULAR BALLOON ANGIOPLASTY;  Surgeon: [name], MD;  Location: [facility] INVASIVE CV LAB;  Service: Cardiovascular;  Laterality: Left;  SFA    Current Medications: Current Meds  Medication Sig   aspirin EC 81 MG tablet Take 2 tablets (162 mg total) by mouth daily.   gabapentin (NEURONTIN) 600 MG tablet Take 600 mg by mouth 2 (two) times daily as needed (Pain).    oxyCODONE-acetaminophen (PERCOCET/ROXICET) 5-325 MG tablet Take 1 tablet by mouth every 4 (four) hours as needed for severe pain.   [DISCONTINUED] amLODipine (NORVASC) 5 MG tablet Take 1 tablet (5 mg total) by mouth daily.   [DISCONTINUED] atorvastatin (LIPITOR) 80 MG tablet Take 1 tablet (80 mg total) by mouth daily.   [DISCONTINUED] clopidogrel (PLAVIX) 75 MG tablet Take 1 tablet (75 mg total) by mouth daily. Keep your appt on 1/18 with Dr. Servando Salina   [DISCONTINUED] losartan (COZAAR) 25 MG tablet TAKE 1 TABLET(25 MG) BY MOUTH DAILY   [DISCONTINUED] metoprolol succinate (TOPROL XL) 25 MG 24 hr tablet Take 1 tablet  (25 mg total) by mouth daily.   [DISCONTINUED] ranolazine (RANEXA) 1000 MG SR tablet Take 1 tablet (1,000 mg total) by mouth 2 (two) times daily.   Current Facility-Administered Medications for the 04/09/21 encounter (Office Visit) with Thomasene Ripple, DO  Medication   sodium chloride flush (NS) 0.9 % injection 3 mL     Allergies:   Imdur [isosorbide nitrate]   Social History   Socioeconomic History   Marital status: Married    Spouse name: Not on file   Number of children: Not on file   Years of education: Not on file   Highest education level: Not on file  Occupational History   Not on file  Tobacco Use   Smoking status: Every Day    Years: 25.00    Types: Cigars, Cigarettes   Smokeless tobacco: Never   Tobacco comments:    as of 12/20/19 2 cigararettes/day  Vaping Use   Vaping Use: Never used  Substance and Sexual Activity   Alcohol use: No   Drug use: No   Sexual activity: Not on file  Other Topics Concern   Not on file  Social History Narrative   Not on file   Social Determinants of Health   Financial Resource Strain: Not on file  Food Insecurity: Not on file  Transportation Needs: Not on file  Physical Activity: Not on file  Stress: Not on file  Social Connections: Not on file     Family History: The patient's family history includes Heart disease in his father, mother, and sister.  ROS:   Review of Systems  Constitution: Negative for decreased appetite, fever and weight gain.  HENT: Negative for congestion, ear discharge, hoarse voice and sore throat.   Eyes: Negative for discharge, redness, vision loss in right eye and visual halos.  Cardiovascular: Negative for chest pain, dyspnea on exertion, leg swelling, orthopnea and palpitations.  Respiratory: Negative for cough, hemoptysis, shortness of breath and snoring.   Endocrine: Negative for heat intolerance and polyphagia.  Hematologic/Lymphatic: Negative for bleeding problem. Does not bruise/bleed easily.   Skin: Negative for flushing, nail changes, rash and suspicious lesions.  Musculoskeletal: Negative for arthritis, joint pain, muscle cramps, myalgias, neck pain and stiffness.  Gastrointestinal: Negative for abdominal pain, bowel incontinence, diarrhea and excessive appetite.  Genitourinary: Negative for decreased libido, genital sores and incomplete emptying.  Neurological: Negative for brief paralysis, focal weakness, headaches and loss of balance.  Psychiatric/Behavioral: Negative for altered mental status, depression and suicidal ideas.  Allergic/Immunologic: Negative for HIV exposure and persistent infections.    EKGs/Labs/Other Studies Reviewed:    The following studies were reviewed today:   EKG: None today  LHC 12/01/2020   Ost LAD to Mid LAD lesion is 100% stenosed.   Prox RCA lesion is 90% stenosed.   Prox RCA to Dist RCA lesion is 100% stenosed.   Dist LAD-1 lesion is 100% stenosed.   Dist LAD-2 lesion is 85% stenosed.   Prox Cx to Mid Cx lesion is 100% stenosed.   1st Mrg-1 lesion is 95% stenosed.   1st Mrg-2 lesion is 95% stenosed.   Origin to Prox Graft lesion is 40% stenosed.   1st Mrg-3 lesion is 95% stenosed.   1st Mrg-4 lesion is 90% stenosed.   Ost LM to Mid LM lesion is 50% stenosed.   Prox Graft lesion is 20% stenosed.   LV end diastolic pressure is normal.   Severe multivessel native coronary artery disease with 50% diffuse left main stenosis, total LAD occlusion at the ostium, high-grade segmental stenoses in the OM1 vessel proximal to and distal to the SVG anastomosis with total occlusion of the AV groove circumflex after the OM1 takeoff and 90% proximal and total occlusion of the proximal to mid native RCA with evidence for left to right distal collateralization via the left coronary system.   Patent LIMA to LAD with previously noted chronic total occlusion of the LAD moderate segment beyond the anastomosis with small caliber distal LAD vessel after the CTO  which is mildly collateralized.   Patent SVG to the left circumflex OM 1 vessel with 40% eccentric proximal graft stenosis.  The OM vessel immediately after the anastomosis is 95% stenosed followed by diffuse 90% stenosis and is a very small caliber distal vessel.   Patent SVG supplying the OM 2 vessel with mild 20% narrowing in the proximal third of the graft.  The AV groove left circumflex fills retrograde to the proximal total  occlusion arising distal to the OM1 takeoff.   Occluded saphenous vein graft which was previously stented in September 2021 with total occlusion of the ostial SVG stent with TIMI 0 flow.   Follow moderate LV dysfunction with EF estimated 40% with mid-distal inferior significant hypocontractility.  LVEDP 15 mmHg.   RECOMMENDATION: Angiograms will be reviewed with colleagues.  Compared to his September 2021 study, the vein graft supplying the RCA which was stented ostially and distally is now totally occluded.  There is significant progression of OM1 stenoses beyond the SVG anastomosis and the vessel is very small caliber.  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  Most likely will require increased medical regimen but will review with CTO colleagues regarding potential attempt at LAD CTO intervention.  Recent Labs: 11/29/2020: TSH 1.909 01/19/2021: BUN 11; Creatinine, Ser 1.20; Hemoglobin 13.7; Platelets 221; Potassium 4.5; Sodium 138  Recent Lipid Panel    Component Value Date/Time   CHOL 210 (H) 11/29/2020 0204   TRIG 141 11/29/2020 0204   HDL 40 (L) 11/29/2020 0204   CHOLHDL 5.3 11/29/2020 0204   VLDL 28 11/29/2020 0204   LDLCALC 142 (H) 11/29/2020 0204    Physical Exam:    VS:  BP 102/76    Pulse 73    Ht  (1.854 m)    Wt 191 lb 6.4 oz (86.8 kg)    SpO2 96%    BMI 25.25 kg/m     Wt Readings from Last 3 Encounters:  04/09/21 191 lb 6.4 oz (86.8 kg)  12/17/20 200 lb 3.2 oz (90.8 kg)  11/30/20 200 lb 6.4 oz  (90.9 kg)     GEN: Well nourished, well developed in no acute distress HEENT: Normal NECK: No JVD; No carotid bruits LYMPHATICS: No lymphadenopathy CARDIAC: S1S2 noted,RRR, no murmurs, rubs, gallops RESPIRATORY:  Clear to auscultation without rales, wheezing or rhonchi  ABDOMEN: Soft, non-tender, non-distended, +bowel sounds, no guarding. EXTREMITIES: No edema, No cyanosis, no clubbing MUSCULOSKELETAL:  No deformity  SKIN: Warm and dry NEUROLOGIC:  Alert and oriented x 3, non-focal PSYCHIATRIC:  Normal affect, good insight  ASSESSMENT:    1. Shortness of breath   2. Mixed hyperlipidemia   3. Medication management   4. PVD (peripheral vascular disease) (HCC)   5. Essential hypertension   6. Ischemic cardiomyopathy   7. Hx of CABG   8. Tobacco use disorder    PLAN:    He is short of breath which I do think that it is in the setting of his untreated COPD.  Unfortunately his last referral to pulmonary he did not make it.  Were going to refer him again to pulmonary.  In terms of his coronary artery disease we had a long discussion, he is not compliant with all of his medication.  He had not been taking the Plavix.  He is going to get back on the aspirin and Plavix.  Going to send refills as well.  His atorvastatin 80 mg he is taking, but his last LDL was greater than 70.  He ideally should be less than 55.  He has stopped the Repatha.  I advised the patient that he needed to get back on the Repatha given the fact that he has significant coronary artery disease and his regimen currently with just the statin is not helping.  He prefers not to be on Zetia.  I will refer him back to our pharmacy team.  He may  benefit from cardiac rehab.  I will refer the patient again.  Blood pressure is acceptable, continue with current antihypertensive regimen.  He is not ready to quit smoking.  He has cut back.  But I explained to the patient that ideally it would be best if he was able to quit  smoking.  We referred him last time to smoking cessation program.  Before this time he will wants to wait.    The patient is in agreement with the above plan. The patient left the office in stable condition.  The patient will follow up in 6 months or sooner if needed.   Medication Adjustments/Labs and Tests Ordered: Current medicines are reviewed at length with the patient today.  Concerns regarding medicines are outlined above.  Orders Placed This Encounter  Procedures   Basic Metabolic Panel (BMET)   CBC with Differential/Platelet   Magnesium   AMB Referral to [facility] Lipid Disorders Clinic   AMB referral to cardiac rehabilitation   Ambulatory referral to Pulmonology   Meds ordered this encounter  Medications   atorvastatin (LIPITOR) 80 MG tablet    Sig: Take 1 tablet (80 mg total) by mouth daily.    Dispense:  90 tablet    Refill:  3   clopidogrel (PLAVIX) 75 MG tablet    Sig: Take 1 tablet (75 mg total) by mouth daily.    Dispense:  90 tablet    Refill:  3   losartan (COZAAR) 25 MG tablet    Sig: TAKE 1 TABLET(25 MG) BY MOUTH DAILY    Dispense:  90 tablet    Refill:  3   amLODipine (NORVASC) 5 MG tablet    Sig: Take 1 tablet (5 mg total) by mouth daily.    Dispense:  90 tablet    Refill:  3   metoprolol succinate (TOPROL XL) 25 MG 24 hr tablet    Sig: Take 1 tablet (25 mg total) by mouth daily.    Dispense:  90 tablet    Refill:  3   ranolazine (RANEXA) 1000 MG SR tablet    Sig: Take 1 tablet (1,000 mg total) by mouth 2 (two) times daily.    Dispense:  90 tablet    Refill:  3    Patient Instructions  Medication Instructions:  Your physician recommends that you continue on your current medications as directed. Please refer to the Current Medication list given to you today.  Refilled all cardiac medications *If you need a refill on your cardiac medications before your next appointment, please call your pharmacy*   Lab Work: Your physician recommends that you  return for lab work in:  TODAY: BMET, CBC, Mag If you have labs (blood work) drawn today and your tests are completely normal, you will receive your results only by: MyChart Message (if you have MyChart) OR A paper copy in the mail If you have any lab test that is abnormal or we need to change your treatment, we will call you to review the results.   Testing/Procedures: None   Follow-Up: At [facility], you and your health needs are our priority.  As part of our continuing mission to provide you with exceptional heart care, we have created designated Provider Care Teams.  These Care Teams include your primary Cardiologist (physician) and Advanced Practice Providers (APPs -  Physician Assistants and Nurse Practitioners) who all work together to provide you with the care you need, when you need it.  We recommend signing up for  the patient portal called "MyChart".  Sign up information is provided on this After Visit Summary.  MyChart is used to connect with patients for Virtual Visits (Telemedicine).  Patients are able to view lab/test results, encounter notes, upcoming appointments, etc.  Non-urgent messages can be sent to your provider as well.   To learn more about what you can do with MyChart, go to [email or web address].    Your next appointment:   6 month(s)  The format for your next appointment:   In Person  Provider:   Thomasene Ripple, DO     Other Instructions     Adopting a Healthy Lifestyle.  Know what a healthy weight is for you (roughly BMI <25) and aim to maintain this   Aim for 7+ servings of fruits and vegetables daily   65-80+ fluid ounces of water or unsweet tea for healthy kidneys   Limit to max 1 drink of alcohol per day; avoid smoking/tobacco   Limit animal fats in diet for cholesterol and heart health - choose grass fed whenever available   Avoid highly processed foods, and foods high in saturated/trans fats   Aim for low stress - take time to unwind  and care for your mental health   Aim for 150 min of moderate intensity exercise weekly for heart health, and weights twice weekly for bone health   Aim for 7-9 hours of sleep daily   When it comes to diets, agreement about the perfect plan isnt easy to find, even among the experts. Experts at the [facility] [employer] developed an idea known as the Healthy Eating Plate. Just imagine a plate divided into logical, healthy portions.   The emphasis is on diet quality:   Load up on vegetables and fruits - one-half of your plate: Aim for color and variety, and remember that potatoes dont count.   Go for whole grains - one-quarter of your plate: Whole wheat, barley, wheat berries, quinoa, oats, brown rice, and foods made with them. If you want pasta, go with whole wheat pasta.   Protein power - one-quarter of your plate: Fish, chicken, beans, and nuts are all healthy, versatile protein sources. Limit red meat.   The diet, however, does go beyond the plate, offering a few other suggestions.   Use healthy plant oils, such as olive, canola, soy, corn, sunflower and peanut. Check the labels, and avoid partially hydrogenated oil, which have unhealthy trans fats.   If youre thirsty, drink water. Coffee and tea are good in moderation, but skip sugary drinks and limit milk and dairy products to one or two daily servings.   The type of carbohydrate in the diet is more important than the amount. Some sources of carbohydrates, such as vegetables, fruits, whole grains, and beans-are healthier than others.   Finally, stay active  Signed, Thomasene Ripple, DO  04/09/2021 8:56 AM    [facility]

## 2021-04-28 ENCOUNTER — Telehealth: Payer: Self-pay | Admitting: Cardiology

## 2021-04-28 NOTE — Telephone Encounter (Signed)
New Message:    Shanda Bumps from [facility] called. She wanted  to know if Dr Servando Salina received the Medicaid form she faxed yesterday? She says she need this back asap please.

## 2021-04-28 NOTE — Telephone Encounter (Signed)
Fax sent. Called Cardiac rehab to let them know the paperwork was faxed and was successful on my end.

## 2021-04-29 ENCOUNTER — Ambulatory Visit: Payer: Commercial Managed Care - HMO | Admitting: Cardiology

## 2021-05-01 ENCOUNTER — Telehealth (HOSPITAL_COMMUNITY): Payer: Self-pay

## 2021-05-01 ENCOUNTER — Encounter (HOSPITAL_COMMUNITY): Payer: Self-pay

## 2021-05-01 NOTE — Telephone Encounter (Signed)
Attempted to call patient in regards to Cardiac Rehab - unable to leave VM.   Mailed letter 

## 2021-05-01 NOTE — Telephone Encounter (Signed)
Pt insurance is active and benefits verified through [facility] Medicare. Co-pay $0.00, DED $0.00/$0.00 met, out of pocket $8,300.00/$0.00 met, co-insurance 20%. No pre-authorization required. Passport, 05/01/21 @ 3PM, SNG#[id number] ?  ?2ndary insurance is active and benefits verified through Medicaid. Co-pay $0.00, DED $0.00/$0.00 met, out of pocket $0.00/$0.00 met, co-insurance 0%. No pre-authorization required. Passport, 05/01/21 @ 3:03PM, BOZ#[id number]  ?  ?Will contact patient to see if he is interested in the Cardiac Rehab Program. ?

## 2021-05-07 ENCOUNTER — Telehealth: Payer: Self-pay

## 2021-05-07 ENCOUNTER — Ambulatory Visit: Payer: Medicare Other

## 2021-05-07 DIAGNOSIS — E785 Hyperlipidemia, unspecified: Secondary | ICD-10-CM

## 2021-05-07 MED ORDER — REPATHA SURECLICK 140 MG/ML ~~LOC~~ SOAJ: SUBCUTANEOUS | 3 refills | Status: DC

## 2021-05-07 NOTE — Progress Notes (Deleted)
Patient ID: AYDRIEN KAHELE                 DOB: 01-15-1966                    MRN: [id number] ? ? ? ? ?HPI: ?Benjamin Espinoza is a 56 y.o. male patient referred to lipid clinic by Dr Harriet Masson. PMH is significant for  ? ?Current Medications:  ?Intolerances:  ?Risk Factors:  ?LDL goal:  ? ?Diet:  ? ?Exercise:  ? ?Family History:  ? ?Social History:  ? ?Labs: ? ?Past Medical History:  ?Diagnosis Date  ? Abnormal stress test   ? Anxiety   ? Asthma   ? as a child  ? Atypical chest pain 05/07/2017  ? Coronary artery disease   ? DDD (degenerative disc disease), lumbar 11/08/2019  ? Formatting of this note might be different from the original. [place]  ? Essential hypertension   ? Hx of CABG x4 01/14/2016 [place]  ? Hyperlipidemia   ? Myocardial infarction [facility]) 2017  ? PVD (peripheral vascular disease) ([place]) 09/21/2019  ? Shortness of breath   ? Tobacco use disorder   ? ? ?Current Outpatient Medications on File Prior to Visit  ?Medication Sig Dispense Refill  ? amLODipine (NORVASC) 5 MG tablet Take 1 tablet (5 mg total) by mouth daily. 90 tablet 3  ? aspirin EC 81 MG tablet Take 2 tablets (162 mg total) by mouth daily. 30 tablet 0  ? atorvastatin (LIPITOR) 80 MG tablet Take 1 tablet (80 mg total) by mouth daily. 90 tablet 3  ? clopidogrel (PLAVIX) 75 MG tablet Take 1 tablet (75 mg total) by mouth daily. 90 tablet 3  ? gabapentin (NEURONTIN) 600 MG tablet Take 600 mg by mouth 2 (two) times daily as needed (Pain).     ? losartan (COZAAR) 25 MG tablet TAKE 1 TABLET(25 MG) BY MOUTH DAILY 90 tablet 3  ? metoprolol succinate (TOPROL XL) 25 MG 24 hr tablet Take 1 tablet (25 mg total) by mouth daily. 90 tablet 3  ? oxyCODONE-acetaminophen (PERCOCET/ROXICET) 5-325 MG tablet Take 1 tablet by mouth every 4 (four) hours as needed for severe pain. 25 tablet 0  ? pantoprazole (PROTONIX) 40 MG tablet Take 1 tablet (40 mg total) by mouth daily. (Patient not taking: Reported on 04/09/2021) 30 tablet 11  ? ranolazine (RANEXA) 1000 MG SR  tablet Take 1 tablet (1,000 mg total) by mouth 2 (two) times daily. 90 tablet 3  ? REPATHA SURECLICK [id number] MG/ML SOAJ Inject the contents of 1 pen (140 mg) under the skin every fourteen (14) days. (Patient not taking: Reported on 04/09/2021) 6 mL 3  ? ?Current Facility-Administered Medications on File Prior to Visit  ?Medication Dose Route Frequency Provider Last Rate Last Admin  ? sodium chloride flush (NS) 0.9 % injection 3 mL  3 mL Intravenous Q12H Tobb, Kardie, DO      ? ? ?Allergies  ?Allergen Reactions  ? Imdur [Isosorbide Nitrate] Other (See Comments)  ?  Severe headache  ? ? ?Assessment/Plan: ? ?1. Hyperlipidemia -  ?  ?

## 2021-05-07 NOTE — Telephone Encounter (Signed)
Called and spoke w/pt as requested by Dr.Pavero RPH to determine what the issue was with repatha sureclick 140mg  q2w sq. The patient said his medication just wasn't at the right pharmacy was sent to [facility] when he needed it at . I took the liberty of doing a pa for repatha 140mg  q2w and informed the pt that we will send the medication to the correct pharmacy and to complete fasting labs post 4th dose. I also mentioned for him to call me if unaffordable. Pt voiced understanding. Lipid panel ordered via dr. BJ's verbal orders and released.  ?

## 2021-05-12 ENCOUNTER — Institutional Professional Consult (permissible substitution): Payer: Medicare Other | Admitting: Pulmonary Disease

## 2021-05-15 ENCOUNTER — Telehealth (HOSPITAL_COMMUNITY): Payer: Self-pay

## 2021-05-15 DIAGNOSIS — I25708 Atherosclerosis of coronary artery bypass graft(s), unspecified, with other forms of angina pectoris: Secondary | ICD-10-CM

## 2021-05-15 NOTE — Telephone Encounter (Signed)
Erroneous encounter

## 2021-05-15 NOTE — Telephone Encounter (Signed)
Patient returned call and left message to please call back. Second unsuccessful call to number provided in patient's VM to cardiac rehab.  ?

## 2021-05-15 NOTE — Telephone Encounter (Signed)
Patient returning call to cardiac rehab. Confirmed cardiac rehab orientation appointment for 05/21/21 at 10:00. Attempted to do health history when patient states "I will try to answer questions but I am having chest pains like indigestion when I went to the hospital and they told me it was not indigestion". Attempted to verbally assess patients s/s. Asked if he had SL nitro with response "I have all my medicines". Patient then tells RN to hurry up and do health history. RN encouraged patient to take nitro if available and if not relieved 5 min after second dose to call 911 and take final dose. Patient became angry and hung up phone. Patient may not be appropriate to begin cardiac rehab if continuing to have anginal pain. ?

## 2021-05-15 NOTE — Telephone Encounter (Signed)
Unsuccessful telephone encounter to patient to confirm cardiac rehab orientation appointment for 05/21/21 at 10:00 am and to complete brief health history. Unable to leave message at given number as voice mailbox has not been set up. Will attempt call back at later time.  ?

## 2021-05-19 ENCOUNTER — Encounter (HOSPITAL_COMMUNITY): Payer: Self-pay | Attending: Cardiology

## 2021-05-19 ENCOUNTER — Other Ambulatory Visit: Payer: Self-pay

## 2021-05-19 ENCOUNTER — Telehealth (HOSPITAL_COMMUNITY): Payer: Self-pay | Admitting: *Deleted

## 2021-05-19 NOTE — Telephone Encounter (Signed)
Called back as requested by the patient to complete his health issue and Cardiac risk profile. Call rang a couple of times then went to message that voicemail  has not been set up.  Called again straight to message that voicemail has not ben set. Alanson Aly, BSN ?Cardiac and Pulmonary Rehab Nurse Navigator  ? ?

## 2021-05-19 NOTE — Telephone Encounter (Signed)
Called to check on pt to see how he is doing. Pt stated that he was not to be called prior to 10:00.  I apologized and stated that I was unaware.  Asked if he would like for me to call him back after 10.  Pt stated he did not, he would go ahead and speak with me. Asked if how he was feeling - he spoke with [facility] staff last Friday and reported "chest pain".  Pt stated that he "did not take nitro because this felt more like heart burn than what he had experienced prior to the procedure".  No further issues or complaints since.   Wanted to complete pt medical history in preparation for orientation on Thursday. Pt asked for me to call him back after 10. Agreed to call him back.  Pt thanked me for checking on him. Alanson Aly, BSN ?Cardiac and Pulmonary Rehab Nurse Navigator  ? ?

## 2021-05-19 NOTE — Telephone Encounter (Addendum)
Conversation - chat message: ?Karlene Lineman RN ?"Good Day Dr. Barbaraann Share you are doing well.  Reaching out to you regarding the above pt who is scheduled for orientation this Thursday.  you will see that he reported CP and ended the conversation abruptly and attempts to call back went straight to voicemail.  I called and spoke with him this morning he reports that it wasn't chest pain but more like heartburn.  No repeat episodes.  Ok to proceed with Cardiac rehab as planned or does he need to be seen prior? sees primary on 4/13 and he does not have a cardiac follow up." ? ?Dr. Servando Salina response: ?"Is okay to move on with cardiac rehab.  I saw your message with him.  I am sorry about the experience you had sometimes he can be very abrupt and he has done that 2 with multiple people in the office." ? ?Khylee Algeo Carton RN response ?"Ok thanks so much for your support!!" ? ? ?Dr. Servando Salina response ?"you are welcome!" ? ? ? ?

## 2021-05-20 ENCOUNTER — Telehealth (HOSPITAL_COMMUNITY): Payer: Self-pay | Admitting: *Deleted

## 2021-05-20 NOTE — Telephone Encounter (Signed)
Chat message: ? ?Benjamin Espinoza Airen RN: "Its me again!  Just got off the phone with Les Pou.  With much probing and rephrasing questions  he reports having "burning" sensation when he starts to walk and interestingly the burning goes away while walking. Walks about 20 minutes daily.  Rates this about a 1-2 not as bad as before his PCI in October. Doesn't happen everytime.  Verbalizes compliance with all of his medications including anti-anginal agents.  Known CTO with medical management.  History of COPD has pulmonary referral - looks like they have reached out to him twice unable to leave message as the voicemail is not setup.  I do plan to provide him the phone number and ask that he call and set this appt. Any further action needed?  If not, what is an acceptable level of "burning anticipated with increase activity(stable angina) for [place] in cardiac rehab" ? ?Dr. Servando Salina: "Sure having him come to be evaluated will help- he does have chronic angina. That way I can make sure he's actually complaint with his meds as well." ? ?Laketa Sandoz Matas RN "Understood.  For his chronic angina, a level 1-2 acceptable on exertion and further intervention for 12 lead ekg is not warranted? or However we would have him rest/reduce workload." ? ?Dr. Servando Salina "Sounds like a plan to me" ? ?Issiac Jamar Les Pou RN: "Randie Heinz, thanks for your guidance!" ? ?Dr. Servando Salina "you are welcome. I appreciate you working with [place]. Thanks for your patience" ?

## 2021-05-21 ENCOUNTER — Telehealth (HOSPITAL_COMMUNITY): Payer: Self-pay

## 2021-05-21 ENCOUNTER — Inpatient Hospital Stay (HOSPITAL_COMMUNITY): Admission: RE | Admit: 2021-05-21 | Payer: Medicare Other | Source: Ambulatory Visit

## 2021-05-21 ENCOUNTER — Telehealth (HOSPITAL_COMMUNITY): Payer: Self-pay | Admitting: *Deleted

## 2021-05-21 NOTE — Telephone Encounter (Signed)
Pt called and stated he will not be able to make it b/c he is still packing to move. He will contact [facility] when he is ready to schedule. ?  ?Closed referral ?

## 2021-05-21 NOTE — Telephone Encounter (Signed)
Pt called to cancel his appt scheduled for this morning for cardiac rehab.  Pt stated that he was up all night packing as he is making preparations to move from [place] to a motel here in [place] while he awaits section 8 housing.  Will close this referral.  Unfortunately due to Medicaid regulatory criteria for scheduling, cardiac rehab must begin 6 months from the cardiac event. Pt event was on 12/01/2020. Cherre Huger, BSN ?Cardiac and Pulmonary Rehab Nurse Navigator  ?  ?

## 2021-05-25 ENCOUNTER — Ambulatory Visit (HOSPITAL_COMMUNITY): Payer: Medicare Other

## 2021-05-27 ENCOUNTER — Ambulatory Visit (HOSPITAL_COMMUNITY): Payer: Medicare Other

## 2021-05-28 ENCOUNTER — Telehealth: Payer: Self-pay | Admitting: *Deleted

## 2021-05-28 DIAGNOSIS — Z006 Encounter for examination for normal comparison and control in clinical research program: Secondary | ICD-10-CM

## 2021-05-28 NOTE — Telephone Encounter (Signed)
I called patient about the Prevail study which is looking at a drug that can lower LDL. I was unable to leave message since patient had no voice mail or e-mail. ?

## 2021-05-29 ENCOUNTER — Ambulatory Visit (HOSPITAL_COMMUNITY): Payer: Medicare Other

## 2021-06-01 ENCOUNTER — Ambulatory Visit (HOSPITAL_COMMUNITY): Payer: Medicare Other

## 2021-06-03 ENCOUNTER — Ambulatory Visit (HOSPITAL_COMMUNITY): Payer: Medicare Other

## 2021-06-05 ENCOUNTER — Ambulatory Visit (HOSPITAL_COMMUNITY): Payer: Medicare Other

## 2021-06-08 ENCOUNTER — Ambulatory Visit (HOSPITAL_COMMUNITY): Payer: Medicare Other

## 2021-06-10 ENCOUNTER — Ambulatory Visit (HOSPITAL_COMMUNITY): Payer: Medicare Other

## 2021-06-12 ENCOUNTER — Ambulatory Visit (HOSPITAL_COMMUNITY): Payer: Medicare Other

## 2021-06-15 ENCOUNTER — Ambulatory Visit (HOSPITAL_COMMUNITY): Payer: Medicare Other

## 2021-06-17 ENCOUNTER — Ambulatory Visit (HOSPITAL_COMMUNITY): Payer: Medicare Other

## 2021-06-19 ENCOUNTER — Ambulatory Visit (HOSPITAL_COMMUNITY): Payer: Medicare Other

## 2021-06-22 ENCOUNTER — Ambulatory Visit (HOSPITAL_COMMUNITY): Payer: Medicare Other

## 2021-06-24 ENCOUNTER — Ambulatory Visit (HOSPITAL_COMMUNITY): Payer: Medicare Other

## 2021-06-26 ENCOUNTER — Ambulatory Visit (HOSPITAL_COMMUNITY): Payer: Medicare Other

## 2021-06-29 ENCOUNTER — Ambulatory Visit (HOSPITAL_COMMUNITY): Payer: Medicare Other

## 2021-07-01 ENCOUNTER — Ambulatory Visit (HOSPITAL_COMMUNITY): Payer: Medicare Other

## 2021-07-03 ENCOUNTER — Ambulatory Visit (HOSPITAL_COMMUNITY): Payer: Medicare Other

## 2021-07-06 ENCOUNTER — Ambulatory Visit (HOSPITAL_COMMUNITY): Payer: Medicare Other

## 2021-07-08 ENCOUNTER — Ambulatory Visit (HOSPITAL_COMMUNITY): Payer: Medicare Other

## 2021-07-10 ENCOUNTER — Ambulatory Visit (HOSPITAL_COMMUNITY): Payer: Medicare Other

## 2021-07-13 ENCOUNTER — Ambulatory Visit (HOSPITAL_COMMUNITY): Payer: Medicare Other

## 2021-07-15 ENCOUNTER — Ambulatory Visit (HOSPITAL_COMMUNITY): Payer: Medicare Other

## 2021-07-17 ENCOUNTER — Ambulatory Visit (HOSPITAL_COMMUNITY): Payer: Medicare Other

## 2021-11-03 ENCOUNTER — Ambulatory Visit: Admit: 2021-11-03 | Discharge: 2021-11-03 | Disposition: A | Discharge status: Home / Self Care | Payer: MEDICARE

## 2021-11-03 ENCOUNTER — Emergency Department: Admit: 2021-11-03 | Discharge: 2021-11-03 | Disposition: A | Discharge status: Home / Self Care | Payer: MEDICARE

## 2021-11-03 DIAGNOSIS — I208 Other forms of angina pectoris: Principal | ICD-10-CM

## 2022-04-05 ENCOUNTER — Telehealth: Payer: Self-pay

## 2022-04-05 DIAGNOSIS — I739 Peripheral vascular disease, unspecified: Secondary | ICD-10-CM

## 2022-04-05 NOTE — Telephone Encounter (Signed)
Pt called stating that he had a bypass last year and he was c/o severe pain.  Reviewed pt's chart, returned call for clarification, two identifiers used. Pt stated that the pain began approx 1.5 wks ago. The pain increases with ambulation, but he experiences a constant throbbing pain while lying down. He notes some slight swelling and tenderness. He has been taking Tylenol, Voltaren gel, and has been elevating. Pt denies any cold areas, discolorations, or open wounds. Pt has not been seen since 12/2019 after his initial post-op appt. Appts for [place] & [place] scheduled. Confirmed understanding.

## 2022-04-15 ENCOUNTER — Ambulatory Visit (INDEPENDENT_AMBULATORY_CARE_PROVIDER_SITE_OTHER): Payer: 59 | Admitting: Physician Assistant

## 2022-04-15 ENCOUNTER — Ambulatory Visit (HOSPITAL_COMMUNITY)
Admission: RE | Admit: 2022-04-15 | Discharge: 2022-04-15 | Disposition: A | Discharge status: Home / Self Care | Payer: 59 | Source: Ambulatory Visit | Attending: Vascular Surgery | Admitting: Vascular Surgery

## 2022-04-15 ENCOUNTER — Ambulatory Visit (INDEPENDENT_AMBULATORY_CARE_PROVIDER_SITE_OTHER)
Admission: RE | Admit: 2022-04-15 | Discharge: 2022-04-15 | Disposition: A | Discharge status: Home / Self Care | Payer: 59 | Source: Ambulatory Visit | Attending: Vascular Surgery | Admitting: Vascular Surgery

## 2022-04-15 ENCOUNTER — Encounter: Payer: Self-pay | Admitting: Physician Assistant

## 2022-04-15 VITALS — BP 148/97 | HR 66 | Temp 98.7°F | Resp 20 | Ht 73.0 in | Wt 208.7 lb

## 2022-04-15 DIAGNOSIS — I739 Peripheral vascular disease, unspecified: Secondary | ICD-10-CM | POA: Insufficient documentation

## 2022-04-15 LAB — VAS US ABI WITH/WO TBI
Left ABI: 0.89
Right ABI: 0.96

## 2022-04-15 NOTE — Progress Notes (Signed)
[facility] [place] HISTORY AND PHYSICAL   History of Present Illness:  Patient is a 57 y.o. year old male who presents for evaluation of  PAD with critical limb ischemia in the right LE.  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 He underwent a right femoral to below-knee popliteal artery bypass with PTFE as he had no available vein.  His surgery was performed by Dr. Scot Dock on 12/25/19.    He has not been seen since his initial f/u post op visit.  He has anterior hip/thigh pain.  It seems to be worse when he moves from a seated position to a standing/walking position.  His history includes the right LE bypass and a significant lumbar pain history requiring pain management by his PCP.  He receives 75 percocet q 30 days 7.5 mg.     He is medically managed with ASA, Statin and Plavix daily.         Past Medical History:  Diagnosis Date   Abnormal stress test    Anxiety    Asthma    as a child   Atypical chest pain 05/07/2017   Coronary artery disease    DDD (degenerative disc disease), lumbar 11/08/2019   Formatting of this note might be different from the original. 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 [place]   Hyperlipidemia    Myocardial infarction [facility]) 2017   PVD (peripheral vascular disease) ([place]) 09/21/2019   Shortness of breath    Tobacco use disorder     Past Surgical History:  Procedure Laterality Date   ABDOMINAL AORTOGRAM W/LOWER EXTREMITY Bilateral 09/21/2019   Procedure: ABDOMINAL AORTOGRAM W/LOWER EXTREMITY;  Surgeon: Angelia Mould, MD;  Location: [facility];  Service: Cardiovascular;  Laterality: Bilateral;   ABDOMINAL AORTOGRAM W/LOWER EXTREMITY N/A 12/07/2019   Procedure: ABDOMINAL AORTOGRAM W/LOWER EXTREMITY;  Surgeon: Angelia Mould, MD;  Location: [place] [facility];  Service: Cardiovascular;  Laterality: N/A;   CARDIAC  CATHETERIZATION     CORONARY ARTERY BYPASS GRAFT     quadruple bypas 2017   CORONARY STENT INTERVENTION N/A 11/26/2019   Procedure: CORONARY STENT INTERVENTION;  Surgeon: Nelva Bush, MD;  Location: [place] [facility];  Service: Cardiovascular;  Laterality: N/A;   FEMORAL-POPLITEAL BYPASS GRAFT Right 12/25/2019   Procedure: BYPASS GRAFT FEMORAL- BELOW KNEE POPLITEAL ARTERY RIGHT USING 6MM GORE PROPATEN VASCULAR GRAFT;  Surgeon: Angelia Mould, MD;  Location: [facility];  Service: Vascular;  Laterality: Right;   LEFT HEART CATH AND CORS/GRAFTS ANGIOGRAPHY N/A 11/26/2019   Procedure: LEFT HEART CATH AND CORS/GRAFTS ANGIOGRAPHY;  Surgeon: Nelva Bush, MD;  Location: [facility];  Service: Cardiovascular;  Laterality: N/A;   LEFT HEART CATH AND CORS/GRAFTS ANGIOGRAPHY N/A 12/01/2020   Procedure: LEFT HEART CATH AND CORS/GRAFTS ANGIOGRAPHY;  Surgeon: Troy Sine, MD;  Location: [facility];  Service: Cardiovascular;  Laterality: N/A;   LOWER EXTREMITY ANGIOGRAM Right 12/25/2019   Procedure: RIGHT LOWER EXTREMITY ANGIOGRAM;  Surgeon: Angelia Mould, MD;  Location: [place];  Service: Vascular;  Laterality: Right;   PERIPHERAL VASCULAR BALLOON ANGIOPLASTY Left 12/07/2019   Procedure: PERIPHERAL VASCULAR BALLOON ANGIOPLASTY;  Surgeon: Angelia Mould, MD;  Location: [facility];  Service: Cardiovascular;  Laterality: Left;  SFA    ROS:   General:  No weight loss, Fever, chills  HEENT: No recent  headaches, no nasal bleeding, no visual changes, no sore throat  Neurologic: No dizziness, blackouts, seizures. No recent symptoms of stroke or mini- stroke. No recent episodes of slurred speech, or temporary blindness.  Cardiac: No recent episodes of chest pain/pressure, no shortness of breath at rest.  No shortness of breath with exertion.  Denies history of atrial fibrillation or irregular heartbeat  Vascular: No history of rest pain in feet.  No history of  claudication.  No history of non-healing ulcer, No history of DVT   Pulmonary: No home oxygen, no productive cough, no hemoptysis,  No asthma or wheezing  Musculoskeletal:  [ ]$  Arthritis, [ ]$  Low back pain,  [x ] Joint pain  Hematologic:No history of hypercoagulable state.  No history of easy bleeding.  No history of anemia  Gastrointestinal: No hematochezia or melena,  No gastroesophageal reflux, no trouble swallowing  Urinary: [ ]$  chronic Kidney disease, [ ]$  on HD - [ ]$  MWF or [ ]$  TTHS, [ ]$  Burning with urination, [ ]$  Frequent urination, [ ]$  Difficulty urinating;   Skin: No rashes  Psychological: No history of anxiety,  No history of depression  Social History Social History   Tobacco Use   Smoking status: Every Day    Years: 25.00    Types: Cigars, Cigarettes    Passive exposure: Current (Wife is a smoker)   Smokeless tobacco: Never   Tobacco comments:    as of 12/20/19 2 cigararettes/day  Vaping Use   Vaping Use: Never used  Substance Use Topics   Alcohol use: No   Drug use: No    Family History Family History  Problem Relation Age of Onset   Heart disease Mother    Heart disease Father    Heart disease Sister     Allergies  Allergies  Allergen Reactions   Imdur [Isosorbide Nitrate] Other (See Comments)    Severe headache     Current Outpatient Medications  Medication Sig Dispense Refill   aspirin EC 81 MG tablet Take 2 tablets (162 mg total) by mouth daily. 30 tablet 0   atenolol (TENORMIN) 50 MG tablet Take 50 mg by mouth daily.     atorvastatin (LIPITOR) 80 MG tablet Take 1 tablet (80 mg total) by mouth daily. 90 tablet 3   cilostazol (PLETAL) 100 MG tablet Take 100 mg by mouth 2 (two) times daily.     losartan (COZAAR) 25 MG tablet TAKE 1 TABLET(25 MG) BY MOUTH DAILY 90 tablet 3   oxyCODONE-acetaminophen (PERCOCET/ROXICET) 5-325 MG tablet Take 1 tablet by mouth every 4 (four) hours as needed for severe pain. 25 tablet 0   amLODipine (NORVASC) 5 MG  tablet Take 1 tablet (5 mg total) by mouth daily. (Patient not taking: Reported on 04/15/2022) 90 tablet 3   clopidogrel (PLAVIX) 75 MG tablet Take 1 tablet (75 mg total) by mouth daily. (Patient not taking: Reported on 04/15/2022) 90 tablet 3   Evolocumab (REPATHA SURECLICK) [id number] MG/ML SOAJ Inject the contents of 1 pen (140 mg) under the skin every fourteen (14) days. (Patient not taking: Reported on 04/15/2022) 6 mL 3   gabapentin (NEURONTIN) 600 MG tablet Take 600 mg by mouth 2 (two) times daily as needed (Pain).  (Patient not taking: Reported on 04/15/2022)     metoprolol succinate (TOPROL XL) 25 MG 24 hr tablet Take 1 tablet (25 mg total) by mouth daily. (Patient not taking: Reported on 04/15/2022) 90 tablet 3   pantoprazole (PROTONIX) 40 MG tablet Take 1  tablet (40 mg total) by mouth daily. (Patient not taking: Reported on 04/09/2021) 30 tablet 11   ranolazine (RANEXA) 1000 MG SR tablet Take 1 tablet (1,000 mg total) by mouth 2 (two) times daily. (Patient not taking: Reported on 04/15/2022) 90 tablet 3   Current Facility-Administered Medications  Medication Dose Route Frequency Provider Last Rate Last Admin   sodium chloride flush (NS) 0.9 % injection 3 mL  3 mL Intravenous Q12H Tobb, Kardie, DO        Physical Examination  Vitals:   04/15/22 1205  BP: (!) 148/97  Pulse: 66  Resp: 20  Temp: 98.7 F (37.1 C)  TempSrc: Temporal  SpO2: 100%  Weight: 208 lb 11.2 oz (94.7 kg)  Height: 6' 1"$  (1.854 m)    Body mass index is 27.53 kg/m.  General:  Alert and oriented, no acute distress HEENT: Normal Neck: No bruit or JVD Pulmonary: Clear to auscultation bilaterally Cardiac: Regular Rate and Rhythm without murmur Abdomen: Soft, non-tender, non-distended, no mass, no scars Skin: No rash Extremity Pulses:   radial, femoral, doppler dorsalis pedis, posterior tibial pulses bilaterally Musculoskeletal: No deformity or edema, anterior right hip pain with sit to stand and with palpation over hip  joint.  Neurologic: Upper and lower extremity motor intact grossly equal and symmetric  DATA:   ABI Findings:  +---------+------------------+-----+----------+--------+  Right   Rt Pressure (mmHg)IndexWaveform  Comment   +---------+------------------+-----+----------+--------+  Brachial 105                                        +---------+------------------+-----+----------+--------+  PTA     107               0.96 biphasic            +---------+------------------+-----+----------+--------+  DP      98                0.88 monophasicbrisk     +---------+------------------+-----+----------+--------+  Great Toe91                0.81 Normal              +---------+------------------+-----+----------+--------+   +---------+------------------+-----+----------+-------+  Left    Lt Pressure (mmHg)IndexWaveform  Comment  +---------+------------------+-----+----------+-------+  Brachial 112                                       +---------+------------------+-----+----------+-------+  PTA     91                0.81 monophasicbrisk    +---------+------------------+-----+----------+-------+  DP      100               0.89 monophasic         +---------+------------------+-----+----------+-------+  Great Toe72                0.64 Abnormal           +---------+------------------+-----+----------+-------+   +-------+-----------+-----------+------------+------------+  ABI/TBIToday's ABIToday's TBIPrevious ABIPrevious TBI  +-------+-----------+-----------+------------+------------+  Right 0.96       0.81       0.95        0.64          +-------+-----------+-----------+------------+------------+  Left  0.89       0.64       0.86  0.48          +-------+-----------+-----------+------------+------------+         Right ABIs appear essentially unchanged. Left ABIs appear essentially  unchanged.    Summary:   Right: Resting right ankle-brachial index is within normal range. The  right toe-brachial index is normal.   Left: Resting left ankle-brachial index indicates mild left lower  extremity arterial disease. The left toe-brachial index is abnormal.   +----------+--------+-----+--------+---------+--------+  RIGHT    PSV cm/sRatioStenosisWaveform Comments  +----------+--------+-----+--------+---------+--------+  EIA [id number]                  triphasic          +----------+--------+-----+--------+---------+--------+  CFA Prox  116                  triphasic          +----------+--------+-----+--------+---------+--------+       Right Graft #1:  +------------------+--------+--------+---------+--------+                   PSV cm/sStenosisWaveform Comments  +------------------+--------+--------+---------+--------+  Inflow           113             triphasic          +------------------+--------+--------+---------+--------+  Prox Anastomosis  96              biphasic           +------------------+--------+--------+---------+--------+  Proximal Graft    69              triphasic          +------------------+--------+--------+---------+--------+  Mid Graft         55              triphasic          +------------------+--------+--------+---------+--------+  Distal Graft      46              triphasic          +------------------+--------+--------+---------+--------+  Distal Anastomosis60              triphasic          +------------------+--------+--------+---------+--------+  Outflow          52              triphasic          +------------------+--------+--------+---------+--------+     Summary:  Right: Patent right femoral to below knee popliteal bypass graft with no  evidence for restenosis.    ASSESSMENT/PLAN:  Patient is a 57 y.o. year old male who presents for evaluation of  PAD with critical limb ischemia in  the right LE.  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 He underwent a right femoral to below-knee popliteal artery bypass with PTFE as he had no available vein.  His surgery was performed by Dr. Scot Dock on 12/25/19.    The bypass duplex shows a patent bypass with triphasic/biphasic wave forms.  The ABI's show some improvement from prior studies.    The right groin/anterior hip pain into the quad muscles may be symptomatic of right hip arthritis.  He may benefit from seeing an orthopedic.  His blood flow seems stable with comparison from previous studies 11/2019.    He will f/u in 1 year for repeat duplex and ABI's.      Roxy Horseman PA-C [facility] [place] Office: [phone]  MD in clinic [place]

## 2022-05-04 ENCOUNTER — Other Ambulatory Visit: Payer: Self-pay | Admitting: Cardiology

## 2022-05-15 ENCOUNTER — Other Ambulatory Visit: Payer: Self-pay | Admitting: Cardiology

## 2022-05-21 ENCOUNTER — Other Ambulatory Visit: Payer: Self-pay | Admitting: Cardiology

## 2022-06-01 IMAGING — CR DG CHEST 2V
2 series · 2 of 2 positions shown · non-contrast
Comparison: 05/15/2019, chest CT 02/01/2017

CLINICAL DATA: Chest pain.

EXAM:
CHEST - 2 VIEW

[chest pa]
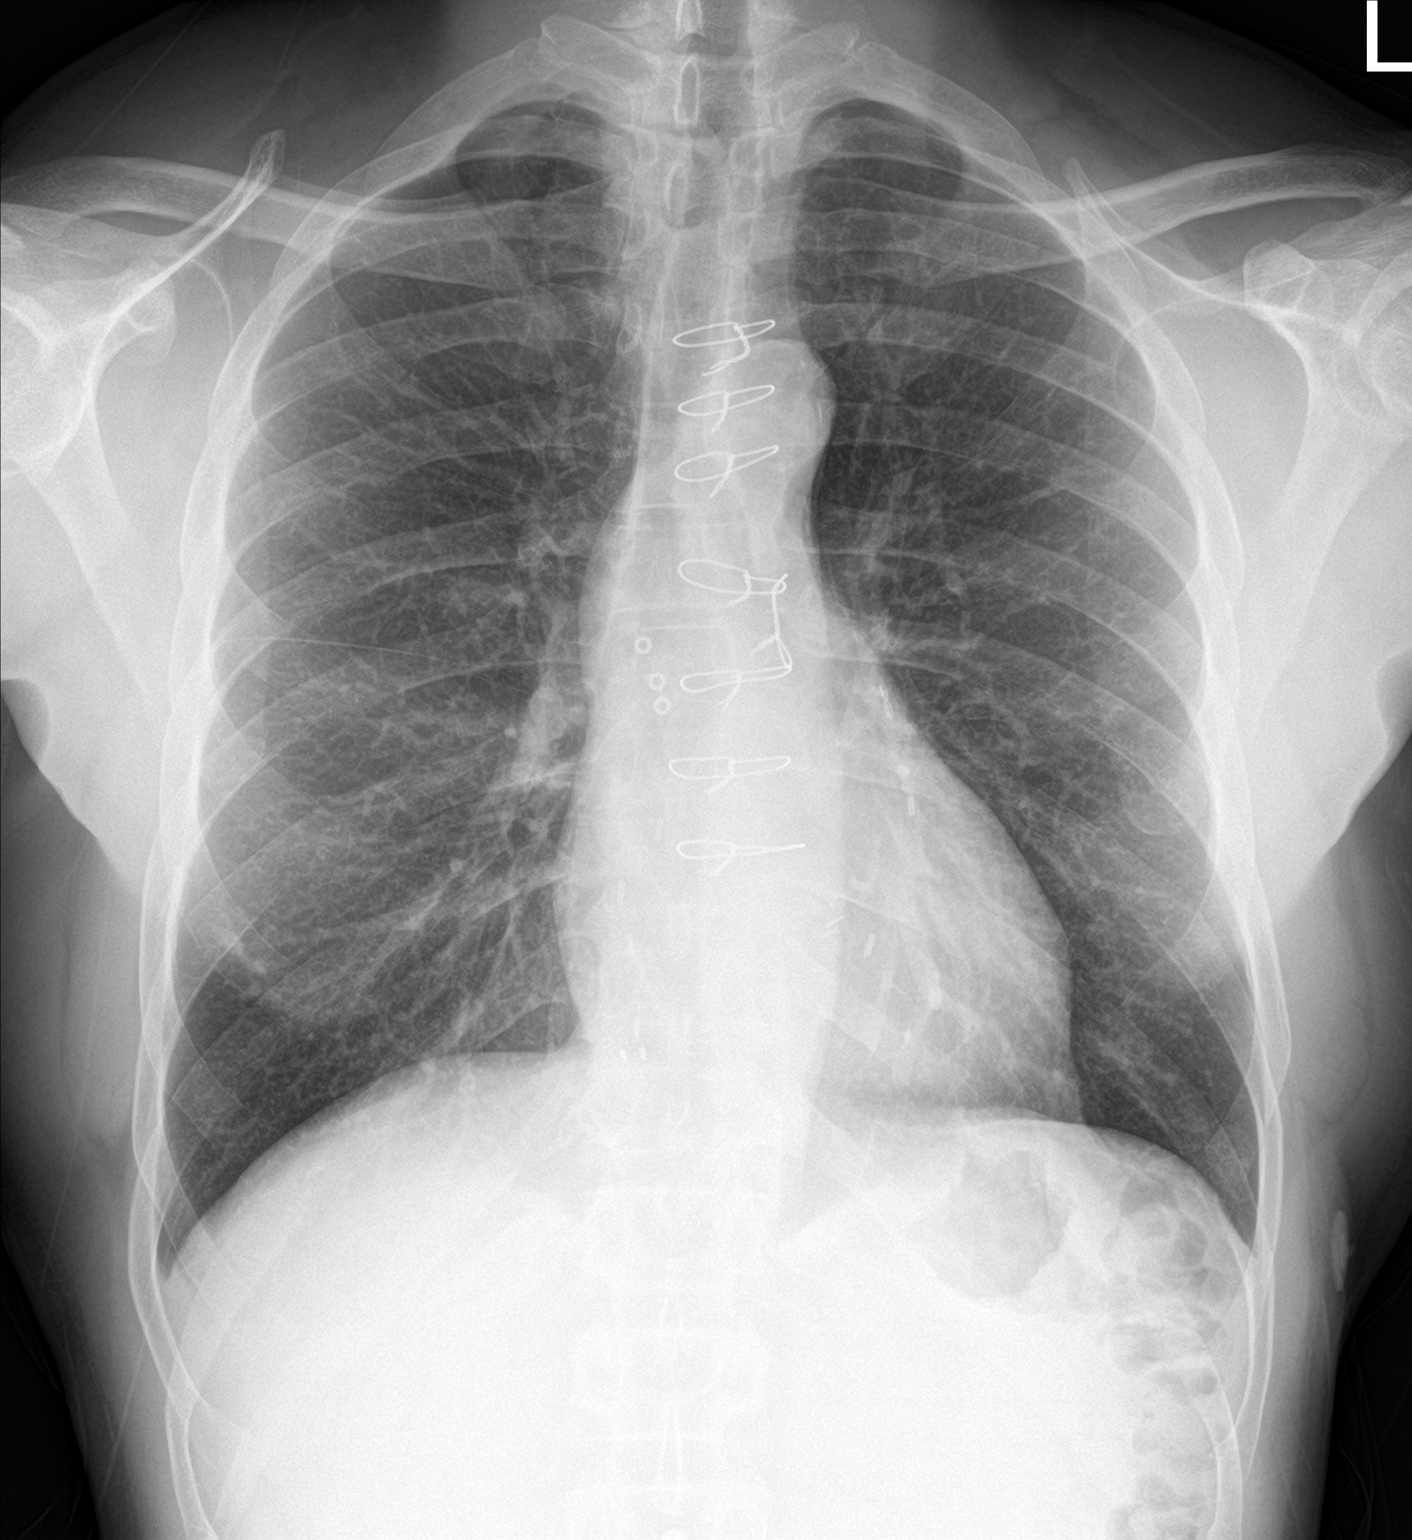

[chest lat]
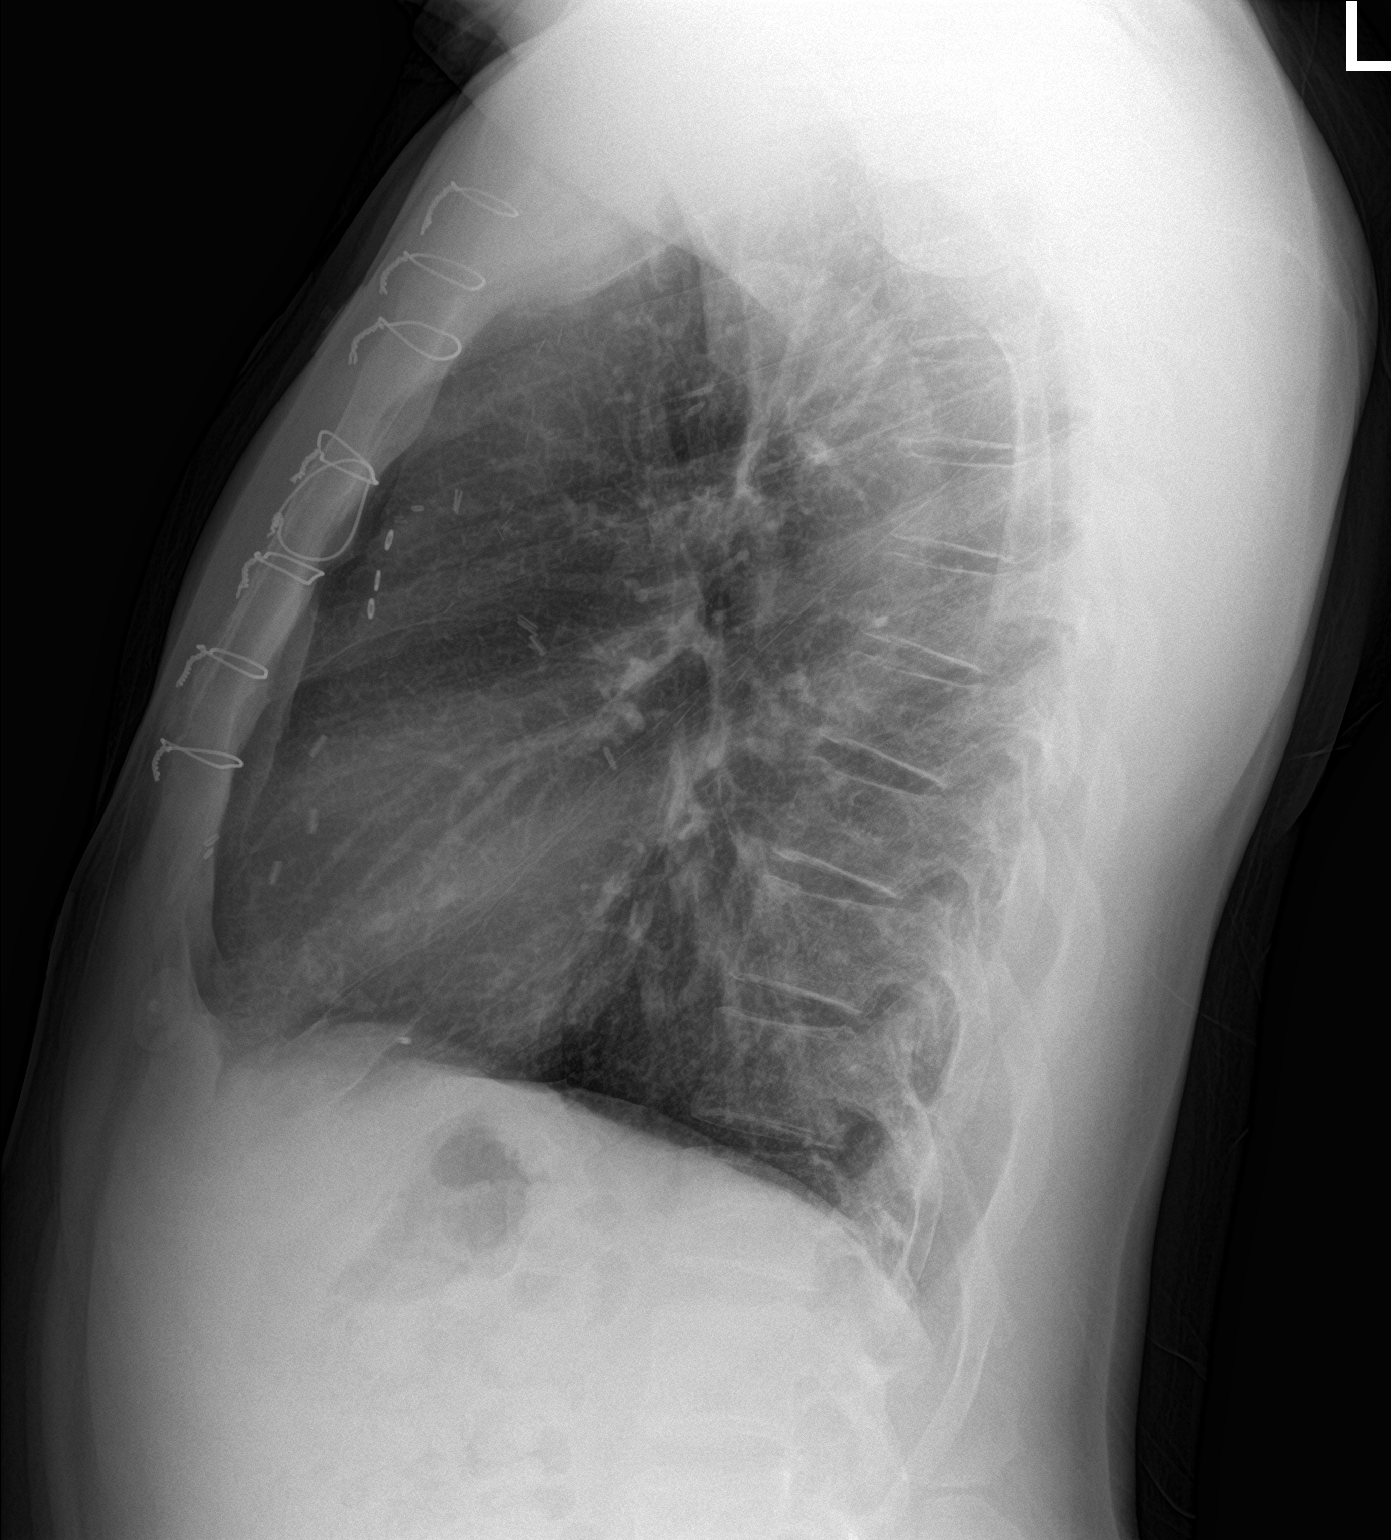

[2 of 2 positions shown; findings below may reference images not displayed]

FINDINGS: Median sternotomy and CABG.The cardiomediastinal contours are
normal. The lungs are clear. Pulmonary vasculature is normal. No
consolidation, pleural effusion, or pneumothorax. No acute osseous
abnormalities are seen. Bone islands in the right anterior sixth
rib, as seen on CT.
IMPRESSION: No acute chest findings.

## 2022-08-20 ENCOUNTER — Telehealth: Payer: Self-pay

## 2022-08-20 DIAGNOSIS — I739 Peripheral vascular disease, unspecified: Secondary | ICD-10-CM

## 2022-08-20 NOTE — Telephone Encounter (Signed)
Caller: Patient  Concern: c/o same symptoms as before his previous bypass graft surgery, swelling x 2 wks that has improved, severe calf pain after ambulating approx 100 ft, and foot burns like fire with ambulating  Location: right leg  Description:  past 1 mo  Aggravating Factors: movement, walking  Quality: burning, similar to prior episodes, and tight (pulling)  Treatments:  rest and stopping ambulation helps pain  Resolution: Appointment scheduled for first available on Dr. Adele Dan clinic day  Next Appt: Appointment scheduled for 08/26/22 for [place] and [place]

## 2022-08-26 ENCOUNTER — Ambulatory Visit (INDEPENDENT_AMBULATORY_CARE_PROVIDER_SITE_OTHER): Payer: 59 | Admitting: Physician Assistant

## 2022-08-26 ENCOUNTER — Ambulatory Visit (HOSPITAL_COMMUNITY)
Admission: RE | Admit: 2022-08-26 | Discharge: 2022-08-26 | Disposition: A | Discharge status: Home / Self Care | Payer: 59 | Source: Ambulatory Visit | Attending: Vascular Surgery | Admitting: Vascular Surgery

## 2022-08-26 ENCOUNTER — Ambulatory Visit (INDEPENDENT_AMBULATORY_CARE_PROVIDER_SITE_OTHER)
Admission: RE | Admit: 2022-08-26 | Discharge: 2022-08-26 | Disposition: A | Discharge status: Home / Self Care | Payer: 59 | Source: Ambulatory Visit | Attending: Vascular Surgery | Admitting: Vascular Surgery

## 2022-08-26 ENCOUNTER — Telehealth: Payer: Self-pay

## 2022-08-26 VITALS — BP 128/84 | HR 68 | Temp 98.1°F | Resp 18 | Ht 73.0 in | Wt 195.0 lb

## 2022-08-26 DIAGNOSIS — I739 Peripheral vascular disease, unspecified: Secondary | ICD-10-CM

## 2022-08-26 DIAGNOSIS — T82898A Other specified complication of vascular prosthetic devices, implants and grafts, initial encounter: Secondary | ICD-10-CM

## 2022-08-26 DIAGNOSIS — I70321 Atherosclerosis of unspecified type of bypass graft(s) of the extremities with rest pain, right leg: Secondary | ICD-10-CM

## 2022-08-26 LAB — VAS US ABI WITH/WO TBI
Left ABI: 0.82
Right ABI: 0.27

## 2022-08-26 MED ORDER — OXYCODONE-ACETAMINOPHEN 5-325 MG PO TABS: 1.0000 | ORAL_TABLET | Freq: Four times a day (QID) | ORAL | 0 refills | Status: DC | PRN

## 2022-08-26 NOTE — Progress Notes (Signed)
Office Note     CC:  follow up Requesting Provider:  Teena Irani, PA-C  HPI: Benjamin Espinoza is a 57 y.o. (30-Oct-1965) male who presents as a urgent triage for severe right lower extremity pain. He has history of right femoral to below knee popliteal bypass with PTFE by Dr. [name] on 12/25/19. This was for CLR with rest pain. At his last visit in February he was doing well. His duplex showed widely patent bypass. He explains that since his last visit in February he began having pain in the right calf and has now progressed to right foot. He says the pain intensified recently over past couple weeks. He has not been able to bear much weight on the leg or foot. Says great toe is extremely tender to the touch. He is using cane to help ambulate. His pain is keeping him awake at night.  He reports being compliant with his Aspirin, Statin, and Pletal. He does still smoke.   Past Medical History:  Diagnosis Date   Abnormal stress test    Anxiety    Asthma    as a child   Atypical chest pain 05/07/2017   Coronary artery disease    DDD (degenerative disc disease), lumbar 11/08/2019   Formatting of this note might be different from the original. 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 [place]   Hyperlipidemia    Myocardial infarction [facility]) 2017   PVD (peripheral vascular disease) (HCC) 09/21/2019   Shortness of breath    Tobacco use disorder     Past Surgical History:  Procedure Laterality Date   ABDOMINAL AORTOGRAM W/LOWER EXTREMITY Bilateral 09/21/2019   Procedure: ABDOMINAL AORTOGRAM W/LOWER EXTREMITY;  Surgeon: [name], MD;  Location: [facility] INVASIVE CV LAB;  Service: Cardiovascular;  Laterality: Bilateral;   ABDOMINAL AORTOGRAM W/LOWER EXTREMITY N/A 12/07/2019   Procedure: ABDOMINAL AORTOGRAM W/LOWER EXTREMITY;  Surgeon: [name], MD;  Location: [facility] INVASIVE CV LAB;  Service: Cardiovascular;  Laterality: N/A;   CARDIAC  CATHETERIZATION     CORONARY ARTERY BYPASS GRAFT     quadruple bypas 2017   CORONARY STENT INTERVENTION N/A 11/26/2019   Procedure: CORONARY STENT INTERVENTION;  Surgeon: Yvonne Kendall, MD;  Location: [facility] INVASIVE CV LAB;  Service: Cardiovascular;  Laterality: N/A;   FEMORAL-POPLITEAL BYPASS GRAFT Right 12/25/2019   Procedure: BYPASS GRAFT FEMORAL- BELOW KNEE POPLITEAL ARTERY RIGHT USING GORE PROPATEN VASCULAR GRAFT;  Surgeon: [name], MD;  Location: [facility] OR;  Service: Vascular;  Laterality: Right;   LEFT HEART CATH AND CORS/GRAFTS ANGIOGRAPHY N/A 11/26/2019   Procedure: LEFT HEART CATH AND CORS/GRAFTS ANGIOGRAPHY;  Surgeon: Yvonne Kendall, MD;  Location: [facility] INVASIVE CV LAB;  Service: Cardiovascular;  Laterality: N/A;   LEFT HEART CATH AND CORS/GRAFTS ANGIOGRAPHY N/A 12/01/2020   Procedure: LEFT HEART CATH AND CORS/GRAFTS ANGIOGRAPHY;  Surgeon: Lennette Bihari, MD;  Location: [facility] INVASIVE CV LAB;  Service: Cardiovascular;  Laterality: N/A;   LOWER EXTREMITY ANGIOGRAM Right 12/25/2019   Procedure: RIGHT LOWER EXTREMITY ANGIOGRAM;  Surgeon: [name], MD;  Location: [facility] OR;  Service: Vascular;  Laterality: Right;   PERIPHERAL VASCULAR BALLOON ANGIOPLASTY Left 12/07/2019   Procedure: PERIPHERAL VASCULAR BALLOON ANGIOPLASTY;  Surgeon: [name], MD;  Location: [facility] INVASIVE CV LAB;  Service: Cardiovascular;  Laterality: Left;  SFA    Social History   Socioeconomic History   Marital status: Married    Spouse name: Not on file  Number of children: Not on file   Years of education: Not on file   Highest education level: Not on file  Occupational History   Not on file  Tobacco Use   Smoking status: Every Day    Years: 25    Types: Cigars, Cigarettes    Passive exposure: Current (Wife is a smoker)   Smokeless tobacco: Never   Tobacco comments:    as of 12/20/19 2 cigararettes/day  Vaping Use   Vaping Use: Never used  Substance and Sexual Activity    Alcohol use: No   Drug use: No   Sexual activity: Not on file  Other Topics Concern   Not on file  Social History Narrative   Not on file   Social Determinants of Health   Financial Resource Strain: Not on file  Food Insecurity: Not on file  Transportation Needs: Not on file  Physical Activity: Not on file  Stress: Not on file  Social Connections: Not on file  Intimate Partner Violence: Not on file    Family History  Problem Relation Age of Onset   Heart disease Mother    Heart disease Father    Heart disease Sister     Current Outpatient Medications  Medication Sig Dispense Refill   amLODipine (NORVASC) 5 MG tablet TAKE 1 TABLET(5 MG) BY MOUTH DAILY 90 tablet 0   aspirin EC 81 MG tablet Take 2 tablets (162 mg total) by mouth daily. 30 tablet 0   atenolol (TENORMIN) 50 MG tablet Take 50 mg by mouth daily.     atorvastatin (LIPITOR) 80 MG tablet Take 1 tablet (80 mg total) by mouth daily. 90 tablet 3   cilostazol (PLETAL) 100 MG tablet Take 100 mg by mouth 2 (two) times daily.     clopidogrel (PLAVIX) 75 MG tablet TAKE 1 TABLET(75 MG) BY MOUTH DAILY 90 tablet 3   Evolocumab (REPATHA SURECLICK) 140 MG/ML SOAJ Inject the contents of 1 pen (140 mg) under the skin every fourteen (14) days. (Patient not taking: Reported on 04/15/2022) 6 mL 3   gabapentin (NEURONTIN) 600 MG tablet Take 600 mg by mouth 2 (two) times daily as needed (Pain).  (Patient not taking: Reported on 04/15/2022)     losartan (COZAAR) 25 MG tablet TAKE 1 TABLET(25 MG) BY MOUTH DAILY 90 tablet 3   metoprolol succinate (TOPROL XL) 25 MG 24 hr tablet Take 1 tablet (25 mg total) by mouth daily. (Patient not taking: Reported on 04/15/2022) 90 tablet 3   oxyCODONE-acetaminophen (PERCOCET/ROXICET) 5-325 MG tablet Take 1 tablet by mouth every 4 (four) hours as needed for severe pain. 25 tablet 0   pantoprazole (PROTONIX) 40 MG tablet Take 1 tablet (40 mg total) by mouth daily. (Patient not taking: Reported on 04/09/2021) 30  tablet 11   ranolazine (RANEXA) 1000 MG SR tablet Take 1 tablet (1,000 mg total) by mouth 2 (two) times daily. (Patient not taking: Reported on 04/15/2022) 90 tablet 3   Current Facility-Administered Medications  Medication Dose Route Frequency Provider Last Rate Last Admin   sodium chloride flush (NS) 0.9 % injection 3 mL  3 mL Intravenous Q12H Tobb, Kardie, DO        Allergies  Allergen Reactions   Imdur [Isosorbide Nitrate] Other (See Comments)    Severe headache     REVIEW OF SYSTEMS:   [X]  denotes positive finding, [ ]  denotes negative finding Cardiac  Comments:  Chest pain or chest pressure:    Shortness of breath upon exertion:  Short of breath when lying flat:    Irregular heart rhythm:        Vascular    Pain in calf, thigh, or hip brought on by ambulation:    Pain in feet at night that wakes you up from your sleep:     Blood clot in your veins:    Leg swelling:         Pulmonary    Oxygen at home:    Productive cough:     Wheezing:         Neurologic    Sudden weakness in arms or legs:     Sudden numbness in arms or legs:     Sudden onset of difficulty speaking or slurred speech:    Temporary loss of vision in one eye:     Problems with dizziness:         Gastrointestinal    Blood in stool:     Vomited blood:         Genitourinary    Burning when urinating:     Blood in urine:        Psychiatric    Major depression:         Hematologic    Bleeding problems:    Problems with blood clotting too easily:        Skin    Rashes or ulcers:        Constitutional    Fever or chills:      PHYSICAL EXAMINATION:  Vitals:   08/26/22 0815  BP: 128/84  Pulse: 68  Resp: 18  Temp: 98.1 F (36.7 C)  TempSrc: Temporal  SpO2: 95%  Weight: 195 lb (88.5 kg)  Height: 6\' 1"  (1.854 m)    General:  WDWN in NAD; vital signs documented above Gait: not observed, transfers  with Cane, using wheel chair HENT: WNL, normocephalic Pulmonary: normal  non-labored breathing , without wheezing Cardiac: regular HR Abdomen: soft, NT, no masses Vascular Exam/Pulses: 2+ femoral bilaterally, no palpable distal pulses on RLE. Doppler monophasic PT and doppler popliteal signal on right. Brisk doppler DP/ PT on left Extremities: without ischemic changes, without Gangrene , without cellulitis; without open wounds;  Musculoskeletal: no muscle wasting or atrophy  Neurologic: A&O X 3 Psychiatric:  The pt has Normal affect.   Non-Invasive Vascular Imaging:   +-------+-----------+-----------+------------+------------+  ABI/TBIToday's ABIToday's TBIPrevious ABIPrevious TBI  +-------+-----------+-----------+------------+------------+  Right 0.27       0          0.86        0.81          +-------+-----------+-----------+------------+------------+  Left  0.82       0.42       0.89        0.64          +-------+-----------+-----------+------------+------------+   VAS [place] lower extremity bypass graft duplex:   +-----------+--------+-----+--------+-------------------+--------+  RIGHT     PSV cm/sRatioStenosisWaveform           Comments  +-----------+--------+-----+--------+-------------------+--------+  ATA Distal 4                    continuous                   +-----------+--------+-----+--------+-------------------+--------+  PTA Distal 13                   dampened monophasic          +-----------+--------+-----+--------+-------------------+--------+  PERO Distal13  dampened monophasic          +-----------+--------+-----+--------+-------------------+--------+     Right Graft #1: Fem- BK pop  +------------------+--------+--------+--------+----------------------+                   PSV cm/sStenosisWaveformComments                +------------------+--------+--------+--------+----------------------+  Inflow           50                      Pre occlusive waveform   +------------------+--------+--------+--------+----------------------+  Prox Anastomosis          occluded                                +------------------+--------+--------+--------+----------------------+  Proximal Graft            occluded                                +------------------+--------+--------+--------+----------------------+  Mid Graft                 occluded                                +------------------+--------+--------+--------+----------------------+  Distal Graft              occluded                                +------------------+--------+--------+--------+----------------------+  Distal Anastomosis                        Not visualized          +------------------+--------+--------+--------+----------------------+  Outflow                                  Not visualized          +------------------+--------+--------+--------+----------------------+    Summary:  Right: Occluded fem-BK pop bypass graft. Unable to visualize the distal anastomosis or outflow.    ASSESSMENT/PLAN:: 57 y.o. male here for follow up for PAD. He presents with rest pain on RLE. Not very clear exactly when pain began but has been present for 2-3 months now. Duplex today shows occluded RLE bypass. ABI with significant drop drop from 0.86 to 0.27 and TBI is now absent.  - Since unknown time of the bypass occlusion do not think he is a candidate for thrombolysis - I recommend Angiogram in the near future to determine what targets he has for likely redo bypass  - I provided him with short course of Percocet as his next visit with his PCP is not until 7/5. He takes regularly prescribed narcotic pain medication from PCP but has had to increase his use due to his foot pain so he is out of his pain medication for the month - I will arrange Aortogram, Arteriogram of RLE bypass graft in the near future with Dr. [name]. Will also have our surgery scheduler  offer next available Angiogram if patient is okay with this as to expedite his care pending OR availability   Graceann Congress, PA-C [facility] [phone]  Clinic MD:   [name]

## 2022-08-26 NOTE — Telephone Encounter (Signed)
Attempted to schedule patient for aortogram with Dr. [name]. Patient abruptly stated "I'm busy and will have to call you back", then hung up the call.

## 2022-08-30 ENCOUNTER — Other Ambulatory Visit: Payer: Self-pay

## 2022-08-30 DIAGNOSIS — T82898A Other specified complication of vascular prosthetic devices, implants and grafts, initial encounter: Secondary | ICD-10-CM

## 2022-08-30 DIAGNOSIS — I70321 Atherosclerosis of unspecified type of bypass graft(s) of the extremities with rest pain, right leg: Secondary | ICD-10-CM

## 2022-08-30 DIAGNOSIS — I739 Peripheral vascular disease, unspecified: Secondary | ICD-10-CM

## 2022-09-01 ENCOUNTER — Telehealth: Payer: Self-pay

## 2022-09-01 NOTE — Telephone Encounter (Signed)
Attempted to reach pt again to r/s his procedure. Pt did not answer. I have also called his alternate contact listed as his sister and left her a voicemail as well.

## 2022-09-03 NOTE — Telephone Encounter (Signed)
Spoke with patient regarding the need to reschedule aortogram. Patient does not want to wait until Dr. Adele Dan next availability on 7/29 and requested to be scheduled with another provider. Scheduled patient with Dr. Chestine Spore on 7/11. Instructions reviewed and he verbalized understanding.

## 2022-09-03 NOTE — Telephone Encounter (Signed)
Left VM for patient to return call, to reschedule aortogram from 7/15 to an alternate date.

## 2022-09-03 NOTE — Addendum Note (Signed)
Addended by: Primitivo Gauze on: 09/03/2022 12:34 PM   Modules accepted: Orders

## 2022-09-09 ENCOUNTER — Ambulatory Visit (HOSPITAL_COMMUNITY)
Admission: RE | Admit: 2022-09-09 | Discharge: 2022-09-09 | Disposition: A | Discharge status: Home / Self Care | Payer: 59 | Attending: Vascular Surgery | Admitting: Vascular Surgery

## 2022-09-09 ENCOUNTER — Ambulatory Visit (HOSPITAL_BASED_OUTPATIENT_CLINIC_OR_DEPARTMENT_OTHER): Payer: 59

## 2022-09-09 ENCOUNTER — Other Ambulatory Visit: Payer: Self-pay

## 2022-09-09 ENCOUNTER — Encounter (HOSPITAL_COMMUNITY)
Admission: RE | Disposition: A | Discharge status: Home / Self Care | Payer: Self-pay | Source: Home / Self Care | Attending: Vascular Surgery

## 2022-09-09 DIAGNOSIS — I739 Peripheral vascular disease, unspecified: Secondary | ICD-10-CM

## 2022-09-09 DIAGNOSIS — Z7902 Long term (current) use of antithrombotics/antiplatelets: Secondary | ICD-10-CM | POA: Insufficient documentation

## 2022-09-09 DIAGNOSIS — Z79899 Other long term (current) drug therapy: Secondary | ICD-10-CM | POA: Insufficient documentation

## 2022-09-09 DIAGNOSIS — T82898A Other specified complication of vascular prosthetic devices, implants and grafts, initial encounter: Secondary | ICD-10-CM

## 2022-09-09 DIAGNOSIS — Z7982 Long term (current) use of aspirin: Secondary | ICD-10-CM | POA: Diagnosis not present

## 2022-09-09 DIAGNOSIS — F17210 Nicotine dependence, cigarettes, uncomplicated: Secondary | ICD-10-CM | POA: Diagnosis not present

## 2022-09-09 DIAGNOSIS — I7090 Unspecified atherosclerosis: Secondary | ICD-10-CM | POA: Diagnosis not present

## 2022-09-09 DIAGNOSIS — I70321 Atherosclerosis of unspecified type of bypass graft(s) of the extremities with rest pain, right leg: Secondary | ICD-10-CM

## 2022-09-09 DIAGNOSIS — I70221 Atherosclerosis of native arteries of extremities with rest pain, right leg: Secondary | ICD-10-CM | POA: Diagnosis present

## 2022-09-09 HISTORY — PX: ABDOMINAL AORTOGRAM W/LOWER EXTREMITY: CATH118223

## 2022-09-09 LAB — POCT I-STAT, CHEM 8
BUN: 25 mg/dL — ABNORMAL HIGH (ref 6–20)
BUN: 27 mg/dL — ABNORMAL HIGH (ref 6–20)
BUN: 19 mg/dL (ref 6–20)
Calcium, Ion: 1.12 mmol/L — ABNORMAL LOW (ref 1.15–1.40)
Calcium, Ion: 1.17 mmol/L (ref 1.15–1.40)
Calcium, Ion: 1.32 mmol/L (ref 1.15–1.40)
Chloride: 107 mmol/L (ref 98–111)
Chloride: 109 mmol/L (ref 98–111)
Chloride: 105 mmol/L (ref 98–111)
Creatinine, Ser: 1.4 mg/dL — ABNORMAL HIGH (ref 0.61–1.24)
Creatinine, Ser: 1.5 mg/dL — ABNORMAL HIGH (ref 0.61–1.24)
Creatinine, Ser: 1.5 mg/dL — ABNORMAL HIGH (ref 0.61–1.24)
Glucose, Bld: 85 mg/dL (ref 70–99)
Glucose, Bld: 89 mg/dL (ref 70–99)
Glucose, Bld: 82 mg/dL (ref 70–99)
HCT: 42 % (ref 39.0–52.0)
HCT: 43 % (ref 39.0–52.0)
HCT: 44 % (ref 39.0–52.0)
Hemoglobin: 14.3 g/dL (ref 13.0–17.0)
Hemoglobin: 14.6 g/dL (ref 13.0–17.0)
Hemoglobin: 15 g/dL (ref 13.0–17.0)
Potassium: 6.8 mmol/L (ref 3.5–5.1)
Potassium: 7.9 mmol/L (ref 3.5–5.1)
Potassium: 4.8 mmol/L (ref 3.5–5.1)
Sodium: 139 mmol/L (ref 135–145)
Sodium: 140 mmol/L (ref 135–145)
Sodium: 143 mmol/L (ref 135–145)
TCO2: 27 mmol/L (ref 22–32)
TCO2: 30 mmol/L (ref 22–32)
TCO2: 28 mmol/L (ref 22–32)

## 2022-09-09 LAB — BASIC METABOLIC PANEL
Anion gap: 8 (ref 5–15)
BUN: 16 mg/dL (ref 6–20)
CO2: 25 mmol/L (ref 22–32)
Calcium: 9.8 mg/dL (ref 8.9–10.3)
Chloride: 107 mmol/L (ref 98–111)
Creatinine, Ser: 1.5 mg/dL — ABNORMAL HIGH (ref 0.61–1.24)
GFR, Estimated: 54 mL/min — ABNORMAL LOW (ref >60)
Glucose, Bld: 85 mg/dL (ref 70–99)
Potassium: 4.2 mmol/L (ref 3.5–5.1)
Sodium: 140 mmol/L (ref 135–145)

## 2022-09-09 SURGERY — ABDOMINAL AORTOGRAM W/LOWER EXTREMITY
Anesthesia: LOCAL

## 2022-09-09 MED ORDER — LIDOCAINE HCL (PF) 1 % IJ SOLN
INTRAMUSCULAR | Status: AC
  Filled 2022-09-09: qty 30

## 2022-09-09 MED ORDER — ONDANSETRON HCL 4 MG/2ML IJ SOLN: 4.0000 mg | Freq: Four times a day (QID) | INTRAMUSCULAR | Status: DC | PRN

## 2022-09-09 MED ORDER — HYDRALAZINE HCL 20 MG/ML IJ SOLN
5.0000 mg | INTRAMUSCULAR | Status: DC | PRN
  Administered 2022-09-09: 5 mg via INTRAVENOUS
  Filled 2022-09-09: qty 1

## 2022-09-09 MED ORDER — ACETAMINOPHEN 325 MG PO TABS: 650.0000 mg | ORAL_TABLET | ORAL | Status: DC | PRN

## 2022-09-09 MED ORDER — OXYCODONE-ACETAMINOPHEN 5-325 MG PO TABS
1.0000 | ORAL_TABLET | Freq: Once | ORAL | Status: AC
  Administered 2022-09-09: 1 via ORAL
  Filled 2022-09-09: qty 1

## 2022-09-09 MED ORDER — SODIUM CHLORIDE 0.9% FLUSH: 3.0000 mL | INTRAVENOUS | Status: DC | PRN

## 2022-09-09 MED ORDER — SODIUM CHLORIDE 0.9% FLUSH: 3.0000 mL | Freq: Two times a day (BID) | INTRAVENOUS | Status: DC

## 2022-09-09 MED ORDER — MIDAZOLAM HCL 2 MG/2ML IJ SOLN
INTRAMUSCULAR | Status: AC
  Filled 2022-09-09: qty 2

## 2022-09-09 MED ORDER — SODIUM CHLORIDE 0.9 % IV SOLN: INTRAVENOUS | Status: DC

## 2022-09-09 MED ORDER — LABETALOL HCL 5 MG/ML IV SOLN: 10.0000 mg | INTRAVENOUS | Status: DC | PRN

## 2022-09-09 MED ORDER — MIDAZOLAM HCL 2 MG/2ML IJ SOLN
INTRAMUSCULAR | Status: DC | PRN
  Administered 2022-09-09: 1 mg via INTRAVENOUS

## 2022-09-09 MED ORDER — LIDOCAINE HCL (PF) 1 % IJ SOLN
INTRAMUSCULAR | Status: DC | PRN
  Administered 2022-09-09: 10 mL

## 2022-09-09 MED ORDER — HEPARIN (PORCINE) IN NACL 1000-0.9 UT/500ML-% IV SOLN
INTRAVENOUS | Status: DC | PRN
  Administered 2022-09-09 (×2): 500 mL

## 2022-09-09 MED ORDER — IODIXANOL 320 MG/ML IV SOLN
INTRAVENOUS | Status: DC | PRN
  Administered 2022-09-09: 75 mL via INTRA_ARTERIAL

## 2022-09-09 MED ORDER — FENTANYL CITRATE (PF) 100 MCG/2ML IJ SOLN
INTRAMUSCULAR | Status: DC | PRN
  Administered 2022-09-09: 25 ug via INTRAVENOUS

## 2022-09-09 MED ORDER — FENTANYL CITRATE (PF) 100 MCG/2ML IJ SOLN
INTRAMUSCULAR | Status: AC
  Filled 2022-09-09: qty 2

## 2022-09-09 MED ORDER — SODIUM CHLORIDE 0.9 % IV SOLN: 250.0000 mL | INTRAVENOUS | Status: DC | PRN

## 2022-09-09 SURGICAL SUPPLY — 14 items
CATH NAVICROSS ST 65CM (CATHETERS) IMPLANT
CATH OMNI FLUSH 5F 65CM (CATHETERS) IMPLANT
CATHETER NAVICROSS ST 65CM (CATHETERS) ×1
DEVICE CLOSURE MYNXGRIP 5F (Vascular Products) IMPLANT
KIT MICROPUNCTURE NIT STIFF (SHEATH) IMPLANT
KIT PV (KITS) ×1 IMPLANT
SHEATH PINNACLE 5F 10CM (SHEATH) IMPLANT
SHEATH PROBE COVER 6X72 (BAG) IMPLANT
STOPCOCK MORSE 400PSI 3WAY (MISCELLANEOUS) IMPLANT
SYR MEDRAD MARK 7 150ML (SYRINGE) ×1 IMPLANT
TRANSDUCER W/STOPCOCK (MISCELLANEOUS) ×1 IMPLANT
TRAY PV CATH (CUSTOM PROCEDURE TRAY) ×1 IMPLANT
TUBING CIL FLEX 10 FLL-RA (TUBING) IMPLANT
WIRE BENTSON .035X145CM (WIRE) IMPLANT

## 2022-09-09 NOTE — H&P (Signed)
History and Physical Interval Note:  09/09/2022 11:43 AM  Benjamin Espinoza  has presented today for surgery, with the diagnosis of accluded bypass graft and rest pain.  The various methods of treatment have been discussed with the patient and family. After consideration of risks, benefits and other options for treatment, the patient has consented to  Procedure(s): ABDOMINAL AORTOGRAM W/LOWER EXTREMITY (N/A) as a surgical intervention.  The patient's history has been reviewed, patient examined, no change in status, stable for surgery.  I have reviewed the patient's chart and labs.  Questions were answered to the patient's satisfaction.    Seen in the [facility] clinic with occluded right lower extremity bypass previously done by Dr. [name].  His symptoms started about 3 months ago.  Likely not a thrombolysis candidate.    Cephus Shelling  Office Note        CC:  follow up Requesting Provider:  Teena Irani, PA-C   HPI: Benjamin Espinoza is a 57 y.o. (Mar 09, 1965) male who presents as a urgent triage for severe right lower extremity pain. He has history of right femoral to below knee popliteal bypass with PTFE by Dr. [name] on 12/25/19. This was for CLR with rest pain. At his last visit in February he was doing well. His duplex showed widely patent bypass. He explains that since his last visit in February he began having pain in the right calf and has now progressed to right foot. He says the pain intensified recently over past couple weeks. He has not been able to bear much weight on the leg or foot. Says great toe is extremely tender to the touch. He is using cane to help ambulate. His pain is keeping him awake at night.  He reports being compliant with his Aspirin, Statin, and Pletal. He does still smoke.        Past Medical History:  Diagnosis Date   Abnormal stress test     Anxiety     Asthma      as a child   Atypical chest pain 05/07/2017   Coronary artery disease     DDD  (degenerative disc disease), lumbar 11/08/2019    Formatting of this note might be different from the original. 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 [place]   Hyperlipidemia     Myocardial infarction [facility]) 2017   PVD (peripheral vascular disease) (HCC) 09/21/2019   Shortness of breath     Tobacco use disorder                 Past Surgical History:  Procedure Laterality Date   ABDOMINAL AORTOGRAM W/LOWER EXTREMITY Bilateral 09/21/2019    Procedure: ABDOMINAL AORTOGRAM W/LOWER EXTREMITY;  Surgeon: [name], MD;  Location[facility] INVASIVE CV LAB;  Service: Cardiovascular;  Laterality: Bilateral;   ABDOMINAL AORTOGRAM W/LOWER EXTREMITY N/A 12/07/2019    Procedure: ABDOMINAL AORTOGRAM W/LOWER EXTREMITY;  Surgeon: [name], MD;  Location: [facility] INVASIVE CV LAB;  Service: Cardiovascular;  Laterality: N/A;   CARDIAC CATHETERIZATION       CORONARY ARTERY BYPASS GRAFT        quadruple bypas 2017   CORONARY STENT INTERVENTION N/A 11/26/2019    Procedure: CORONARY STENT INTERVENTION;  Surgeon: Yvonne Kendall, MD;  Location: [facility] INVASIVE CV LAB;  Service: Cardiovascular;  Laterality: N/A;   FEMORAL-POPLITEAL BYPASS GRAFT Right 12/25/2019    Procedure: BYPASS GRAFT FEMORAL- BELOW KNEE POPLITEAL ARTERY RIGHT USING  GORE PROPATEN VASCULAR GRAFT;  Surgeon: [name], MD;  Location: [facility] OR;  Service: Vascular;  Laterality: Right;   LEFT HEART CATH AND CORS/GRAFTS ANGIOGRAPHY N/A 11/26/2019    Procedure: LEFT HEART CATH AND CORS/GRAFTS ANGIOGRAPHY;  Surgeon: Yvonne Kendall, MD;  Location: [facility] INVASIVE CV LAB;  Service: Cardiovascular;  Laterality: N/A;   LEFT HEART CATH AND CORS/GRAFTS ANGIOGRAPHY N/A 12/01/2020    Procedure: LEFT HEART CATH AND CORS/GRAFTS ANGIOGRAPHY;  Surgeon: Lennette Bihari, MD;  Location: [facility] INVASIVE CV LAB;  Service: Cardiovascular;  Laterality: N/A;   LOWER EXTREMITY ANGIOGRAM Right 12/25/2019    Procedure: RIGHT  LOWER EXTREMITY ANGIOGRAM;  Surgeon: [name], MD;  Location: [facility] OR;  Service: Vascular;  Laterality: Right;   PERIPHERAL VASCULAR BALLOON ANGIOPLASTY Left 12/07/2019    Procedure: PERIPHERAL VASCULAR BALLOON ANGIOPLASTY;  Surgeon: [name], MD;  Location: [facility] INVASIVE CV LAB;  Service: Cardiovascular;  Laterality: Left;  SFA          Social History         Socioeconomic History   Marital status: Married      Spouse name: Not on file   Number of children: Not on file   Years of education: Not on file   Highest education level: Not on file  Occupational History   Not on file  Tobacco Use   Smoking status: Every Day      Years: 25      Types: Cigars, Cigarettes      Passive exposure: Current (Wife is a smoker)   Smokeless tobacco: Never   Tobacco comments:      as of 12/20/19 2 cigararettes/day  Vaping Use   Vaping Use: Never used  Substance and Sexual Activity   Alcohol use: No   Drug use: No   Sexual activity: Not on file  Other Topics Concern   Not on file  Social History Narrative   Not on file    Social Determinants of Health    Financial Resource Strain: Not on file  Food Insecurity: Not on file  Transportation Needs: Not on file  Physical Activity: Not on file  Stress: Not on file  Social Connections: Not on file  Intimate Partner Violence: Not on file           Family History  Problem Relation Age of Onset   Heart disease Mother     Heart disease Father     Heart disease Sister                  Current Outpatient Medications  Medication Sig Dispense Refill   amLODipine (NORVASC) 5 MG tablet TAKE 1 TABLET(5 MG) BY MOUTH DAILY 90 tablet 0   aspirin EC 81 MG tablet Take 2 tablets (162 mg total) by mouth daily. 30 tablet 0   atenolol (TENORMIN) 50 MG tablet Take 50 mg by mouth daily.       atorvastatin (LIPITOR) 80 MG tablet Take 1 tablet (80 mg total) by mouth daily. 90 tablet 3   cilostazol (PLETAL) 100 MG tablet Take 100  mg by mouth 2 (two) times daily.       clopidogrel (PLAVIX) 75 MG tablet TAKE 1 TABLET(75 MG) BY MOUTH DAILY 90 tablet 3   Evolocumab (REPATHA SURECLICK) 140 MG/ML SOAJ Inject the contents of 1 pen (140 mg) under the skin every fourteen (14) days. (Patient not taking: Reported on 04/15/2022) 6 mL 3   gabapentin (NEURONTIN) 600 MG tablet Take 600  mg by mouth 2 (two) times daily as needed (Pain).  (Patient not taking: Reported on 04/15/2022)       losartan (COZAAR) 25 MG tablet TAKE 1 TABLET(25 MG) BY MOUTH DAILY 90 tablet 3   metoprolol succinate (TOPROL XL) 25 MG 24 hr tablet Take 1 tablet (25 mg total) by mouth daily. (Patient not taking: Reported on 04/15/2022) 90 tablet 3   oxyCODONE-acetaminophen (PERCOCET/ROXICET) 5-325 MG tablet Take 1 tablet by mouth every 4 (four) hours as needed for severe pain. 25 tablet 0   pantoprazole (PROTONIX) 40 MG tablet Take 1 tablet (40 mg total) by mouth daily. (Patient not taking: Reported on 04/09/2021) 30 tablet 11   ranolazine (RANEXA) 1000 MG SR tablet Take 1 tablet (1,000 mg total) by mouth 2 (two) times daily. (Patient not taking: Reported on 04/15/2022) 90 tablet 3               Current Facility-Administered Medications  Medication Dose Route Frequency Provider Last Rate Last Admin   sodium chloride flush (NS) 0.9 % injection 3 mL  3 mL Intravenous Q12H Tobb, Kardie, DO            Allergies       Allergies  Allergen Reactions   Imdur [Isosorbide Nitrate] Other (See Comments)      Severe headache          REVIEW OF SYSTEMS:    [X]  denotes positive finding, [ ]  denotes negative finding Cardiac   Comments:  Chest pain or chest pressure:      Shortness of breath upon exertion:      Short of breath when lying flat:      Irregular heart rhythm:             Vascular      Pain in calf, thigh, or hip brought on by ambulation:      Pain in feet at night that wakes you up from your sleep:       Blood clot in your veins:      Leg swelling:               Pulmonary      Oxygen at home:      Productive cough:       Wheezing:              Neurologic      Sudden weakness in arms or legs:       Sudden numbness in arms or legs:       Sudden onset of difficulty speaking or slurred speech:      Temporary loss of vision in one eye:       Problems with dizziness:              Gastrointestinal      Blood in stool:       Vomited blood:              Genitourinary      Burning when urinating:       Blood in urine:             Psychiatric      Major depression:              Hematologic      Bleeding problems:      Problems with blood clotting too easily:             Skin      Rashes or ulcers:  Constitutional      Fever or chills:          PHYSICAL EXAMINATION:      Vitals:    08/26/22 0815  BP: 128/84  Pulse: 68  Resp: 18  Temp: 98.1 F (36.7 C)  TempSrc: Temporal  SpO2: 95%  Weight: 195 lb (88.5 kg)  Height: 6\' 1"  (1.854 m)      General:  WDWN in NAD; vital signs documented above Gait: not observed, transfers  with Cane, using wheel chair HENT: WNL, normocephalic Pulmonary: normal non-labored breathing , without wheezing Cardiac: regular HR Abdomen: soft, NT, no masses Vascular Exam/Pulses: 2+ femoral bilaterally, no palpable distal pulses on RLE. Doppler monophasic PT and doppler popliteal signal on right. Brisk doppler DP/ PT on left Extremities: without ischemic changes, without Gangrene , without cellulitis; without open wounds;  Musculoskeletal: no muscle wasting or atrophy       Neurologic: A&O X 3 Psychiatric:  The pt has Normal affect.     Non-Invasive Vascular Imaging:   +-------+-----------+-----------+------------+------------+  ABI/TBIToday's ABIToday's TBIPrevious ABIPrevious TBI  +-------+-----------+-----------+------------+------------+  Right 0.27       0          0.86        0.81          +-------+-----------+-----------+------------+------------+  Left  0.82        0.42       0.89        0.64          +-------+-----------+-----------+------------+------------+    VAS [place] lower extremity bypass graft duplex:   +-----------+--------+-----+--------+-------------------+--------+  RIGHT     PSV cm/sRatioStenosisWaveform           Comments  +-----------+--------+-----+--------+-------------------+--------+  ATA Distal 4                    continuous                   +-----------+--------+-----+--------+-------------------+--------+  PTA Distal 13                   dampened monophasic          +-----------+--------+-----+--------+-------------------+--------+  PERO Distal13                   dampened monophasic          +-----------+--------+-----+--------+-------------------+--------+     Right Graft #1: Fem- BK pop  +------------------+--------+--------+--------+----------------------+                   PSV cm/sStenosisWaveformComments                +------------------+--------+--------+--------+----------------------+  Inflow           50                      Pre occlusive waveform  +------------------+--------+--------+--------+----------------------+  Prox Anastomosis          occluded                                +------------------+--------+--------+--------+----------------------+  Proximal Graft            occluded                                +------------------+--------+--------+--------+----------------------+  Mid Graft  occluded                                +------------------+--------+--------+--------+----------------------+  Distal Graft              occluded                                +------------------+--------+--------+--------+----------------------+  Distal Anastomosis                        Not visualized          +------------------+--------+--------+--------+----------------------+  Outflow                                   Not visualized          +------------------+--------+--------+--------+----------------------+    Summary:  Right: Occluded fem-BK pop bypass graft. Unable to visualize the distal anastomosis or outflow.      ASSESSMENT/PLAN:: 57 y.o. male here for follow up for PAD. He presents with rest pain on RLE. Not very clear exactly when pain began but has been present for 2-3 months now. Duplex today shows occluded RLE bypass. ABI with significant drop drop from 0.86 to 0.27 and TBI is now absent.  - Since unknown time of the bypass occlusion do not think he is a candidate for thrombolysis - I recommend Angiogram in the near future to determine what targets he has for likely redo bypass  - I provided him with short course of Percocet as his next visit with his PCP is not until 7/5. He takes regularly prescribed narcotic pain medication from PCP but has had to increase his use due to his foot pain so he is out of his pain medication for the month - I will arrange Aortogram, Arteriogram of RLE bypass graft in the near future with Dr. [name]. Will also have our surgery scheduler offer next available Angiogram if patient is okay with this as to expedite his care pending OR availability     Graceann Congress, PA-C [facility] [phone]   Clinic MD:   [name]

## 2022-09-09 NOTE — Progress Notes (Signed)
Saphenous mapping has been completed.   Results can be found under chart review under CV PROC. 09/09/2022 4:18 PM Benjamin Espinoza RVT, RDMS

## 2022-09-09 NOTE — Progress Notes (Signed)
Pt ambulated to and from the bathroom to void with no signs of oozing from left groin site

## 2022-09-09 NOTE — Op Note (Signed)
Patient name: Benjamin Espinoza MRN: [id number] DOB: 03/12/1965 Sex: male  09/09/2022 Pre-operative Diagnosis: Critical limb ischemia with rest pain of the right lower extremity with occluded right common femoral to below-knee popliteal prosthetic bypass Post-operative diagnosis:  Same Surgeon:  Cephus Shelling, MD Procedure Performed: 1.  Ultrasound-guided access left common femoral artery 2.  Aortogram with catheter selection of aorta 3.  Right lower extremity arteriogram with selection of the right common femoral artery 4.  Mynx closure of the left common femoral artery 5.  24 minutes of monitored moderate conscious sedation time  Indications: Patient is a 57 year old male that previously underwent a right common femoral to below-knee popliteal bypass with PTFE on 12/25/2019 with Dr. [name].  Recently seen in the office by the PA with rest pain.  Symptoms  have been ongoing for 3 to 4 months.  Right leg bypass was noted to be occluded in the office.  He presents today for lower extremity angiogram after risks benefits discussed.  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  Both renal arteries were widely patent.  Both iliacs were patent without flow-limiting stenosis with a steep aortic bifurcation..  The right common femoral artery to below-knee popliteal bypass was occluded.  There was a ring around the bypass. There is some mild to moderate right common femoral disease. Dominant runoff is in the profunda with reconstitution of the popliteal artery just above the knee joint.  The below-knee popliteal artery is patent.  Dominant runoff is in the peroneal that looks robust although there is some moderate to severe disease proximally.  Posterior tibial also fills more delayed given there is a moderate to high-grade stenosis proximally in the posterior tibial artery.  After evaluating images we felt a redo right lower extremity bypass would  be his best option.  Will get updated vein mapping.   Procedure:  The patient was identified in the holding area and taken to room 8.  The patient was then placed supine on the table and prepped and draped in the usual sterile fashion.  A time out was called.  Patient received Versed and fentanyl for conscious moderate sedation.  Vital signs were monitored including heart rate, respiratory rate, oxygenation, and blood pressure.  I was present for all of moderate sedation.  Ultrasound was used to evaluate the left common femoral artery.  It was patent .  A digital ultrasound image was acquired.  A micropuncture needle was used to access the left common femoral artery under ultrasound guidance.  An 018 wire was advanced without resistance and a micropuncture sheath was placed.  The 018 wire was removed and a benson wire was placed.  The micropuncture sheath was exchanged for a 5 french sheath.  An omniflush catheter was advanced over the wire to the level of L-1.  An abdominal angiogram was obtained.  Next, using the omniflush catheter and a benson wire, the aortic bifurcation was crossed and the catheter was placed into theright common femoral artery.  I had to use a glidecath and right leg runoff was obtained.  After evaluating images we thought evaluation for redo right lower extremity bypass would be his best option.  Will get updated vein mapping.  Mynx closure device was deployed in the left groin after wires and catheters were removed.  Plan:  After evaluating images we felt a redo right lower extremity bypass would be his best option.  Will get updated vein mapping.  Cephus Shelling, MD [facility] [place] Office: [phone]

## 2022-09-09 NOTE — Discharge Instructions (Signed)
Femoral Site Care This sheet gives you information about how to care for yourself after your procedure. Your health care provider may also give you more specific instructions. If you have problems or questions, contact your health care provider. What can I expect after the procedure?  After the procedure, it is common to have: Bruising that usually fades within 1-2 weeks. Tenderness at the site. Follow these instructions at home: Wound care Follow instructions from your health care provider about how to take care of your insertion site. Make sure you: Remove your dressing at 12 noon 09/10/22 Do not take baths, swim, or use a hot tub until your health care provider approves. You may shower after removing dressing Pat the area dry with a clean towel. Do not rub the site. This may cause bleeding. Do not apply powder or lotion to the site. Keep the site clean and dry. Check your femoral site every day for signs of infection. Check for: Redness, swelling, or pain. Fluid or blood. Warmth. Pus or a bad smell. Activity For the first 2-3 days after your procedure, or as long as directed: Avoid climbing stairs as much as possible. Do not squat. Do not lift anything that is heavier than 10 lb (4.5 kg), or the limit that you are told, until your health care provider says that it is safe. For 5 days Rest as directed. Avoid sitting for a long time without moving. Get up to take short walks every 1-2 hours. Do not drive for 24 hours if you were given a medicine to help you relax (sedative). General instructions Take over-the-counter and prescription medicines only as told by your health care provider. Keep all follow-up visits as told by your health care provider. This is important. Contact a health care provider if you have: A fever or chills. You have redness, swelling, or pain around your insertion site. Get help right away if: The catheter insertion area swells very fast. You pass out. You  suddenly start to sweat or your skin gets clammy. The catheter insertion area is bleeding, and the bleeding does not stop when you hold steady pressure on the area. The area near or just beyond the catheter insertion site becomes pale, cool, tingly, or numb. These symptoms may represent a serious problem that is an emergency. Do not wait to see if the symptoms will go away. Get medical help right away. Call your local emergency services (911 in the [place] Do not drive yourself to the hospital. Summary After the procedure, it is common to have bruising that usually fades within 1-2 weeks. Check your femoral site every day for signs of infection. Do not lift anything that is heavier than 10 lb (4.5 kg), or the limit that you are told, until your health care provider says that it is safe. This information is not intended to replace advice given to you by your health care provider. Make sure you discuss any questions you have with your health care provider. Document Revised: 02/28/2017 Document Reviewed: 02/28/2017 Elsevier Patient Education  2020 [employer].

## 2022-09-10 ENCOUNTER — Other Ambulatory Visit: Payer: Self-pay

## 2022-09-10 ENCOUNTER — Encounter (HOSPITAL_COMMUNITY): Payer: Self-pay | Admitting: Vascular Surgery

## 2022-09-10 ENCOUNTER — Ambulatory Visit
Admit: 2022-09-10 | Discharge: 2022-09-10 | Discharge status: Against Medical Advice | Payer: MEDICARE | Attending: Family Medicine

## 2022-09-10 DIAGNOSIS — Z5321 Procedure and treatment not carried out due to patient leaving prior to being seen by health care provider: Principal | ICD-10-CM

## 2022-09-10 DIAGNOSIS — I70321 Atherosclerosis of unspecified type of bypass graft(s) of the extremities with rest pain, right leg: Secondary | ICD-10-CM

## 2022-09-10 DIAGNOSIS — T82898A Other specified complication of vascular prosthetic devices, implants and grafts, initial encounter: Secondary | ICD-10-CM

## 2022-09-11 ENCOUNTER — Ambulatory Visit
Admit: 2022-09-11 | Discharge: 2022-09-11 | Disposition: A | Discharge status: Home / Self Care | Payer: MEDICARE | Attending: Family Medicine

## 2022-09-11 DIAGNOSIS — K047 Periapical abscess without sinus: Principal | ICD-10-CM

## 2022-09-11 MED ORDER — CLINDAMYCIN HCL 150 MG CAPSULE
ORAL_CAPSULE | Freq: Four times a day (QID) | ORAL | 0 refills | 7 days | Status: CP
Start: 2022-09-11 — End: 2022-09-18

## 2022-09-20 ENCOUNTER — Other Ambulatory Visit: Payer: Self-pay

## 2022-09-20 ENCOUNTER — Telehealth: Payer: Self-pay

## 2022-09-20 ENCOUNTER — Encounter (HOSPITAL_COMMUNITY): Payer: Self-pay | Admitting: Vascular Surgery

## 2022-09-20 NOTE — Anesthesia Preprocedure Evaluation (Addendum)
Anesthesia Evaluation  Patient identified by MRN, date of birth, ID band Patient awake    Reviewed: Allergy & Precautions, NPO status , Patient's Chart, lab work & pertinent test results, reviewed documented beta blocker date and time   Airway Mallampati: II  TM Distance: >3 FB Neck ROM: Full    Dental  (+) Dental Advisory Given   Pulmonary asthma , Current Smoker and Patient abstained from smoking.   breath sounds clear to auscultation       Cardiovascular hypertension, Pt. on medications and Pt. on home beta blockers + CAD, + Past MI, + Cardiac Stents (DES 10/2019), + CABG and + Peripheral Vascular Disease   Rhythm:Regular Rate:Normal  Echo 04/03/19 [facility]): Summary  1. The left ventricle is normal in size with moderately increased wall  thickness.  2. The left ventricular systolic function is normal, LVEF is visually  estimated at > 55%.  3. There is grade I diastolic dysfunction (impaired relaxation).  4. There is mild aortic regurgitation.  5. The aorta at the sinuses of Valsalva is mildly dilated.  6. The right ventricle is normal in size, with normal systolic function.    Neuro/Psych   Anxiety     negative neurological ROS     GI/Hepatic negative GI ROS, Neg liver ROS,,,  Endo/Other  negative endocrine ROS    Renal/GU negative Renal ROS     Musculoskeletal  (+) Arthritis , Osteoarthritis,    Abdominal   Peds  Hematology negative hematology ROS (+)   Anesthesia Other Findings   Reproductive/Obstetrics                             Lab Results  Component Value Date   WBC 6.8 04/09/2021   HGB 15.0 09/09/2022   HCT 44.0 09/09/2022   MCV 92 04/09/2021   PLT 231 04/09/2021   Lab Results  Component Value Date   CREATININE 1.50 (H) 09/09/2022   BUN 19 09/09/2022   NA 143 09/09/2022   K 4.8 09/09/2022   CL 105 09/09/2022   CO2 25 09/09/2022    Anesthesia  Physical Anesthesia Plan  ASA: III  Anesthesia Plan: General   Post-op Pain Management: Dilaudid IV   Induction: Intravenous  PONV Risk Score and Plan: 1 and Treatment may vary due to age or medical condition and Ondansetron  Airway Management Planned: Oral ETT  Additional Equipment: Arterial line  Intra-op Plan:   Post-operative Plan: Extubation in OR  Informed Consent: I have reviewed the patients History and Physical, chart, labs and discussed the procedure including the risks, benefits and alternatives for the proposed anesthesia with the patient or authorized representative who has indicated his/her understanding and acceptance.     Dental advisory given  Plan Discussed with: CRNA  Anesthesia Plan Comments: (PAT note by Antionette Poles, PA-C: 57 year old male for redo right lower extremity bypass with Dr. [name] on 09/21/2022.  Follows with cardiology for history of HTN, HLD, CAD s/p CABG 2017 in [place].  Most recent cath 11/2020 showed, "Compared to his September 2021 study, the vein graft supplying the RCA which was stented ostially and distally is now totally occluded.  There is significant progression of OM1 stenoses beyond the SVG anastomosis and the vessel is very small caliber.  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  Most likely will require increased medical regimen but will review with CTO colleagues regarding potential  attempt at LAD CTO intervention."   He has a history of noncompliance with medication and follow-up.  He was last seen by cardiologist Dr. Servando Salina on 04/09/2021.  At that time it was noted he had not been taking Plavix.  He was advised to restart DAPT with ASA and Plavix.  He had also stopped Repatha.  He was referred back to pharmacy team as well as cardiac rehab.  He was advised to follow-up with cardiology in 6 months, however, he has not been seen since last appointment on 04/09/2021.  Follows with vascular  surgery for history of right femoral to below-knee popliteal bypass with PTFE by Dr. [name] on 12/25/2019.  This was recently found to be occluded and revision was recommended by Dr. Chestine Spore.  Patient was seen in the ED at [facility] on 09/11/2022 for dental abscess.  Treated with antibiotics and stated he had resolution of symptoms.  Hx of CKD III by labs.  Reviewed history with anesthesiologist Dr. Glade Stanford. He advised that given the nature of the surgery, if it is felt to be urgent by Dr. [name], pt can proceed as planned in the absence of cardiac symptoms. He will need DOS labs and evaluation.  Addendum: Pt initially stated he did not have cardiology care outside [facility]. However, he subsequently advised he has been following with cardiologist Dr. Mercy Riding at [facility]. Dr. Adele Dan surgical scheduler reached out to Dr. Mercy Riding who did clear the pt for surgery stating that he is low risk and stable on current medications. Copy of clearance on chart.  EKG 09/09/22: NSR. Rate 65.  Cath 12/01/2020:   Ost LAD to Mid LAD lesion is 100% stenosed.   Prox RCA lesion is 90% stenosed.   Prox RCA to Dist RCA lesion is 100% stenosed.   Dist LAD-1 lesion is 100% stenosed.   Dist LAD-2 lesion is 85% stenosed.   Prox Cx to Mid Cx lesion is 100% stenosed.   1st Mrg-1 lesion is 95% stenosed.   1st Mrg-2 lesion is 95% stenosed.   Origin to Prox Graft lesion is 40% stenosed.   1st Mrg-3 lesion is 95% stenosed.   1st Mrg-4 lesion is 90% stenosed.   Ost LM to Mid LM lesion is 50% stenosed.   Prox Graft lesion is 20% stenosed.   LV end diastolic pressure is normal.  Severe multivessel native coronary artery disease with 50% diffuse left main stenosis, total LAD occlusion at the ostium, high-grade segmental stenoses in the OM1 vessel proximal to and distal to the SVG anastomosis with total occlusion of the AV groove circumflex after the OM1 takeoff and 90% proximal and total occlusion of the  proximal to mid native RCA with evidence for left to right distal collateralization via the left coronary system.  Patent LIMA to LAD with previously noted chronic total occlusion of the LAD moderate segment beyond the anastomosis with small caliber distal LAD vessel after the CTO which is mildly collateralized.  Patent SVG to the left circumflex OM 1 vessel with 40% eccentric proximal graft stenosis.  The OM vessel immediately after the anastomosis is 95% stenosed followed by diffuse 90% stenosis and is a very small caliber distal vessel.  Patent SVG supplying the OM 2 vessel with mild 20% narrowing in the proximal third of the graft.  The AV groove left circumflex fills retrograde to the proximal total occlusion arising distal to the OM1 takeoff.  Occluded saphenous vein graft which was previously stented in September 2021 with total occlusion  of the ostial SVG stent with TIMI 0 flow.  Follow moderate LV dysfunction with EF estimated 40% with mid-distal inferior significant hypocontractility.  LVEDP 15 mmHg.  RECOMMENDATION: Angiograms will be reviewed with colleagues.  Compared to his September 2021 study, the vein graft supplying the RCA which was stented ostially and distally is now totally occluded.  There is significant progression of OM1 stenoses beyond the SVG anastomosis and the vessel is very small caliber.  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  Most likely will require increased medical regimen but will review with CTO colleagues regarding potential attempt at LAD CTO intervention.   TTE 11/29/20: 1. Left ventricular ejection fraction, by estimation, is 50 to 55%. The  left ventricle has low normal function. The left ventricle has no regional  wall motion abnormalities. The left ventricular internal cavity size was  mildly dilated. Left ventricular  diastolic parameters are indeterminate.  2. Right ventricular systolic  function is normal. The right ventricular  size is mildly enlarged. There is normal pulmonary artery systolic  pressure.  3. Left atrial size was mild to moderately dilated.  4. The mitral valve is normal in structure. Mild mitral valve  regurgitation. No evidence of mitral stenosis.  5. The aortic valve is normal in structure. Aortic valve regurgitation is  mild. No aortic stenosis is present.  6. Aortic dilatation noted. There is mild dilatation of the aortic root,  measuring 44 mm. There is mild dilatation of the ascending aorta,  measuring 38 mm.      )        Anesthesia Quick Evaluation

## 2022-09-20 NOTE — Pre-Procedure Instructions (Addendum)
PCP - Maye Hides, PA Cardiologist - Thomasene Ripple, DO  EKG - 09/09/22 ECHO - 11/29/20 Cardiac Cath - 12/01/20  Blood Thinner Instructions: "YOU WILL NEED TO HOLD YOUR PLAVIX AND PLETAL FOR 5 DAYS BEFORE YOUR PROCEDURE. TAKE YOUR LAST DOSE ON 09/15/22."  Last dose: 09/18/20. Called office and left message with the scheduling team Aspirin Instructions: Con't  Pt doesn't have acces to antibacterial soap.   PT had an infected tooth which he was placed on ABX. He has since completed the course with the symptoms resolving.   Anesthesia review: Y  Patient verbally denies any shortness of breath, fever, cough and chest pain during phone call   -------------  SDW INSTRUCTIONS given:  Your procedure is scheduled on Tuesday, July 23rd.  Report to [facility] Main Entrance "A" at 0700 A.M., and check in at the Admitting office.  Call this number if you have problems the morning of surgery:  [phone]   Remember:  Do not eat or drink after midnight the night before your surgery    Take these medicines the morning of surgery with A SIP OF WATER  aspirin  atenolol (TENORMIN)  PERCOCET-if needed  As of today, STOP taking any Aspirin (unless otherwise instructed by your surgeon) Aleve, Naproxen, Ibuprofen, Motrin, Advil, Goody's, BC's, all herbal medications, fish oil, and all vitamins.                      Do not wear jewelry, make up, or nail polish            Do not wear lotions, powders, perfumes/colognes, or deodorant.            Do not shave 48 hours prior to surgery.  Men may shave face and neck.            Do not bring valuables to the hospital.            [facility] is not responsible for any belongings or valuables.  Do NOT Smoke (Tobacco/Vaping) 24 hours prior to your procedure If you use a CPAP at night, you may bring all equipment for your overnight stay.   Contacts, glasses, dentures or bridgework may not be worn into surgery.      For patients admitted to the  hospital, discharge time will be determined by your treatment team.   Patients discharged the day of surgery will not be allowed to drive home, and someone needs to stay with them for 24 hours.    Special instructions:   [facility]- Preparing For Surgery  Before surgery, you can play an important role. Because skin is not sterile, your skin needs to be as free of germs as possible. You can reduce the number of germs on your skin by washing with CHG (chlorahexidine gluconate) Soap before surgery.  CHG is an antiseptic cleaner which kills germs and bonds with the skin to continue killing germs even after washing.    Oral Hygiene is also important to reduce your risk of infection.  Remember - BRUSH YOUR TEETH THE MORNING OF SURGERY WITH YOUR REGULAR TOOTHPASTE  Please do not use if you have an allergy to CHG or antibacterial soaps. If your skin becomes reddened/irritated stop using the CHG.  Do not shave (including legs and underarms) for at least 48 hours prior to first CHG shower. It is OK to shave your face.  Please follow these instructions carefully.   Shower the [employer] [facility] and the  MORNING OF SURGERY with DIAL Soap.   Pat yourself dry with a CLEAN TOWEL.  Wear CLEAN PAJAMAS to bed the night before surgery  Place CLEAN SHEETS on your bed the night of your first shower and DO NOT SLEEP WITH PETS.   Day of Surgery: Please shower morning of surgery  Wear Clean/Comfortable clothing the morning of surgery Do not apply any deodorants/lotions.   Remember to brush your teeth WITH YOUR REGULAR TOOTHPASTE.   Questions were answered. Patient verbalized understanding of instructions.

## 2022-09-20 NOTE — Telephone Encounter (Signed)
Antionette Poles, PA-C with preadmission anesthesia reached out regarding patient's surgery on tomorrow, stating: He's got pretty severe coronary disease with a history of CABG in 2017. Last seen by cardiology March of 2023 and at that time had stopped most of his meds including Plavix. He was advised to restart his meds and followup in 6 months. Unfortunately he didnt do either of those things and hasnt been seen since last March. He was also seen at the ED on 7/13 for a dental abscess and was prescribed antibiotics. He says he did complete them with resolution of symptoms. Inquires if this a case that's going to be deemed urgent and needs to proceed despite risks?   Per Dr. [name], it sounds like he needs preop clearance first.   Contacted patient to inform him of the above information and that a clearance request will be sent to Dr. Mallory Shirk office. Patient hung up the call.

## 2022-09-20 NOTE — Telephone Encounter (Addendum)
Patient called back to advise that he has seen Dr. Mercy Riding, cardiologist at [facility] recently. Advised patient I will fax clearance request to his office and hopefully be able to proceed as scheduled, but will also have to verify with Dr. [name] that he is OK to proceed due to he did not stop taking Pletal as advised. Patient disconnected call.   Spoke with [facility], who advised patient was seen by Dr. Mercy Riding in March and by a PA on 7/17. Clearance faxed to number provided, [phone].   Dr. [name] OK with patient taking Pletal.

## 2022-09-20 NOTE — Progress Notes (Addendum)
Anesthesia Chart Review: Same day workup  57 year old male for redo right lower extremity bypass with Dr. [name] on 09/21/2022.  Follows with cardiology for history of HTN, HLD, CAD s/p CABG 2017 in [place].  Most recent cath 11/2020 showed, "Compared to his September 2021 study, the vein graft supplying the RCA which was stented ostially and distally is now totally occluded.  There is significant progression of OM1 stenoses beyond the SVG anastomosis and the vessel is very small caliber.  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  Most likely will require increased medical regimen but will review with CTO colleagues regarding potential attempt at LAD CTO intervention."   He has a history of noncompliance with medication and follow-up.  He was last seen by cardiologist Dr. Servando Salina on 04/09/2021.  At that time it was noted he had not been taking Plavix.  He was advised to restart DAPT with ASA and Plavix.  He had also stopped Repatha.  He was referred back to pharmacy team as well as cardiac rehab.  He was advised to follow-up with cardiology in 6 months, however, he has not been seen since last appointment on 04/09/2021.  Follows with vascular surgery for history of right femoral to below-knee popliteal bypass with PTFE by Dr. [name] on 12/25/2019.  This was recently found to be occluded and revision was recommended by Dr. Chestine Spore.  Patient was seen in the ED at [facility] on 09/11/2022 for dental abscess.  Treated with antibiotics and stated he had resolution of symptoms.  Hx of CKD III by labs.  Reviewed history with anesthesiologist Dr. Glade Stanford. He advised that given the nature of the surgery, if it is felt to be urgent by Dr. [name], pt can proceed as planned in the absence of cardiac symptoms. He will need DOS labs and evaluation.  Addendum: Pt initially stated he did not have cardiology care outside [facility]. However, he subsequently advised he has been  following with cardiologist Dr. Mercy Riding at [facility]. Dr. Adele Dan surgical scheduler reached out to Dr. Mercy Riding who did clear the pt for surgery stating that he is low risk and stable on current medications. Copy of clearance on chart.  EKG 09/09/22: NSR. Rate 65.  Cath 12/01/2020:   Ost LAD to Mid LAD lesion is 100% stenosed.   Prox RCA lesion is 90% stenosed.   Prox RCA to Dist RCA lesion is 100% stenosed.   Dist LAD-1 lesion is 100% stenosed.   Dist LAD-2 lesion is 85% stenosed.   Prox Cx to Mid Cx lesion is 100% stenosed.   1st Mrg-1 lesion is 95% stenosed.   1st Mrg-2 lesion is 95% stenosed.   Origin to Prox Graft lesion is 40% stenosed.   1st Mrg-3 lesion is 95% stenosed.   1st Mrg-4 lesion is 90% stenosed.   Ost LM to Mid LM lesion is 50% stenosed.   Prox Graft lesion is 20% stenosed.   LV end diastolic pressure is normal.   Severe multivessel native coronary artery disease with 50% diffuse left main stenosis, total LAD occlusion at the ostium, high-grade segmental stenoses in the OM1 vessel proximal to and distal to the SVG anastomosis with total occlusion of the AV groove circumflex after the OM1 takeoff and 90% proximal and total occlusion of the proximal to mid native RCA with evidence for left to right distal collateralization via the left coronary system.   Patent LIMA to LAD with previously noted chronic total  occlusion of the LAD moderate segment beyond the anastomosis with small caliber distal LAD vessel after the CTO which is mildly collateralized.   Patent SVG to the left circumflex OM 1 vessel with 40% eccentric proximal graft stenosis.  The OM vessel immediately after the anastomosis is 95% stenosed followed by diffuse 90% stenosis and is a very small caliber distal vessel.   Patent SVG supplying the OM 2 vessel with mild 20% narrowing in the proximal third of the graft.  The AV groove left circumflex fills retrograde to the proximal total occlusion arising distal to  the OM1 takeoff.   Occluded saphenous vein graft which was previously stented in September 2021 with total occlusion of the ostial SVG stent with TIMI 0 flow.   Follow moderate LV dysfunction with EF estimated 40% with mid-distal inferior significant hypocontractility.  LVEDP 15 mmHg.   RECOMMENDATION: Angiograms will be reviewed with colleagues.  Compared to his September 2021 study, the vein graft supplying the RCA which was stented ostially and distally is now totally occluded.  There is significant progression of OM1 stenoses beyond the SVG anastomosis and the vessel is very small caliber.  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  Most likely will require increased medical regimen but will review with CTO colleagues regarding potential attempt at LAD CTO intervention.   TTE 11/29/20:  1. Left ventricular ejection fraction, by estimation, is 50 to 55%. The  left ventricle has low normal function. The left ventricle has no regional  wall motion abnormalities. The left ventricular internal cavity size was  mildly dilated. Left ventricular  diastolic parameters are indeterminate.   2. Right ventricular systolic function is normal. The right ventricular  size is mildly enlarged. There is normal pulmonary artery systolic  pressure.   3. Left atrial size was mild to moderately dilated.   4. The mitral valve is normal in structure. Mild mitral valve  regurgitation. No evidence of mitral stenosis.   5. The aortic valve is normal in structure. Aortic valve regurgitation is  mild. No aortic stenosis is present.   6. Aortic dilatation noted. There is mild dilatation of the aortic root,  measuring 44 mm. There is mild dilatation of the ascending aorta,  measuring 38 mm.      Zannie Cove [facility] Short Stay Center/Anesthesiology Phone [phone] 09/20/2022 11:35 AM

## 2022-09-20 NOTE — Telephone Encounter (Addendum)
Received fax from [facility] advising patient low risk and cleared for surgery, stable on current Rx. Clearance form faxed to pre-anesthesia department.   Contacted patient and informed of surgical clearance. Rescheduled surgery and reviewed instructions. Patient voiced understanding.

## 2022-09-21 ENCOUNTER — Other Ambulatory Visit: Payer: Self-pay

## 2022-09-21 ENCOUNTER — Inpatient Hospital Stay (HOSPITAL_COMMUNITY): Payer: 59

## 2022-09-21 ENCOUNTER — Inpatient Hospital Stay (HOSPITAL_COMMUNITY): Payer: 59 | Admitting: Physician Assistant

## 2022-09-21 ENCOUNTER — Encounter (HOSPITAL_COMMUNITY)
Admission: RE | Disposition: A | Discharge status: Home Health | Payer: Self-pay | Source: Home / Self Care | Attending: Vascular Surgery

## 2022-09-21 ENCOUNTER — Encounter (HOSPITAL_COMMUNITY): Payer: Self-pay | Admitting: Vascular Surgery

## 2022-09-21 ENCOUNTER — Inpatient Hospital Stay (HOSPITAL_COMMUNITY)
Admission: RE | Admit: 2022-09-21 | Discharge: 2022-09-23 | DRG: 253 | Disposition: A | Discharge status: Home Health | Payer: 59 | Attending: Vascular Surgery | Admitting: Vascular Surgery

## 2022-09-21 DIAGNOSIS — I70521 Atherosclerosis of nonautologous biological bypass graft(s) of the extremities with rest pain, right leg: Secondary | ICD-10-CM

## 2022-09-21 DIAGNOSIS — F17290 Nicotine dependence, other tobacco product, uncomplicated: Secondary | ICD-10-CM | POA: Diagnosis present

## 2022-09-21 DIAGNOSIS — I739 Peripheral vascular disease, unspecified: Principal | ICD-10-CM | POA: Diagnosis present

## 2022-09-21 DIAGNOSIS — Z8249 Family history of ischemic heart disease and other diseases of the circulatory system: Secondary | ICD-10-CM

## 2022-09-21 DIAGNOSIS — Z7902 Long term (current) use of antithrombotics/antiplatelets: Secondary | ICD-10-CM

## 2022-09-21 DIAGNOSIS — F17210 Nicotine dependence, cigarettes, uncomplicated: Secondary | ICD-10-CM | POA: Diagnosis present

## 2022-09-21 DIAGNOSIS — Z91148 Patient's other noncompliance with medication regimen for other reason: Secondary | ICD-10-CM | POA: Diagnosis not present

## 2022-09-21 DIAGNOSIS — Z888 Allergy status to other drugs, medicaments and biological substances status: Secondary | ICD-10-CM | POA: Diagnosis not present

## 2022-09-21 DIAGNOSIS — Z7982 Long term (current) use of aspirin: Secondary | ICD-10-CM

## 2022-09-21 DIAGNOSIS — Z91199 Patient's noncompliance with other medical treatment and regimen due to unspecified reason: Secondary | ICD-10-CM

## 2022-09-21 DIAGNOSIS — J45909 Unspecified asthma, uncomplicated: Secondary | ICD-10-CM | POA: Diagnosis present

## 2022-09-21 DIAGNOSIS — T82898A Other specified complication of vascular prosthetic devices, implants and grafts, initial encounter: Secondary | ICD-10-CM

## 2022-09-21 DIAGNOSIS — E785 Hyperlipidemia, unspecified: Secondary | ICD-10-CM | POA: Diagnosis present

## 2022-09-21 DIAGNOSIS — Z79899 Other long term (current) drug therapy: Secondary | ICD-10-CM | POA: Diagnosis not present

## 2022-09-21 DIAGNOSIS — Z955 Presence of coronary angioplasty implant and graft: Secondary | ICD-10-CM

## 2022-09-21 DIAGNOSIS — I10 Essential (primary) hypertension: Secondary | ICD-10-CM

## 2022-09-21 DIAGNOSIS — I70221 Atherosclerosis of native arteries of extremities with rest pain, right leg: Secondary | ICD-10-CM | POA: Diagnosis present

## 2022-09-21 DIAGNOSIS — Z951 Presence of aortocoronary bypass graft: Secondary | ICD-10-CM

## 2022-09-21 DIAGNOSIS — I252 Old myocardial infarction: Secondary | ICD-10-CM

## 2022-09-21 DIAGNOSIS — I2510 Atherosclerotic heart disease of native coronary artery without angina pectoris: Secondary | ICD-10-CM | POA: Diagnosis present

## 2022-09-21 DIAGNOSIS — I255 Ischemic cardiomyopathy: Secondary | ICD-10-CM | POA: Diagnosis present

## 2022-09-21 DIAGNOSIS — F17200 Nicotine dependence, unspecified, uncomplicated: Secondary | ICD-10-CM | POA: Diagnosis not present

## 2022-09-21 DIAGNOSIS — I70321 Atherosclerosis of unspecified type of bypass graft(s) of the extremities with rest pain, right leg: Secondary | ICD-10-CM

## 2022-09-21 HISTORY — DX: Peripheral vascular disease, unspecified: I73.9

## 2022-09-21 HISTORY — PX: INTRAOPERATIVE ARTERIOGRAM: SHX5157

## 2022-09-21 HISTORY — PX: FEMORAL-POPLITEAL BYPASS GRAFT: SHX937

## 2022-09-21 LAB — COMPREHENSIVE METABOLIC PANEL
ALT: 30 U/L (ref 0–44)
AST: 22 U/L (ref 15–41)
Albumin: 3.6 g/dL (ref 3.5–5.0)
Alkaline Phosphatase: 67 U/L (ref 38–126)
Anion gap: 8 (ref 5–15)
BUN: 8 mg/dL (ref 6–20)
CO2: 21 mmol/L — ABNORMAL LOW (ref 22–32)
Calcium: 9.8 mg/dL (ref 8.9–10.3)
Chloride: 108 mmol/L (ref 98–111)
Creatinine, Ser: 1.06 mg/dL (ref 0.61–1.24)
GFR, Estimated: >60 mL/min (ref >60)
Glucose, Bld: 87 mg/dL (ref 70–99)
Potassium: 3.9 mmol/L (ref 3.5–5.1)
Sodium: 137 mmol/L (ref 135–145)
Total Bilirubin: 0.9 mg/dL (ref 0.3–1.2)
Total Protein: 7.6 g/dL (ref 6.5–8.1)

## 2022-09-21 LAB — TYPE AND SCREEN
ABO/RH(D): O POS
Antibody Screen: NEGATIVE

## 2022-09-21 LAB — CBC
HCT: 46.1 % (ref 39.0–52.0)
Hemoglobin: 14.9 g/dL (ref 13.0–17.0)
MCH: 31.2 pg (ref 26.0–34.0)
MCHC: 32.3 g/dL (ref 30.0–36.0)
MCV: 96.4 fL (ref 80.0–100.0)
Platelets: 350 10*3/uL (ref 150–400)
RBC: 4.78 MIL/uL (ref 4.22–5.81)
RDW: 13.8 % (ref 11.5–15.5)
WBC: 8.6 10*3/uL (ref 4.0–10.5)
nRBC: 0 % (ref 0.0–0.2)

## 2022-09-21 LAB — URINALYSIS, ROUTINE W REFLEX MICROSCOPIC
Bilirubin Urine: NEGATIVE
Glucose, UA: NEGATIVE mg/dL
Hgb urine dipstick: NEGATIVE
Ketones, ur: NEGATIVE mg/dL
Leukocytes,Ua: NEGATIVE
Nitrite: NEGATIVE
Protein, ur: NEGATIVE mg/dL
Specific Gravity, Urine: 1.012 (ref 1.005–1.030)
pH: 5 (ref 5.0–8.0)

## 2022-09-21 LAB — PROTIME-INR
INR: 1 (ref 0.8–1.2)
Prothrombin Time: 13.8 s (ref 11.4–15.2)

## 2022-09-21 LAB — HEPARIN LEVEL (UNFRACTIONATED): Heparin Unfractionated: 0.18 IU/mL — ABNORMAL LOW (ref 0.30–0.70)

## 2022-09-21 LAB — POCT ACTIVATED CLOTTING TIME: Activated Clotting Time: 262 seconds

## 2022-09-21 LAB — APTT: aPTT: 30 seconds (ref 24–36)

## 2022-09-21 SURGERY — BYPASS GRAFT FEMORAL-POPLITEAL ARTERY
Anesthesia: General | Site: Leg Lower | Laterality: Right

## 2022-09-21 SURGERY — BYPASS GRAFT FEMORAL-POPLITEAL ARTERY
Anesthesia: General | Laterality: Right

## 2022-09-21 MED ORDER — LOSARTAN POTASSIUM 25 MG PO TABS
25.0000 mg | ORAL_TABLET | Freq: Every day | ORAL | Status: DC
  Administered 2022-09-22 – 2022-09-23 (×2): 25 mg via ORAL
  Filled 2022-09-21 (×2): qty 1

## 2022-09-21 MED ORDER — METOPROLOL TARTRATE 5 MG/5ML IV SOLN: 2.0000 mg | INTRAVENOUS | Status: DC | PRN

## 2022-09-21 MED ORDER — PROMETHAZINE HCL 25 MG/ML IJ SOLN: 6.2500 mg | INTRAMUSCULAR | Status: DC | PRN

## 2022-09-21 MED ORDER — HEPARIN SODIUM (PORCINE) 1000 UNIT/ML IJ SOLN
INTRAMUSCULAR | Status: DC | PRN
  Administered 2022-09-21: 9000 [IU] via INTRAVENOUS

## 2022-09-21 MED ORDER — FENTANYL CITRATE (PF) 250 MCG/5ML IJ SOLN
INTRAMUSCULAR | Status: AC
  Filled 2022-09-21: qty 5

## 2022-09-21 MED ORDER — PROPOFOL 1000 MG/100ML IV EMUL
INTRAVENOUS | Status: AC
  Filled 2022-09-21: qty 100

## 2022-09-21 MED ORDER — SUGAMMADEX SODIUM 200 MG/2ML IV SOLN
INTRAVENOUS | Status: DC | PRN
  Administered 2022-09-21: 200 mg via INTRAVENOUS

## 2022-09-21 MED ORDER — POTASSIUM CHLORIDE CRYS ER 20 MEQ PO TBCR: 20.0000 meq | EXTENDED_RELEASE_TABLET | Freq: Every day | ORAL | Status: DC | PRN

## 2022-09-21 MED ORDER — PANTOPRAZOLE SODIUM 40 MG PO TBEC
40.0000 mg | DELAYED_RELEASE_TABLET | Freq: Every day | ORAL | Status: DC
  Administered 2022-09-21 – 2022-09-23 (×3): 40 mg via ORAL
  Filled 2022-09-21 (×3): qty 1

## 2022-09-21 MED ORDER — PROPOFOL 10 MG/ML IV BOLUS
INTRAVENOUS | Status: DC | PRN
  Administered 2022-09-21: 120 mg via INTRAVENOUS
  Administered 2022-09-21: 80 mg via INTRAVENOUS

## 2022-09-21 MED ORDER — HEPARIN SODIUM (PORCINE) 1000 UNIT/ML IJ SOLN
INTRAMUSCULAR | Status: AC
  Filled 2022-09-21: qty 10

## 2022-09-21 MED ORDER — ONDANSETRON HCL 4 MG/2ML IJ SOLN
INTRAMUSCULAR | Status: AC
  Filled 2022-09-21: qty 2

## 2022-09-21 MED ORDER — SODIUM CHLORIDE 0.9 % IV SOLN: INTRAVENOUS | Status: DC

## 2022-09-21 MED ORDER — ATENOLOL 25 MG PO TABS
50.0000 mg | ORAL_TABLET | Freq: Every day | ORAL | Status: DC
  Administered 2022-09-21 – 2022-09-23 (×3): 50 mg via ORAL
  Filled 2022-09-21 (×3): qty 2

## 2022-09-21 MED ORDER — DOCUSATE SODIUM 100 MG PO CAPS
100.0000 mg | ORAL_CAPSULE | Freq: Every day | ORAL | Status: DC
  Administered 2022-09-23: 100 mg via ORAL
  Filled 2022-09-21 (×2): qty 1

## 2022-09-21 MED ORDER — HEPARIN 6000 UNIT IRRIGATION SOLUTION
Status: AC
  Filled 2022-09-21: qty 500

## 2022-09-21 MED ORDER — LABETALOL HCL 5 MG/ML IV SOLN
10.0000 mg | INTRAVENOUS | Status: AC | PRN
  Administered 2022-09-21 – 2022-09-22 (×4): 10 mg via INTRAVENOUS
  Filled 2022-09-21 (×4): qty 4

## 2022-09-21 MED ORDER — ROCURONIUM BROMIDE 10 MG/ML (PF) SYRINGE
PREFILLED_SYRINGE | INTRAVENOUS | Status: DC | PRN
  Administered 2022-09-21 (×3): 20 mg via INTRAVENOUS
  Administered 2022-09-21: 90 mg via INTRAVENOUS
  Administered 2022-09-21: 20 mg via INTRAVENOUS

## 2022-09-21 MED ORDER — CHLORHEXIDINE GLUCONATE CLOTH 2 % EX PADS: 6.0000 | MEDICATED_PAD | Freq: Once | CUTANEOUS | Status: DC

## 2022-09-21 MED ORDER — HEPARIN (PORCINE) 25000 UT/250ML-% IV SOLN
500.0000 [IU]/h | INTRAVENOUS | Status: DC
  Filled 2022-09-21: qty 250

## 2022-09-21 MED ORDER — MIDAZOLAM HCL 2 MG/2ML IJ SOLN
INTRAMUSCULAR | Status: AC
  Filled 2022-09-21: qty 2

## 2022-09-21 MED ORDER — ACETAMINOPHEN 325 MG PO TABS
325.0000 mg | ORAL_TABLET | ORAL | Status: DC | PRN
  Administered 2022-09-21 – 2022-09-22 (×5): 650 mg via ORAL
  Filled 2022-09-21 (×6): qty 2

## 2022-09-21 MED ORDER — DEXAMETHASONE SODIUM PHOSPHATE 10 MG/ML IJ SOLN
INTRAMUSCULAR | Status: DC | PRN
  Administered 2022-09-21: 4 mg via INTRAVENOUS

## 2022-09-21 MED ORDER — HYDROMORPHONE HCL 1 MG/ML PO LIQD: 1.0000 mg | ORAL | Status: DC | PRN

## 2022-09-21 MED ORDER — LIDOCAINE 2% (20 MG/ML) 5 ML SYRINGE
INTRAMUSCULAR | Status: AC
  Filled 2022-09-21: qty 5

## 2022-09-21 MED ORDER — MAGNESIUM SULFATE 2 GM/50ML IV SOLN: 2.0000 g | Freq: Every day | INTRAVENOUS | Status: DC | PRN

## 2022-09-21 MED ORDER — HYDROMORPHONE HCL 1 MG/ML IJ SOLN
INTRAMUSCULAR | Status: DC | PRN
  Administered 2022-09-21: .5 mg via INTRAVENOUS

## 2022-09-21 MED ORDER — AMISULPRIDE (ANTIEMETIC) 5 MG/2ML IV SOLN: 10.0000 mg | Freq: Once | INTRAVENOUS | Status: DC | PRN

## 2022-09-21 MED ORDER — HYDROMORPHONE HCL 1 MG/ML IJ SOLN
INTRAMUSCULAR | Status: AC
  Filled 2022-09-21: qty 1

## 2022-09-21 MED ORDER — CEFAZOLIN SODIUM-DEXTROSE 2-4 GM/100ML-% IV SOLN
2.0000 g | Freq: Three times a day (TID) | INTRAVENOUS | Status: AC
  Administered 2022-09-21 – 2022-09-22 (×2): 2 g via INTRAVENOUS
  Filled 2022-09-21 (×2): qty 100

## 2022-09-21 MED ORDER — POLYETHYLENE GLYCOL 3350 17 G PO PACK: 17.0000 g | PACK | Freq: Every day | ORAL | Status: DC | PRN

## 2022-09-21 MED ORDER — OXYCODONE HCL 5 MG PO TABS: 5.0000 mg | ORAL_TABLET | Freq: Once | ORAL | Status: DC | PRN

## 2022-09-21 MED ORDER — CHLORHEXIDINE GLUCONATE 0.12 % MT SOLN
15.0000 mL | Freq: Once | OROMUCOSAL | Status: AC
  Administered 2022-09-21: 15 mL via OROMUCOSAL
  Filled 2022-09-21: qty 15

## 2022-09-21 MED ORDER — OXYCODONE HCL 5 MG PO TABS
5.0000 mg | ORAL_TABLET | ORAL | Status: DC | PRN
  Administered 2022-09-21 – 2022-09-22 (×6): 10 mg via ORAL
  Administered 2022-09-22: 5 mg via ORAL
  Administered 2022-09-23 (×3): 10 mg via ORAL
  Filled 2022-09-21 (×10): qty 2

## 2022-09-21 MED ORDER — LACTATED RINGERS IV SOLN: INTRAVENOUS | Status: DC

## 2022-09-21 MED ORDER — HYDRALAZINE HCL 20 MG/ML IJ SOLN
5.0000 mg | INTRAMUSCULAR | Status: DC | PRN
  Administered 2022-09-21: 5 mg via INTRAVENOUS
  Filled 2022-09-21 (×2): qty 1

## 2022-09-21 MED ORDER — PAPAVERINE HCL 30 MG/ML IJ SOLN
INTRAMUSCULAR | Status: AC
  Filled 2022-09-21: qty 2

## 2022-09-21 MED ORDER — HYDROMORPHONE HCL 1 MG/ML IJ SOLN
0.2500 mg | INTRAMUSCULAR | Status: DC | PRN
  Administered 2022-09-21: 0.5 mg via INTRAVENOUS

## 2022-09-21 MED ORDER — ASPIRIN 81 MG PO TBEC
81.0000 mg | DELAYED_RELEASE_TABLET | Freq: Every day | ORAL | Status: DC
  Administered 2022-09-22 – 2022-09-23 (×2): 81 mg via ORAL
  Filled 2022-09-21 (×2): qty 1

## 2022-09-21 MED ORDER — HEPARIN (PORCINE) 25000 UT/250ML-% IV SOLN: 1350.0000 [IU]/h | INTRAVENOUS | Status: AC

## 2022-09-21 MED ORDER — ORAL CARE MOUTH RINSE: 15.0000 mL | Freq: Once | OROMUCOSAL | Status: AC

## 2022-09-21 MED ORDER — ATORVASTATIN CALCIUM 80 MG PO TABS
80.0000 mg | ORAL_TABLET | Freq: Every day | ORAL | Status: DC
  Administered 2022-09-21 – 2022-09-23 (×3): 80 mg via ORAL
  Filled 2022-09-21 (×3): qty 1

## 2022-09-21 MED ORDER — ROCURONIUM BROMIDE 10 MG/ML (PF) SYRINGE
PREFILLED_SYRINGE | INTRAVENOUS | Status: AC
  Filled 2022-09-21: qty 10

## 2022-09-21 MED ORDER — HYDROMORPHONE HCL 1 MG/ML IJ SOLN
INTRAMUSCULAR | Status: AC
  Filled 2022-09-21: qty 0.5

## 2022-09-21 MED ORDER — GUAIFENESIN-DM 100-10 MG/5ML PO SYRP: 15.0000 mL | ORAL_SOLUTION | ORAL | Status: DC | PRN

## 2022-09-21 MED ORDER — MIDAZOLAM HCL 5 MG/5ML IJ SOLN
INTRAMUSCULAR | Status: DC | PRN
  Administered 2022-09-21: 2 mg via INTRAVENOUS

## 2022-09-21 MED ORDER — MORPHINE SULFATE (PF) 2 MG/ML IV SOLN
2.0000 mg | INTRAVENOUS | Status: DC | PRN
  Administered 2022-09-21 – 2022-09-22 (×4): 2 mg via INTRAVENOUS
  Filled 2022-09-21 (×5): qty 1

## 2022-09-21 MED ORDER — PROPOFOL 10 MG/ML IV BOLUS
INTRAVENOUS | Status: AC
  Filled 2022-09-21: qty 20

## 2022-09-21 MED ORDER — HEPARIN (PORCINE) 25000 UT/250ML-% IV SOLN
500.0000 [IU]/h | INTRAVENOUS | Status: DC
  Administered 2022-09-21: 500 [IU]/h via INTRAVENOUS

## 2022-09-21 MED ORDER — HYDROMORPHONE HCL 2 MG PO TABS
1.0000 mg | ORAL_TABLET | ORAL | Status: DC | PRN
  Administered 2022-09-21 – 2022-09-22 (×2): 1 mg via ORAL
  Filled 2022-09-21 (×2): qty 1

## 2022-09-21 MED ORDER — PHENYLEPHRINE HCL-NACL 20-0.9 MG/250ML-% IV SOLN
INTRAVENOUS | Status: DC | PRN
  Administered 2022-09-21: 25 ug/min via INTRAVENOUS

## 2022-09-21 MED ORDER — SODIUM CHLORIDE 0.9 % IV SOLN: 500.0000 mL | Freq: Once | INTRAVENOUS | Status: DC | PRN

## 2022-09-21 MED ORDER — PROTAMINE SULFATE 10 MG/ML IV SOLN
INTRAVENOUS | Status: DC | PRN
  Administered 2022-09-21: 40 mg via INTRAVENOUS

## 2022-09-21 MED ORDER — CEFAZOLIN SODIUM-DEXTROSE 2-4 GM/100ML-% IV SOLN
2.0000 g | INTRAVENOUS | Status: AC
  Administered 2022-09-21: 2 g via INTRAVENOUS
  Filled 2022-09-21: qty 100

## 2022-09-21 MED ORDER — DEXAMETHASONE SODIUM PHOSPHATE 10 MG/ML IJ SOLN
INTRAMUSCULAR | Status: AC
  Filled 2022-09-21: qty 1

## 2022-09-21 MED ORDER — PROTAMINE SULFATE 10 MG/ML IV SOLN
INTRAVENOUS | Status: AC
  Filled 2022-09-21: qty 25

## 2022-09-21 MED ORDER — ONDANSETRON HCL 4 MG/2ML IJ SOLN
4.0000 mg | Freq: Four times a day (QID) | INTRAMUSCULAR | Status: DC | PRN
  Administered 2022-09-21 – 2022-09-22 (×4): 4 mg via INTRAVENOUS
  Filled 2022-09-21 (×4): qty 2

## 2022-09-21 MED ORDER — ONDANSETRON HCL 4 MG/2ML IJ SOLN
INTRAMUSCULAR | Status: DC | PRN
  Administered 2022-09-21: 4 mg via INTRAVENOUS

## 2022-09-21 MED ORDER — LIDOCAINE 2% (20 MG/ML) 5 ML SYRINGE
INTRAMUSCULAR | Status: DC | PRN
  Administered 2022-09-21: 100 mg via INTRAVENOUS

## 2022-09-21 MED ORDER — FENTANYL CITRATE (PF) 250 MCG/5ML IJ SOLN
INTRAMUSCULAR | Status: DC | PRN
  Administered 2022-09-21 (×2): 50 ug via INTRAVENOUS
  Administered 2022-09-21: 100 ug via INTRAVENOUS
  Administered 2022-09-21: 50 ug via INTRAVENOUS

## 2022-09-21 MED ORDER — HEPARIN 6000 UNIT IRRIGATION SOLUTION
Status: DC | PRN
  Administered 2022-09-21: 1

## 2022-09-21 MED ORDER — PHENOL 1.4 % MT LIQD: 1.0000 | OROMUCOSAL | Status: DC | PRN

## 2022-09-21 MED ORDER — 0.9 % SODIUM CHLORIDE (POUR BTL) OPTIME
TOPICAL | Status: DC | PRN
  Administered 2022-09-21: 2000 mL

## 2022-09-21 MED ORDER — IOHEXOL 300 MG/ML  SOLN
INTRAMUSCULAR | Status: DC | PRN
  Administered 2022-09-21: 100 mL via INTRA_ARTERIAL

## 2022-09-21 MED ORDER — ACETAMINOPHEN 650 MG RE SUPP: 325.0000 mg | RECTAL | Status: DC | PRN

## 2022-09-21 MED ORDER — ALUM & MAG HYDROXIDE-SIMETH 200-200-20 MG/5ML PO SUSP: 15.0000 mL | ORAL | Status: DC | PRN

## 2022-09-21 MED ORDER — OXYCODONE HCL 5 MG/5ML PO SOLN: 5.0000 mg | Freq: Once | ORAL | Status: DC | PRN

## 2022-09-21 MED ORDER — HEMOSTATIC AGENTS (NO CHARGE) OPTIME
TOPICAL | Status: DC | PRN
  Administered 2022-09-21: 1 via TOPICAL

## 2022-09-21 SURGICAL SUPPLY — 66 items
ADH SKN CLS APL DERMABOND .7 (GAUZE/BANDAGES/DRESSINGS) ×6
AGENT HMST 10 BLLW SHRT CANN (HEMOSTASIS) ×2
BAG COUNTER SPONGE SURGICOUNT (BAG) ×2 IMPLANT
BAG SPNG CNTER NS LX DISP (BAG) ×2
BANDAGE ESMARK 6X9 LF (GAUZE/BANDAGES/DRESSINGS) IMPLANT
BIOPATCH RED 1 DISK 7.0 (GAUZE/BANDAGES/DRESSINGS) IMPLANT
BNDG CMPR 5X4 KNIT ELC UNQ LF (GAUZE/BANDAGES/DRESSINGS) ×2
BNDG CMPR 9X6 STRL LF SNTH (GAUZE/BANDAGES/DRESSINGS)
BNDG ELASTIC 4INX 5YD STR LF (GAUZE/BANDAGES/DRESSINGS) IMPLANT
BNDG ESMARK 6X9 LF (GAUZE/BANDAGES/DRESSINGS)
CANISTER SUCT 3000ML PPV (MISCELLANEOUS) ×2 IMPLANT
CANNULA VESSEL 3MM 2 BLNT TIP (CANNULA) ×4 IMPLANT
CLIP TI MEDIUM 24 (CLIP) ×2 IMPLANT
CLIP TI WIDE RED SMALL 24 (CLIP) ×2 IMPLANT
COVER PROBE W GEL 5X96 (DRAPES) IMPLANT
CUFF TOURN SGL QUICK 24 (TOURNIQUET CUFF) ×2
CUFF TOURN SGL QUICK 34 (TOURNIQUET CUFF)
CUFF TOURN SGL QUICK 42 (TOURNIQUET CUFF) IMPLANT
CUFF TRNQT CYL 24X4X16.5-23 (TOURNIQUET CUFF) IMPLANT
CUFF TRNQT CYL 34X4.125X (TOURNIQUET CUFF) IMPLANT
DERMABOND ADVANCED .7 DNX12 (GAUZE/BANDAGES/DRESSINGS) ×2 IMPLANT
DRAIN CHANNEL 15F RND FF W/TCR (WOUND CARE) IMPLANT
DRAPE HALF SHEET 40X57 (DRAPES) IMPLANT
DRAPE X-RAY CASS 24X20 (DRAPES) IMPLANT
DRSG TEGADERM 2-3/8X2-3/4 SM (GAUZE/BANDAGES/DRESSINGS) IMPLANT
ELECT REM PT RETURN 9FT ADLT (ELECTROSURGICAL) ×2
ELECTRODE REM PT RTRN 9FT ADLT (ELECTROSURGICAL) ×2 IMPLANT
EVACUATOR SILICONE 100CC (DRAIN) IMPLANT
GAUZE SPONGE 4X4 12PLY STRL (GAUZE/BANDAGES/DRESSINGS) IMPLANT
GLOVE BIO SURGEON STRL SZ7.5 (GLOVE) ×2 IMPLANT
GLOVE BIOGEL PI IND STRL 8 (GLOVE) ×2 IMPLANT
GLOVE SURG POLY ORTHO LF SZ7.5 (GLOVE) IMPLANT
GLOVE SURG UNDER LTX SZ8 (GLOVE) ×2 IMPLANT
GOWN STRL REUS W/ TWL LRG LVL3 (GOWN DISPOSABLE) ×6 IMPLANT
GOWN STRL REUS W/TWL LRG LVL3 (GOWN DISPOSABLE) ×10
GRAFT PROPATEN W/RING 6X80X60 (Vascular Products) IMPLANT
HEMOSTAT HEMOBLAST BELLOWS (HEMOSTASIS) IMPLANT
INSERT FOGARTY SM (MISCELLANEOUS) IMPLANT
KIT BASIN OR (CUSTOM PROCEDURE TRAY) ×2 IMPLANT
KIT TURNOVER KIT B (KITS) ×2 IMPLANT
LOOP VASCULAR MINI 18 RED (MISCELLANEOUS) ×2
MARKER GRAFT CORONARY BYPASS (MISCELLANEOUS) IMPLANT
NS IRRIG 1000ML POUR BTL (IV SOLUTION) ×4 IMPLANT
PACK PERIPHERAL VASCULAR (CUSTOM PROCEDURE TRAY) ×2 IMPLANT
PAD ARMBOARD 7.5X6 YLW CONV (MISCELLANEOUS) ×4 IMPLANT
PENCIL BUTTON HOLSTER BLD 10FT (ELECTRODE) IMPLANT
SET COLLECT BLD 21X3/4 12 (NEEDLE) IMPLANT
SPONGE SURGIFOAM ABS GEL 100 (HEMOSTASIS) IMPLANT
STOPCOCK 4 WAY LG BORE MALE ST (IV SETS) IMPLANT
SUT ETHILON 3 0 PS 1 (SUTURE) IMPLANT
SUT MNCRL AB 4-0 PS2 18 (SUTURE) ×4 IMPLANT
SUT PROLENE 5 0 C 1 24 (SUTURE) ×2 IMPLANT
SUT PROLENE 6 0 BV (SUTURE) ×2 IMPLANT
SUT SILK 2 0 SH (SUTURE) ×2 IMPLANT
SUT SILK 3 0 (SUTURE)
SUT SILK 3-0 18XBRD TIE 12 (SUTURE) IMPLANT
SUT VIC AB 2-0 CTB1 (SUTURE) ×4 IMPLANT
SUT VIC AB 3-0 SH 27 (SUTURE) ×4
SUT VIC AB 3-0 SH 27X BRD (SUTURE) ×4 IMPLANT
SYR 30ML LL (SYRINGE) IMPLANT
TOWEL GREEN STERILE (TOWEL DISPOSABLE) ×2 IMPLANT
TRAY FOLEY MTR SLVR 16FR STAT (SET/KITS/TRAYS/PACK) ×2 IMPLANT
TUBING EXTENTION W/L.L. (IV SETS) IMPLANT
UNDERPAD 30X36 HEAVY ABSORB (UNDERPADS AND DIAPERS) ×2 IMPLANT
VASCULAR TIE MINI RED 18IN STL (MISCELLANEOUS) IMPLANT
WATER STERILE IRR 1000ML POUR (IV SOLUTION) ×2 IMPLANT

## 2022-09-21 NOTE — Op Note (Signed)
NAME: Benjamin Espinoza    MRN: [id number] DOB: June 25, 1965    DATE OF OPERATION: 09/21/2022  PREOP DIAGNOSIS:    Peripheral arterial disease with rest pain right leg  POSTOP DIAGNOSIS:    Same  PROCEDURE:    Redo exposure of right common femoral artery and right below-knee popliteal artery Right femoral to below-knee popliteal artery bypass with 6 mm PTFE Intraoperative arteriogram  SURGEON: Di Kindle. [name], MD  ASSIST: Kayren Eaves, PA  ANESTHESIA: General  EBL: 150 cc  INDICATIONS:    Benjamin Espinoza is a 57 y.o. male who underwent a right femoral to below-knee popliteal artery bypass with a PTFE graft in 2021.  He did not have vein for an autogenous bypass.  Great saphenous vein had been harvested from both legs.  He presents for redo bypass  FINDINGS:   The below-knee popliteal artery had significant disease.  He had disease in the tibial peroneal trunk but I felt bypass to the below-knee popliteal artery would be better than bypass to the posterior tibial artery given that this would be an all prosthetic bypass.  TECHNIQUE:   The patient was taken to the operating room and the right leg was prepped and draped in usual sterile fashion.  A longitudinal incision was made in the right groin.  Through dense scar tissue, I dissected out the proximal femoropopliteal bypass graft in addition to the common femoral artery proximally in the superficial femoral and deep femoral arteries distally.  There was a good pulse at this level.  Next the incision below the knee was opened.  Again through dense scar tissue I exposed the distal bypass graft at the anastomosis.  There was dense scar tissue and significant adherence to the adjacent veins.  Given that I was able to use a tourniquet I did not dissecting more than I thought was necessary to visualize the below-knee popliteal artery.  A 6 mm, externally ringed PTFE graft was then tunneled between the 2 incisions.  The  patient was heparinized.  Attention was first turned to the proximal anastomosis.  The common femoral artery, deep femoral artery, and superficial femoral arteries were controlled.  The hood of the proximal anastomosis was opened.  There was good inflow at this level.  The 6 mm.  PTFE was spatulated and sewn into side to the common femoral artery using continuous 6-0 Prolene suture.  Prior to completing this anastomosis the graft was clamped.  The inflow was tested and there was excellent inflow.  There is also excellent backbleeding from the deep femoral artery.  Tourniquet was placed on the thigh.  The leg was exsanguinated with an Esmarch bandage.  The tourniquet was inflated to 300 mmHg.  The graft had been pulled to the appropriate length.  Under tourniquet control I removed the distal anastomosis from the below-knee popliteal artery.  There was dense scar tissue and distal exposure of the popliteal artery was difficult.  I placed a 3 mm dilator into the below-knee popliteal artery distally to provide adequate exposure of the distal popliteal artery.  The graft was sewn into side to the artery using continuous 6-0 Prolene suture.  Prior to completing anastomosis the artery was backbled and flushed appropriately and the anastomosis completed.  At this point there was a good signal distal to the bypass with good diastolic flow.  There was a peroneal and posterior tibial signal with the Doppler.  Given the disease noted in the tibial peroneal trunk intraoperative arteriogram was  obtained.  This showed poor visualization distal to the anastomosis however I had passed a 3 mm dilator through this area and I suspect this was spasm.  The heparin was partially reversed with protamine.  Hemostasis was obtained in each of the wounds.  The groin incision was closed with 2 deep layers of 2-0 Vicryl, a subcutaneous layer with 3-0 Vicryl and the skin closed with 4-0 Monocryl.  Dermabond was applied.  The below the  knee incision was closed over a 15 Blake drain.  Was a deep layer of 2-0 Vicryl, a subcutaneous layer with 3-0 Vicryl, and 4-0 Monocryl on the skin.  At the completion there was an excellent posterior tibial signal with a Doppler.  Patient tolerated the procedure well was transferred recovery in stable condition.  All needle and sponge counts were correct.  Given the complexity of the case,  the assistant was necessary in order to expedient the procedure and safely perform the technical aspects of the operation.  The assistant provided traction and countertraction to assist with exposure of the artery proximally and distally.  They assisted with suture ligature of multiple branches.  Their assistance was critical in the performance of both the proximal and distal anastomosis.These skills, especially following the Prolene suture for the anastomosis, could not have been adequately performed by a scrub tech assistant.    [name], MD, FACS [facility] [place]  DATE OF DICTATION:   09/21/2022

## 2022-09-21 NOTE — Anesthesia Procedure Notes (Signed)
Procedure Name: Intubation Date/Time: 09/21/2022 10:03 AM  Performed by: Demetrio Lapping, CRNAPre-anesthesia Checklist: Patient identified, Emergency Drugs available, Suction available and Patient being monitored Patient Re-evaluated:Patient Re-evaluated prior to induction Oxygen Delivery Method: Circle System Utilized Preoxygenation: Pre-oxygenation with 100% oxygen Induction Type: IV induction Ventilation: Mask ventilation without difficulty and Oral airway inserted - appropriate to patient size Laryngoscope Size: Mac and 4 Grade View: Grade III Tube type: Oral Tube size: 7.5 mm Number of attempts: 2 (limited mouth opening; also faulty light on laryngoscope on 1st look) Airway Equipment and Method: Stylet Placement Confirmation: ETT inserted through vocal cords under direct vision, positive ETCO2 and breath sounds checked- equal and bilateral Secured at: 22 cm Tube secured with: Tape Dental Injury: Teeth and Oropharynx as per pre-operative assessment

## 2022-09-21 NOTE — Anesthesia Procedure Notes (Signed)
Arterial Line Insertion Start/End7/23/2024 9:05 AM, 09/21/2022 9:10 AM Performed by: Waynard Edwards, CRNA, CRNA  Patient location: Pre-op. Preanesthetic checklist: patient identified, IV checked, site marked, risks and benefits discussed, surgical consent, monitors and equipment checked, pre-op evaluation, timeout performed and anesthesia consent Lidocaine 1% used for infiltration Left, radial was placed Catheter size: 20 G Hand hygiene performed , maximum sterile barriers used  and Seldinger technique used Allen's test indicative of satisfactory collateral circulation Attempts: 1 Procedure performed without using ultrasound guided technique. Following insertion, dressing applied and Biopatch. Post procedure assessment: normal and unchanged  Patient tolerated the procedure well with no immediate complications.

## 2022-09-21 NOTE — Transfer of Care (Signed)
Immediate Anesthesia Transfer of Care Note  Patient: Benjamin Espinoza  Procedure(s) Performed: REDO RIGHT LOWER EXTREMITY BYPASS USING GORE PROPATEN 6mm REMOVALBLE RING GRAFT (Right) INTRA OPERATIVE ARTERIOGRAM (Right: Leg Lower)  Patient Location: PACU  Anesthesia Type:General  Level of Consciousness: awake and patient cooperative  Airway & Oxygen Therapy: Patient Spontanous Breathing and Patient connected to face mask oxygen  Post-op Assessment: Report given to RN and Post -op Vital signs reviewed and stable  Post vital signs: Reviewed and stable  Last Vitals:  Vitals Value Taken Time  BP 167/96 09/21/22 1400  Temp    Pulse 92 09/21/22 1401  Resp 18 09/21/22 1401  SpO2 98 % 09/21/22 1401  Vitals shown include unfiled device data.  Last Pain:  Vitals:   09/21/22 0724  TempSrc:   PainSc: 10-Worst pain ever         Complications: No notable events documented.

## 2022-09-21 NOTE — H&P (Signed)
Patient seen and examined in preop holding.  No complaints. No changes to medication history or physical exam since last seen. Now s/p angiography demonstrating need for repeat bypass surgery. Poor conduit with disadvantaged target. I think he will be best served with repeat BK pop over peroneal artery bypass as the BK pop will provide more outflow. The stenosis is appreciated distally, however fem tibs with PTFE have poor patency rates. Will assess intra-op. After discussing the risks and benefits of Redo bypass surgery with myself and Dr. [name], Benjamin Espinoza elected to proceed.   Victorino Sparrow MD   Office Note        CC:  follow up Requesting Provider:  Teena Irani, PA-C   HPI: Benjamin Espinoza is a 57 y.o. (Oct 16, 1965) male who presents as a urgent triage for severe right lower extremity pain. He has history of right femoral to below knee popliteal bypass with PTFE by Dr. [name] on 12/25/19. This was for CLR with rest pain. At his last visit in February he was doing well. His duplex showed widely patent bypass. He explains that since his last visit in February he began having pain in the right calf and has now progressed to right foot. He says the pain intensified recently over past couple weeks. He has not been able to bear much weight on the leg or foot. Says great toe is extremely tender to the touch. He is using cane to help ambulate. His pain is keeping him awake at night.  He reports being compliant with his Aspirin, Statin, and Pletal. He does still smoke.        Past Medical History:  Diagnosis Date   Abnormal stress test     Anxiety     Asthma      as a child   Atypical chest pain 05/07/2017   Coronary artery disease     DDD (degenerative disc disease), lumbar 11/08/2019    Formatting of this note might be different from the original. 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 [place]   Hyperlipidemia     Myocardial infarction  [facility]) 2017   PVD (peripheral vascular disease) (HCC) 09/21/2019   Shortness of breath     Tobacco use disorder                 Past Surgical History:  Procedure Laterality Date   ABDOMINAL AORTOGRAM W/LOWER EXTREMITY Bilateral 09/21/2019    Procedure: ABDOMINAL AORTOGRAM W/LOWER EXTREMITY;  Surgeon: [name], MD;  Location: [facility] INVASIVE CV LAB;  Service: Cardiovascular;  Laterality: Bilateral;   ABDOMINAL AORTOGRAM W/LOWER EXTREMITY N/A 12/07/2019    Procedure: ABDOMINAL AORTOGRAM W/LOWER EXTREMITY;  Surgeon: [name], MD;  Location: [facility] INVASIVE CV LAB;  Service: Cardiovascular;  Laterality: N/A;   CARDIAC CATHETERIZATION       CORONARY ARTERY BYPASS GRAFT        quadruple bypas 2017   CORONARY STENT INTERVENTION N/A 11/26/2019    Procedure: CORONARY STENT INTERVENTION;  Surgeon: Yvonne Kendall, MD;  Location: [facility] INVASIVE CV LAB;  Service: Cardiovascular;  Laterality: N/A;   FEMORAL-POPLITEAL BYPASS GRAFT Right 12/25/2019    Procedure: BYPASS GRAFT FEMORAL- BELOW KNEE POPLITEAL ARTERY RIGHT USING GORE PROPATEN VASCULAR GRAFT;  Surgeon: [name], MD;  Location: [facility] OR;  Service: Vascular;  Laterality: Right;   LEFT HEART CATH AND CORS/GRAFTS ANGIOGRAPHY N/A 11/26/2019    Procedure: LEFT HEART CATH AND  CORS/GRAFTS ANGIOGRAPHY;  Surgeon: Yvonne Kendall, MD;  Location: [facility] INVASIVE CV LAB;  Service: Cardiovascular;  Laterality: N/A;   LEFT HEART CATH AND CORS/GRAFTS ANGIOGRAPHY N/A 12/01/2020    Procedure: LEFT HEART CATH AND CORS/GRAFTS ANGIOGRAPHY;  Surgeon: Lennette Bihari, MD;  Location: [facility] INVASIVE CV LAB;  Service: Cardiovascular;  Laterality: N/A;   LOWER EXTREMITY ANGIOGRAM Right 12/25/2019    Procedure: RIGHT LOWER EXTREMITY ANGIOGRAM;  Surgeon: [name], MD;  Location: [facility] OR;  Service: Vascular;  Laterality: Right;   PERIPHERAL VASCULAR BALLOON ANGIOPLASTY Left 12/07/2019    Procedure: PERIPHERAL VASCULAR BALLOON ANGIOPLASTY;   Surgeon: [name], MD;  Location: [facility] INVASIVE CV LAB;  Service: Cardiovascular;  Laterality: Left;  SFA          Social History         Socioeconomic History   Marital status: Married      Spouse name: Not on file   Number of children: Not on file   Years of education: Not on file   Highest education level: Not on file  Occupational History   Not on file  Tobacco Use   Smoking status: Every Day      Years: 25      Types: Cigars, Cigarettes      Passive exposure: Current (Wife is a smoker)   Smokeless tobacco: Never   Tobacco comments:      as of 12/20/19 2 cigararettes/day  Vaping Use   Vaping Use: Never used  Substance and Sexual Activity   Alcohol use: No   Drug use: No   Sexual activity: Not on file  Other Topics Concern   Not on file  Social History Narrative   Not on file    Social Determinants of Health    Financial Resource Strain: Not on file  Food Insecurity: Not on file  Transportation Needs: Not on file  Physical Activity: Not on file  Stress: Not on file  Social Connections: Not on file  Intimate Partner Violence: Not on file           Family History  Problem Relation Age of Onset   Heart disease Mother     Heart disease Father     Heart disease Sister                  Current Outpatient Medications  Medication Sig Dispense Refill   amLODipine (NORVASC) 5 MG tablet TAKE 1 TABLET(5 MG) BY MOUTH DAILY 90 tablet 0   aspirin EC 81 MG tablet Take 2 tablets (162 mg total) by mouth daily. 30 tablet 0   atenolol (TENORMIN) 50 MG tablet Take 50 mg by mouth daily.       atorvastatin (LIPITOR) 80 MG tablet Take 1 tablet (80 mg total) by mouth daily. 90 tablet 3   cilostazol (PLETAL) 100 MG tablet Take 100 mg by mouth 2 (two) times daily.       clopidogrel (PLAVIX) 75 MG tablet TAKE 1 TABLET(75 MG) BY MOUTH DAILY 90 tablet 3   Evolocumab (REPATHA SURECLICK) 140 MG/ML SOAJ Inject the contents of 1 pen (140 mg) under the skin every fourteen  (14) days. (Patient not taking: Reported on 04/15/2022) 6 mL 3   gabapentin (NEURONTIN) 600 MG tablet Take 600 mg by mouth 2 (two) times daily as needed (Pain).  (Patient not taking: Reported on 04/15/2022)       losartan (COZAAR) 25 MG tablet TAKE 1 TABLET(25 MG) BY MOUTH DAILY 90 tablet 3  metoprolol succinate (TOPROL XL) 25 MG 24 hr tablet Take 1 tablet (25 mg total) by mouth daily. (Patient not taking: Reported on 04/15/2022) 90 tablet 3   oxyCODONE-acetaminophen (PERCOCET/ROXICET) 5-325 MG tablet Take 1 tablet by mouth every 4 (four) hours as needed for severe pain. 25 tablet 0   pantoprazole (PROTONIX) 40 MG tablet Take 1 tablet (40 mg total) by mouth daily. (Patient not taking: Reported on 04/09/2021) 30 tablet 11   ranolazine (RANEXA) 1000 MG SR tablet Take 1 tablet (1,000 mg total) by mouth 2 (two) times daily. (Patient not taking: Reported on 04/15/2022) 90 tablet 3               Current Facility-Administered Medications  Medication Dose Route Frequency Provider Last Rate Last Admin   sodium chloride flush (NS) 0.9 % injection 3 mL  3 mL Intravenous Q12H Tobb, Kardie, DO            Allergies       Allergies  Allergen Reactions   Imdur [Isosorbide Nitrate] Other (See Comments)      Severe headache          REVIEW OF SYSTEMS:    [X]  denotes positive finding, [ ]  denotes negative finding Cardiac   Comments:  Chest pain or chest pressure:      Shortness of breath upon exertion:      Short of breath when lying flat:      Irregular heart rhythm:             Vascular      Pain in calf, thigh, or hip brought on by ambulation:      Pain in feet at night that wakes you up from your sleep:       Blood clot in your veins:      Leg swelling:              Pulmonary      Oxygen at home:      Productive cough:       Wheezing:              Neurologic      Sudden weakness in arms or legs:       Sudden numbness in arms or legs:       Sudden onset of difficulty speaking or slurred  speech:      Temporary loss of vision in one eye:       Problems with dizziness:              Gastrointestinal      Blood in stool:       Vomited blood:              Genitourinary      Burning when urinating:       Blood in urine:             Psychiatric      Major depression:              Hematologic      Bleeding problems:      Problems with blood clotting too easily:             Skin      Rashes or ulcers:             Constitutional      Fever or chills:          PHYSICAL EXAMINATION:      Vitals:    08/26/22 0815  BP: 128/84  Pulse: 68  Resp: 18  Temp: 98.1 F (36.7 C)  TempSrc: Temporal  SpO2: 95%  Weight: 195 lb (88.5 kg)  Height: 6\' 1"  (1.854 m)      General:  WDWN in NAD; vital signs documented above Gait: not observed, transfers  with Cane, using wheel chair HENT: WNL, normocephalic Pulmonary: normal non-labored breathing , without wheezing Cardiac: regular HR Abdomen: soft, NT, no masses Vascular Exam/Pulses: 2+ femoral bilaterally, no palpable distal pulses on RLE. Doppler monophasic PT and doppler popliteal signal on right. Brisk doppler DP/ PT on left Extremities: without ischemic changes, without Gangrene , without cellulitis; without open wounds;  Musculoskeletal: no muscle wasting or atrophy       Neurologic: A&O X 3 Psychiatric:  The pt has Normal affect.     Non-Invasive Vascular Imaging:   +-------+-----------+-----------+------------+------------+  ABI/TBIToday's ABIToday's TBIPrevious ABIPrevious TBI  +-------+-----------+-----------+------------+------------+  Right 0.27       0          0.86        0.81          +-------+-----------+-----------+------------+------------+  Left  0.82       0.42       0.89        0.64          +-------+-----------+-----------+------------+------------+    VAS [place] lower extremity bypass graft duplex:   +-----------+--------+-----+--------+-------------------+--------+  RIGHT      PSV cm/sRatioStenosisWaveform           Comments  +-----------+--------+-----+--------+-------------------+--------+  ATA Distal 4                    continuous                   +-----------+--------+-----+--------+-------------------+--------+  PTA Distal 13                   dampened monophasic          +-----------+--------+-----+--------+-------------------+--------+  PERO Distal13                   dampened monophasic          +-----------+--------+-----+--------+-------------------+--------+     Right Graft #1: Fem- BK pop  +------------------+--------+--------+--------+----------------------+                   PSV cm/sStenosisWaveformComments                +------------------+--------+--------+--------+----------------------+  Inflow           50                      Pre occlusive waveform  +------------------+--------+--------+--------+----------------------+  Prox Anastomosis          occluded                                +------------------+--------+--------+--------+----------------------+  Proximal Graft            occluded                                +------------------+--------+--------+--------+----------------------+  Mid Graft                 occluded                                +------------------+--------+--------+--------+----------------------+  Distal Graft              occluded                                +------------------+--------+--------+--------+----------------------+  Distal Anastomosis                        Not visualized          +------------------+--------+--------+--------+----------------------+  Outflow                                  Not visualized          +------------------+--------+--------+--------+----------------------+    Summary:  Right: Occluded fem-BK pop bypass graft. Unable to visualize the distal anastomosis or outflow.      ASSESSMENT/PLAN::  57 y.o. male here for follow up for PAD. He presents with rest pain on RLE. Not very clear exactly when pain began but has been present for 2-3 months now. Duplex today shows occluded RLE bypass. ABI with significant drop drop from 0.86 to 0.27 and TBI is now absent.  - Since unknown time of the bypass occlusion do not think he is a candidate for thrombolysis - I recommend Angiogram in the near future to determine what targets he has for likely redo bypass  - I provided him with short course of Percocet as his next visit with his PCP is not until 7/5. He takes regularly prescribed narcotic pain medication from PCP but has had to increase his use due to his foot pain so he is out of his pain medication for the month - I will arrange Aortogram, Arteriogram of RLE bypass graft in the near future with Dr. [name]. Will also have our surgery scheduler offer next available Angiogram if patient is okay with this as to expedite his care pending OR availability     Graceann Congress, PA-C [facility] [phone]   Clinic MD:   [name]

## 2022-09-21 NOTE — Progress Notes (Signed)
ANTICOAGULATION CONSULT NOTE - Follow Up Consult  Pharmacy Consult for Heparin Indication:  Bypass graft patency  Allergies  Allergen Reactions   Imdur [Isosorbide Nitrate] Other (See Comments)    Severe headache    Patient Measurements: Height: 5\' 10"  (177.8 cm) Weight: 92.4 kg (203 lb 10.1 oz) IBW/kg (Calculated) : 73 Heparin Dosing Weight: 91.6  Vital Signs: Temp: 98.4 F (36.9 C) (07/23 2000) Temp Source: Oral (07/23 2000) BP: 159/99 (07/23 2000) Pulse Rate: 80 (07/23 2034)  Labs: Recent Labs    09/21/22 0802 09/21/22 1542  HGB 14.9  --   HCT 46.1  --   PLT 350  --   APTT 30  --   LABPROT 13.8  --   INR 1.0  --   HEPARINUNFRC  --  0.18*  CREATININE 1.06  --     Estimated Creatinine Clearance: 88.9 mL/min (by C-G formula based on SCr of 1.06 mg/dL).   Medical History: Past Medical History:  Diagnosis Date   Abnormal stress test    Anxiety    Asthma    as a child   Atypical chest pain 05/07/2017   Coronary artery disease    DDD (degenerative disc disease), lumbar 11/08/2019   Formatting of this note might be different from the original. 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 [place]   Hyperlipidemia    Myocardial infarction [facility]) 2017   PVD (peripheral vascular disease) (HCC) 09/21/2019   Shortness of breath    Tobacco use disorder     Medications:  Scheduled:   [START ON 09/22/2022] aspirin EC  81 mg Oral Q0600   atenolol  50 mg Oral Daily   atorvastatin  80 mg Oral Daily   [START ON 09/22/2022] docusate sodium  100 mg Oral Daily   HYDROmorphone       [START ON 09/22/2022] losartan  25 mg Oral Daily   pantoprazole  40 mg Oral Daily   Infusions:   sodium chloride     sodium chloride 75 mL/hr at 09/21/22 1615    ceFAZolin (ANCEF) IV 2 g (09/21/22 1744)   heparin 500 Units/hr (09/21/22 1524)   magnesium sulfate bolus IVPB      Assessment: 56YOM being initiated on heparin therapy for bypass graft patency. During  below-knee popliteal bypass, patient received 9000 unit heparin bolus along with 40mg  of protamine.  Heparin level 0.18 on heparin 500 units/hr. No bleeding issues reported per discussion with RN. Ok to increase to full dose now per VSS.  Goal of Therapy:  Heparin level 0.3-0.7 units/ml Monitor platelets by anticoagulation protocol: Yes   Plan:  Increase heparin infusion to 1100 units/hr. Check 6hr HL  Daily HL and CBC Continue to monitor H&H and platelets  Loralee Pacas, PharmD, BCPS 09/21/2022 10:17 PM  Please check AMION for all [facility] Pharmacy phone numbers After 10:00 PM, call Main Pharmacy [phone]

## 2022-09-21 NOTE — Progress Notes (Signed)
ANTICOAGULATION CONSULT NOTE - Initial Consult  Pharmacy Consult for Heparin Indication:  Bypass graft patency  Allergies  Allergen Reactions   Imdur [Isosorbide Nitrate] Other (See Comments)    Severe headache    Patient Measurements: Height: 5\' 10"  (177.8 cm) Weight: 92.4 kg (203 lb 10.1 oz) IBW/kg (Calculated) : 73 Heparin Dosing Weight: 91.6  Vital Signs: Temp: 98.1 F (36.7 C) (07/23 1505) Temp Source: Oral (07/23 1505) BP: 143/100 (07/23 1445) Pulse Rate: 83 (07/23 1445)  Labs: Recent Labs    09/21/22 0802  HGB 14.9  HCT 46.1  PLT 350  APTT 30  LABPROT 13.8  INR 1.0  CREATININE 1.06    Estimated Creatinine Clearance: 88.9 mL/min (by C-G formula based on SCr of 1.06 mg/dL).   Medical History: Past Medical History:  Diagnosis Date   Abnormal stress test    Anxiety    Asthma    as a child   Atypical chest pain 05/07/2017   Coronary artery disease    DDD (degenerative disc disease), lumbar 11/08/2019   Formatting of this note might be different from the original. 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 [place]   Hyperlipidemia    Myocardial infarction [facility]) 2017   PVD (peripheral vascular disease) (HCC) 09/21/2019   Shortness of breath    Tobacco use disorder     Medications:  Scheduled:   [START ON 09/22/2022] aspirin EC  81 mg Oral Q0600   atenolol  50 mg Oral Daily   atorvastatin  80 mg Oral Daily   [START ON 09/22/2022] docusate sodium  100 mg Oral Daily   HYDROmorphone       [START ON 09/22/2022] losartan  25 mg Oral Daily   pantoprazole  40 mg Oral Daily   Infusions:   sodium chloride     sodium chloride      ceFAZolin (ANCEF) IV     heparin     magnesium sulfate bolus IVPB      Assessment: Benjamin Espinoza being initiated on heparin therapy for bypass graft patency. During below-knee popliteal bypass, patient received 9000 unit heparin bolus along with 40mg  of protamine.  Hgb 14.9, plt 350.  Goal of Therapy:   Heparin level 0.3-0.7 units/ml Monitor platelets by anticoagulation protocol: Yes   Plan:  Start heparin infusion at 500 units/hr until 2200, then increase to full dose. Check 6hr HL at 2200. Continue to monitor H&H and platelets  Nicole Kindred, PharmD PGY1 Pharmacy Resident 09/21/2022 3:22 PM

## 2022-09-22 ENCOUNTER — Encounter (HOSPITAL_COMMUNITY): Payer: Self-pay | Admitting: Vascular Surgery

## 2022-09-22 ENCOUNTER — Other Ambulatory Visit (HOSPITAL_COMMUNITY): Payer: Self-pay

## 2022-09-22 LAB — BASIC METABOLIC PANEL
Anion gap: 7 (ref 5–15)
BUN: 11 mg/dL (ref 6–20)
CO2: 26 mmol/L (ref 22–32)
Calcium: 9.1 mg/dL (ref 8.9–10.3)
Chloride: 104 mmol/L (ref 98–111)
Creatinine, Ser: 1.14 mg/dL (ref 0.61–1.24)
GFR, Estimated: >60 mL/min (ref >60)
Glucose, Bld: 112 mg/dL — ABNORMAL HIGH (ref 70–99)
Potassium: 3.5 mmol/L (ref 3.5–5.1)
Sodium: 137 mmol/L (ref 135–145)

## 2022-09-22 LAB — LIPID PANEL
Cholesterol: 164 mg/dL (ref 0–200)
HDL: 37 mg/dL — ABNORMAL LOW (ref >40)
LDL Cholesterol: 114 mg/dL — ABNORMAL HIGH (ref 0–99)
Total CHOL/HDL Ratio: 4.4 RATIO
Triglycerides: 66 mg/dL (ref <150)
VLDL: 13 mg/dL (ref 0–40)

## 2022-09-22 LAB — CBC
HCT: 35.6 % — ABNORMAL LOW (ref 39.0–52.0)
Hemoglobin: 12.5 g/dL — ABNORMAL LOW (ref 13.0–17.0)
MCH: 32.5 pg (ref 26.0–34.0)
MCHC: 35.1 g/dL (ref 30.0–36.0)
MCV: 92.5 fL (ref 80.0–100.0)
Platelets: 331 10*3/uL (ref 150–400)
RBC: 3.85 MIL/uL — ABNORMAL LOW (ref 4.22–5.81)
RDW: 13.6 % (ref 11.5–15.5)
WBC: 10.7 10*3/uL — ABNORMAL HIGH (ref 4.0–10.5)
nRBC: 0 % (ref 0.0–0.2)

## 2022-09-22 LAB — HEPARIN LEVEL (UNFRACTIONATED): Heparin Unfractionated: 0.19 IU/mL — ABNORMAL LOW (ref 0.30–0.70)

## 2022-09-22 MED ORDER — RIVAROXABAN 20 MG PO TABS
20.0000 mg | ORAL_TABLET | Freq: Every day | ORAL | Status: DC
  Administered 2022-09-22: 20 mg via ORAL
  Filled 2022-09-22: qty 1

## 2022-09-22 NOTE — Progress Notes (Addendum)
  Progress Note   09/22/2022 6:42 AM 1 Day Post-Op  Subjective:  says his foot feels better.  Wants to go home.    Afebrile HR 60's-80's NSR 120's-170's systolic 97% RA  Vitals:   09/22/22 0300 09/22/22 0400  BP: 128/80 130/88  Pulse: 79 74  Resp: 20 11  Temp:  98.4 F (36.9 C)  SpO2: 97% 98%    Physical Exam: General:  no distress Cardiac:  regular Lungs:  non labored Incisions:  right groin and below knee incisions look good Extremities:  brisk right PT and DP doppler signals.  Right calf is soft Abdomen:  soft  CBC    Component Value Date/Time   WBC 10.7 (H) 09/22/2022 0500   RBC 3.85 (L) 09/22/2022 0500   HGB 12.5 (L) 09/22/2022 0500   HGB 14.2 04/09/2021 0904   HCT 35.6 (L) 09/22/2022 0500   HCT 41.6 04/09/2021 0904   PLT 331 09/22/2022 0500   PLT 231 04/09/2021 0904   MCV 92.5 09/22/2022 0500   MCV 92 04/09/2021 0904   MCH 32.5 09/22/2022 0500   MCHC 35.1 09/22/2022 0500   RDW 13.6 09/22/2022 0500   RDW 13.1 04/09/2021 0904   LYMPHSABS 2.1 04/09/2021 0904   EOSABS 0.1 04/09/2021 0904   BASOSABS 0.1 04/09/2021 0904    BMET    Component Value Date/Time   NA 137 09/22/2022 0500   NA 141 04/09/2021 0904   K 3.5 09/22/2022 0500   CL 104 09/22/2022 0500   CO2 26 09/22/2022 0500   GLUCOSE 112 (H) 09/22/2022 0500   BUN 11 09/22/2022 0500   BUN 13 04/09/2021 0904   CREATININE 1.14 09/22/2022 0500   CALCIUM 9.1 09/22/2022 0500   GFRNONAA >60 09/22/2022 0500   GFRAA 99 11/13/2019 1359    INR    Component Value Date/Time   INR 1.0 09/21/2022 0802     Intake/Output Summary (Last 24 hours) at 09/22/2022 8413 Last data filed at 09/22/2022 0500 Gross per 24 hour  Intake 2953.36 ml  Output 1606 ml  Net 1347.36 ml      Assessment/Plan:  57 y.o. male is s/p:  Redo exposure right CFA and BK popliteal artery with right femoral to BK popliteal bypass with PTFE and JP drain placement  1 Day Post-Op   -pt with brisk doppler flow right PT/DP.   His right calf is soft and non tender -JP drain with 30cc/24hr.  15cc last shift-will discontinue today -acute surgical blood loss anemia - tolerating -needs to mobilize out of bed and walk with PT.  Possibly home tomorrow -DVT prophylaxis:  heparin gtt-will transition to full dose Xarelto for graft patency. -discussed importance of quitting smoking.    Doreatha Massed, PA-C [facility] [phone] 09/22/2022 6:42 AM  I have interviewed the patient and examined the patient. I agree with the findings by the PA.  Begin Xarelto today.  Anticipate discharge tomorrow.  Cari Caraway, MD

## 2022-09-22 NOTE — Progress Notes (Signed)
PHARMACIST LIPID MONITORING Benjamin Espinoza is a 57 y.o. male admitted on 09/21/2022 with PAD.  Pharmacy has been consulted to optimize lipid-lowering therapy with the indication of secondary prevention for clinical ASCVD.  Recent Labs:  Lipid Panel (last 6 months):   Lab Results  Component Value Date   CHOL 164 09/22/2022   TRIG 66 09/22/2022   HDL 37 (L) 09/22/2022   CHOLHDL 4.4 09/22/2022   VLDL 13 09/22/2022   LDLCALC 114 (H) 09/22/2022    Hepatic function panel (last 6 months):   Lab Results  Component Value Date   AST 22 09/21/2022   ALT 30 09/21/2022   ALKPHOS 67 09/21/2022   BILITOT 0.9 09/21/2022    SCr (since admission):   Serum creatinine: 1.14 mg/dL 16/10/96 0454 Estimated creatinine clearance: 82.7 mL/min  Current therapy and lipid therapy tolerance Current lipid-lowering therapy: atorvastatin 40mg  Previous lipid-lowering therapies (if applicable): -Atorvastatin 80mg : pt unclear why dose reduced -Repatha: pt dc'ed after some solution came in contact with his skin & caused rash Documented or reported allergies or intolerances to lipid-lowering therapies (if applicable): N/A  Assessment:   Patient agrees with changes to lipid-lowering therapy  Plan:    1.Statin intensity (high intensity recommended for all patients regardless of the LDL):  No statin changes. The patient is already on a high intensity statin.  2.Add ezetimibe (if any one of the following):   Not indicated at this time.  3.Refer to lipid clinic:   No  4.Follow-up with:  Primary care provider - Maye Hides, PA  5.Follow-up labs after discharge:  Changes in lipid therapy were made. Check a lipid panel in 8-12 weeks then annually.     Nicole Kindred, PharmD PGY1 Pharmacy Resident 09/22/2022 7:35 AM

## 2022-09-22 NOTE — Progress Notes (Signed)
Pt is alert and fully oriented x 4, afebrile, stable hemodynamically, NSR on the monitor, no acute distress. He has a complaint of severe pain on his right leg.   Pt refuses Morphine due to the side effect of making him nauseated. Dr. [name] is notified requesting for the alternative pain controlled med. We has alternated Oxycodone 10 mg q 4 hrs, Tylenol 650 mg q 4 hrs and Dilaudid 1 mg q 4 hrs after Pt refused Morphine.  His pain has been well tolerated. Pt is able to rest better tonight.   Right leg incision with original dressing is dry and clean, no active bleeding or hematoma. Doppler gets good signals on PT and DP pulses bilaterally. On Heparin gtt is titrated per pharmacist. We will continue to monitor.  Filiberto Pinks, RN

## 2022-09-22 NOTE — Progress Notes (Signed)
ANTICOAGULATION CONSULT NOTE - Follow Up Consult  Pharmacy Consult for Heparin to Xarelto Indication:  Bypass graft patency  Allergies  Allergen Reactions   Imdur [Isosorbide Nitrate] Other (See Comments)    Severe headache    Patient Measurements: Height: 5\' 10"  (177.8 cm) Weight: 92.4 kg (203 lb 10.1 oz) IBW/kg (Calculated) : 73 Heparin Dosing Weight: 91.6  Vital Signs: Temp: 98 F (36.7 C) (07/24 0758) Temp Source: Oral (07/24 0758) BP: 145/98 (07/24 0927) Pulse Rate: 73 (07/24 0927)  Labs: Recent Labs    09/21/22 0802 09/21/22 1542 09/22/22 0500  HGB 14.9  --  12.5*  HCT 46.1  --  35.6*  PLT 350  --  331  APTT 30  --   --   LABPROT 13.8  --   --   INR 1.0  --   --   HEPARINUNFRC  --  0.18* 0.19*  CREATININE 1.06  --  1.14    Estimated Creatinine Clearance: 82.7 mL/min (by C-G formula based on SCr of 1.14 mg/dL).   Medical History: Past Medical History:  Diagnosis Date   Abnormal stress test    Anxiety    Asthma    as a child   Atypical chest pain 05/07/2017   Coronary artery disease    DDD (degenerative disc disease), lumbar 11/08/2019   Formatting of this note might be different from the original. 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 [place]   Hyperlipidemia    Myocardial infarction [facility]) 2017   PVD (peripheral vascular disease) (HCC) 09/21/2019   Shortness of breath    Tobacco use disorder     Medications:  Scheduled:   aspirin EC  81 mg Oral Q0600   atenolol  50 mg Oral Daily   atorvastatin  80 mg Oral Daily   docusate sodium  100 mg Oral Daily   losartan  25 mg Oral Daily   pantoprazole  40 mg Oral Daily   Infusions:   sodium chloride     sodium chloride Stopped (09/22/22 0448)   heparin 1,350 Units/hr (09/22/22 0611)   magnesium sulfate bolus IVPB      Assessment: 56YOM who was initiated on heparin therapy for bypass graft patency. Patient is now being transitioned to Xarelto. Not on [facility] prior to  admission.  Hgb 12.5, plt 331.  Goal of Therapy:  Monitor platelets by anticoagulation protocol: Yes   Plan:  Discontinue heparin infusion Start Xarelto 20mg  PO daily & provide education Continue to monitor H&H and platelets Pharmacy to follow peripherally  Nicole Kindred, PharmD PGY1 Pharmacy Resident 09/22/2022 10:27 AM

## 2022-09-22 NOTE — Evaluation (Signed)
Physical Therapy Evaluation Patient Details Name: Benjamin Espinoza MRN: [id number] DOB: [birth year] Today's Date: 09/22/2022  History of Present Illness  57 y.o. male who presents as a urgent triage for severe right lower extremity pain. He has history of right femoral to below knee popliteal bypass with PTFE by Dr. [name] on 12/25/19. 09/21/2022 s/p Redo exposure right CFA and BK popliteal artery with right femoral to BK popliteal bypass with PTFE and JP drain placement. PMH includes hyperlipidemia, HTN, CABG PVD, CAD.  Clinical Impression  Pt admitted with/for LE pain and need for bypass grafting R L.  Pt currently limited functionally due to the problems listed below.  (see problems list.)  Pt will benefit from PT to maximize function and safety to be able to get home safely with available assist .         Assistance Recommended at Discharge PRN  If plan is discharge home, recommend the following:  Can travel by private vehicle  Assistance with cooking/housework;Help with stairs or ramp for entrance        Equipment Recommendations Rolling walker (2 wheels)  Recommendations for Other Services       Functional Status Assessment Patient has had a recent decline in their functional status and demonstrates the ability to make significant improvements in function in a reasonable and predictable amount of time.     Precautions / Restrictions Precautions Precautions: Fall Restrictions Weight Bearing Restrictions: No RLE Weight Bearing: Weight bearing as tolerated      Mobility  Bed Mobility Overal bed mobility: Modified Independent             General bed mobility comments: hob elevated    Transfers Overall transfer level: Needs assistance Equipment used: Rolling walker (2 wheels) Transfers: Sit to/from Stand Sit to Stand: Min guard           General transfer comment: minguard for safety    Ambulation/Gait Ambulation/Gait assistance: Min guard Gait  Distance (Feet): 100 Feet Assistive device: Rolling walker (2 wheels) Gait Pattern/deviations: Step-to pattern Gait velocity: decreased Gait velocity interpretation: <1.31 ft/sec, indicative of household ambulator   General Gait Details: light w/bearing on R LE with pronounced, but stable step to pattern  Stairs Stairs:  (pt declined, but verbalized sequencing)          Wheelchair Mobility     Tilt Bed    Modified Rankin (Stroke Patients Only)       Balance Overall balance assessment: Needs assistance Sitting-balance support: Feet supported, No upper extremity supported Sitting balance-Leahy Scale: Good     Standing balance support: Single extremity supported, During functional activity Standing balance-Leahy Scale: Poor Standing balance comment: reliant on single UE support for pain management                             Pertinent Vitals/Pain Pain Assessment Pain Assessment: 0-10 Pain Score: 8  Pain Location: RLE Pain Descriptors / Indicators: Sore Pain Intervention(s): Monitored during session    Home Living Family/patient expects to be discharged to:: Private residence Living Arrangements: Spouse/significant other Available Help at Discharge: Personal care attendant Type of Home: Apartment Home Access: Level entry     Alternate Level Stairs-Number of Steps: flight Home Layout: Able to live on main level with bedroom/bathroom;Two level Home Equipment: Cane - single point Additional Comments: pt reports 1/2 bath on main level,tub shower is upstairs    Prior Function Prior Level of Function : Independent/Modified Independent  Mobility Comments: pt reports needing help on the stairs prior to admission ADLs Comments: otherwise independent with ADL, wife will do the driving, manages medications, wife was grocery shopping, nurse aide comes 7 days/week for 3-4hours (assists with meals, home management, etc)     Hand Dominance    Dominant Hand: Right    Extremity/Trunk Assessment   Upper Extremity Assessment Upper Extremity Assessment: Overall WFL for tasks assessed    Lower Extremity Assessment Lower Extremity Assessment: Overall WFL for tasks assessed RLE Deficits / Details: post op pain, limited knee flexion due to pain    Cervical / Trunk Assessment Cervical / Trunk Assessment: Normal  Communication   Communication: No difficulties  Cognition Arousal/Alertness: Awake/alert Behavior During Therapy: WFL for tasks assessed/performed Overall Cognitive Status: Within Functional Limits for tasks assessed                                          General Comments General comments (skin integrity, edema, etc.): vss on RA    Exercises     Assessment/Plan    PT Assessment Patient needs continued PT services  PT Problem List Decreased mobility;Pain       PT Treatment Interventions Stair training;Functional mobility training;Therapeutic activities;Patient/family education    PT Goals (Current goals can be found in the Care Plan section)  Acute Rehab PT Goals Patient Stated Goal: home tomorrow PT Goal Formulation: With patient Time For Goal Achievement: 09/24/22 Potential to Achieve Goals: Good    Frequency Min 2X/week     Co-evaluation               AM-PAC PT "6 Clicks" Mobility  Outcome Measure Help needed turning from your back to your side while in a flat bed without using bedrails?: None Help needed moving from lying on your back to sitting on the side of a flat bed without using bedrails?: None Help needed moving to and from a bed to a chair (including a wheelchair)?: A Little Help needed standing up from a chair using your arms (e.g., wheelchair or bedside chair)?: A Little Help needed to walk in hospital room?: A Little Help needed climbing 3-5 steps with a railing? : A Little 6 Click Score: 20    End of Session   Activity Tolerance: Patient limited by  pain Patient left: in bed;with call bell/phone within reach Nurse Communication: Mobility status PT Visit Diagnosis: Pain;Other abnormalities of gait and mobility (R26.89) Pain - Right/Left: Right Pain - part of body: Leg    Time: 6045-4098 PT Time Calculation (min) (ACUTE ONLY): 16 min   Charges:   PT Evaluation $PT Eval Low Complexity: 1 Low   PT General Charges $$ ACUTE PT VISIT: 1 Visit         09/22/2022  Jacinto Halim., PT Acute Rehabilitation Services [phone]  (office)  Benjamin Espinoza 09/22/2022, 2:32 PM

## 2022-09-22 NOTE — Progress Notes (Signed)
ANTICOAGULATION CONSULT NOTE - Follow Up Consult  Pharmacy Consult for Heparin Indication:  Bypass graft patency  Allergies  Allergen Reactions   Imdur [Isosorbide Nitrate] Other (See Comments)    Severe headache    Patient Measurements: Height: 5\' 10"  (177.8 cm) Weight: 92.4 kg (203 lb 10.1 oz) IBW/kg (Calculated) : 73 Heparin Dosing Weight: 91.6  Vital Signs: Temp: 98.4 F (36.9 C) (07/24 0400) Temp Source: Oral (07/24 0400) BP: 130/88 (07/24 0400) Pulse Rate: 74 (07/24 0400)  Labs: Recent Labs    09/21/22 0802 09/21/22 1542 09/22/22 0500  HGB 14.9  --  12.5*  HCT 46.1  --  35.6*  PLT 350  --  331  APTT 30  --   --   LABPROT 13.8  --   --   INR 1.0  --   --   HEPARINUNFRC  --  0.18* 0.19*  CREATININE 1.06  --  1.14    Estimated Creatinine Clearance: 82.7 mL/min (by C-G formula based on SCr of 1.14 mg/dL).   Medical History: Past Medical History:  Diagnosis Date   Abnormal stress test    Anxiety    Asthma    as a child   Atypical chest pain 05/07/2017   Coronary artery disease    DDD (degenerative disc disease), lumbar 11/08/2019   Formatting of this note might be different from the original. 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 [place]   Hyperlipidemia    Myocardial infarction [facility]) 2017   PVD (peripheral vascular disease) (HCC) 09/21/2019   Shortness of breath    Tobacco use disorder     Medications:  Scheduled:   aspirin EC  81 mg Oral Q0600   atenolol  50 mg Oral Daily   atorvastatin  80 mg Oral Daily   docusate sodium  100 mg Oral Daily   losartan  25 mg Oral Daily   pantoprazole  40 mg Oral Daily   Infusions:   sodium chloride     sodium chloride Stopped (09/22/22 0448)   heparin 1,100 Units/hr (09/21/22 2229)   magnesium sulfate bolus IVPB      Assessment: 56YOM being initiated on heparin therapy for bypass graft patency. During below-knee popliteal bypass, patient received 9000 unit heparin bolus  along with 40mg  of protamine.  Heparin level 0.19 on heparin 1100 units/hr. No bleeding issues reported per discussion with RN. Heparin infusion increased to full dose yesterday per VSS.  Goal of Therapy:  Heparin level 0.3-0.7 units/ml Monitor platelets by anticoagulation protocol: Yes   Plan:  Increase heparin infusion to 1350 units/hr. Check 6hr heparin level  Daily Heparin level and CBC Continue to monitor H&H and platelets  Ruben Im, PharmD Clinical Pharmacist 09/22/2022 5:51 AM Please check AMION for all [facility] Pharmacy numbers

## 2022-09-22 NOTE — TOC Benefit Eligibility Note (Signed)
Pharmacy Patient Advocate Encounter  Insurance verification completed.    The patient is insured through [facility] Medicare Part D  Ran test claim for Xarelto 20 mg and the current 30 day co-pay is $0.00.   This test claim was processed through [facility]- copay amounts may vary at other pharmacies due to pharmacy/plan contracts, or as the patient moves through the different stages of their insurance plan.    Roland Earl, CPHT Pharmacy Patient Advocate Specialist [facility] Patient Advocate Team Direct Number: [phone]  Fax: [phone]

## 2022-09-22 NOTE — Discharge Instructions (Signed)
Information on my medicine - XARELTO (Rivaroxaban)  This medication education was reviewed with me or my healthcare representative as part of my discharge preparation.  What do you need to know about xarelto ? Take your Xarelto ONCE DAILY at the same time every day with your evening meal. If you have difficulty swallowing the tablet whole, you may crush it and mix in applesauce just prior to taking your dose.  Take Xarelto exactly as prescribed by your doctor and DO NOT stop taking Xarelto without talking to the doctor who prescribed the medication.  Stopping without other stroke prevention medication to take the place of Xarelto may increase your risk of developing a clot that causes a stroke.  Refill your prescription before you run out.  After discharge, you should have regular check-up appointments with your healthcare provider that is prescribing your Xarelto.  In the future your dose may need to be changed if your kidney function or weight changes by a significant amount.  What do you do if you miss a dose? If you are taking Xarelto ONCE DAILY and you miss a dose, take it as soon as you remember on the same day then continue your regularly scheduled once daily regimen the next day. Do not take two doses of Xarelto at the same time or on the same day.   Important Safety Information A possible side effect of Xarelto is bleeding. You should call your healthcare provider right away if you experience any of the following: Bleeding from an injury or your nose that does not stop. Unusual colored urine (red or dark brown) or unusual colored stools (red or black). Unusual bruising for unknown reasons. A serious fall or if you hit your head (even if there is no bleeding).  Some medicines may interact with Xarelto and might increase your risk of bleeding while on Xarelto. To help avoid this, consult your healthcare provider or pharmacist prior to using any new prescription or  non-prescription medications, including herbals, vitamins, non-steroidal anti-inflammatory drugs (NSAIDs) and supplements.  This website has more information on Xarelto: [email or web address].br.    [facility]  Discharge instructions  Lower Extremity Bypass Surgery  Please refer to the following instruction for your post-procedure care. Your surgeon or physician assistant will discuss any changes with you.  Activity  You are encouraged to walk as much as you can. You can slowly return to normal activities during the month after your surgery. Avoid strenuous activity and heavy lifting until your doctor tells you it's OK. Avoid activities such as vacuuming or swinging a golf club. Do not drive until your doctor give the OK and you are no longer taking prescription pain medications. It is also normal to have difficulty with sleep habits, eating and bowel movement after surgery. These will go away with time.  Bathing/Showering  Shower daily after you go home. Do not soak in a bathtub, hot tub, or swim until the incision heals completely.  Incision Care  Clean your incision with mild soap and water. Shower every day. Pat the area dry with a clean towel. You do not need a bandage unless otherwise instructed. Do not apply any ointments or creams to your incision. If you have open wounds you will be instructed how to care for them or a visiting nurse may be arranged for you. If you have staples or sutures along your incision they will be removed at your post-op appointment. You may have skin glue on your incision. Do  not peel it off. It will come off on its own in about one week.  Wash the groin wound with soap and water daily and pat dry. (No tub bath-only shower)  Then put a dry gauze or washcloth in the groin to keep this area dry to help prevent wound infection.  Do this daily and as needed.  Do not use Vaseline or neosporin on your incisions.  Only use soap and water  on your incisions and then protect and keep dry.  Diet  Resume your normal diet. There are no special food restrictions following this procedure. A low fat/ low cholesterol diet is recommended for all patients with vascular disease. In order to heal from your surgery, it is CRITICAL to get adequate nutrition. Your body requires vitamins, minerals, and protein. Vegetables are the best source of vitamins and minerals. Vegetables also provide the perfect balance of protein. Processed food has little nutritional value, so try to avoid this.  Medications  Resume taking all your medications unless your doctor or physician assistant tells you not to. If your incision is causing pain, you may take over-the-counter pain relievers such as acetaminophen (Tylenol). If you were prescribed a stronger pain medication, please aware these medication can cause nausea and constipation. Prevent nausea by taking the medication with a snack or meal. Avoid constipation by drinking plenty of fluids and eating foods with high amount of fiber, such as fruits, vegetables, and grains. Take Colace 100 mg (an over-the-counter stool softener) twice a day as needed for constipation.  Do not take Tylenol if you are taking prescription pain medications.  Follow Up  Our office will schedule a follow up appointment 2-3 weeks following discharge.  Please call us immediately for any of the following conditions  Severe or worsening pain in your legs or feet while at rest or while walking Increase pain, redness, warmth, or drainage (pus) from your incision site(s) Fever of 101 degree or higher The swelling in your leg with the bypass suddenly worsens and becomes more painful than when you were in the hospital If you have been instructed to feel your graft pulse then you should do so every day. If you can no longer feel this pulse, call the office immediately. Not all patients are given this instruction.  Leg swelling is common after  leg bypass surgery.  The swelling should improve over a few months following surgery. To improve the swelling, you may elevate your legs above the level of your heart while you are sitting or resting. Your surgeon or physician assistant may ask you to apply an ACE wrap or wear compression (TED) stockings to help to reduce swelling.  Reduce your risk of vascular disease  Stop smoking. If you would like help call [facility] at 1-800-QUIT-NOW (812-404-3550) or [place] at (567)826-6319.  Manage your cholesterol Maintain a desired weight Control your diabetes weight Control your diabetes Keep your blood pressure down  If you have any questions, please call the office at [phone]

## 2022-09-22 NOTE — Evaluation (Signed)
Occupational Therapy Evaluation Patient Details Name: Benjamin Espinoza MRN: [id number] DOB: 01/23/1966 Today's Date: 09/22/2022   History of Present Illness 57 y.o. male who presents as a urgent triage for severe right lower extremity pain. He has history of right femoral to below knee popliteal bypass with PTFE by Dr. [name] on 12/25/19. 09/21/2022 s/p Redo exposure right CFA and BK popliteal artery with right femoral to BK popliteal bypass with PTFE and JP drain placement. PMH includes hyperlipidemia, HTN, CABG PVD, CAD.   Clinical Impression   Pt reports PTA he was living at home with his wife. He reports he has a pca 7days/week for 3-4hrs to assist with IADL. Pt reports he is able to live on the main level with a 1/2 bathroom available. Pt currently requires minguard assistance with functional mobility at RW level. He tolerate OOB ADL for about this session, reporting 8/10 pain level throughout session. Will continue to follow acutely to allow safe d/c home with support from his wife and pca. Do not anticipate pt to need OT following d/c.       Recommendations for follow up therapy are one component of a multi-disciplinary discharge planning process, led by the attending physician.  Recommendations may be updated based on patient status, additional functional criteria and insurance authorization.   Assistance Recommended at Discharge PRN  Patient can return home with the following A little help with bathing/dressing/bathroom;Assist for transportation;A little help with walking and/or transfers    Functional Status Assessment  Patient has had a recent decline in their functional status and demonstrates the ability to make significant improvements in function in a reasonable and predictable amount of time.  Equipment Recommendations  None recommended by OT    Recommendations for Other Services PT consult     Precautions / Restrictions Precautions Precautions:  Fall Restrictions Weight Bearing Restrictions: No RLE Weight Bearing: Weight bearing as tolerated      Mobility Bed Mobility Overal bed mobility: Modified Independent             General bed mobility comments: hob elevated    Transfers Overall transfer level: Needs assistance Equipment used: Rolling walker (2 wheels) Transfers: Sit to/from Stand Sit to Stand: Min guard           General transfer comment: minguard for safety      Balance Overall balance assessment: Needs assistance Sitting-balance support: Feet supported, No upper extremity supported Sitting balance-Leahy Scale: Good     Standing balance support: Single extremity supported, During functional activity Standing balance-Leahy Scale: Poor Standing balance comment: reliant on single UE support for pain management                           ADL either performed or assessed with clinical judgement   ADL Overall ADL's : Needs assistance/impaired Eating/Feeding: Independent   Grooming: Supervision/safety;Standing   Upper Body Bathing: Independent   Lower Body Bathing: Min guard;Sit to/from stand   Upper Body Dressing : Independent   Lower Body Dressing: Minimal assistance;Sit to/from stand Lower Body Dressing Details (indicate cue type and reason): assist to don R sock Toilet Transfer: Min guard;Ambulation Toilet Transfer Details (indicate cue type and reason): minguard for safety and line management Toileting- Clothing Manipulation and Hygiene: Min guard;Sit to/from stand       Functional mobility during ADLs: Min guard;Rolling walker (2 wheels) General ADL Comments: pt limited by pain and decreased activity tolerance     Vision  Perception     Praxis      Pertinent Vitals/Pain Pain Assessment Pain Assessment: 0-10 Pain Score: 8  Pain Location: RLE Pain Descriptors / Indicators: Sore Pain Intervention(s): Limited activity within patient's tolerance, Monitored  during session     Hand Dominance Right   Extremity/Trunk Assessment Upper Extremity Assessment Upper Extremity Assessment: Overall WFL for tasks assessed   Lower Extremity Assessment Lower Extremity Assessment: Defer to PT evaluation;RLE deficits/detail RLE Deficits / Details: post op pain, limited knee flexion due to pain   Cervical / Trunk Assessment Cervical / Trunk Assessment: Normal   Communication Communication Communication: No difficulties   Cognition Arousal/Alertness: Awake/alert Behavior During Therapy: WFL for tasks assessed/performed Overall Cognitive Status: Within Functional Limits for tasks assessed                                       General Comments  vss on RA    Exercises     Shoulder Instructions      Home Living Family/patient expects to be discharged to:: Private residence Living Arrangements: Spouse/significant other Available Help at Discharge: Personal care attendant Type of Home: Apartment Home Access: Level entry     Home Layout: Able to live on main level with bedroom/bathroom;Two level Alternate Level Stairs-Number of Steps: flight   Bathroom Shower/Tub: Chief Strategy Officer: Standard Bathroom Accessibility: Yes How Accessible: Accessible via walker Home Equipment: Cane - single point   Additional Comments: pt reports 1/2 bath on main level,tub shower is upstairs      Prior Functioning/Environment Prior Level of Function : Independent/Modified Independent             Mobility Comments: pt reports needing help on the stairs prior to admission ADLs Comments: otherwise independent with ADL, wife will do the driving, manages medications, wife was grocery shopping, nurse aide comes 7 days/week for 3-4hours (assists with meals, home management, etc)        OT Problem List: Decreased activity tolerance;Impaired balance (sitting and/or standing);Pain      OT Treatment/Interventions:  Self-care/ADL training;Therapeutic exercise;DME and/or AE instruction;Therapeutic activities;Patient/family education;Balance training    OT Goals(Current goals can be found in the care plan section) Acute Rehab OT Goals Patient Stated Goal: to go home tomorrow OT Goal Formulation: With patient Time For Goal Achievement: 10/06/22 Potential to Achieve Goals: Good ADL Goals Pt Will Transfer to Toilet: with modified independence;ambulating Pt Will Perform Tub/Shower Transfer: with modified independence;shower seat  OT Frequency: Min 1X/week    Co-evaluation              AM-PAC OT "6 Clicks" Daily Activity     Outcome Measure Help from another person eating meals?: None Help from another person taking care of personal grooming?: A Little Help from another person toileting, which includes using toliet, bedpan, or urinal?: A Little Help from another person bathing (including washing, rinsing, drying)?: A Little Help from another person to put on and taking off regular upper body clothing?: None Help from another person to put on and taking off regular lower body clothing?: A Little 6 Click Score: 20   End of Session Equipment Utilized During Treatment: Rolling walker (2 wheels) Nurse Communication: Mobility status  Activity Tolerance: Patient tolerated treatment well Patient left: in bed;with call bell/phone within reach;with bed alarm set  OT Visit Diagnosis: Other abnormalities of gait and mobility (R26.89);Pain Pain - Right/Left: Right Pain -  part of body: Leg                Time: 1055-1120 OT Time Calculation (min): 25 min Charges:  OT General Charges $OT Visit: 1 Visit OT Evaluation $OT Eval Low Complexity: 1 Low OT Treatments $Self Care/Home Management : 8-22 mins  Rosey Bath OTR/L Acute Rehabilitation Services Office: [phone]   Rebeca Alert 09/22/2022, 12:12 PM

## 2022-09-23 ENCOUNTER — Other Ambulatory Visit (HOSPITAL_COMMUNITY): Payer: Self-pay

## 2022-09-23 LAB — CBC
HCT: 39 % (ref 39.0–52.0)
Hemoglobin: 13.3 g/dL (ref 13.0–17.0)
MCH: 30.9 pg (ref 26.0–34.0)
MCHC: 34.1 g/dL (ref 30.0–36.0)
MCV: 90.5 fL (ref 80.0–100.0)
Platelets: 339 10*3/uL (ref 150–400)
RDW: 13.4 % (ref 11.5–15.5)
WBC: 17.2 10*3/uL — ABNORMAL HIGH (ref 4.0–10.5)
nRBC: 0 % (ref 0.0–0.2)

## 2022-09-23 MED ORDER — OXYCODONE-ACETAMINOPHEN 5-325 MG PO TABS
1.0000 | ORAL_TABLET | Freq: Four times a day (QID) | ORAL | 0 refills | Status: DC | PRN
Start: 2022-09-23 — End: 2022-10-29
  Filled 2022-09-23: qty 12, 3d supply, fill #0

## 2022-09-23 MED ORDER — ASPIRIN EC 81 MG PO TBEC: 81.0000 mg | DELAYED_RELEASE_TABLET | Freq: Every day | ORAL | Status: DC

## 2022-09-23 MED ORDER — RIVAROXABAN 20 MG PO TABS
20.0000 mg | ORAL_TABLET | Freq: Every day | ORAL | 6 refills | Status: DC
  Filled 2022-09-23: qty 30, 30d supply, fill #0

## 2022-09-23 NOTE — Progress Notes (Addendum)
Discharge instructions reviewed with pt.  Copy of instructions given to pt. [facility] filled 2 scripts for pt, meds were delivered to pt's room and given to pt. Pt has DME RW delivered to room and will go with pt at discharge. Pt states his ride is on their way. Pt working on getting dressed at this time. Pt provided with some dry gauze dressings to use in groin area as per MD d/c  instructions.   Pt to be d/c'd via wheelchair with belongings.           To be escorted by staff/hospital volunteer.   Annice Needy, RN SWOT   At 1115---pt has confirmed now that his wife is on her way, will take pt down to the discharge lounge via wheelchair and his belongings, TOC meds and RW (and his personal cane). Asier Desroches RN SWOT

## 2022-09-23 NOTE — Care Management (Addendum)
Transition of Care [facility]) - Inpatient Brief Assessment   Patient Details  Name: Benjamin Espinoza MRN: [id number] Date of Birth: Jun 27, 1965  [facility]) CM/SW Contact:    Lockie Pares, RN Phone Number: 09/23/2022, 8:03 AM   Clinical Narrative:  57 yo presented with vascular surgery. He is followed by Iantha Fallen for [facility] nursing. Spoke to patient via phone. He needs a walker, which is ordered, and renewal of HH orders. Messaged Amy from [facility] to let her know he will be DC today. No transportation needs.  [employer] notified of walker need  Transition of Care Asessment: Insurance and Status: Insurance coverage has been reviewed Patient has primary care physician: Yes Home environment has been reviewed: yes Prior level of function:: had HH nursing Prior/Current Home Services: Current home services Social Determinants of Health Reivew: SDOH reviewed no interventions necessary Readmission risk has been reviewed: Yes Transition of care needs: transition of care needs identified, TOC will continue to follow

## 2022-09-23 NOTE — Progress Notes (Signed)
RN called me while in pt's room.    PDMP reviewed.  Pt has received pain medication every month since October.  He last received 21 tablets on 09/19/2022 prescribed by Dr. Laurence Aly.    Pt states that he does not have pain medication at home and he is not on a pain contract.  He states he no longer sees Dr. Hyacinth Meeker.   RN relayed to pt that if he does have a pain contract and I prescribe him pain medication, it would void his contract.    He adamantly was denying he had contract.    Given he had redo right leg bypass, I sent Percocet 5/325 one q6h prn pain #12 (twelve) no refill.     Doreatha Massed, [facility] 09/23/2022 10:25 AM

## 2022-09-23 NOTE — Anesthesia Postprocedure Evaluation (Signed)
Anesthesia Post Note  Patient: Benjamin Espinoza  Procedure(s) Performed: REDO RIGHT LOWER EXTREMITY BYPASS USING GORE PROPATEN 6mm REMOVALBLE RING GRAFT (Right) INTRA OPERATIVE ARTERIOGRAM (Right: Leg Lower)     Patient location during evaluation: PACU Anesthesia Type: General Level of consciousness: awake and alert Pain management: pain level controlled Vital Signs Assessment: post-procedure vital signs reviewed and stable Respiratory status: spontaneous breathing, nonlabored ventilation and respiratory function stable Cardiovascular status: blood pressure returned to baseline and stable Postop Assessment: no apparent nausea or vomiting Anesthetic complications: no   No notable events documented.  Last Vitals:  Vitals:   09/22/22 2325 09/23/22 0408  BP: (!) 153/94 (!) 150/99  Pulse: 80 84  Resp: 14 14  Temp: 37.5 C 37.7 C  SpO2: 100% 100%    Last Pain:  Vitals:   09/23/22 0525  TempSrc:   PainSc: Asleep                 Lowella Curb

## 2022-09-23 NOTE — Progress Notes (Signed)
OT Cancellation Note  Patient Details Name: Benjamin Espinoza MRN: [id number] DOB: [birth year]   Cancelled Treatment:    Reason Eval/Treat Not Completed: Patient declined, no reason specified (pt declines OOB mobility/OT tx at this time despite encouragement since pt has planned d/c today. Pt states he has spouse who will help him at home. Will follow up as schedule permits.)  Carver Fila, OTD, OTR/L [facility] Acute Rehab [phone]   Dalphine Handing 09/23/2022, 7:59 AM

## 2022-09-23 NOTE — Discharge Summary (Signed)
Discharge Summary     Benjamin Espinoza [birth year] 57 y.o. male  [id number]  Admission Date: 09/21/2022  Discharge Date: 09/23/2022  Physician: [name], *  Admission Diagnosis: PAD (peripheral artery disease) (HCC) [I73.9]  HPI:   This is a 57 y.o. male  who presents as a urgent triage for severe right lower extremity pain. He has history of right femoral to below knee popliteal bypass with PTFE by Dr. [name] on 12/25/19. This was for CLR with rest pain. At his last visit in February he was doing well. His duplex showed widely patent bypass. He explains that since his last visit in February he began having pain in the right calf and has now progressed to right foot. He says the pain intensified recently over past couple weeks. He has not been able to bear much weight on the leg or foot. Says great toe is extremely tender to the touch. He is using cane to help ambulate. His pain is keeping him awake at night.  He reports being compliant with his Aspirin, Statin, and Pletal. He does still smoke.   Hospital Course:  The patient was admitted to the hospital and taken to the operating room on 09/21/2022 and underwent: Redo exposure of right common femoral artery and right below-knee popliteal artery Right femoral to below-knee popliteal artery bypass with 6 mm PTFE Intraoperative arteriogram    Findings: The below-knee popliteal artery had significant disease. He had disease in the tibial peroneal trunk but I felt bypass to the below-knee popliteal artery would be better than bypass to the posterior tibial artery given that this would be an all prosthetic bypass.   The pt tolerated the procedure well and was transported to the PACU in good condition.   Post operative course included heparin gtt that was converted over to Xarelto prior to discharge.  He was able to work with PT/OT.  He was instructed on importance of smoking cessation.  Pt is discharged home on POD 2.     Prior to discharge:  RN called me while in pt's room.     PDMP reviewed.  Pt has received pain medication every month since October.  He last received 21 tablets on 09/19/2022 prescribed by Dr. Laurence Aly.     Pt states that he does not have pain medication at home and he is not on a pain contract.  He states he no longer sees Dr. Hyacinth Meeker.   RN relayed to pt that if he does have a pain contract and I prescribe him pain medication, it would void his contract.     He adamantly was denying he had contract.     Given he had redo right leg bypass, I sent Percocet 5/325 one q6h prn pain #12 (twelve) no refill.     CBC    Component Value Date/Time   WBC 17.2 (H) 09/23/2022 0031   RBC 4.31 09/23/2022 0031   HGB 13.3 09/23/2022 0031   HGB 14.2 04/09/2021 0904   HCT 39.0 09/23/2022 0031   HCT 41.6 04/09/2021 0904   PLT 339 09/23/2022 0031   PLT 231 04/09/2021 0904   MCV 90.5 09/23/2022 0031   MCV 92 04/09/2021 0904   MCH 30.9 09/23/2022 0031   MCHC 34.1 09/23/2022 0031   RDW 13.4 09/23/2022 0031   RDW 13.1 04/09/2021 0904   LYMPHSABS 2.1 04/09/2021 0904   EOSABS 0.1 04/09/2021 0904   BASOSABS 0.1 04/09/2021 0904    BMET    Component Value  Date/Time   NA 137 09/22/2022 0500   NA 141 04/09/2021 0904   K 3.5 09/22/2022 0500   CL 104 09/22/2022 0500   CO2 26 09/22/2022 0500   GLUCOSE 112 (H) 09/22/2022 0500   BUN 11 09/22/2022 0500   BUN 13 04/09/2021 0904   CREATININE 1.14 09/22/2022 0500   CALCIUM 9.1 09/22/2022 0500   GFRNONAA >60 09/22/2022 0500   GFRAA 99 11/13/2019 1359     Discharge Instructions     Discharge patient   Complete by: As directed    Discharge once his HH needs have been arranged by [facility]   Discharge disposition: 01-Home or Self Care   Discharge patient date: 09/23/2022       Discharge Diagnosis:  PAD (peripheral artery disease) (HCC) [I73.9]  Secondary Diagnosis: Patient Active Problem List   Diagnosis Date Noted   PAD (peripheral artery  disease) (HCC) 09/21/2022   Ischemic cardiomyopathy 12/17/2020   NSTEMI (non-ST elevated myocardial infarction) (HCC) 11/28/2020   Anxiety    Asthma    Coronary artery disease    Peripheral arterial disease (HCC) 12/25/2019   Sinus bradycardia 11/29/2019   Abnormal stress test    Shortness of breath    DDD (degenerative disc disease), lumbar 11/08/2019   Essential hypertension    Hx of CABG    Hyperlipidemia    Myocardial infarction (HCC)    Tobacco use disorder    PVD (peripheral vascular disease) (HCC) 09/21/2019   Atypical chest pain 05/07/2017   Past Medical History:  Diagnosis Date   Abnormal stress test    Anxiety    Asthma    as a child   Atypical chest pain 05/07/2017   Coronary artery disease    DDD (degenerative disc disease), lumbar 11/08/2019   Formatting of this note might be different from the original. 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 What changed: how much to take   atenolol 50 MG tablet Commonly known as: TENORMIN Take 50 mg by mouth daily.   atorvastatin 80 MG tablet Commonly known as: LIPITOR Take 1 tablet (80 mg total) by mouth daily. What changed: Another medication with the same name was removed. Continue taking this medication, and follow the directions you see here.   cilostazol 100 MG tablet Commonly known as: PLETAL Take 100 mg by mouth 2 (two) times daily.   clopidogrel 75 MG tablet Commonly known as:  PLAVIX TAKE 1 TABLET(75 MG) BY MOUTH DAILY   losartan 25 MG tablet Commonly known as: COZAAR TAKE 1 TABLET(25 MG) BY MOUTH DAILY What changed:  how much to take how to take this when to take this additional instructions   oxyCODONE-acetaminophen 5-325 MG tablet Commonly known as: PERCOCET/ROXICET Take 1 tablet by mouth every 6 (six) hours as needed for severe pain.   rivaroxaban 20 MG  Tabs tablet Commonly known as: XARELTO Take 1 tablet (20 mg total) by mouth daily with lunch.               Durable Medical Equipment  (From admission, onward)           Start     Ordered   09/23/22 0641  For home use only DME Walker rolling  Once       Question Answer Comment  Walker: With 5 Inch Wheels   Patient needs a walker to treat with the following condition S/P femoral-popliteal bypass surgery      09/23/22 0641            Discharge Instructions: [facility] Discharge instructions Lower Extremity Bypass Surgery  Please refer to the following instruction for your post-procedure care. Your surgeon or physician assistant will discuss any changes with you.  Activity  You are encouraged to walk as much as you can. You can slowly return to normal activities during the month after your surgery. Avoid strenuous activity and heavy lifting until your doctor tells you it's OK. Avoid activities such as vacuuming or swinging a golf club. Do not drive until your doctor give the OK and you are no longer taking prescription pain medications. It is also normal to have difficulty with sleep habits, eating and bowel movement after surgery. These will go away with time.  Bathing/Showering  You may shower after you go home. Do not soak in a bathtub, hot tub, or swim until the incision heals completely.  Incision Care  Clean your incision with mild soap and water. Shower every day. Pat the area dry with a clean towel. You do not need a bandage unless  otherwise instructed. Do not apply any ointments or creams to your incision. If you have open wounds you will be instructed how to care for them or a visiting nurse may be arranged for you. If you have staples or sutures along your incision they will be removed at your post-op appointment. You may have skin glue on your incision. Do not peel it off. It will come off on its own in about one week.  Wash the groin wound with soap and water daily and pat dry. (No tub bath-only shower)  Then put a dry gauze or washcloth in the groin to keep this area dry to help prevent wound infection.  Do this daily and as needed.  Do not use Vaseline or neosporin on your incisions.  Only use soap and water on your incisions and then protect and keep dry.  Diet  Resume your normal diet. There are no special food restrictions following this procedure. A low fat/ low cholesterol diet is recommended for all patients with vascular disease. In order to heal from your surgery, it is CRITICAL to get adequate nutrition. Your body requires vitamins, minerals, and protein. Vegetables are the best source of vitamins and minerals. Vegetables also provide the perfect balance of protein. Processed food has little nutritional value, so try to avoid this.  Medications  Resume taking all your medications unless your doctor or Physician Assistant tells you not to. If your incision is causing pain, you may take over-the-counter pain relievers such as acetaminophen (Tylenol). If you were prescribed a stronger pain medication, please aware these medication can cause nausea and constipation. Prevent nausea by taking the medication with a snack or meal. Avoid constipation by drinking plenty of fluids and eating foods with high amount of fiber,  such as fruits, vegetables, and grains. Take Colace 100 mg (an over-the-counter stool softener) twice a day as needed for constipation.  Do not take Tylenol if you are taking prescription pain  medications.  Follow Up  Our office will schedule a follow up appointment 2-3 weeks following discharge.  Please call us immediately for any of the following conditions  Severe or worsening pain in your legs or feet while at rest or while walking Increase pain, redness, warmth, or drainage (pus) from your incision site(s) Fever of 101 degree or higher The swelling in your leg with the bypass suddenly worsens and becomes more painful than when you were in the hospital If you have been instructed to feel your graft pulse then you should do so every day. If you can no longer feel this pulse, call the office immediately. Not all patients are given this instruction.  Leg swelling is common after leg bypass surgery.  The swelling should improve over a few months following surgery. To improve the swelling, you may elevate your legs above the level of your heart while you are sitting or resting. Your surgeon or physician assistant may ask you to apply an ACE wrap or wear compression (TED) stockings to help to reduce swelling.  Reduce your risk of vascular disease  Stop smoking. If you would like help call [facility] at 1-800-QUIT-NOW ((850)455-3964) or [place] at 617 730 6273.  Manage your cholesterol Maintain a desired weight Control your diabetes weight Control your diabetes Keep your blood pressure down  If you have any questions, please call the office at [phone]   Prescriptions given: 1. Xarelto 20mg  daily #30 6 refills 2.  PDMP reviewed.  No narcotics prescribed.   Disposition: home  Patient's condition: is Good  Follow up: 1. [facility] in 2-3 weeks   Doreatha Massed, PA-C [facility] [phone] 09/23/2022  7:33 AM  - For VQI Registry use ---   Post-op:  Wound infection: No  Graft infection: No  Transfusion: No    If yes, n/a units given New Arrhythmia: No Ipsilateral amputation: No, [ ]  Minor, [ ]  BKA, [ ]  AKA Discharge patency: [x ]  Primary, [ ]  Primary assisted, [ ]  Secondary, [ ]  Occluded Patency judged by: [x ] Dopper only, [ ]  Palpable graft pulse, []  Palpable distal pulse, [ ]  ABI inc. > 0.15, [ ]  Duplex Discharge ABI: R not done, L  D/C Ambulatory Status: Ambulatory  Complications: MI: No, [ ]  Troponin only, [ ]  EKG or Clinical CHF: No Resp failure:No, [ ]  Pneumonia, [ ]  Ventilator Chg in renal function: No, [ ]  Inc. Cr > 0.5, [ ]  Temp. Dialysis,  [ ]  Permanent dialysis Stroke: No, [ ]  Minor, [ ]  Major Return to OR: No  Reason for return to OR: [ ]  Bleeding, [ ]  Infection, [ ]  Thrombosis, [ ]  Revision  Discharge medications: Statin use:  yes ASA use:  yes Plavix use:  yes Beta blocker use: yes CCB use:  Yes ACEI use:   no ARB use:  yes Coumadin use: no Xarelto

## 2022-09-23 NOTE — Progress Notes (Addendum)
  Progress Note    09/23/2022 6:38 AM 2 Days Post-Op  Subjective:  wants to go home  Tm 99.9 HR 70's-80's NSR 140's-160's systolic 100% RA  Vitals:   09/22/22 2325 09/23/22 0408  BP: (!) 153/94 (!) 150/99  Pulse: 80 84  Resp: 14 14  Temp: 99.5 F (37.5 C) 99.9 F (37.7 C)  SpO2: 100% 100%    Physical Exam: General:  no distress Cardiac:  regular Lungs:  non labored Incisions:  look good  Extremities:  brisk doppler flow right foot   CBC    Component Value Date/Time   WBC 17.2 (H) 09/23/2022 0031   RBC 4.31 09/23/2022 0031   HGB 13.3 09/23/2022 0031   HGB 14.2 04/09/2021 0904   HCT 39.0 09/23/2022 0031   HCT 41.6 04/09/2021 0904   PLT 339 09/23/2022 0031   PLT 231 04/09/2021 0904   MCV 90.5 09/23/2022 0031   MCV 92 04/09/2021 0904   MCH 30.9 09/23/2022 0031   MCHC 34.1 09/23/2022 0031   RDW 13.4 09/23/2022 0031   RDW 13.1 04/09/2021 0904   LYMPHSABS 2.1 04/09/2021 0904   EOSABS 0.1 04/09/2021 0904   BASOSABS 0.1 04/09/2021 0904    BMET    Component Value Date/Time   NA 137 09/22/2022 0500   NA 141 04/09/2021 0904   K 3.5 09/22/2022 0500   CL 104 09/22/2022 0500   CO2 26 09/22/2022 0500   GLUCOSE 112 (H) 09/22/2022 0500   BUN 11 09/22/2022 0500   BUN 13 04/09/2021 0904   CREATININE 1.14 09/22/2022 0500   CALCIUM 9.1 09/22/2022 0500   GFRNONAA >60 09/22/2022 0500   GFRAA 99 11/13/2019 1359    INR    Component Value Date/Time   INR 1.0 09/21/2022 0802     Intake/Output Summary (Last 24 hours) at 09/23/2022 0981 Last data filed at 09/23/2022 0442 Gross per 24 hour  Intake 927.04 ml  Output 1820 ml  Net -892.96 ml      Assessment/Plan:  57 y.o. male is s/p:  Redo exposure right CFA and BK popliteal artery with right femoral to BK popliteal bypass with PTFE and JP drain placement   2 Days Post-Op   -pt doing well this morning.  Will discharge home today.  -DVT prophylaxis:  Xarelto started yesterday -PT no follow up but recommends  RW.  TOC and DME order placed. -f/u in 2-3 weeks on Dr. [name] clinic day for incision checks.    -pt will be on triple therapy with asa/plavix/Xarelto.  Pt was on 162mg  asa daily-instructed to change to 81mg  daily.  Doreatha Massed, PA-C [facility] [phone] 09/23/2022 6:38 AM  I have interviewed the patient and examined the patient. I agree with the findings by the PA.  The right foot is hyperemic.  His incisions look fine.  Agree with plans for discharge today on Xarelto.  Cari Caraway, MD

## 2022-09-30 ENCOUNTER — Telehealth: Payer: Self-pay | Admitting: Vascular Surgery

## 2022-09-30 NOTE — Telephone Encounter (Signed)
-----   Message from [facility] sent at 09/23/2022  7:33 AM EDT ----- S/p right leg bypass 7/23.  F/u in 2-3 weeks on [facility] clinic day for incision check.  No studies.  Thanks

## 2022-10-04 ENCOUNTER — Telehealth: Payer: Self-pay | Admitting: Vascular Surgery

## 2022-10-04 NOTE — Telephone Encounter (Signed)
-----   Message from [facility] sent at 09/23/2022  7:33 AM EDT ----- S/p right leg bypass 7/23.  F/u in 2-3 weeks on [facility] clinic day for incision check.  No studies.  Thanks

## 2022-10-11 ENCOUNTER — Telehealth: Payer: Self-pay | Admitting: Vascular Surgery

## 2022-10-11 NOTE — Telephone Encounter (Signed)
-----   Message from [facility] sent at 09/23/2022  7:33 AM EDT ----- S/p right leg bypass 7/23.  F/u in 2-3 weeks on [facility] clinic day for incision check.  No studies.  Thanks

## 2022-10-14 ENCOUNTER — Ambulatory Visit (INDEPENDENT_AMBULATORY_CARE_PROVIDER_SITE_OTHER): Payer: 59 | Admitting: Physician Assistant

## 2022-10-14 VITALS — BP 129/90 | HR 74 | Temp 98.3°F | Resp 18 | Ht 70.0 in | Wt 198.7 lb

## 2022-10-14 DIAGNOSIS — I70321 Atherosclerosis of unspecified type of bypass graft(s) of the extremities with rest pain, right leg: Secondary | ICD-10-CM | POA: Diagnosis not present

## 2022-10-14 MED ORDER — OXYCODONE-ACETAMINOPHEN 5-325 MG PO TABS
1.0000 | ORAL_TABLET | Freq: Four times a day (QID) | ORAL | 0 refills | Status: DC | PRN
Start: 2022-10-14 — End: 2022-10-29

## 2022-10-14 NOTE — Progress Notes (Signed)
  POST OPERATIVE OFFICE NOTE    CC:  F/u for surgery  HPI:  This is a 57 y.o. male who is s/p Redo exposure of right common femoral artery and right below-knee popliteal artery Right femoral to below-knee popliteal artery bypass with 6 mm PTFE.  He has history of right femoral to below knee popliteal bypass with PTFE by Dr. [name] on 12/25/19.   He is here for exam and incision checks.  He has edema and still has daily pain.  He denies frank claudication, non healing wounds or rest pain.  He tends to stay seated in a dependent position.  He continues to smoke daily.    He is medically managed on Lipitor, ASA, Plavix and Xarelto.         Allergies  Allergen Reactions   Imdur [Isosorbide Nitrate] Other (See Comments)    Severe headache    Current Outpatient Medications  Medication Sig Dispense Refill   amLODipine (NORVASC) 5 MG tablet TAKE 1 TABLET(5 MG) BY MOUTH DAILY (Patient not taking: Reported on 09/06/2022) 90 tablet 0   aspirin EC 81 MG tablet Take 1 tablet (81 mg total) by mouth daily.     atenolol (TENORMIN) 50 MG tablet Take 50 mg by mouth daily.     atorvastatin (LIPITOR) 80 MG tablet Take 1 tablet (80 mg total) by mouth daily. (Patient not taking: Reported on 09/21/2022) 90 tablet 3   cilostazol (PLETAL) 100 MG tablet Take 100 mg by mouth 2 (two) times daily.     clopidogrel (PLAVIX) 75 MG tablet TAKE 1 TABLET(75 MG) BY MOUTH DAILY (Patient not taking: Reported on 09/06/2022) 90 tablet 3   losartan (COZAAR) 25 MG tablet TAKE 1 TABLET(25 MG) BY MOUTH DAILY (Patient taking differently: Take 25 mg by mouth daily.) 90 tablet 3   oxyCODONE-acetaminophen (PERCOCET/ROXICET) 5-325 MG tablet Take 1 tablet by mouth every 6 (six) hours as needed for severe pain. 12 tablet 0   rivaroxaban (XARELTO) 20 MG TABS tablet Take 1 tablet (20 mg total) by mouth daily with lunch. 30 tablet 6   Current Facility-Administered Medications  Medication Dose Route Frequency Provider Last Rate Last Admin    sodium chloride flush (NS) 0.9 % injection 3 mL  3 mL Intravenous Q12H Tobb, Kardie, DO         ROS:  See HPI  Physical Exam:         Incision:  doppler signal intact right PT and DP  Extremities:  edema and intact motor Groin is healing well    Assessment/Plan:  This is a 57 y.o. male who is s/p Redo exposure of right common femoral artery and right below-knee popliteal artery Right femoral to below-knee popliteal artery bypass with 6 mm PTFE.  He has history of right femoral to below knee popliteal bypass with PTFE by Dr. [name] on 12/25/19.     He states he does sit in a dependent position for long periods of time.  He tries to walk some but it increases the LE edema and pain.  He continues to smoke.  I gave a handout on elevation and asked him to consider smoking cessation.  I refilled his percocet # 30 for the last time.  He will f/u  for duplex and ABI studies.     Mosetta Pigeon PA-C [facility] [phone]   Call MD:  Chestine Spore

## 2022-10-15 ENCOUNTER — Other Ambulatory Visit: Payer: Self-pay

## 2022-10-15 DIAGNOSIS — I739 Peripheral vascular disease, unspecified: Secondary | ICD-10-CM

## 2022-10-29 ENCOUNTER — Emergency Department (HOSPITAL_COMMUNITY)
Admission: EM | Admit: 2022-10-29 | Discharge: 2022-10-29 | Disposition: A | Discharge status: Home / Self Care | Payer: 59 | Attending: Emergency Medicine | Admitting: Emergency Medicine

## 2022-10-29 ENCOUNTER — Encounter (HOSPITAL_COMMUNITY): Payer: Self-pay

## 2022-10-29 ENCOUNTER — Other Ambulatory Visit: Payer: Self-pay

## 2022-10-29 ENCOUNTER — Emergency Department (HOSPITAL_COMMUNITY): Payer: 59

## 2022-10-29 ENCOUNTER — Telehealth: Payer: Self-pay

## 2022-10-29 DIAGNOSIS — Z79899 Other long term (current) drug therapy: Secondary | ICD-10-CM | POA: Diagnosis not present

## 2022-10-29 DIAGNOSIS — M79604 Pain in right leg: Secondary | ICD-10-CM

## 2022-10-29 DIAGNOSIS — Z7901 Long term (current) use of anticoagulants: Secondary | ICD-10-CM | POA: Diagnosis not present

## 2022-10-29 DIAGNOSIS — Z7902 Long term (current) use of antithrombotics/antiplatelets: Secondary | ICD-10-CM | POA: Diagnosis not present

## 2022-10-29 DIAGNOSIS — R600 Localized edema: Secondary | ICD-10-CM | POA: Diagnosis not present

## 2022-10-29 DIAGNOSIS — M7989 Other specified soft tissue disorders: Secondary | ICD-10-CM

## 2022-10-29 DIAGNOSIS — I10 Essential (primary) hypertension: Secondary | ICD-10-CM | POA: Diagnosis not present

## 2022-10-29 DIAGNOSIS — Z7982 Long term (current) use of aspirin: Secondary | ICD-10-CM | POA: Diagnosis not present

## 2022-10-29 LAB — CBC WITH DIFFERENTIAL/PLATELET
Abs Immature Granulocytes: 0.01 10*3/uL (ref 0.00–0.07)
Basophils Absolute: 0 10*3/uL (ref 0.0–0.1)
Basophils Relative: 1 %
Eosinophils Absolute: 0.1 10*3/uL (ref 0.0–0.5)
Eosinophils Relative: 1 %
HCT: 37.7 % — ABNORMAL LOW (ref 39.0–52.0)
Hemoglobin: 12.3 g/dL — ABNORMAL LOW (ref 13.0–17.0)
Immature Granulocytes: 0 %
Lymphocytes Relative: 36 %
Lymphs Abs: 3 10*3/uL (ref 0.7–4.0)
MCH: 31.9 pg (ref 26.0–34.0)
MCHC: 32.6 g/dL (ref 30.0–36.0)
MCV: 97.7 fL (ref 80.0–100.0)
Monocytes Absolute: 0.8 10*3/uL (ref 0.1–1.0)
Monocytes Relative: 9 %
Neutro Abs: 4.4 10*3/uL (ref 1.7–7.7)
Neutrophils Relative %: 53 %
Platelets: 266 10*3/uL (ref 150–400)
RBC: 3.86 MIL/uL — ABNORMAL LOW (ref 4.22–5.81)
RDW: 14.8 % (ref 11.5–15.5)
WBC: 8.3 10*3/uL (ref 4.0–10.5)
nRBC: 0 % (ref 0.0–0.2)

## 2022-10-29 LAB — BASIC METABOLIC PANEL
Anion gap: 10 (ref 5–15)
BUN: 9 mg/dL (ref 6–20)
CO2: 26 mmol/L (ref 22–32)
Calcium: 9.6 mg/dL (ref 8.9–10.3)
Chloride: 103 mmol/L (ref 98–111)
Creatinine, Ser: 1.58 mg/dL — ABNORMAL HIGH (ref 0.61–1.24)
GFR, Estimated: 51 mL/min — ABNORMAL LOW (ref >60)
Glucose, Bld: 87 mg/dL (ref 70–99)
Potassium: 3.7 mmol/L (ref 3.5–5.1)
Sodium: 139 mmol/L (ref 135–145)

## 2022-10-29 LAB — TROPONIN I (HIGH SENSITIVITY): Troponin I (High Sensitivity): 18 ng/L — ABNORMAL HIGH (ref <18)

## 2022-10-29 MED ORDER — OXYCODONE-ACETAMINOPHEN 5-325 MG PO TABS
2.0000 | ORAL_TABLET | Freq: Once | ORAL | Status: AC
  Administered 2022-10-29: 2 via ORAL
  Filled 2022-10-29: qty 2

## 2022-10-29 MED ORDER — OXYCODONE-ACETAMINOPHEN 7.5-325 MG PO TABS
1.0000 | ORAL_TABLET | Freq: Four times a day (QID) | ORAL | 0 refills | Status: DC | PRN
Start: 2022-10-29 — End: 2022-12-07

## 2022-10-29 NOTE — ED Notes (Signed)
Patient provided meal and requested assistance getting home. RN reaching out for information about how to assist patient with same.

## 2022-10-29 NOTE — ED Provider Notes (Signed)
[place] EMERGENCY DEPARTMENT AT [facility] Provider Note   CSN: [id number] Arrival date & time: 10/29/22  1634     History  Chief Complaint  Patient presents with   Leg Pain    Benjamin Espinoza is a 58 y.o. male.  Pt is a 56y/o male withhx of HTN, MI, PVD s/p Redo exposure of right common femoral artery and right below-knee popliteal artery Right femoral to below-knee popliteal artery bypass with 6 mm PTFE on 09/21/22 who continues to smoke tobacco and has had chronic pain since surgery but reports it is different than his claudication pain he had before surgery.  His biggest complaint now is pain in his bottom of his foot and top of his foot that is much worse when he tries to walk and he reports the swelling had gone down completely after surgery and then came back a few weeks ago when he started walking more.  He reports now it is very painful for him to walk at all and even when he elevates the foot the swelling does not really go down.  He has not had fever and has not noticed that his incisions are draining or open.  The pain is better if he is not walking.  He went to a pain management doctor today at [facility] and they told him he was too soon after surgery and needed to follow-up with Dr. Durwin Nora who did the surgery.  He went to the office but they did not have an appointment today and recommended that if he was having severe pain he would need to go to the emergency room but they did make an appointment for him next week.  They also would not refill his pain medication until he was seen.  Patient did not feel like he could wait throughout the weekend especially with the pain getting worse.  He has been taking Xarelto but does not always take it with food.  He does continue to smoke but keeps saying he knows he needs to quit.  He has no groin pain or abdominal pain.  He reports when the pain in his leg got really bad he had some chest tightness and shortness of breath but  that has gone away.  The history is provided by the patient and medical records.  Leg Pain      Home Medications Prior to Admission medications   Medication Sig Start Date End Date Taking? Authorizing Provider  oxyCODONE-acetaminophen (PERCOCET) 7.5-325 MG tablet Take 1 tablet by mouth every 6 (six) hours as needed for severe pain. 10/29/22  Yes Queen Abbett, Alphonzo Lemmings, MD  amLODipine (NORVASC) 5 MG tablet TAKE 1 TABLET(5 MG) BY MOUTH DAILY 05/21/22   Tobb, Kardie, DO  aspirin EC 81 MG tablet Take 1 tablet (81 mg total) by mouth daily. 09/23/22   Rhyne, Ames Coupe, PA-C  atenolol (TENORMIN) 50 MG tablet Take 50 mg by mouth daily. 04/01/22   [provider]  atorvastatin (LIPITOR) 80 MG tablet Take 1 tablet (80 mg total) by mouth daily. 04/09/21   Tobb, Kardie, DO  cilostazol (PLETAL) 100 MG tablet Take 100 mg by mouth 2 (two) times daily. 04/12/22   [provider]  clopidogrel (PLAVIX) 75 MG tablet TAKE 1 TABLET(75 MG) BY MOUTH DAILY Patient not taking: Reported on 09/06/2022 05/05/22   Tobb, Kardie, DO  losartan (COZAAR) 25 MG tablet TAKE 1 TABLET(25 MG) BY MOUTH DAILY Patient taking differently: Take 25 mg by mouth daily. 04/09/21   Tobb,  Lavona Mound, DO  rivaroxaban (XARELTO) 20 MG TABS tablet Take 1 tablet (20 mg total) by mouth daily with lunch. 09/23/22   Rhyne, Ames Coupe, PA-C      Allergies    Imdur [isosorbide nitrate]    Review of Systems   Review of Systems  Physical Exam Updated Vital Signs BP (!) 166/101   Pulse 71   Temp 98.7 F (37.1 C) (Oral)   Resp 14   SpO2 97%  Physical Exam Vitals and nursing note reviewed.  Constitutional:      General: He is not in acute distress.    Appearance: He is well-developed.  HENT:     Head: Normocephalic and atraumatic.  Eyes:     Conjunctiva/sclera: Conjunctivae normal.     Pupils: Pupils are equal, round, and reactive to light.  Cardiovascular:     Rate and Rhythm: Normal rate and regular rhythm.     Heart sounds: Murmur  heard.     Comments: 2 out of 6 systolic murmur heard best at the left sternal border Pulmonary:     Effort: Pulmonary effort is normal. No respiratory distress.     Breath sounds: Normal breath sounds. No wheezing or rales.  Abdominal:     General: There is no distension.     Palpations: Abdomen is soft.     Tenderness: There is no abdominal tenderness. There is no guarding or rebound.  Musculoskeletal:        General: Tenderness present. Normal range of motion.     Cervical back: Normal range of motion and neck supple.     Right lower leg: Edema present.     Comments: 2+ edema in the right lower leg from the mid shin down.  DP and PT pulses dopplered with good biphasic sounds.  Leg is warm compared to the left leg.  Mild tenderness behind the knee and in the proximal right calf area.  Groin incision well-healed with no tenderness and palpable femoral pulse.  Medial tib-fib incision is well-healed without openings or drainage.  No tenderness over the incision site.  Significant tenderness over the dorsal and plantar surface of the right foot.  Skin:    General: Skin is warm and dry.     Findings: No erythema or rash.  Neurological:     Mental Status: He is alert and oriented to person, place, and time.  Psychiatric:        Behavior: Behavior normal.     ED Results / Procedures / Treatments   Labs (all labs ordered are listed, but only abnormal results are displayed) Labs Reviewed  CBC WITH DIFFERENTIAL/PLATELET - Abnormal; Notable for the following components:      Result Value   RBC 3.86 (*)    Hemoglobin 12.3 (*)    HCT 37.7 (*)    All other components within normal limits  BASIC METABOLIC PANEL - Abnormal; Notable for the following components:   Creatinine, Ser 1.58 (*)    GFR, Estimated 51 (*)    All other components within normal limits  TROPONIN I (HIGH SENSITIVITY) - Abnormal; Notable for the following components:   Troponin I (High Sensitivity) 18 (*)    All other  components within normal limits    EKG EKG Interpretation Date/Time:  Friday October 29 2022 16:45:29 EDT Ventricular Rate:  72 PR Interval:  152 QRS Duration:  102 QT Interval:  428 QTC Calculation: 469 R Axis:   96  Text Interpretation: Sinus rhythm Borderline right axis deviation  Probable left ventricular hypertrophy No significant change since last tracing Confirmed by Gwyneth Sprout ([id number]) on 10/29/2022 5:24:52 PM  Radiology VAS [place] LOWER EXTREMITY VENOUS (DVT) (7a-7p)  Result Date: 10/29/2022  Lower Venous DVT Study Patient Name:  Benjamin Espinoza  Date of Exam:   10/29/2022 Medical Rec #: [id number]           Accession #:    [id number] Date of Birth: 08/21/1965           Patient Gender: M Patient Age:   58 years Exam Location:  [facility] Procedure:      VAS [place] LOWER EXTREMITY VENOUS (DVT) Referring Phys: Gwyneth Sprout --------------------------------------------------------------------------------  Indications: New onset pain, swelling, warmth in patient with recent arterial bypass graft surgery.  Performing Technologist: Jean Rosenthal RDMS, RVT  Examination Guidelines: A complete evaluation includes B-mode imaging, spectral Doppler, color Doppler, and power Doppler as needed of all accessible portions of each vessel. Bilateral testing is considered an integral part of a complete examination. Limited examinations for reoccurring indications may be performed as noted. The reflux portion of the exam is performed with the patient in reverse Trendelenburg.  +---------+---------------+---------+-----------+----------+--------------+ RIGHT    CompressibilityPhasicitySpontaneityPropertiesThrombus Aging +---------+---------------+---------+-----------+----------+--------------+ CFV      Full           Yes      Yes                                 +---------+---------------+---------+-----------+----------+--------------+ SFJ      Full                                                         +---------+---------------+---------+-----------+----------+--------------+ FV Prox  Full                                                        +---------+---------------+---------+-----------+----------+--------------+ FV Mid   Full                                                        +---------+---------------+---------+-----------+----------+--------------+ FV DistalFull           Yes      Yes                                 +---------+---------------+---------+-----------+----------+--------------+ PFV      Full                                                        +---------+---------------+---------+-----------+----------+--------------+ POP      Full           Yes      Yes                                 +---------+---------------+---------+-----------+----------+--------------+  PTV      Full                                                        +---------+---------------+---------+-----------+----------+--------------+ PERO     Full                                                        +---------+---------------+---------+-----------+----------+--------------+   +----+---------------+---------+-----------+----------+--------------+ LEFTCompressibilityPhasicitySpontaneityPropertiesThrombus Aging +----+---------------+---------+-----------+----------+--------------+ CFV Full           Yes      Yes                                 +----+---------------+---------+-----------+----------+--------------+     Summary: RIGHT: - There is no evidence of deep vein thrombosis in the lower extremity.  - No cystic structure found in the popliteal fossa.  - Bypass graft is noted to be patent by color.  LEFT: - No evidence of common femoral vein obstruction.   *See table(s) above for measurements and observations. Electronically signed by Lemar Livings MD on 10/29/2022 at 7:09:12 PM.    Final    DG Foot Complete Right  Result Date:  10/29/2022 CLINICAL DATA:  Leg and foot pain and swelling EXAM: RIGHT FOOT COMPLETE - 3+ VIEW COMPARISON:  None Available. FINDINGS: There is no evidence of fracture or dislocation. There is no evidence of arthropathy or other focal bone abnormality. Soft tissues are radiographically unremarkable. IMPRESSION: No acute fracture or dislocation. Electronically Signed   By: Minerva Fester M.D.   On: 10/29/2022 18:26    Procedures Procedures    Medications Ordered in ED Medications  oxyCODONE-acetaminophen (PERCOCET/ROXICET) 5-325 MG per tablet 2 tablet (2 tablets Oral Given 10/29/22 1737)    ED Course/ Medical Decision Making/ A&P                                 Medical Decision Making Amount and/or Complexity of Data Reviewed Labs: ordered. Decision-making details documented in ED Course. Radiology: ordered and independent interpretation performed. Decision-making details documented in ED Course.  Risk Prescription drug management.   Pt with multiple medical problems and comorbidities and presenting today with a complaint that caries a high risk for morbidity and mortality.  Patient here today with persistent leg pain.  Seems that patient swelling in his leg and pain with walking has been present now for several weeks but the swelling is gotten a bit worse.  Unclear if the pain is gotten worse but he has run out of his pain medication and could not get it refilled until he is seen by vascular and they did not have an appointment today.  Patient's pulses are intact and low suspicion for graft abnormality.  Concern for possible DVT versus fracture versus venous stasis and pain postsurgery.  Patient given pain control here.  Labs are pending.  Patient also reported that when the pain became bad he had a short episode of chest pain and his blood pressure was high but denies having any pain at this time.  I independently interpreted patient's EKG which showed no acute changes today.  Patient is  hypertensive here but will treat his pain and reassess as it is coming down spontaneously.  He has been compliant with his blood pressure medication.  7:20 PM I have independently visualized and interpreted pt's images today.  Foot images are negative for acute fracture.  Ultrasound is negative for DVT.  I independently interpreted patient's labs and troponin is 18 which appears to be patient's baseline for years now, CBC within normal limits and BMP with mild bump in creatinine of 1.58 but otherwise no acute findings.  Findings discussed with the patient.  Will attempt to get him a compression sock and encouraged him to continue to elevate when he is sitting down.  He has an appointment on Thursday of next week.  Patient given a short course of pain control.  Low suspicion for ACS today or PE.          Final Clinical Impression(s) / ED Diagnoses Final diagnoses:  Right leg pain  Edema of right lower leg    Rx / DC Orders ED Discharge Orders          Ordered    oxyCODONE-acetaminophen (PERCOCET) 7.5-325 MG tablet  Every 6 hours PRN        10/29/22 1918              Gwyneth Sprout, MD 10/29/22 1920

## 2022-10-29 NOTE — ED Triage Notes (Signed)
[facility] BIB [facility] from home for complications from bypass surgery on his right leg. Patient has notably hot, swollen right leg and pain 10/10 to same. Surgery was in July at this facility. Patient reports uncontrolled pain since medications ran out as well. VSS, A&Ox4 at this time.

## 2022-10-29 NOTE — Discharge Instructions (Addendum)
Wear compression sock on the right leg and elevate when you are sitting to help with the swelling.  There is no sign of blood clots today.  Continue to take your Xarelto but make sure you are taking it with food

## 2022-10-29 NOTE — Progress Notes (Signed)
Lower extremity venous right study completed.  Preliminary results relayed to Maryan Rued, MD.  See CV Proc for preliminary results report.   Darlin Coco, RDMS, RVT

## 2022-10-29 NOTE — Telephone Encounter (Signed)
Pt called stating that he was at his orthopedic doctor's office and they wouldn't do anything for him. He is having severe pain in his leg and bottom of foot and is requesting more pain meds.  Multiple attempts at phone conversation with dropped calls. Pt stated that he had swelling, which elevation wasn't really helping. He denies coldness or discoloration.  Due to continued dropped calls, pt walked into clinic, filled out triage form, form given to triage RN.  Pt states that the pain is fairly consistent with what he's been experiencing since surgery. He raised his pant leg and showed me the well healed incision and the swelling of his R foot and leg. Informed him that [place], [place] had given her last refill of pain meds on 8/15, so he would have to be seen. There were no available appts, so if he was in severe pain with ischemic symptoms, he would have to go to ED. Since his pain hadn't changed, it didn't warrant an [place]. Explained to him why and he then stated that Marisue Humble told him to walk as much as possible. So he noticed an increase in pain the next day after doing that. He pushed himself too hard and is now having symptoms, but not expressively severe. Scheduled pt appt for next available next week and instructed him to go to ED if severe. Confirmed understanding.

## 2022-11-04 ENCOUNTER — Ambulatory Visit (HOSPITAL_COMMUNITY): Payer: 59 | Attending: Vascular Surgery

## 2022-11-05 ENCOUNTER — Telehealth: Payer: Self-pay

## 2022-11-05 NOTE — Telephone Encounter (Signed)
Pt's HH Physical Therapist, [facility] called to let us know pt is having issues with walking still and have ankle pain for which he is going to urgent care for today. Verbal order given to extend PT for 4 weeks (once/weekly). Pt also had elevated bp today after smoking a cigarette and before taking his b/p meds. It was 180/100. Benjamin Espinoza stated it had already started to go down by the end of their visit. No further questions/concerns at this time.

## 2022-11-11 ENCOUNTER — Telehealth: Payer: Self-pay

## 2022-11-11 ENCOUNTER — Emergency Department
Admit: 2022-11-11 | Discharge: 2022-11-11 | Disposition: A | Discharge status: Home / Self Care | Payer: MEDICARE | Attending: Student in an Organized Health Care Education/Training Program

## 2022-11-11 ENCOUNTER — Ambulatory Visit
Admit: 2022-11-11 | Discharge: 2022-11-11 | Disposition: A | Discharge status: Home / Self Care | Payer: MEDICARE | Attending: Student in an Organized Health Care Education/Training Program

## 2022-11-11 DIAGNOSIS — R0781 Pleurodynia: Principal | ICD-10-CM

## 2022-11-11 DIAGNOSIS — M79604 Pain in right leg: Principal | ICD-10-CM

## 2022-11-11 DIAGNOSIS — I110 Hypertensive heart disease with heart failure: Secondary | ICD-10-CM | POA: Diagnosis not present

## 2022-11-11 DIAGNOSIS — F17210 Nicotine dependence, cigarettes, uncomplicated: Secondary | ICD-10-CM | POA: Diagnosis not present

## 2022-11-11 DIAGNOSIS — F1299 Cannabis use, unspecified with unspecified cannabis-induced disorder: Secondary | ICD-10-CM | POA: Diagnosis not present

## 2022-11-11 DIAGNOSIS — Z7982 Long term (current) use of aspirin: Secondary | ICD-10-CM | POA: Diagnosis not present

## 2022-11-11 DIAGNOSIS — R404 Transient alteration of awareness: Secondary | ICD-10-CM | POA: Diagnosis not present

## 2022-11-11 DIAGNOSIS — R7989 Other specified abnormal findings of blood chemistry: Secondary | ICD-10-CM | POA: Diagnosis not present

## 2022-11-11 DIAGNOSIS — I509 Heart failure, unspecified: Secondary | ICD-10-CM | POA: Diagnosis not present

## 2022-11-11 DIAGNOSIS — I2510 Atherosclerotic heart disease of native coronary artery without angina pectoris: Secondary | ICD-10-CM | POA: Diagnosis not present

## 2022-11-11 DIAGNOSIS — Z79899 Other long term (current) drug therapy: Secondary | ICD-10-CM | POA: Diagnosis not present

## 2022-11-11 DIAGNOSIS — G4489 Other headache syndrome: Secondary | ICD-10-CM | POA: Diagnosis not present

## 2022-11-11 DIAGNOSIS — R6889 Other general symptoms and signs: Secondary | ICD-10-CM | POA: Diagnosis not present

## 2022-11-11 DIAGNOSIS — E785 Hyperlipidemia, unspecified: Secondary | ICD-10-CM | POA: Diagnosis not present

## 2022-11-11 DIAGNOSIS — R079 Chest pain, unspecified: Secondary | ICD-10-CM | POA: Diagnosis not present

## 2022-11-11 DIAGNOSIS — Z7901 Long term (current) use of anticoagulants: Secondary | ICD-10-CM | POA: Diagnosis not present

## 2022-11-11 MED ORDER — OXYCODONE 5 MG TABLET
ORAL_TABLET | Freq: Three times a day (TID) | ORAL | 0 refills | 3 days | Status: CP | PRN
Start: 2022-11-11 — End: 2022-11-16

## 2022-11-11 NOTE — Telephone Encounter (Signed)
Pt called reporting that he is currently in [facility] with two blockages.  Reviewed pt's chart, returned call for clarification, two identifiers used. Pt is still in hospital waiting for more testing and results. Considering the new blockages, asked pt if he'd missed any doses of his Xarelto. He stated that he had run out about a week ago. Instructed him to call our office back once a plan was in place for him there or when he is d/c'd, so if an appt is needed, it can be scheduled. Confirmed understanding.

## 2022-11-15 DIAGNOSIS — I10 Essential (primary) hypertension: Secondary | ICD-10-CM | POA: Diagnosis not present

## 2022-11-15 DIAGNOSIS — Z951 Presence of aortocoronary bypass graft: Secondary | ICD-10-CM | POA: Diagnosis not present

## 2022-11-15 DIAGNOSIS — I509 Heart failure, unspecified: Secondary | ICD-10-CM | POA: Diagnosis not present

## 2022-11-15 DIAGNOSIS — Z6826 Body mass index (BMI) 26.0-26.9, adult: Secondary | ICD-10-CM | POA: Diagnosis not present

## 2022-11-15 DIAGNOSIS — R030 Elevated blood-pressure reading, without diagnosis of hypertension: Secondary | ICD-10-CM | POA: Diagnosis not present

## 2022-11-15 DIAGNOSIS — I2510 Atherosclerotic heart disease of native coronary artery without angina pectoris: Secondary | ICD-10-CM | POA: Diagnosis not present

## 2022-11-16 ENCOUNTER — Telehealth: Payer: Self-pay | Admitting: Vascular Surgery

## 2022-11-17 NOTE — Telephone Encounter (Signed)
Pt appt scheduled

## 2022-11-18 ENCOUNTER — Telehealth: Payer: Self-pay

## 2022-11-18 NOTE — Telephone Encounter (Signed)
Benjamin Espinoza, PT with [facility] called stating that during therapy, the pt was reporting a 9/10 pain.   Reviewed pt's chart, returned call for clarification, two identifiers used. Informed her that at the pt's last office visit on 10/15/22, the PA would not prescribe any further pain med refills. Pt would have to either be seen again, get a PCP, or go to the ED. Offered to send a pain management referral. She accepted stating that it needed to be somewhere close to him d/t transportation issues.  Referral sent to pain management in [facility].

## 2022-11-20 DIAGNOSIS — R0781 Pleurodynia: Secondary | ICD-10-CM | POA: Diagnosis not present

## 2022-11-29 DIAGNOSIS — F17210 Nicotine dependence, cigarettes, uncomplicated: Secondary | ICD-10-CM | POA: Diagnosis not present

## 2022-11-29 DIAGNOSIS — Z5982 Transportation insecurity: Secondary | ICD-10-CM | POA: Diagnosis not present

## 2022-11-29 DIAGNOSIS — E785 Hyperlipidemia, unspecified: Secondary | ICD-10-CM | POA: Diagnosis not present

## 2022-11-29 DIAGNOSIS — Z95820 Peripheral vascular angioplasty status with implants and grafts: Secondary | ICD-10-CM | POA: Diagnosis not present

## 2022-11-29 DIAGNOSIS — I70219 Atherosclerosis of native arteries of extremities with intermittent claudication, unspecified extremity: Secondary | ICD-10-CM | POA: Diagnosis not present

## 2022-11-29 DIAGNOSIS — I509 Heart failure, unspecified: Secondary | ICD-10-CM | POA: Diagnosis not present

## 2022-11-29 DIAGNOSIS — G629 Polyneuropathy, unspecified: Secondary | ICD-10-CM | POA: Diagnosis not present

## 2022-11-29 DIAGNOSIS — F1220 Cannabis dependence, uncomplicated: Secondary | ICD-10-CM | POA: Diagnosis not present

## 2022-11-29 DIAGNOSIS — I110 Hypertensive heart disease with heart failure: Secondary | ICD-10-CM | POA: Diagnosis not present

## 2022-11-29 DIAGNOSIS — I252 Old myocardial infarction: Secondary | ICD-10-CM | POA: Diagnosis not present

## 2022-11-29 DIAGNOSIS — I2510 Atherosclerotic heart disease of native coronary artery without angina pectoris: Secondary | ICD-10-CM | POA: Diagnosis not present

## 2022-11-29 DIAGNOSIS — Z8249 Family history of ischemic heart disease and other diseases of the circulatory system: Secondary | ICD-10-CM | POA: Diagnosis not present

## 2022-12-06 ENCOUNTER — Ambulatory Visit: Payer: 59

## 2022-12-06 ENCOUNTER — Ambulatory Visit (HOSPITAL_COMMUNITY): Payer: Medicare HMO

## 2022-12-07 ENCOUNTER — Ambulatory Visit (INDEPENDENT_AMBULATORY_CARE_PROVIDER_SITE_OTHER): Payer: Medicare HMO | Admitting: Podiatry

## 2022-12-07 ENCOUNTER — Encounter: Payer: Self-pay | Admitting: Podiatry

## 2022-12-07 ENCOUNTER — Ambulatory Visit (INDEPENDENT_AMBULATORY_CARE_PROVIDER_SITE_OTHER): Payer: Medicare HMO

## 2022-12-07 DIAGNOSIS — G8929 Other chronic pain: Secondary | ICD-10-CM

## 2022-12-07 DIAGNOSIS — M7751 Other enthesopathy of right foot: Secondary | ICD-10-CM

## 2022-12-07 DIAGNOSIS — Z7689 Persons encountering health services in other specified circumstances: Secondary | ICD-10-CM | POA: Diagnosis not present

## 2022-12-07 MED ORDER — OXYCODONE-ACETAMINOPHEN 7.5-325 MG PO TABS: 1.0000 | ORAL_TABLET | Freq: Four times a day (QID) | ORAL | 0 refills | Status: DC | PRN

## 2022-12-07 MED ORDER — GABAPENTIN 300 MG PO CAPS: 300.0000 mg | ORAL_CAPSULE | Freq: Every day | ORAL | 0 refills | Status: DC

## 2022-12-07 NOTE — Progress Notes (Signed)
Subjective:  Patient ID: Benjamin Espinoza, male    DOB: 07/27/1965,  MRN: [id number]  Chief Complaint  Patient presents with   Ankle Pain    Lateral ankle and dorsal foot right - had vascular surgery lower extremity on September 21, 2022, Dr. 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  History of Present Illness         The patient, with a history of lower extremity bypass surgery, presents with persistent foot pain and swelling since the operation on July 23rd. The pain is significant and is located around the ankle, extending downwards. The patient reports an incident where the ankle gave out, which has been a recurring issue. The pain has worsened since the bypass surgery. The patient has been seeking medical attention from various sources including emergency rooms and their previous doctor, Dr. Durwin Nora.  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 The patient is concerned about whether the current foot pain and swelling could be related to arthritis. The patient has been struggling to find a primary care doctor and a pain management specialist within their insurance network.  The patient has been trying to manage the pain with oxycodone 7.5mg  but is seeking a long-term solution rather than relying on pain medication. The patient also mentions having an open heart surgery in the past and is currently on cilostazol and carvedilol for heart-related issues.     Objective:    Physical Exam         General: AAO x3, NAD  Dermatological: Skin is warm, dry and supple bilateral.  There are  no open sores, no preulcerative lesions, no rash or signs of infection present.  Vascular: DP pulse palpable, PT pulse not palpable.  Foot appears to be of equal temperature gradient compared to contralateral extremity.  Neruologic: Grossly intact via light touch bilateral.  Musculoskeletal: There is diffuse tenderness along the foot.  The majority tenderness that was localized along the sinus Tarsi, lateral ankle complex.  Unable to appreciate any area of pinpoint tenderness.  Flexor, extensor tendons.  Intact.  There is edema to the foot there is no erythema or warmth.  Gait: Unassisted, Nonantalgic.    No images are attached to the encounter.    Results          Assessment:   1. Capsulitis of ankle, right   2. Other chronic pain   3. Encounter to establish care      Plan:  Patient was evaluated and treated and all questions answered.  Assessment and Plan         Postoperative Pain and Swelling, history of recent Lower Extremity Bypass -Order MRI to better visualize soft tissues and assess for other potential causes of pain. -Resume Gabapentin 300mg  at night for potential nerve pain. -Recommend to continue to follow-up with his vascular surgeon for follow-up to ensure adequate circulation.  He canceled his appointment yesterday but encouraged him to reschedule this. -Given circulation we will hold off on steroid injection. -Did prescribe pain medicine today but long-term needs to go to pain management. -Refer  to pain clinic for further pain management. -Refer to primary care doctor for overall health management.  Arthritis Mild arthritis noted on x-ray of the ankle joint. -Continue current management plan.  General Health Maintenance -Ensure patient follows up with vascular surgeon, pain clinic, and primary care doctor as referred. -Check MRI results once available and discuss further management plan based on findings.  Radiology: 3 views of the ankle and 2 views  of the foot were obtained.  No evidence of acute fracture.  Some mild arthritic changes are present.  There is no evidence of acute fracture.  Return in about 2 months (around 02/06/2023).

## 2022-12-13 ENCOUNTER — Telehealth: Payer: Self-pay | Admitting: Podiatry

## 2022-12-13 NOTE — Telephone Encounter (Signed)
Patient called saw Dr Ardelle Anton and was referred to Primary Care and a Pain Management doctor and the pt is calling to let us know that he has not heard from them. He wanted to know who they were so he could follow up. [phone].

## 2022-12-21 ENCOUNTER — Other Ambulatory Visit: Payer: Self-pay | Admitting: Podiatry

## 2022-12-21 NOTE — Progress Notes (Signed)
error 

## 2022-12-22 ENCOUNTER — Telehealth: Payer: Self-pay | Admitting: Podiatry

## 2022-12-22 NOTE — Telephone Encounter (Signed)
Notified pt and gave him the number for the pain management. He said thank you

## 2022-12-22 NOTE — Telephone Encounter (Signed)
Pt called upset stating he was to have been referred to a pcp and a pain management and the original pain management denied pt. It looks like there was a new referral faxed to [facility] medical pain management. I was not sure which one it was sent to but he needs it to go to [employer].   I did give him the number to the [place] pcp so he could call them and get scheduled.  He said thank you that I have helped him a lot. Please advise which [facility] it went too so I can let pt know.

## 2022-12-23 ENCOUNTER — Encounter (HOSPITAL_COMMUNITY): Payer: 59

## 2022-12-23 ENCOUNTER — Other Ambulatory Visit (HOSPITAL_COMMUNITY): Payer: 59

## 2022-12-23 ENCOUNTER — Ambulatory Visit: Payer: 59

## 2022-12-27 DIAGNOSIS — I10 Essential (primary) hypertension: Secondary | ICD-10-CM | POA: Diagnosis not present

## 2022-12-27 DIAGNOSIS — I739 Peripheral vascular disease, unspecified: Secondary | ICD-10-CM | POA: Diagnosis not present

## 2022-12-27 DIAGNOSIS — R079 Chest pain, unspecified: Secondary | ICD-10-CM | POA: Diagnosis not present

## 2022-12-27 DIAGNOSIS — Z951 Presence of aortocoronary bypass graft: Secondary | ICD-10-CM | POA: Diagnosis not present

## 2022-12-27 DIAGNOSIS — I2510 Atherosclerotic heart disease of native coronary artery without angina pectoris: Secondary | ICD-10-CM | POA: Diagnosis not present

## 2022-12-31 ENCOUNTER — Ambulatory Visit
Admission: RE | Admit: 2022-12-31 | Discharge: 2022-12-31 | Disposition: A | Discharge status: Home / Self Care | Payer: Medicare HMO | Source: Ambulatory Visit | Attending: Podiatry | Admitting: Podiatry

## 2022-12-31 DIAGNOSIS — G8929 Other chronic pain: Secondary | ICD-10-CM | POA: Diagnosis not present

## 2022-12-31 DIAGNOSIS — M25571 Pain in right ankle and joints of right foot: Secondary | ICD-10-CM | POA: Diagnosis not present

## 2022-12-31 DIAGNOSIS — M7751 Other enthesopathy of right foot: Secondary | ICD-10-CM

## 2022-12-31 DIAGNOSIS — M19071 Primary osteoarthritis, right ankle and foot: Secondary | ICD-10-CM | POA: Diagnosis not present

## 2023-01-10 ENCOUNTER — Encounter: Payer: Self-pay | Admitting: Internal Medicine

## 2023-01-10 ENCOUNTER — Ambulatory Visit (INDEPENDENT_AMBULATORY_CARE_PROVIDER_SITE_OTHER): Payer: Medicare HMO | Admitting: Internal Medicine

## 2023-01-10 VITALS — BP 140/92 | HR 70 | Temp 98.9°F | Ht 70.0 in | Wt 205.0 lb

## 2023-01-10 DIAGNOSIS — I25708 Atherosclerosis of coronary artery bypass graft(s), unspecified, with other forms of angina pectoris: Secondary | ICD-10-CM

## 2023-01-10 DIAGNOSIS — Z1159 Encounter for screening for other viral diseases: Secondary | ICD-10-CM

## 2023-01-10 DIAGNOSIS — G8929 Other chronic pain: Secondary | ICD-10-CM

## 2023-01-10 DIAGNOSIS — Z1211 Encounter for screening for malignant neoplasm of colon: Secondary | ICD-10-CM

## 2023-01-10 DIAGNOSIS — M545 Low back pain, unspecified: Secondary | ICD-10-CM | POA: Diagnosis not present

## 2023-01-10 DIAGNOSIS — I10 Essential (primary) hypertension: Secondary | ICD-10-CM | POA: Diagnosis not present

## 2023-01-10 DIAGNOSIS — E782 Mixed hyperlipidemia: Secondary | ICD-10-CM | POA: Diagnosis not present

## 2023-01-10 DIAGNOSIS — Z029 Encounter for administrative examinations, unspecified: Secondary | ICD-10-CM | POA: Insufficient documentation

## 2023-01-10 DIAGNOSIS — Z5181 Encounter for therapeutic drug level monitoring: Secondary | ICD-10-CM | POA: Insufficient documentation

## 2023-01-10 DIAGNOSIS — N3281 Overactive bladder: Secondary | ICD-10-CM | POA: Diagnosis not present

## 2023-01-10 DIAGNOSIS — I739 Peripheral vascular disease, unspecified: Secondary | ICD-10-CM

## 2023-01-10 DIAGNOSIS — F17200 Nicotine dependence, unspecified, uncomplicated: Secondary | ICD-10-CM

## 2023-01-10 DIAGNOSIS — Z0001 Encounter for general adult medical examination with abnormal findings: Secondary | ICD-10-CM

## 2023-01-10 HISTORY — DX: Encounter for therapeutic drug level monitoring: Z51.81

## 2023-01-10 MED ORDER — CARVEDILOL 3.125 MG PO TABS: 3.1250 mg | ORAL_TABLET | Freq: Two times a day (BID) | ORAL | 11 refills | Status: DC

## 2023-01-10 MED ORDER — ATORVASTATIN CALCIUM 80 MG PO TABS: 80.0000 mg | ORAL_TABLET | Freq: Every day | ORAL | 11 refills | Status: DC

## 2023-01-10 MED ORDER — LOSARTAN POTASSIUM 50 MG PO TABS: 50.0000 mg | ORAL_TABLET | Freq: Every day | ORAL | 11 refills | Status: DC

## 2023-01-10 MED ORDER — RIVAROXABAN 20 MG PO TABS: 20.0000 mg | ORAL_TABLET | Freq: Every day | ORAL | 11 refills | Status: DC

## 2023-01-10 MED ORDER — CILOSTAZOL 100 MG PO TABS: 100.0000 mg | ORAL_TABLET | Freq: Two times a day (BID) | ORAL | 11 refills | Status: DC

## 2023-01-10 MED ORDER — OXYCODONE-ACETAMINOPHEN 7.5-325 MG PO TABS
1.0000 | ORAL_TABLET | Freq: Three times a day (TID) | ORAL | 0 refills | Status: DC | PRN
Start: 2023-01-10 — End: 2023-02-03

## 2023-01-10 MED ORDER — SOLIFENACIN SUCCINATE 5 MG PO TABS: 5.0000 mg | ORAL_TABLET | Freq: Every day | ORAL | 11 refills | Status: DC

## 2023-01-10 MED ORDER — RANOLAZINE ER 1000 MG PO TB12: 1000.0000 mg | ORAL_TABLET | Freq: Two times a day (BID) | ORAL | 11 refills | Status: DC

## 2023-01-10 NOTE — Assessment & Plan Note (Signed)
Now down to 3 cigs per day - trying to quit; pt counsled to quit

## 2023-01-10 NOTE — Patient Instructions (Addendum)
MyChart Username [id number]  Your PW is [id number]  Please take all new medication as prescribed - the Vesicare 5 mg for the bladder  Please stop smoking  Please continue all other medications as before, and refills have been done as requested - and let us know on the Mychart messaging when you are due for med refill;  remember I can only do 3 months of the percocet as you should be seeing pain management after that  Please have the pharmacy call with any other refills you may need.  Please continue your efforts at being more active, low cholesterol diet, and weight control.  You are otherwise up to date with prevention measures today.  Please keep your appointments with your specialists as you may have planned - cardiology and vascular surgury  You will be contacted regarding the referral for: colonoscopy  We can hold on lab testing today  Please make an Appointment to return in 6 months, or sooner if needed

## 2023-01-10 NOTE — Progress Notes (Unsigned)
Patient ID: Benjamin Espinoza, male   DOB: February 04, 1966, 57 y.o.   MRN: [id number]         Chief Complaint:: wellness exam and chronic pain, htn, OAB , PAD, smoker       HPI:  Benjamin Espinoza is a 57 y.o. male here for wellness exam; due for colonoscopy, due for hep c screen, o/w up to date                        Also saw cardiology x 2 wks ago.  May need LLE vascular surgury at some point.  S/p may 2024 RLE redo vascular bypass.  Pt continues to have recurring LBP without change in severity, bowel or bladder change, fever, wt loss,  worsening LE pain/numbness/weakness, gait change or falls - on SSI since 2017.  Has more recent chronic worsening right ankle pain, MRI results pending per ortho.  Out of all meds including pain.  Unable to see [facility] in [place] further as they no longer take his new insurance.  Needs bridging meds including xarelto as out of all for several months.  And pain management referral.  Also has months of urinary frequency and urgency but Denies urinary symptoms such as dysuria, flank pain, hematuria or n/v, fever, chills.  Still smoking not ready to quit. Does not want lab tests today   Wt Readings from Last 3 Encounters:  01/10/23 205 lb (93 kg)  10/14/22 198 lb 11.2 oz (90.1 kg)  09/21/22 203 lb 10.1 oz (92.4 kg)   BP Readings from Last 3 Encounters:  01/10/23 (!) 140/92  10/29/22 (!) 166/101  10/14/22 (!) 129/90   Immunization History  Administered Date(s) Administered   Unspecified SARS-COV-2 Vaccination 04/02/2019   Health Maintenance Due  Topic Date Due   Hepatitis C Screening  Never done   DTaP/Tdap/Td (1 - Tdap) Never done   Zoster Vaccines- Shingrix (1 of 2) Never done   Colonoscopy  Never done   COVID-19 Vaccine (2 - Mixed Product risk series) 04/30/2019   Medicare Annual Wellness (AWV)  11/07/2020   INFLUENZA VACCINE  Never done      Past Medical History:  Diagnosis Date   Abnormal stress test    Anxiety    Asthma    as a  child   Atypical chest pain 05/07/2017   Coronary artery disease    DDD (degenerative disc disease), lumbar 11/08/2019   Formatting of this note might be different from the original. 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 [place]   Hyperlipidemia    Myocardial infarction [facility]) 2017   PVD (peripheral vascular disease) (HCC) 09/21/2019   Shortness of breath    Tobacco use disorder    Past Surgical History:  Procedure Laterality Date   ABDOMINAL AORTOGRAM W/LOWER EXTREMITY Bilateral 09/21/2019   Procedure: ABDOMINAL AORTOGRAM W/LOWER EXTREMITY;  Surgeon: [name], MD;  Location: [facility] INVASIVE CV LAB;  Service: Cardiovascular;  Laterality: Bilateral;   ABDOMINAL AORTOGRAM W/LOWER EXTREMITY N/A 12/07/2019   Procedure: ABDOMINAL AORTOGRAM W/LOWER EXTREMITY;  Surgeon: [name], MD;  Location: [facility] INVASIVE CV LAB;  Service: Cardiovascular;  Laterality: N/A;   ABDOMINAL AORTOGRAM W/LOWER EXTREMITY N/A 09/09/2022   Procedure: ABDOMINAL AORTOGRAM W/LOWER EXTREMITY;  Surgeon: Cephus Shelling, MD;  Location: [facility] INVASIVE CV LAB;  Service: Vascular;  Laterality: N/A;   CARDIAC CATHETERIZATION     CORONARY ARTERY BYPASS GRAFT  quadruple bypas 2017   CORONARY STENT INTERVENTION N/A 11/26/2019   Procedure: CORONARY STENT INTERVENTION;  Surgeon: Yvonne Kendall, MD;  Location: [facility] INVASIVE CV LAB;  Service: Cardiovascular;  Laterality: N/A;   FEMORAL-POPLITEAL BYPASS GRAFT Right 12/25/2019   Procedure: BYPASS GRAFT FEMORAL- BELOW KNEE POPLITEAL ARTERY RIGHT USING GORE PROPATEN VASCULAR GRAFT;  Surgeon: [name], MD;  Location: [facility] OR;  Service: Vascular;  Laterality: Right;   FEMORAL-POPLITEAL BYPASS GRAFT Right 09/21/2022   Procedure: REDO RIGHT LOWER EXTREMITY BYPASS USING GORE PROPATEN 6mm REMOVALBLE RING GRAFT;  Surgeon: [name], MD;  Location: [facility] OR;  Service: Vascular;  Laterality: Right;   INTRAOPERATIVE  ARTERIOGRAM Right 09/21/2022   Procedure: INTRA OPERATIVE ARTERIOGRAM;  Surgeon: [name], MD;  Location: [facility] OR;  Service: Vascular;  Laterality: Right;   LEFT HEART CATH AND CORS/GRAFTS ANGIOGRAPHY N/A 11/26/2019   Procedure: LEFT HEART CATH AND CORS/GRAFTS ANGIOGRAPHY;  Surgeon: Yvonne Kendall, MD;  Location: [facility] INVASIVE CV LAB;  Service: Cardiovascular;  Laterality: N/A;   LEFT HEART CATH AND CORS/GRAFTS ANGIOGRAPHY N/A 12/01/2020   Procedure: LEFT HEART CATH AND CORS/GRAFTS ANGIOGRAPHY;  Surgeon: Lennette Bihari, MD;  Location: [facility] INVASIVE CV LAB;  Service: Cardiovascular;  Laterality: N/A;   LOWER EXTREMITY ANGIOGRAM Right 12/25/2019   Procedure: RIGHT LOWER EXTREMITY ANGIOGRAM;  Surgeon: [name], MD;  Location: [facility] OR;  Service: Vascular;  Laterality: Right;   PERIPHERAL VASCULAR BALLOON ANGIOPLASTY Left 12/07/2019   Procedure: PERIPHERAL VASCULAR BALLOON ANGIOPLASTY;  Surgeon: [name], MD;  Location: [facility] INVASIVE CV LAB;  Service: Cardiovascular;  Laterality: Left;  SFA    reports that he has been smoking cigars and cigarettes. He has been exposed to tobacco smoke. He has never used smokeless tobacco. He reports current drug use. Drug: Marijuana. He reports that he does not drink alcohol. family history includes Heart disease in his father, mother, and sister. Allergies  Allergen Reactions   Imdur [Isosorbide Nitrate] Other (See Comments)    Severe headache   Isosorbide     Other Reaction(s): Other (See Comments)  Severe headache   Current Outpatient Medications on File Prior to Visit  Medication Sig Dispense Refill   aspirin EC 81 MG tablet Take 1 tablet (81 mg total) by mouth daily. (Patient not taking: Reported on 01/10/2023)     No current facility-administered medications on file prior to visit.        ROS:  All others reviewed and negative.  Objective        PE:  BP (!) 140/92 (BP Location: Right Arm, Patient Position: Sitting, Cuff  Size: Normal)   Pulse 70   Temp 98.9 F (37.2 C) (Oral)   Ht 5\' 10"  (1.778 m)   Wt 205 lb (93 kg)   SpO2 98%   BMI 29.41 kg/m                 Constitutional: Pt appears in NAD               HENT: Head: NCAT.                Right Ear: External ear normal.                 Left Ear: External ear normal.                Eyes: . Pupils are equal, round, and reactive to light. Conjunctivae and EOM are normal  Nose: without d/c or deformity               Neck: Neck supple. Gross normal ROM               Cardiovascular: Normal rate and regular rhythm.                 Pulmonary/Chest: Effort normal and breath sounds without rales or wheezing.                Abd:  Soft, NT, ND, + BS, no organomegaly               Neurological: Pt is alert. At baseline orientation, motor grossly intact               Skin: Skin is warm. No rashes, no other new lesions, LE edema - none               Psychiatric: Pt behavior is normal without agitation   Micro: none  Cardiac tracings I have personally interpreted today:  none  Pertinent Radiological findings (summarize): none   Lab Results  Component Value Date   WBC 8.3 10/29/2022   HGB 12.3 (L) 10/29/2022   HCT 37.7 (L) 10/29/2022   PLT 266 10/29/2022   GLUCOSE 87 10/29/2022   CHOL 164 09/22/2022   TRIG 66 09/22/2022   HDL 37 (L) 09/22/2022   LDLCALC 114 (H) 09/22/2022   ALT 30 09/21/2022   AST 22 09/21/2022   NA 139 10/29/2022   K 3.7 10/29/2022   CL 103 10/29/2022   CREATININE 1.58 (H) 10/29/2022   BUN 9 10/29/2022   CO2 26 10/29/2022   TSH 1.909 11/29/2020   INR 1.0 09/21/2022   HGBA1C 5.3 11/29/2020   Assessment/Plan:  Benjamin Espinoza is a 57 y.o. Black or African American [2] male with  has a past medical history of Abnormal stress test, Anxiety, Asthma, Atypical chest pain (05/07/2017), Coronary artery disease, DDD (degenerative disc disease), lumbar (11/08/2019), Essential hypertension, CABG (x4 01/14/2016 [place]),  Hyperlipidemia, Myocardial infarction [facility]) (2017), PVD (peripheral vascular disease) (HCC) (09/21/2019), Shortness of breath, and Tobacco use disorder.  Tobacco use disorder Now down to 3 cigs per day - trying to quit; pt counsled to quit  Encounter for well adult exam with abnormal findings Age and sex appropriate education and counseling updated with regular exercise and diet Referrals for preventative services - due for colonoscopy, due for hep c screen Immunizations addressed - none needed Smoking counseling  - counsled to quit, pt not reaady Evidence for depression or other mood disorder - none significant Most recent labs reviewed. I have personally reviewed and have noted: 1) the patient's medical and social history 2) The patient's current medications and supplements 3) The patient's height, weight, and BMI have been recorded in the chart   Peripheral arterial disease (HCC) Stable overall but with worsening LLE claudication, for f/u vascular surgury, likely to need surgury soon, to restart xarelto 20 qd  OAB (overactive bladder) Recent worsening, for vesicare 5 qd  Hyperlipidemia Lab Results  Component Value Date   LDLCALC 114 (H) 09/22/2022   uncontrolled, pt to restart lipitor 80 mg qd   Essential hypertension BP Readings from Last 3 Encounters:  01/10/23 (!) 140/92  10/29/22 (!) 166/101  10/14/22 (!) 129/90   uncontrolled, pt to restart coreg 3.125 every day, losartan 50 qd   Chronic pain Pt for bridging meds only as I do not treat chronic pain, limit total  3 mo, for referral pain management  Coronary artery disease Stable, for restart ranexa  Followup: Return in about 6 months (around 07/10/2023).  Oliver Barre, MD 01/11/2023 9:11 PM [facility]

## 2023-01-11 ENCOUNTER — Encounter: Payer: Self-pay | Admitting: Internal Medicine

## 2023-01-11 DIAGNOSIS — G8929 Other chronic pain: Secondary | ICD-10-CM | POA: Insufficient documentation

## 2023-01-11 DIAGNOSIS — N3281 Overactive bladder: Secondary | ICD-10-CM

## 2023-01-11 HISTORY — DX: Other chronic pain: G89.29

## 2023-01-11 HISTORY — DX: Overactive bladder: N32.81

## 2023-01-11 NOTE — Assessment & Plan Note (Signed)
BP Readings from Last 3 Encounters:  01/10/23 (!) 140/92  10/29/22 (!) 166/101  10/14/22 (!) 129/90   uncontrolled, pt to restart coreg 3.125 every day, losartan 50 qd

## 2023-01-11 NOTE — Assessment & Plan Note (Addendum)
Stable overall but with worsening LLE claudication, for f/u vascular surgury, likely to need surgury soon, to restart xarelto 20 qd

## 2023-01-11 NOTE — Assessment & Plan Note (Signed)
Lab Results  Component Value Date   LDLCALC 114 (H) 09/22/2022   uncontrolled, pt to restart lipitor 80 mg qd

## 2023-01-11 NOTE — Assessment & Plan Note (Signed)
Age and sex appropriate education and counseling updated with regular exercise and diet Referrals for preventative services - due for colonoscopy, due for hep c screen Immunizations addressed - none needed Smoking counseling  - counsled to quit, pt not reaady Evidence for depression or other mood disorder - none significant Most recent labs reviewed. I have personally reviewed and have noted: 1) the patient's medical and social history 2) The patient's current medications and supplements 3) The patient's height, weight, and BMI have been recorded in the chart

## 2023-01-11 NOTE — Assessment & Plan Note (Signed)
Pt for bridging meds only as I do not treat chronic pain, limit total 3 mo, for referral pain management

## 2023-01-11 NOTE — Assessment & Plan Note (Signed)
Stable, for restart ranexa

## 2023-01-11 NOTE — Assessment & Plan Note (Signed)
>>  ASSESSMENT AND PLAN FOR CORONARY ARTERY DISEASE WRITTEN ON 01/11/2023  9:09 PM BY NORLEEN LYNWOOD ORN, MD  Stable, for restart ranexa

## 2023-01-11 NOTE — Assessment & Plan Note (Signed)
Recent worsening, for vesicare 5 qd

## 2023-01-18 ENCOUNTER — Encounter: Payer: Self-pay | Admitting: Physical Medicine and Rehabilitation

## 2023-01-25 ENCOUNTER — Telehealth: Payer: Self-pay

## 2023-01-25 NOTE — Telephone Encounter (Signed)
Spoke to patient and advised. He is looking for stiffer soled shoes. He will call back to schedule an appointment if he does not improve. thanks

## 2023-01-25 NOTE — Telephone Encounter (Signed)
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 This can cause some localized pain and swelling. I would continue with supportive shoes and stiffer soled shoes to help. If symptoms continue I would like to see him back in the office. Thanks!

## 2023-02-03 ENCOUNTER — Ambulatory Visit: Payer: Medicare HMO | Admitting: Podiatry

## 2023-02-03 ENCOUNTER — Telehealth: Payer: Self-pay | Admitting: Internal Medicine

## 2023-02-03 MED ORDER — OXYCODONE-ACETAMINOPHEN 7.5-325 MG PO TABS: 1.0000 | ORAL_TABLET | Freq: Three times a day (TID) | ORAL | 0 refills | Status: DC | PRN

## 2023-02-03 NOTE — Telephone Encounter (Signed)
Done to dispense dec 10 or after - no early refills

## 2023-02-03 NOTE — Telephone Encounter (Signed)
Prescription Request  02/03/2023  LOV: 01/10/2023  What is the name of the medication or equipment? Oxycodone 7.5  Have you contacted your pharmacy to request a refill? Yes   Which pharmacy would you like this sent to?  [id number] [place], [place] - [address] AT [facility] [address] [place] [address] Phone: [phone] Fax: [phone]    Patient notified that their request is being sent to the clinical staff for review and that they should receive a response within 2 business days.   Please advise at Mobile [phone] (mobile)

## 2023-02-07 NOTE — Telephone Encounter (Signed)
It was already sent but he can't pick up until tomorrow.

## 2023-02-07 NOTE — Telephone Encounter (Signed)
Please send today since tomorrow is the 10th

## 2023-02-14 ENCOUNTER — Encounter: Payer: Medicare HMO | Admitting: Physical Medicine and Rehabilitation

## 2023-03-09 ENCOUNTER — Telehealth: Payer: Self-pay | Admitting: Internal Medicine

## 2023-03-09 MED ORDER — OXYCODONE-ACETAMINOPHEN 7.5-325 MG PO TABS: 1.0000 | ORAL_TABLET | Freq: Three times a day (TID) | ORAL | 0 refills | Status: DC | PRN

## 2023-03-09 NOTE — Telephone Encounter (Signed)
 Done erx - last script as pt has been referred to pain management

## 2023-03-09 NOTE — Telephone Encounter (Signed)
 Copied from CRM [id number]. Topic: Clinical - Medication Refill >> Mar 09, 2023  8:31 AM Burnard DEL wrote: Most Recent Primary Care Visit:  Provider: NORLEEN LYNWOOD ORN  Department: [facility]  Visit Type: NEW PATIENT  Date: 01/10/2023  Medication: oxyCODONE -acetaminophen  (PERCOCET) 7.5-325 MG tablet  Has the patient contacted their pharmacy? No (Agent: If no, request that the patient contact the pharmacy for the refill. If patient does not wish to contact the pharmacy document the reason why and proceed with request.) (Agent: If yes, when and what did the pharmacy advise?)  Is this the correct pharmacy for this prescription? Yes If no, delete pharmacy and type the correct one.  This is the patient's preferred pharmacy:  [id number] - [place], [place] - [address] AT [facility] [address] [place] [address] Phone: [phone] Fax: [phone]   Has the prescription been filled recently? Yes  Is the patient out of the medication? Yes  Has the patient been seen for an appointment in the last year OR does the patient have an upcoming appointment? Yes  Can we respond through MyChart? Yes  Agent: Please be advised that Rx refills may take up to 3 business days. We ask that you follow-up with your pharmacy.

## 2023-03-10 ENCOUNTER — Other Ambulatory Visit: Payer: Self-pay | Admitting: Internal Medicine

## 2023-03-10 DIAGNOSIS — R739 Hyperglycemia, unspecified: Secondary | ICD-10-CM

## 2023-03-10 DIAGNOSIS — E782 Mixed hyperlipidemia: Secondary | ICD-10-CM

## 2023-03-10 DIAGNOSIS — Z125 Encounter for screening for malignant neoplasm of prostate: Secondary | ICD-10-CM

## 2023-03-10 DIAGNOSIS — E559 Vitamin D deficiency, unspecified: Secondary | ICD-10-CM

## 2023-03-10 DIAGNOSIS — E538 Deficiency of other specified B group vitamins: Secondary | ICD-10-CM

## 2023-03-23 ENCOUNTER — Encounter: Payer: Medicare HMO | Attending: Physical Medicine and Rehabilitation | Admitting: Physical Medicine and Rehabilitation

## 2023-03-23 VITALS — BP 131/88 | HR 84 | Ht 70.0 in | Wt 212.0 lb

## 2023-03-23 DIAGNOSIS — Z7409 Other reduced mobility: Secondary | ICD-10-CM

## 2023-03-23 DIAGNOSIS — G8929 Other chronic pain: Secondary | ICD-10-CM

## 2023-03-23 DIAGNOSIS — M5442 Lumbago with sciatica, left side: Secondary | ICD-10-CM | POA: Insufficient documentation

## 2023-03-23 DIAGNOSIS — I739 Peripheral vascular disease, unspecified: Secondary | ICD-10-CM

## 2023-03-23 DIAGNOSIS — Z5181 Encounter for therapeutic drug level monitoring: Secondary | ICD-10-CM

## 2023-03-23 DIAGNOSIS — M25572 Pain in left ankle and joints of left foot: Secondary | ICD-10-CM | POA: Insufficient documentation

## 2023-03-23 DIAGNOSIS — Z029 Encounter for administrative examinations, unspecified: Secondary | ICD-10-CM

## 2023-03-23 DIAGNOSIS — M79604 Pain in right leg: Secondary | ICD-10-CM | POA: Insufficient documentation

## 2023-03-23 DIAGNOSIS — G894 Chronic pain syndrome: Secondary | ICD-10-CM

## 2023-03-23 DIAGNOSIS — Z79891 Long term (current) use of opiate analgesic: Secondary | ICD-10-CM

## 2023-03-23 DIAGNOSIS — Z789 Other specified health status: Secondary | ICD-10-CM

## 2023-03-23 DIAGNOSIS — R52 Pain, unspecified: Secondary | ICD-10-CM

## 2023-03-23 HISTORY — DX: Other specified health status: Z78.9

## 2023-03-23 HISTORY — DX: Pain in left ankle and joints of left foot: M25.572

## 2023-03-23 HISTORY — DX: Pain in right leg: M79.604

## 2023-03-23 HISTORY — DX: Other reduced mobility: Z74.09
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
## 2023-03-23 NOTE — Patient Instructions (Signed)
Please request records from [facility] in [place] for your medical documentation and any imaging they did including MRI/CT of your back within the last year; please specify you will need a disc or the actual images and not just a report.  Resume gabapentin 300 mg TID; I sent a script for this today.  If urine drug screen today is consistent with our discussion and records from [place] are back, I will call you before starting Percocet and we can discuss whether we will continue current dosing or increase to 10 mg tablets.   Goal once pain is under better control is to go back to PT.   Follow-up with my nurse practitioner Riley Lam in 1 month.  If pain is well-controlled, you will see her every other month and me every 6 months.  If not, we will follow-up more frequently.  Feel free to use MyChart between appointments to discuss any acute issues, making usually get back to within 48 hours.

## 2023-03-25 ENCOUNTER — Ambulatory Visit: Payer: Self-pay | Admitting: Internal Medicine

## 2023-03-25 ENCOUNTER — Ambulatory Visit (INDEPENDENT_AMBULATORY_CARE_PROVIDER_SITE_OTHER): Payer: Medicare HMO

## 2023-03-25 ENCOUNTER — Telehealth: Payer: Self-pay

## 2023-03-25 VITALS — Ht 70.0 in | Wt 208.0 lb

## 2023-03-25 DIAGNOSIS — Z1211 Encounter for screening for malignant neoplasm of colon: Secondary | ICD-10-CM

## 2023-03-25 DIAGNOSIS — Z1212 Encounter for screening for malignant neoplasm of rectum: Secondary | ICD-10-CM

## 2023-03-25 DIAGNOSIS — Z0000 Encounter for general adult medical examination without abnormal findings: Secondary | ICD-10-CM | POA: Diagnosis not present

## 2023-03-25 NOTE — Telephone Encounter (Signed)
This would be too soon - he should not be out, as he was given #90 on jan 8 which should last feb 6  Also remember I have to have a limit of 3 months of medication from the start Jan 10 2023, as I dont treat chronic pain due to all the regulations and urine testing involved

## 2023-03-25 NOTE — Telephone Encounter (Addendum)
Copied from CRM [id number]. Topic: Clinical - Red Word Triage >> Mar 25, 2023  3:12 PM Benjamin Espinoza wrote: Red Word that prompted transfer to Nurse Triage:   patient is in pain rated pain 10-11 have swelling right leg where the incision had lower extremity bypass put a vein in his legs was 6 months ago   Chief Complaint: swelling, severe pain Symptoms: severe pain in left ankle, right knee, back, swelling and redness to RLE Frequency: chronic, continual, acute without meds Pertinent Negatives: Patient denies chest pain, pain to swollen area, cool feet, streaking redness, fever Disposition: [] 911 / [x] ED /[] Urgent Care (no appt availability in office) / [] Appointment(In office/virtual)/ []  [facility]/ [] Home Care/ [] Refused Recommended Disposition /[] [facility]/ [x]  Follow-up with PCP Additional Notes: Pt is poor historian. Pt reporting severe pain "10 or 11" out of 10 to back, left ankle, and right knee, all of which pt confirms are "chronic" issues, as well as swelling to leg where he had previous surgery, confirmed significant cardiac hx. Pt primarily concerned with getting refill for his pain med. Nurse advised that a HP message would be sent to office about med. Pt reporting that he has "very very serious problem in back, steel chain knot in back, yesterday almost fell down steps with ankle, feels paralyzed in left leg when lay down." Pt confirms swelling to only right leg, near where previously healed incision from bypass surgery is located, "where the incision is, right calf to groin, but swelling from incision to just above my knee, not all the way up my thigh, just front part of the leg." Pt stating that the swelling severity is "pretty much." Pt confirms redness to the swelling "because discoloration in leg" due to hx of circulation issues but pt confirms that current redness is "something new." Pt confirms "absolutely no pain" at site with swelling, confirms feet/legs are  warm but "not hot." Pt reporting "always have difficulty breathing" with cardiac hx. Nurse asked if pt having increased SOB from usual, pt states this is a "trick question," pt unsure. Advised pt be examined in next 4 hours for symptoms and more immediate care for severe pain if needed. Pt reporting unable to get to office appt within 4 hours due to insurance transportation requiring 24-hr notice, would need ambulance for transportation, concern for getting back after ED. Advised that ED would be appropriate for his symptoms if unable to make urgent appt, advised to utilize social work at hospital for ride home. Pt reporting that he will call vascular doc to see if other recommendations as well, then plans to go to ED. Also discussed - Pt concerned about getting medication refill for severe pain. Pt reporting he does not yet have a contract with the pain clinic. Pt reporting this is due to the fact that he did not receive advance notice to get urine labs completed before the pain clinic appt originally scheduled for today, but pt's only mode of transportation through insurance needs 24 hours notice, so pt was unable complete the urine test and has now scheduled with the lab and transportation as well as rescheduled with the pain clinic. Pt states that he will likely have his contract with the pain clinic in place by the next time he sees Dr. Jonny Ruiz, but that in the meantime, he needs relief for his pain. Pt reporting also that "7.5 mg not doing anything for me," so he has used up his medication early in his own administration of "  1 pill then 0.5 pill in few hours" after to ensure coverage of pain. Pt requesting that if Dr. Jonny Ruiz is able to send script, that he write script for 7.5 mg "4x per day" or increase dose to 10 mg. Nurse advised that HP message would be sent to office with request and that pt would receive call from office if any updates on whether or not Dr. Jonny Ruiz is able to send the script - please advise. Pt  verbalized understanding and stated he will call back at end of day if heard nothing back by then.  Reason for Disposition  [1] Thigh, calf, or ankle swelling AND [2] only 1 side  Answer Assessment - Initial Assessment Questions 1. ONSET: "When did the pain start?"      Been having this pain for 8-9 years 2. LOCATION: "Where is the pain located?"      Severe pain in left ankle, right knee, and back 3. PAIN: "How bad is the pain?"    (Scale 1-10; or mild, moderate, severe)   -  MILD (1-3): doesn't interfere with normal activities    -  MODERATE (4-7): interferes with normal activities (e.g., work or school) or awakens from sleep, limping    -  SEVERE (8-10): excruciating pain, unable to do any normal activities, unable to walk     10 or 11 overall 4. WORK OR EXERCISE: "Has there been any recent work or exercise that involved this part of the body?"      Not sure if snow rain air affecting pain, arthritis 5. CAUSE: "What do you think is causing the leg pain?"     Chronic pain 6. OTHER SYMPTOMS: "Do you have any other symptoms?" (e.g., chest pain, back pain, breathing difficulty, swelling, rash, fever, numbness, weakness)     Leg swelling started 3-5 days ago, didn't really notice it until yesterday, one leg only right leg, swelling where the incision is, right calf to groin, swelling from incision to just above my knee, not all the way up my thigh, just front part of the leg, redness because discoloration in leg because circulation issues, something new, gonna make appt with vascular and vein in [place], absolutely no pain in leg. No med, "hurting like hell" "back is on fire right now."  Answer Assessment - Initial Assessment Questions 1. ONSET: "When did the swelling start?" (e.g., minutes, hours, days)     started 3-5 days ago, didn't really notice it until yesterday 2. LOCATION: "What part of the leg is swollen?"  "Are both legs swollen or just one leg?"      one leg only right leg,  swelling where the incision is, right calf to groin, swelling from incision to just above my knee, not all the way up my thigh, just front part of the leg 3. SEVERITY: "How bad is the swelling?" (e.g., localized; mild, moderate, severe)   - Localized: Small area of swelling localized to one leg.   - MILD pedal edema: Swelling limited to foot and ankle, pitting edema < 1/4 inch (6 mm) deep, rest and elevation eliminate most or all swelling.   - MODERATE edema: Swelling of lower leg to knee, pitting edema > 1/4 inch (6 mm) deep, rest and elevation only partially reduce swelling.   - SEVERE edema: Swelling extends above knee, facial or hand swelling present.     "Pretty much"  4. REDNESS: "Does the swelling look red or infected?"     redness because discoloration in leg  because circulation issues, but confirms something new 5. PAIN: "Is the swelling painful to touch?" If Yes, ask: "How painful is it?"   (Scale 1-10; mild, moderate or severe)    absolutely no pain at site with swelling 6. FEVER: "Do you have a fever?" If Yes, ask: "What is it, how was it measured, and when did it start?"      denies 8. MEDICAL HISTORY: "Do you have a history of blood clots (e.g., DVT), cancer, heart failure, kidney disease, or liver failure?"     Significant cardiac hx with previous healed incision to this area of swelling for bypass surgery 10. OTHER SYMPTOMS: "Do you have any other symptoms?" (e.g., chest pain, difficulty breathing)       denies  Protocols used: Leg Pain-A-AH, Leg Swelling and Edema-A-AH

## 2023-03-25 NOTE — Telephone Encounter (Signed)
Patient here today for AWV I and he is requesting for a refill of the pain medication, Oxycodone 7.5-325 mg.  He is complaining of pain worsening and hoping you can up the dosage, he has been to pain management Wednesday but they sent him to [facility] and patient stated that he has to contact his insurance to verify if he is going to have to pay out of pocket for the urine sample.  He is asking for Dr. Jonny Ruiz to refill the pain medication until he get get situated with pain management

## 2023-03-25 NOTE — Patient Instructions (Signed)
Benjamin Espinoza , Thank you for taking time to come for your Medicare Wellness Visit. I appreciate your ongoing commitment to your health goals. Please review the following plan we discussed and let me know if I can assist you in the future.   Referrals/Orders/Follow-Ups/Clinician Recommendations: It was nice to meet you today.  You are due for a colonoscopy, please call [facility], [phone] to schedule your colonoscopy.  You are also due for a flu vaccine and Pneumonia vaccine and can it done at the pharmacy.     This is a list of the screening recommended for you and due dates:  Health Maintenance  Topic Date Due   Pneumococcal Vaccination (1 of 2 - PCV) Never done   Hepatitis C Screening  Never done   DTaP/Tdap/Td vaccine (1 - Tdap) Never done   Zoster (Shingles) Vaccine (1 of 2) Never done   Colon Cancer Screening  Never done   COVID-19 Vaccine (2 - Mixed Product risk series) 04/30/2019   Flu Shot  Never done   Medicare Annual Wellness Visit  03/24/2024   HIV Screening  Completed   HPV Vaccine  Aged Out   Screening for Lung Cancer  Discontinued    Advanced directives: (Declined) Advance directive discussed with you today. Even though you declined this today, please call our office should you change your mind, and we can give you the proper paperwork for you to fill out.  Next Medicare Annual Wellness Visit scheduled for next year: Yes

## 2023-03-25 NOTE — Progress Notes (Unsigned)
Subjective:   Benjamin Espinoza is a 58 y.o. male who presents for an Initial Medicare Annual Wellness Visit.  Visit Complete: In person   Cardiac Risk Factors include: male gender;advanced age (>72men, >80 women);hypertension;Other (see comment);dyslipidemia, Risk factor comments: PVD, CAD, Myocardial infarction     Objective:    Today's Vitals   03/25/23 1011 03/25/23 1059  Weight:  208 lb (94.3 kg)  Height:  5\' 10"  (1.778 m)  PainSc: 10-Worst pain ever    Body mass index is 29.84 kg/m.     03/25/2023   11:14 AM 10/29/2022    5:25 PM 09/09/2022    9:15 AM 11/28/2020    8:49 PM 12/25/2019    6:29 AM 12/20/2019    8:18 AM 12/07/2019    5:36 AM  Advanced Directives  Does Patient Have a Medical Advance Directive? No No No No No No No  Would patient like information on creating a medical advance directive? No - Patient declined  No - Patient declined No - Patient declined No - Patient declined No - Patient declined     Current Medications (verified) Outpatient Encounter Medications as of 03/25/2023  Medication Sig   aspirin EC 81 MG tablet Take 1 tablet (81 mg total) by mouth daily.   atorvastatin (LIPITOR) 80 MG tablet Take 1 tablet (80 mg total) by mouth daily.   carvedilol (COREG) 3.125 MG tablet Take 1 tablet (3.125 mg total) by mouth 2 (two) times daily with a meal.   cilostazol (PLETAL) 100 MG tablet Take 1 tablet (100 mg total) by mouth 2 (two) times daily.   losartan (COZAAR) 50 MG tablet Take 1 tablet (50 mg total) by mouth daily.   ranolazine (RANEXA) 1000 MG SR tablet Take 1 tablet (1,000 mg total) by mouth 2 (two) times daily.   solifenacin (VESICARE) 5 MG tablet Take 1 tablet (5 mg total) by mouth daily.   oxyCODONE-acetaminophen (PERCOCET) 7.5-325 MG tablet Take 1 tablet by mouth every 8 (eight) hours as needed for severe pain (pain score 7-10). (Patient not taking: Reported on 03/25/2023)   No facility-administered encounter medications on file as of 03/25/2023.     Allergies (verified) Imdur [isosorbide nitrate] and Isosorbide   History: Past Medical History:  Diagnosis Date   Abnormal stress test    Anxiety    Asthma    as a child   Atypical chest pain 05/07/2017   Coronary artery disease    DDD (degenerative disc disease), lumbar 11/08/2019   Formatting of this note might be different from the original. 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 [place]   Hyperlipidemia    Myocardial infarction [facility]) 2017   PVD (peripheral vascular disease) (HCC) 09/21/2019   Shortness of breath    Tobacco use disorder    Past Surgical History:  Procedure Laterality Date   ABDOMINAL AORTOGRAM W/LOWER EXTREMITY Bilateral 09/21/2019   Procedure: ABDOMINAL AORTOGRAM W/LOWER EXTREMITY;  Surgeon: [name], MD;  Location: [facility] INVASIVE CV LAB;  Service: Cardiovascular;  Laterality: Bilateral;   ABDOMINAL AORTOGRAM W/LOWER EXTREMITY N/A 12/07/2019   Procedure: ABDOMINAL AORTOGRAM W/LOWER EXTREMITY;  Surgeon: [name], MD;  Location: [facility] INVASIVE CV LAB;  Service: Cardiovascular;  Laterality: N/A;   ABDOMINAL AORTOGRAM W/LOWER EXTREMITY N/A 09/09/2022   Procedure: ABDOMINAL AORTOGRAM W/LOWER EXTREMITY;  Surgeon: Cephus Shelling, MD;  Location: [facility] INVASIVE CV LAB;  Service: Vascular;  Laterality: N/A;   CARDIAC CATHETERIZATION  CORONARY ARTERY BYPASS GRAFT     quadruple bypas 2017   CORONARY STENT INTERVENTION N/A 11/26/2019   Procedure: CORONARY STENT INTERVENTION;  Surgeon: Yvonne Kendall, MD;  Location: [facility] INVASIVE CV LAB;  Service: Cardiovascular;  Laterality: N/A;   FEMORAL-POPLITEAL BYPASS GRAFT Right 12/25/2019   Procedure: BYPASS GRAFT FEMORAL- BELOW KNEE POPLITEAL ARTERY RIGHT USING GORE PROPATEN VASCULAR GRAFT;  Surgeon: [name], MD;  Location: [facility] OR;  Service: Vascular;  Laterality: Right;   FEMORAL-POPLITEAL BYPASS GRAFT Right 09/21/2022   Procedure: REDO RIGHT LOWER  EXTREMITY BYPASS USING GORE PROPATEN 6mm REMOVALBLE RING GRAFT;  Surgeon: [name], MD;  Location[facility] OR;  Service: Vascular;  Laterality: Right;   INTRAOPERATIVE ARTERIOGRAM Right 09/21/2022   Procedure: INTRA OPERATIVE ARTERIOGRAM;  Surgeon: [name], MD;  Location: [facility] OR;  Service: Vascular;  Laterality: Right;   LEFT HEART CATH AND CORS/GRAFTS ANGIOGRAPHY N/A 11/26/2019   Procedure: LEFT HEART CATH AND CORS/GRAFTS ANGIOGRAPHY;  Surgeon: Yvonne Kendall, MD;  Location: [facility] INVASIVE CV LAB;  Service: Cardiovascular;  Laterality: N/A;   LEFT HEART CATH AND CORS/GRAFTS ANGIOGRAPHY N/A 12/01/2020   Procedure: LEFT HEART CATH AND CORS/GRAFTS ANGIOGRAPHY;  Surgeon: Lennette Bihari, MD;  Location: [facility] INVASIVE CV LAB;  Service: Cardiovascular;  Laterality: N/A;   LOWER EXTREMITY ANGIOGRAM Right 12/25/2019   Procedure: RIGHT LOWER EXTREMITY ANGIOGRAM;  Surgeon: [name], MD;  Location: [facility] OR;  Service: Vascular;  Laterality: Right;   PERIPHERAL VASCULAR BALLOON ANGIOPLASTY Left 12/07/2019   Procedure: PERIPHERAL VASCULAR BALLOON ANGIOPLASTY;  Surgeon: [name], MD;  Location: [facility] INVASIVE CV LAB;  Service: Cardiovascular;  Laterality: Left;  SFA   Family History  Problem Relation Age of Onset   Heart disease Mother    Heart disease Father    Heart disease Sister    Social History   Socioeconomic History   Marital status: Married    Spouse name: Not on file   Number of children: Not on file   Years of education: Not on file   Highest education level: Not on file  Occupational History   Not on file  Tobacco Use   Smoking status: Every Day    Types: Cigars, Cigarettes    Passive exposure: Current (Wife is a smoker)   Smokeless tobacco: Never   Tobacco comments:    as of 12/20/19 2 cigararettes/day  Vaping Use   Vaping status: Never Used  Substance and Sexual Activity   Alcohol use: No   Drug use: Yes    Types: Marijuana   Sexual  activity: Not on file  Other Topics Concern   Not on file  Social History Narrative   Lives with alone but has a caregiver   Social Drivers of Corporate investment banker Strain: Low Risk  (03/25/2023)   Overall Financial Resource Strain (CARDIA)    Difficulty of Paying Living Expenses: Not very hard  Food Insecurity: No Food Insecurity (03/25/2023)   Hunger Vital Sign    Worried About Running Out of Food in the Last Year: Never true    Ran Out of Food in the Last Year: Never true  Transportation Needs: Unmet Transportation Needs (11/08/2019)   Received from [employer], [facility]   PRAPARE - Transportation    Lack of Transportation (Medical): Yes    Lack of Transportation (Non-Medical): No  Physical Activity: Inactive (03/25/2023)   Exercise Vital Sign    Days of Exercise per Week: 0 days  Minutes of Exercise per Session: 0 min  Stress: Stress Concern Present (03/25/2023)   [employer] of Occupational Health - Occupational Stress Questionnaire    Feeling of Stress : Rather much  Social Connections: Socially Integrated (03/25/2023)   Social Connection and Isolation Panel [NHANES]    Frequency of Communication with Friends and Family: More than three times a week    Frequency of Social Gatherings with Friends and Family: Never    Attends Religious Services: More than 4 times per year    Active Member of [employer] or Organizations: Yes    Attends Banker Meetings: Never    Marital Status: Married    Tobacco Counseling Ready to quit: Not Answered Counseling given: Not Answered Tobacco comments: as of 12/20/19 2 cigararettes/day   Clinical Intake:  Pre-visit preparation completed: Yes  Pain : 0-10 Pain Score: 10-Worst pain ever Pain Type: Chronic pain Pain Location: Back (Lt ankle and rt knee) Pain Orientation: Lower Pain Descriptors / Indicators: Aching, Discomfort Pain Onset: Other (comment) Pain Frequency: Constant     BMI - recorded:  29.84 Nutritional Status: BMI 25 -29 Overweight Diabetes: No  How often do you need to have someone help you when you read instructions, pamphlets, or other written materials from your doctor or pharmacy?: 1 - Never     Information entered by :: Cono Gebhard, RMA   Activities of Daily Living    03/25/2023   10:11 AM 09/21/2022    7:25 AM  In your present state of health, do you have any difficulty performing the following activities:  Hearing? 0   Vision? 0   Difficulty concentrating or making decisions? 0   Walking or climbing stairs? 1   Dressing or bathing? 0   Doing errands, shopping? 1 0  Comment transprotation Arboriculturist and eating ? N   Using the Toilet? N   In the past six months, have you accidently leaked urine? N   Do you have problems with loss of bowel control? N   Managing your Medications? N   Managing your Finances? N   Housekeeping or managing your Housekeeping? N     Patient Care Team: Corwin Levins, MD as PCP - General (Internal Medicine) Thomasene Ripple, DO as PCP - Cardiology (Cardiology)  Indicate any recent Medical Services you may have received from other than [facility] providers in the past year (date may be approximate).     Assessment:   This is a routine wellness examination for [place].  Hearing/Vision screen Hearing Screening - Comments:: Denies hearing difficulties   Vision Screening - Comments:: Need eyeglasses    Goals Addressed   None   Depression Screen    03/25/2023   11:16 AM 03/23/2023   11:09 AM 01/10/2023    9:48 AM  PHQ 2/9 Scores  PHQ - 2 Score 4 3 0  PHQ- 9 Score 4 6     Fall Risk    03/25/2023   11:14 AM 03/23/2023   11:04 AM 01/10/2023    9:47 AM  Fall Risk   Falls in the past year? 0 0 0  Number falls in past yr: 0  0  Injury with Fall? 0  0  Risk for fall due to : No Fall Risks  No Fall Risks  Follow up Falls prevention discussed;Falls evaluation completed  Falls evaluation completed    MEDICARE  RISK AT HOME: Medicare Risk at Home Any stairs in or around the home?: Yes If  so, are there any without handrails?: Yes Home free of loose throw rugs in walkways, pet beds, electrical cords, etc?: Yes Adequate lighting in your home to reduce risk of falls?: Yes Life alert?: No Use of a cane, walker or w/c?: Yes Grab bars in the bathroom?: No Shower chair or bench in shower?: No Elevated toilet seat or a handicapped toilet?: No  TIMED UP AND GO:  Was the test performed? Yes  Length of time to ambulate 10 feet: 20 sec Gait slow and steady with assistive device    Cognitive Function:        Immunizations Immunization History  Administered Date(s) Administered   Unspecified SARS-COV-2 Vaccination 04/02/2019    TDAP status: Due, Education has been provided regarding the importance of this vaccine. Advised may receive this vaccine at local pharmacy or Health Dept. Aware to provide a copy of the vaccination record if obtained from local pharmacy or Health Dept. Verbalized acceptance and understanding.  Flu Vaccine status: Due, Education has been provided regarding the importance of this vaccine. Advised may receive this vaccine at local pharmacy or Health Dept. Aware to provide a copy of the vaccination record if obtained from local pharmacy or Health Dept. Verbalized acceptance and understanding.  Pneumococcal vaccine status: Due, Education has been provided regarding the importance of this vaccine. Advised may receive this vaccine at local pharmacy or Health Dept. Aware to provide a copy of the vaccination record if obtained from local pharmacy or Health Dept. Verbalized acceptance and understanding.  Covid-19 vaccine status: Completed vaccines  Qualifies for Shingles Vaccine? Yes   Zostavax completed No   Shingrix Completed?: No.    Education has been provided regarding the importance of this vaccine. Patient has been advised to call insurance company to determine out of pocket  expense if they have not yet received this vaccine. Advised may also receive vaccine at local pharmacy or Health Dept. Verbalized acceptance and understanding.  Screening Tests Health Maintenance  Topic Date Due   Pneumococcal Vaccine 28-56 Years old (1 of 2 - PCV) Never done   Hepatitis C Screening  Never done   DTaP/Tdap/Td (1 - Tdap) Never done   Zoster Vaccines- Shingrix (1 of 2) Never done   Colonoscopy  Never done   COVID-19 Vaccine (2 - Mixed Product risk series) 04/30/2019   INFLUENZA VACCINE  Never done   Medicare Annual Wellness (AWV)  03/24/2024   HIV Screening  Completed   HPV VACCINES  Aged Out   Lung Cancer Screening  Discontinued    Health Maintenance  Health Maintenance Due  Topic Date Due   Pneumococcal Vaccine 32-30 Years old (1 of 2 - PCV) Never done   Hepatitis C Screening  Never done   DTaP/Tdap/Td (1 - Tdap) Never done   Zoster Vaccines- Shingrix (1 of 2) Never done   Colonoscopy  Never done   COVID-19 Vaccine (2 - Mixed Product risk series) 04/30/2019   INFLUENZA VACCINE  Never done    Colorectal cancer screening: Referral to GI placed 03/25/2023. Pt aware the office will call re: appt.  Lung Cancer Screening: (Low Dose CT Chest recommended if Age 63-80 years, 20 pack-year currently smoking OR have quit w/in 15years.) does not qualify.   Lung Cancer Screening Referral: N/A  Additional Screening:  Hepatitis C Screening: does qualify;   Vision Screening: Recommended annual ophthalmology exams for early detection of glaucoma and other disorders of the eye. Is the patient up to date with their annual eye exam?  No  Who is the provider or what is the name of the office in which the patient attends annual eye exams? Need a referral If pt is not established with a provider, would they like to be referred to a provider to establish care? Yes .   Dental Screening: Recommended annual dental exams for proper oral hygiene   Community Resource Referral /  Chronic Care Management: CRR required this visit?  No   CCM required this visit?  No    Plan:     I have personally reviewed and noted the following in the patient's chart:   Medical and social history Use of alcohol, tobacco or illicit drugs  Current medications and supplements including opioid prescriptions. Patient is currently taking opioid prescriptions. Information provided to patient regarding non-opioid alternatives. Patient advised to discuss non-opioid treatment plan with their provider. Functional ability and status Nutritional status Physical activity Advanced directives List of other physicians Hospitalizations, surgeries, and ER visits in previous 12 months Vitals Screenings to include cognitive, depression, and falls Referrals and appointments  In addition, I have reviewed and discussed with patient certain preventive protocols, quality metrics, and best practice recommendations. A written personalized care plan for preventive services as well as general preventive health recommendations were provided to patient.     Lenora Gomes L Daissy Yerian, CMA   03/28/2023   After Visit Summary: (In Person-Printed) AVS printed and given to the patient  Nurse Notes: Patient is due for all vaccines.  He is also due colonscopy, however he would like discussing a cologurad during his next visit with Dr. Jonny Ruiz.

## 2023-03-25 NOTE — Telephone Encounter (Signed)
Sorry no - too soon as he was given #90 for tid prn dosing on Jan 8

## 2023-03-25 NOTE — Telephone Encounter (Signed)
Copied from CRM [id number]. Topic: Clinical - Prescription Issue >> Mar 25, 2023  2:49 PM Dennison Nancy wrote: Reason for CRM: Patient checking on status of the medication refill for the  oxyCODONE-acetaminophen (PERCOCET) 7.5-325 MG tablet/ Patient use to be on percocet 10 mg wished help his pain while taking the 7.5 only last about 4 hours  Want to know if Dr. Oliver Barre if may a finally decision on his prescription  Patient stated even though Dr. Jonny Ruiz prescribed 3 refills ,asking if Dr. Jonny Ruiz Can do another refill, have to reschedule an appointment with pain management and schedule another appointment with transportation and have to give transportation 3 days in advance ,  Patient is disable stated he is in pain with back and legs and patient is out of the medication

## 2023-03-25 NOTE — Telephone Encounter (Signed)
Sorry, I have to decline as he likely has a pain contract with the pain clinic, thanks

## 2023-03-25 NOTE — Telephone Encounter (Signed)
Patient does not have a contract with the pain clinic yet he is unable to get to the pain clinic due to transportation he has to do urine sample  at the pain clinic  the pain clinic will not have a contract with patient until that is done and he just needs some pain meds until next month when he is able to take the urine test  he has two cracked teeth and back pain dosage 10 g pain med is what he stated he needed the other dosage is not doing anything for his pain patient is in a lot of pain

## 2023-03-25 NOTE — Telephone Encounter (Signed)
Patient stated that he has been taking more then every 8 hours due to pain and dosage

## 2023-03-26 ENCOUNTER — Other Ambulatory Visit: Payer: Self-pay | Admitting: Podiatry

## 2023-03-28 ENCOUNTER — Telehealth: Payer: Self-pay

## 2023-03-28 NOTE — Telephone Encounter (Signed)
Called and unable to leave voicemail

## 2023-03-28 NOTE — Telephone Encounter (Signed)
No medication until he can get that done per our discharge instructions. He contacted his PCP the day after our visit asking for more medication as well, which is a violation of his contract already, but I can understand and excuse it since we are just getting established. If he wants to get the UDS done at a different [facility] before his next appointment, that is fine but I am not prescribing until that is completed. We are also still waiting for his records from [place].

## 2023-03-28 NOTE — Telephone Encounter (Signed)
Patient called and he misunderstood when he was told that [facility] was down the hall for his UDS.  I discharged the patient that day and told him [facility] was down the hall on the right and he stated that his ride had arrived and I told him he had 24 hours to get the UDS done.

## 2023-03-29 DIAGNOSIS — G894 Chronic pain syndrome: Secondary | ICD-10-CM

## 2023-03-29 HISTORY — DX: Chronic pain syndrome: G89.4

## 2023-03-30 NOTE — Telephone Encounter (Signed)
He has been seen by pain management and he should contact them to refill this.

## 2023-04-07 ENCOUNTER — Ambulatory Visit (INDEPENDENT_AMBULATORY_CARE_PROVIDER_SITE_OTHER): Payer: Medicare HMO | Admitting: Internal Medicine

## 2023-04-07 ENCOUNTER — Encounter: Payer: Self-pay | Admitting: Internal Medicine

## 2023-04-07 VITALS — BP 122/76 | HR 94 | Temp 98.5°F | Ht 70.0 in

## 2023-04-07 DIAGNOSIS — G894 Chronic pain syndrome: Secondary | ICD-10-CM

## 2023-04-07 DIAGNOSIS — E782 Mixed hyperlipidemia: Secondary | ICD-10-CM

## 2023-04-07 DIAGNOSIS — Z1159 Encounter for screening for other viral diseases: Secondary | ICD-10-CM | POA: Diagnosis not present

## 2023-04-07 DIAGNOSIS — Z0000 Encounter for general adult medical examination without abnormal findings: Secondary | ICD-10-CM | POA: Diagnosis not present

## 2023-04-07 DIAGNOSIS — I10 Essential (primary) hypertension: Secondary | ICD-10-CM

## 2023-04-07 DIAGNOSIS — M5442 Lumbago with sciatica, left side: Secondary | ICD-10-CM | POA: Diagnosis not present

## 2023-04-07 DIAGNOSIS — F17200 Nicotine dependence, unspecified, uncomplicated: Secondary | ICD-10-CM

## 2023-04-07 DIAGNOSIS — G8929 Other chronic pain: Secondary | ICD-10-CM

## 2023-04-07 DIAGNOSIS — I739 Peripheral vascular disease, unspecified: Secondary | ICD-10-CM

## 2023-04-07 DIAGNOSIS — Z0001 Encounter for general adult medical examination with abnormal findings: Secondary | ICD-10-CM

## 2023-04-07 MED ORDER — TRAMADOL HCL 50 MG PO TABS: 50.0000 mg | ORAL_TABLET | Freq: Four times a day (QID) | ORAL | 3 refills | Status: DC | PRN

## 2023-04-07 MED ORDER — CYCLOBENZAPRINE HCL 5 MG PO TABS: 5.0000 mg | ORAL_TABLET | Freq: Three times a day (TID) | ORAL | 5 refills | Status: DC | PRN

## 2023-04-07 MED ORDER — GABAPENTIN 100 MG PO CAPS: 100.0000 mg | ORAL_CAPSULE | Freq: Three times a day (TID) | ORAL | 5 refills | Status: DC

## 2023-04-07 MED ORDER — CILOSTAZOL 100 MG PO TABS: 100.0000 mg | ORAL_TABLET | Freq: Two times a day (BID) | ORAL | 11 refills | Status: DC

## 2023-04-07 NOTE — Progress Notes (Signed)
 Patient ID: Benjamin Espinoza, male   DOB: Apr 16, 1965, 58 y.o.   MRN: [id number]         Chief Complaint:: wellness exam and Medical Management of Chronic Issues (Referral for pain management and swelling in right leg , pain in lower left side of his back , would like a MRI on his back )  , pain management, PAD, hld, htn, smoker       HPI:  Benjamin Espinoza is a 58 y.o. male here for wellness exam; declines all imunizations, due for hep c screenin, declines colonoscopy o/w up to date; still smoking, not ready to quit.                         Also apparently some mixup with his pain management, apparently did not accomplish his UDS; pt states he believes he just needs a referral back to them to clear up the misunderstanding, but o/w asking for further bridging meds such as oxycodone  at higher dosing.  Has also 2 wks worsening left lower back pain with persistent scitaica now also with worsening left leg weakness and pain, also asking for flexeril  and gabapentin , and repeat MRI and surgical eval though has been very hesitant in the past for any procedure such as ESI or surgury consideration.. Also needs pletal  refill.  Has much improved RLE claudication pain post procedure, though has some minor swelling to the feet later in the day, better in the AM to wake up.     Wt Readings from Last 3 Encounters:  03/25/23 208 lb (94.3 kg)  03/23/23 212 lb (96.2 kg)  01/10/23 205 lb (93 kg)   BP Readings from Last 3 Encounters:  04/07/23 122/76  03/23/23 131/88  01/10/23 (!) 140/92   Immunization History  Administered Date(s) Administered   Unspecified SARS-COV-2 Vaccination 04/02/2019   Health Maintenance Due  Topic Date Due   Pneumococcal Vaccine 73-58 Years old (1 of 2 - PCV) Never done   Hepatitis C Screening  Never done   DTaP/Tdap/Td (1 - Tdap) Never done   Zoster Vaccines- Shingrix (1 of 2) Never done   Colonoscopy  Never done   COVID-19 Vaccine (2 - Mixed Product risk series) 04/30/2019    INFLUENZA VACCINE  Never done      Past Medical History:  Diagnosis Date   Abnormal stress test    Anxiety    Asthma    as a child   Atypical chest pain 05/07/2017   Coronary artery disease    DDD (degenerative disc disease), lumbar 11/08/2019   Formatting of this note might be different from the original. 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 [place]    Hyperlipidemia    Myocardial infarction (HCC) 2017   PVD (peripheral vascular disease) (HCC) 09/21/2019   Shortness of breath    Tobacco use disorder    Past Surgical History:  Procedure Laterality Date   ABDOMINAL AORTOGRAM W/LOWER EXTREMITY Bilateral 09/21/2019   Procedure: ABDOMINAL AORTOGRAM W/LOWER EXTREMITY;  Surgeon: Eliza Lonni GORMAN, MD;  Location: [facility] INVASIVE CV LAB;  Service: Cardiovascular;  Laterality: Bilateral;   ABDOMINAL AORTOGRAM W/LOWER EXTREMITY N/A 12/07/2019   Procedure: ABDOMINAL AORTOGRAM W/LOWER EXTREMITY;  Surgeon: Eliza Lonni GORMAN, MD;  Location: [facility] INVASIVE CV LAB;  Service: Cardiovascular;  Laterality: N/A;   ABDOMINAL AORTOGRAM W/LOWER EXTREMITY N/A 09/09/2022   Procedure: ABDOMINAL AORTOGRAM W/LOWER EXTREMITY;  Surgeon: Gretta Lonni PARAS, MD;  Location:  [facility] INVASIVE CV LAB;  Service: Vascular;  Laterality: N/A;   CARDIAC CATHETERIZATION     CORONARY ARTERY BYPASS GRAFT     quadruple bypas 2017   CORONARY STENT INTERVENTION N/A 11/26/2019   Procedure: CORONARY STENT INTERVENTION;  Surgeon: Mady Bruckner, MD;  Location: [facility] INVASIVE CV LAB;  Service: Cardiovascular;  Laterality: N/A;   FEMORAL-POPLITEAL BYPASS GRAFT Right 12/25/2019   Procedure: BYPASS GRAFT FEMORAL- BELOW KNEE POPLITEAL ARTERY RIGHT USING GORE PROPATEN VASCULAR GRAFT;  Surgeon: Eliza Bruckner RAMAN, MD;  Location: [facility] OR;  Service: Vascular;  Laterality: Right;   FEMORAL-POPLITEAL BYPASS GRAFT Right 09/21/2022   Procedure: REDO RIGHT LOWER EXTREMITY BYPASS USING GORE PROPATEN 6mm REMOVALBLE  RING GRAFT;  Surgeon: Eliza Bruckner RAMAN, MD;  Location: [facility] OR;  Service: Vascular;  Laterality: Right;   INTRAOPERATIVE ARTERIOGRAM Right 09/21/2022   Procedure: INTRA OPERATIVE ARTERIOGRAM;  Surgeon: Eliza Bruckner RAMAN, MD;  Location: [facility] OR;  Service: Vascular;  Laterality: Right;   LEFT HEART CATH AND CORS/GRAFTS ANGIOGRAPHY N/A 11/26/2019   Procedure: LEFT HEART CATH AND CORS/GRAFTS ANGIOGRAPHY;  Surgeon: Mady Bruckner, MD;  Location: [facility] INVASIVE CV LAB;  Service: Cardiovascular;  Laterality: N/A;   LEFT HEART CATH AND CORS/GRAFTS ANGIOGRAPHY N/A 12/01/2020   Procedure: LEFT HEART CATH AND CORS/GRAFTS ANGIOGRAPHY;  Surgeon: Burnard Debby LABOR, MD;  Location: [facility] INVASIVE CV LAB;  Service: Cardiovascular;  Laterality: N/A;   LOWER EXTREMITY ANGIOGRAM Right 12/25/2019   Procedure: RIGHT LOWER EXTREMITY ANGIOGRAM;  Surgeon: Eliza Bruckner RAMAN, MD;  Location: [facility] OR;  Service: Vascular;  Laterality: Right;   PERIPHERAL VASCULAR BALLOON ANGIOPLASTY Left 12/07/2019   Procedure: PERIPHERAL VASCULAR BALLOON ANGIOPLASTY;  Surgeon: Eliza Bruckner RAMAN, MD;  Location: [facility] INVASIVE CV LAB;  Service: Cardiovascular;  Laterality: Left;  SFA    reports that he has been smoking cigars and cigarettes. He has been exposed to tobacco smoke. He has never used smokeless tobacco. He reports current drug use. Drug: Marijuana. He reports that he does not drink alcohol. family history includes Heart disease in his father, mother, and sister. Allergies  Allergen Reactions   Imdur  [Isosorbide  Nitrate] Other (See Comments)    Severe headache   Isosorbide      Other Reaction(s): Other (See Comments)  Severe headache   Current Outpatient Medications on File Prior to Visit  Medication Sig Dispense Refill   aspirin  EC 81 MG tablet Take 1 tablet (81 mg total) by mouth daily.     atorvastatin  (LIPITOR ) 80 MG tablet Take 1 tablet (80 mg total) by mouth daily. 30 tablet 11   carvedilol  (COREG ) 3.125 MG tablet Take  1 tablet (3.125 mg total) by mouth 2 (two) times daily with a meal. 60 tablet 11   losartan  (COZAAR ) 50 MG tablet Take 1 tablet (50 mg total) by mouth daily. 30 tablet 11   ranolazine  (RANEXA ) 1000 MG SR tablet Take 1 tablet (1,000 mg total) by mouth 2 (two) times daily. 60 tablet 11   solifenacin  (VESICARE ) 5 MG tablet Take 1 tablet (5 mg total) by mouth daily. 30 tablet 11   No current facility-administered medications on file prior to visit.        ROS:  All others reviewed and negative.  Objective        PE:  BP 122/76 (BP Location: Right Arm, Patient Position: Sitting, Cuff Size: Normal)   Pulse 94   Temp 98.5 F (36.9 C) (Oral)   Ht 5' 10 (1.778 m)   SpO2 98%   BMI  29.84 kg/m                 Constitutional: Pt appears in NAD               HENT: Head: NCAT.                Right Ear: External ear normal.                 Left Ear: External ear normal.                Eyes: . Pupils are equal, round, and reactive to light. Conjunctivae and EOM are normal               Nose: without d/c or deformity               Neck: Neck supple. Gross normal ROM               Cardiovascular: Normal rate and regular rhythm.                 Pulmonary/Chest: Effort normal and breath sounds without rales or wheezing.                Abd:  Soft, NT, ND, + BS, no organomegaly               Neurological: Pt is alert. At baseline orientation, motor with 4/5 LLE weakness, walks with cane               Skin: Skin is warm. No rashes, no other new lesions, LE edema - trace right pedal               Psychiatric: Pt behavior is normal without agitation   Micro: none  Cardiac tracings I have personally interpreted today:  none  Pertinent Radiological findings (summarize): none   Lab Results  Component Value Date   WBC 8.3 10/29/2022   HGB 12.3 (L) 10/29/2022   HCT 37.7 (L) 10/29/2022   PLT 266 10/29/2022   GLUCOSE 87 10/29/2022   CHOL 164 09/22/2022   TRIG 66 09/22/2022   HDL 37 (L) 09/22/2022    LDLCALC 114 (H) 09/22/2022   ALT 30 09/21/2022   AST 22 09/21/2022   NA 139 10/29/2022   K 3.7 10/29/2022   CL 103 10/29/2022   CREATININE 1.58 (H) 10/29/2022   BUN 9 10/29/2022   CO2 26 10/29/2022   TSH 1.909 11/29/2020   INR 1.0 09/21/2022   HGBA1C 5.3 11/29/2020   Assessment/Plan:  Benjamin Espinoza is a 58 y.o. Black or African American [2] male with  has a past medical history of Abnormal stress test, Anxiety, Asthma, Atypical chest pain (05/07/2017), Coronary artery disease, DDD (degenerative disc disease), lumbar (11/08/2019), Essential hypertension, CABG (x4 01/14/2016 [place] ), Hyperlipidemia, Myocardial infarction (HCC) (2017), PVD (peripheral vascular disease) (HCC) (09/21/2019), Shortness of breath, and Tobacco use disorder.  Encounter for well adult exam with abnormal findings Age and sex appropriate education and counseling updated with regular exercise and diet Referrals for preventative services - for hep c screen, declines colonoscopy Immunizations addressed - decliens all Smoking counseling  - pt counsled to quit, pt not ready Evidence for depression or other mood disorder - none significant Most recent labs reviewed. I have personally reviewed and have noted: 1) the patient's medical and social history 2) The patient's current medications and supplements 3) The patient's height, weight, and BMI have been recorded in the chart  Chronic bilateral low back pain with left-sided sciatica With worsening subjective pain and LLE weakness - for MRI LS spine and NS referral, for restart flexeril  prn, and gabapentin  300 tid;  also for tramadol  qid prn, as he is reminded I would not provide level 2 or higher controlled substances beyond 3 mo as I do not treat chronic pain.   Chronic pain Pt for referral , possible to previous provider though I suspect he may have been dismissed already for non compliance  PAD (peripheral artery disease) (HCC) Stable RLE - fro pletal   refill  Essential hypertension BP Readings from Last 3 Encounters:  04/07/23 122/76  03/23/23 131/88  01/10/23 (!) 140/92   Stable, pt to continue medical treatment coreg  3.125 bid, losartan  50 every day,     Hyperlipidemia Lab Results  Component Value Date   LDLCALC 114 (H) 09/22/2022   uncontrolled, pt to continue current statinlipit 80 every day, encouraged compliance, and recheck lipids   Tobacco use disorder Pt counsled to quit, pt not ready  Chronic pain syndrome Pt for referral , possible to previous provider though I suspect he may have been dismissed already for non compliance  Followup: Return in about 6 months (around 10/05/2023).  Lynwood Rush, MD 04/08/2023 8:43 PM [facility]

## 2023-04-07 NOTE — Progress Notes (Deleted)
 Patient ID: Benjamin Espinoza, male   DOB: 06/05/1965, 58 y.o.   MRN: [id number]

## 2023-04-07 NOTE — Patient Instructions (Addendum)
 Please take all new medication as prescribed - the tramadol , and muscle relaxer  Please continue all other medications as before, and refills have been done if requested - gabapentin  and pletal   Please have the pharmacy call with any other refills you may need.  Please continue your efforts at being more active, low cholesterol diet, and weight control.  You are otherwise up to date with prevention measures today.  Please keep your appointments with your specialists as you may have planned  You will be contacted regarding the referral for: MRI and Neursurgury  You will be contacted regarding the referral for: Pain management  Please go to the LAB at the blood drawing area for the tests to be done  You will be contacted by phone if any changes need to be made immediately.  Otherwise, you will receive a letter about your results with an explanation, but please check with MyChart first.  Please make an Appointment to return in 6 months, or sooner if needed

## 2023-04-08 ENCOUNTER — Encounter: Payer: Self-pay | Admitting: Internal Medicine

## 2023-04-08 ENCOUNTER — Emergency Department
Admit: 2023-04-08 | Discharge: 2023-04-08 | Disposition: A | Discharge status: Home / Self Care | Payer: MEDICARE | Attending: Family Medicine

## 2023-04-08 DIAGNOSIS — Z0001 Encounter for general adult medical examination with abnormal findings: Secondary | ICD-10-CM

## 2023-04-08 DIAGNOSIS — I739 Peripheral vascular disease, unspecified: Secondary | ICD-10-CM | POA: Diagnosis not present

## 2023-04-08 DIAGNOSIS — G8929 Other chronic pain: Secondary | ICD-10-CM | POA: Insufficient documentation

## 2023-04-08 DIAGNOSIS — F1290 Cannabis use, unspecified, uncomplicated: Secondary | ICD-10-CM | POA: Diagnosis not present

## 2023-04-08 DIAGNOSIS — J449 Chronic obstructive pulmonary disease, unspecified: Secondary | ICD-10-CM | POA: Diagnosis not present

## 2023-04-08 DIAGNOSIS — M25561 Pain in right knee: Secondary | ICD-10-CM | POA: Diagnosis not present

## 2023-04-08 DIAGNOSIS — I2510 Atherosclerotic heart disease of native coronary artery without angina pectoris: Secondary | ICD-10-CM | POA: Diagnosis not present

## 2023-04-08 DIAGNOSIS — M545 Acute exacerbation of chronic low back pain: Principal | ICD-10-CM

## 2023-04-08 DIAGNOSIS — I252 Old myocardial infarction: Secondary | ICD-10-CM | POA: Diagnosis not present

## 2023-04-08 DIAGNOSIS — F17210 Nicotine dependence, cigarettes, uncomplicated: Secondary | ICD-10-CM | POA: Diagnosis not present

## 2023-04-08 DIAGNOSIS — R6889 Other general symptoms and signs: Secondary | ICD-10-CM | POA: Diagnosis not present

## 2023-04-08 DIAGNOSIS — I110 Hypertensive heart disease with heart failure: Secondary | ICD-10-CM | POA: Diagnosis not present

## 2023-04-08 DIAGNOSIS — M549 Dorsalgia, unspecified: Secondary | ICD-10-CM | POA: Diagnosis not present

## 2023-04-08 DIAGNOSIS — W19XXXA Unspecified fall, initial encounter: Secondary | ICD-10-CM | POA: Diagnosis not present

## 2023-04-08 DIAGNOSIS — I509 Heart failure, unspecified: Secondary | ICD-10-CM | POA: Diagnosis not present

## 2023-04-08 HISTORY — DX: Encounter for general adult medical examination with abnormal findings: Z00.01

## 2023-04-08 MED ORDER — OXYCODONE-ACETAMINOPHEN 7.5 MG-325 MG TABLET
ORAL_TABLET | Freq: Three times a day (TID) | ORAL | 0 refills | 4 days | Status: CP | PRN
Start: 2023-04-08 — End: 2023-04-13

## 2023-04-08 NOTE — Assessment & Plan Note (Signed)
 Lab Results  Component Value Date   LDLCALC 114 (H) 09/22/2022   uncontrolled, pt to continue current statinlipit 80 every day, encouraged compliance, and recheck lipids

## 2023-04-08 NOTE — Assessment & Plan Note (Signed)
 Pt for referral , possible to previous provider though I suspect he may have been dismissed already for non compliance

## 2023-04-08 NOTE — Assessment & Plan Note (Signed)
 Stable RLE - fro pletal  refill

## 2023-04-08 NOTE — Assessment & Plan Note (Signed)
 Age and sex appropriate education and counseling updated with regular exercise and diet Referrals for preventative services - for hep c screen, declines colonoscopy Immunizations addressed - decliens all Smoking counseling  - pt counsled to quit, pt not ready Evidence for depression or other mood disorder - none significant Most recent labs reviewed. I have personally reviewed and have noted: 1) the patient's medical and social history 2) The patient's current medications and supplements 3) The patient's height, weight, and BMI have been recorded in the chart

## 2023-04-08 NOTE — Assessment & Plan Note (Signed)
 Pt counsled to quit, pt not ready

## 2023-04-08 NOTE — Assessment & Plan Note (Addendum)
 With worsening subjective pain and LLE weakness - for MRI LS spine and NS referral, for restart flexeril  prn, and gabapentin  300 tid;  also for tramadol  qid prn, as he is reminded I would not provide level 2 or higher controlled substances beyond 3 mo as I do not treat chronic pain.

## 2023-04-08 NOTE — Assessment & Plan Note (Signed)
 BP Readings from Last 3 Encounters:  04/07/23 122/76  03/23/23 131/88  01/10/23 (!) 140/92   Stable, pt to continue medical treatment coreg  3.125 bid, losartan  50 every day,

## 2023-04-11 ENCOUNTER — Encounter: Payer: Self-pay | Admitting: Internal Medicine

## 2023-04-11 DIAGNOSIS — I10 Essential (primary) hypertension: Secondary | ICD-10-CM | POA: Diagnosis not present

## 2023-04-11 DIAGNOSIS — I2510 Atherosclerotic heart disease of native coronary artery without angina pectoris: Secondary | ICD-10-CM | POA: Diagnosis not present

## 2023-04-11 DIAGNOSIS — R079 Chest pain, unspecified: Secondary | ICD-10-CM | POA: Diagnosis not present

## 2023-04-12 ENCOUNTER — Encounter: Payer: Self-pay | Admitting: Internal Medicine

## 2023-04-12 ENCOUNTER — Telehealth: Payer: Self-pay

## 2023-04-12 NOTE — Telephone Encounter (Signed)
Copied from CRM [id number]. Topic: General - Other >> Apr 12, 2023  8:58 AM Adele Barthel wrote: Reason for CRM: Marena Chancy from [facility] contacted office to report that the provider patient was referred to is not writing prescriptions at this time and the patient will need a new referral. CB# [phone] ext [phone]

## 2023-04-13 ENCOUNTER — Encounter: Payer: Self-pay | Admitting: Internal Medicine

## 2023-04-14 ENCOUNTER — Telehealth: Payer: Self-pay

## 2023-04-14 ENCOUNTER — Encounter: Payer: Self-pay | Admitting: Internal Medicine

## 2023-04-14 ENCOUNTER — Telehealth: Payer: Self-pay | Admitting: Internal Medicine

## 2023-04-14 DIAGNOSIS — G894 Chronic pain syndrome: Secondary | ICD-10-CM | POA: Diagnosis not present

## 2023-04-14 DIAGNOSIS — Z79891 Long term (current) use of opiate analgesic: Secondary | ICD-10-CM | POA: Diagnosis not present

## 2023-04-14 DIAGNOSIS — Z5181 Encounter for therapeutic drug level monitoring: Secondary | ICD-10-CM

## 2023-04-14 NOTE — Telephone Encounter (Signed)
Drug screening performed, oral swab

## 2023-04-14 NOTE — Telephone Encounter (Signed)
Pt has already been referred - ok to forward this to [facility]

## 2023-04-14 NOTE — Telephone Encounter (Signed)
Copied from CRM [id number]. Topic: Referral - Request for Referral >> Apr 14, 2023  2:11 PM Sim Boast F wrote: Did the patient discuss referral with their provider in the last year? Yes (If No - schedule appointment) (If Yes - send message)  Appointment offered? No  Type of order/referral and detailed reason for visit: Patient has been off pain meds for a month, and is still having back pain, and pain in both ankles, and right knee  Preference of office, provider, location: [facility]   If referral order, have you been seen by this specialty before? Yes, [facility] but they want to schedule procedure and patient doesn't want too  (If Yes, this issue or another issue? When? Where?  Can we respond through MyChart? No

## 2023-04-15 ENCOUNTER — Telehealth: Payer: Self-pay

## 2023-04-15 ENCOUNTER — Encounter: Payer: Self-pay | Admitting: Internal Medicine

## 2023-04-15 NOTE — Telephone Encounter (Signed)
I am not sure what I need to do with this information.  This is noted, and will otherwise continue care as per last OV

## 2023-04-15 NOTE — Telephone Encounter (Signed)
Copied from CRM [id number]. Topic: Clinical - Medical Advice >> Apr 15, 2023 10:30 AM Alcus Dad wrote: Reason for CRM: Synethia from [facility] called back to state that [employer] hasn't approved patient for MRI. Waiting on Dr. Jonny Ruiz to call her

## 2023-04-15 NOTE — Telephone Encounter (Signed)
Copied from CRM [id number]. Topic: Clinical - Request for Lab/Test Order >> Apr 15, 2023  9:38 AM Isabell A wrote: Reason for CRM: Synethia from [facility] is calling to inform the doctor the MRI for lumbar spine w/ contrast has been denied from the insurance - patients appointment for tomorrow 2/15 has been cancelled.   Callback number: [phone]

## 2023-04-16 ENCOUNTER — Other Ambulatory Visit: Payer: Medicare HMO

## 2023-04-19 LAB — DRUG TOX MONITOR 1 W/CONF, ORAL FLD
Amphetamines: NEGATIVE ng/mL (ref <10)
Barbiturates: NEGATIVE ng/mL (ref <10)
Benzodiazepines: NEGATIVE ng/mL (ref <0.50)
Benzoylecgonine: NEGATIVE ng/mL (ref <5.0)
Buprenorphine: NEGATIVE ng/mL (ref <0.10)
Cocaine: 8.4 ng/mL — ABNORMAL HIGH (ref <5.0)
Cocaine: POSITIVE ng/mL — AB (ref <5.0)
Cotinine: 222.7 ng/mL — ABNORMAL HIGH (ref <5.0)
Fentanyl: NEGATIVE ng/mL (ref <0.10)
Heroin Metabolite: NEGATIVE ng/mL (ref <1.0)
MARIJUANA: POSITIVE ng/mL — AB (ref <2.5)
MDMA: NEGATIVE ng/mL (ref <10)
Meprobamate: NEGATIVE ng/mL (ref <2.5)
Methadone: NEGATIVE ng/mL (ref <5.0)
Nicotine Metabolite: POSITIVE ng/mL — AB (ref <5.0)
Opiates: NEGATIVE ng/mL (ref <2.5)
Phencyclidine: NEGATIVE ng/mL (ref <10)
THC: 33.7 ng/mL — ABNORMAL HIGH (ref <2.5)
Tapentadol: NEGATIVE ng/mL (ref <5.0)
Tramadol: NEGATIVE ng/mL (ref <5.0)
Zolpidem: NEGATIVE ng/mL (ref <5.0)

## 2023-04-19 LAB — DRUG TOX ALC METAB W/CON, ORAL FLD: Alcohol Metabolite: NEGATIVE ng/mL (ref <25)

## 2023-04-20 ENCOUNTER — Encounter: Payer: Medicare HMO | Admitting: Registered Nurse

## 2023-04-20 DIAGNOSIS — E7800 Pure hypercholesterolemia, unspecified: Secondary | ICD-10-CM | POA: Diagnosis not present

## 2023-04-20 DIAGNOSIS — I509 Heart failure, unspecified: Secondary | ICD-10-CM | POA: Diagnosis not present

## 2023-04-20 DIAGNOSIS — R5383 Other fatigue: Secondary | ICD-10-CM | POA: Diagnosis not present

## 2023-04-20 DIAGNOSIS — Z113 Encounter for screening for infections with a predominantly sexual mode of transmission: Secondary | ICD-10-CM | POA: Diagnosis not present

## 2023-04-20 DIAGNOSIS — M129 Arthropathy, unspecified: Secondary | ICD-10-CM | POA: Diagnosis not present

## 2023-04-20 DIAGNOSIS — Z79899 Other long term (current) drug therapy: Secondary | ICD-10-CM | POA: Diagnosis not present

## 2023-04-20 DIAGNOSIS — I10 Essential (primary) hypertension: Secondary | ICD-10-CM | POA: Diagnosis not present

## 2023-04-20 DIAGNOSIS — Z125 Encounter for screening for malignant neoplasm of prostate: Secondary | ICD-10-CM | POA: Diagnosis not present

## 2023-04-20 DIAGNOSIS — N183 Chronic kidney disease, stage 3 unspecified: Secondary | ICD-10-CM | POA: Diagnosis not present

## 2023-04-20 DIAGNOSIS — E559 Vitamin D deficiency, unspecified: Secondary | ICD-10-CM | POA: Diagnosis not present

## 2023-04-20 DIAGNOSIS — Z114 Encounter for screening for human immunodeficiency virus [HIV]: Secondary | ICD-10-CM | POA: Diagnosis not present

## 2023-04-22 DIAGNOSIS — Z79899 Other long term (current) drug therapy: Secondary | ICD-10-CM | POA: Diagnosis not present

## 2023-04-24 NOTE — Progress Notes (Signed)
 Patient has been offered non-narcotic management only with our office already; see addendum from first visit. Thank you.

## 2023-04-25 DIAGNOSIS — R0602 Shortness of breath: Secondary | ICD-10-CM | POA: Diagnosis not present

## 2023-04-25 DIAGNOSIS — Z6829 Body mass index (BMI) 29.0-29.9, adult: Secondary | ICD-10-CM | POA: Diagnosis not present

## 2023-04-25 DIAGNOSIS — F17210 Nicotine dependence, cigarettes, uncomplicated: Secondary | ICD-10-CM | POA: Diagnosis not present

## 2023-04-25 DIAGNOSIS — I739 Peripheral vascular disease, unspecified: Secondary | ICD-10-CM | POA: Diagnosis not present

## 2023-04-25 DIAGNOSIS — N183 Chronic kidney disease, stage 3 unspecified: Secondary | ICD-10-CM | POA: Diagnosis not present

## 2023-04-25 DIAGNOSIS — M545 Low back pain, unspecified: Secondary | ICD-10-CM | POA: Diagnosis not present

## 2023-04-25 DIAGNOSIS — E7800 Pure hypercholesterolemia, unspecified: Secondary | ICD-10-CM | POA: Diagnosis not present

## 2023-04-25 DIAGNOSIS — Z79899 Other long term (current) drug therapy: Secondary | ICD-10-CM | POA: Diagnosis not present

## 2023-04-25 DIAGNOSIS — E559 Vitamin D deficiency, unspecified: Secondary | ICD-10-CM | POA: Diagnosis not present

## 2023-04-25 DIAGNOSIS — I10 Essential (primary) hypertension: Secondary | ICD-10-CM | POA: Diagnosis not present

## 2023-04-25 DIAGNOSIS — G8929 Other chronic pain: Secondary | ICD-10-CM | POA: Diagnosis not present

## 2023-04-27 ENCOUNTER — Emergency Department: Admit: 2023-04-27 | Discharge: 2023-04-27 | Disposition: A | Discharge status: Home / Self Care | Payer: MEDICARE

## 2023-04-27 DIAGNOSIS — M25562 Pain in left knee: Principal | ICD-10-CM

## 2023-04-27 DIAGNOSIS — I110 Hypertensive heart disease with heart failure: Secondary | ICD-10-CM | POA: Diagnosis not present

## 2023-04-27 DIAGNOSIS — Z951 Presence of aortocoronary bypass graft: Secondary | ICD-10-CM | POA: Diagnosis not present

## 2023-04-27 DIAGNOSIS — R000 Tachycardia, unspecified: Secondary | ICD-10-CM | POA: Diagnosis not present

## 2023-04-27 DIAGNOSIS — W19XXXA Unspecified fall, initial encounter: Secondary | ICD-10-CM | POA: Diagnosis not present

## 2023-04-27 DIAGNOSIS — I509 Heart failure, unspecified: Secondary | ICD-10-CM | POA: Diagnosis not present

## 2023-04-27 DIAGNOSIS — F17210 Nicotine dependence, cigarettes, uncomplicated: Secondary | ICD-10-CM | POA: Diagnosis not present

## 2023-04-27 DIAGNOSIS — J4489 Other specified chronic obstructive pulmonary disease: Secondary | ICD-10-CM | POA: Diagnosis not present

## 2023-04-27 DIAGNOSIS — E785 Hyperlipidemia, unspecified: Secondary | ICD-10-CM | POA: Diagnosis not present

## 2023-04-27 DIAGNOSIS — Z7901 Long term (current) use of anticoagulants: Secondary | ICD-10-CM | POA: Diagnosis not present

## 2023-04-27 DIAGNOSIS — Z79899 Other long term (current) drug therapy: Secondary | ICD-10-CM | POA: Diagnosis not present

## 2023-04-27 DIAGNOSIS — Z7902 Long term (current) use of antithrombotics/antiplatelets: Secondary | ICD-10-CM | POA: Diagnosis not present

## 2023-04-27 DIAGNOSIS — M1990 Unspecified osteoarthritis, unspecified site: Secondary | ICD-10-CM | POA: Diagnosis not present

## 2023-04-27 DIAGNOSIS — Z7982 Long term (current) use of aspirin: Secondary | ICD-10-CM | POA: Diagnosis not present

## 2023-04-27 DIAGNOSIS — I2510 Atherosclerotic heart disease of native coronary artery without angina pectoris: Secondary | ICD-10-CM | POA: Diagnosis not present

## 2023-04-27 DIAGNOSIS — M7989 Other specified soft tissue disorders: Secondary | ICD-10-CM | POA: Diagnosis not present

## 2023-04-27 DIAGNOSIS — Z95820 Peripheral vascular angioplasty status with implants and grafts: Secondary | ICD-10-CM | POA: Diagnosis not present

## 2023-04-27 DIAGNOSIS — Z79620 Long term (current) use of immunosuppressive biologic: Secondary | ICD-10-CM | POA: Diagnosis not present

## 2023-04-28 ENCOUNTER — Other Ambulatory Visit: Payer: Self-pay

## 2023-04-28 ENCOUNTER — Encounter (HOSPITAL_COMMUNITY): Payer: Self-pay

## 2023-04-28 ENCOUNTER — Emergency Department (HOSPITAL_COMMUNITY)
Admission: EM | Admit: 2023-04-28 | Discharge: 2023-04-28 | Discharge status: Against Medical Advice | Payer: Medicare HMO | Attending: Emergency Medicine | Admitting: Emergency Medicine

## 2023-04-28 DIAGNOSIS — R609 Edema, unspecified: Secondary | ICD-10-CM | POA: Diagnosis not present

## 2023-04-28 DIAGNOSIS — M25562 Pain in left knee: Secondary | ICD-10-CM | POA: Diagnosis not present

## 2023-04-28 DIAGNOSIS — Z79899 Other long term (current) drug therapy: Secondary | ICD-10-CM | POA: Diagnosis not present

## 2023-04-28 DIAGNOSIS — I499 Cardiac arrhythmia, unspecified: Secondary | ICD-10-CM | POA: Diagnosis not present

## 2023-04-28 DIAGNOSIS — W19XXXA Unspecified fall, initial encounter: Secondary | ICD-10-CM | POA: Insufficient documentation

## 2023-04-28 DIAGNOSIS — Z5321 Procedure and treatment not carried out due to patient leaving prior to being seen by health care provider: Secondary | ICD-10-CM | POA: Insufficient documentation

## 2023-04-28 NOTE — ED Notes (Signed)
Pt not in room or restroom.

## 2023-04-28 NOTE — ED Triage Notes (Addendum)
 Pt bib ems from home; c/o L knee pain x 3 days; fell on 1/7, seen at [facility] last 2 days for same; pt states PCP requesting MRI; 8/10; 132/80, HR 1-02, 98% RA

## 2023-04-28 NOTE — ED Notes (Signed)
 NA x 3, assumed elopement

## 2023-05-02 DIAGNOSIS — M545 Low back pain, unspecified: Secondary | ICD-10-CM | POA: Diagnosis not present

## 2023-05-02 DIAGNOSIS — Z87891 Personal history of nicotine dependence: Secondary | ICD-10-CM | POA: Diagnosis not present

## 2023-05-04 ENCOUNTER — Other Ambulatory Visit: Payer: Self-pay

## 2023-05-04 ENCOUNTER — Emergency Department (HOSPITAL_COMMUNITY)

## 2023-05-04 ENCOUNTER — Emergency Department (HOSPITAL_COMMUNITY)
Admission: EM | Admit: 2023-05-04 | Discharge: 2023-05-04 | Disposition: A | Discharge status: Home / Self Care | Attending: Emergency Medicine | Admitting: Emergency Medicine

## 2023-05-04 ENCOUNTER — Encounter (HOSPITAL_COMMUNITY): Payer: Self-pay

## 2023-05-04 DIAGNOSIS — I2510 Atherosclerotic heart disease of native coronary artery without angina pectoris: Secondary | ICD-10-CM | POA: Diagnosis not present

## 2023-05-04 DIAGNOSIS — Z7982 Long term (current) use of aspirin: Secondary | ICD-10-CM | POA: Diagnosis not present

## 2023-05-04 DIAGNOSIS — S199XXA Unspecified injury of neck, initial encounter: Secondary | ICD-10-CM | POA: Diagnosis not present

## 2023-05-04 DIAGNOSIS — S0990XA Unspecified injury of head, initial encounter: Secondary | ICD-10-CM | POA: Diagnosis not present

## 2023-05-04 DIAGNOSIS — F17200 Nicotine dependence, unspecified, uncomplicated: Secondary | ICD-10-CM | POA: Insufficient documentation

## 2023-05-04 DIAGNOSIS — I6529 Occlusion and stenosis of unspecified carotid artery: Secondary | ICD-10-CM | POA: Diagnosis not present

## 2023-05-04 DIAGNOSIS — W108XXA Fall (on) (from) other stairs and steps, initial encounter: Secondary | ICD-10-CM | POA: Diagnosis not present

## 2023-05-04 DIAGNOSIS — Z955 Presence of coronary angioplasty implant and graft: Secondary | ICD-10-CM | POA: Diagnosis not present

## 2023-05-04 DIAGNOSIS — Z79899 Other long term (current) drug therapy: Secondary | ICD-10-CM | POA: Diagnosis not present

## 2023-05-04 DIAGNOSIS — S80919A Unspecified superficial injury of unspecified knee, initial encounter: Secondary | ICD-10-CM | POA: Diagnosis not present

## 2023-05-04 DIAGNOSIS — S0003XA Contusion of scalp, initial encounter: Secondary | ICD-10-CM | POA: Diagnosis not present

## 2023-05-04 DIAGNOSIS — W19XXXA Unspecified fall, initial encounter: Secondary | ICD-10-CM

## 2023-05-04 DIAGNOSIS — M25561 Pain in right knee: Secondary | ICD-10-CM | POA: Diagnosis not present

## 2023-05-04 DIAGNOSIS — J45909 Unspecified asthma, uncomplicated: Secondary | ICD-10-CM | POA: Diagnosis not present

## 2023-05-04 DIAGNOSIS — S39012A Strain of muscle, fascia and tendon of lower back, initial encounter: Secondary | ICD-10-CM | POA: Diagnosis not present

## 2023-05-04 DIAGNOSIS — M25562 Pain in left knee: Secondary | ICD-10-CM | POA: Diagnosis not present

## 2023-05-04 DIAGNOSIS — M51369 Other intervertebral disc degeneration, lumbar region without mention of lumbar back pain or lower extremity pain: Secondary | ICD-10-CM | POA: Diagnosis not present

## 2023-05-04 DIAGNOSIS — M48061 Spinal stenosis, lumbar region without neurogenic claudication: Secondary | ICD-10-CM | POA: Diagnosis not present

## 2023-05-04 DIAGNOSIS — I10 Essential (primary) hypertension: Secondary | ICD-10-CM | POA: Insufficient documentation

## 2023-05-04 DIAGNOSIS — M549 Dorsalgia, unspecified: Secondary | ICD-10-CM | POA: Diagnosis not present

## 2023-05-04 DIAGNOSIS — S3992XA Unspecified injury of lower back, initial encounter: Secondary | ICD-10-CM | POA: Diagnosis not present

## 2023-05-04 DIAGNOSIS — M5020 Other cervical disc displacement, unspecified cervical region: Secondary | ICD-10-CM | POA: Diagnosis not present

## 2023-05-04 DIAGNOSIS — I700 Atherosclerosis of aorta: Secondary | ICD-10-CM | POA: Diagnosis not present

## 2023-05-04 LAB — BASIC METABOLIC PANEL
Anion gap: 12 (ref 5–15)
BUN: 10 mg/dL (ref 6–20)
CO2: 22 mmol/L (ref 22–32)
Calcium: 10.6 mg/dL — ABNORMAL HIGH (ref 8.9–10.3)
Chloride: 104 mmol/L (ref 98–111)
Creatinine, Ser: 1.31 mg/dL — ABNORMAL HIGH (ref 0.61–1.24)
GFR, Estimated: >60 mL/min (ref >60)
Glucose, Bld: 101 mg/dL — ABNORMAL HIGH (ref 70–99)
Potassium: 3.7 mmol/L (ref 3.5–5.1)
Sodium: 138 mmol/L (ref 135–145)

## 2023-05-04 LAB — CBC WITH DIFFERENTIAL/PLATELET
Abs Immature Granulocytes: 0.02 10*3/uL (ref 0.00–0.07)
Basophils Absolute: 0.1 10*3/uL (ref 0.0–0.1)
Basophils Relative: 1 %
Eosinophils Absolute: 0.2 10*3/uL (ref 0.0–0.5)
Eosinophils Relative: 2 %
HCT: 48.8 % (ref 39.0–52.0)
Hemoglobin: 16.3 g/dL (ref 13.0–17.0)
Immature Granulocytes: 0 %
Lymphocytes Relative: 36 %
Lymphs Abs: 2.2 10*3/uL (ref 0.7–4.0)
MCH: 31.5 pg (ref 26.0–34.0)
MCHC: 33.4 g/dL (ref 30.0–36.0)
MCV: 94.2 fL (ref 80.0–100.0)
Monocytes Absolute: 0.6 10*3/uL (ref 0.1–1.0)
Monocytes Relative: 10 %
Neutro Abs: 3.1 10*3/uL (ref 1.7–7.7)
Neutrophils Relative %: 51 %
Platelets: 269 10*3/uL (ref 150–400)
RBC: 5.18 MIL/uL (ref 4.22–5.81)
RDW: 14.1 % (ref 11.5–15.5)
WBC: 6.1 10*3/uL (ref 4.0–10.5)
nRBC: 0 % (ref 0.0–0.2)

## 2023-05-04 MED ORDER — KETOROLAC TROMETHAMINE 15 MG/ML IJ SOLN
15.0000 mg | Freq: Once | INTRAMUSCULAR | Status: AC
  Administered 2023-05-04: 15 mg via INTRAMUSCULAR
  Filled 2023-05-04: qty 1

## 2023-05-04 MED ORDER — LIDOCAINE 5 % EX PTCH
1.0000 | MEDICATED_PATCH | CUTANEOUS | Status: DC
  Administered 2023-05-04: 1 via TRANSDERMAL
  Filled 2023-05-04: qty 1

## 2023-05-04 MED ORDER — METHOCARBAMOL 500 MG PO TABS: 500.0000 mg | ORAL_TABLET | Freq: Two times a day (BID) | ORAL | 0 refills | Status: DC

## 2023-05-04 MED ORDER — LIDOCAINE 5 % EX PTCH: 1.0000 | MEDICATED_PATCH | CUTANEOUS | 0 refills | Status: DC

## 2023-05-04 NOTE — ED Provider Notes (Signed)
 [name] EMERGENCY DEPARTMENT AT [facility] Provider Note   CSN: [id number] Arrival date & time: 05/04/23  7829     History  Chief Complaint  Patient presents with   Fall on Thinners    Benjamin Espinoza is a 58 y.o. male.  59 year old male presents today for concern of fall.  He states his knees gave out on him as he was going down a flight of steps.  He fell backwards directly onto his back.  He was unsure if he hit his head.  He is on Xarelto.  Last dose yesterday.  No loss of consciousness.  He does follow with [facility] medical and states he has an appointment coming up for pain management on 3/12.  The history is provided by the patient. No language interpreter was used.       Home Medications Prior to Admission medications   Medication Sig Start Date End Date Taking? Authorizing Provider  aspirin EC 81 MG tablet Take 1 tablet (81 mg total) by mouth daily. 09/23/22  Yes Rhyne, Ames Coupe, PA-C  atorvastatin (LIPITOR) 80 MG tablet Take 1 tablet (80 mg total) by mouth daily. 01/10/23  Yes Corwin Levins, MD  carvedilol (COREG) 3.125 MG tablet Take 1 tablet (3.125 mg total) by mouth 2 (two) times daily with a meal. 01/10/23  Yes Corwin Levins, MD  cilostazol (PLETAL) 100 MG tablet Take 1 tablet (100 mg total) by mouth 2 (two) times daily. 04/07/23  Yes Corwin Levins, MD  gabapentin (NEURONTIN) 300 MG capsule Take 300 mg by mouth 3 (three) times daily. 04/25/23  Yes [provider]  lidocaine (LIDODERM) 5 % Place 1 patch onto the skin daily. Remove & Discard patch within 12 hours or as directed by MD 05/04/23  Yes Karie Mainland, Lance [place]ar[place], PA-C  losartan (COZAAR) 50 MG tablet Take 1 tablet (50 mg total) by mouth daily. 01/10/23  Yes Corwin Levins, MD  methocarbamol (ROBAXIN) 500 MG tablet Take 1 tablet (500 mg total) by mouth 2 (two) times daily. 05/04/23  Yes Karie Mainland, Cleatus Goodin, PA-C  oxyCODONE-acetaminophen (PERCOCET) 7.5-325 MG tablet Take 1 tablet by mouth every 8 (eight) hours as  needed. 04/08/23  Yes [provider]  ranolazine (RANEXA) 1000 MG SR tablet Take 1 tablet (1,000 mg total) by mouth 2 (two) times daily. 01/10/23  Yes Corwin Levins, MD  cyclobenzaprine (FLEXERIL) 5 MG tablet Take 1 tablet (5 mg total) by mouth 3 (three) times daily as needed for muscle spasms. Patient not taking: Reported on 05/04/2023 04/07/23   Corwin Levins, MD  traMADol (ULTRAM) 50 MG tablet Take 1 tablet (50 mg total) by mouth every 6 (six) hours as needed. Patient not taking: Reported on 05/04/2023 04/07/23   Corwin Levins, MD      Allergies    Isosorbide    Review of Systems   Review of Systems  Constitutional:  Negative for chills and fever.  Musculoskeletal:  Positive for back pain. Negative for neck pain.  Neurological:  Negative for syncope.  All other systems reviewed and are negative.   Physical Exam Updated Vital Signs BP (!) 157/106   Pulse 66   Temp 98.3 F (36.8 C) (Oral)   Resp 10   Ht 5\' 10"  (1.778 m)   Wt 96.2 kg   SpO2 100%   BMI 30.42 kg/m  Physical Exam Vitals and nursing note reviewed.  Constitutional:      General: He is not in acute distress.  Appearance: Normal appearance. He is not ill-appearing.  HENT:     Head: Normocephalic and atraumatic.     Nose: Nose normal.  Eyes:     Conjunctiva/sclera: Conjunctivae normal.  Cardiovascular:     Rate and Rhythm: Normal rate and regular rhythm.  Pulmonary:     Effort: Pulmonary effort is normal. No respiratory distress.  Musculoskeletal:        General: No deformity. Normal range of motion.     Cervical back: Normal range of motion.  Skin:    Findings: No rash.  Neurological:     Mental Status: He is alert.     ED Results / Procedures / Treatments   Labs (all labs ordered are listed, but only abnormal results are displayed) Labs Reviewed  BASIC METABOLIC PANEL - Abnormal; Notable for the following components:      Result Value   Glucose, Bld 101 (*)    Creatinine, Ser 1.31 (*)     Calcium 10.6 (*)    All other components within normal limits  CBC WITH DIFFERENTIAL/PLATELET    EKG None  Radiology DG Knee Complete 4 Views Right Result Date: 05/04/2023 CLINICAL DATA:  Bilateral knee pain after multiple falls the last several weeks. EXAM: RIGHT KNEE - COMPLETE 4+ VIEW COMPARISON:  None Available. FINDINGS: No evidence of fracture, dislocation, or joint effusion. No evidence of arthropathy or other focal bone abnormality. Soft tissues are unremarkable. IMPRESSION: Negative. Electronically Signed   By: Lupita Raider M.D.   On: 05/04/2023 10:00   DG Knee Complete 4 Views Left Result Date: 05/04/2023 CLINICAL DATA:  Bilateral knee pain after multiple falls the last several weeks. EXAM: LEFT KNEE - COMPLETE 4+ VIEW COMPARISON:  None Available. FINDINGS: No evidence of fracture, dislocation, or joint effusion. No evidence of arthropathy or other focal bone abnormality. Soft tissues are unremarkable. IMPRESSION: Negative. Electronically Signed   By: Lupita Raider M.D.   On: 05/04/2023 09:59   CT Cervical Spine Wo Contrast Result Date: 05/04/2023 CLINICAL DATA:  58 year old male status post fall on stairs. EXAM: CT CERVICAL SPINE WITHOUT CONTRAST TECHNIQUE: Multidetector CT imaging of the cervical spine was performed without intravenous contrast. Multiplanar CT image reconstructions were also generated. RADIATION DOSE REDUCTION: This exam was performed according to the departmental dose-optimization program which includes automated exposure control, adjustment of the mA and/or kV according to patient size and/or use of iterative reconstruction technique. COMPARISON:  Head CT today. FINDINGS: Alignment: Straightening of cervical lordosis. Cervicothoracic junction alignment is within normal limits. Bilateral posterior element alignment is within normal limits. Skull base and vertebrae: Bone mineralization is within normal limits. Visualized skull base is intact. No atlanto-occipital  dissociation. C1 and C2 appear intact and aligned. No acute osseous abnormality identified. Soft tissues and spinal canal: No prevertebral fluid or swelling. No visible canal hematoma. Calcified cervical carotid atherosclerosis. Otherwise negative noncontrast neck soft tissues. Disc levels:  Widespread mild cervical disc bulging. Upper chest: Visible upper thoracic levels appear intact. Mild apical lung scarring. IMPRESSION: 1. No acute traumatic injury identified in the cervical spine. 2. Mild cervical spine degeneration, calcified cervical carotid atherosclerosis. Electronically Signed   By: Odessa Fleming M.D.   On: 05/04/2023 08:30   CT Lumbar Spine Wo Contrast Result Date: 05/04/2023 CLINICAL DATA:  58 year old male status post fall on stairs. EXAM: CT LUMBAR SPINE WITHOUT CONTRAST TECHNIQUE: Multidetector CT imaging of the lumbar spine was performed without intravenous contrast administration. Multiplanar CT image reconstructions were also generated. RADIATION DOSE  REDUCTION: This exam was performed according to the departmental dose-optimization program which includes automated exposure control, adjustment of the mA and/or kV according to patient size and/or use of iterative reconstruction technique. COMPARISON:  Lumbar MRI 07/15/2018 and CT Abdomen and Pelvis 05/24/2020. also CTA chest 06/18/2020. 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 Only 4 lumbarized vertebrae. L5 designated as fully sacralized. Correlation with radiographs is recommended prior to any operative intervention. Alignment: Straightening of lumbar lordosis is mildly increased from the 2022 CT. No scoliosis or spondylolisthesis. Vertebrae: Bone mineralization is within normal limits. Visible lower thoracic levels, lumbar vertebrae, visible sacrum and SI joints appear intact. Paraspinal and other soft tissues: Aortoiliac calcified atherosclerosis. Infrarenal abdominal aorta minimally visible on these images.  Punctate nephrolithiasis. Negative visible other noncontrast abdominal viscera. Negative lumbar paraspinal soft tissues. Disc levels: Mild for age lumbar spine degeneration above L4-L5. L4-L5: Chronic disc desiccation and disc space loss. Circumferential disc bulge with a broad-based posterior component. Moderate to severe facet degeneration, vacuum facet on the left. No spinal or convincing lateral recess stenosis. Chronic moderate to severe left L4 foraminal stenosis, mild right foraminal stenosis. L5-S1:  Sacralized. IMPRESSION: 1. No acute traumatic injury identified in the Lumbar Spine. 2. 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 Correlation with radiographs is recommended prior to any operative intervention. 3. Chronic L4-L5 disc degeneration, severe left side facet degeneration with vacuum facet and up to severe chronic left L4 foraminal stenosis. 4.  Aortic Atherosclerosis (ICD10-I70.0). Electronically Signed   By: Odessa Fleming M.D.   On: 05/04/2023 08:28   CT Head Wo Contrast Result Date: 05/04/2023 CLINICAL DATA:  58 year old male status post fall on stairs. EXAM: CT HEAD WITHOUT CONTRAST TECHNIQUE: Contiguous axial images were obtained from the base of the skull through the vertex without intravenous contrast. RADIATION DOSE REDUCTION: This exam was performed according to the departmental dose-optimization program which includes automated exposure control, adjustment of the mA and/or kV according to patient size and/or use of iterative reconstruction technique. COMPARISON:  None Available. FINDINGS: Brain: Cerebral volume is within normal limits for age. No midline shift, ventriculomegaly, mass effect, evidence of mass lesion, intracranial hemorrhage or evidence of cortically based acute infarction. Patchy, asymmetric, moderate for age bilateral cerebral white matter hypodensity which is mostly in the frontal lobes. Normal gray-white differentiation otherwise. Vascular: No suspicious  intracranial vascular hyperdensity. Skull: Calvarium appears intact, negative. Questionable small chronic right lamina papyracea fracture. Sinuses/Orbits: Small low-density fluid level or mucosal thickening in the left sphenoid sinus. Other visible sinuses, tympanic cavities and mastoids are well aerated. Other: Evidence of broad-based scalp hematoma at the posterior vertex on series 3, image 71. Incidental superimposed benign 2.5 cm scalp lipoma over the left occipital convexity series 3, image 32. IMPRESSION: 1. Possible vertex scalp hematoma. No skull fracture or acute intracranial abnormality. 2. Moderate for age cerebral white matter changes, most commonly due to small vessel disease. Electronically Signed   By: Odessa Fleming M.D.   On: 05/04/2023 08:22    Procedures Procedures    Medications Ordered in ED Medications  ketorolac (TORADOL) 15 MG/ML injection 15 mg (has no administration in time range)  lidocaine (LIDODERM) 5 % 1 patch (has no administration in time range)    ED Course/ Medical Decision Making/ A&P Clinical Course as of 05/04/23 1048  Wed May 04, 2023  [id number] 58 year old male with sounds like chronic knee and back issues here after fall downstairs in which his knee gave out on him.  He  is waiting to get into a pain clinic.  Appears neurologically intact.  Getting imaging.  Disposition per results of imaging. [MB]    Clinical Course User Index [MB] Terrilee Files, MD                                 Medical Decision Making Amount and/or Complexity of Data Reviewed Labs: ordered. Radiology: ordered.  Risk Prescription drug management.   Medical Decision Making / ED Course   This patient presents to the ED for concern of fall, back pain, this involves an extensive number of treatment options, and is a complaint that carries with it a high risk of complications and morbidity.  The differential diagnosis includes intracranial injury, muscle strain, lumbar spine fracture,  knee fracture knee sprain  MDM: 58 year old male presents today with fall that occurred this morning.  He is on Xarelto.  He believes he might of hit his head.  CT head, cervical spine without acute intracranial or cervical spinal pathology. Lumbar spine without acute finding.  Knee x-rays bilaterally without acute concerns.  On reevaluation patient is resting comfortably eating a sandwich and some peanut butter with crackers.  Patient is stable for discharge.  Will give dose of Toradol, Lidoderm in the emergency department.  Will prescribe Robaxin, Lidoderm patch.  He will follow-up with his PCP, as well as his pain management clinic.  He is stable for discharge.  Discharged in stable condition.  Return precautions discussed.    Lab Tests: -I ordered, reviewed, and interpreted labs.   The pertinent results include:   Labs Reviewed  BASIC METABOLIC PANEL - Abnormal; Notable for the following components:      Result Value   Glucose, Bld 101 (*)    Creatinine, Ser 1.31 (*)    Calcium 10.6 (*)    All other components within normal limits  CBC WITH DIFFERENTIAL/PLATELET      EKG  EKG Interpretation Date/Time:    Ventricular Rate:    PR Interval:    QRS Duration:    QT Interval:    QTC Calculation:   R Axis:      Text Interpretation:           Imaging Studies ordered: I ordered imaging studies including CT head, CT cervical spine, CT L-spine, right knee x-ray, left knee x-ray I independently visualized and interpreted imaging. I agree with the radiologist interpretation   Medicines ordered and prescription drug management: Meds ordered this encounter  Medications   ketorolac (TORADOL) 15 MG/ML injection 15 mg   lidocaine (LIDODERM) 5 % 1 patch   lidocaine (LIDODERM) 5 %    Sig: Place 1 patch onto the skin daily. Remove & Discard patch within 12 hours or as directed by MD    Dispense:  14 patch    Refill:  0    Supervising Provider:   Eber Hong [[id number]]    methocarbamol (ROBAXIN) 500 MG tablet    Sig: Take 1 tablet (500 mg total) by mouth 2 (two) times daily.    Dispense:  20 tablet    Refill:  0    Supervising Provider:   Eber Hong [[id number]]    -I have reviewed the patients home medicines and have made adjustments as needed  Social Determinants of Health:  Factors impacting patients care include: Establish outpatient care   Reevaluation: After the interventions noted above, I reevaluated the patient and found that they  have :improved  Co morbidities that complicate the patient evaluation  Past Medical History:  Diagnosis Date   Abnormal stress test    Anxiety    Asthma    as a child   Atypical chest pain 05/07/2017   Coronary artery disease    DDD (degenerative disc disease), lumbar 11/08/2019   Formatting of this note might be different from the original. 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  Return precaution discussed.  Patient voices understanding and agreement   Final Clinical Impression(s) / ED Diagnoses Final diagnoses:  Fall, initial encounter  Strain of lumbar region, initial encounter    Rx / DC Orders ED Discharge Orders          Ordered    lidocaine (LIDODERM) 5 %  Every 24 hours        05/04/23 1043    methocarbamol (ROBAXIN) 500 MG tablet  2 times daily        05/04/23 1043              Marita [place], [place] 05/04/23 1058    Terrilee Files, MD 05/04/23 1712

## 2023-05-04 NOTE — Discharge Instructions (Addendum)
 Your workup today was reassuring.  CT scan, x-rays were without concern.  You likely have a contusion or muscle strain to your low back.  I have sent in a lidocaine patch prescription along with muscle relaxer.  Your kidney function was slightly elevated.  This is around your baseline.  But because of this you should not be taking ibuprofen or other anti-inflammatory medications consistently.  Follow-up with your primary care provider.  Follow-up with your pain management clinic at the scheduled appointment.  Return for any concerning symptoms.

## 2023-05-04 NOTE — ED Provider Triage Note (Signed)
 Emergency Medicine Provider Triage Evaluation Note  Benjamin Espinoza , a 58 y.o. male  was evaluated in triage.  Pt complains of fall on the stairs.  He states he hit his back significantly hard and he might have also hit his head.  States he fell because his knees gave out.  He has history of chronic knee pain.  Review of Systems  Positive: As above Negative: As above  Physical Exam  BP (!) 123/108 (BP Location: Right Arm)   Pulse 96   Temp 98.3 F (36.8 C) (Oral)   Resp 20   Ht 5\' 10"  (1.778 m)   Wt 96.2 kg   SpO2 97%   BMI 30.42 kg/m  Gen:   Awake, no distress   Resp:  Normal effort  MSK:   Moves extremities without difficulty  Other:    Medical Decision Making  Medically screening exam initiated at 7:18 AM.  Appropriate orders placed.  ARISTOTELIS VILARDI was informed that the remainder of the evaluation will be completed by another provider, this initial triage assessment does not replace that evaluation, and the importance of remaining in the ED until their evaluation is complete.     Marita [place], PA-C 05/04/23 [phone]

## 2023-05-04 NOTE — ED Triage Notes (Signed)
 Pt arrived from home BIB [facility] for a fall, reports his "knees gave out" going down the stairs and pt fel on the steps, did not fall down the stairs, reports his back hit the stairs and having mid back pain. Denies hitting his head, but reports not totally sure, no LOC, pt is on thinners. A&O x4, NAD noted.   Has appt with pain management 3-12 for his chronic knee pain   EMS vitals 140/100 102 HR 97% RA

## 2023-05-06 DIAGNOSIS — Z5982 Transportation insecurity: Secondary | ICD-10-CM | POA: Diagnosis not present

## 2023-05-06 DIAGNOSIS — I252 Old myocardial infarction: Secondary | ICD-10-CM | POA: Diagnosis not present

## 2023-05-06 DIAGNOSIS — M4800 Spinal stenosis, site unspecified: Secondary | ICD-10-CM | POA: Diagnosis not present

## 2023-05-06 DIAGNOSIS — M1990 Unspecified osteoarthritis, unspecified site: Secondary | ICD-10-CM | POA: Diagnosis not present

## 2023-05-06 DIAGNOSIS — I10 Essential (primary) hypertension: Secondary | ICD-10-CM | POA: Diagnosis not present

## 2023-05-06 DIAGNOSIS — J449 Chronic obstructive pulmonary disease, unspecified: Secondary | ICD-10-CM | POA: Diagnosis not present

## 2023-05-06 DIAGNOSIS — E785 Hyperlipidemia, unspecified: Secondary | ICD-10-CM | POA: Diagnosis not present

## 2023-05-06 DIAGNOSIS — I2510 Atherosclerotic heart disease of native coronary artery without angina pectoris: Secondary | ICD-10-CM | POA: Diagnosis not present

## 2023-05-06 DIAGNOSIS — G629 Polyneuropathy, unspecified: Secondary | ICD-10-CM | POA: Diagnosis not present

## 2023-05-06 DIAGNOSIS — Z8249 Family history of ischemic heart disease and other diseases of the circulatory system: Secondary | ICD-10-CM | POA: Diagnosis not present

## 2023-05-06 DIAGNOSIS — I70209 Unspecified atherosclerosis of native arteries of extremities, unspecified extremity: Secondary | ICD-10-CM | POA: Diagnosis not present

## 2023-05-06 DIAGNOSIS — F17210 Nicotine dependence, cigarettes, uncomplicated: Secondary | ICD-10-CM | POA: Diagnosis not present

## 2023-05-09 ENCOUNTER — Other Ambulatory Visit: Payer: Self-pay | Admitting: Internal Medicine

## 2023-05-09 NOTE — Telephone Encounter (Addendum)
 Last Fill: 04/07/23  Last OV: 04/07/23 Next OV: 07/14/23  Routing to provider for review/authorization.

## 2023-05-09 NOTE — Telephone Encounter (Signed)
 Copied from CRM [id number]. Topic: Clinical - Medication Refill >> May 09, 2023  3:44 PM Florestine Avers wrote: Most Recent Primary Care Visit:  Provider: Corwin Levins  Department: [facility]  Visit Type: OFFICE VISIT  Date: 04/07/2023  Medication: traMADol (ULTRAM) 50 MG tablet  Has the patient contacted their pharmacy? Yes (Agent: If no, request that the patient contact the pharmacy for the refill. If patient does not wish to contact the pharmacy document the reason why and proceed with request.) (Agent: If yes, when and what did the pharmacy advise?)  Is this the correct pharmacy for this prescription? Yes If no, delete pharmacy and type the correct one.  This is the patient's preferred pharmacy:  [id number] - [place], [place] - [address] AT [facility] [address] [place] [address] Phone: [phone] Fax: [phone]   Has the prescription been filled recently? No  Is the patient out of the medication? Yes  Has the patient been seen for an appointment in the last year OR does the patient have an upcoming appointment? Yes  Can we respond through MyChart? Yes  Agent: Please be advised that Rx refills may take up to 3 business days. We ask that you follow-up with your pharmacy.

## 2023-05-11 DIAGNOSIS — I739 Peripheral vascular disease, unspecified: Secondary | ICD-10-CM | POA: Diagnosis not present

## 2023-05-11 DIAGNOSIS — M5416 Radiculopathy, lumbar region: Secondary | ICD-10-CM | POA: Diagnosis not present

## 2023-05-12 DIAGNOSIS — F17210 Nicotine dependence, cigarettes, uncomplicated: Secondary | ICD-10-CM | POA: Diagnosis not present

## 2023-05-12 DIAGNOSIS — N183 Chronic kidney disease, stage 3 unspecified: Secondary | ICD-10-CM | POA: Diagnosis not present

## 2023-05-12 DIAGNOSIS — G8929 Other chronic pain: Secondary | ICD-10-CM | POA: Diagnosis not present

## 2023-05-12 DIAGNOSIS — Z6827 Body mass index (BMI) 27.0-27.9, adult: Secondary | ICD-10-CM | POA: Diagnosis not present

## 2023-05-12 DIAGNOSIS — M25561 Pain in right knee: Secondary | ICD-10-CM | POA: Diagnosis not present

## 2023-05-12 DIAGNOSIS — I10 Essential (primary) hypertension: Secondary | ICD-10-CM | POA: Diagnosis not present

## 2023-05-12 DIAGNOSIS — M25562 Pain in left knee: Secondary | ICD-10-CM | POA: Diagnosis not present

## 2023-05-15 ENCOUNTER — Telehealth: Payer: Self-pay

## 2023-05-15 NOTE — Telephone Encounter (Signed)
 Patient called and wanted to visit with a Child psychotherapist or case Statistician. Discussed with him that the CM and CSW are here for patients that are in the Ed or hospital. He states he has called called and visited the [facility], and they said  he needs a referral?  He would also like some bus passes, he has heart conditions have afib and the electricity is going off tomorrow in his apartment , his roommate left  and he could pay the mortgage but not the electricity So he has to find someplace to stay. He cannot stay at his sisters, she is having her own problems to deal with. He does not get paid until next week from disability. Discussed [employer], Holiday representative , [employer]. At first he said he needed a new PCP with changed insurance, but then he confirmed that he is with [facility]. Told him to check with [facility] again tomorrow and speak to the Child psychotherapist. [facility] alert staff here on tomorrow in case he presents.

## 2023-05-23 NOTE — Congregational Nurse Program (Signed)
 Client to RN office for resources. He needs a PCP, in order to get a cardiologist and pain management clinic referral. The earliest available appointment is April 9th. He states he is staying in a boarding house that has mold and it is not a safe condition for him to be in. He needs new housing as soon as possible. We also worked on getting him a Hotel manager with [employer] [phone].   He needs help with food being delivered to him, help with electricity bill that will prevent him from having electricity whenever he attempts to get new housing. RN will follow up with him tomorrow at 1 pm to continue establishing services for this client.

## 2023-05-30 NOTE — Congregational Nurse Program (Signed)
 Client to RN office. He states the place he is living is unlivable from the mold and now new concerns of maggots in ovens and countertops of boarding house. RN is assisting client with a long term plan to get housing and food assistance. RN also helping getting him resources to get electric bill resolved as this is the biggest barrier for housing.  RN advised client strongly to reach out to [facility] for them to investigate and they may also assess and assist with moving expenses for client. RN reached out to the [employer] [facility] center and no response will follow up again later.  Called [facility] VM left. We were able to find several properties from [employer] to assist with housing solutions. He is going to check out properties and work on getting a lease. He needs to leave rooming house ASAP but has no housing solutions. RN will send a Kathreen [facility] request to assist client with housing to be finalized. Email sent and will follow up with client tomorrow.

## 2023-05-31 NOTE — Congregational Nurse Program (Signed)
 RN spoke to client via phone to setup hotel assistance. RN utilized [employer] resources to assist client with temporary housing for the next several weeks while he completes permanent housing placement. RN also supported client in reporting unsafe living conditions by encouraging client to follow up with [facility]. Additionally client encouraged to follow up with [employer] regarding leasing options and next steps in securing long term housing.

## 2023-06-06 NOTE — Congregational Nurse Program (Signed)
 Client to RN office for bus passes for medical appointment arranged by this RN, [facility] and [facility]. RN gave client bus passes as requested he also stated that he found a rooming house starting on May 1st and will get help from the [employer] to obtain furniture once he moves in. He is excited about these next steps. Will continue helping client with resources, emotional support and transportation assistance. RN will follow up with client as necessary.

## 2023-06-08 ENCOUNTER — Ambulatory Visit: Payer: Self-pay | Admitting: Nurse Practitioner

## 2023-06-08 ENCOUNTER — Encounter: Payer: Self-pay | Admitting: Nurse Practitioner

## 2023-06-08 VITALS — BP 127/97 | HR 89 | Temp 98.0°F | Wt 198.8 lb

## 2023-06-08 DIAGNOSIS — M25562 Pain in left knee: Secondary | ICD-10-CM | POA: Diagnosis not present

## 2023-06-08 DIAGNOSIS — I7090 Unspecified atherosclerosis: Secondary | ICD-10-CM

## 2023-06-08 DIAGNOSIS — G8929 Other chronic pain: Secondary | ICD-10-CM

## 2023-06-08 DIAGNOSIS — M25561 Pain in right knee: Secondary | ICD-10-CM

## 2023-06-08 DIAGNOSIS — G894 Chronic pain syndrome: Secondary | ICD-10-CM

## 2023-06-08 DIAGNOSIS — Z5900 Homelessness unspecified: Secondary | ICD-10-CM

## 2023-06-08 DIAGNOSIS — I10 Essential (primary) hypertension: Secondary | ICD-10-CM | POA: Diagnosis not present

## 2023-06-08 DIAGNOSIS — Z13220 Encounter for screening for lipoid disorders: Secondary | ICD-10-CM

## 2023-06-08 MED ORDER — CARVEDILOL 3.125 MG PO TABS: 3.1250 mg | ORAL_TABLET | Freq: Two times a day (BID) | ORAL | 11 refills | Status: DC

## 2023-06-08 MED ORDER — LOSARTAN POTASSIUM 50 MG PO TABS: 50.0000 mg | ORAL_TABLET | Freq: Every day | ORAL | 11 refills | Status: DC

## 2023-06-08 MED ORDER — ATORVASTATIN CALCIUM 80 MG PO TABS: 80.0000 mg | ORAL_TABLET | Freq: Every day | ORAL | 11 refills | Status: DC

## 2023-06-08 MED ORDER — TRAMADOL HCL 50 MG PO TABS: 50.0000 mg | ORAL_TABLET | Freq: Four times a day (QID) | ORAL | 0 refills | Status: DC | PRN

## 2023-06-08 MED ORDER — KETOROLAC TROMETHAMINE 60 MG/2ML IM SOLN
60.0000 mg | Freq: Once | INTRAMUSCULAR | Status: AC
Start: 2023-06-08 — End: 2023-06-08
  Administered 2023-06-08: 60 mg via INTRAMUSCULAR

## 2023-06-08 NOTE — Progress Notes (Signed)
 Subjective   Patient ID: Benjamin Espinoza, male    DOB: October 20, 1965, 58 y.o.   MRN: [id number]  Chief Complaint  Patient presents with   Knee Pain   Leg Pain    Patient stated that he is in a lot of pain     Referring provider: Corwin Levins, MD  Benjamin Espinoza is a 58 y.o. male with Past Medical History: No date: Abnormal stress test No date: Anxiety No date: Asthma     Comment:  as a child 05/07/2017: Atypical chest pain No date: Coronary artery disease 11/08/2019: DDD (degenerative disc disease), lumbar     Comment:  Formatting of this note might be different from the               original. [facility] No date: Essential hypertension x4 01/14/2016 [place]: Hx of CABG No date: Hyperlipidemia 2017: Myocardial infarction (HCC) 09/21/2019: PVD (peripheral vascular disease) (HCC) No date: Shortness of breath No date: Tobacco use disorder   HPI  Patient presents today to establish care.  He states that he has chronic low back pain, knee pain bilateral.  He did have recent x-rays that were clear.  He does request pain medication today.  We discussed that he did have a recent drug screen in the hospital that was positive for cocaine.  We will not be prescribing controlled substances to him.  He is currently homeless.  We will place a referral for social work to help him with this.  We did give him food from the food pantry today. Will place referral for pain management. Also placed a referral to cardiology. Denies f/c/s, n/v/d, hemoptysis, PND, leg swelling Denies chest pain or edema      Allergies  Allergen Reactions   Isosorbide     Other Reaction(s): Other (See Comments)  Severe headache    Immunization History  Administered Date(s) Administered   Unspecified SARS-COV-2 Vaccination 04/02/2019    Tobacco History: Social History   Tobacco Use  Smoking Status Every Day   Types: Cigars, Cigarettes   Passive exposure: Current (Wife is a smoker)   Smokeless Tobacco Never  Tobacco Comments   as of 12/20/19 2 cigararettes/day   Ready to quit: No Counseling given: Yes Tobacco comments: as of 12/20/19 2 cigararettes/day   Outpatient Encounter Medications as of 06/08/2023  Medication Sig   aspirin EC 81 MG tablet Take 1 tablet (81 mg total) by mouth daily.   cilostazol (PLETAL) 100 MG tablet Take 1 tablet (100 mg total) by mouth 2 (two) times daily.   gabapentin (NEURONTIN) 300 MG capsule Take 300 mg by mouth 3 (three) times daily.   lidocaine (LIDODERM) 5 % Place 1 patch onto the skin daily. Remove & Discard patch within 12 hours or as directed by MD   [DISCONTINUED] atorvastatin (LIPITOR) 80 MG tablet Take 1 tablet (80 mg total) by mouth daily.   [DISCONTINUED] carvedilol (COREG) 3.125 MG tablet Take 1 tablet (3.125 mg total) by mouth 2 (two) times daily with a meal.   [DISCONTINUED] losartan (COZAAR) 50 MG tablet Take 1 tablet (50 mg total) by mouth daily.   atorvastatin (LIPITOR) 80 MG tablet Take 1 tablet (80 mg total) by mouth daily.   carvedilol (COREG) 3.125 MG tablet Take 1 tablet (3.125 mg total) by mouth 2 (two) times daily with a meal.   cyclobenzaprine (FLEXERIL) 5 MG tablet Take 1 tablet (5 mg total) by mouth 3 (three) times daily as needed for muscle  spasms. (Patient not taking: Reported on 05/04/2023)   losartan (COZAAR) 50 MG tablet Take 1 tablet (50 mg total) by mouth daily.   methocarbamol (ROBAXIN) 500 MG tablet Take 1 tablet (500 mg total) by mouth 2 (two) times daily. (Patient not taking: Reported on 06/08/2023)   oxyCODONE-acetaminophen (PERCOCET) 7.5-325 MG tablet Take 1 tablet by mouth every 8 (eight) hours as needed. (Patient not taking: Reported on 06/08/2023)   ranolazine (RANEXA) 1000 MG SR tablet Take 1 tablet (1,000 mg total) by mouth 2 (two) times daily. (Patient not taking: Reported on 06/08/2023)   [DISCONTINUED] traMADol (ULTRAM) 50 MG tablet Take 1 tablet (50 mg total) by mouth every 6 (six) hours as needed.  (Patient not taking: Reported on 05/04/2023)   [DISCONTINUED] traMADol (ULTRAM) 50 MG tablet Take 1 tablet (50 mg total) by mouth every 6 (six) hours as needed for up to 7 days.   [EXPIRED] ketorolac (TORADOL) injection 60 mg    No facility-administered encounter medications on file as of 06/08/2023.    Review of Systems  Review of Systems  Constitutional: Negative.   HENT: Negative.    Cardiovascular: Negative.   Gastrointestinal: Negative.   Allergic/Immunologic: Negative.   Neurological: Negative.   Psychiatric/Behavioral: Negative.       Objective:   BP (!) 127/97   Pulse 89   Temp 98 F (36.7 C) (Oral)   Wt 198 lb 12.8 oz (90.2 kg)   SpO2 100%   BMI 28.52 kg/m   Wt Readings from Last 5 Encounters:  06/08/23 198 lb 12.8 oz (90.2 kg)  05/04/23 212 lb (96.2 kg)  03/25/23 208 lb (94.3 kg)  03/23/23 212 lb (96.2 kg)  01/10/23 205 lb (93 kg)     Physical Exam Vitals and nursing note reviewed.  Constitutional:      General: He is not in acute distress.    Appearance: He is well-developed.  Cardiovascular:     Rate and Rhythm: Normal rate and regular rhythm.  Pulmonary:     Effort: Pulmonary effort is normal.     Breath sounds: Normal breath sounds.  Musculoskeletal:     Right knee: No swelling. Tenderness present.     Left knee: No swelling. Tenderness present.  Skin:    General: Skin is warm and dry.  Neurological:     Mental Status: He is alert and oriented to person, place, and time.       Assessment & Plan:   Chronic pain syndrome -     Ambulatory referral to Physical Medicine Rehab -     Ketorolac Tromethamine  Essential hypertension -     Ambulatory referral to Cardiology  Atherosclerosis -     Ambulatory referral to Cardiology  Chronic pain of both knees -     Ambulatory referral to Orthopedics -     Ketorolac Tromethamine  Lipid screening  Homeless -     AMB Referral VBCI Care Management  Other orders -     Atorvastatin Calcium;  Take 1 tablet (80 mg total) by mouth daily.  Dispense: 30 tablet; Refill: 11 -     Carvedilol; Take 1 tablet (3.125 mg total) by mouth 2 (two) times daily with a meal.  Dispense: 60 tablet; Refill: 11 -     Losartan Potassium; Take 1 tablet (50 mg total) by mouth daily.  Dispense: 30 tablet; Refill: 11     Return in about 3 months (around 09/07/2023).   Ivonne Andrew, NP 06/08/2023

## 2023-06-08 NOTE — Patient Instructions (Signed)
 1. Chronic pain syndrome (Primary)  - Ambulatory referral to Physical Medicine Rehab - ToxAssure Flex 15, Ur - traMADol (ULTRAM) 50 MG tablet; Take 1 tablet (50 mg total) by mouth every 6 (six) hours as needed for up to 7 days.  Dispense: 28 tablet; Refill: 0  2. Essential hypertension  - Ambulatory referral to Cardiology  3. Atherosclerosis  - Ambulatory referral to Cardiology  4. Chronic pain of both knees  - AMB referral to orthopedics - traMADol (ULTRAM) 50 MG tablet; Take 1 tablet (50 mg total) by mouth every 6 (six) hours as needed for up to 7 days.  Dispense: 28 tablet; Refill: 0  5. Lipid screening

## 2023-06-09 ENCOUNTER — Telehealth: Payer: Self-pay

## 2023-06-09 ENCOUNTER — Telehealth: Payer: Self-pay | Admitting: *Deleted

## 2023-06-09 ENCOUNTER — Other Ambulatory Visit: Payer: Self-pay | Admitting: *Deleted

## 2023-06-09 ENCOUNTER — Other Ambulatory Visit: Payer: Self-pay

## 2023-06-09 NOTE — Progress Notes (Signed)
   Telephone encounter was:  Successful.  Complex Care Management Note Care Guide Note  06/09/2023 Name: [name] MRN: [id number] DOB: Oct 12, 1965  Benjamin Espinoza is a 58 y.o. year old male who is a primary care patient of Ivonne Andrew, NP . The community resource team was consulted for assistance with Transportation Needs , Food Insecurity, and Housing   SDOH screenings and interventions completed:  Yes  Social Drivers of Health From This Encounter   Food Insecurity: Food Insecurity Present (06/09/2023)   Hunger Vital Sign    Worried About Running Out of Food in the Last Year: Often true    Ran Out of Food in the Last Year: Often true  Housing: High Risk (06/09/2023)   Housing Stability Vital Sign    Unable to Pay for Housing in the Last Year: Yes    Homeless in the Last Year: Yes  Financial Resource Strain: High Risk (06/09/2023)   Overall Financial Resource Strain (CARDIA)    Difficulty of Paying Living Expenses: Very hard  Transportation Needs: Unmet Transportation Needs (06/09/2023)   PRAPARE - Administrator, Civil Service (Medical): Yes    Lack of Transportation (Non-Medical): Yes    SDOH Interventions Today    Flowsheet Row Most Recent Value  SDOH Interventions   Food Insecurity Interventions [employer] Referral, Programmer, applications Provided  Housing Interventions Community Resources Provided, [employer] Referral  Transportation Interventions Community Resources Provided, [id number] Referral  Utilities Interventions Community Resources Provided, [id number] Referral  Financial Strain Interventions Community Resources Provided, [facility] Referral        Care guide performed the following interventions: Patient provided with information about care guide support team and interviewed to confirm resource needs. Pt is homeless and having financial strain due to current health concerns and in need of housing and food resources for [facility], Pt is  now with [facility] CASE MANAGER he will also need LCSW. Pt requested I email resources and add referrals to [facility]   Follow Up Plan:  Care guide will follow up with patient by phone over the next week  Encounter Outcome:  Patient Visit Completed    Lenard Forth [facility]  [facility] Guide, Phone: [phone] Fax: [phone] Website: [email or web address]

## 2023-06-09 NOTE — Progress Notes (Signed)
 Complex Care Management Note  Care Guide Note 06/09/2023 Name: Benjamin Espinoza MRN: [id number] DOB: [birth year]  Benjamin Espinoza is a 58 y.o. year old male who sees Ivonne Andrew, NP for primary care. I reached out to [name] by phone today to offer complex care management services.  Mr. Sossamon was given information about Complex Care Management services today including:   The Complex Care Management services include support from the care team which includes your Nurse Care Manager, Clinical Social Worker, or Pharmacist.  The Complex Care Management team is here to help remove barriers to the health concerns and goals most important to you. Complex Care Management services are voluntary, and the patient may decline or stop services at any time by request to their care team member.   Complex Care Management Consent Status: Patient agreed to services and verbal consent obtained.   Follow up plan:  Telephone appointment with complex care management team member scheduled for:  06/09/23  Encounter Outcome:  Patient Scheduled  Gwenevere Ghazi  [facility]  [facility], [facility] Guide  Direct Dial: [phone]  Fax [phone]

## 2023-06-10 NOTE — Patient Instructions (Signed)
 Visit Information  Thank you for taking time to visit with me today. Please don't hesitate to contact me if I can be of assistance to you before our next scheduled appointment.  Our next appointment is by telephone on 429/25 at 2pm Please call the care guide team at [phone] if you need to cancel or reschedule your appointment.   Following is a copy of your care plan:   Goals Addressed             This Visit's Progress    VBCI Social Work Care Plan       Problems:   Housing   CSW Clinical Goal(s):   Over the next 90 days the Patient will explore community resource options for unmet needs related to Housing .  Interventions:  Social Determinants of Health in Patient with  chronic pain : SDOH assessments completed: Depression  , Food Insecurity , Housing , Intimate Partner Violence, and Transportation Evaluation of current treatment plan related to unmet needs Housing resources: Shelters in neighboring cities as well as Assisted Living options  Patient Goals/Self-Care Activities:  Continue to coordinate with the [employer] to assist with housing options Consider the [employer], [employer], and the [employer] as possible housing options as well.   Plan:   Telephone follow up appointment with care management team member scheduled for:  06/28/23        Please call 911 if you are experiencing a Mental Health or Behavioral Health Crisis or need someone to talk to.  Patient verbalizes understanding of instructions and care plan provided today and agrees to view in MyChart. Active MyChart status and patient understanding of how to access instructions and care plan via MyChart confirmed with patient.     Fronia Depass, LCSW [place]  [facility], [facility] Licensed Clinical Social Worker Care Coordinator  Direct Dial: [phone]

## 2023-06-10 NOTE — Patient Outreach (Addendum)
 Complex Care Management   Visit Note  08/12/2023 late entry  Name:  Benjamin Espinoza MRN: [id number] DOB: Nov 28, 1965  Situation: Referral received for Complex Care Management related to SDOH Barriers:  Housing Patient currently residing in a hotel-costs covered by the [employer], however only until 06/10/23. Patietn requesting assistance with housing options. I obtained verbal consent from patient.  Visit completed with patient  on the phone  Background:   Past Medical History:  Diagnosis Date   Abnormal stress test    Acute left ankle pain 03/23/2023   Anxiety    Asthma    as a child   Atypical chest pain 05/07/2017   Chest pain  Unstable angina 06/15/2023   Chronic health problem 06/15/2023   Chronic pain syndrome 03/29/2023   Cocaine abuse (HCC) 06/16/2023   Coronary artery disease    DDD (degenerative disc disease), lumbar 11/08/2019   Formatting of this note might be different from the original. 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 There is mild dilatation of the ascending aorta, measuring 43 mm. There is mild dilatation of the aortic root, measuring 44 mm.      Encounter for medication monitoring 01/10/2023   Encounter for well adult exam with abnormal findings 04/08/2023   Essential hypertension    Heart failure with mid-range ejection fraction (HFmEF) (HCC) 06/17/2023   06/16/23 transthoracic echocardiogram: Left Ventricle: Left ventricular ejection fraction 45 to 50%. The left ventricle has mildly decreased function. No left ventricular hypertrophy. 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 [place]    Hyperlipidemia    Impaired mobility and ADLs 03/23/2023   Ischemic cardiomyopathy 12/17/2020   Lumbar radiculopathy 07/05/2023   Myocardial infarction [facility]) 2017   NSTEMI (non-ST elevated myocardial infarction) (HCC) 11/28/2020   OAB  (overactive bladder) 01/11/2023   PAD (peripheral artery disease) (HCC) 09/21/2022   PVD (peripheral vascular disease) (HCC) 09/21/2019   Right leg pain 03/23/2023   Shortness of breath    Sinus bradycardia 11/29/2019   Tobacco use disorder    Unhoused person 06/16/2023   Unstable angina (HCC) 06/15/2023    Assessment: Patient Reported Symptoms:  Cognitive Alert and oriented to person, place, and time, Insightful and able to interpret abstract concepts  Neurological Not assessed    HEENT Not assessed    Cardiovascular Dizziness, Fainting (when walking over 20 feet)    Respiratory Not assesed    Endocrine No symptoms reported    Gastrointestinal Not assessed    Genitourinary No symptoms reported    Integumentary No symptoms reported    Musculoskeletal Not assessed    Psychosocial No symptoms reported patient currenlty homeless-requesting assistance with available housing options   There were no vitals filed for this visit.  Medications Reviewed Today   Medications were not reviewed in this encounter     Recommendation:   Patient to continue to work with CSW and the [employer] regarding housing options  Follow Up Plan:   Telephone follow-up 06/28/23  Michaelle Adolphus, LCSW [place]  [facility], [facility] Licensed Clinical Social Worker Care Coordinator  Direct Dial: [phone]

## 2023-06-15 ENCOUNTER — Encounter (HOSPITAL_COMMUNITY): Payer: Self-pay

## 2023-06-15 ENCOUNTER — Telehealth: Payer: Self-pay

## 2023-06-15 ENCOUNTER — Other Ambulatory Visit: Payer: Self-pay

## 2023-06-15 ENCOUNTER — Emergency Department (HOSPITAL_COMMUNITY)

## 2023-06-15 ENCOUNTER — Inpatient Hospital Stay (HOSPITAL_COMMUNITY)
Admission: EM | Admit: 2023-06-15 | Discharge: 2023-06-17 | DRG: 287 | Discharge status: Against Medical Advice | Attending: Family Medicine | Admitting: Family Medicine

## 2023-06-15 ENCOUNTER — Ambulatory Visit: Payer: Self-pay

## 2023-06-15 DIAGNOSIS — E782 Mixed hyperlipidemia: Secondary | ICD-10-CM | POA: Diagnosis not present

## 2023-06-15 DIAGNOSIS — Z7902 Long term (current) use of antithrombotics/antiplatelets: Secondary | ICD-10-CM | POA: Diagnosis not present

## 2023-06-15 DIAGNOSIS — I25110 Atherosclerotic heart disease of native coronary artery with unstable angina pectoris: Secondary | ICD-10-CM | POA: Diagnosis not present

## 2023-06-15 DIAGNOSIS — I200 Unstable angina: Secondary | ICD-10-CM | POA: Diagnosis not present

## 2023-06-15 DIAGNOSIS — Z5900 Homelessness unspecified: Secondary | ICD-10-CM | POA: Diagnosis present

## 2023-06-15 DIAGNOSIS — J45909 Unspecified asthma, uncomplicated: Secondary | ICD-10-CM | POA: Diagnosis not present

## 2023-06-15 DIAGNOSIS — Z79899 Other long term (current) drug therapy: Secondary | ICD-10-CM | POA: Diagnosis not present

## 2023-06-15 DIAGNOSIS — R7303 Prediabetes: Secondary | ICD-10-CM | POA: Diagnosis present

## 2023-06-15 DIAGNOSIS — Z789 Other specified health status: Secondary | ICD-10-CM

## 2023-06-15 DIAGNOSIS — Z955 Presence of coronary angioplasty implant and graft: Secondary | ICD-10-CM

## 2023-06-15 DIAGNOSIS — Z91199 Patient's noncompliance with other medical treatment and regimen due to unspecified reason: Secondary | ICD-10-CM | POA: Diagnosis not present

## 2023-06-15 DIAGNOSIS — I739 Peripheral vascular disease, unspecified: Secondary | ICD-10-CM

## 2023-06-15 DIAGNOSIS — E785 Hyperlipidemia, unspecified: Secondary | ICD-10-CM | POA: Diagnosis not present

## 2023-06-15 DIAGNOSIS — F1410 Cocaine abuse, uncomplicated: Secondary | ICD-10-CM | POA: Diagnosis not present

## 2023-06-15 DIAGNOSIS — Z5329 Procedure and treatment not carried out because of patient's decision for other reasons: Secondary | ICD-10-CM | POA: Diagnosis not present

## 2023-06-15 DIAGNOSIS — I25700 Atherosclerosis of coronary artery bypass graft(s), unspecified, with unstable angina pectoris: Secondary | ICD-10-CM | POA: Diagnosis not present

## 2023-06-15 DIAGNOSIS — I119 Hypertensive heart disease without heart failure: Secondary | ICD-10-CM | POA: Diagnosis present

## 2023-06-15 DIAGNOSIS — I10 Essential (primary) hypertension: Secondary | ICD-10-CM | POA: Diagnosis not present

## 2023-06-15 DIAGNOSIS — R079 Chest pain, unspecified: Secondary | ICD-10-CM | POA: Diagnosis not present

## 2023-06-15 DIAGNOSIS — F419 Anxiety disorder, unspecified: Secondary | ICD-10-CM | POA: Diagnosis not present

## 2023-06-15 DIAGNOSIS — I2584 Coronary atherosclerosis due to calcified coronary lesion: Secondary | ICD-10-CM | POA: Diagnosis not present

## 2023-06-15 DIAGNOSIS — M51369 Other intervertebral disc degeneration, lumbar region without mention of lumbar back pain or lower extremity pain: Secondary | ICD-10-CM | POA: Diagnosis present

## 2023-06-15 DIAGNOSIS — I252 Old myocardial infarction: Secondary | ICD-10-CM

## 2023-06-15 DIAGNOSIS — I5022 Chronic systolic (congestive) heart failure: Secondary | ICD-10-CM | POA: Diagnosis present

## 2023-06-15 DIAGNOSIS — Z8249 Family history of ischemic heart disease and other diseases of the circulatory system: Secondary | ICD-10-CM | POA: Diagnosis not present

## 2023-06-15 DIAGNOSIS — Z888 Allergy status to other drugs, medicaments and biological substances status: Secondary | ICD-10-CM

## 2023-06-15 DIAGNOSIS — R0602 Shortness of breath: Secondary | ICD-10-CM | POA: Diagnosis not present

## 2023-06-15 DIAGNOSIS — Z7901 Long term (current) use of anticoagulants: Secondary | ICD-10-CM

## 2023-06-15 DIAGNOSIS — F1910 Other psychoactive substance abuse, uncomplicated: Secondary | ICD-10-CM | POA: Diagnosis present

## 2023-06-15 DIAGNOSIS — Z7982 Long term (current) use of aspirin: Secondary | ICD-10-CM | POA: Diagnosis not present

## 2023-06-15 DIAGNOSIS — I700 Atherosclerosis of aorta: Secondary | ICD-10-CM | POA: Diagnosis present

## 2023-06-15 DIAGNOSIS — F17210 Nicotine dependence, cigarettes, uncomplicated: Secondary | ICD-10-CM | POA: Diagnosis present

## 2023-06-15 DIAGNOSIS — I77810 Thoracic aortic ectasia: Secondary | ICD-10-CM | POA: Diagnosis present

## 2023-06-15 DIAGNOSIS — I7121 Aneurysm of the ascending aorta, without rupture: Secondary | ICD-10-CM | POA: Diagnosis not present

## 2023-06-15 HISTORY — DX: Unstable angina: I20.0

## 2023-06-15 HISTORY — DX: Other specified health status: Z78.9

## 2023-06-15 HISTORY — DX: Chest pain, unspecified: R07.9

## 2023-06-15 LAB — BASIC METABOLIC PANEL WITH GFR
Anion gap: 8 (ref 5–15)
BUN: 21 mg/dL — ABNORMAL HIGH (ref 6–20)
CO2: 22 mmol/L (ref 22–32)
Calcium: 9.8 mg/dL (ref 8.9–10.3)
Chloride: 105 mmol/L (ref 98–111)
Creatinine, Ser: 1.57 mg/dL — ABNORMAL HIGH (ref 0.61–1.24)
GFR, Estimated: 51 mL/min — ABNORMAL LOW (ref >60)
Glucose, Bld: 90 mg/dL (ref 70–99)
Potassium: 3.9 mmol/L (ref 3.5–5.1)
Sodium: 135 mmol/L (ref 135–145)

## 2023-06-15 LAB — CBC
HCT: 46.1 % (ref 39.0–52.0)
Hemoglobin: 15.3 g/dL (ref 13.0–17.0)
MCH: 31.3 pg (ref 26.0–34.0)
MCHC: 33.2 g/dL (ref 30.0–36.0)
MCV: 94.3 fL (ref 80.0–100.0)
Platelets: 265 10*3/uL (ref 150–400)
RBC: 4.89 MIL/uL (ref 4.22–5.81)
RDW: 13.2 % (ref 11.5–15.5)
WBC: 6.9 10*3/uL (ref 4.0–10.5)
nRBC: 0 % (ref 0.0–0.2)

## 2023-06-15 LAB — RAPID URINE DRUG SCREEN, HOSP PERFORMED
Amphetamines: NOT DETECTED
Barbiturates: NOT DETECTED
Benzodiazepines: NOT DETECTED
Cocaine: POSITIVE — AB
Opiates: NOT DETECTED
Tetrahydrocannabinol: POSITIVE — AB

## 2023-06-15 LAB — TROPONIN I (HIGH SENSITIVITY)
Troponin I (High Sensitivity): 30 ng/L — ABNORMAL HIGH (ref <18)
Troponin I (High Sensitivity): 30 ng/L — ABNORMAL HIGH (ref <18)

## 2023-06-15 MED ORDER — ACETAMINOPHEN 650 MG RE SUPP: 650.0000 mg | Freq: Four times a day (QID) | RECTAL | Status: DC | PRN

## 2023-06-15 MED ORDER — RANOLAZINE ER 500 MG PO TB12
1000.0000 mg | ORAL_TABLET | Freq: Two times a day (BID) | ORAL | Status: DC
  Administered 2023-06-16 – 2023-06-17 (×3): 1000 mg via ORAL
  Filled 2023-06-15 (×3): qty 2

## 2023-06-15 MED ORDER — ASPIRIN 325 MG PO TBEC
325.0000 mg | DELAYED_RELEASE_TABLET | Freq: Every day | ORAL | Status: DC
  Administered 2023-06-15: 325 mg via ORAL
  Filled 2023-06-15: qty 1

## 2023-06-15 MED ORDER — HEPARIN (PORCINE) 25000 UT/250ML-% IV SOLN
1100.0000 [IU]/h | INTRAVENOUS | Status: DC
Start: 2023-06-15 — End: 2023-06-16
  Administered 2023-06-15: 1100 [IU]/h via INTRAVENOUS
  Filled 2023-06-15: qty 250

## 2023-06-15 MED ORDER — HEPARIN BOLUS VIA INFUSION
4000.0000 [IU] | Freq: Once | INTRAVENOUS | Status: AC
  Administered 2023-06-15: 4000 [IU] via INTRAVENOUS
  Filled 2023-06-15: qty 4000

## 2023-06-15 MED ORDER — LIDOCAINE 5 % EX PTCH
1.0000 | MEDICATED_PATCH | Freq: Every day | CUTANEOUS | Status: DC
  Administered 2023-06-15 – 2023-06-16 (×2): 1 via TRANSDERMAL
  Filled 2023-06-15 (×2): qty 1

## 2023-06-15 MED ORDER — ASPIRIN 325 MG PO TBEC
325.0000 mg | DELAYED_RELEASE_TABLET | Freq: Once | ORAL | Status: DC
Start: 2023-06-15 — End: 2023-06-15

## 2023-06-15 MED ORDER — POLYETHYLENE GLYCOL 3350 17 G PO PACK: 17.0000 g | PACK | Freq: Every day | ORAL | Status: DC | PRN

## 2023-06-15 MED ORDER — IOHEXOL 350 MG/ML SOLN
75.0000 mL | Freq: Once | INTRAVENOUS | Status: AC | PRN
  Administered 2023-06-15: 75 mL via INTRAVENOUS

## 2023-06-15 MED ORDER — ONDANSETRON HCL 4 MG/2ML IJ SOLN
4.0000 mg | Freq: Once | INTRAMUSCULAR | Status: AC
  Administered 2023-06-15: 4 mg via INTRAVENOUS
  Filled 2023-06-15: qty 2

## 2023-06-15 MED ORDER — ASPIRIN 81 MG PO TBEC
81.0000 mg | DELAYED_RELEASE_TABLET | Freq: Every day | ORAL | Status: DC
Start: 2023-06-16 — End: 2023-06-17
  Administered 2023-06-16 – 2023-06-17 (×2): 81 mg via ORAL
  Filled 2023-06-15 (×2): qty 1

## 2023-06-15 MED ORDER — ACETAMINOPHEN 325 MG PO TABS
650.0000 mg | ORAL_TABLET | Freq: Four times a day (QID) | ORAL | Status: DC | PRN
  Administered 2023-06-16: 650 mg via ORAL
  Filled 2023-06-15: qty 2

## 2023-06-15 MED ORDER — NITROGLYCERIN IN D5W 200-5 MCG/ML-% IV SOLN
0.0000 ug/min | INTRAVENOUS | Status: DC
  Administered 2023-06-15: 5 ug/min via INTRAVENOUS
  Filled 2023-06-15: qty 250

## 2023-06-15 MED ORDER — NITROGLYCERIN 0.4 MG SL SUBL
0.4000 mg | SUBLINGUAL_TABLET | SUBLINGUAL | Status: DC | PRN
  Administered 2023-06-15: 0.4 mg via SUBLINGUAL
  Filled 2023-06-15: qty 1

## 2023-06-15 MED ORDER — CARVEDILOL 3.125 MG PO TABS
3.1250 mg | ORAL_TABLET | Freq: Two times a day (BID) | ORAL | Status: DC
  Administered 2023-06-16 – 2023-06-17 (×2): 3.125 mg via ORAL
  Filled 2023-06-15 (×2): qty 1

## 2023-06-15 MED ORDER — CILOSTAZOL 100 MG PO TABS
100.0000 mg | ORAL_TABLET | Freq: Two times a day (BID) | ORAL | Status: DC
  Administered 2023-06-15 – 2023-06-16 (×2): 100 mg via ORAL
  Filled 2023-06-15 (×2): qty 1

## 2023-06-15 MED ORDER — MORPHINE SULFATE (PF) 4 MG/ML IV SOLN
4.0000 mg | Freq: Once | INTRAVENOUS | Status: AC
  Administered 2023-06-15: 4 mg via INTRAVENOUS
  Filled 2023-06-15: qty 1

## 2023-06-15 MED ORDER — ATORVASTATIN CALCIUM 80 MG PO TABS
80.0000 mg | ORAL_TABLET | Freq: Every day | ORAL | Status: DC
Start: 2023-06-16 — End: 2023-06-17
  Administered 2023-06-16 – 2023-06-17 (×2): 80 mg via ORAL
  Filled 2023-06-15: qty 1
  Filled 2023-06-15: qty 2

## 2023-06-15 MED ORDER — GABAPENTIN 300 MG PO CAPS
300.0000 mg | ORAL_CAPSULE | Freq: Three times a day (TID) | ORAL | Status: DC
  Administered 2023-06-15 (×2): 300 mg via ORAL
  Filled 2023-06-15: qty 1

## 2023-06-15 MED ORDER — LOSARTAN POTASSIUM 50 MG PO TABS
50.0000 mg | ORAL_TABLET | Freq: Every day | ORAL | Status: DC
  Administered 2023-06-16: 50 mg via ORAL
  Filled 2023-06-15: qty 1

## 2023-06-15 NOTE — Telephone Encounter (Signed)
 Spoke to pt and advise to go to the hospital . Pt advised that he did nit have transportation. He was advised to call 911 for an ambulance. KH

## 2023-06-15 NOTE — Telephone Encounter (Unsigned)
 Copied from CRM [id number]. Topic: General - Call Back - No Documentation >> Jun 15, 2023 10:17 AM Shereese L wrote: Reason for CRM: patient is waiting for a SL2 form to be filled out from the office for assisted living

## 2023-06-15 NOTE — ED Provider Notes (Signed)
 [facility] AT [facility] Provider Note   CSN: [id number] Arrival date & time: 06/15/23  1454     History  Chief Complaint  Patient presents with   Chest Pain    Benjamin Espinoza is a 58 y.o. male with PMH as listed below who presents with chest pain and shortness of breath.  Patient reports that he has not taken his medications in the past couple days.  Has been without his Xarelto for 3 days.  Has been having chest pain, neck pain and numbness down the right arm that began today.  Has been feeling shortness of breath as well.  Worse with exertion. Doesn't have chest pain daily. Feels like his pain did when he had his CABG. Endorses nausea no vomiting, endorses diaphoresis.  H/o CAD w/ CABG 2017, stent x1 2021 c/b stent occlusion 2022 recommended medical management. Also has significant h/o PVD s/p R fem-pop bypass. Currently homeless.    Past Medical History:  Diagnosis Date   Abnormal stress test    Anxiety    Asthma    as a child   Atypical chest pain 05/07/2017   Coronary artery disease    DDD (degenerative disc disease), lumbar 11/08/2019   Formatting of this note might be different from the original. 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 [place]   Hyperlipidemia    Myocardial infarction [facility]) 2017   PVD (peripheral vascular disease) (HCC) 09/21/2019   Shortness of breath    Tobacco use disorder        Home Medications Prior to Admission medications   Medication Sig Start Date End Date Taking? Authorizing Provider  aspirin EC 81 MG tablet Take 1 tablet (81 mg total) by mouth daily. 09/23/22  Yes Rhyne, Ames Coupe, PA-C  atorvastatin (LIPITOR) 80 MG tablet Take 1 tablet (80 mg total) by mouth daily. 06/08/23  Yes Ivonne Andrew, NP  carvedilol (COREG) 3.125 MG tablet Take 1 tablet (3.125 mg total) by mouth 2 (two) times daily with a meal. 06/08/23  Yes Ivonne Andrew, NP  cilostazol (PLETAL) 100 MG tablet  Take 1 tablet (100 mg total) by mouth 2 (two) times daily. 04/07/23  Yes Corwin Levins, MD  cyclobenzaprine (FLEXERIL) 5 MG tablet Take 1 tablet (5 mg total) by mouth 3 (three) times daily as needed for muscle spasms. 04/07/23  Yes Corwin Levins, MD  gabapentin (NEURONTIN) 300 MG capsule Take 300 mg by mouth 3 (three) times daily. 04/25/23  Yes [provider]  lidocaine (LIDODERM) 5 % Place 1 patch onto the skin daily. Remove & Discard patch within 12 hours or as directed by MD 05/04/23  Yes Karie Mainland, Amjad, PA-C  losartan (COZAAR) 50 MG tablet Take 1 tablet (50 mg total) by mouth daily. 06/08/23  Yes Ivonne Andrew, NP  ranolazine (RANEXA) 1000 MG SR tablet Take 1 tablet (1,000 mg total) by mouth 2 (two) times daily. 01/10/23  Yes Corwin Levins, MD  solifenacin (VESICARE) 5 MG tablet Take 5 mg by mouth daily. 06/02/23  Yes [provider]  methocarbamol (ROBAXIN) 500 MG tablet Take 1 tablet (500 mg total) by mouth 2 (two) times daily. Patient not taking: Reported on 06/08/2023 05/04/23   Marita Kansas, PA-C  oxyCODONE-acetaminophen (PERCOCET) 7.5-325 MG tablet Take 1 tablet by mouth every 8 (eight) hours as needed. Patient not taking: Reported on 06/08/2023 04/08/23   [provider]  Allergies    Isosorbide    Review of Systems   Review of Systems A 10 point review of systems was performed and is negative unless otherwise reported in HPI.  Physical Exam Updated Vital Signs BP (!) 114/101   Pulse 76   Temp 98.6 F (37 C)   Resp 18   Ht 5\' 10"  (1.778 m)   Wt 90.2 kg   SpO2 100%   BMI 28.53 kg/m  Physical Exam General: Normal appearing male, lying in bed.  HEENT: Sclera anicteric, MMM, trachea midline.  Cardiology: RRR, no murmurs/rubs/gallops. BL radial and DP pulses equal bilaterally.  Resp: Normal respiratory rate and effort. CTAB, no wheezes, rhonchi, crackles.  Abd: Soft, non-tender, non-distended. No rebound tenderness or guarding.  GU: Deferred. MSK: No peripheral  edema or signs of trauma. Extremities without deformity or TTP. No cyanosis or clubbing. Skin: warm, dry. No rashes or lesions. Back: No CVA tenderness Neuro: A&Ox4, CNs II-XII grossly intact. MAEs. Sensation grossly intact.  Psych: Normal mood and affect.   ED Results / Procedures / Treatments   Labs (all labs ordered are listed, but only abnormal results are displayed) Labs Reviewed  BASIC METABOLIC PANEL WITH GFR - Abnormal; Notable for the following components:      Result Value   BUN 21 (*)    Creatinine, Ser 1.57 (*)    GFR, Estimated 51 (*)    All other components within normal limits  RAPID URINE DRUG SCREEN, HOSP PERFORMED - Abnormal; Notable for the following components:   Cocaine POSITIVE (*)    Tetrahydrocannabinol POSITIVE (*)    All other components within normal limits  TROPONIN I (HIGH SENSITIVITY) - Abnormal; Notable for the following components:   Troponin I (High Sensitivity) 30 (*)    All other components within normal limits  TROPONIN I (HIGH SENSITIVITY) - Abnormal; Notable for the following components:   Troponin I (High Sensitivity) 30 (*)    All other components within normal limits  CBC  HEPARIN LEVEL (UNFRACTIONATED)  CBC  HIV ANTIBODY (ROUTINE TESTING W REFLEX)  BASIC METABOLIC PANEL WITH GFR  LIPID PANEL  TROPONIN I (HIGH SENSITIVITY)    EKG EKG Interpretation Date/Time:  Wednesday June 15 2023 15:13:40 EDT Ventricular Rate:  98 PR Interval:  132 QRS Duration:  88 QT Interval:  358 QTC Calculation: 457 R Axis:   91  Text Interpretation: Normal sinus rhythm Right atrial enlargement Rightward axis Moderate voltage criteria for LVH, may be normal variant ( Sokolow-Lyon , Cornell product ) No significant change since last tracing Confirmed by Vivi Barrack [phone]) on 06/15/2023 4:41:24 PM  Radiology CT Angio Chest PE W and/or Wo Contrast Result Date: 06/15/2023 CLINICAL DATA:  SOB, chest pain EXAM: CT ANGIOGRAPHY CHEST WITH CONTRAST TECHNIQUE:  Multidetector CT imaging of the chest was performed using the standard protocol during bolus administration of intravenous contrast. Multiplanar CT image reconstructions and MIPs were obtained to evaluate the vascular anatomy. RADIATION DOSE REDUCTION: This exam was performed according to the departmental dose-optimization program which includes automated exposure control, adjustment of the mA and/or kV according to patient size and/or use of iterative reconstruction technique. CONTRAST:  75mL OMNIPAQUE IOHEXOL 350 MG/ML SOLN COMPARISON:  None Available. FINDINGS: Pulmonary Embolism: No pulmonary embolism. Apparent filling defect within an inferior segmental lingular branch, likely artifact due to motion. Cardiovascular: No cardiomegaly or pericardial effusion. Dilation of the aortic root measuring 4.7 cm, unchanged. Similar, mild fusiform dilation of the ascending aorta, measuring 4.1 cm. Changes of  a prior CABG procedure. Mediastinum/Nodes: No mediastinal mass. No mediastinal, hilar, or axillary lymphadenopathy. Lungs/Pleura: The trachea is midline and patent married mild diffuse bronchial wall thickening. Posterior bibasilar dependent atelectasis. No focal airspace consolidation, pleural effusion, or pneumothorax. Musculoskeletal: No acute fracture or destructive bone lesion. Sternotomy wires with well-healed sternum. No sternal dehiscence. Upper Abdomen: Unchanged bilateral perinephric stranding, left greater than right, with a punctate right upper pole calculus. Otherwise, no acute abnormality within the partially visualized upper abdomen. Review of the MIP images confirms the above findings. IMPRESSION: 1. No acute intrathoracic abnormality; specifically, no pulmonary embolism, pneumonia, or pleural effusion. 2. Similar dilation of the aortic root and ascending aorta, as measured above. Ascending thoracic aortic aneurysm. Recommend semi-annual imaging followup by CTA or MRA and referral to cardiothoracic  surgery if not already obtained. This recommendation follows 2010 ACCF/AHA/AATS/ACR/ASA/SCA/SCAI/SIR/STS/SVM Guidelines for the Diagnosis and Management of Patients With Thoracic Aortic Disease. Circulation. 2010; 121: X914-N829. Aortic aneurysm NOS (ICD10-I71.9) Aortic Atherosclerosis (ICD10-I70.0). Electronically Signed   By: Rance Burrows M.D.   On: 06/15/2023 19:33   DG Chest 2 View Result Date: 06/15/2023 CLINICAL DATA:  Chest pain EXAM: CHEST - 2 VIEW COMPARISON:  Multiple, most recently January 19, 2021 FINDINGS: No focal airspace consolidation, pleural effusion, or pneumothorax. No cardiomegaly. CABG markers and sternotomy wires. No acute fracture or destructive lesion. IMPRESSION: No acute cardiopulmonary abnormality. Electronically Signed   By: Rance Burrows M.D.   On: 06/15/2023 19:18    Procedures .Critical Care  Performed by: Merdis Stalling, MD Authorized by: Merdis Stalling, MD   Critical care provider statement:    Critical care time (minutes):  35   Critical care was necessary to treat or prevent imminent or life-threatening deterioration of the following conditions:  Cardiac failure   Critical care was time spent personally by me on the following activities:  Development of treatment plan with patient or surrogate, discussions with consultants, evaluation of patient's response to treatment, examination of patient, ordering and review of laboratory studies, ordering and review of radiographic studies, ordering and performing treatments and interventions, pulse oximetry, re-evaluation of patient's condition, review of old charts and obtaining history from patient or surrogate   Care discussed with: admitting provider       Medications Ordered in ED Medications  heparin bolus via infusion 4,000 Units (4,000 Units Intravenous Bolus from Bag 06/15/23 2143)    Followed by  heparin ADULT infusion 100 units/mL (25000 units/250mL) (1,100 Units/hr Intravenous New Bag/Given 06/15/23  2144)  aspirin EC tablet 81 mg (has no administration in time range)  atorvastatin (LIPITOR) tablet 80 mg (has no administration in time range)  carvedilol (COREG) tablet 3.125 mg (has no administration in time range)  losartan (COZAAR) tablet 50 mg (has no administration in time range)  ranolazine (RANEXA) 12 hr tablet 1,000 mg (has no administration in time range)  cilostazol (PLETAL) tablet 100 mg (has no administration in time range)  gabapentin (NEURONTIN) capsule 300 mg (has no administration in time range)  lidocaine (LIDODERM) 5 % 1 patch (has no administration in time range)  acetaminophen (TYLENOL) tablet 650 mg (has no administration in time range)    Or  acetaminophen (TYLENOL) suppository 650 mg (has no administration in time range)  polyethylene glycol (MIRALAX / GLYCOLAX) packet 17 g (has no administration in time range)  nitroGLYCERIN 50 mg in dextrose 5 % 250 mL (0.2 mg/mL) infusion (has no administration in time range)  ondansetron (ZOFRAN) injection 4 mg (4 mg Intravenous Given  06/15/23 1848)  morphine (PF) 4 MG/ML injection 4 mg (4 mg Intravenous Given 06/15/23 1849)  iohexol (OMNIPAQUE) 350 MG/ML injection 75 mL (75 mLs Intravenous Contrast Given 06/15/23 1746)    ED Course/ Medical Decision Making/ A&P                          Medical Decision Making Amount and/or Complexity of Data Reviewed Labs: ordered. Decision-making details documented in ED Course. Radiology: ordered. Decision-making details documented in ED Course.  Risk Prescription drug management. Decision regarding hospitalization.    This patient presents to the ED for concern of chest pain, this involves an extensive number of treatment options, and is a complaint that carries with it a high risk of complications and morbidity.  I considered the following differential and admission for this acute, potentially life threatening condition.   MDM:    DDX for chest pain includes but is not limited  to: Greatest concern for ACS. No PTX/PNA/pleural effusion/pulm edema on CXR. Patient cannot PERC out based on age, CT PE ordered from triage, neg for PE or any aortic findings as well. Trops 30x30, mildly elevated but flat. Highest degree of c/f unstable angina with worsening exertional CP but also at rest. Wasn't significantly helped by nitroglycerin earlier, will treat with morphine and can try additional nitroglycerin.   Clinical Course as of 06/15/23 2254  Wed Jun 15, 2023  1631 Troponin I (High Sensitivity)(!): 30 [HN]  1913 Troponin I (High Sensitivity)(!): 30 flat [HN]  1938 CT Angio Chest PE W and/or Wo Contrast 1. No acute intrathoracic abnormality; specifically, no pulmonary embolism, pneumonia, or pleural effusion. 2. Similar dilation of the aortic root and ascending aorta, as measured above. Ascending thoracic aortic aneurysm. Recommend semi-annual imaging followup by CTA or MRA and referral to cardiothoracic surgery if not already obtained. This recommendation follows 2010 ACCF/AHA/AATS/ACR/ASA/SCA/SCAI/SIR/STS/SVM Guidelines for the Diagnosis and Management of Patients With Thoracic Aortic Disease. Circulation. 2010; 121: J191-Y782. Aortic aneurysm NOS (ICD10-I71.9)  Aortic Atherosclerosis (ICD10-I70.0).   [HN]  9562 D/w Cardiology who recommends heparin, medicine admit, cards will consult tonight. [HN]    Clinical Course User Index [HN] Loetta Rough, MD    Labs: I Ordered, and personally interpreted labs.  The pertinent results include:  those listed above  Imaging Studies ordered: CXR/CT PE ordered from triage I independently visualized and interpreted imaging. I agree with the radiologist interpretation  Additional history obtained from chart review.    Cardiac Monitoring: The patient was maintained on a cardiac monitor.  I personally viewed and interpreted the cardiac monitored which showed an underlying rhythm of: NSR  Reevaluation: After the  interventions noted above, I reevaluated the patient and found that they have :improved  Social Determinants of Health: Lives independently  Disposition:  Admitted to hospitalist w/ cards following  Co morbidities that complicate the patient evaluation  Past Medical History:  Diagnosis Date   Abnormal stress test    Anxiety    Asthma    as a child   Atypical chest pain 05/07/2017   Coronary artery disease    DDD (degenerative disc disease), lumbar 11/08/2019   Formatting of this note might be different from the original. 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 [place]   Hyperlipidemia    Myocardial infarction [facility]) 2017   PVD (peripheral vascular disease) (HCC) 09/21/2019   Shortness of breath    Tobacco use disorder  Medicines Meds ordered this encounter  Medications   DISCONTD: aspirin EC tablet 325 mg   DISCONTD: nitroGLYCERIN (NITROSTAT) SL tablet 0.4 mg   ondansetron (ZOFRAN) injection 4 mg   morphine (PF) 4 MG/ML injection 4 mg   iohexol (OMNIPAQUE) 350 MG/ML injection 75 mL   FOLLOWED BY Linked Order Group    heparin bolus via infusion 4,000 Units    heparin ADULT infusion 100 units/mL (25000 units/250mL)   aspirin EC tablet 81 mg   atorvastatin (LIPITOR) tablet 80 mg   carvedilol (COREG) tablet 3.125 mg   losartan (COZAAR) tablet 50 mg   ranolazine (RANEXA) 12 hr tablet 1,000 mg   cilostazol (PLETAL) tablet 100 mg   gabapentin (NEURONTIN) capsule 300 mg   lidocaine (LIDODERM) 5 % 1 patch   OR Linked Order Group    acetaminophen (TYLENOL) tablet 650 mg    acetaminophen (TYLENOL) suppository 650 mg   polyethylene glycol (MIRALAX / GLYCOLAX) packet 17 g   DISCONTD: aspirin EC tablet 325 mg   nitroGLYCERIN 50 mg in dextrose 5 % 250 mL (0.2 mg/mL) infusion    I have reviewed the patients home medicines and have made adjustments as needed  Problem List / ED Course: Problem List Items Addressed This Visit       Cardiovascular  and Mediastinum   * (Principal) Unstable angina (HCC) - Primary   Relevant Medications   heparin ADULT infusion 100 units/mL (25000 units/250mL)   aspirin EC tablet 81 mg (Start on 06/16/2023 10:00 AM)   atorvastatin (LIPITOR) tablet 80 mg (Start on 06/16/2023 10:00 AM)   carvedilol (COREG) tablet 3.125 mg (Start on 06/16/2023  8:00 AM)   losartan (COZAAR) tablet 50 mg (Start on 06/16/2023 10:00 AM)   ranolazine (RANEXA) 12 hr tablet 1,000 mg   nitroGLYCERIN 50 mg in dextrose 5 % 250 mL (0.2 mg/mL) infusion                This note was created using dictation software, which may contain spelling or grammatical errors.    Merdis Stalling, MD 06/15/23 [phone]

## 2023-06-15 NOTE — H&P (Cosign Needed Addendum)
 Hospital Admission History and Physical Service Pager: [phone]  Patient name: Benjamin Espinoza Medical record number: [id number] Date of Birth: 1965-09-23 Age: 58 y.o. Gender: male  Primary Care Provider: Ivonne Andrew, NP Consultants: Cardiology Code Status: Full code  Preferred Emergency Contact:  Contact Information     Name Relation Home Work Mobile   Browns Sister   [phone]      Other Contacts   None on File     Chief Complaint: chest pain  Assessment and Plan: Benjamin Espinoza is a 58 y.o. male presenting with chest pain. Differential for presentation of this includes angina, MI, GERD.   - Cardiac etiology most concerning given patient's extensive cardiac history and presenting symptoms. Patient does fit criteria for unstable angina with CP not improved by rest, with no evidence of ST elevations on EKG.  - Less likely GERD though patient does report "heartburn" as initial symptoms prior to development of chest pain.  Assessment & Plan Chest pain Consistent with unstable angina after stopping medications 3 days ago due to homelessness and difficulty accessing his medications. Cardiology has been consulted and will follow. EKG without evidence of MI. Patient stable at this time. - Admit to FMTS, attending Dr. McDiarmid - Progressive, Vital signs per floor - CCM - PT/OT to treat - VTE prophylaxis - IV Heparin per pharmacy - repeat troponin now to trend - AM CBC, BMP, lipid panel - Cardiology consulted, appreciate recommendations as below: - ASA 81 mg daily to start tomorrow AM - continue home atorvastatin, coreg, pletal, losartan, ranexa - initiate nitroglycerin gtt, titrate to CP resolution and SBP 110-120 - will need to restart home Eliquis once off of Heparin - Echo Chronic health problem HTN - continue home coreg, losartan HLD - continue home atorvastatin PAD - s/p ASA 324 mg today, start ASA 81 mg daily 4/17. Continue home  atorvastatin. Will need to restart Xarelto (unknown home dose) once off of Heparin. Unhoused; substance use - TOC consulted   FEN/GI: Regular diet VTE Prophylaxis: IV Heparin per pharmacy  Disposition: Progressive  History of Present Illness:  Benjamin Espinoza is a 58 y.o. male presenting with chest pain worsening over the last 3 days.   Has had chest pains for about 2 years now. This morning reports worsening of chronic "heartburn" pressure feeling on L side of chest with SOB. Endorses "stabbing" pain on R side of chest. Had been out of meds for about 3 days. Pain/SOB worse w/ exertion including walking 2 steps or standing up. R arm numbness up to neck since this morning as well.   Has been homeless for about 4 weeks since returning to the [place] area. Has been trying to get into a living facility. Does have some family in the area but no one he could stay with.   In the ED, labs revealed elevated troponins. EKG without evidence of STEMI. Imaging without acute findings on CXR, no evidence of PE. Cardiology was consulted by EDP and will follow patient.  Review Of Systems: Per HPI.  Pertinent Past Medical History: Asthma CAD PVD Lumbar degenerative disc disease MI H/o MI, CABG (2017) HLD  Remainder reviewed in history tab.   Pertinent Past Surgical History: CABG (2017)  Remainder reviewed in history tab.   Pertinent Social History: Tobacco use: Yes - 3 cigarettes per day; has recently cut down a lot. X30+ years Alcohol use: none Other Substance use: Marijuana, cocaine about 1 week ago Lives with - unhoused  Pertinent Family History: Heart disease - mother, father, sister Remainder reviewed in history tab.   Important Outpatient Medications: Aspirin 81 mg daily Atorvastatin 80 mg daily Coreg 3.125 mg twice daily Cilostazol 100 mg BID Gabapentin 300 mg 3 times daily Losartan 50 mg daily Methocarbamol 500 mg twice daily - not taking Ranolazine 1000 mg twice  daily Solifenacin 5 mg daily - not taking regularly Oxycodone reported about 5 months ago Remainder reviewed in medication history.   Objective: BP (!) 114/101   Pulse 76   Temp 98.6 F (37 C)   Resp 18   Ht 5\' 10"  (1.778 m)   Wt 90.2 kg   SpO2 100%   BMI 28.53 kg/m  Exam: General: Well-appearing, pleasant, no acute distress. HEENT: normocephalic, PERRLA, EOM grossly intact, MMM. Cardio: Regular rate, regular rhythm, no murmurs on exam. Pulm: Clear, no wheezing, no crackles. No increased work of breathing. Abdominal: bowel sounds present, soft, non-distended. Mild TTP in RUQ and periumbilical. Extremities: no peripheral edema. Moves all extremities equally. Neuro: Alert and oriented x3, speech normal in content, no facial asymmetry. Psych:  Cognition and judgment appear intact. Alert, communicative, and cooperative with normal attention span and concentration.   Labs:  CBC BMET  Recent Labs  Lab 06/15/23 1515  WBC 6.9  HGB 15.3  HCT 46.1  PLT 265   Recent Labs  Lab 06/15/23 1515  NA 135  K 3.9  CL 105  CO2 22  BUN 21*  CREATININE 1.57*  GLUCOSE 90  CALCIUM 9.8     Troponin 30 > 30  EKG: NSR. No evidence of Qtc prolongation or ST elevation.  4/16 CXR: IMPRESSION: No acute cardiopulmonary abnormality.  4/16 CT PE: IMPRESSION: 1. No acute intrathoracic abnormality; specifically, no pulmonary embolism, pneumonia, or pleural effusion. 2. Similar dilation of the aortic root and ascending aorta, as measured above. Ascending thoracic aortic aneurysm. Recommend semi-annual imaging followup by CTA or MRA and referral to cardiothoracic surgery if not already obtained. This recommendation follows 2010 ACCF/AHA/AATS/ACR/ASA/SCA/SCAI/SIR/STS/SVM Guidelines for the Diagnosis and Management of Patients With Thoracic Aortic Disease. Circulation. 2010; 121: E332-R518. Aortic aneurysm NOS (ICD10-I71.9)   Aortic Atherosclerosis (ICD10-I70.0).   Omar Bibber,  DO 06/15/2023, 10:33 PM PGY-1, [facility]  FPTS Intern pager: [phone], text pages welcome Secure chat group [facility] Teaching Service      FPTS Upper-Level Resident Addendum   I have independently interviewed and examined the patient. I have discussed the above with Dr. Bernardino Bridge and agree with the documented plan. My edits for correction/addition/clarification are included above. Please see any attending notes.   Edison Gore, MD PGY-2, [facility] 06/15/2023 10:57 PM  FPTS Service pager: [phone] (text pages welcome through [employer])

## 2023-06-15 NOTE — ED Triage Notes (Signed)
 Pt c/o midsternal chest pain that radiates to right chest and right arm numbness, SOB started this morning. Pt states has not taken his meds the past 4 days, because he's homeless and had to stash his meds and not keep them on him.

## 2023-06-15 NOTE — Progress Notes (Signed)
 PHARMACY - ANTICOAGULATION CONSULT NOTE  Pharmacy Consult for heparin Indication: Unstable Angina  Allergies  Allergen Reactions   Isosorbide     Other Reaction(s): Other (See Comments)  Severe headache    Patient Measurements: Height: 5\' 10"  (177.8 cm) Weight: 90.2 kg (198 lb 13.7 oz) IBW/kg (Calculated) : 73 HEPARIN DW (KG): 90.2  Vital Signs: Temp: 98.6 F (37 C) (04/16 1511) BP: 131/93 (04/16 1511) Pulse Rate: 93 (04/16 1511)  Labs: Recent Labs    06/15/23 1515 06/15/23 1717  HGB 15.3  --   HCT 46.1  --   PLT 265  --   CREATININE 1.57*  --   TROPONINIHS 30* 30*    Estimated Creatinine Clearance: 58.7 mL/min (A) (by C-G formula based on SCr of 1.57 mg/dL (H)).   Medical History: Past Medical History:  Diagnosis Date   Abnormal stress test    Anxiety    Asthma    as a child   Atypical chest pain 05/07/2017   Coronary artery disease    DDD (degenerative disc disease), lumbar 11/08/2019   Formatting of this note might be different from the original. 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 Patient not taking any anticoagulant prior to arrival. Pharmacy has been consulted for heparin dosing.   4/16: Hgb 15.3, PLT 265  Goal of Therapy:  Heparin level 0.3-0.7 units/ml Monitor platelets by anticoagulation protocol: Yes   Plan:  Heparin 4000 units x1, then heparin 1100 units/hr Monitor heparin level in 6 hours Monitor CBC and s/sx of bleeding daily F/u plans for [facility]  Volney Grumbles, PharmD PGY-1 Acute Care Pharmacy Resident 06/15/2023 8:10 PM

## 2023-06-15 NOTE — Telephone Encounter (Signed)
 Copied from CRM [id number]. Topic: Medical Record Request - Records Request >> Jun 15, 2023  1:37 PM Ivette P wrote: Reason for CRM: Pt calling in following up on [employer] form that was faxed over, pt is currently waiting for the form to be filled out so he can get housing.   Pt would like to be contacted when they are ready for pick up. This is urgent matter for pt.   Pt callback [phone]  Pt was caleed and advised to call for an ambulance to come and pick him up . K[facility]

## 2023-06-15 NOTE — Assessment & Plan Note (Addendum)
 Consistent with unstable angina after stopping medications 3 days ago due to homelessness and difficulty accessing his medications. Cardiology has been consulted and will follow. EKG without evidence of MI. Patient stable at this time. - Admit to FMTS, attending Dr. McDiarmid - Progressive, Vital signs per floor - CCM - PT/OT to treat - VTE prophylaxis - IV Heparin per pharmacy - repeat troponin now to trend - AM CBC, BMP, lipid panel - Cardiology consulted, appreciate recommendations as below: - ASA 81 mg daily to start tomorrow AM - continue home atorvastatin, coreg, pletal, losartan, ranexa - initiate nitroglycerin gtt, titrate to CP resolution and SBP 110-120 - will need to restart home Eliquis once off of Heparin - Echo

## 2023-06-15 NOTE — ED Provider Triage Note (Signed)
 Emergency Medicine Provider Triage Evaluation Note  Benjamin Espinoza , a 58 y.o. male  was evaluated in triage.  Pt complains of chest pain and shortness of breath.  Patient reports that he has not taken his medications in the past couple days.  Has been without his Xarelto for 3 days.  Has been having chest pain, neck pain and numbness down the right arm that began today.  Has been feeling shortness of breath as well.  Worse with movement.  History of CABG and NSTEMI.  Review of Systems  Positive: Chest pain, shortness of breath, lightheaded Negative: Fevers, nausea, vomiting, diarrhea  Physical Exam  BP (!) 131/93   Pulse 93   Temp 98.6 F (37 C)   Resp 17   Ht 5\' 10"  (1.778 m)   Wt 90.2 kg   SpO2 97%   BMI 28.53 kg/m  Gen:   Awake, no distress   Resp:  Normal effort  MSK:   Moves extremities without difficulty  Other:  No focal neurologic deficits. Medical Decision Making  Medically screening exam initiated at 3:25 PM.  Appropriate orders placed.  Benjamin Espinoza was informed that the remainder of the evaluation will be completed by another provider, this initial triage assessment does not replace that evaluation, and the importance of remaining in the ED until their evaluation is complete.    Sonnie Dusky, PA-C 06/15/23 1533

## 2023-06-15 NOTE — Telephone Encounter (Signed)
 Copied from CRM [id number]. Topic: Clinical - Red Word Triage >> Jun 15, 2023  1:42 PM Ivette P wrote: [facility] that prompted transfer to Nurse Triage: lothargic, fall risk, not taking meds past 3 days. Cardio issues.   Chief Complaint: neuro problem  Symptoms: woke up feeling lethargic, R arm numbness, SOB with walking short distance, chest pain Frequency: 2 days, not had cardiac meds x 4 days  Pertinent Negatives: NA Disposition: [x] ED /[] Urgent Care (no appt availability in office) / [] Appointment(In office/virtual)/ []  [facility] [place] [facility]/ [] Home Care/ [] Refused Recommended Disposition /[] [facility]/ []  Follow-up with PCP Additional Notes: pt calling d/t homeless and placed his meds in a safe place but has been unable to get to them to take in 4 days. Pt states he has had several different hospital trips in past several months and several quad bypasses in past as well. Pt reporting sx above and was unsure if he needed to take meds or go to ED. I advised ED based on sx but recommended he keep atleast cardiac meds with him at all times so he doesn't miss doses. Pt verbalized understanding and agreed to go to ED. Attempted to call [facility] but no answer x 2.   Reason for Disposition  Patient sounds very sick or weak to the triager  Answer Assessment - Initial Assessment Questions 1. SYMPTOM: "What is the main symptom you are concerned about?" (e.g., weakness, numbness)     Woke up feeling lethargic and fall risk  2. ONSET: "When did this start?" (minutes, hours, days; while sleeping)     Not been taking meds past 3 days  4. PATTERN "Does this come and go, or has it been constant since it started?"  "Is it present now?"     Comes and goes when getting up and moving around  5. CARDIAC SYMPTOMS: "Have you had any of the following symptoms: chest pain, difficulty breathing, palpitations?"     Pressure in chest, "heart burn feeling"  6. NEUROLOGIC SYMPTOMS: "Have you had any of the  following symptoms: headache, dizziness, vision loss, double vision, changes in speech, unsteady on your feet?"     R arm numbness  7. OTHER SYMPTOMS: "Do you have any other symptoms?"  Protocols used: Neurologic Deficit-A-AH

## 2023-06-15 NOTE — Assessment & Plan Note (Addendum)
 HTN - continue home coreg, losartan HLD - continue home atorvastatin PAD - s/p ASA 324 mg today, start ASA 81 mg daily 4/17. Continue home atorvastatin. Will need to restart Xarelto (unknown home dose) once off of Heparin. Unhoused; substance use - TOC consulted

## 2023-06-15 NOTE — Consult Note (Signed)
 Cardiology Consultation   Patient ID: Benjamin Espinoza MRN: [id number]; DOB: Jul 10, 1965  Admit date: 06/15/2023 Date of Consult: 06/15/2023  PCP:  Ivonne Andrew, NP   [facility] Providers Cardiologist:  Thomasene Ripple, DO        Patient Profile:   Benjamin Espinoza is a 58 y.o. male with a hx of obstructive CAD s/p CABG in 2017 in [place], on April 24, 2019, PAD (s/p right femoral to below knee popliteal bypass with PTFE by Dr. [name] on 12/25/19 and Right femoral to below-knee popliteal artery bypass with 6 mm PTFE 09/21/22), hypertension, hyperlipidemia, tobacco use, chronic pain who is being seen 06/15/2023 for the evaluation of chest pain at the request of Dr. Jearld Fenton.  History of Present Illness:   Benjamin Espinoza states that he has housing instability and misplaced his medications last time he was at his cousin's house about 3 days ago.  Since then, he has been without atorvastatin, carvedilol, cilostazol, losartan, ranolazine, Xarelto and aspirin.  He has noticed progressive chest discomfort with exertion.  Starting today, 4/16, he had noticed rest chest pain that he states feels like a band across his chest with right arm numbness associated.  Also feels like he has exertional dyspnea with mild dyspnea at rest.  He states that symptoms feel similar to chest discomfort symptoms he has had in the past that ultimately led to repeat cardiac catheterizations and progression of his coronary/bypass disease.  He also tells me that he has been taking Xarelto twice daily, though he cannot state what dose.  He has been off Plavix since redo right fem popliteal bypass in 08/2022.  He continues to smoke cigarettes.  Last coronary and bypass angiography was performed in 2022 which revealed a totally occluded and previously stented SVG graft to the RCA as well as progression of native left sided disease.  Medical management was recommended at that time.  He has a history of noncompliance in  terms of his cardiovascular medications including antiplatelets and cholesterol-lowering medications.  He also has a significant smoking history with possible COPD though has never been evaluated.  On arrival to the emergency room, afebrile, HR 90s, BP 130s/90s, not requiring supplemental oxygen.  CBC unremarkable.  BMP with creatinine 1.57.  High-sensitivity troponins 30-30.  EKG with sinus rhythm and LVH, no concerning ST segment or T wave changes consistent with ischemia.  CT PE without evidence of PE, pneumonia or pleural effusion.  Also noted was mild fusiform dilation of the ascending aorta measured at 4.1 cm.  Past Medical History:  Diagnosis Date   Abnormal stress test    Anxiety    Asthma    as a child   Atypical chest pain 05/07/2017   Coronary artery disease    DDD (degenerative disc disease), lumbar 11/08/2019   Formatting of this note might be different from the original. 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 [place]   Hyperlipidemia    Myocardial infarction [facility]) 2017   PVD (peripheral vascular disease) (HCC) 09/21/2019   Shortness of breath    Tobacco use disorder     Past Surgical History:  Procedure Laterality Date   ABDOMINAL AORTOGRAM W/LOWER EXTREMITY Bilateral 09/21/2019   Procedure: ABDOMINAL AORTOGRAM W/LOWER EXTREMITY;  Surgeon: [name], MD;  Location: [facility] INVASIVE CV LAB;  Service: Cardiovascular;  Laterality: Bilateral;   ABDOMINAL AORTOGRAM W/LOWER EXTREMITY N/A 12/07/2019   Procedure: ABDOMINAL AORTOGRAM W/LOWER EXTREMITY;  Surgeon:  Dannis Dy, MD;  Location: [facility] INVASIVE CV LAB;  Service: Cardiovascular;  Laterality: N/A;   ABDOMINAL AORTOGRAM W/LOWER EXTREMITY N/A 09/09/2022   Procedure: ABDOMINAL AORTOGRAM W/LOWER EXTREMITY;  Surgeon: Young Hensen, MD;  Location: [facility] INVASIVE CV LAB;  Service: Vascular;  Laterality: N/A;   CARDIAC CATHETERIZATION     CORONARY ARTERY BYPASS GRAFT     quadruple  bypas 2017   CORONARY STENT INTERVENTION N/A 11/26/2019   Procedure: CORONARY STENT INTERVENTION;  Surgeon: Sammy Crisp, MD;  Location: [facility] INVASIVE CV LAB;  Service: Cardiovascular;  Laterality: N/A;   FEMORAL-POPLITEAL BYPASS GRAFT Right 12/25/2019   Procedure: BYPASS GRAFT FEMORAL- BELOW KNEE POPLITEAL ARTERY RIGHT USING GORE PROPATEN VASCULAR GRAFT;  Surgeon: Dannis Dy, MD;  Location: [facility] OR;  Service: Vascular;  Laterality: Right;   FEMORAL-POPLITEAL BYPASS GRAFT Right 09/21/2022   Procedure: REDO RIGHT LOWER EXTREMITY BYPASS USING GORE PROPATEN 6mm REMOVALBLE RING GRAFT;  Surgeon: Dannis Dy, MD;  Location: [facility] OR;  Service: Vascular;  Laterality: Right;   INTRAOPERATIVE ARTERIOGRAM Right 09/21/2022   Procedure: INTRA OPERATIVE ARTERIOGRAM;  Surgeon: Dannis Dy, MD;  Location: [facility] OR;  Service: Vascular;  Laterality: Right;   LEFT HEART CATH AND CORS/GRAFTS ANGIOGRAPHY N/A 11/26/2019   Procedure: LEFT HEART CATH AND CORS/GRAFTS ANGIOGRAPHY;  Surgeon: Sammy Crisp, MD;  Location: [facility] INVASIVE CV LAB;  Service: Cardiovascular;  Laterality: N/A;   LEFT HEART CATH AND CORS/GRAFTS ANGIOGRAPHY N/A 12/01/2020   Procedure: LEFT HEART CATH AND CORS/GRAFTS ANGIOGRAPHY;  Surgeon: Millicent Ally, MD;  Location: [facility] INVASIVE CV LAB;  Service: Cardiovascular;  Laterality: N/A;   LOWER EXTREMITY ANGIOGRAM Right 12/25/2019   Procedure: RIGHT LOWER EXTREMITY ANGIOGRAM;  Surgeon: Dannis Dy, MD;  Location: [facility] OR;  Service: Vascular;  Laterality: Right;   PERIPHERAL VASCULAR BALLOON ANGIOPLASTY Left 12/07/2019   Procedure: PERIPHERAL VASCULAR BALLOON ANGIOPLASTY;  Surgeon: Dannis Dy, MD;  Location: [facility] INVASIVE CV LAB;  Service: Cardiovascular;  Laterality: Left;  SFA     Home Medications:  Prior to Admission medications   Medication Sig Start Date End Date Taking? Authorizing Provider  aspirin EC 81 MG tablet Take 1 tablet (81 mg total) by  mouth daily. 09/23/22  Yes Rhyne, Maryanna Smart, PA-C  atorvastatin (LIPITOR) 80 MG tablet Take 1 tablet (80 mg total) by mouth daily. 06/08/23  Yes Jerrlyn Morel, NP  carvedilol (COREG) 3.125 MG tablet Take 1 tablet (3.125 mg total) by mouth 2 (two) times daily with a meal. 06/08/23  Yes Jerrlyn Morel, NP  cilostazol (PLETAL) 100 MG tablet Take 1 tablet (100 mg total) by mouth 2 (two) times daily. 04/07/23  Yes Roslyn Coombe, MD  cyclobenzaprine (FLEXERIL) 5 MG tablet Take 1 tablet (5 mg total) by mouth 3 (three) times daily as needed for muscle spasms. 04/07/23  Yes Roslyn Coombe, MD  gabapentin (NEURONTIN) 300 MG capsule Take 300 mg by mouth 3 (three) times daily. 04/25/23  Yes [provider]  lidocaine (LIDODERM) 5 % Place 1 patch onto the skin daily. Remove & Discard patch within 12 hours or as directed by MD 05/04/23  Yes Ceclia Cohens, Amjad, PA-C  losartan (COZAAR) 50 MG tablet Take 1 tablet (50 mg total) by mouth daily. 06/08/23  Yes Jerrlyn Morel, NP  ranolazine (RANEXA) 1000 MG SR tablet Take 1 tablet (1,000 mg total) by mouth 2 (two) times daily. 01/10/23  Yes Roslyn Coombe, MD  solifenacin (VESICARE) 5 MG tablet Take 5  mg by mouth daily. 06/02/23  Yes [provider]  methocarbamol (ROBAXIN) 500 MG tablet Take 1 tablet (500 mg total) by mouth 2 (two) times daily. Patient not taking: Reported on 06/08/2023 05/04/23   Lucina Sabal, PA-C  oxyCODONE-acetaminophen (PERCOCET) 7.5-325 MG tablet Take 1 tablet by mouth every 8 (eight) hours as needed. Patient not taking: Reported on 06/08/2023 04/08/23   [provider]    Inpatient Medications: Scheduled Meds:  aspirin EC  325 mg Oral Daily   heparin  4,000 Units Intravenous Once   Continuous Infusions:  heparin     PRN Meds: nitroGLYCERIN  Allergies:    Allergies  Allergen Reactions   Isosorbide     Other Reaction(s): Other (See Comments)  Severe headache    Social History:   Social History   Socioeconomic History    Marital status: Married    Spouse name: Not on file   Number of children: Not on file   Years of education: Not on file   Highest education level: Not on file  Occupational History   Not on file  Tobacco Use   Smoking status: Every Day    Types: Cigars, Cigarettes    Passive exposure: Current (Wife is a smoker)   Smokeless tobacco: Never   Tobacco comments:    as of 12/20/19 2 cigararettes/day  Vaping Use   Vaping status: Never Used  Substance and Sexual Activity   Alcohol use: No   Drug use: Yes    Types: Marijuana   Sexual activity: Not Currently  Other Topics Concern   Not on file  Social History Narrative   Lives with alone but has a caregiver   Social Drivers of Corporate investment banker Strain: High Risk (06/09/2023)   Overall Financial Resource Strain (CARDIA)    Difficulty of Paying Living Expenses: Very hard  Food Insecurity: Food Insecurity Present (06/09/2023)   Hunger Vital Sign    Worried About Running Out of Food in the Last Year: Often true    Ran Out of Food in the Last Year: Often true  Transportation Needs: No Transportation Needs (06/09/2023)   PRAPARE - Administrator, Civil Service (Medical): No    Lack of Transportation (Non-Medical): No  Recent Concern: Transportation Needs - Unmet Transportation Needs (06/09/2023)   PRAPARE - Transportation    Lack of Transportation (Medical): Yes    Lack of Transportation (Non-Medical): Yes  Physical Activity: Inactive (03/25/2023)   Exercise Vital Sign    Days of Exercise per Week: 0 days    Minutes of Exercise per Session: 0 min  Stress: Stress Concern Present (03/25/2023)   [employer] of Occupational Health - Occupational Stress Questionnaire    Feeling of Stress : Rather much  Social Connections: Socially Integrated (03/25/2023)   Social Connection and Isolation Panel [NHANES]    Frequency of Communication with Friends and Family: More than three times a week    Frequency of Social  Gatherings with Friends and Family: Never    Attends Religious Services: More than 4 times per year    Active Member of [employer] or Organizations: Yes    Attends Banker Meetings: Never    Marital Status: Married  Catering manager Violence: Not At Risk (06/09/2023)   Humiliation, Afraid, Rape, and Kick questionnaire    Fear of Current or Ex-Partner: No    Emotionally Abused: No    Physically Abused: No    Sexually Abused: No    Family  History:    Family History  Problem Relation Age of Onset   Heart disease Mother    Heart disease Father    Heart disease Sister      ROS:  Please see the history of present illness.   All other ROS reviewed and negative.     Physical Exam/Data:   Vitals:   06/15/23 1511 06/15/23 1512  BP: (!) 131/93   Pulse: 93   Resp: 17   Temp: 98.6 F (37 C)   SpO2: 97%   Weight:  90.2 kg  Height:  5\' 10"  (1.778 m)   No intake or output data in the 24 hours ending 06/15/23 2106    06/15/2023    3:12 PM 06/08/2023   10:41 AM 05/04/2023    7:05 AM  Last 3 Weights  Weight (lbs) 198 lb 13.7 oz 198 lb 12.8 oz 212 lb  Weight (kg) 90.2 kg 90.175 kg 96.163 kg     Body mass index is 28.53 kg/m.  General: No acute distress HEENT: normal Neck: no JVD Vascular: Distal pulses 2+ bilaterally Cardiac:  normal S1, S2; RRR; no murmur  Lungs:  clear to auscultation bilaterally, no wheezing, rhonchi or rales  Abd: soft, nontender, no hepatomegaly  Ext: no edema Musculoskeletal:  No deformities, BUE and BLE strength normal and equal Skin: warm and dry  Neuro:  CNs 2-12 intact, no focal abnormalities noted Psych:  Normal affect   EKG:  The EKG was personally reviewed and demonstrates: Normal sinus rhythm with LVH Telemetry:  Telemetry was personally reviewed and demonstrates: Sinus rhythm  Relevant CV Studies: [facility] 12/01/2020   Ost LAD to Mid LAD lesion is 100% stenosed.   Prox RCA lesion is 90% stenosed.   Prox RCA to Dist RCA lesion is 100%  stenosed.   Dist LAD-1 lesion is 100% stenosed.   Dist LAD-2 lesion is 85% stenosed.   Prox Cx to Mid Cx lesion is 100% stenosed.   1st Mrg-1 lesion is 95% stenosed.   1st Mrg-2 lesion is 95% stenosed.   Origin to Prox Graft lesion is 40% stenosed.   1st Mrg-3 lesion is 95% stenosed.   1st Mrg-4 lesion is 90% stenosed.   Ost LM to Mid LM lesion is 50% stenosed.   Prox Graft lesion is 20% stenosed.   LV end diastolic pressure is normal.   Severe multivessel native coronary artery disease with 50% diffuse left main stenosis, total LAD occlusion at the ostium, high-grade segmental stenoses in the OM1 vessel proximal to and distal to the SVG anastomosis with total occlusion of the AV groove circumflex after the OM1 takeoff and 90% proximal and total occlusion of the proximal to mid native RCA with evidence for left to right distal collateralization via the left coronary system.   Patent LIMA to LAD with previously noted chronic total occlusion of the LAD moderate segment beyond the anastomosis with small caliber distal LAD vessel after the CTO which is mildly collateralized.   Patent SVG to the left circumflex OM 1 vessel with 40% eccentric proximal graft stenosis.  The OM vessel immediately after the anastomosis is 95% stenosed followed by diffuse 90% stenosis and is a very small caliber distal vessel.   Patent SVG supplying the OM 2 vessel with mild 20% narrowing in the proximal third of the graft.  The AV groove left circumflex fills retrograde to the proximal total occlusion arising distal to the OM1 takeoff.   Occluded saphenous vein graft which was previously  stented in September 2021 with total occlusion of the ostial SVG stent with TIMI 0 flow.   Follow moderate LV dysfunction with EF estimated 40% with mid-distal inferior significant hypocontractility.  LVEDP 15 mmHg.   RECOMMENDATION: Angiograms will be reviewed with colleagues.  Compared to his September 2021 study, the vein graft  supplying the RCA which was stented ostially and distally is now totally occluded.  There is significant progression of OM1 stenoses beyond the SVG anastomosis and the vessel is very small caliber.  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  Most likely will require increased medical regimen but will review with CTO colleagues regarding potential attempt at LAD CTO intervention.  Laboratory Data:  High Sensitivity Troponin:   Recent Labs  Lab 06/15/23 1515 06/15/23 1717  TROPONINIHS 30* 30*     Chemistry Recent Labs  Lab 06/15/23 1515  NA 135  K 3.9  CL 105  CO2 22  GLUCOSE 90  BUN 21*  CREATININE 1.57*  CALCIUM 9.8  GFRNONAA 51*  ANIONGAP 8    No results for input(s): "PROT", "ALBUMIN", "AST", "ALT", "ALKPHOS", "BILITOT" in the last 168 hours. Lipids No results for input(s): "CHOL", "TRIG", "HDL", "LABVLDL", "LDLCALC", "CHOLHDL" in the last 168 hours.  Hematology Recent Labs  Lab 06/15/23 1515  WBC 6.9  RBC 4.89  HGB 15.3  HCT 46.1  MCV 94.3  MCH 31.3  MCHC 33.2  RDW 13.2  PLT 265   Thyroid No results for input(s): "TSH", "FREET4" in the last 168 hours.  BNPNo results for input(s): "BNP", "PROBNP" in the last 168 hours.  DDimer No results for input(s): "DDIMER" in the last 168 hours.   Radiology/Studies:  CT Angio Chest PE W and/or Wo Contrast Result Date: 06/15/2023 CLINICAL DATA:  SOB, chest pain EXAM: CT ANGIOGRAPHY CHEST WITH CONTRAST TECHNIQUE: Multidetector CT imaging of the chest was performed using the standard protocol during bolus administration of intravenous contrast. Multiplanar CT image reconstructions and MIPs were obtained to evaluate the vascular anatomy. RADIATION DOSE REDUCTION: This exam was performed according to the departmental dose-optimization program which includes automated exposure control, adjustment of the mA and/or kV according to patient size and/or use of iterative reconstruction technique.  CONTRAST:  75mL OMNIPAQUE IOHEXOL 350 MG/ML SOLN COMPARISON:  None Available. FINDINGS: Pulmonary Embolism: No pulmonary embolism. Apparent filling defect within an inferior segmental lingular branch, likely artifact due to motion. Cardiovascular: No cardiomegaly or pericardial effusion. Dilation of the aortic root measuring 4.7 cm, unchanged. Similar, mild fusiform dilation of the ascending aorta, measuring 4.1 cm. Changes of a prior CABG procedure. Mediastinum/Nodes: No mediastinal mass. No mediastinal, hilar, or axillary lymphadenopathy. Lungs/Pleura: The trachea is midline and patent married mild diffuse bronchial wall thickening. Posterior bibasilar dependent atelectasis. No focal airspace consolidation, pleural effusion, or pneumothorax. Musculoskeletal: No acute fracture or destructive bone lesion. Sternotomy wires with well-healed sternum. No sternal dehiscence. Upper Abdomen: Unchanged bilateral perinephric stranding, left greater than right, with a punctate right upper pole calculus. Otherwise, no acute abnormality within the partially visualized upper abdomen. Review of the MIP images confirms the above findings. IMPRESSION: 1. No acute intrathoracic abnormality; specifically, no pulmonary embolism, pneumonia, or pleural effusion. 2. Similar dilation of the aortic root and ascending aorta, as measured above. Ascending thoracic aortic aneurysm. Recommend semi-annual imaging followup by CTA or MRA and referral to cardiothoracic surgery if not already obtained. This recommendation follows 2010 ACCF/AHA/AATS/ACR/ASA/SCA/SCAI/SIR/STS/SVM Guidelines for the Diagnosis and Management of Patients With  Thoracic Aortic Disease. Circulation. 2010; 121: Z610-R604. Aortic aneurysm NOS (ICD10-I71.9) Aortic Atherosclerosis (ICD10-I70.0). Electronically Signed   By: Rance Burrows M.D.   On: 06/15/2023 19:33   DG Chest 2 View Result Date: 06/15/2023 CLINICAL DATA:  Chest pain EXAM: CHEST - 2 VIEW COMPARISON:   Multiple, most recently January 19, 2021 FINDINGS: No focal airspace consolidation, pleural effusion, or pneumothorax. No cardiomegaly. CABG markers and sternotomy wires. No acute fracture or destructive lesion. IMPRESSION: No acute cardiopulmonary abnormality. Electronically Signed   By: Rance Burrows M.D.   On: 06/15/2023 19:18     Assessment and Plan:   Obstructive CAD s/p CABG 2017 Acute on chronic chest pain concerning for unstable angina Reports acute worsening of his chronic angina in the context of stopping his medications including antiplatelet and antianginal medications about 3 days ago.  Symptoms are concerning for acute worsening of his angina due to medication noncompliance versus unstable angina with plaque rupture/progression.  He has severe coronary disease with known occluded SVG-RCA and progression of native left-sided disease on last cardiac catheterization.  At this point, recommend medical treatment for ACS with IV heparin.  Recommend restarting his antianginal therapy plus IV nitroglycerin drip for acute chest pain relief while p.o. medications take effect.  Pending response to the above therapies and review of last cardiac catheterization data with our interventional team, will decide on repeat cardiac catheterization.  He states that he is also taking Xarelto but is unclear of the dose.  My suspicion is that he is taking 2.5 mg twice daily (PAD dosing) in combination with his aspirin.  No longer on Plavix per his report. - please obtain 1 more troponin - s/p 325mg  aspirin 4/16, 81mg  daily thereafter starting 4/17 - IV heparin ACS dosing per pharmacy - will need to be restarted on home Xarelto pending plans for procedures though hold for now while on IV heparin gtt - restart home ranolazine  - home atorvastatin - obtain lipid panel - home coreg - echo - IV nitroglycerin gtt, titrate to CP free and SBP 110-120.  PAD He is s/p right femoral to below knee popliteal bypass  with PTFE by Dr. Shaunna Delaware on 12/25/19 and Right femoral to below-knee popliteal artery bypass with 6 mm PTFE 09/21/22 - aspirin, atorva as above - heparin and Xarelto plans as above  Hypertension Hyperlipidemia - plan as above   Risk Assessment/Risk Scores:     TIMI Risk Score for Unstable Angina or Non-ST Elevation MI:   The patient's TIMI risk score is 4, which indicates a 20% risk of all cause mortality, new or recurrent myocardial infarction or need for urgent revascularization in the next 14 days.          For questions or updates, please contact [facility] Please consult [email or web address] for contact info under    Signed, Chandler Combs, MD  06/15/2023 9:06 PM

## 2023-06-16 ENCOUNTER — Inpatient Hospital Stay (HOSPITAL_COMMUNITY)

## 2023-06-16 ENCOUNTER — Encounter (HOSPITAL_COMMUNITY)
Admission: EM | Discharge status: Against Medical Advice | Payer: Self-pay | Source: Home / Self Care | Attending: Family Medicine

## 2023-06-16 DIAGNOSIS — I200 Unstable angina: Secondary | ICD-10-CM | POA: Diagnosis not present

## 2023-06-16 DIAGNOSIS — I10 Essential (primary) hypertension: Secondary | ICD-10-CM

## 2023-06-16 DIAGNOSIS — I739 Peripheral vascular disease, unspecified: Secondary | ICD-10-CM | POA: Diagnosis not present

## 2023-06-16 DIAGNOSIS — I25110 Atherosclerotic heart disease of native coronary artery with unstable angina pectoris: Principal | ICD-10-CM

## 2023-06-16 DIAGNOSIS — Z5900 Homelessness unspecified: Secondary | ICD-10-CM | POA: Diagnosis not present

## 2023-06-16 DIAGNOSIS — F1410 Cocaine abuse, uncomplicated: Secondary | ICD-10-CM

## 2023-06-16 DIAGNOSIS — R079 Chest pain, unspecified: Secondary | ICD-10-CM | POA: Diagnosis not present

## 2023-06-16 DIAGNOSIS — E782 Mixed hyperlipidemia: Secondary | ICD-10-CM | POA: Diagnosis not present

## 2023-06-16 HISTORY — DX: Homelessness unspecified: Z59.00

## 2023-06-16 HISTORY — PX: LEFT HEART CATH AND CORS/GRAFTS ANGIOGRAPHY: CATH118250

## 2023-06-16 HISTORY — DX: Cocaine abuse, uncomplicated: F14.10

## 2023-06-16 LAB — ECHOCARDIOGRAM COMPLETE
AR max vel: 3.67 cm2
AV Area VTI: 3.65 cm2
AV Area mean vel: 3.17 cm2
AV Mean grad: 4 mmHg
AV Peak grad: 5.5 mmHg
Ao pk vel: 1.17 m/s
Area-P 1/2: 2.42 cm2
Height: 70 in
S' Lateral: 4.2 cm
Weight: 3181.68 [oz_av]

## 2023-06-16 LAB — CBC
HCT: 42.4 % (ref 39.0–52.0)
HCT: 41.9 % (ref 39.0–52.0)
Hemoglobin: 14.3 g/dL (ref 13.0–17.0)
Hemoglobin: 14 g/dL (ref 13.0–17.0)
MCH: 32 pg (ref 26.0–34.0)
MCH: 31.6 pg (ref 26.0–34.0)
MCHC: 33.7 g/dL (ref 30.0–36.0)
MCHC: 33.4 g/dL (ref 30.0–36.0)
MCV: 94.9 fL (ref 80.0–100.0)
MCV: 94.6 fL (ref 80.0–100.0)
Platelets: 236 10*3/uL (ref 150–400)
Platelets: 250 10*3/uL (ref 150–400)
RBC: 4.47 MIL/uL (ref 4.22–5.81)
RBC: 4.43 MIL/uL (ref 4.22–5.81)
RDW: 13.3 % (ref 11.5–15.5)
RDW: 13.2 % (ref 11.5–15.5)
WBC: 5.2 10*3/uL (ref 4.0–10.5)
WBC: 6 10*3/uL (ref 4.0–10.5)
nRBC: 0 % (ref 0.0–0.2)
nRBC: 0 % (ref 0.0–0.2)

## 2023-06-16 LAB — BASIC METABOLIC PANEL WITH GFR
Anion gap: 11 (ref 5–15)
BUN: 17 mg/dL (ref 6–20)
CO2: 18 mmol/L — ABNORMAL LOW (ref 22–32)
Calcium: 9.2 mg/dL (ref 8.9–10.3)
Chloride: 106 mmol/L (ref 98–111)
Creatinine, Ser: 1.3 mg/dL — ABNORMAL HIGH (ref 0.61–1.24)
GFR, Estimated: >60 mL/min (ref >60)
Glucose, Bld: 120 mg/dL — ABNORMAL HIGH (ref 70–99)
Potassium: 3.5 mmol/L (ref 3.5–5.1)
Sodium: 135 mmol/L (ref 135–145)

## 2023-06-16 LAB — LIPID PANEL
Cholesterol: 224 mg/dL — ABNORMAL HIGH (ref 0–200)
HDL: 34 mg/dL — ABNORMAL LOW (ref >40)
LDL Cholesterol: 173 mg/dL — ABNORMAL HIGH (ref 0–99)
Total CHOL/HDL Ratio: 6.6 ratio
Triglycerides: 84 mg/dL (ref <150)
VLDL: 17 mg/dL (ref 0–40)

## 2023-06-16 LAB — CREATININE, SERUM
Creatinine, Ser: 1.29 mg/dL — ABNORMAL HIGH (ref 0.61–1.24)
GFR, Estimated: >60 mL/min (ref >60)

## 2023-06-16 LAB — HEPARIN LEVEL (UNFRACTIONATED)
Heparin Unfractionated: 0.47 [IU]/mL (ref 0.30–0.70)
Heparin Unfractionated: 0.31 [IU]/mL (ref 0.30–0.70)

## 2023-06-16 LAB — HIV ANTIBODY (ROUTINE TESTING W REFLEX): HIV Screen 4th Generation wRfx: NONREACTIVE

## 2023-06-16 LAB — TROPONIN I (HIGH SENSITIVITY): Troponin I (High Sensitivity): 32 ng/L — ABNORMAL HIGH (ref <18)

## 2023-06-16 SURGERY — LEFT HEART CATH AND CORS/GRAFTS ANGIOGRAPHY
Anesthesia: LOCAL

## 2023-06-16 MED ORDER — FENTANYL CITRATE (PF) 100 MCG/2ML IJ SOLN
INTRAMUSCULAR | Status: DC | PRN
  Administered 2023-06-16: 25 ug via INTRAVENOUS

## 2023-06-16 MED ORDER — ASPIRIN 81 MG PO CHEW: 81.0000 mg | CHEWABLE_TABLET | ORAL | Status: DC

## 2023-06-16 MED ORDER — GABAPENTIN 300 MG PO CAPS: 300.0000 mg | ORAL_CAPSULE | Freq: Three times a day (TID) | ORAL | Status: DC | PRN

## 2023-06-16 MED ORDER — SODIUM CHLORIDE 0.9 % WEIGHT BASED INFUSION
3.0000 mL/kg/h | INTRAVENOUS | Status: DC
  Administered 2023-06-16: 3 mL/kg/h via INTRAVENOUS

## 2023-06-16 MED ORDER — LIDOCAINE HCL (PF) 1 % IJ SOLN
INTRAMUSCULAR | Status: DC | PRN
Start: 2023-06-16 — End: 2023-06-16
  Administered 2023-06-16: 2 mL

## 2023-06-16 MED ORDER — SODIUM CHLORIDE 0.9% FLUSH
3.0000 mL | Freq: Two times a day (BID) | INTRAVENOUS | Status: DC
  Administered 2023-06-16 – 2023-06-17 (×2): 3 mL via INTRAVENOUS

## 2023-06-16 MED ORDER — HEPARIN (PORCINE) IN NACL 1000-0.9 UT/500ML-% IV SOLN
INTRAVENOUS | Status: DC | PRN
  Administered 2023-06-16 (×2): 500 mL

## 2023-06-16 MED ORDER — ISOSORBIDE MONONITRATE ER 60 MG PO TB24
60.0000 mg | ORAL_TABLET | Freq: Every day | ORAL | Status: DC
  Administered 2023-06-17: 60 mg via ORAL
  Filled 2023-06-16 (×2): qty 1

## 2023-06-16 MED ORDER — HEPARIN SODIUM (PORCINE) 1000 UNIT/ML IJ SOLN
INTRAMUSCULAR | Status: AC
  Filled 2023-06-16: qty 10

## 2023-06-16 MED ORDER — SODIUM CHLORIDE 0.9% FLUSH: 3.0000 mL | INTRAVENOUS | Status: DC | PRN

## 2023-06-16 MED ORDER — SODIUM CHLORIDE 0.9 % IV SOLN: 250.0000 mL | INTRAVENOUS | Status: DC | PRN

## 2023-06-16 MED ORDER — MIDAZOLAM HCL 2 MG/2ML IJ SOLN
INTRAMUSCULAR | Status: DC | PRN
  Administered 2023-06-16: 2 mg via INTRAVENOUS

## 2023-06-16 MED ORDER — ONDANSETRON HCL 4 MG/2ML IJ SOLN: 4.0000 mg | Freq: Four times a day (QID) | INTRAMUSCULAR | Status: DC | PRN

## 2023-06-16 MED ORDER — SODIUM CHLORIDE 0.9 % WEIGHT BASED INFUSION
1.0000 mL/kg/h | INTRAVENOUS | Status: DC
  Administered 2023-06-16: 1 mL/kg/h via INTRAVENOUS

## 2023-06-16 MED ORDER — HEPARIN SODIUM (PORCINE) 1000 UNIT/ML IJ SOLN
INTRAMUSCULAR | Status: DC | PRN
  Administered 2023-06-16: 4500 [IU] via INTRAVENOUS

## 2023-06-16 MED ORDER — VERAPAMIL HCL 2.5 MG/ML IV SOLN
INTRAVENOUS | Status: AC
  Filled 2023-06-16: qty 2

## 2023-06-16 MED ORDER — SODIUM CHLORIDE 0.9 % WEIGHT BASED INFUSION: 3.0000 mL/kg/h | INTRAVENOUS | Status: DC

## 2023-06-16 MED ORDER — ENOXAPARIN SODIUM 30 MG/0.3ML IJ SOSY: 30.0000 mg | PREFILLED_SYRINGE | INTRAMUSCULAR | Status: DC

## 2023-06-16 MED ORDER — IOHEXOL 350 MG/ML SOLN
INTRAVENOUS | Status: DC | PRN
  Administered 2023-06-16: 50 mL

## 2023-06-16 MED ORDER — RIVAROXABAN 2.5 MG PO TABS
2.5000 mg | ORAL_TABLET | Freq: Two times a day (BID) | ORAL | Status: DC
  Administered 2023-06-16 – 2023-06-17 (×2): 2.5 mg via ORAL
  Filled 2023-06-16 (×2): qty 1

## 2023-06-16 MED ORDER — HYDRALAZINE HCL 20 MG/ML IJ SOLN: 10.0000 mg | INTRAMUSCULAR | Status: AC | PRN

## 2023-06-16 MED ORDER — ACETAMINOPHEN 325 MG PO TABS: 650.0000 mg | ORAL_TABLET | ORAL | Status: DC | PRN

## 2023-06-16 MED ORDER — LIDOCAINE HCL (PF) 1 % IJ SOLN
INTRAMUSCULAR | Status: AC
  Filled 2023-06-16: qty 30

## 2023-06-16 MED ORDER — SODIUM CHLORIDE 0.9 % WEIGHT BASED INFUSION
1.0000 mL/kg/h | INTRAVENOUS | Status: DC
Start: 2023-06-17 — End: 2023-06-16

## 2023-06-16 MED ORDER — VERAPAMIL HCL 2.5 MG/ML IV SOLN
INTRAVENOUS | Status: DC | PRN
  Administered 2023-06-16: 10 mL via INTRA_ARTERIAL

## 2023-06-16 MED ORDER — MIDAZOLAM HCL 2 MG/2ML IJ SOLN
INTRAMUSCULAR | Status: AC
  Filled 2023-06-16: qty 2

## 2023-06-16 MED ORDER — FENTANYL CITRATE (PF) 100 MCG/2ML IJ SOLN
INTRAMUSCULAR | Status: AC
  Filled 2023-06-16: qty 2

## 2023-06-16 MED ORDER — HEPARIN (PORCINE) 25000 UT/250ML-% IV SOLN
1200.0000 [IU]/h | INTRAVENOUS | Status: DC
  Administered 2023-06-16: 1200 [IU]/h via INTRAVENOUS
  Filled 2023-06-16: qty 250

## 2023-06-16 SURGICAL SUPPLY — 10 items
CATH INFINITI 5 FR IM (CATHETERS) IMPLANT
CATH INFINITI 5FR AL1 (CATHETERS) IMPLANT
CATH INFINITI 5FR MULTPACK ANG (CATHETERS) IMPLANT
DEVICE RAD COMP TR BAND LRG (VASCULAR PRODUCTS) IMPLANT
GLIDESHEATH SLEND SS 6F .021 (SHEATH) IMPLANT
GUIDEWIRE INQWIRE 1.5J.035X260 (WIRE) IMPLANT
INQWIRE 1.5J .035X260CM (WIRE) ×1 IMPLANT
PACK CARDIAC CATHETERIZATION (CUSTOM PROCEDURE TRAY) ×1 IMPLANT
SET ATX-X65L (MISCELLANEOUS) IMPLANT
SHEATH PROBE COVER 6X72 (BAG) IMPLANT

## 2023-06-16 NOTE — ED Notes (Signed)
 Pt admitted to [facility]

## 2023-06-16 NOTE — Telephone Encounter (Unsigned)
 Copied from CRM [id number]. Topic: Medical Record Request - Other >> Jun 16, 2023  8:25 AM Rosaria Common wrote: Reason for CRM: Pt is currently hospitalized. He is needing a SLB Form signed that will allow him to live independently. He is also waiting to be contacted by a case worker at [phone].

## 2023-06-16 NOTE — Plan of Care (Signed)
 Alert and oriented. Vitals stable. Post cath lab, TR band in place.   Problem: Education: Goal: Knowledge of General Education information will improve Description: Including pain rating scale, medication(s)/side effects and non-pharmacologic comfort measures Outcome: Progressing   Problem: Health Behavior/Discharge Planning: Goal: Ability to manage health-related needs will improve Outcome: Progressing   Problem: Clinical Measurements: Goal: Ability to maintain clinical measurements within normal limits will improve Outcome: Progressing Goal: Will remain free from infection Outcome: Progressing Goal: Diagnostic test results will improve Outcome: Progressing

## 2023-06-16 NOTE — Progress Notes (Addendum)
 3:07: CSW received call from [place], case manager [phone]) at [facility] who was requesting updates on patient's discharge plan. CSW informed Grafton Lawrence that patient is up for medical admission at this time. Veronica requested to receive updates regarding his discharge plan once medically stable.  7:59am: CSW added substance abuse and shelter resources to patient's AVS.  Shepard Dicker, MSW, LCSW Transitions of Care  Clinical Social Worker II [phone]

## 2023-06-16 NOTE — Assessment & Plan Note (Addendum)
 HTN: Continue home coreg, hold losartan HLD: continue home atorvastatin  PAD: continue home ASA, gabapentin prn, holding cilostazol for concern of HF  HLD - continue home atorvastatin PAD - s/p ASA 324 mg today, start ASA 81 mg daily 4/17. Continue home atorvastatin. Will need to restart Xarelto (unknown home dose) once off of Heparin. Unhoused; substance use - TOC consulted

## 2023-06-16 NOTE — Assessment & Plan Note (Signed)
 Likely in the setting of being off his medications and recently using cocaine.  - Cardiology consulted, appreciate recs - continue ASA 81mg  daily  - Continue IV heparin for ACS dosing pending cardiology decision for cath, will plan to transition to home xarelto after - continue IV nitroglycerin gtt, will likely d/c today after cards recs - continue home coreg, ranolazine  - Echo ordered, pending  - PT/OT to treat

## 2023-06-16 NOTE — ED Notes (Signed)
 Pt reports complete resolution of chest pain NTG gtt ongoing at 20 mcg/min. Pt remains on vs monitor call bell within reach urinal at bedside

## 2023-06-16 NOTE — Progress Notes (Signed)
 PT Cancellation Note  Patient Details Name: Benjamin Espinoza MRN: [id number] DOB: Feb 16, 1966   Cancelled Treatment:    Reason Eval/Treat Not Completed: Medical issues which prohibited therapy (Pts nurse asked to HOLD PT as pt continues to have chest pain and is going for CATH today.)   Florencia Hunter 06/16/2023, 12:34 PM Ludene Stokke M,PT Acute Rehab Services [phone]

## 2023-06-16 NOTE — Progress Notes (Signed)
 Transported off the unit to Cath Lab at this time.

## 2023-06-16 NOTE — Congregational Nurse Program (Signed)
 Client called RN and stated he was without medicine and began having chest pains and called his doctors office who told him to go to ED based on his symptoms. He is now being admitted and having surgery today. He is asking RN to pray for him for his LHC today, he didn't understand what his heart being enlarged meant or troponin's being elevated meant. RN was able to educate client on what those values meant and provide spiritual care.  Client also requesting information about his "SL2" he is working on to get into independent living facility at the beginning of the month. He states this is being done by his PCP office. RN has not been given full detail of this plan yet but RN advised client to get FL2 while he is at hospital and reach out to the CSW or TOC to see if they can assist further with getting appropriate resources for independent living options or into an ALF.  RN reached out to [facility] CSW to advocate client and see how we could assist client further. She states she can assist with FL2 if client needs it and will reach out once client gives better details of his plan.

## 2023-06-16 NOTE — Progress Notes (Signed)
 Arrived to [facility] from Emergency Department. Placed on cardiac monitor and vital signs obtained.     06/16/23 1522  Vitals  Temp 98.3 F (36.8 C)  Temp Source Oral  BP (!) 136/91  MAP (mmHg) 105  BP Location Left Arm  BP Method Automatic  Patient Position (if appropriate) Sitting  Pulse Rate (!) 59  Pulse Rate Source Monitor  ECG Heart Rate 62  Resp 16  MEWS COLOR  MEWS Score Color Green  Oxygen Therapy  SpO2 98 %  O2 Device Room Air  ECG Monitoring  PR interval 0.15  QRS interval 0.11  QT interval 0.44  QTc interval 0.46  CV Strip Heart Rate 60  Cardiac Rhythm NSR  Telemetry Box Number [id number]  Tele Box Verification Completed by Second Verifier Completed  MEWS Score  MEWS Temp 0  MEWS Systolic 0  MEWS Pulse 0  MEWS RR 0  MEWS LOC 0  MEWS Score 0

## 2023-06-16 NOTE — Care Plan (Signed)
 FMTS Brief Progress Note  S: Patient seen at bedside for post op check. He reports he is feeling much better than he did yesterday prior to his arrival to the ED. He does describe an intermittent "squeezing" feeling in his chest, particularly when he begins to get anxious about his health, housing prospects after discharge. This sensation is much milder than he was experiencing previously. Denies frank chest pain. No other concerns at this time.   O: BP (!) 138/104 (BP Location: Right Arm)   Pulse 74   Temp 98.1 F (36.7 C) (Oral)   Resp 14   Ht 6\' 1"  (1.854 m)   Wt 86.4 kg   SpO2 95%   BMI 25.13 kg/m   General: Well-appearing, pleasant, no acute distress. Cardio: Regular rate, regular rhythm, no murmurs on exam. Pulm: Clear, no wheezing, no crackles. No increased work of breathing on room air. Extremities: no peripheral edema. Moves all extremities equally.   A/P: Unstable Angina S/p L heart cath this afternoon. Doing well overall. Discussed appropriate dosing of Xarelto with pharmacy as pt reports taking this but does not know his home dosing, recommendations as below. - restart xarelto 2.5 mg BID - restart heart healthy diet - rest of plan per day team progress note  - Orders reviewed. Labs for AM ordered, which was adjusted as needed.  - If condition changes, plan includes reevaluation, EKG.   Omar Bibber, DO 06/16/2023, 9:25 PM PGY-1,  Family Medicine Night Resident  Please page [phone] with questions.

## 2023-06-16 NOTE — Progress Notes (Signed)
 Arrived back from Cath Lab at this time.

## 2023-06-16 NOTE — Interval H&P Note (Signed)
 History and Physical Interval Note:  06/16/2023 5:07 PM  [facility]  has presented today for surgery, with the diagnosis of unstable angina.  The various methods of treatment have been discussed with the patient and family. After consideration of risks, benefits and other options for treatment, the patient has consented to  Procedure(s): LEFT HEART CATH AND CORS/GRAFTS ANGIOGRAPHY (N/A) as a surgical intervention.  The patient's history has been reviewed, patient examined, no change in status, stable for surgery.  I have reviewed the patient's chart and labs.  Questions were answered to the patient's satisfaction.    Cath Lab Visit (complete for each Cath Lab visit)  Clinical Evaluation Leading to the Procedure:   ACS: Yes.    Non-ACS:    Anginal Classification: CCS III  Anti-ischemic medical therapy: No Therapy  Non-Invasive Test Results: No non-invasive testing performed  Prior CABG: Previous CABG       [facility] 06/16/2023 5:07 PM

## 2023-06-16 NOTE — Progress Notes (Signed)
     Daily Progress Note Intern Pager: [phone]  Patient name: [facility] Medical record number: [id number] Date of birth: 01/07/1966 Age: 58 y.o. Gender: male  Primary Care Provider: Jerrlyn Morel, NP Consultants: Cardiology  Code Status: Full   Pt Overview and Major Events to Date:  4/16: admitted   Assessment and Plan: 58 yo M with PMHx of CAD 2017 s/p stent placement 2021, HTN, HLD, and PAD s/p R fem-pop bypass admitted for chest pain likely in setting of unstable angina.  Assessment & Plan Chest pain Likely in the setting of being off his medications and recently using cocaine.  - Cardiology consulted, appreciate recs - continue ASA 81mg  daily  - Continue IV heparin for ACS dosing pending cardiology decision for cath, will plan to transition to home xarelto after - continue IV nitroglycerin gtt, will likely d/c today after cards recs - continue home coreg, ranolazine  - Echo ordered, pending  - PT/OT to treat  Chronic health problem HTN: Continue home coreg, hold losartan HLD: continue home atorvastatin  PAD: continue home ASA, gabapentin prn, holding cilostazol for concern of HF  HLD - continue home atorvastatin PAD - s/p ASA 324 mg today, start ASA 81 mg daily 4/17. Continue home atorvastatin. Will need to restart Xarelto (unknown home dose) once off of Heparin. Unhoused; substance use - TOC consulted  FEN/GI: Regular diet  PPx: Heparin per pharmacy  Dispo: pending TOC consult and clinical improvement, patient is currently unemployed   Subjective:  Patient reports chest pressure is still there, but better from last night.   Objective: Temp:  [98.2 F (36.8 C)-98.6 F (37 C)] 98.2 F (36.8 C) (04/17 0412) Pulse Rate:  [72-93] 80 (04/17 0600) Resp:  [11-22] 17 (04/17 0600) BP: (114-149)/(79-110) 120/90 (04/17 0600) SpO2:  [97 %-100 %] 100 % (04/17 0600) Weight:  [90.2 kg] 90.2 kg (04/16 1512) Physical Exam: General: well appearing,  NAD Cardiovascular: RRR, no m/r/g Respiratory: CTAB, NWOB on RA Abdomen: soft, NTND Extremities: no LE edema noted   Laboratory: Most recent CBC Lab Results  Component Value Date   WBC 5.2 06/16/2023   HGB 14.3 06/16/2023   HCT 42.4 06/16/2023   MCV 94.9 06/16/2023   PLT 236 06/16/2023   Most recent BMP    Latest Ref Rng & Units 06/16/2023    4:05 AM  BMP  Glucose 70 - 99 mg/dL 725   BUN 6 - 20 mg/dL 17   Creatinine 3.66 - 1.24 mg/dL 4.40   Sodium 347 - 425 mmol/L 135   Potassium 3.5 - 5.1 mmol/L 3.5   Chloride 98 - 111 mmol/L 106   CO2 22 - 32 mmol/L 18   Calcium 8.9 - 10.3 mg/dL 9.2    Lipid Panel     Component Value Date/Time   CHOL 224 (H) 06/16/2023 0405   TRIG 84 06/16/2023 0405   HDL 34 (L) 06/16/2023 0405   CHOLHDL 6.6 06/16/2023 0405   VLDL 17 06/16/2023 0405   LDLCALC 173 (H) 06/16/2023 0405    Imaging/Diagnostic Tests: No new imaging  Derril Flint Lari Linson, MD 06/16/2023, 7:12 AM  PGY-1, [facility] FPTS Intern pager: [phone], text pages welcome Secure chat group [facility] Teaching Service

## 2023-06-16 NOTE — Discharge Instructions (Addendum)
 Dear Matthews Sons,  Thank you for letting us  participate in your care. You were hospitalized for chest pain and diagnosed with Unstable angina (HCC). Cardiology evaluated you, and you underwent a coronary angiography.  POST-HOSPITAL & CARE INSTRUCTIONS If you have any new chest pain please return to the ED. Is very important you follow-up with cardiology outside of the hospital. Go to your follow up appointments (listed below)   DOCTOR'S APPOINTMENT   Future Appointments  Date Time Provider Department Center  06/28/2023  2:00 PM Land, Chrystal M, LCSW [facility] None  03/26/2024 11:30 AM [facility]-ANNUAL WELLNESS VISIT [facility] None     Take care and be well!  Family Medicine Teaching Service Inpatient Team [place]  [facility]  [address] [place], [place] [address] [phone]                                      Outpatient Substance Abuse  Treatment- uninsured  Narcotics Anonymous 24-HOUR HELPLINE Pre-recorded for Meeting Schedules [facility] [phone]  [email or web address] ALCOHOLICS ANONYMOUS  [place]   Answering Service [phone] Please Note: All [place] Meetings are Non-smoking [email or web address].es  Alcohol and Drug Services -  Insurance: Medicaid /State funding/private insurance Methadone, suboxone/Intensive outpatient  [place]   [phone] Fax: [phone] [address] [place], [place], [address] [place] [phone] Fax: [phone]    [address], [place], [place], [address] ([address] [place], [place], [place], [place], [place], [place], [place], [place]) Caring Services [email or web address] Accepts State funding/Medicaid Transitional housing, Intensive Outpatient Treatment, Outpatient treatment, Veterans Services  Phone: [phone] Fax: [phone] Address: [address] [place] [address]   [employer] of Care ([email or web address]/) Insurance:  Medicaid Case Management, Administrator, arts, Medication Management, Outpatient Therapy, Psychosocial Rehabilitation, Substance Abuse Intensive Outpatient  Phone: [phone] Fax: [phone] [address], [place], [place], [address]  [facility] Medicaid, most private insurance providers Types of Program: Individual/Group Therapy, Substance Abuse Treatment  Phone: [place] [phone] Fax: [phone] [address], [place], [place], [address] [place] [phone] [address] [place], [place], [address]  [facility]  Medicaid Types of Program: SAIOP  Phone: [phone] Fax: [phone] [address] [place], [place], [place], [address] [facility] Medicaid/state funds Crisis line [phone] [place] [facility] [phone] [place] [phone] [place] [phone]  [facility] [address];  [place], [place] [address] [phone]  Substance Abuse Intensive Outpatient Program [facility] [address] [place], [place] [address] [phone]- Substance abuse treatment  Successful Transitions  Insurance: [facility], [address], sliding scale Types of Program: substance abuse treatment, transportation assistance Phone: [phone] Fax: [phone] Address: [address], [place] [address] [facility] ([email or web address]) Insurance: UHC, [place], [place], [place] of [place]eri[place]o Program: addiction counseling, detoxification,  Phone: [phone]  Fax: [phone] Address: [address] [place], [place] [address]  [facility] (statewide facilities/programs) [address] (Medicaid/state funds) [place], [place] [address]                      [email or web address] [phone] Jayson Lyvonne Cassell- [phone] [place]- [phone] [facility] Services of the [place] (2 Locations) (Medicaid/state funds) --[address]  walk in 8:30-12 and 1-2:30 [place], [id number]  PH- [phone] --[address] [place], [place] [address]  [phone] walk in 8:30-12 and 2-3:30  [facility] state funds/medicaid [address] [place], [place] [address] [phone] [facility] (Suboxone clinic) Medicaid/state funds  [address] [place], [place] [address] [phone]   [facility]  [address], [place], [place] [address]  [phone] (24 hours) [place]- [address] [place], [place] [address]  [phone] (24 hours) [place]- [address] Alene Husk [phone] [place]- [address] [place] [phone] Carry Clapper [address] [place] [employer] [phone] [facility]- Medicaid and state funds  [place]- [address] [place], [place] [address] [phone] (24 hours) [place]- [address] [place], [place] [address] [phone] [facility]- [address] [place], [place] [address] [phone] (24 hours) [place] [address] [place], [place]  [address] [phone] [place]- [address] [place] [phone]    [facility] assistance programs. If you are behind on your bills and expenses, and need some help to make it through a short term hardship or financial emergency, there are several organizations and charities in the [place] area that may be able to help. They range from the Pathmark Stores, [employer] Global, Landscape architect of [place]  and the local community action agency, the Intel, Avnet. These groups may be able to provide you resources to help pay your utility bills, rent, and they even offer housing assistance.  Crisis assistance program Find help for paying your rent, electric bills, free food, and even funds to pay your mortgage. The [employer] Global [phone]) offers several services to local families, as funding allows. The Emergency Assistance Program (EAP), which they  administer, provides household goods, free food, clothing, and financial aid to people in need in the [place]  area. The EAP program does have some qualification, and counselors will interview clients for financial assistance by written referral only. Referrals need to be made by the Department of Social Services or by other EAP approved human services agencies or charities in the area.  Money for resources for emergency assistance are available for security deposits for rent, water, electric, and gas, past due rent, utility bills, past due mortgage payments, food, and clothing. The [employer] Global also operates a Programme researcher, broadcasting/film/video on the site. More Liberty Global.  Open Door Ministries of [employer], which can be reached at [phone], offers emergency assistance programs for those in need of help, such as food, rent assistance, a soup kitchen, shelter, and clothing. They are based in [facility] [place]  but provide a number of services to those that qualify for assistance. Continue with Open Door Ministries programs.  [facility] Department of Social Services may be able to offer temporary financial assistance and cash grants for paying rent and utilities. Help may be provided for local county residents who may be experiencing personal crisis when other resources, including government programs, are not available. Call [phone]            St. Ted Favor Society, which is based in [place], provides financial assistance of up to $50.00 to help pay for rent, utilities, cooling bills, rent, and prescription medications. The program also provides secondhand furniture to those in need. [phone]  Mattel is a Geneticist, molecular. The organization can offer emergency assistance for paying rent, electric bills, utilities, food, household products and furniture. They offer extensive emergency and  transitional  housing for families, children and single women, and also run a Boy's and Dole Food. 301 Thrift Shops, [employer], and other aid offered too. [address] [place], [place]  [address], [phone]  Additional locations of the Pathmark Stores are in [place] and other nearby communities. When you have an emergency, need free food, money for basic needs, or just need assistance around Christmas, then the Pathmark Stores may have the resources you need. Or they can refer you to nearby agencies. Learn more.  [place] Low Income Risk manager - This is offered for [facility] families. The federal government created CIT Group Program provides a one-time cash grant payment to help eligible low-income families pay their electric and heating bills. [address] [place], [place]  [address], [phone]  Government and [employer]ola - The county administers several emergency and self-sufficiency programs. Residents of [place] can get help with energy bills and food, rent, and other expenses. In addition, work with a Sports coach who may be able to help you find a job or improve your employment skills. More [place] public assistance.  [place] Emergency Assistance - A program offers emergency utility and rent funds for greater [employer] area residents. The program can also provide counseling and referrals to charities and government programs. Also provides food and a free meal program that serves lunch Mondays - Saturdays and dinner seven days per week to individuals in the community. [address] [place],   [address], [phone]  [employer] - Offers affordable apartment and housing communities across [place] and [place]. The low income and seniors can access public housing, rental assistance to qualified applicants, and apply for the section 8 rent subsidy program. Other programs  include Chiropractor and Engineer, maintenance. [address] [place], [place]  [address], dial [phone].  Basic needs such as clothing - Low income families can receive free items (school supplies, clothes, holiday assistance, etc.) from clothing closets while more moderate income [address] families can shop at Care[employer] Rx. Locations across the area help the needy. Get information on [place] Triad free clothing centers.  The [facility] provides transitional housing to veterans and the disabled. Clients will also access other services too, including life skills classes, case management, and assistance in finding permanent housing. [address], [place], [place]  [address], call [phone]  [facility] Transitional Housing in [place] is for people who were just evicted or that are formerly homeless. The non-profit will also help then gain self-sufficiency, find a home or apartment to live in, and also provides information on rent assistance when needed. Dial [phone]  [employer] Partnership to End Homelessness is available in [place]. Families that were evicted or that are homeless can gain shelter, food, clothing, furniture, and also emergency financial assistance. Other services include financial skills and life skills coaching, job training, and case management. [address], [place], [place] [address]. Telephone [phone].  The Dynegy, Avnet. runs the [employer]. This can help people save money on their heating and summer cooling bills, and is free to low income families. Free upgrades can be made to your home. Phone [phone]  Many of the non-profits and programs mentioned above are all inclusive, meaning they can meet many needs of the low income, such as energy bills, food, rent, and more. However there are several organizations that focus just on  rent and  housing. Read more on rent assistance in [place] region.  Legal assistance for evictions, foreclosure, and more If you need free legal advice on civili issues, such as foreclosures, evictions, Electronics engineer, government programs, domestic issues and more, Armed forces operational officer Aid of [place]  [facility]) is a Associate Professor firm that provides free legal services and counsel to lower income people, seniors, disabled, and others. The goal is to ensure everyone has access to justice and fair representation.  Call them at [phone], or click here to learn more about [place]  free legal assistance programs.  [place] Avnet and funds for emergency expenses The Pathmark Stores is another organization that can provide people with Deere & Company and funds to pay bills. Their assistance depends on funding, and the demand for help is always very high. They can provide cash to help pay rent, a missed mortgage payment, or gas, electric, and water bills. But the assistance doesn't stop there. They also have a food pantry on site, which can provide food once every three (3) months to people who need help. The KeyCorp can also offer a Engineering geologist once every three (3) months for a maximum three (3) times. After receiving this voucher over that period of time, applicants can receive this aid one every six (6) months after that. [phone].  Kohl's action agency The Intel, Avnet. offers job and Dispensing optician. Resources are focused on helping students obtain the skills and experiences that are necessary to compete in today's challenging and tight job market. The non-profit faith-based community action agency offers internship trainings as well as classroom instruction. Economically disadvantaged and challenged individuals and potential employers can use their services. Classes are tailored to meet the needs of people in the [facility] region. [place], [place] [address], [phone]               Foreclosure prevention services Housing Counseling and Education is also offered by [employer] of the [place]. The agency (phone number is below) is a Engineer, structural providing foreclosure advice and counseling. They offer mortgage resolution counseling and also reverse mortgage counseling. Counselors can direct people to both Kimberly-Clark, as well as [place]  foreclosure assistance options.  Warehouse manager has locations in [place] and [employer]. They run debt and foreclosure prevention programs for local families. A sampling of the programs offered include both Budget and Housing Counseling. This includes money management, financial advice, budget review and development of a written action plan with a Pensions consultant to help solve specific individual financial problems. In addition, housing and mortgage counselors can also provide pre- and post-purchase homeownership counseling, default resolution counseling (to prevent foreclosure) and reverse mortgage counseling. A Debt Management Program allows people and families with a high level of credit card or medical debt to consolidate and repay consumer debt and loans to creditors and rebuild positive credit ratings and scores. [phone]  Debt assistance programs Receive free counseling and debt help from [facility] of the [place]. The [facility] based agency can be reached at [phone]. The counselors provide free help, and the services include budget counseling. This will help people manage their expenses and set goals. They also offer a Forensic scientist, which will help individuals consolidate their debts and become debt free. Most of the workshops and services are free.  Community clinics in [place] of the  leading  health and dental centers are listed below. They may be able to provide medication, physicals, dental care, and general family care to residents of all incomes and backgrounds across the region. Some of the programs focus on the low income and underinsured. However if these clinics can't meet your needs, find information and details on more clinics in [place] .  Some of the options include [facility] of [address] This center provides free or low cost health care to low-income adults 18 - 64, who have no health insurance. Among other services offered include a pharmacy and eye clinic. Phone [phone]  [facility], which is located in [place], is a community clinic that provides primary medical and health care to uninsured and underinsured adults and families, as well as the low income, in the greater [place] area on a sliding-fee scale. Call [phone]  [place] Adult [facility] Program - They run a dental assistance program that is organized by [facility] to provide dental services and aid to [employer]. Services offered by the dental clinic are limited to extractions, pain management, and minor restorative care. [phone]  [place] [facility] has locations in [facility] and [place]. The community clinics provide complete pediatric care including primary health, mental health, social work, neurology, cardiology, asthma. Dial [phone].  In addition to those [place] and Safeway Inc, find other free community clinics in [place]  and across the county.  Food pantry and assistance Some of the local food pantries and distribution centers to call for free food and groceries include The Hive of [place] (phone [phone]), The [facility] (phone [phone]) and also PPL Corporation. Dial [phone].  Several other food banks in the region  provide clothing, free food and meals, access to soup kitchens and other help. Find the addresses and phone numbers of more food pantries in [place]. http://www.needhelppayingbills.com/html/[place]_assistance_pro.html  Northrop Grumman The United Way's "S3741234" is a great source of information about community services available.  Access by dialing 2-1-1 from anywhere in [place] , or by website -  PooledIncome.pl.   Other Armed forces technical officer Number and Address  [facility] Housing for homeless and needy men with substance abuse issues 5 day Covid Quarantine [phone] [address] [place], [place]  Goldman Sachs of [place] Emergency assistance for General Mills only Ingram [employer] Inc [phone] Ext. [address] [place], [place]  Clara Brunswick Corporation of the [place] Domestic violence shelter for women and their children [phone] [place], [place]  Family Abuse Services Domestic violence shelter for women and their children Each family gets their own unit and can quarantine after admission. [phone] [place], [place]  Interactive Resource Center [facility]) / Resources for the CIG[employer] center for the homeless Information and referral to housing resources Counseling Showers Laundry Barbershop Phone bank Mailroom Computer lab Medical clinic Bike maintenance center 14 Day covid quarantine [phone] [address] [place], [place]  [facility] - [employer] Men's Shelter Emergency housing Food Emergency financial assistance Permanent supportive housing [phone] [address] [place], [place]  The Pathmark Stores Crisis assistance Medication Housing Food Utility assistance [phone] [address] [place], [place]   [phone] [address], [place], [place]  The Monsanto Company of Merck & Co        Transitional housing Case Chartered certified accountant assistance [phone]  [address] [place], [place]  [facility], Pitney Bowes for adult men and women Can admit with MD clearance from the hospital after positive covid test.  Intake Hotline [phone] [address] [place], [place]  24-hour Crisis Line for those Facing Homelessness   Information and referral to community resources [phone]  Graybar Electric and additional resources. Can admit with MD clearance after positive covid test.  Prefer online applications (blocked on [facility] computers)/ currently full. Will be able to do intake at the office starting in May.  [phone] Admin only location     [facility]) now has a full time staff to take referrals for all individuals needing shelter or housing placement. They do not do direct services or have beds, but are in charge of assessing and coordinating placement for individuals needing shelter. The phone number for coordinated entry is [phone].

## 2023-06-16 NOTE — Progress Notes (Signed)
 PHARMACY - ANTICOAGULATION CONSULT NOTE  Pharmacy Consult for heparin Indication: Unstable Angina  Allergies  Allergen Reactions   Isosorbide     Other Reaction(s): Other (See Comments)  Severe headache    Patient Measurements: Height: 5\' 10"  (177.8 cm) Weight: 90.2 kg (198 lb 13.7 oz) IBW/kg (Calculated) : 73 HEPARIN DW (KG): 90.2  Vital Signs: Temp: 98.2 F (36.8 C) (04/17 0412) Temp Source: Oral (04/17 0412) BP: 126/95 (04/17 0500) Pulse Rate: 80 (04/17 0500)  Labs: Recent Labs    06/15/23 1515 06/15/23 1717 06/16/23 0119 06/16/23 0405  HGB 15.3  --   --  14.3  HCT 46.1  --   --  42.4  PLT 265  --   --  236  HEPARINUNFRC  --   --   --  0.47  CREATININE 1.57*  --   --  1.30*  TROPONINIHS 30* 30* 32*  --     Estimated Creatinine Clearance: 70.9 mL/min (A) (by C-G formula based on SCr of 1.3 mg/dL (H)).   Medical History: Past Medical History:  Diagnosis Date   Abnormal stress test    Anxiety    Asthma    as a child   Atypical chest pain 05/07/2017   Coronary artery disease    DDD (degenerative disc disease), lumbar 11/08/2019   Formatting of this note might be different from the original. 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 Patient not taking any anticoagulant prior to arrival. Pharmacy has been consulted for heparin dosing.   4/16: Hgb 15.3, PLT 265  4/17  AM update:  Heparin level therapeutic   Goal of Therapy:  Heparin level 0.3-0.7 units/ml Monitor platelets by anticoagulation protocol: Yes   Plan:  Cont heparin 1100 units/hr Heparin level in 8 hours  Silvestre Drum, PharmD, BCPS Clinical Pharmacist Phone: [phone]

## 2023-06-16 NOTE — Hospital Course (Addendum)
 Benjamin Espinoza is a 58 y.o.male with a history of CAD s/o sent placement, HTN, HLD, and PAD s/p R fem-pop bypass who was admitted to the [facility] Teaching Service at [facility] for chest pain. His hospital course is detailed below:  Chest pain Patient was admitted for chest pressure and pain after stopping medications three days ago due to lack of access. EKG without evidence of MI, trops flat. Likely secondary to unstable angina. Cardiology was consulted who recommended ASA 81mg , nitroglycerin  gtt. echo showed EF of 45 to 50% with several regional wall motion abnormalities.  Patient underwent cardiac catheterization which showed severe three-vessel occlusive CAD.  Cardiology did to optimize medically, prior to any percutaneous intervention.  Patient was transitioned from heparin  to an increased relative dose of 20 mg.  Patient was continued on aspirin .  Cardiology recommended to increase patient's Coreg  to 6.25 mg twice daily in addition to starting Imdur  60 mg daily.  Patient was continued on home ranolazine .   Unhoused Patient reports he has been unhoused and trying to find a place to live the last few weeks. TOC was consulted, patient left AMA before further safe dispo planning could be performed.  Other chronic conditions were medically managed with home medications and formulary alternatives as necessary (HTN, HLD, PAD)  PCP Follow-up Recommendations: Follow-up lipoprotein a Ascending thoracic aneurysm require surveillance, consider vascular referral Ensure cardiology follow-up

## 2023-06-16 NOTE — H&P (View-Only) (Signed)
 Patient Name: Benjamin Espinoza Date of Encounter: 06/16/2023 [facility] Cardiologist: Thomasene Ripple, DO (Not seen since 2023)  Interval Summary  .    Continues to have chest pain off and on on nitroglcyerin drip  Vital Signs .    Vitals:   06/16/23 0515 06/16/23 0530 06/16/23 0545 06/16/23 0600  BP:  (!) 136/92  (!) 120/90  Pulse:    80  Resp: 17 12 11 17   Temp:      TempSrc:      SpO2:    100%  Weight:      Height:        Intake/Output Summary (Last 24 hours) at 06/16/2023 0741 Last data filed at 06/16/2023 0409 Gross per 24 hour  Intake --  Output 320 ml  Net -320 ml      06/15/2023    3:12 PM 06/08/2023   10:41 AM 05/04/2023    7:05 AM  Last 3 Weights  Weight (lbs) 198 lb 13.7 oz 198 lb 12.8 oz 212 lb  Weight (kg) 90.2 kg 90.175 kg 96.163 kg      Telemetry/ECG    Personally Reviewed Independently interpreted  EKG 06/15/2023: Sinus rhythm 98 bpm Rightward axis LVH No acute ischemic abnormality  CTA chest 06/15/2023: Aortic root 4.7 cm ascending aorta 4.1 cm  Echocardiogram 11/2020: Mildly dilated LV.  EF 50 to 55% Monitor RV.  Normal systolic function. Mild to moderate left atrial dilatation. Aortic root 44 mm, ascending aorta 38 mm  Physical Exam .   Physical Exam Vitals and nursing note reviewed.  Constitutional:      General: He is not in acute distress. Neck:     Vascular: No JVD.  Cardiovascular:     Rate and Rhythm: Normal rate and regular rhythm.     Pulses:          Femoral pulses are 1+ on the right side and 1+ on the left side.      Dorsalis pedis pulses are 0 on the right side and 1+ on the left side.       Posterior tibial pulses are 0 on the right side and 1+ on the left side.     Heart sounds: Normal heart sounds. No murmur heard. Pulmonary:     Effort: Pulmonary effort is normal.     Breath sounds: Normal breath sounds. No wheezing or rales.  Musculoskeletal:     Right lower leg: No edema.     Left lower leg: No  edema.      Assessment & Plan .     58 year old male with hypertension, hyperlipidemia, nicotine dependence, prediabetes, CAD s/p CABG 2017 in [place], PCI SVG-OM 2021 subsequently occluded in 2022, PAD s/p redo Rt fem-pop bypass, TAA, presentign w/chest pain  Unstable angina: Extensive history of CAD and PAD. Chest pain shortness of breath with minimal activity, ongoing off-and-on chest pain on nitroglycerin drip. Off his medications for last 3 days due to social situation. Poor social situation with homelessness, at risk for loss of follow-up.  He is seeking help and has ongoing application for independent living through his PCP, according to the patient.  If social situation stabilizes, is he has potential for compliance with medications. Recommend coronary and bypass graft angiography for definitive evaluation.  If no acute lesion identified, he just needs to get back on baseline antianginal therapy. Continue aspirin, heparin, statin. Check echocardiogram. Resume home medication Coreg, Ranexa, hold losartan, Pletal. LDL 173, practically statin naive given recent  noncompliance. Resume atorvastatin 80 mg daily. Follow-up lipid panel check at [facility] visit.  I have reviewed the risks, indications, and alternatives to cardiac catheterization, possible angioplasty, and stenting with the patient. Risks include but are not limited to bleeding, infection, vascular injury, stroke, myocardial infection, arrhythmia, kidney injury, radiation-related injury in the case of prolonged fluoroscopy use, emergency cardiac surgery, and death. The patient understands the risks of serious complication is 1-2 in 1000 with diagnostic cardiac cath and 1-2% or less with angioplasty/stenting.    PAD: Prior redo right femoropopliteal bypass. No critical limb ischemia at this time. Continue medical management.  Hypertension: Resume Coreg, hold losartan pending cath.  Homelessness: Strongly recommend social work/case  management consult.  He reportedly has ongoing application for independent living facility through his PCP  For questions or updates, please contact [facility] Please consult [email or web address] for contact info under        Signed, Cody Das, MD

## 2023-06-16 NOTE — Telephone Encounter (Signed)
 Routed to PCP. KH

## 2023-06-16 NOTE — Progress Notes (Addendum)
 Patient Name: Benjamin Espinoza Date of Encounter: 06/16/2023 [facility] Cardiologist: Thomasene Ripple, DO (Not seen since 2023)  Interval Summary  .    Continues to have chest pain off and on on nitroglcyerin drip  Vital Signs .    Vitals:   06/16/23 0515 06/16/23 0530 06/16/23 0545 06/16/23 0600  BP:  (!) 136/92  (!) 120/90  Pulse:    80  Resp: 17 12 11 17   Temp:      TempSrc:      SpO2:    100%  Weight:      Height:        Intake/Output Summary (Last 24 hours) at 06/16/2023 0741 Last data filed at 06/16/2023 0409 Gross per 24 hour  Intake --  Output 320 ml  Net -320 ml      06/15/2023    3:12 PM 06/08/2023   10:41 AM 05/04/2023    7:05 AM  Last 3 Weights  Weight (lbs) 198 lb 13.7 oz 198 lb 12.8 oz 212 lb  Weight (kg) 90.2 kg 90.175 kg 96.163 kg      Telemetry/ECG    Personally Reviewed Independently interpreted  EKG 06/15/2023: Sinus rhythm 98 bpm Rightward axis LVH No acute ischemic abnormality  CTA chest 06/15/2023: Aortic root 4.7 cm ascending aorta 4.1 cm  Echocardiogram 11/2020: Mildly dilated LV.  EF 50 to 55% Monitor RV.  Normal systolic function. Mild to moderate left atrial dilatation. Aortic root 44 mm, ascending aorta 38 mm  Physical Exam .   Physical Exam Vitals and nursing note reviewed.  Constitutional:      General: He is not in acute distress. Neck:     Vascular: No JVD.  Cardiovascular:     Rate and Rhythm: Normal rate and regular rhythm.     Pulses:          Femoral pulses are 1+ on the right side and 1+ on the left side.      Dorsalis pedis pulses are 0 on the right side and 1+ on the left side.       Posterior tibial pulses are 0 on the right side and 1+ on the left side.     Heart sounds: Normal heart sounds. No murmur heard. Pulmonary:     Effort: Pulmonary effort is normal.     Breath sounds: Normal breath sounds. No wheezing or rales.  Musculoskeletal:     Right lower leg: No edema.     Left lower leg: No  edema.      Assessment & Plan .     58 year old male with hypertension, hyperlipidemia, nicotine dependence, prediabetes, CAD s/p CABG 2017 in [place], PCI SVG-OM 2021 subsequently occluded in 2022, PAD s/p redo Rt fem-pop bypass, TAA, presentign w/chest pain  Unstable angina: Extensive history of CAD and PAD. Chest pain shortness of breath with minimal activity, ongoing off-and-on chest pain on nitroglycerin drip. Off his medications for last 3 days due to social situation. Poor social situation with homelessness, at risk for loss of follow-up.  He is seeking help and has ongoing application for independent living through his PCP, according to the patient.  If social situation stabilizes, is he has potential for compliance with medications. Recommend coronary and bypass graft angiography for definitive evaluation.  If no acute lesion identified, he just needs to get back on baseline antianginal therapy. Continue aspirin, heparin, statin. Check echocardiogram. Resume home medication Coreg, Ranexa, hold losartan, Pletal. LDL 173, practically statin naive given recent  noncompliance. Resume atorvastatin 80 mg daily. Follow-up lipid panel check at [facility] visit.  I have reviewed the risks, indications, and alternatives to cardiac catheterization, possible angioplasty, and stenting with the patient. Risks include but are not limited to bleeding, infection, vascular injury, stroke, myocardial infection, arrhythmia, kidney injury, radiation-related injury in the case of prolonged fluoroscopy use, emergency cardiac surgery, and death. The patient understands the risks of serious complication is 1-2 in 1000 with diagnostic cardiac cath and 1-2% or less with angioplasty/stenting.    PAD: Prior redo right femoropopliteal bypass. No critical limb ischemia at this time. Continue medical management.  Hypertension: Resume Coreg, hold losartan pending cath.  Homelessness: Strongly recommend social work/case  management consult.  He reportedly has ongoing application for independent living facility through his PCP  For questions or updates, please contact [facility] Please consult [email or web address] for contact info under        Signed, Elder Negus, MD

## 2023-06-16 NOTE — Progress Notes (Signed)
 PHARMACY - ANTICOAGULATION CONSULT NOTE  Pharmacy Consult for heparin Indication: Unstable Angina  Allergies  Allergen Reactions   Isosorbide     Other Reaction(s): Other (See Comments)  Severe headache    Patient Measurements: Height: 5\' 10"  (177.8 cm) Weight: 90.2 kg (198 lb 13.7 oz) IBW/kg (Calculated) : 73 HEPARIN DW (KG): 90.2  Vital Signs: Temp: 97.5 F (36.4 C) (04/17 1217) Temp Source: Oral (04/17 1217) BP: 137/96 (04/17 1200) Pulse Rate: 64 (04/17 1200)  Labs: Recent Labs    06/15/23 1515 06/15/23 1717 06/16/23 0119 06/16/23 0405 06/16/23 1159  HGB 15.3  --   --  14.3  --   HCT 46.1  --   --  42.4  --   PLT 265  --   --  236  --   HEPARINUNFRC  --   --   --  0.47 0.31  CREATININE 1.57*  --   --  1.30*  --   TROPONINIHS 30* 30* 32*  --   --     Estimated Creatinine Clearance: 70.9 mL/min (A) (by C-G formula based on SCr of 1.3 mg/dL (H)).   Medical History: Past Medical History:  Diagnosis Date   Abnormal stress test    Anxiety    Asthma    as a child   Atypical chest pain 05/07/2017   Coronary artery disease    DDD (degenerative disc disease), lumbar 11/08/2019   Formatting of this note might be different from the original. 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 [place]    Hyperlipidemia    Myocardial infarction (HCC) 2017   PVD (peripheral vascular disease) (HCC) 09/21/2019   Shortness of breath    Tobacco use disorder     Assessment: 54 YOM presented with chest pain and concern for unstable angina. Patient not taking any anticoagulant prior to arrival. Pharmacy was consulted for heparin dosing. Currently, Hgb normal at 14.3. Platelets normal 236. BP ranging from 133/85 - 146/100; currently normotensive at 134/88. RR normal at 16-18. No bleeding noted.  Heparin Level: 4/17 0405: HL 0.47; therapeutic 4/17 1159: HL 0.31; therapeutic on low end of goal and trending down on 1100 units/hr.    Goal of Therapy:   Heparin level 0.3-0.7 units/ml Monitor platelets by anticoagulation protocol: Yes   Plan:  - Increasing heparin drip to 1200 units/hr.  - Repeat heparin level and CBC with AM labs - Monitor for signs and symptoms of bleeding  - Follow up after cath  Marvis Sluder. Wells Halim PharmD Candidate

## 2023-06-17 ENCOUNTER — Other Ambulatory Visit (HOSPITAL_COMMUNITY): Payer: Self-pay

## 2023-06-17 ENCOUNTER — Telehealth (HOSPITAL_COMMUNITY): Payer: Self-pay | Admitting: Pharmacy Technician

## 2023-06-17 ENCOUNTER — Encounter (HOSPITAL_COMMUNITY): Payer: Self-pay | Admitting: Cardiology

## 2023-06-17 ENCOUNTER — Telehealth: Payer: Self-pay | Admitting: Cardiology

## 2023-06-17 DIAGNOSIS — I5022 Chronic systolic (congestive) heart failure: Secondary | ICD-10-CM | POA: Diagnosis present

## 2023-06-17 DIAGNOSIS — I77810 Thoracic aortic ectasia: Secondary | ICD-10-CM | POA: Diagnosis present

## 2023-06-17 HISTORY — DX: Thoracic aortic ectasia: I77.810

## 2023-06-17 HISTORY — DX: Chronic systolic (congestive) heart failure: I50.22

## 2023-06-17 LAB — CBC
HCT: 43.6 % (ref 39.0–52.0)
Hemoglobin: 14.6 g/dL (ref 13.0–17.0)
MCH: 31.1 pg (ref 26.0–34.0)
MCHC: 33.5 g/dL (ref 30.0–36.0)
MCV: 92.8 fL (ref 80.0–100.0)
Platelets: 248 10*3/uL (ref 150–400)
RBC: 4.7 MIL/uL (ref 4.22–5.81)
RDW: 13.1 % (ref 11.5–15.5)
WBC: 4.7 10*3/uL (ref 4.0–10.5)
nRBC: 0 % (ref 0.0–0.2)

## 2023-06-17 LAB — BASIC METABOLIC PANEL WITH GFR
Anion gap: 9 (ref 5–15)
BUN: 10 mg/dL (ref 6–20)
CO2: 25 mmol/L (ref 22–32)
Calcium: 9.4 mg/dL (ref 8.9–10.3)
Chloride: 103 mmol/L (ref 98–111)
Creatinine, Ser: 1.3 mg/dL — ABNORMAL HIGH (ref 0.61–1.24)
GFR, Estimated: >60 mL/min (ref >60)
Glucose, Bld: 93 mg/dL (ref 70–99)
Potassium: 4 mmol/L (ref 3.5–5.1)
Sodium: 137 mmol/L (ref 135–145)

## 2023-06-17 MED ORDER — RIVAROXABAN 20 MG PO TABS
20.0000 mg | ORAL_TABLET | Freq: Every day | ORAL | 0 refills | Status: DC
Start: 2023-06-18 — End: 2023-07-01

## 2023-06-17 MED ORDER — CARVEDILOL 6.25 MG PO TABS: 6.2500 mg | ORAL_TABLET | Freq: Two times a day (BID) | ORAL | 0 refills | Status: DC

## 2023-06-17 MED ORDER — RIVAROXABAN 20 MG PO TABS: 20.0000 mg | ORAL_TABLET | Freq: Every day | ORAL | Status: DC

## 2023-06-17 MED ORDER — ISOSORBIDE MONONITRATE ER 60 MG PO TB24: 60.0000 mg | ORAL_TABLET | Freq: Every day | ORAL | 0 refills | Status: DC

## 2023-06-17 MED ORDER — CARVEDILOL 6.25 MG PO TABS: 6.2500 mg | ORAL_TABLET | Freq: Two times a day (BID) | ORAL | Status: DC

## 2023-06-17 NOTE — Telephone Encounter (Signed)
 Patient Product/process development scientist completed.    The patient is insured through [employer]. Patient has Medicare and is not eligible for a copay card, but may be able to apply for patient assistance or Medicare RX Payment Plan (Patient Must reach out to their plan, if eligible for payment plan), if available.    Ran test claim for Entresto 24-26 mg and the current 30 day co-pay is $0.00.  Ran test claim for Farxiga 10 mg and the current 30 day co-pay is $0.00.  Ran test claim for Jardiance 10 mg and the current 30 day co-pay is $0.00.  This test claim was processed through [facility]- copay amounts may vary at other pharmacies due to pharmacy/plan contracts, or as the patient moves through the different stages of their insurance plan.     Morgan Arab, CPHT Pharmacy Technician III Certified Patient Advocate [facility] Patient Advocate Team Direct Number: [phone]  Fax: [phone]

## 2023-06-17 NOTE — Evaluation (Signed)
 Physical Therapy Evaluation Patient Details Name: Benjamin Espinoza MRN: [id number] DOB: 1965/05/02 Today's Date: 06/17/2023  History of Present Illness  58 y.o. male who presents with chest pain likely due to unstable angina s/p L heart cath and and angiography. PMH i CAD s/p CABG 2017 and 2021, PAD s/p R fem/pop bypass  Clinical Impression  PTA pt living with wife/girlfriend but in the last month they have separated and he is now homeless, not sure where he is going after discharge. Pt reports using cane for ambulation for short distances but was getting increasingly difficult with angina pain with exertion. Pt able to perform ADLs and iADLs with increased time and effort. Pt is currently limited in safe mobility by increased BP and onset of angina with short distance (25 feet) ambulation requiring up to modA for steadying due to increased fatigue. PT recommending HHPT or OPPT vs Cardiac Rehab at discharge depending on discharge location. PT will continue to follow acutely.       If plan is discharge home, recommend the following: A little help with walking and/or transfers;A little help with bathing/dressing/bathroom;Assistance with cooking/housework;Assist for transportation;Help with stairs or ramp for entrance   Can travel by private vehicle    Yes    Equipment Recommendations Rollator (4 wheels)     Functional Status Assessment Patient has had a recent decline in their functional status and demonstrates the ability to make significant improvements in function in a reasonable and predictable amount of time.     Precautions / Restrictions Precautions Precautions: Fall Restrictions Weight Bearing Restrictions Per Provider Order: No      Mobility  Bed Mobility Overal bed mobility: Modified Independent             General bed mobility comments: HoB elevated and use of rails , no physical assist    Transfers Overall transfer level: Needs assistance Equipment used:  Straight cane Transfers: Sit to/from Stand Sit to Stand: Contact guard assist           General transfer comment: good power up, increased time and effort to steady, no outside assist needed    Ambulation/Gait Ambulation/Gait assistance: Contact guard assist, Mod assist Gait Distance (Feet): 25 Feet Assistive device: Straight cane Gait Pattern/deviations: Step-through pattern, Decreased stride length, Shuffle, Narrow base of support Gait velocity: variable Gait velocity interpretation: <1.31 ft/sec, indicative of household ambulator   General Gait Details: initiated with strong steady gait, fatigues quickly and needs progressively more assist, reaching out to hold himself up with handrail in hallway, decreasing BoS, by the time he returned to room, pt requiring modA for steadying to get to recliner, pt reporting fatigue and increased heartburn pain. with sitting and application of supplemental O2 via [place] pain quickly dissipates      Balance Overall balance assessment: Needs assistance Sitting-balance support: No upper extremity supported Sitting balance-Leahy Scale: Good     Standing balance support: Single extremity supported, Bilateral upper extremity supported, Reliant on assistive device for balance, During functional activity Standing balance-Leahy Scale: Poor Standing balance comment: needs at least single UE support from cane but with fatigue requires outside support                             Pertinent Vitals/Pain Pain Assessment Pain Assessment: Faces Faces Pain Scale: Hurts even more Pain Location: chest with exertion Pain Descriptors / Indicators: Grimacing, Guarding, Moaning, Other (Comment) (feels like heart burn) Pain Intervention(s): Limited activity within  patient's tolerance, Monitored during session, Other (comment) (supplemental O2)    Home Living Family/patient expects to be discharged to:: Shelter/Homeless                         Prior Function Prior Level of Function : Independent/Modified Independent             Mobility Comments: Mod I using SPC ADLs Comments: ind     Extremity/Trunk Assessment   Upper Extremity Assessment Upper Extremity Assessment: Overall WFL for tasks assessed    Lower Extremity Assessment Lower Extremity Assessment: Defer to PT evaluation    Cervical / Trunk Assessment Cervical / Trunk Assessment: Normal  Communication   Communication Communication: No apparent difficulties    Cognition Arousal: Lethargic, Alert Behavior During Therapy: WFL for tasks assessed/performed   PT - Cognitive impairments: No apparent impairments                         Following commands: Intact       Cueing Cueing Techniques: Verbal cues, Tactile cues, Visual cues     General Comments General comments (skin integrity, edema, etc.): Pt BP noted to be 129/96 in sitting and 120/94 with prolonged standing. No BP abnormalities noted.        Assessment/Plan    PT Assessment Patient needs continued PT services  PT Problem List Cardiopulmonary status limiting activity;Decreased activity tolerance;Decreased balance;Decreased mobility;Pain       PT Treatment Interventions DME instruction;Gait training;Functional mobility training;Therapeutic activities;Therapeutic exercise;Balance training    PT Goals (Current goals can be found in the Care Plan section)  Acute Rehab PT Goals PT Goal Formulation: With patient Time For Goal Achievement: 07/01/23 Potential to Achieve Goals: Fair    Frequency Min 2X/week        AM-PAC PT 6 Clicks Mobility  Outcome Measure Help needed turning from your back to your side while in a flat bed without using bedrails?: None Help needed moving from lying on your back to sitting on the side of a flat bed without using bedrails?: None Help needed moving to and from a bed to a chair (including a wheelchair)?: None Help needed standing up from a  chair using your arms (e.g., wheelchair or bedside chair)?: None Help needed to walk in hospital room?: A Lot Help needed climbing 3-5 steps with a railing? : Total 6 Click Score: 19    End of Session Equipment Utilized During Treatment: Gait belt;Oxygen Activity Tolerance: Patient limited by pain;Treatment limited secondary to medical complications (Comment) (increased fatigue with movement) Patient left: in chair;with call bell/phone within reach Nurse Communication: Mobility status;Other (comment) (increased BP with ambulation) PT Visit Diagnosis: Other abnormalities of gait and mobility (R26.89);Muscle weakness (generalized) (M62.81);Pain;Difficulty in walking, not elsewhere classified (R26.2);Unsteadiness on feet (R26.81) Pain - part of body:  (chest)    Time: 9147-9081 PT Time Calculation (min) (ACUTE ONLY): 26 min   Charges:   PT Evaluation $PT Eval Moderate Complexity: 1 Mod PT Treatments $Gait Training: 8-22 mins PT General Charges $$ ACUTE PT VISIT: 1 Visit         Oluwaseun Cremer B. Fleeta Lapidus PT, DPT Acute Rehabilitation Services Please use secure chat or  Call Office [phone]   Almarie KATHEE Fleeta [facility] 06/17/2023, 12:56 PM

## 2023-06-17 NOTE — Telephone Encounter (Signed)
 Hi can you please call patient to schedule follow up?

## 2023-06-17 NOTE — Progress Notes (Signed)
 Daily Progress Note Intern Pager: [phone]  Patient name: KAMAR CALLENDER Medical record number: [id number] Date of birth: [birth year] Age: 58 y.o. Gender: male  Primary Care Provider: Jerrlyn Morel, NP Consultants: Cardiology Code Status: Full  Pt Overview and Major Events to Date:  4/16 admitted 4/17 [facility]  Assessment and Plan:  MERRIL NAGY is a 58 y.o. male admitted for chest pain likely 2/2 unstable angina. Pertinent PMH/PSH includes CAD s/p CABG 2017 and 2021, PAD s/p R fem/pop bypass 2021 and 2024, HTN, HLD, tobacco use .  Assessment & Plan Chest pain  Unstable angina LHC on 4/17 showed severe three-vessel occlusive CAD among other findings.  Echo with EF 45-50%, grade 1 diastolic dysfunction. - Cardiology consulted, appreciate recs - continue ASA 81mg  daily  - Xarelto  2.5 mg twice daily - Imdur  60 mg daily - continue home coreg , Lipitor , ranolazine   - PT/OT - Appreciate TOC involvement in the setting of homelessness and cocaine use, both of which are contributing to his clinical picture Chronic health problem HTN: Continue home coreg , held losartan  HLD: continue home atorvastatin   PAD: continue home ASA, gabapentin  prn, held cilostazol     FEN/GI: Heart healthy PPx: Xarelto  Dispo:Pending PT recommendations  and TOC recommendations. Pending clinical improvement  Subjective:  NAEON, denies concerns this morning.  Declines chest pain.  Objective: Temp:  [97.2 F (36.2 C)-98.6 F (37 C)] 98.4 F (36.9 C) (04/18 0430) Pulse Rate:  [0-106] 73 (04/18 0430) Resp:  [11-29] 18 (04/18 0430) BP: (106-146)/(68-104) 138/91 (04/18 0430) SpO2:  [93 %-100 %] 100 % (04/18 0430) Weight:  [86.4 kg] 86.4 kg (04/17 1517) Physical Exam: General: NAD Cardiovascular: RRR no murmur Respiratory: CTAB normal WOB on RA Abdomen: soft NTND Extremities: no significant edema  Laboratory: Most recent CBC Lab Results  Component Value Date   WBC 6.0 06/16/2023   HGB  14.0 06/16/2023   HCT 41.9 06/16/2023   MCV 94.6 06/16/2023   PLT 250 06/16/2023   Most recent BMP    Latest Ref Rng & Units 06/16/2023    6:20 PM  BMP  Creatinine 0.61 - 1.24 mg/dL 5.28      Imaging/Diagnostic Tests:  Echo 4/17: IMPRESSIONS     1. Left ventricular ejection fraction, by estimation, is 45 to 50%. The  left ventricle has mildly decreased function. The left ventricle  demonstrates regional wall motion abnormalities (see scoring  diagram/findings for description). Left ventricular  diastolic parameters are consistent with Grade I diastolic dysfunction  (impaired relaxation).   2. Right ventricular systolic function is low normal. The right  ventricular size is normal.   3. The mitral valve is normal in structure. No evidence of mitral valve  regurgitation. No evidence of mitral stenosis.   4. The aortic valve is normal in structure. Aortic valve regurgitation is  mild. No aortic stenosis is present.   5. Aortic dilatation noted. There is mild dilatation of the ascending  aorta, measuring 43 mm. There is mild dilatation of the aortic root,  measuring 44 mm.   6. The inferior vena cava is normal in size with greater than 50%  respiratory variability, suggesting right atrial pressure of 3 mmHg.    Left Heart Cath 4/17:  Severe 3 vessel occlusive CAD.  Patent LIMA to the LAD but this only supplies the mid LAD segment. The LAD is occluded distally (CTO) Patent SVG to the third OM Patent SVG to the first OM. There is 80% stenosis in the proximal SVG.  This has progressed since 2022 Known occlusion of SVG to RCA Mildly elevated LVEDP  Edison Gore, MD 06/17/2023, 4:43 AM  PGY-2, [facility] FPTS Intern pager: [phone], text pages welcome Secure chat group [facility] Teaching Service

## 2023-06-17 NOTE — Evaluation (Signed)
 Occupational Therapy Evaluation Patient Details Name: Benjamin Espinoza MRN: [id number] DOB: February 06, 1966 Today's Date: 06/17/2023   History of Present Illness   58 y.o. male who presents with chest pain likely due to unstable angina s/p L heart cath and and angiography. PMH i CAD s/p CABG 2017 and 2021, PAD s/p R fem/pop bypass     Clinical Impressions Pt admitted for above, PTA pt was ind with ADLs and mod I using SPC for mobility. Pt currently ambulatory in room with CGA + SPC and completes ADLs with CGA to setup A. Pt negative for orthostatics but notes episodes of blurry vision and dizziness following light marching activity at bedside. OT to continue following pt acutely to ensure safe progression to baseline function prior to DC. No follow-up OT anticipated.      If plan is discharge home, recommend the following:   Assistance with cooking/housework     Functional Status Assessment   Patient has had a recent decline in their functional status and/or demonstrates limited ability to make significant improvements in function in a reasonable and predictable amount of time     Equipment Recommendations   None recommended by OT     Recommendations for Other Services         Precautions/Restrictions   Precautions Precautions: Fall Recall of Precautions/Restrictions: Intact Precaution/Restrictions Comments: Heart Cath 4/16 Restrictions Weight Bearing Restrictions Per Provider Order: No     Mobility Bed Mobility Overal bed mobility: Needs Assistance Bed Mobility: Supine to Sit, Sit to Supine     Supine to sit: Supervision Sit to supine: Supervision        Transfers Overall transfer level: Needs assistance Equipment used: Straight cane Transfers: Sit to/from Stand Sit to Stand: Contact guard assist                  Balance Overall balance assessment: Needs assistance Sitting-balance support: No upper extremity supported, Feet supported Sitting  balance-Leahy Scale: Good       Standing balance-Leahy Scale: Fair                             ADL either performed or assessed with clinical judgement   ADL Overall ADL's : Needs assistance/impaired Eating/Feeding: Independent;Sitting   Grooming: Standing;Contact guard assist   Upper Body Bathing: Set up;Sitting   Lower Body Bathing: Sitting/lateral leans;Set up   Upper Body Dressing : Sitting;Set up   Lower Body Dressing: Set up;Sitting/lateral leans   Toilet Transfer: Contact guard assist;Ambulation   Toileting- Clothing Manipulation and Hygiene: Contact guard assist;Sit to/from stand       Functional mobility during ADLs: Contact guard assist;Cane       Vision Patient Visual Report: No change from baseline       Perception         Praxis         Pertinent Vitals/Pain Pain Assessment Pain Assessment: Faces Faces Pain Scale: No hurt     Extremity/Trunk Assessment Upper Extremity Assessment Upper Extremity Assessment: Overall WFL for tasks assessed   Lower Extremity Assessment Lower Extremity Assessment: Defer to PT evaluation       Communication Communication Communication: No apparent difficulties   Cognition Arousal: Alert Behavior During Therapy: WFL for tasks assessed/performed Cognition: No apparent impairments                               Following commands:  Intact       Cueing  General Comments   Cueing Techniques: Verbal cues  Pt BP noted to be 129/96 in sitting and 120/94 with prolonged standing. No BP abnormalities noted.   Exercises Other Exercises Other Exercises: standing marching in place   Shoulder Instructions      Home Living Family/patient expects to be discharged to:: Shelter/Homeless                                        Prior Functioning/Environment               Mobility Comments: Mod I using SPC ADLs Comments: ind    OT Problem List: Impaired balance  (sitting and/or standing)   OT Treatment/Interventions: Self-care/ADL training;Visual/perceptual remediation/compensation;Therapeutic exercise;Patient/family education;Therapeutic activities;Balance training;DME and/or AE instruction      OT Goals(Current goals can be found in the care plan section)   Acute Rehab OT Goals Patient Stated Goal: To figure out what is going on OT Goal Formulation: With patient Time For Goal Achievement: 07/01/23 Potential to Achieve Goals: Good ADL Goals Pt Will Perform Grooming: with modified independence;standing Pt Will Perform Lower Body Dressing: with modified independence;sit to/from stand Pt Will Transfer to Toilet: with modified independence;ambulating Pt Will Perform Tub/Shower Transfer: Tub transfer;with modified independence   OT Frequency:  Min 2X/week    Co-evaluation              AM-PAC OT "6 Clicks" Daily Activity     Outcome Measure Help from another person eating meals?: None Help from another person taking care of personal grooming?: A Little Help from another person toileting, which includes using toliet, bedpan, or urinal?: A Little Help from another person bathing (including washing, rinsing, drying)?: A Little Help from another person to put on and taking off regular upper body clothing?: A Little Help from another person to put on and taking off regular lower body clothing?: A Little 6 Click Score: 19   End of Session Equipment Utilized During Treatment: Gait belt;Other (comment) [facility]) Nurse Communication: Mobility status  Activity Tolerance: Patient tolerated treatment well Patient left: in bed;with call bell/phone within reach  OT Visit Diagnosis: Unsteadiness on feet (R26.81)                Time: 1610-9604 OT Time Calculation (min): 17 min Charges:  OT General Charges $OT Visit: 1 Visit OT Evaluation $OT Eval Low Complexity: 1 Low  06/17/2023  AB, OTR/L  Acute Rehabilitation Services  Office:  [phone]   Jorene New 06/17/2023, 12:39 PM

## 2023-06-17 NOTE — Assessment & Plan Note (Signed)
 LHC on 4/17 showed severe three-vessel occlusive CAD among other findings.  Echo with EF 45-50%, grade 1 diastolic dysfunction. - Cardiology consulted, appreciate recs - continue ASA 81mg  daily  - Xarelto  2.5 mg twice daily - Imdur  60 mg daily - continue home coreg , Lipitor , ranolazine   - PT/OT - Appreciate TOC involvement in the setting of homelessness and cocaine use, both of which are contributing to his clinical picture

## 2023-06-17 NOTE — Progress Notes (Signed)
 Increased coreg  to 6.25 mg bid. Continue rest of the baseline anti anginal therapy. Reasonable to use low dose Xarelto  2.5 mg bid along with Aspirin  81 mg daily. Continue statin. Will arrange outpatient follow up.  Cody Das, MD

## 2023-06-17 NOTE — Progress Notes (Signed)
 Patient signed Against Medical Advice form at this time. IV removed by patient.  Gauze and tape dressing applied.  Left unit ambulatory with cane and belongings at this time.
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
## 2023-06-17 NOTE — Assessment & Plan Note (Signed)
 HTN: Continue home coreg , held losartan  HLD: continue home atorvastatin   PAD: continue home ASA, gabapentin  prn, held cilostazol 

## 2023-06-17 NOTE — TOC Transition Note (Signed)
 Transition of Care [facility]) - Discharge Note   Patient Details  Name: Benjamin Espinoza MRN: [id number] Date of Birth: April 17, 1965  [facility]) CM/SW Contact:  Valley Gavia, LCSWA Phone Number: 06/17/2023, 12:34 PM   Clinical Narrative:     CSW spoke with pt via phone, he requested shelter resources, provided on AVS.         Patient Goals and CMS Choice            Discharge Placement                       Discharge Plan and Services Additional resources added to the After Visit Summary for                                       Social Drivers of Health (SDOH) Interventions SDOH Screenings   Food Insecurity: Food Insecurity Present (06/16/2023)  Housing: High Risk (06/16/2023)  Transportation Needs: Unmet Transportation Needs (06/16/2023)  Utilities: Not At Risk (06/16/2023)  Alcohol Screen: Low Risk  (03/25/2023)  Depression (PHQ2-9): Low Risk  (06/09/2023)  Recent Concern: Depression (PHQ2-9) - Medium Risk (03/23/2023)  Financial Resource Strain: High Risk (06/09/2023)  Physical Activity: Inactive (03/25/2023)  Social Connections: Socially Integrated (06/16/2023)  Stress: Stress Concern Present (03/25/2023)  Tobacco Use: High Risk (06/15/2023)  Health Literacy: Adequate Health Literacy (03/25/2023)     Readmission Risk Interventions     No data to display

## 2023-06-20 ENCOUNTER — Telehealth: Payer: Self-pay

## 2023-06-20 DIAGNOSIS — Z5900 Homelessness unspecified: Secondary | ICD-10-CM

## 2023-06-20 DIAGNOSIS — G8929 Other chronic pain: Secondary | ICD-10-CM

## 2023-06-20 LAB — LIPOPROTEIN A (LPA): Lipoprotein (a): 194 nmol/L — ABNORMAL HIGH (ref <75.0)

## 2023-06-20 NOTE — Telephone Encounter (Signed)
 Copied from CRM [id number]. Topic: General - Other >> Jun 17, 2023  1:07 PM Lotus Round B wrote: Reason for CRM: pt would like to see if one of the nurses can give him a call . Pt just got out the hospital and he has a couple questions.  Sent in new referral for pt to have social worker for possible placement. KH

## 2023-06-21 NOTE — Congregational Nurse Program (Signed)
 Client to RN office for scheduled appointment. He recently was hospitalized and left against medical advice. RN plan to day to do thorough education on health education of disease processes of coronary artery disease with severe stenosis and educate client of modifiable risk factors including medication adherence, smoking cessation, polysubstance use cessation and stress management. Client invited to class at [facility] on self calming and coping mechanisms to help with his times of frustration and self numbing habits. Additionally, recommended client talk to PCP about nicotine  patches and looking into therapy to assist with forming better habits also. RN also shared spiritual care with client of deep breaths and more intentionality with scripture reading with meditative music to help him slow him down.  Appointment made for client to see his PCP tomorrow for after hospital visit, he will also call his Cardiologist Dr. Christean Courts to talk about 3 vessel disease and to see a vascular surgeon.   He has not yet picked up his medication and two bus passes were given for him to pick up medicine and then he will go to [employer] to get healthier options for food and RN will arrange ride home to hotel he is staying at to rest.

## 2023-06-22 ENCOUNTER — Inpatient Hospital Stay: Payer: Self-pay | Admitting: Nurse Practitioner

## 2023-06-28 ENCOUNTER — Telehealth: Payer: Self-pay | Admitting: *Deleted

## 2023-06-28 ENCOUNTER — Encounter: Payer: Self-pay | Admitting: *Deleted

## 2023-07-01 ENCOUNTER — Other Ambulatory Visit: Payer: Self-pay | Admitting: Nurse Practitioner

## 2023-07-01 ENCOUNTER — Telehealth: Payer: Self-pay

## 2023-07-01 MED ORDER — RIVAROXABAN 20 MG PO TABS: 20.0000 mg | ORAL_TABLET | Freq: Every day | ORAL | 0 refills | Status: DC

## 2023-07-01 MED ORDER — RANOLAZINE ER 1000 MG PO TB12: 1000.0000 mg | ORAL_TABLET | Freq: Two times a day (BID) | ORAL | 11 refills | Status: DC
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
## 2023-07-04 ENCOUNTER — Inpatient Hospital Stay: Payer: Self-pay | Admitting: Nurse Practitioner
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
## 2023-07-05 ENCOUNTER — Ambulatory Visit

## 2023-07-05 VITALS — BP 128/84 | HR 104 | Ht 72.0 in | Wt 203.4 lb

## 2023-07-05 DIAGNOSIS — F1410 Cocaine abuse, uncomplicated: Secondary | ICD-10-CM

## 2023-07-05 DIAGNOSIS — M5416 Radiculopathy, lumbar region: Secondary | ICD-10-CM

## 2023-07-05 DIAGNOSIS — I77810 Thoracic aortic ectasia: Secondary | ICD-10-CM | POA: Diagnosis not present

## 2023-07-05 DIAGNOSIS — I5022 Chronic systolic (congestive) heart failure: Secondary | ICD-10-CM

## 2023-07-05 DIAGNOSIS — I25708 Atherosclerosis of coronary artery bypass graft(s), unspecified, with other forms of angina pectoris: Secondary | ICD-10-CM

## 2023-07-05 DIAGNOSIS — I10 Essential (primary) hypertension: Secondary | ICD-10-CM

## 2023-07-05 DIAGNOSIS — E782 Mixed hyperlipidemia: Secondary | ICD-10-CM | POA: Diagnosis not present

## 2023-07-05 DIAGNOSIS — I739 Peripheral vascular disease, unspecified: Secondary | ICD-10-CM | POA: Diagnosis not present

## 2023-07-05 DIAGNOSIS — F17200 Nicotine dependence, unspecified, uncomplicated: Secondary | ICD-10-CM | POA: Diagnosis not present

## 2023-07-05 HISTORY — DX: Radiculopathy, lumbar region: M54.16

## 2023-07-05 MED ORDER — CARVEDILOL 6.25 MG PO TABS: 6.2500 mg | ORAL_TABLET | Freq: Two times a day (BID) | ORAL | 0 refills | Status: DC

## 2023-07-05 NOTE — Assessment & Plan Note (Signed)
 Consult to with entirely.

## 2023-07-05 NOTE — Assessment & Plan Note (Signed)
 Continue with atorvastatin  80 mg once daily. Lipid Panel     Component Value Date/Time   CHOL 224 (H) 06/16/2023 0405   TRIG 84 06/16/2023 0405   HDL 34 (L) 06/16/2023 0405   CHOLHDL 6.6 06/16/2023 0405   VLDL 17 06/16/2023 0405   LDLCALC 173 (H) 06/16/2023 0405

## 2023-07-05 NOTE — Assessment & Plan Note (Signed)
 Recommended to continue follow-up with vascular surgeon that he has established with her in [place].

## 2023-07-05 NOTE — Assessment & Plan Note (Signed)
 Euvolemic, compensated. Maitri class I. Mildly reduced LVEF 45 to 50% on echocardiogram 06/16/2023.  Continue salt and fluid restriction to below 2 g/day and below 2 L/day respectively.  Will further escalate guideline directed medical therapy depending on his adherence with medications and his tolerance. Continue with carvedilol  6.25 mg twice daily he is going to get a refill from the pharmacy for this. Further addition of ACE inhibitor/ARB/Entresto, aldosterone antagonist, SGLT2 inhibitors depending on his adherence, follow-up and cost coverage.

## 2023-07-05 NOTE — Assessment & Plan Note (Signed)
 Well-controlled at this time. Continue current medications however accurate med reconciliation is limited as discussed above.  Continue carvedilol  6.25 mg twice daily Imdur  60 mg once daily

## 2023-07-05 NOTE — Assessment & Plan Note (Signed)
 Congratulated on quitting cocaine and advised to stay abstinent from.

## 2023-07-05 NOTE — Assessment & Plan Note (Signed)
 S/p CABG in 2017 in [place]  S/p PCI of SVG-rPDA in 2021 that subsequently occluded on cath in 2022 Unstable angina s/p cardiac cath 06-16-2023 severe native vessel disease, patent LIMA-mid LAD and SVG-OM 3, patent SVG-OM1 with 80% stenosis, occluded SVG-RPDA.  Medical therapy recommended. LVEF 45 to 50% by TTE 06/16/2023  Appears to have stable angina symptoms with exertion no symptoms occurring only with moderate to heavy activity.  Relieved with rest.  Seems to have benefit with ranolazine  1000 mg twice daily.  Continue. Is been off carvedilol  for few days as he lost the medications. Resume carvedilol  6.25 mg twice daily. Unclear if he is taking Imdur  60 mg once daily that he was discharged on, he will confirm if he is taking it.  Previously reported headaches with this.  Continue with aspirin  81 mg once daily. Given his coronary artery disease anatomy was discharged on Xarelto  20 mg once daily.  He is unclear if he has been taking this.  Advised him to bring all his medications to the subsequent follow-up visit tentatively in 4 weeks.

## 2023-07-05 NOTE — Assessment & Plan Note (Signed)
 Echocardiogram 06/16/2023 with ascending aorta measuring 4.3 cm. Aortic root 4.4 cm. Considering his body surface area this is mildly dilated.  Will reassess with repeat echocardiogram tentatively in 1 year.

## 2023-07-05 NOTE — Patient Instructions (Addendum)
 Please bring all medications with you to your next appointment    Medication Instructions:  Your physician recommends that you continue on your current medications as directed. Please refer to the Current Medication list given to you today.  *If you need a refill on your cardiac medications before your next appointment, please call your pharmacy*   Lab Work: None Ordered If you have labs (blood work) drawn today and your tests are completely normal, you will receive your results only by: MyChart Message (if you have MyChart) OR A paper copy in the mail If you have any lab test that is abnormal or we need to change your treatment, we will call you to review the results.   Testing/Procedures: None Ordered   Follow-Up: At [facility], you and your health needs are our priority.  As part of our continuing mission to provide you with exceptional heart care, we have created designated Provider Care Teams.  These Care Teams include your primary Cardiologist (physician) and Advanced Practice Providers (APPs -  Physician Assistants and Nurse Practitioners) who all work together to provide you with the care you need, when you need it.  We recommend signing up for the patient portal called "MyChart".  Sign up information is provided on this After Visit Summary.  MyChart is used to connect with patients for Virtual Visits (Telemedicine).  Patients are able to view lab/test results, encounter notes, upcoming appointments, etc.  Non-urgent messages can be sent to your provider as well.   To learn more about what you can do with MyChart, go to [email or web address].    Your next appointment:   1 month follow up

## 2023-07-05 NOTE — Assessment & Plan Note (Signed)
>>  ASSESSMENT AND PLAN FOR CORONARY ARTERY DISEASE WRITTEN ON 07/05/2023  9:20 AM BY MADIREDDY, ALEAN SAUNDERS, MD  S/p CABG in 2017 in [place]  S/p PCI of SVG-rPDA in 2021 that subsequently occluded on cath in 2022 Unstable angina s/p cardiac cath 06-16-2023 severe native vessel disease, patent LIMA-mid LAD and SVG-OM 3, patent SVG-OM1 with 80% stenosis, occluded SVG-RPDA.  Medical therapy recommended. LVEF 45 to 50% by TTE 06/16/2023  Appears to have stable angina symptoms with exertion no symptoms occurring only with moderate to heavy activity.  Relieved with rest.  Seems to have benefit with ranolazine  1000 mg twice daily.  Continue. Is been off carvedilol  for few days as he lost the medications. Resume carvedilol  6.25 mg twice daily. Unclear if he is taking Imdur  60 mg once daily that he was discharged on, he will confirm if he is taking it.  Previously reported headaches with this.  Continue with aspirin  81 mg once daily. Given his coronary artery disease anatomy was discharged on Xarelto  20 mg once daily.  He is unclear if he has been taking this.  Advised him to bring all his medications to the subsequent follow-up visit tentatively in 4 weeks.

## 2023-07-05 NOTE — Progress Notes (Signed)
 Cardiology Consultation:    Date:  07/05/2023   ID:  Benjamin Espinoza Espinoza, DOB [birth year], MRN [id number]  PCP:  Patient, No Pcp Per  Cardiologist:  Benjamin Espinoza Kells, MD   Referring MD: Benjamin Morel, NP   No chief complaint on file.    ASSESSMENT AND PLAN:   Benjamin Espinoza Espinoza a 58 year old male history of CAD s/p CABG in 2017 in [place] , s/p PCI of SVG-rPDA in 2021 that subsequently occluded in 2022 and recently unstable angina s/p cath 06/16/2023 [severe native vessel disease, patent LIMA-mid LAD, distal LAD occluded CTO, patent SVG-OM 3, patent SVG-OM1 with 80% stenosis, occluded SVG-RPDA; elevated LVEDP 19 mmHg and was recommended medical therapy in the setting of poor outflow from the patent grafts into the native vessels and concern about adherence to therapy and ongoing polysubstance abuse], peripheral arterial disease s/p redo right femoropopliteal bypass, CHF with mildly reduced LVEF 45 to 50% by TTE 06/16/2023, mild aortic insufficiency, ascending aorta mildly dilated 4.3 cm by TTE 06/16/2023, hypertension, hyperlipidemia, prediabetes, polyp substance abuse [smoked crack cocaine now quit: Smokes marijuana daily; smokes tobacco], CKD stage II/III.  Overall doing well since hospital discharge however there is discrepancy about his current medication use. Symptoms of dyspnea on exertion and chest heaviness with moderate to heavy activities.  Problem List Items Addressed This Visit     Essential hypertension   Well-controlled at this time. Continue current medications however accurate med reconciliation is limited as discussed above.  Continue carvedilol  6.25 mg twice daily Imdur  60 mg once daily       Relevant Medications   losartan  (COZAAR ) 50 MG tablet   carvedilol  (COREG ) 6.25 MG tablet   Hyperlipidemia   Continue with atorvastatin  80 mg once daily. Lipid Panel     Component Value Date/Time   CHOL 224 (H) 06/16/2023 0405   TRIG 84 06/16/2023 0405   HDL 34 (L) 06/16/2023  0405   CHOLHDL 6.6 06/16/2023 0405   VLDL 17 06/16/2023 0405   LDLCALC 173 (H) 06/16/2023 0405         Relevant Medications   losartan  (COZAAR ) 50 MG tablet   carvedilol  (COREG ) 6.25 MG tablet   Tobacco use disorder   Consult to with entirely.      Coronary artery disease - Primary   S/p CABG in 2017 in [place]  S/p PCI of SVG-rPDA in 2021 that subsequently occluded on cath in 2022 Unstable angina s/p cardiac cath 06-16-2023 severe native vessel disease, patent LIMA-mid LAD and SVG-OM 3, patent SVG-OM1 with 80% stenosis, occluded SVG-RPDA.  Medical therapy recommended. LVEF 45 to 50% by TTE 06/16/2023  Appears to have stable angina symptoms with exertion no symptoms occurring only with moderate to heavy activity.  Relieved with rest.  Seems to have benefit with ranolazine  1000 mg twice daily.  Continue. Is been off carvedilol  for few days as he lost the medications. Resume carvedilol  6.25 mg twice daily. Unclear if he is taking Imdur  60 mg once daily that he was discharged on, he will confirm if he is taking it.  Previously reported headaches with this.  Continue with aspirin  81 mg once daily. Given his coronary artery disease anatomy was discharged on Xarelto  20 mg once daily.  He is unclear if he has been taking this.  Advised him to bring all his medications to the subsequent follow-up visit tentatively in 4 weeks.      Relevant Medications   losartan  (COZAAR ) 50 MG tablet   carvedilol  (COREG ) 6.25 MG tablet  PAD (peripheral artery disease) (HCC)   Recommended to continue follow-up with vascular surgeon that he has established with her in [place].       Relevant Medications   losartan  (COZAAR ) 50 MG tablet   carvedilol  (COREG ) 6.25 MG tablet   Cocaine abuse (HCC)   Congratulated on quitting cocaine and advised to stay abstinent from.      Dilation of thoracic aorta (HCC)   Echocardiogram 06/16/2023 with ascending aorta measuring 4.3 cm. Aortic root 4.4  cm. Considering his body surface area this is mildly dilated.  Will reassess with repeat echocardiogram tentatively in 1 year.      Relevant Medications   losartan  (COZAAR ) 50 MG tablet   carvedilol  (COREG ) 6.25 MG tablet   Heart failure with mid-range ejection fraction (HFmEF) (HCC)   Euvolemic, compensated. Maitri class I. Mildly reduced LVEF 45 to 50% on echocardiogram 06/16/2023.  Continue salt and fluid restriction to below 2 g/day and below 2 L/day respectively.  Will further escalate guideline directed medical therapy depending on his adherence with medications and his tolerance. Continue with carvedilol  6.25 mg twice daily he is going to get a refill from the pharmacy for this. Further addition of ACE inhibitor/ARB/Entresto, aldosterone antagonist, SGLT2 inhibitors depending on his adherence, follow-up and cost coverage.       Relevant Medications   losartan  (COZAAR ) 50 MG tablet   carvedilol  (COREG ) 6.25 MG tablet   Return to neck in 1 month   History of Present Illness:    Benjamin Espinoza Espinoza is a 58 y.o. male who is being seen today for follow-up after recent discharge from the hospital at [facility] in the setting of unstable angina. PCP is Benjamin Morel, NP.  Pleasant man here for the visit by himself.  Mentions currently living at [facility] [place].  Until recently he had been homeless.  History of CAD s/p CABG in 2017 in [place] , s/p PCI of SVG-rPDA in 2021 that subsequently occluded in 2022 and recently unstable angina s/p cath 06/16/2023 [severe native vessel disease, patent LIMA-mid LAD, distal LAD occluded CTO, patent SVG-OM 3, patent SVG-OM1 with 80% stenosis, occluded SVG-RCA; elevated LVEDP 19 mmHg and was recommended medical therapy in the setting of poor outflow from the patent grafts into the native vessels and concern about adherence to therapy and ongoing polysubstance abuse], peripheral arterial disease s/p redo right femoropopliteal bypass, CHF  with mildly reduced LVEF 45 to 50% by TTE 06/16/2023, mild aortic insufficiency, ascending aorta mildly dilated 4.3 cm by TTE 06/16/2023, hypertension, hyperlipidemia, prediabetes, polyp substance abuse, nicotine  dependence, CKD stage II/III  Recently in the setting of being off medications for over 3 days due to lack of access on June 15, 2023 he was admitted at [facility] for unstable angina, with flat high-sensitivity troponins [30, 30], cardiology evaluated and echocardiogram noted LVEF 45 to 50% with regional wall motion abnormalities, cardiac catheterization with severe occlusive native vessel disease and known occluded SVG-RPDA, demonstrated patent LIMA-LAD with distal LAD portion chronically occluded, there is 80% stenosis of the SVG graft to OM1 but with reduced flow in the distal native vessels and SVG to to OM 3 was patent.  Overall given his multiple medical and social issues and severe native vessel disease, decided to proceed with escalating medical therapy and no intervention was pursued.  Mentions he has been taking medications as prescribed but there is discrepancy in the discharge summary listed and what he mentions he has been taking. He mentions he  has been continuing to take cilostazol .  He was unclear whether he is actively taking Xarelto .  Mentions carvedilol  medication has been lost and he has not been on it for 5 days.  Also notes Imdur  gives him headache and he had stopped it in the past but unsure if he is actively taking it at this time.  However at this time he mentions to be doing well.  Denies any chest pain, shortness of breath. Able to walk and do his day-to-day activities without limitations.  When he does do moderate to heavy exertion is when he feels short of breath that relieves immediately with rest. He has been exercising and mentions doing calisthenics.  Mentions he has quit using cocaine [was smoking crack cocaine previously] since his hospital  admission. Smokes marijuana. Does not consume alcohol [mentions last drink was many months ago]. Smoking 1 pack of cigarettes in about 3 days.  Working on quitting.  Denies any blood in urine or stools. Denies any claudication symptoms.   Past Medical History:  Diagnosis Date   Abnormal stress test    Acute left ankle pain 03/23/2023   Anxiety    Asthma    as a child   Atypical chest pain 05/07/2017   Chest pain  Unstable angina 06/15/2023   Chronic health problem 06/15/2023   Chronic pain syndrome 03/29/2023   Cocaine abuse (HCC) 06/16/2023   Coronary artery disease    DDD (degenerative disc disease), lumbar 11/08/2019   Formatting of this note might be different from the original. 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 There is mild dilatation of the ascending aorta, measuring 43 mm. There is mild dilatation of the aortic root, measuring 44 mm.      Encounter for medication monitoring 01/10/2023   Encounter for well adult exam with abnormal findings 04/08/2023   Essential hypertension    Heart failure with mid-range ejection fraction (HFmEF) (HCC) 06/17/2023   06/16/23 transthoracic echocardiogram: Left Ventricle: Left ventricular ejection fraction 45 to 50%. The left ventricle has mildly decreased function. No left ventricular hypertrophy. 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 [place]    Hyperlipidemia    Impaired mobility and ADLs 03/23/2023   Ischemic cardiomyopathy 12/17/2020   Lumbar radiculopathy 07/05/2023   Myocardial infarction [facility]) 2017   NSTEMI (non-ST elevated myocardial infarction) (HCC) 11/28/2020   OAB (overactive bladder) 01/11/2023   PAD (peripheral artery disease) (HCC) 09/21/2022   PVD (peripheral vascular disease) (HCC) 09/21/2019   Right leg pain 03/23/2023   Shortness of breath    Sinus bradycardia 11/29/2019   Tobacco use disorder     Unhoused person 06/16/2023   Unstable angina (HCC) 06/15/2023    Past Surgical History:  Procedure Laterality Date   ABDOMINAL AORTOGRAM W/LOWER EXTREMITY Bilateral 09/21/2019   Procedure: ABDOMINAL AORTOGRAM W/LOWER EXTREMITY;  Surgeon: Dannis Dy, MD;  Location: [facility] INVASIVE CV LAB;  Service: Cardiovascular;  Laterality: Bilateral;   ABDOMINAL AORTOGRAM W/LOWER EXTREMITY N/A 12/07/2019   Procedure: ABDOMINAL AORTOGRAM W/LOWER EXTREMITY;  Surgeon: Dannis Dy, MD;  Location: [facility] INVASIVE CV LAB;  Service: Cardiovascular;  Laterality: N/A;   ABDOMINAL AORTOGRAM W/LOWER EXTREMITY N/A 09/09/2022   Procedure: ABDOMINAL AORTOGRAM W/LOWER EXTREMITY;  Surgeon: Young Hensen, MD;  Location: [facility] INVASIVE CV LAB;  Service: Vascular;  Laterality: N/A;   CARDIAC CATHETERIZATION     CORONARY ARTERY BYPASS GRAFT  quadruple bypas 2017   CORONARY STENT INTERVENTION N/A 11/26/2019   Procedure: CORONARY STENT INTERVENTION;  Surgeon: Sammy Crisp, MD;  Location: [facility] INVASIVE CV LAB;  Service: Cardiovascular;  Laterality: N/A;   FEMORAL-POPLITEAL BYPASS GRAFT Right 12/25/2019   Procedure: BYPASS GRAFT FEMORAL- BELOW KNEE POPLITEAL ARTERY RIGHT USING GORE PROPATEN VASCULAR GRAFT;  Surgeon: Dannis Dy, MD;  Location: [facility] OR;  Service: Vascular;  Laterality: Right;   FEMORAL-POPLITEAL BYPASS GRAFT Right 09/21/2022   Procedure: REDO RIGHT LOWER EXTREMITY BYPASS USING GORE PROPATEN 6mm REMOVALBLE RING GRAFT;  Surgeon: Dannis Dy, MD;  Location: [facility] OR;  Service: Vascular;  Laterality: Right;   INTRAOPERATIVE ARTERIOGRAM Right 09/21/2022   Procedure: INTRA OPERATIVE ARTERIOGRAM;  Surgeon: Dannis Dy, MD;  Location: [facility] OR;  Service: Vascular;  Laterality: Right;   LEFT HEART CATH AND CORS/GRAFTS ANGIOGRAPHY N/A 11/26/2019   Procedure: LEFT HEART CATH AND CORS/GRAFTS ANGIOGRAPHY;  Surgeon: Sammy Crisp, MD;  Location: [facility] INVASIVE CV LAB;  Service:  Cardiovascular;  Laterality: N/A;   LEFT HEART CATH AND CORS/GRAFTS ANGIOGRAPHY N/A 12/01/2020   Procedure: LEFT HEART CATH AND CORS/GRAFTS ANGIOGRAPHY;  Surgeon: Millicent Ally, MD;  Location: [facility] INVASIVE CV LAB;  Service: Cardiovascular;  Laterality: N/A;   LEFT HEART CATH AND CORS/GRAFTS ANGIOGRAPHY N/A 06/16/2023   Procedure: LEFT HEART CATH AND CORS/GRAFTS ANGIOGRAPHY;  Surgeon: Swaziland, Peter M, MD;  Location: [facility] INVASIVE CV LAB;  Service: Cardiovascular;  Laterality: N/A;   LOWER EXTREMITY ANGIOGRAM Right 12/25/2019   Procedure: RIGHT LOWER EXTREMITY ANGIOGRAM;  Surgeon: Dannis Dy, MD;  Location: [facility] OR;  Service: Vascular;  Laterality: Right;   PERIPHERAL VASCULAR BALLOON ANGIOPLASTY Left 12/07/2019   Procedure: PERIPHERAL VASCULAR BALLOON ANGIOPLASTY;  Surgeon: Dannis Dy, MD;  Location: [facility] INVASIVE CV LAB;  Service: Cardiovascular;  Laterality: Left;  SFA    Current Medications: Current Meds  Medication Sig   aspirin  EC 81 MG tablet Take 1 tablet (81 mg total) by mouth daily.   atorvastatin  (LIPITOR ) 80 MG tablet Take 1 tablet (80 mg total) by mouth daily.   cilostazol  (PLETAL ) 100 MG tablet Take 100 mg by mouth 2 (two) times daily.   gabapentin  (NEURONTIN ) 300 MG capsule Take 300 mg by mouth 3 (three) times daily.   isosorbide  mononitrate (IMDUR ) 60 MG 24 hr tablet Take 1 tablet (60 mg total) by mouth daily.   lidocaine  (LIDODERM ) 5 % Place 1 patch onto the skin daily. Remove & Discard patch within 12 hours or as directed by MD   losartan  (COZAAR ) 50 MG tablet Take 50 mg by mouth daily.   ranolazine  (RANEXA ) 1000 MG SR tablet Take 1 tablet (1,000 mg total) by mouth 2 (two) times daily.   rivaroxaban  (XARELTO ) 20 MG TABS tablet Take 1 tablet (20 mg total) by mouth daily with supper.   solifenacin  (VESICARE ) 5 MG tablet Take 5 mg by mouth daily.   [DISCONTINUED] carvedilol  (COREG ) 6.25 MG tablet Take 1 tablet (6.25 mg total) by mouth 2 (two) times daily with a  meal.     Allergies:   Isosorbide    Social History   Socioeconomic History   Marital status: Married    Spouse name: Not on file   Number of children: Not on file   Years of education: Not on file   Highest education level: Not on file  Occupational History   Not on file  Tobacco Use   Smoking status: Every Day    Types: Cigars, Cigarettes  Passive exposure: Current (Wife is a smoker)   Smokeless tobacco: Never   Tobacco comments:    as of 12/20/19 2 cigararettes/day  Vaping Use   Vaping status: Never Used  Substance and Sexual Activity   Alcohol use: No   Drug use: Yes    Types: Marijuana   Sexual activity: Not Currently  Other Topics Concern   Not on file  Social History Narrative   Lives with alone but has a caregiver   Social Drivers of Corporate investment banker Strain: High Risk (06/09/2023)   Overall Financial Resource Strain (CARDIA)    Difficulty of Paying Living Expenses: Very hard  Food Insecurity: Food Insecurity Present (06/16/2023)   Hunger Vital Sign    Worried About Running Out of Food in the Last Year: Often true    Ran Out of Food in the Last Year: Often true  Transportation Needs: Unmet Transportation Needs (06/16/2023)   PRAPARE - Transportation    Lack of Transportation (Medical): Yes    Lack of Transportation (Non-Medical): Yes  Physical Activity: Inactive (03/25/2023)   Exercise Vital Sign    Days of Exercise per Week: 0 days    Minutes of Exercise per Session: 0 min  Stress: Stress Concern Present (03/25/2023)   [employer] of Occupational Health - Occupational Stress Questionnaire    Feeling of Stress : Rather much  Social Connections: Socially Integrated (06/16/2023)   Social Connection and Isolation Panel [NHANES]    Frequency of Communication with Friends and Family: More than three times a week    Frequency of Social Gatherings with Friends and Family: Never    Attends Religious Services: More than 4 times per year    Active  Member of [employer] or Organizations: Yes    Attends Banker Meetings: Never    Marital Status: Married     Family History: The patient's family history includes Heart disease in his father, mother, and sister. ROS:   Please see the history of present illness.    All 14 point review of systems negative except as described per history of present illness.  EKGs/Labs/Other Studies Reviewed:    The following studies were reviewed today:   EKG:       Recent Labs: 09/21/2022: ALT 30 06/17/2023: BUN 10; Creatinine, Ser 1.30; Hemoglobin 14.6; Platelets 248; Potassium 4.0; Sodium 137  Recent Lipid Panel    Component Value Date/Time   CHOL 224 (H) 06/16/2023 0405   TRIG 84 06/16/2023 0405   HDL 34 (L) 06/16/2023 0405   CHOLHDL 6.6 06/16/2023 0405   VLDL 17 06/16/2023 0405   LDLCALC 173 (H) 06/16/2023 0405    Physical Exam:    VS:  BP 128/84   Pulse (!) 104   Ht 6' (1.829 m)   Wt 203 lb 6.4 oz (92.3 kg)   SpO2 98%   BMI 27.59 kg/m     Wt Readings from Last 3 Encounters:  07/05/23 203 lb 6.4 oz (92.3 kg)  06/16/23 190 lb 8 oz (86.4 kg)  06/08/23 198 lb 12.8 oz (90.2 kg)     GENERAL:  Well nourished, well developed in no acute distress NECK: No JVD; No carotid bruits CARDIAC: RRR, S1 and S2 present, no murmurs, no rubs, no gallops CHEST:  Clear to auscultation without rales, wheezing or rhonchi  Extremities: No pitting pedal edema. Pulses bilaterally symmetric with radial 2+ and dorsalis pedis 2+ NEUROLOGIC:  Alert and oriented x 3  Medication Adjustments/Labs and Tests Ordered: Current  medicines are reviewed at length with the patient today.  Concerns regarding medicines are outlined above.  No orders of the defined types were placed in this encounter.  Meds ordered this encounter  Medications   carvedilol  (COREG ) 6.25 MG tablet    Sig: Take 1 tablet (6.25 mg total) by mouth 2 (two) times daily with a meal.    Dispense:  60 tablet    Refill:  0     Signed, Kurtis Anastasia reddy Zalen Sequeira, MD, MPH, [facility]. 07/05/2023 9:24 AM    [facility]

## 2023-07-07 ENCOUNTER — Other Ambulatory Visit: Payer: Self-pay

## 2023-07-07 NOTE — Congregational Nurse Program (Signed)
 Client to RN office. He is out of his carvedilol  medication, RN  made client aware he has a prescription ready from cardiology at pharmacy in [place], he was given bus passes and is going to pick meds up as he has a shelter he is staying at there. No further concerns at this time.

## 2023-07-11 ENCOUNTER — Ambulatory Visit: Payer: Medicare HMO | Admitting: Internal Medicine

## 2023-07-11 NOTE — Progress Notes (Deleted)
 Cardiology Clinic Note   Patient Name: Benjamin Espinoza Date of Encounter: 07/11/2023  Primary Care Provider:  Patient, No Pcp Per Primary Cardiologist:  None  Patient Profile    Benjamin Espinoza 58 year old male presents to the clinic today for follow-up evaluation of his coronary artery disease and CHF.  Past Medical History    Past Medical History:  Diagnosis Date   Abnormal stress test    Acute left ankle pain 03/23/2023   Anxiety    Asthma    as a child   Atypical chest pain 05/07/2017   Chest pain  Unstable angina 06/15/2023   Chronic health problem 06/15/2023   Chronic pain syndrome 03/29/2023   Cocaine abuse (HCC) 06/16/2023   Coronary artery disease    DDD (degenerative disc disease), lumbar 11/08/2019   Formatting of this note might be different from the original. 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 There is mild dilatation of the ascending aorta, measuring 43 mm. There is mild dilatation of the aortic root, measuring 44 mm.      Encounter for medication monitoring 01/10/2023   Encounter for well adult exam with abnormal findings 04/08/2023   Essential hypertension    Heart failure with mid-range ejection fraction (HFmEF) (HCC) 06/17/2023   06/16/23 transthoracic echocardiogram: Left Ventricle: Left ventricular ejection fraction 45 to 50%. The left ventricle has mildly decreased function. No left ventricular hypertrophy. 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 [place]    Hyperlipidemia    Impaired mobility and ADLs 03/23/2023   Ischemic cardiomyopathy 12/17/2020   Lumbar radiculopathy 07/05/2023   Myocardial infarction [facility]) 2017   NSTEMI (non-ST elevated myocardial infarction) (HCC) 11/28/2020   OAB (overactive bladder) 01/11/2023   PAD (peripheral artery disease) (HCC) 09/21/2022   PVD (peripheral vascular disease) (HCC) 09/21/2019    Right leg pain 03/23/2023   Shortness of breath    Sinus bradycardia 11/29/2019   Tobacco use disorder    Unhoused person 06/16/2023   Unstable angina (HCC) 06/15/2023   Past Surgical History:  Procedure Laterality Date   ABDOMINAL AORTOGRAM W/LOWER EXTREMITY Bilateral 09/21/2019   Procedure: ABDOMINAL AORTOGRAM W/LOWER EXTREMITY;  Surgeon: Dannis Dy, MD;  Location: [facility] INVASIVE CV LAB;  Service: Cardiovascular;  Laterality: Bilateral;   ABDOMINAL AORTOGRAM W/LOWER EXTREMITY N/A 12/07/2019   Procedure: ABDOMINAL AORTOGRAM W/LOWER EXTREMITY;  Surgeon: Dannis Dy, MD;  Location: [facility] INVASIVE CV LAB;  Service: Cardiovascular;  Laterality: N/A;   ABDOMINAL AORTOGRAM W/LOWER EXTREMITY N/A 09/09/2022   Procedure: ABDOMINAL AORTOGRAM W/LOWER EXTREMITY;  Surgeon: Young Hensen, MD;  Location: [facility] INVASIVE CV LAB;  Service: Vascular;  Laterality: N/A;   CARDIAC CATHETERIZATION     CORONARY ARTERY BYPASS GRAFT     quadruple bypas 2017   CORONARY STENT INTERVENTION N/A 11/26/2019   Procedure: CORONARY STENT INTERVENTION;  Surgeon: Sammy Crisp, MD;  Location: [facility] INVASIVE CV LAB;  Service: Cardiovascular;  Laterality: N/A;   FEMORAL-POPLITEAL BYPASS GRAFT Right 12/25/2019   Procedure: BYPASS GRAFT FEMORAL- BELOW KNEE POPLITEAL ARTERY RIGHT USING GORE PROPATEN VASCULAR GRAFT;  Surgeon: Dannis Dy, MD;  Location: [facility] OR;  Service: Vascular;  Laterality: Right;   FEMORAL-POPLITEAL BYPASS GRAFT Right 09/21/2022   Procedure: REDO RIGHT LOWER EXTREMITY BYPASS USING GORE PROPATEN 6mm REMOVALBLE RING GRAFT;  Surgeon: Dannis Dy, MD;  Location: [facility] OR;  Service: Vascular;  Laterality:  Right;   INTRAOPERATIVE ARTERIOGRAM Right 09/21/2022   Procedure: INTRA OPERATIVE ARTERIOGRAM;  Surgeon: Dannis Dy, MD;  Location: [facility] OR;  Service: Vascular;  Laterality: Right;   LEFT HEART CATH AND CORS/GRAFTS ANGIOGRAPHY N/A 11/26/2019   Procedure: LEFT HEART  CATH AND CORS/GRAFTS ANGIOGRAPHY;  Surgeon: Sammy Crisp, MD;  Location: [facility] INVASIVE CV LAB;  Service: Cardiovascular;  Laterality: N/A;   LEFT HEART CATH AND CORS/GRAFTS ANGIOGRAPHY N/A 12/01/2020   Procedure: LEFT HEART CATH AND CORS/GRAFTS ANGIOGRAPHY;  Surgeon: Millicent Ally, MD;  Location: [facility] INVASIVE CV LAB;  Service: Cardiovascular;  Laterality: N/A;   LEFT HEART CATH AND CORS/GRAFTS ANGIOGRAPHY N/A 06/16/2023   Procedure: LEFT HEART CATH AND CORS/GRAFTS ANGIOGRAPHY;  Surgeon: Swaziland, Peter M, MD;  Location: [facility] INVASIVE CV LAB;  Service: Cardiovascular;  Laterality: N/A;   LOWER EXTREMITY ANGIOGRAM Right 12/25/2019   Procedure: RIGHT LOWER EXTREMITY ANGIOGRAM;  Surgeon: Dannis Dy, MD;  Location: [facility] OR;  Service: Vascular;  Laterality: Right;   PERIPHERAL VASCULAR BALLOON ANGIOPLASTY Left 12/07/2019   Procedure: PERIPHERAL VASCULAR BALLOON ANGIOPLASTY;  Surgeon: Dannis Dy, MD;  Location: [facility] INVASIVE CV LAB;  Service: Cardiovascular;  Laterality: Left;  SFA    Allergies  Allergies  Allergen Reactions   Isosorbide      Other Reaction(s): Other (See Comments)  Severe headache    History of Present Illness    Benjamin Espinoza has a PMH of coronary artery disease status post CABG in 2017.  This was performed in [place] .  He is status post PCI of his SVG-RPDA in 2021 and subsequently this was occluded in 2022.  He had unstable angina and underwent cardiac catheterization 06/16/2023.  He was noted to have severe native CAD, patent LIMA-mid LAD, distal LAD occluded CTO, patent SVG-OM 3, patent SVG-OM1 with 80% stenosis, occluded SVG-RPDA medical management was recommended in the setting of poor outflow from patent grafts into native vessels and concern for adherence to therapy and ongoing polysubstance abuse.  His PMH also includes peripheral artery disease status post redo right femoropopliteal bypass, CHF with mildly reduced LVEF 45-50% 06/16/2023, mild aortic  insufficiency, ascending aortic aneurysm measuring 4.3 cm, HTN, HLD, prediabetes, polysubstance use, and tobacco use.  He also smokes marijuana daily.  He has CKD stage II/III.  He was seen in follow-up by Dr. Ronell Coe on 07/05/2023.  During that time he was noted to have discrepancy in his medication use.  He reported dyspnea on exertion and chest heaviness with moderate to heavy activities.  He was instructed to bring his medications to his appointment.  It was felt that he was benefiting from Ranexa  1000 mg twice daily.  He reported that he lost his carvedilol  and had been off of it for about 5 days.  He was resumed on carvedilol  6.25 mg twice daily.  He was unclear whether he was taking Imdur  or not.  He was also discharged on Xarelto  20 mg once daily.  He was not sure if he was taking this.  He noted that he had not used cocaine.  Echocardiogram in 1 year for reevaluation of his aortic thoracic dilation was planned.  Follow-up was planned for 1 month.  He presents to the clinic today for follow-up evaluation and states***.  *** denies chest pain, shortness of breath, lower extremity edema, fatigue, palpitations, melena, hematuria, hemoptysis, diaphoresis, weakness, presyncope, syncope, orthopnea, and PND.  HFmrEF-no increased DOE or activity intolerance.  EF noted to be 45-50% on echocardiogram 06/16/2023.  Reports that he  is compliant with his medications.  Brings in medications today. Continue carvedilol , losartan , Imdur  Heart healthy low-sodium diet Increase physical activity as tolerated Daily weights Elevate lower extremities when not active  Essential hypertension-BP today***. Maintain blood pressure log Continue carvedilol , losartan   Coronary artery disease-no chest pain today.  Denies exertional chest discomfort.  Underwent CABG in 2017 in [place] .  Had subsequent PCI 2021 and 2022 in the setting of unstable angina.  Underwent LHC 06/16/2023 and was noted to have severe native  multivessel disease, patent LIMA-mid LAD and SVG-OM 3, patent SVG-OM1 with 80% stenosis and occluded SVG-RPDA.  Medical management was recommended. Continue aspirin , atorvastatin , carvedilol , Imdur , losartan , Ranexa   Peripheral vascular disease-denies lower extremity claudication.  He is status post redo right femoropopliteal bypass. Follow-up with [facility]  Dilation of thoracic aorta-echocardiogram showed 4.3 cm ascending aortic aneurysm on 06/16/2023.  Aortic root was noted to be 4.4 cm. Plan for repeat echocardiogram 4/26.  Disposition: Follow-up with Dr. Ronell Coe or me in 4 to 6 months.   Home Medications    Prior to Admission medications   Medication Sig Start Date End Date Taking? Authorizing Provider  aspirin  EC 81 MG tablet Take 1 tablet (81 mg total) by mouth daily. 09/23/22   Rhyne, Samantha J, PA-C  atorvastatin  (LIPITOR ) 80 MG tablet Take 1 tablet (80 mg total) by mouth daily. 06/08/23   Jerrlyn Morel, NP  carvedilol  (COREG ) 6.25 MG tablet Take 1 tablet (6.25 mg total) by mouth 2 (two) times daily with a meal. 07/05/23   Madireddy, Daymon Evans, MD  cilostazol  (PLETAL ) 100 MG tablet Take 100 mg by mouth 2 (two) times daily. 07/02/23   [provider]  gabapentin  (NEURONTIN ) 300 MG capsule Take 300 mg by mouth 3 (three) times daily. 04/25/23   [provider]  isosorbide  mononitrate (IMDUR ) 60 MG 24 hr tablet Take 1 tablet (60 mg total) by mouth daily. 06/18/23   Ivin Marrow, MD  lidocaine  (LIDODERM ) 5 % Place 1 patch onto the skin daily. Remove & Discard patch within 12 hours or as directed by MD 05/04/23   Lucina Sabal, PA-C  losartan  (COZAAR ) 50 MG tablet Take 50 mg by mouth daily. 07/02/23   [provider]  ranolazine  (RANEXA ) 1000 MG SR tablet Take 1 tablet (1,000 mg total) by mouth 2 (two) times daily. 07/01/23   Jerrlyn Morel, NP  rivaroxaban  (XARELTO ) 20 MG TABS tablet Take 1 tablet (20 mg total) by mouth daily with supper. 07/01/23   Jerrlyn Morel, NP   solifenacin  (VESICARE ) 5 MG tablet Take 5 mg by mouth daily. 06/02/23   [provider]    Family History    Family History  Problem Relation Age of Onset   Heart disease Mother    Heart disease Father    Heart disease Sister    He indicated that the status of his mother is unknown. He indicated that the status of his father is unknown. He indicated that the status of his sister is unknown.  Social History    Social History   Socioeconomic History   Marital status: Married    Spouse name: Not on file   Number of children: Not on file   Years of education: Not on file   Highest education level: Not on file  Occupational History   Not on file  Tobacco Use   Smoking status: Every Day    Types: Cigars, Cigarettes    Passive exposure: Current (Wife is a smoker)  Smokeless tobacco: Never   Tobacco comments:    as of 12/20/19 2 cigararettes/day  Vaping Use   Vaping status: Never Used  Substance and Sexual Activity   Alcohol use: No   Drug use: Yes    Types: Marijuana   Sexual activity: Not Currently  Other Topics Concern   Not on file  Social History Narrative   Lives with alone but has a caregiver   Social Drivers of Corporate investment banker Strain: High Risk (06/09/2023)   Overall Financial Resource Strain (CARDIA)    Difficulty of Paying Living Expenses: Very hard  Food Insecurity: Food Insecurity Present (06/16/2023)   Hunger Vital Sign    Worried About Running Out of Food in the Last Year: Often true    Ran Out of Food in the Last Year: Often true  Transportation Needs: Unmet Transportation Needs (06/16/2023)   PRAPARE - Transportation    Lack of Transportation (Medical): Yes    Lack of Transportation (Non-Medical): Yes  Physical Activity: Inactive (03/25/2023)   Exercise Vital Sign    Days of Exercise per Week: 0 days    Minutes of Exercise per Session: 0 min  Stress: Stress Concern Present (03/25/2023)   [employer] of Occupational Health -  Occupational Stress Questionnaire    Feeling of Stress : Rather much  Social Connections: Socially Integrated (06/16/2023)   Social Connection and Isolation Panel [NHANES]    Frequency of Communication with Friends and Family: More than three times a week    Frequency of Social Gatherings with Friends and Family: Never    Attends Religious Services: More than 4 times per year    Active Member of [employer] or Organizations: Yes    Attends Banker Meetings: Never    Marital Status: Married  Catering manager Violence: Not At Risk (06/16/2023)   Humiliation, Afraid, Rape, and Kick questionnaire    Fear of Current or Ex-Partner: No    Emotionally Abused: No    Physically Abused: No    Sexually Abused: No     Review of Systems    General:  No chills, fever, night sweats or weight changes.  Cardiovascular:  No chest pain, dyspnea on exertion, edema, orthopnea, palpitations, paroxysmal nocturnal dyspnea. Dermatological: No rash, lesions/masses Respiratory: No cough, dyspnea Urologic: No hematuria, dysuria Abdominal:   No nausea, vomiting, diarrhea, bright red blood per rectum, melena, or hematemesis Neurologic:  No visual changes, wkns, changes in mental status. All other systems reviewed and are otherwise negative except as noted above.  Physical Exam    VS:  There were no vitals taken for this visit. , BMI There is no height or weight on file to calculate BMI. GEN: Well nourished, well developed, in no acute distress. HEENT: normal. Neck: Supple, no JVD, carotid bruits, or masses. Cardiac: RRR, no murmurs, rubs, or gallops. No clubbing, cyanosis, edema.  Radials/DP/PT 2+ and equal bilaterally.  Respiratory:  Respirations regular and unlabored, clear to auscultation bilaterally. GI: Soft, nontender, nondistended, BS + x 4. MS: no deformity or atrophy. Skin: warm and dry, no rash. Neuro:  Strength and sensation are intact. Psych: Normal affect.  Accessory Clinical Findings     Recent Labs: 09/21/2022: ALT 30 06/17/2023: BUN 10; Creatinine, Ser 1.30; Hemoglobin 14.6; Platelets 248; Potassium 4.0; Sodium 137   Recent Lipid Panel    Component Value Date/Time   CHOL 224 (H) 06/16/2023 0405   TRIG 84 06/16/2023 0405   HDL 34 (L) 06/16/2023 0405   CHOLHDL  6.6 06/16/2023 0405   VLDL 17 06/16/2023 0405   LDLCALC 173 (H) 06/16/2023 0405    No BP recorded.  {Refresh Note OR Click here to enter BP  :1}***    ECG personally reviewed by me today- ***     Echocardiogram 06/16/2023  IMPRESSIONS     1. Left ventricular ejection fraction, by estimation, is 45 to 50%. The  left ventricle has mildly decreased function. The left ventricle  demonstrates regional wall motion abnormalities (see scoring  diagram/findings for description). Left ventricular  diastolic parameters are consistent with Grade I diastolic dysfunction  (impaired relaxation).   2. Right ventricular systolic function is low normal. The right  ventricular size is normal.   3. The mitral valve is normal in structure. No evidence of mitral valve  regurgitation. No evidence of mitral stenosis.   4. The aortic valve is normal in structure. Aortic valve regurgitation is  mild. No aortic stenosis is present.   5. Aortic dilatation noted. There is mild dilatation of the ascending  aorta, measuring 43 mm. There is mild dilatation of the aortic root,  measuring 44 mm.   6. The inferior vena cava is normal in size with greater than 50%  respiratory variability, suggesting right atrial pressure of 3 mmHg.   FINDINGS   Left Ventricle: Left ventricular ejection fraction, by estimation, is 45  to 50%. The left ventricle has mildly decreased function. The left  ventricle demonstrates regional wall motion abnormalities. The left  ventricular internal cavity size was normal  in size. There is no left ventricular hypertrophy. Abnormal (paradoxical)  septal motion consistent with post-operative status. Left  ventricular  diastolic parameters are consistent with Grade I diastolic dysfunction  (impaired relaxation).     LV Wall Scoring:  The inferior wall is akinetic.   Right Ventricle: The right ventricular size is normal. No increase in  right ventricular wall thickness. Right ventricular systolic function is  low normal.   Left Atrium: Left atrial size was normal in size.   Right Atrium: Right atrial size was normal in size.   Pericardium: There is no evidence of pericardial effusion.   Mitral Valve: The mitral valve is normal in structure. No evidence of  mitral valve regurgitation. No evidence of mitral valve stenosis.   Tricuspid Valve: The tricuspid valve is normal in structure. Tricuspid  valve regurgitation is not demonstrated. No evidence of tricuspid  stenosis.   Aortic Valve: The aortic valve is normal in structure. Aortic valve  regurgitation is mild. No aortic stenosis is present. Aortic valve mean  gradient measures 4.0 mmHg. Aortic valve peak gradient measures 5.5 mmHg.  Aortic valve area, by VTI measures 3.65  cm.   Pulmonic Valve: The pulmonic valve was normal in structure. Pulmonic valve  regurgitation is mild. No evidence of pulmonic stenosis.   Aorta: Aortic dilatation noted. There is mild dilatation of the ascending  aorta, measuring 43 mm. There is mild dilatation of the aortic root,  measuring 44 mm.   Venous: The inferior vena cava is normal in size with greater than 50%  respiratory variability, suggesting right atrial pressure of 3 mmHg.   IAS/Shunts: No atrial level shunt detected by color flow Doppler.    Cardiac catheterization 06/16/2023     Prox RCA lesion is 90% stenosed.   Prox RCA to Dist RCA lesion is 100% stenosed.   Prox Cx to Mid Cx lesion is 100% stenosed.   Ost LM to Mid LM lesion is 50% stenosed.  Ost LAD to Mid LAD lesion is 100% stenosed.   Dist LAD-1 lesion is 100% stenosed.   Dist LAD-2 lesion is 85% stenosed.   Prox Graft  lesion is 20% stenosed.   1st Mrg-1 lesion is 95% stenosed.   1st Mrg-2 lesion is 95% stenosed.   1st Mrg-3 lesion is 95% stenosed.   1st Mrg-4 lesion is 90% stenosed.   Origin to Prox Graft lesion is 80% stenosed.   Origin to Prox Graft lesion is 100% stenosed.   SVG.   LV end diastolic pressure is mildly elevated.   Severe 3 vessel occlusive CAD.  Patent LIMA to the LAD but this only supplies the mid LAD segment. The LAD is occluded distally (CTO) Patent SVG to the third OM Patent SVG to the first OM. There is 80% stenosis in the proximal SVG. This has progressed since 2022 Known occlusion of SVG to RCA Mildly elevated LVEDP   Plan: recommend resuming guideline directed medical therapy. Patient needs counseling on cessation of tobacco and cocaine abuse. While PCI of the SVG to OM1 could be considered I think this would actually be detrimental given ongoing polysubstance abuse and noncompliance with medical therapy. There is also poor outflow of this graft due to severe disease in the native vessel so long term patency of a stent would be poor.  Diagnostic Dominance: Right  Intervention   Assessment & Plan   1.  ***   Chet Cota. Lizandro Spellman NP-C     07/11/2023, 12:36 PM [facility] [address] Office [phone] Fax [phone]    I spent***minutes examining this patient, reviewing medications, and using patient centered shared decision making involving their cardiac care.   I spent  20 minutes reviewing past medical history,  medications, and prior cardiac tests.

## 2023-07-13 ENCOUNTER — Ambulatory Visit: Attending: General Practice | Admitting: General Practice

## 2023-07-14 ENCOUNTER — Encounter: Payer: Self-pay | Admitting: General Practice

## 2023-07-18 ENCOUNTER — Telehealth: Payer: Self-pay

## 2023-07-18 NOTE — Telephone Encounter (Signed)
 Pt c/o of Chest Pain: STAT if active (IN THIS MOMENT) CP, including tightness, pressure, jaw pain, shoulder/upper arm/back pain, SOB, nausea, and vomiting.  1. Are you having CP right now (tightness, pressure, or discomfort)? No. Raven, RN called in stating pt keep having angina for about 2 hrs after working out.   2. Are you experiencing any other symptoms (ex. SOB, nausea, vomiting, sweating)? She states during the time he has cp, he will have numbness in his right arm  3. How long have you been experiencing CP? 2 weeks   4. Is your CP continuous or coming and going? Coming and going   5. Have you taken Nitroglycerin ? No, but he has isosorbide  but ran out, pt needs refill.   6. If CP returns before callback, please consider calling 911. ?

## 2023-07-18 NOTE — Congregational Nurse Program (Signed)
 Client to RN office. He states he is doing well and feeling better. He has been taking medicine and started exercising. Client states however after exercise he is beginning to have delayed unstable angina two hours after working out. He states he is mainly doing push ups and pull ups. He describes the pain as pressure that he has to rush and lay down for about 20-30 minutes before it improves and sometimes taking an extra blood pressure pill to help. He also states that his whole right arm goes numb during this time, "like when you fall asleep on it." He states this has been happening everyday for two weeks after physical activity. He does not have any nitroglycerin .  RN advised client to stop all physical activity until he gets cardiac clearance and assessed clients vitals. BP is elevated and client states he is missing his isosorbide  mononitrate. He is having no symptoms at this time. RN called cardiology office to make them aware of symptoms and missing medicine and VM left for follow up. RN provided patient education on importance of smoking cessation, and reducing modifiable risk factors. RN emphasized the risk of ischemia and importance of exercise restrictions that are not physically taxing on clients cardiovascular system and to wait to hear from cardiologist before more exercise and to go to the ED if symptoms continue. RN will continue follow up with client.

## 2023-07-19 MED ORDER — ISOSORBIDE MONONITRATE ER 60 MG PO TB24: 60.0000 mg | ORAL_TABLET | Freq: Every day | ORAL | 3 refills | Status: DC

## 2023-07-19 NOTE — Telephone Encounter (Signed)
 Recommendations reviewed with Benjamin Espinoza as per Dr. Madireddy's note. Benjamin Espinoza verbalized understanding and had no additional questions.

## 2023-07-20 NOTE — Congregational Nurse Program (Signed)
 Client message RN, he states that he went to the pharmacy but medicine wasn't there. RN checked MAR and his prescription was sent to [facility] instead of [facility] as he requested. RN updated client and he will pick up medication today. RN will continue supporting client as necessary.

## 2023-07-21 ENCOUNTER — Telehealth: Payer: Self-pay

## 2023-07-21 NOTE — Telephone Encounter (Signed)
 Client called to inform RN that he was able to get his Isosorbide  Mononitrate from pharmacy after being without medication for about one month.  He is thankful and appreciative of the help. Client has no further concerns at this time.

## 2023-08-05 ENCOUNTER — Ambulatory Visit

## 2023-08-12 ENCOUNTER — Telehealth: Payer: Self-pay | Admitting: *Deleted

## 2023-08-12 NOTE — Patient Instructions (Signed)
 Visit Information  Thank you for taking time to visit with me today. Please don't hesitate to contact me if I can be of assistance to you before our next scheduled appointment. Patient has met all care management goals. Care Management case will be closed. Patient has been provided contact information should new needs arise.   Please call the care guide team at [phone] if you need to cancel, schedule, or reschedule an appointment.   Please call 911 if you are experiencing a Mental Health or Behavioral Health Crisis or need someone to talk to.  Chivas Notz, LCSW [place]  [facility] Licensed Clinical Social Worker  Direct Dial: [phone]

## 2023-08-12 NOTE — Patient Outreach (Signed)
 Complex Care Management   Visit Note  08/12/2023  Name:  Benjamin Espinoza MRN: [id number] DOB: 17-Apr-1965  Situation: Referral received for Complex Care Management related to SDOH Barriers:  Housing Patient confirms that he is now residing at the [facility] in [place]. I obtained verbal consent from Patient.  Visit completed with patient  on the phone  Background:   Past Medical History:  Diagnosis Date   Abnormal stress test    Acute left ankle pain 03/23/2023   Anxiety    Asthma    as a child   Atypical chest pain 05/07/2017   Chest pain  Unstable angina 06/15/2023   Chronic health problem 06/15/2023   Chronic pain syndrome 03/29/2023   Cocaine abuse (HCC) 06/16/2023   Coronary artery disease    DDD (degenerative disc disease), lumbar 11/08/2019   Formatting of this note might be different from the original. 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 There is mild dilatation of the ascending aorta, measuring 43 mm. There is mild dilatation of the aortic root, measuring 44 mm.      Encounter for medication monitoring 01/10/2023   Encounter for well adult exam with abnormal findings 04/08/2023   Essential hypertension    Heart failure with mid-range ejection fraction (HFmEF) (HCC) 06/17/2023   06/16/23 transthoracic echocardiogram: Left Ventricle: Left ventricular ejection fraction 45 to 50%. The left ventricle has mildly decreased function. No left ventricular hypertrophy. 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 [place]    Hyperlipidemia    Impaired mobility and ADLs 03/23/2023   Ischemic cardiomyopathy 12/17/2020   Lumbar radiculopathy 07/05/2023   Myocardial infarction [facility]) 2017   NSTEMI (non-ST elevated myocardial infarction) (HCC) 11/28/2020   OAB (overactive bladder) 01/11/2023   PAD (peripheral artery disease) (HCC) 09/21/2022   PVD (peripheral  vascular disease) (HCC) 09/21/2019   Right leg pain 03/23/2023   Shortness of breath    Sinus bradycardia 11/29/2019   Tobacco use disorder    Unhoused person 06/16/2023   Unstable angina (HCC) 06/15/2023    Assessment: Patient Reported Symptoms:  Cognitive Cognitive Status: Alert and oriented to person, place, and time, Insightful and able to interpret abstract concepts Cognitive/Intellectual Conditions Management [RPT]: None reported or documented in medical history or problem list      Neurological Neurological Review of Symptoms: Not assessed    HEENT HEENT Symptoms Reported: No symptoms reported      Cardiovascular Cardiovascular Symptoms Reported: Chest pain or discomfort Does patient have uncontrolled Hypertension?: No Cardiovascular Conditions: Hypertension, Heart failure Cardiovascular Management Strategies: Medication therapy Cardiovascular Comment: followed by [facility] in [place]-has a follow up appointment 08/17/23  Respiratory Respiratory Symptoms Reported: Shortness of breath Other Respiratory Symptoms: cannot walk long distances or walking up a incline causes shortness of breath Respiratory Conditions: Shortness of breath  Endocrine Patient reports the following symptoms related to hypoglycemia or hyperglycemia : No symptoms reported Is patient diabetic?: No    Gastrointestinal Gastrointestinal Symptoms Reported: Not assessed      Genitourinary Genitourinary Symptoms Reported: No symptoms reported    Integumentary Integumentary Symptoms Reported: Skin changes Additional Integumentary Details: mosquito bites-currently healing    Musculoskeletal Musculoskelatal Symptoms Reviewed: Difficulty walking, Weakness, Muscle pain Additional Musculoskeletal Details: 2 stents in right leg, diffiuclty walking up incline Musculoskeletal Conditions: Back pain, Mobility limited, Joint pain, Rheumatoid arthritis Musculoskeletal Management Strategies: Medication  therapy  Falls in the past year?: Yes Number of falls in past year: 1 or less Was there an injury with Fall?: Yes Fall Risk Category Calculator: 2 Patient Fall Risk Level: Moderate Fall Risk Patient at Risk for Falls Due to: Impaired mobility, Impaired balance/gait Fall risk Follow up: Falls prevention discussed  Psychosocial Psychosocial Symptoms Reported: No symptoms reported Other Psychosocial Conditions: Per patient, he is [facility] [place] has a case worker at the shelter who is assisting transition to independent living . Case manager also working on establishing patient with a PCP in [place]. Patient also receiving support through [facility].            06/09/2023    3:05 PM  Depression screen PHQ 2/9  Decreased Interest 0  Down, Depressed, Hopeless 0  PHQ - 2 Score 0    There were no vitals filed for this visit.  Medications Reviewed Today     Reviewed by Ave Leisure, LCSW (Social Worker) on 08/12/23 at 1307  Med List Status: <None>   Medication Order Taking? Sig Documenting Provider Last Dose Status Informant  aspirin  EC 81 MG tablet [id number] Yes Take 1 tablet (81 mg total) by mouth daily. Rhyne, Samantha J, PA-C  Active Self  atorvastatin  (LIPITOR ) 80 MG tablet [id number] Yes Take 1 tablet (80 mg total) by mouth daily. Jerrlyn Morel, NP  Active Self  carvedilol  (COREG ) 6.25 MG tablet [id number] Yes Take 1 tablet (6.25 mg total) by mouth 2 (two) times daily with a meal. Madireddy, Daymon Evans, MD  Active   cilostazol  (PLETAL ) 100 MG tablet [id number] Yes Take 100 mg by mouth 2 (two) times daily. [provider]  Active   gabapentin  (NEURONTIN ) 300 MG capsule [id number] Yes Take 300 mg by mouth 3 (three) times daily. [provider]  Active Self  isosorbide  mononitrate (IMDUR ) 60 MG 24 hr tablet [id number]  Take 1 tablet (60 mg total) by mouth daily. Madireddy, Daymon Evans, MD  Active   lidocaine  (LIDODERM )  5 % [id number]  Place 1 patch onto the skin daily. Remove & Discard patch within 12 hours or as directed by MD Lucina Sabal, PA-C  Active Self  losartan  (COZAAR ) 50 MG tablet [id number] Yes Take 50 mg by mouth daily. [provider]  Active   ranolazine  (RANEXA ) 1000 MG SR tablet [id number] Yes Take 1 tablet (1,000 mg total) by mouth 2 (two) times daily. Jerrlyn Morel, NP  Active   rivaroxaban  (XARELTO ) 20 MG TABS tablet [id number] Yes Take 1 tablet (20 mg total) by mouth daily with supper. Jerrlyn Morel, NP  Active   solifenacin  (VESICARE ) 5 MG tablet [id number] Yes Take 5 mg by mouth daily. [provider]  Active Self            Recommendation:   PCP Follow-up-call to schedule initial appointment tio re-establish care from list provided Specialty provider follow-up Cardiologist 08/17/23 Continue to check status of [facility] housing and continue with plan to apply for [facility] as well  Follow Up Plan:   Patient has met all care management goals. Care Management case will be closed. Patient has been provided contact information should new needs arise.   Kasson Lamere, LCSW [place]  [facility] Licensed Clinical Social Worker  Direct Dial: [phone]

## 2023-08-15 ENCOUNTER — Other Ambulatory Visit: Payer: Self-pay

## 2023-08-18 ENCOUNTER — Ambulatory Visit

## 2023-08-18 VITALS — BP 118/84 | HR 80 | Ht 72.0 in | Wt 204.6 lb

## 2023-08-18 DIAGNOSIS — I5022 Chronic systolic (congestive) heart failure: Secondary | ICD-10-CM

## 2023-08-18 DIAGNOSIS — I25708 Atherosclerosis of coronary artery bypass graft(s), unspecified, with other forms of angina pectoris: Secondary | ICD-10-CM | POA: Diagnosis not present

## 2023-08-18 DIAGNOSIS — I10 Essential (primary) hypertension: Secondary | ICD-10-CM | POA: Diagnosis not present

## 2023-08-18 MED ORDER — CARVEDILOL 6.25 MG PO TABS: 6.2500 mg | ORAL_TABLET | Freq: Two times a day (BID) | ORAL | 3 refills | Status: DC

## 2023-08-18 MED ORDER — ISOSORBIDE MONONITRATE ER 120 MG PO TB24: 120.0000 mg | ORAL_TABLET | Freq: Every day | ORAL | 3 refills | Status: DC

## 2023-08-18 MED ORDER — ATORVASTATIN CALCIUM 80 MG PO TABS: 80.0000 mg | ORAL_TABLET | Freq: Every day | ORAL | 3 refills | Status: DC

## 2023-08-18 MED ORDER — NITROGLYCERIN 0.4 MG SL SUBL: 0.4000 mg | SUBLINGUAL_TABLET | SUBLINGUAL | 1 refills | Status: DC | PRN

## 2023-08-18 NOTE — Assessment & Plan Note (Signed)
 S/p CABG in 2017 in [place]  S/p PCI of SVG-rPDA in 2021 that subsequently occluded on cath in 2022 Unstable angina s/p cardiac cath 06-16-2023 severe native vessel disease, patent LIMA-mid LAD and SVG-OM 3, patent SVG-OM1 with 80% stenosis, occluded SVG-RPDA.  Medical therapy recommended. LVEF 45 to 50% by TTE 06/16/2023  Continue with aspirin  81 mg once daily. Resume atorvastatin  80 mg once daily.   From angina control standpoint continue with carvedilol  6.25 mg twice daily Continue ranolazine  1000 mg twice daily. Continue Imdur , increase the dose to 120 mg once daily Will also prescribe sublingual nitroglycerin  to use as needed.  Continue Xarelto  20 mg once daily given his underlying CAD history. Continue with aspirin  81 mg once daily.

## 2023-08-18 NOTE — Assessment & Plan Note (Signed)
>>  ASSESSMENT AND PLAN FOR CORONARY ARTERY DISEASE WRITTEN ON 08/18/2023 10:54 AM BY MADIREDDY, ALEAN SAUNDERS, MD  S/p CABG in 2017 in [place]  S/p PCI of SVG-rPDA in 2021 that subsequently occluded on cath in 2022 Unstable angina s/p cardiac cath 06-16-2023 severe native vessel disease, patent LIMA-mid LAD and SVG-OM 3, patent SVG-OM1 with 80% stenosis, occluded SVG-RPDA.  Medical therapy recommended. LVEF 45 to 50% by TTE 06/16/2023  Continue with aspirin  81 mg once daily. Resume atorvastatin  80 mg once daily.   From angina control standpoint continue with carvedilol  6.25 mg twice daily Continue ranolazine  1000 mg twice daily. Continue Imdur , increase the dose to 120 mg once daily Will also prescribe sublingual nitroglycerin  to use as needed.  Continue Xarelto  20 mg once daily given his underlying CAD history. Continue with aspirin  81 mg once daily.

## 2023-08-18 NOTE — Assessment & Plan Note (Signed)
 Uncontrolled with systolic 150/diastolic 100 mmHg today in the office on recheck. Continue carvedilol  6.25 mg twice daily Increase Imdur  to 120 mg once daily. He was recently started on losartan  by PCP however he appears to not been taking it as it is not in the medications that he brought to the office today.  Advised him to continue the above medications, will revisit his blood pressure and angina control and follow-up visit at 2 weeks

## 2023-08-18 NOTE — Progress Notes (Signed)
 Cardiology Consultation:    Date:  08/18/2023   ID:  Benjamin Espinoza, DOB 1965-06-17, MRN [id number]  PCP:  Patient, No Pcp Per  Cardiologist:  Daymon Evans Helmer Dull, MD   Referring MD: No ref. provider found   No chief complaint on file.    ASSESSMENT AND PLAN:   Mr. Brandenburg 58 year old male patient with history of CAD s/p CABG in 2017 in [place] , s/p PCI of SVG-RPDA in 2021 with subsequently occluded in 2022,   recently unstable angina s/p cath 06/16/2023 [severe native vessel disease, patent LIMA-mid LAD, distal LAD occluded CTO, patent SVG-OM 3, patent SVG-OM1 with 80% stenosis, occluded SVG-RPDA; elevated LVEDP 19 mmHg and was recommended medical therapy in the setting of poor outflow from the patent grafts into the native vessels and concern about adherence to therapy and ongoing polysubstance abuse], peripheral arterial disease s/p redo right femoropopliteal bypass, CHF with mildly reduced LVEF 45 to 50% by TTE 06/16/2023, mild aortic insufficiency, ascending aorta mildly dilated 4.3 cm by TTE 06/16/2023, hypertension, hyperlipidemia, prediabetes, polyp substance abuse [smoked crack cocaine now quit: Smokes marijuana daily; smokes tobacco], CKD stage II/III.     Here for follow-up with ongoing symptoms of shortness of breath on exertion suggestive of angina.  Problem List Items Addressed This Visit     Essential hypertension   Uncontrolled with systolic 150/diastolic 100 mmHg today in the office on recheck. Continue carvedilol  6.25 mg twice daily Increase Imdur  to 120 mg once daily. He was recently started on losartan  by PCP however he appears to not been taking it as it is not in the medications that he brought to the office today.  Advised him to continue the above medications, will revisit his blood pressure and angina control and follow-up visit at 2 weeks      Relevant Medications   isosorbide  mononitrate (IMDUR ) 120 MG 24 hr tablet   nitroGLYCERIN  (NITROSTAT ) 0.4 MG SL  tablet   atorvastatin  (LIPITOR ) 80 MG tablet   carvedilol  (COREG ) 6.25 MG tablet   Coronary artery disease - Primary   S/p CABG in 2017 in [place]  S/p PCI of SVG-rPDA in 2021 that subsequently occluded on cath in 2022 Unstable angina s/p cardiac cath 06-16-2023 severe native vessel disease, patent LIMA-mid LAD and SVG-OM 3, patent SVG-OM1 with 80% stenosis, occluded SVG-RPDA.  Medical therapy recommended. LVEF 45 to 50% by TTE 06/16/2023  Continue with aspirin  81 mg once daily. Resume atorvastatin  80 mg once daily.   From angina control standpoint continue with carvedilol  6.25 mg twice daily Continue ranolazine  1000 mg twice daily. Continue Imdur , increase the dose to 120 mg once daily Will also prescribe sublingual nitroglycerin  to use as needed.  Continue Xarelto  20 mg once daily given his underlying CAD history. Continue with aspirin  81 mg once daily.       Relevant Medications   isosorbide  mononitrate (IMDUR ) 120 MG 24 hr tablet   nitroGLYCERIN  (NITROSTAT ) 0.4 MG SL tablet   atorvastatin  (LIPITOR ) 80 MG tablet   carvedilol  (COREG ) 6.25 MG tablet   Heart failure with mid-range ejection fraction (HFmEF) (HCC)   Euvolemic in complaints.  NYHA class I. Mildly reduced LVEF 45 to 50% on echocardiogram June 16, 2023. Continue with salt and fluid restriction, he mentions salt restriction has been difficult given his status of living at the shelter and limited food options.  Mentions he does have access to food stamps and will purchase a low-salt diets. Continue carvedilol . Losartan  was started by PCP however he does not  appear to be taking it right now. Will follow-up in 2 weeks and will resume losartan  and consider other medications spironolactone and SGLT2 inhibitors based on blood work      Relevant Medications   isosorbide  mononitrate (IMDUR ) 120 MG 24 hr tablet   nitroGLYCERIN  (NITROSTAT ) 0.4 MG SL tablet   atorvastatin  (LIPITOR ) 80 MG tablet   carvedilol  (COREG ) 6.25 MG  tablet      History of Present Illness:    Benjamin Espinoza is a 58 y.o. male who is being seen today fo for follow-up visit. Last visit with me in the office was 07/05/2023.  Had history of CAD s/p CABG in 2017 in [place] , s/p PCI of SVG-RPDA in 2021 with subsequently occluded in 2022,   recently unstable angina s/p cath 06/16/2023 [severe native vessel disease, patent LIMA-mid LAD, distal LAD occluded CTO, patent SVG-OM 3, patent SVG-OM1 with 80% stenosis, occluded SVG-RPDA; elevated LVEDP 19 mmHg and was recommended medical therapy in the setting of poor outflow from the patent grafts into the native vessels and concern about adherence to therapy and ongoing polysubstance abuse], peripheral arterial disease s/p redo right femoropopliteal bypass, CHF with mildly reduced LVEF 45 to 50% by TTE 06/16/2023, mild aortic insufficiency, ascending aorta mildly dilated 4.3 cm by TTE 06/16/2023, hypertension, hyperlipidemia, prediabetes, polyp substance abuse [smoked crack cocaine now quit: Smokes marijuana daily; smokes tobacco], CKD stage II/III.    Continues to live at [facility].  Keeps up with activities at the shelter.  Here for a follow-up visit today mentioning he has been having symptoms of shortness of breath with exertion and taking multiple doses of isosorbide . With this he is out of isosorbide  yesterday.  He ran out of carvedilol  couple days ago and just refilled the medication.  Denies any symptoms at rest. Mentions symptoms that occur with exertion subside after he sits down to rest after few minutes.  Feels taking the isosorbide  helps relieve the symptoms sooner.  Blood pressures using his wrist cuff have been elevated with systolic in 140s and diastolic in 90s to 100s.  Quit using crack cocaine and last used about 2 months ago. Continues to smoke cigarettes but is in the process of quitting. Smokes marijuana occasionally but has not been using recently. quit alcohol many  months ago.    Past Medical History:  Diagnosis Date   Abnormal stress test    Acute left ankle pain 03/23/2023   Anxiety    Asthma    as a child   Atypical chest pain 05/07/2017   Chest pain  Unstable angina 06/15/2023   Chronic bilateral low back pain with left-sided sciatica 01/11/2023   Chronic health problem 06/15/2023   Chronic pain syndrome 03/29/2023   Cocaine abuse (HCC) 06/16/2023   Coronary artery disease    DDD (degenerative disc disease), lumbar 11/08/2019   Formatting of this note might be different from the original. 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 There is mild dilatation of the ascending aorta, measuring 43 mm. There is mild dilatation of the aortic root, measuring 44 mm.      Encounter for medication monitoring 01/10/2023   Encounter for well adult exam with abnormal findings 04/08/2023   Essential hypertension    Heart failure with mid-range ejection fraction (HFmEF) (HCC) 06/17/2023   06/16/23 transthoracic echocardiogram: Left Ventricle: Left ventricular ejection fraction 45 to 50%. The left ventricle has mildly decreased function. No  left ventricular hypertrophy. 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 [place]    Hyperlipidemia    Impaired mobility and ADLs 03/23/2023   Ischemic cardiomyopathy 12/17/2020   Lumbar radiculopathy 07/05/2023   Myocardial infarction [facility]) 2017   NSTEMI (non-ST elevated myocardial infarction) (HCC) 11/28/2020   OAB (overactive bladder) 01/11/2023   PAD (peripheral artery disease) (HCC) 09/21/2022   PVD (peripheral vascular disease) (HCC) 09/21/2019   Right leg pain 03/23/2023   Shortness of breath    Sinus bradycardia 11/29/2019   Tobacco use disorder    Unhoused person 06/16/2023   Unstable angina (HCC) 06/15/2023    Past Surgical History:  Procedure Laterality Date   ABDOMINAL AORTOGRAM W/LOWER  EXTREMITY Bilateral 09/21/2019   Procedure: ABDOMINAL AORTOGRAM W/LOWER EXTREMITY;  Surgeon: Dannis Dy, MD;  Location: [facility] INVASIVE CV LAB;  Service: Cardiovascular;  Laterality: Bilateral;   ABDOMINAL AORTOGRAM W/LOWER EXTREMITY N/A 12/07/2019   Procedure: ABDOMINAL AORTOGRAM W/LOWER EXTREMITY;  Surgeon: Dannis Dy, MD;  Location: [facility] INVASIVE CV LAB;  Service: Cardiovascular;  Laterality: N/A;   ABDOMINAL AORTOGRAM W/LOWER EXTREMITY N/A 09/09/2022   Procedure: ABDOMINAL AORTOGRAM W/LOWER EXTREMITY;  Surgeon: Young Hensen, MD;  Location: [facility] INVASIVE CV LAB;  Service: Vascular;  Laterality: N/A;   CARDIAC CATHETERIZATION     CORONARY ARTERY BYPASS GRAFT     quadruple bypas 2017   CORONARY STENT INTERVENTION N/A 11/26/2019   Procedure: CORONARY STENT INTERVENTION;  Surgeon: Sammy Crisp, MD;  Location: [facility] INVASIVE CV LAB;  Service: Cardiovascular;  Laterality: N/A;   FEMORAL-POPLITEAL BYPASS GRAFT Right 12/25/2019   Procedure: BYPASS GRAFT FEMORAL- BELOW KNEE POPLITEAL ARTERY RIGHT USING GORE PROPATEN VASCULAR GRAFT;  Surgeon: Dannis Dy, MD;  Location: [facility] OR;  Service: Vascular;  Laterality: Right;   FEMORAL-POPLITEAL BYPASS GRAFT Right 09/21/2022   Procedure: REDO RIGHT LOWER EXTREMITY BYPASS USING GORE PROPATEN 6mm REMOVALBLE RING GRAFT;  Surgeon: Dannis Dy, MD;  Location: [facility] OR;  Service: Vascular;  Laterality: Right;   INTRAOPERATIVE ARTERIOGRAM Right 09/21/2022   Procedure: INTRA OPERATIVE ARTERIOGRAM;  Surgeon: Dannis Dy, MD;  Location: [facility] OR;  Service: Vascular;  Laterality: Right;   LEFT HEART CATH AND CORS/GRAFTS ANGIOGRAPHY N/A 11/26/2019   Procedure: LEFT HEART CATH AND CORS/GRAFTS ANGIOGRAPHY;  Surgeon: Sammy Crisp, MD;  Location: [facility] INVASIVE CV LAB;  Service: Cardiovascular;  Laterality: N/A;   LEFT HEART CATH AND CORS/GRAFTS ANGIOGRAPHY N/A 12/01/2020   Procedure: LEFT HEART CATH AND CORS/GRAFTS ANGIOGRAPHY;   Surgeon: Millicent Ally, MD;  Location: [facility] INVASIVE CV LAB;  Service: Cardiovascular;  Laterality: N/A;   LEFT HEART CATH AND CORS/GRAFTS ANGIOGRAPHY N/A 06/16/2023   Procedure: LEFT HEART CATH AND CORS/GRAFTS ANGIOGRAPHY;  Surgeon: Swazil[place], Peter M, MD;  Location: [facility] INVASIVE CV LAB;  Service: Cardiovascular;  Laterality: N/A;   LOWER EXTREMITY ANGIOGRAM Right 12/25/2019   Procedure: RIGHT LOWER EXTREMITY ANGIOGRAM;  Surgeon: Dannis Dy, MD;  Location: [facility] OR;  Service: Vascular;  Laterality: Right;   PERIPHERAL VASCULAR BALLOON ANGIOPLASTY Left 12/07/2019   Procedure: PERIPHERAL VASCULAR BALLOON ANGIOPLASTY;  Surgeon: Dannis Dy, MD;  Location: [facility] INVASIVE CV LAB;  Service: Cardiovascular;  Laterality: Left;  SFA    Current Medications: Current Meds  Medication Sig   gabapentin  (NEURONTIN ) 300 MG capsule Take 300 mg by mouth 3 (three) times daily.   isosorbide  mononitrate (IMDUR ) 120 MG 24 hr tablet Take 1 tablet (120 mg total) by mouth daily.  nitroGLYCERIN  (NITROSTAT ) 0.4 MG SL tablet Place 1 tablet (0.4 mg total) under the tongue every 5 (five) minutes as needed for chest pain.   ranolazine  (RANEXA ) 1000 MG SR tablet Take 1 tablet (1,000 mg total) by mouth 2 (two) times daily.   rivaroxaban  (XARELTO ) 20 MG TABS tablet Take 1 tablet (20 mg total) by mouth daily with supper.   [DISCONTINUED] carvedilol  (COREG ) 6.25 MG tablet TAKE 1 TABLET BY MOUTH 2 TIMES DAILY WITH A MEAL.   [DISCONTINUED] isosorbide  mononitrate (IMDUR ) 60 MG 24 hr tablet Take 1 tablet (60 mg total) by mouth daily.     Allergies:   Isosorbide    Social History   Socioeconomic History   Marital status: Married    Spouse name: Not on file   Number of children: Not on file   Years of education: Not on file   Highest education level: Not on file  Occupational History   Not on file  Tobacco Use   Smoking status: Every Day    Types: Cigars, Cigarettes    Passive exposure: Current (Wife is a  smoker)   Smokeless tobacco: Never   Tobacco comments:    as of 12/20/19 2 cigararettes/day  Vaping Use   Vaping status: Never Used  Substance and Sexual Activity   Alcohol use: No   Drug use: Yes    Types: Marijuana   Sexual activity: Not Currently  Other Topics Concern   Not on file  Social History Narrative   Lives with alone but has a caregiver   Social Drivers of Corporate investment banker Strain: High Risk (06/09/2023)   Overall Financial Resource Strain (CARDIA)    Difficulty of Paying Living Expenses: Very hard  Food Insecurity: Food Insecurity Present (06/16/2023)   Hunger Vital Sign    Worried About Running Out of Food in the Last Year: Often true    Ran Out of Food in the Last Year: Often true  Transportation Needs: Unmet Transportation Needs (06/16/2023)   PRAPARE - Transportation    Lack of Transportation (Medical): Yes    Lack of Transportation (Non-Medical): Yes  Physical Activity: Inactive (03/25/2023)   Exercise Vital Sign    Days of Exercise per Week: 0 days    Minutes of Exercise per Session: 0 min  Stress: Stress Concern Present (03/25/2023)   [employer] of Occupational Health - Occupational Stress Questionnaire    Feeling of Stress : Rather much  Social Connections: Socially Integrated (06/16/2023)   Social Connection and Isolation Panel    Frequency of Communication with Friends and Family: More than three times a week    Frequency of Social Gatherings with Friends and Family: Never    Attends Religious Services: More than 4 times per year    Active Member of [employer] or Organizations: Yes    Attends Banker Meetings: Never    Marital Status: Married     Family History: The patient's family history includes Heart disease in his father, mother, and sister. ROS:   Please see the history of present illness.    All 14 point review of systems negative except as described per history of present illness.  EKGs/Labs/Other Studies Reviewed:     The following studies were reviewed today:   EKG:       Recent Labs: 09/21/2022: ALT 30 06/17/2023: BUN 10; Creatinine, Ser 1.30; Hemoglobin 14.6; Platelets 248; Potassium 4.0; Sodium 137  Recent Lipid Panel    Component Value Date/Time   CHOL 224 (H)  06/16/2023 0405   TRIG 84 06/16/2023 0405   HDL 34 (L) 06/16/2023 0405   CHOLHDL 6.6 06/16/2023 0405   VLDL 17 06/16/2023 0405   LDLCALC 173 (H) 06/16/2023 0405    Physical Exam:    VS:  BP 118/84   Pulse 80   Ht 6' (1.829 m)   Wt 204 lb 9.6 oz (92.8 kg)   SpO2 95%   BMI 27.75 kg/m     Wt Readings from Last 3 Encounters:  08/18/23 204 lb 9.6 oz (92.8 kg)  07/05/23 203 lb 6.4 oz (92.3 kg)  06/16/23 190 lb 8 oz (86.4 kg)     GENERAL:  Well nourished, well developed in no acute distress NECK: No JVD; No carotid bruits CARDIAC: RRR, S1 and S2 present, no murmurs, no rubs, no gallops CHEST:  Clear to auscultation without rales, wheezing or rhonchi  Extremities: No pitting pedal edema. Pulses bilaterally symmetric with radial 2+ and dorsalis pedis 2+ NEUROLOGIC:  Alert and oriented x 3  Medication Adjustments/Labs and Tests Ordered: Current medicines are reviewed at length with the patient today.  Concerns regarding medicines are outlined above.  No orders of the defined types were placed in this encounter.  Meds ordered this encounter  Medications   isosorbide  mononitrate (IMDUR ) 120 MG 24 hr tablet    Sig: Take 1 tablet (120 mg total) by mouth daily.    Dispense:  90 tablet    Refill:  3   nitroGLYCERIN  (NITROSTAT ) 0.4 MG SL tablet    Sig: Place 1 tablet (0.4 mg total) under the tongue every 5 (five) minutes as needed for chest pain.    Dispense:  25 tablet    Refill:  1   atorvastatin  (LIPITOR ) 80 MG tablet    Sig: Take 1 tablet (80 mg total) by mouth daily.    Dispense:  90 tablet    Refill:  3   carvedilol  (COREG ) 6.25 MG tablet    Sig: Take 1 tablet (6.25 mg total) by mouth 2 (two) times daily with a meal.     Dispense:  180 tablet    Refill:  3    Signed, Darice Vicario reddy Lorana Maffeo, MD, MPH, [facility]. 08/18/2023 10:56 AM    [facility]

## 2023-08-18 NOTE — Assessment & Plan Note (Signed)
 Euvolemic in complaints.  NYHA class I. Mildly reduced LVEF 45 to 50% on echocardiogram June 16, 2023. Continue with salt and fluid restriction, he mentions salt restriction has been difficult given his status of living at the shelter and limited food options.  Mentions he does have access to food stamps and will purchase a low-salt diets. Continue carvedilol . Losartan  was started by PCP however he does not appear to be taking it right now. Will follow-up in 2 weeks and will resume losartan  and consider other medications spironolactone and SGLT2 inhibitors based on blood work

## 2023-08-18 NOTE — Patient Instructions (Signed)
 Medication Instructions:  Your physician has recommended you make the following change in your medication:   START: Imdur  120 mg daily Nitroglycerin  0.4 mg under the tongue x 3 as needed for chest pain  *If you need a refill on your cardiac medications before your next appointment, please call your pharmacy*  Lab Work: None If you have labs (blood work) drawn today and your tests are completely normal, you will receive your results only by: MyChart Message (if you have MyChart) OR A paper copy in the mail If you have any lab test that is abnormal or we need to change your treatment, we will call you to review the results.  Testing/Procedures: None  Follow-Up: At [facility], you and your health needs are our priority.  As part of our continuing mission to provide you with exceptional heart care, our providers are all part of one team.  This team includes your primary Cardiologist (physician) and Advanced Practice Providers or APPs (Physician Assistants and Nurse Practitioners) who all work together to provide you with the care you need, when you need it.  Your next appointment:   2 week(s)  Provider:   Bertha Broad, MD    We recommend signing up for the patient portal called MyChart.  Sign up information is provided on this After Visit Summary.  MyChart is used to connect with patients for Virtual Visits (Telemedicine).  Patients are able to view lab/test results, encounter notes, upcoming appointments, etc.  Non-urgent messages can be sent to your provider as well.   To learn more about what you can do with MyChart, go to [email or web address].   Other Instructions None

## 2023-09-01 ENCOUNTER — Ambulatory Visit

## 2023-09-16 ENCOUNTER — Emergency Department (HOSPITAL_COMMUNITY): Admission: EM | Admit: 2023-09-16 | Discharge: 2023-09-16 | Disposition: A | Discharge status: Home / Self Care

## 2023-09-16 ENCOUNTER — Encounter (HOSPITAL_COMMUNITY): Payer: Self-pay | Admitting: *Deleted

## 2023-09-16 ENCOUNTER — Other Ambulatory Visit: Payer: Self-pay

## 2023-09-16 DIAGNOSIS — M62830 Muscle spasm of back: Secondary | ICD-10-CM | POA: Diagnosis not present

## 2023-09-16 DIAGNOSIS — M549 Dorsalgia, unspecified: Secondary | ICD-10-CM | POA: Diagnosis present

## 2023-09-16 DIAGNOSIS — Z7901 Long term (current) use of anticoagulants: Secondary | ICD-10-CM | POA: Insufficient documentation

## 2023-09-16 DIAGNOSIS — Y92410 Unspecified street and highway as the place of occurrence of the external cause: Secondary | ICD-10-CM | POA: Diagnosis not present

## 2023-09-16 DIAGNOSIS — Z7982 Long term (current) use of aspirin: Secondary | ICD-10-CM | POA: Insufficient documentation

## 2023-09-16 MED ORDER — METHOCARBAMOL 500 MG PO TABS: 500.0000 mg | ORAL_TABLET | Freq: Four times a day (QID) | ORAL | 0 refills | Status: DC | PRN

## 2023-09-16 MED ORDER — KETOROLAC TROMETHAMINE 15 MG/ML IJ SOLN
15.0000 mg | Freq: Once | INTRAMUSCULAR | Status: AC
  Administered 2023-09-16: 15 mg via INTRAMUSCULAR
  Filled 2023-09-16: qty 1

## 2023-09-16 NOTE — Discharge Instructions (Signed)
 Take the Robaxin  up to 4 times a day as needed for pain.  You can use Tylenol  in between as needed for pain.  Stay on your other medications as prescribed.
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
## 2023-09-18 ENCOUNTER — Other Ambulatory Visit: Payer: Self-pay

## 2023-09-18 ENCOUNTER — Encounter (HOSPITAL_COMMUNITY): Payer: Self-pay | Admitting: *Deleted

## 2023-09-18 ENCOUNTER — Emergency Department (HOSPITAL_COMMUNITY)

## 2023-09-18 ENCOUNTER — Emergency Department (HOSPITAL_COMMUNITY)
Admission: EM | Admit: 2023-09-18 | Discharge: 2023-09-18 | Disposition: A | Discharge status: Home / Self Care | Attending: Emergency Medicine | Admitting: Emergency Medicine

## 2023-09-18 DIAGNOSIS — Z7982 Long term (current) use of aspirin: Secondary | ICD-10-CM | POA: Insufficient documentation

## 2023-09-18 DIAGNOSIS — Z7901 Long term (current) use of anticoagulants: Secondary | ICD-10-CM | POA: Diagnosis not present

## 2023-09-18 DIAGNOSIS — N200 Calculus of kidney: Secondary | ICD-10-CM | POA: Insufficient documentation

## 2023-09-18 DIAGNOSIS — I700 Atherosclerosis of aorta: Secondary | ICD-10-CM | POA: Insufficient documentation

## 2023-09-18 DIAGNOSIS — I7143 Infrarenal abdominal aortic aneurysm, without rupture: Secondary | ICD-10-CM | POA: Diagnosis not present

## 2023-09-18 DIAGNOSIS — M545 Low back pain, unspecified: Secondary | ICD-10-CM | POA: Diagnosis present

## 2023-09-18 DIAGNOSIS — M62830 Muscle spasm of back: Secondary | ICD-10-CM | POA: Insufficient documentation

## 2023-09-18 LAB — URINALYSIS, ROUTINE W REFLEX MICROSCOPIC
Bilirubin Urine: NEGATIVE
Glucose, UA: NEGATIVE mg/dL
Hgb urine dipstick: NEGATIVE
Ketones, ur: NEGATIVE mg/dL
Leukocytes,Ua: NEGATIVE
Nitrite: NEGATIVE
Protein, ur: 100 mg/dL — AB
Specific Gravity, Urine: 1.033 — ABNORMAL HIGH (ref 1.005–1.030)
pH: 5 (ref 5.0–8.0)

## 2023-09-18 MED ORDER — DICLOFENAC SODIUM ER 100 MG PO TB24: 100.0000 mg | ORAL_TABLET | Freq: Every day | ORAL | 0 refills | Status: DC

## 2023-09-18 MED ORDER — LIDOCAINE 5 % EX PTCH
1.0000 | MEDICATED_PATCH | CUTANEOUS | 0 refills | Status: DC
Start: 2023-09-18 — End: 2023-11-18

## 2023-09-18 MED ORDER — LIDOCAINE 5 % EX PTCH: 1.0000 | MEDICATED_PATCH | CUTANEOUS | 0 refills | Status: DC

## 2023-09-18 MED ORDER — METHOCARBAMOL 500 MG PO TABS: 500.0000 mg | ORAL_TABLET | Freq: Four times a day (QID) | ORAL | 0 refills | Status: DC

## 2023-09-18 MED ORDER — KETOROLAC TROMETHAMINE 60 MG/2ML IM SOLN
60.0000 mg | Freq: Once | INTRAMUSCULAR | Status: AC
  Administered 2023-09-18: 60 mg via INTRAMUSCULAR
  Filled 2023-09-18: qty 2

## 2023-09-18 MED ORDER — ACETAMINOPHEN 325 MG PO TABS
650.0000 mg | ORAL_TABLET | Freq: Once | ORAL | Status: AC
  Administered 2023-09-18: 650 mg via ORAL
  Filled 2023-09-18: qty 2
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
## 2023-10-13 ENCOUNTER — Emergency Department (HOSPITAL_COMMUNITY)

## 2023-10-13 ENCOUNTER — Encounter (HOSPITAL_COMMUNITY): Payer: Self-pay

## 2023-10-13 ENCOUNTER — Other Ambulatory Visit: Payer: Self-pay

## 2023-10-13 ENCOUNTER — Inpatient Hospital Stay (HOSPITAL_COMMUNITY)
Admission: EM | Admit: 2023-10-13 | Discharge: 2023-10-18 | DRG: 065 | Discharge status: Against Medical Advice | Attending: Internal Medicine | Admitting: Internal Medicine

## 2023-10-13 DIAGNOSIS — Z7982 Long term (current) use of aspirin: Secondary | ICD-10-CM

## 2023-10-13 DIAGNOSIS — G894 Chronic pain syndrome: Secondary | ICD-10-CM | POA: Diagnosis present

## 2023-10-13 DIAGNOSIS — F1910 Other psychoactive substance abuse, uncomplicated: Secondary | ICD-10-CM | POA: Diagnosis present

## 2023-10-13 DIAGNOSIS — Z5901 Sheltered homelessness: Secondary | ICD-10-CM

## 2023-10-13 DIAGNOSIS — I6381 Other cerebral infarction due to occlusion or stenosis of small artery: Principal | ICD-10-CM | POA: Diagnosis present

## 2023-10-13 DIAGNOSIS — M51369 Other intervertebral disc degeneration, lumbar region without mention of lumbar back pain or lower extremity pain: Secondary | ICD-10-CM | POA: Diagnosis present

## 2023-10-13 DIAGNOSIS — F17210 Nicotine dependence, cigarettes, uncomplicated: Secondary | ICD-10-CM | POA: Diagnosis present

## 2023-10-13 DIAGNOSIS — Z720 Tobacco use: Secondary | ICD-10-CM | POA: Diagnosis not present

## 2023-10-13 DIAGNOSIS — I130 Hypertensive heart and chronic kidney disease with heart failure and stage 1 through stage 4 chronic kidney disease, or unspecified chronic kidney disease: Secondary | ICD-10-CM | POA: Diagnosis present

## 2023-10-13 DIAGNOSIS — I255 Ischemic cardiomyopathy: Secondary | ICD-10-CM | POA: Diagnosis present

## 2023-10-13 DIAGNOSIS — Z955 Presence of coronary angioplasty implant and graft: Secondary | ICD-10-CM | POA: Diagnosis not present

## 2023-10-13 DIAGNOSIS — F1210 Cannabis abuse, uncomplicated: Secondary | ICD-10-CM | POA: Diagnosis not present

## 2023-10-13 DIAGNOSIS — M542 Cervicalgia: Secondary | ICD-10-CM | POA: Diagnosis not present

## 2023-10-13 DIAGNOSIS — Z951 Presence of aortocoronary bypass graft: Secondary | ICD-10-CM

## 2023-10-13 DIAGNOSIS — R079 Chest pain, unspecified: Secondary | ICD-10-CM | POA: Diagnosis present

## 2023-10-13 DIAGNOSIS — Z91148 Patient's other noncompliance with medication regimen for other reason: Secondary | ICD-10-CM

## 2023-10-13 DIAGNOSIS — Z5329 Procedure and treatment not carried out because of patient's decision for other reasons: Secondary | ICD-10-CM | POA: Diagnosis present

## 2023-10-13 DIAGNOSIS — I639 Cerebral infarction, unspecified: Secondary | ICD-10-CM | POA: Diagnosis not present

## 2023-10-13 DIAGNOSIS — I2510 Atherosclerotic heart disease of native coronary artery without angina pectoris: Secondary | ICD-10-CM | POA: Diagnosis present

## 2023-10-13 DIAGNOSIS — E785 Hyperlipidemia, unspecified: Secondary | ICD-10-CM | POA: Diagnosis present

## 2023-10-13 DIAGNOSIS — F32A Depression, unspecified: Secondary | ICD-10-CM | POA: Diagnosis present

## 2023-10-13 DIAGNOSIS — Z8249 Family history of ischemic heart disease and other diseases of the circulatory system: Secondary | ICD-10-CM | POA: Diagnosis not present

## 2023-10-13 DIAGNOSIS — F1410 Cocaine abuse, uncomplicated: Secondary | ICD-10-CM | POA: Diagnosis present

## 2023-10-13 DIAGNOSIS — T45516A Underdosing of anticoagulants, initial encounter: Secondary | ICD-10-CM

## 2023-10-13 DIAGNOSIS — F1490 Cocaine use, unspecified, uncomplicated: Secondary | ICD-10-CM | POA: Diagnosis not present

## 2023-10-13 DIAGNOSIS — Z7901 Long term (current) use of anticoagulants: Secondary | ICD-10-CM

## 2023-10-13 DIAGNOSIS — I10 Essential (primary) hypertension: Secondary | ICD-10-CM | POA: Diagnosis not present

## 2023-10-13 DIAGNOSIS — Z79899 Other long term (current) drug therapy: Secondary | ICD-10-CM

## 2023-10-13 DIAGNOSIS — I252 Old myocardial infarction: Secondary | ICD-10-CM | POA: Diagnosis not present

## 2023-10-13 DIAGNOSIS — I700 Atherosclerosis of aorta: Secondary | ICD-10-CM | POA: Diagnosis present

## 2023-10-13 DIAGNOSIS — I5022 Chronic systolic (congestive) heart failure: Secondary | ICD-10-CM | POA: Diagnosis present

## 2023-10-13 DIAGNOSIS — I739 Peripheral vascular disease, unspecified: Secondary | ICD-10-CM | POA: Diagnosis present

## 2023-10-13 DIAGNOSIS — I77811 Abdominal aortic ectasia: Secondary | ICD-10-CM | POA: Diagnosis present

## 2023-10-13 DIAGNOSIS — R200 Anesthesia of skin: Secondary | ICD-10-CM | POA: Diagnosis present

## 2023-10-13 DIAGNOSIS — I25709 Atherosclerosis of coronary artery bypass graft(s), unspecified, with unspecified angina pectoris: Secondary | ICD-10-CM | POA: Diagnosis present

## 2023-10-13 DIAGNOSIS — I69351 Hemiplegia and hemiparesis following cerebral infarction affecting right dominant side: Secondary | ICD-10-CM | POA: Diagnosis not present

## 2023-10-13 DIAGNOSIS — I509 Heart failure, unspecified: Secondary | ICD-10-CM | POA: Diagnosis not present

## 2023-10-13 DIAGNOSIS — N189 Chronic kidney disease, unspecified: Secondary | ICD-10-CM | POA: Diagnosis not present

## 2023-10-13 DIAGNOSIS — I69391 Dysphagia following cerebral infarction: Secondary | ICD-10-CM | POA: Diagnosis not present

## 2023-10-13 DIAGNOSIS — I77810 Thoracic aortic ectasia: Secondary | ICD-10-CM | POA: Diagnosis present

## 2023-10-13 DIAGNOSIS — I5020 Unspecified systolic (congestive) heart failure: Secondary | ICD-10-CM | POA: Diagnosis not present

## 2023-10-13 DIAGNOSIS — I779 Disorder of arteries and arterioles, unspecified: Secondary | ICD-10-CM | POA: Diagnosis not present

## 2023-10-13 DIAGNOSIS — N182 Chronic kidney disease, stage 2 (mild): Secondary | ICD-10-CM

## 2023-10-13 DIAGNOSIS — N1831 Chronic kidney disease, stage 3a: Secondary | ICD-10-CM | POA: Diagnosis present

## 2023-10-13 DIAGNOSIS — R4781 Slurred speech: Secondary | ICD-10-CM | POA: Diagnosis present

## 2023-10-13 DIAGNOSIS — I6389 Other cerebral infarction: Secondary | ICD-10-CM | POA: Diagnosis not present

## 2023-10-13 DIAGNOSIS — R0789 Other chest pain: Secondary | ICD-10-CM

## 2023-10-13 DIAGNOSIS — I110 Hypertensive heart disease with heart failure: Secondary | ICD-10-CM | POA: Diagnosis not present

## 2023-10-13 DIAGNOSIS — I5042 Chronic combined systolic (congestive) and diastolic (congestive) heart failure: Secondary | ICD-10-CM | POA: Diagnosis present

## 2023-10-13 DIAGNOSIS — Z888 Allergy status to other drugs, medicaments and biological substances status: Secondary | ICD-10-CM

## 2023-10-13 DIAGNOSIS — R29701 NIHSS score 1: Secondary | ICD-10-CM | POA: Diagnosis present

## 2023-10-13 DIAGNOSIS — N179 Acute kidney failure, unspecified: Secondary | ICD-10-CM | POA: Diagnosis not present

## 2023-10-13 DIAGNOSIS — I5030 Unspecified diastolic (congestive) heart failure: Secondary | ICD-10-CM | POA: Diagnosis not present

## 2023-10-13 DIAGNOSIS — I77819 Aortic ectasia, unspecified site: Secondary | ICD-10-CM | POA: Insufficient documentation

## 2023-10-13 DIAGNOSIS — M79605 Pain in left leg: Secondary | ICD-10-CM | POA: Diagnosis present

## 2023-10-13 LAB — LIPASE, BLOOD: Lipase: 32 U/L (ref 11–51)

## 2023-10-13 LAB — CBC WITH DIFFERENTIAL/PLATELET
Abs Immature Granulocytes: 0.01 K/uL (ref 0.00–0.07)
Basophils Absolute: 0 K/uL (ref 0.0–0.1)
Basophils Relative: 1 %
Eosinophils Absolute: 0.1 K/uL (ref 0.0–0.5)
Eosinophils Relative: 1 %
HCT: 43.6 % (ref 39.0–52.0)
Hemoglobin: 14.3 g/dL (ref 13.0–17.0)
Immature Granulocytes: 0 %
Lymphocytes Relative: 26 %
Lymphs Abs: 1.6 K/uL (ref 0.7–4.0)
MCH: 31 pg (ref 26.0–34.0)
MCHC: 32.8 g/dL (ref 30.0–36.0)
MCV: 94.4 fL (ref 80.0–100.0)
Monocytes Absolute: 0.6 K/uL (ref 0.1–1.0)
Monocytes Relative: 10 %
Neutro Abs: 3.7 K/uL (ref 1.7–7.7)
Neutrophils Relative %: 62 %
Platelets: 211 K/uL (ref 150–400)
RBC: 4.62 MIL/uL (ref 4.22–5.81)
RDW: 14.2 % (ref 11.5–15.5)
WBC: 5.9 K/uL (ref 4.0–10.5)
nRBC: 0 % (ref 0.0–0.2)

## 2023-10-13 LAB — HEPATIC FUNCTION PANEL
ALT: 17 U/L (ref 0–44)
AST: 30 U/L (ref 15–41)
Albumin: 3.5 g/dL (ref 3.5–5.0)
Alkaline Phosphatase: 67 U/L (ref 38–126)
Bilirubin, Direct: 0.1 mg/dL (ref 0.0–0.2)
Indirect Bilirubin: 0.6 mg/dL (ref 0.3–0.9)
Total Bilirubin: 0.7 mg/dL (ref 0.0–1.2)
Total Protein: 7.8 g/dL (ref 6.5–8.1)

## 2023-10-13 LAB — BASIC METABOLIC PANEL WITH GFR
Anion gap: 11 (ref 5–15)
BUN: 19 mg/dL (ref 6–20)
CO2: 20 mmol/L — ABNORMAL LOW (ref 22–32)
Calcium: 10.1 mg/dL (ref 8.9–10.3)
Chloride: 107 mmol/L (ref 98–111)
Creatinine, Ser: 1.62 mg/dL — ABNORMAL HIGH (ref 0.61–1.24)
GFR, Estimated: 49 mL/min — ABNORMAL LOW (ref >60)
Glucose, Bld: 91 mg/dL (ref 70–99)
Potassium: 4.4 mmol/L (ref 3.5–5.1)
Sodium: 138 mmol/L (ref 135–145)

## 2023-10-13 LAB — LIPID PANEL
Cholesterol: 243 mg/dL — ABNORMAL HIGH (ref 0–200)
HDL: 37 mg/dL — ABNORMAL LOW (ref >40)
LDL Cholesterol: 193 mg/dL — ABNORMAL HIGH (ref 0–99)
Total CHOL/HDL Ratio: 6.6 ratio
Triglycerides: 65 mg/dL (ref <150)
VLDL: 13 mg/dL (ref 0–40)

## 2023-10-13 LAB — HEMOGLOBIN A1C
Hgb A1c MFr Bld: 5.4 % (ref 4.8–5.6)
Mean Plasma Glucose: 108.28 mg/dL

## 2023-10-13 LAB — BRAIN NATRIURETIC PEPTIDE: B Natriuretic Peptide: 118.1 pg/mL — ABNORMAL HIGH (ref 0.0–100.0)

## 2023-10-13 LAB — TROPONIN I (HIGH SENSITIVITY)
Troponin I (High Sensitivity): 37 ng/L — ABNORMAL HIGH (ref <18)
Troponin I (High Sensitivity): 48 ng/L — ABNORMAL HIGH
Troponin I (High Sensitivity): 30 ng/L — ABNORMAL HIGH (ref <18)
Troponin I (High Sensitivity): 49 ng/L — ABNORMAL HIGH (ref <18)

## 2023-10-13 MED ORDER — METHOCARBAMOL 500 MG PO TABS
500.0000 mg | ORAL_TABLET | Freq: Four times a day (QID) | ORAL | Status: DC | PRN
  Administered 2023-10-13 – 2023-10-17 (×5): 500 mg via ORAL
  Filled 2023-10-13 (×5): qty 1

## 2023-10-13 MED ORDER — ACETAMINOPHEN 325 MG PO TABS
650.0000 mg | ORAL_TABLET | Freq: Four times a day (QID) | ORAL | Status: DC | PRN
Start: 2023-10-13 — End: 2023-10-16
  Administered 2023-10-13 – 2023-10-15 (×4): 650 mg via ORAL
  Filled 2023-10-13 (×4): qty 2

## 2023-10-13 MED ORDER — MORPHINE SULFATE (PF) 4 MG/ML IV SOLN
4.0000 mg | Freq: Once | INTRAVENOUS | Status: AC
  Administered 2023-10-13: 4 mg via INTRAVENOUS
  Filled 2023-10-13: qty 1

## 2023-10-13 MED ORDER — IOHEXOL 350 MG/ML SOLN
75.0000 mL | Freq: Once | INTRAVENOUS | Status: AC | PRN
Start: 2023-10-13 — End: 2023-10-13
  Administered 2023-10-13: 75 mL via INTRAVENOUS

## 2023-10-13 MED ORDER — RANOLAZINE ER 500 MG PO TB12
1000.0000 mg | ORAL_TABLET | Freq: Two times a day (BID) | ORAL | Status: DC
  Administered 2023-10-13 – 2023-10-16 (×7): 1000 mg via ORAL
  Filled 2023-10-13 (×8): qty 2

## 2023-10-13 MED ORDER — ORAL CARE MOUTH RINSE: 15.0000 mL | OROMUCOSAL | Status: DC | PRN

## 2023-10-13 MED ORDER — CARVEDILOL 6.25 MG PO TABS
6.2500 mg | ORAL_TABLET | Freq: Two times a day (BID) | ORAL | Status: DC
  Administered 2023-10-13 – 2023-10-18 (×10): 6.25 mg via ORAL
  Filled 2023-10-13 (×10): qty 1

## 2023-10-13 MED ORDER — ASPIRIN 81 MG PO TBEC
81.0000 mg | DELAYED_RELEASE_TABLET | Freq: Every day | ORAL | Status: DC
  Administered 2023-10-13 – 2023-10-18 (×6): 81 mg via ORAL
  Filled 2023-10-13 (×6): qty 1

## 2023-10-13 MED ORDER — RIVAROXABAN 20 MG PO TABS
20.0000 mg | ORAL_TABLET | Freq: Every day | ORAL | Status: DC
  Administered 2023-10-13 – 2023-10-17 (×5): 20 mg via ORAL
  Filled 2023-10-13 (×5): qty 1

## 2023-10-13 MED ORDER — ENOXAPARIN SODIUM 40 MG/0.4ML IJ SOSY: 40.0000 mg | PREFILLED_SYRINGE | INTRAMUSCULAR | Status: DC

## 2023-10-13 MED ORDER — GABAPENTIN 300 MG PO CAPS
300.0000 mg | ORAL_CAPSULE | Freq: Three times a day (TID) | ORAL | Status: DC
  Administered 2023-10-13 – 2023-10-18 (×15): 300 mg via ORAL
  Filled 2023-10-13 (×15): qty 1

## 2023-10-13 MED ORDER — DICLOFENAC SODIUM 1 % EX GEL
4.0000 g | Freq: Four times a day (QID) | CUTANEOUS | Status: DC
  Administered 2023-10-13 – 2023-10-18 (×9): 4 g via TOPICAL
  Filled 2023-10-13 (×2): qty 100

## 2023-10-13 MED ORDER — ACETAMINOPHEN 650 MG RE SUPP: 650.0000 mg | Freq: Four times a day (QID) | RECTAL | Status: DC | PRN

## 2023-10-13 MED ORDER — ISOSORBIDE MONONITRATE ER 60 MG PO TB24
120.0000 mg | ORAL_TABLET | Freq: Every day | ORAL | Status: DC
  Administered 2023-10-13 – 2023-10-16 (×4): 120 mg via ORAL
  Filled 2023-10-13 (×2): qty 2
  Filled 2023-10-13: qty 4
  Filled 2023-10-13: qty 2

## 2023-10-13 MED ORDER — LIDOCAINE 5 % EX PTCH
1.0000 | MEDICATED_PATCH | CUTANEOUS | Status: DC
  Administered 2023-10-13 – 2023-10-17 (×5): 1 via TRANSDERMAL
  Filled 2023-10-13 (×5): qty 1

## 2023-10-13 MED ORDER — LOSARTAN POTASSIUM 50 MG PO TABS
50.0000 mg | ORAL_TABLET | Freq: Every day | ORAL | Status: DC
  Administered 2023-10-13 – 2023-10-18 (×6): 50 mg via ORAL
  Filled 2023-10-13 (×6): qty 1

## 2023-10-13 MED ORDER — ATORVASTATIN CALCIUM 80 MG PO TABS
80.0000 mg | ORAL_TABLET | Freq: Every day | ORAL | Status: DC
  Administered 2023-10-13 – 2023-10-18 (×6): 80 mg via ORAL
  Filled 2023-10-13: qty 1
  Filled 2023-10-13: qty 2
  Filled 2023-10-13 (×4): qty 1

## 2023-10-13 MED ORDER — CILOSTAZOL 100 MG PO TABS
100.0000 mg | ORAL_TABLET | Freq: Two times a day (BID) | ORAL | Status: DC
  Administered 2023-10-14 – 2023-10-18 (×9): 100 mg via ORAL
  Filled 2023-10-13 (×10): qty 1
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
## 2023-10-13 NOTE — Progress Notes (Signed)
 CSW added substance abuse resources to patient's AVS.  Niels Portugal, MSW, LCSW Transitions of Care  Clinical Social Worker II [phone]
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
## 2023-10-13 NOTE — ED Notes (Signed)
 Phlebotomy to collect all outstanding labs.

## 2023-10-13 NOTE — Consult Note (Addendum)
 NEUROLOGY CONSULT NOTE   Date of service: October 13, 2023 Patient Name: Benjamin Espinoza MRN:  [id number] DOB:  [birth year] Chief Complaint: chest pain Requesting Provider: Ginger Lonni PARAS, *  History of Present Illness  Benjamin Espinoza is a 58 y.o. male with hx of asthma, anxiety, CAD w/ previous MI and CABG, HLD, thoracic aortic dilation, substance abuse disorder who presented to the emergency department for chest pain that has been going on for about a week, worsened last night with the use of cocaine. He states that he has been having intermittent chest pain with exertion chronically, but this morning while eating his breakfast he was getting diaphoretic and had crushing pain in his chest located substernally. He also had one short episode of aphasia, where his friends could not understand what he was saying. He also has had right arm numbness for the last week or so, and during this time he feels like he randomly drops objects that are in his right arm. The numbness he feels like is coming from the right side of his neck, and he describes it as if someone hit my funny bone. His last use of cocaine was yesterday, and he has been using cocaine twice a day about four times a week.  Of note, he has not taken any of his medications in the last year or so.    ROS  Comprehensive ROS performed and pertinent positives documented in HPI    Past History   Past Medical History:  Diagnosis Date   Abnormal stress test    Acute left ankle pain 03/23/2023   Anxiety    Asthma    as a child   Atypical chest pain 05/07/2017   Chest pain  Unstable angina 06/15/2023   Chronic bilateral low back pain with left-sided sciatica 01/11/2023   Chronic health problem 06/15/2023   Chronic pain syndrome 03/29/2023   Cocaine abuse (HCC) 06/16/2023   Coronary artery disease    DDD (degenerative disc disease), lumbar 11/08/2019   Formatting of this note might be different from the original. Ortho  [place]   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   Transthoracic echocardiogram 06/16/23: Aortic dilatation noted. There is mild dilatation of the ascending aorta, measuring 43 mm. There is mild dilatation of the aortic root, measuring 44 mm.      Encounter for medication monitoring 01/10/2023   Encounter for well adult exam with abnormal findings 04/08/2023   Essential hypertension    Heart failure with mid-range ejection fraction (HFmEF) (HCC) 06/17/2023   06/16/23 transthoracic echocardiogram: Left Ventricle: Left ventricular ejection fraction 45 to 50%. The left ventricle has mildly decreased function. No left ventricular hypertrophy. 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 [place]    Hyperlipidemia    Impaired mobility and ADLs 03/23/2023   Ischemic cardiomyopathy 12/17/2020   Lumbar radiculopathy 07/05/2023   Myocardial infarction [facility]) 2017   NSTEMI (non-ST elevated myocardial infarction) (HCC) 11/28/2020   OAB (overactive bladder) 01/11/2023   PAD (peripheral artery disease) (HCC) 09/21/2022   PVD (peripheral vascular disease) (HCC) 09/21/2019   Right leg pain 03/23/2023   Shortness of breath    Sinus bradycardia 11/29/2019   Tobacco use disorder    Unhoused person 06/16/2023   Unstable angina (HCC) 06/15/2023    Past Surgical History:  Procedure Laterality Date   ABDOMINAL AORTOGRAM W/LOWER EXTREMITY Bilateral 09/21/2019   Procedure: ABDOMINAL AORTOGRAM W/LOWER EXTREMITY;  Surgeon: Eliza Lonni GORMAN,  MD;  Location: [facility] INVASIVE CV LAB;  Service: Cardiovascular;  Laterality: Bilateral;   ABDOMINAL AORTOGRAM W/LOWER EXTREMITY N/A 12/07/2019   Procedure: ABDOMINAL AORTOGRAM W/LOWER EXTREMITY;  Surgeon: Eliza Lonni RAMAN, MD;  Location: [facility] INVASIVE CV LAB;  Service: Cardiovascular;  Laterality: N/A;   ABDOMINAL AORTOGRAM W/LOWER EXTREMITY N/A 09/09/2022   Procedure: ABDOMINAL AORTOGRAM W/LOWER EXTREMITY;  Surgeon: Gretta Lonni PARAS, MD;   Location: [facility] INVASIVE CV LAB;  Service: Vascular;  Laterality: N/A;   CARDIAC CATHETERIZATION     CORONARY ARTERY BYPASS GRAFT     quadruple bypas 2017   CORONARY STENT INTERVENTION N/A 11/26/2019   Procedure: CORONARY STENT INTERVENTION;  Surgeon: Mady Lonni, MD;  Location: [facility] INVASIVE CV LAB;  Service: Cardiovascular;  Laterality: N/A;   FEMORAL-POPLITEAL BYPASS GRAFT Right 12/25/2019   Procedure: BYPASS GRAFT FEMORAL- BELOW KNEE POPLITEAL ARTERY RIGHT USING GORE PROPATEN VASCULAR GRAFT;  Surgeon: Eliza Lonni RAMAN, MD;  Location: [facility] OR;  Service: Vascular;  Laterality: Right;   FEMORAL-POPLITEAL BYPASS GRAFT Right 09/21/2022   Procedure: REDO RIGHT LOWER EXTREMITY BYPASS USING GORE PROPATEN 6mm REMOVALBLE RING GRAFT;  Surgeon: Eliza Lonni RAMAN, MD;  Location: [facility] OR;  Service: Vascular;  Laterality: Right;   INTRAOPERATIVE ARTERIOGRAM Right 09/21/2022   Procedure: INTRA OPERATIVE ARTERIOGRAM;  Surgeon: Eliza Lonni RAMAN, MD;  Location: [facility] OR;  Service: Vascular;  Laterality: Right;   LEFT HEART CATH AND CORS/GRAFTS ANGIOGRAPHY N/A 11/26/2019   Procedure: LEFT HEART CATH AND CORS/GRAFTS ANGIOGRAPHY;  Surgeon: Mady Lonni, MD;  Location: [facility]VAS[facility] CV [facility];  Service: Cardiovascular;  Laterality: N/A;   LEFT HEART CATH AND CORS/GRAFTS ANGIOGRAPHY N/A 12/01/2020   Procedure: LEFT HEART CATH AND CORS/GRAFTS ANGIOGRAPHY;  Surgeon: Burnard Debby LABOR, MD;  Location: [facility] INVASIVE CV LAB;  Service: Cardiovascular;  Laterality: N/A;   LEFT HEART CATH AND CORS/GRAFTS ANGIOGRAPHY N/A 06/16/2023   Procedure: LEFT HEART CATH AND CORS/GRAFTS ANGIOGRAPHY;  Surgeon: [place], Peter M, MD;  Location: [facility] INVASIVE CV LAB;  Service: Cardiovascular;  Laterality: N/A;   LOWER EXTREMITY ANGIOGRAM Right 12/25/2019   Procedure: RIGHT LOWER EXTREMITY ANGIOGRAM;  Surgeon: Eliza Lonni RAMAN, MD;  Location: [facility] OR;  Service: Vascular;  Laterality: Right;   PERIPHERAL VASCULAR BALLOON ANGIOPLASTY Left  12/07/2019   Procedure: PERIPHERAL VASCULAR BALLOON ANGIOPLASTY;  Surgeon: Eliza Lonni RAMAN, MD;  Location: [facility] INVASIVE CV LAB;  Service: Cardiovascular;  Laterality: Left;  SFA    Family History: Family History  Problem Relation Age of Onset   Heart disease Mother    Heart disease Father    Heart disease Sister     Social History  reports that he has been smoking cigars and cigarettes. He has been exposed to tobacco smoke. He has never used smokeless tobacco. He reports current drug use. Drugs: Marijuana and Cocaine. He reports that he does not drink alcohol.  Allergies  Allergen Reactions   Isosorbide      Severe headache    Medications  No current facility-administered medications for this encounter.  Current Outpatient Medications:    aspirin  EC 81 MG tablet, Take 1 tablet (81 mg total) by mouth daily. (Patient not taking: Reported on 08/18/2023), Disp: , Rfl:    atorvastatin  (LIPITOR ) 80 MG tablet, Take 1 tablet (80 mg total) by mouth daily., Disp: 90 tablet, Rfl: 3   carvedilol  (COREG ) 6.25 MG tablet, Take 1 tablet (6.25 mg total) by mouth 2 (two) times daily with a meal., Disp: 180 tablet, Rfl: 3   gabapentin  (NEURONTIN ) 300 MG capsule, Take  300 mg by mouth 3 (three) times daily., Disp: , Rfl:    isosorbide  mononitrate (IMDUR ) 120 MG 24 hr tablet, Take 1 tablet (120 mg total) by mouth daily., Disp: 90 tablet, Rfl: 3   lidocaine  (LIDODERM ) 5 %, Place 1 patch onto the skin daily. Remove & Discard patch within 12 hours or as directed by MD, Disp: 30 patch, Rfl: 0   losartan  (COZAAR ) 50 MG tablet, Take 50 mg by mouth daily. (Patient not taking: Reported on 08/18/2023), Disp: , Rfl:    methocarbamol  (ROBAXIN ) 500 MG tablet, Take 1 tablet (500 mg total) by mouth 4 (four) times daily as needed for muscle spasms., Disp: 28 tablet, Rfl: 0   methocarbamol  (ROBAXIN ) 500 MG tablet, Take 1 tablet (500 mg total) by mouth 4 (four) times daily., Disp: 20 tablet, Rfl: 0   nitroGLYCERIN   (NITROSTAT ) 0.4 MG SL tablet, Place 1 tablet (0.4 mg total) under the tongue every 5 (five) minutes as needed for chest pain., Disp: 25 tablet, Rfl: 1   ranolazine  (RANEXA ) 1000 MG SR tablet, Take 1 tablet (1,000 mg total) by mouth 2 (two) times daily., Disp: 60 tablet, Rfl: 11   rivaroxaban  (XARELTO ) 20 MG TABS tablet, Take 1 tablet (20 mg total) by mouth daily with supper., Disp: 30 tablet, Rfl: 0  Vitals   Vitals:   11/10/2023 0904 November 10, 2023 0915 11-10-23 1030 11/10/2023 1145  BP: (!) 140/98 136/88 (!) 160/98 (!) 160/101  Pulse: 70   64  Resp: 13 19 14 16   Temp: 98.1 F (36.7 C)   98.1 F (36.7 C)  TempSrc: Oral   Oral  SpO2:    98%  Weight:      Height:        Body mass index is 26.31 kg/m.   Physical Exam   Constitutional: Appears well-developed and well-nourished.  Psych: Affect appropriate to situation.  Eyes: No scleral injection.  HENT: No OP obstruction.  Head: Normocephalic.  Cardiovascular: Normal rate and regular rhythm.  Respiratory: Effort normal, non-labored breathing.  GI: Soft.  No distension. There is no tenderness.  Skin: WDI.   Neurologic Examination   Neuro: *Ment: A&O x4. Follows multi-step commands.  *Speech: no dysarthria or aphasia, able to name and repeat. *CN:    I: Deferred   II,III: PERRLA, VFF by confrontation, optic discs not visualized 2/2 pupillary constriction   III,IV,VI: EOMI w/o nystagmus, no ptosis   V: Sensation intact from V1 to V3 to LT   VII: Eyelid closure was full.  Smile symmetric.   VIII: Hearing intact to voice   IX,X: Voice normal, palate elevates symmetrically    XI: SCM/trap 5/5 bilat   XII: Tongue protrudes midline, no atrophy or fasciculations  *Motor:   Normal bulk.  No tremor, rigidity or bradykinesia. No pronator drift.   Strength: Dlt Bic Tri WE WrF FgS Gr HF KnF KnE PlF DoF    Left 4 4 4 4 4 4 5 5 5 4 4 4     Right 4 4 4 4 4 3 3 4 5 4 3 4    *Sensory: Decreased sensation to fine touch on RUE and RLE *Reflexes:   2+ and symmetric throughout without clonus; toes down-going bilat *Gait: deferred   Labs/Imaging/Neurodiagnostic studies   CBC:  Recent Labs  Lab 11-10-23 0859  WBC 5.9  NEUTROABS 3.7  HGB 14.3  HCT 43.6  MCV 94.4  PLT 211   Basic Metabolic Panel:  Lab Results  Component Value Date   NA  138 10/13/2023   K 4.4 10/13/2023   CO2 20 (L) 10/13/2023   GLUCOSE 91 10/13/2023   BUN 19 10/13/2023   CREATININE 1.62 (H) 10/13/2023   CALCIUM  10.1 10/13/2023   GFRNONAA 49 (L) 10/13/2023   GFRAA 99 11/13/2019   Lipid Panel:  Lab Results  Component Value Date   LDLCALC 173 (H) 06/16/2023   HgbA1c:  Lab Results  Component Value Date   HGBA1C 5.3 11/29/2020   Urine Drug Screen:     Component Value Date/Time   LABOPIA NONE DETECTED 06/15/2023 2150   COCAINSCRNUR POSITIVE (A) 06/15/2023 2150   LABBENZ NONE DETECTED 06/15/2023 2150   AMPHETMU NONE DETECTED 06/15/2023 2150   THCU POSITIVE (A) 06/15/2023 2150   LABBARB NONE DETECTED 06/15/2023 2150    Alcohol Level No results found for: [facility] INR  Lab Results  Component Value Date   INR 1.0 09/21/2022   APTT  Lab Results  Component Value Date   APTT 30 09/21/2022   MRI of brain: 1. Positive for punctate acute cortical infarct in the left superior perirolandic sensory area, concordant with right upper extremity numbness. No hemorrhage or mass effect.  2. Underlying advanced but nonspecific cerebral white matter signal abnormality. Mild signal heterogeneity in the pons. Main differential considerations of advanced small vessel disease versus chronic demyelinating disease.  3. Superimposed posttraumatic appearing encephalomalacia left superior temporal gyrus. And punctate microhemorrhage in the cerebellar vermis.  CTA of head and neck: 1. Occlusion of a proximal M2 branch of the left MCA with reconstitution within the posterior aspect of the sylvian fissure. The MCAs are otherwise patent. 2. Atherosclerosis as  above. No hemodynamically significant stenosis in the neck. 3. Mild stenosis of the distal P2 segment of the right PCA. 4. Mild stenosis of the carotid siphons. 5. Medially directed outpouching along the posterior aspect of the left cavernous ICA measuring 3 x 2 mm, concerning for aneurysm.  ASSESSMENT   ELIZAR ALPERN is a 58 y.o. male with a past medical history as documented above who is presenting with acute chest pain and right sided numbness, found to have an acute cortical infarct in the left superior perirolandic sensory area. He does have focal deficit of right upper extremity numbness which seems to be consistent with where the stroke is. He was previously taking xarelto  and aspirin  at home for his CAD, and plavix  seems to have been self discontinued in 08/2022 for an unknown reason, with last fill history in 2023. As mentioned previously he has not been compliant with his medications. Stroke risk factors include recent cocaine use, HLD, and uncontrolled HTN.  Given tht his symptoms have been going on for about three weeks now, will do antiplatelet monotherapy with aspirin  81mg   RECOMMENDATIONS  - Aspirin  81 mg po qd - Echocardiogram  - PT/OT/Speech - Risk Factor Modification: Lipid Panel, statin if indicated (last LDL 173 in 4/25), A1c  - Substance abuse counseling (Cocaine, tobacco) - HgbA1c - Cardiac telemetry - Statin - BP management per standard protocol. Out of the permissive HTN time window - NPO until passes stroke swallow screen ______________________________________________________________________    Signed, Roetta Chars, MD  Internal Medicine Resident, PGY-3 [facility]  I have seen and examined the patient. I have formulated the assessment and recommendations. 58 y.o. male with hx of asthma, anxiety, CAD w/ previous MI and CABG, HLD, thoracic aortic dilation, substance abuse disorder who presented to the emergency department for chest pain that has  been going on for about  a week, worsened last night with the use of cocaine. He states that he has been having intermittent chest pain with exertion chronically, but this morning while eating his breakfast he was getting diaphoretic and had crushing pain in his chest located substernally. He also had one short episode of aphasia, where his friends could not understand what he was saying. He also has had right arm numbness for the last week or so, and during this time he feels like he randomly drops objects that are in his right arm. MRI reveals an acute punctate acute cortical infarct in the left superior perirolandic sensory area, concordant with right upper extremity numbness. Exam reveals mild diffuse weakness and decreased sensation to fine touch on RUE and RLE. CTA with occlusion of a proximal M2 branch of the left MCA with reconstitution within the posterior aspect of the sylvian fissure. Out of the TNK and thrombectomy time windows. Recommendations as above.  Electronically signed: Dr. Brandin Dilday

## 2023-10-13 NOTE — ED Provider Triage Note (Deleted)
 Emergency Medicine Provider Triage Evaluation Note  Benjamin Espinoza , a 58 y.o. male  was evaluated in triage.  Pt complains of tingling pain stabbing pain in her legs and whole body.  Review of Systems  Positive: Pain Negative: Fever  Physical Exam  There were no vitals taken for this visit. Gen:   Awake, no distress   Resp:  Normal effort  MSK:   Significant edema in her lower extremities with bilateral Ace wraps Other:    Medical Decision Making  Medically screening exam initiated at 8:49 AM.  Appropriate orders placed.  Benjamin Espinoza was informed that the remainder of the evaluation will be completed by another provider, this initial triage assessment does not replace that evaluation, and the importance of remaining in the ED until their evaluation is complete.     Benjamin Ozell BROCKS, MD 10/13/23 [phone]
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
## 2023-10-13 NOTE — Discharge Instructions (Signed)
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
## 2023-10-13 NOTE — ED Notes (Signed)
 Patient transported to MRI

## 2023-10-13 NOTE — ED Notes (Signed)
 CCMD called by this RN

## 2023-10-13 NOTE — ED Provider Triage Note (Signed)
 Emergency Medicine Provider Triage Evaluation Note  Benjamin Espinoza , a 58 y.o. male  was evaluated in triage.  Pt complains of chest pain dizziness, pain in his neck and numbness in his right arm.  Review of Systems  Positive: Chest pain Negative: Abdominal pain  Physical Exam  BP (!) 125/101   Pulse 95   Temp 98.1 F (36.7 C)   Resp 18   Ht 6' (1.829 m)   Wt 88 kg   SpO2 98%   BMI 26.31 kg/m  Gen:   Awake, no distress   Resp:  Normal effort  MSK:   Moves extremities without difficulty  Other:    Medical Decision Making  Medically screening exam initiated at 8:57 AM.  Appropriate orders placed.  Benjamin Espinoza was informed that the remainder of the evaluation will be completed by another provider, this initial triage assessment does not replace that evaluation, and the importance of remaining in the ED until their evaluation is complete.     Benjamin Ozell BROCKS, MD 10/13/23 [phone]

## 2023-10-14 ENCOUNTER — Other Ambulatory Visit (HOSPITAL_COMMUNITY)

## 2023-10-14 DIAGNOSIS — I739 Peripheral vascular disease, unspecified: Secondary | ICD-10-CM

## 2023-10-14 DIAGNOSIS — I77819 Aortic ectasia, unspecified site: Secondary | ICD-10-CM

## 2023-10-14 DIAGNOSIS — I69351 Hemiplegia and hemiparesis following cerebral infarction affecting right dominant side: Secondary | ICD-10-CM

## 2023-10-14 DIAGNOSIS — Z79899 Other long term (current) drug therapy: Secondary | ICD-10-CM

## 2023-10-14 DIAGNOSIS — F1210 Cannabis abuse, uncomplicated: Secondary | ICD-10-CM

## 2023-10-14 DIAGNOSIS — Z7982 Long term (current) use of aspirin: Secondary | ICD-10-CM

## 2023-10-14 DIAGNOSIS — Z951 Presence of aortocoronary bypass graft: Secondary | ICD-10-CM

## 2023-10-14 DIAGNOSIS — F1410 Cocaine abuse, uncomplicated: Secondary | ICD-10-CM

## 2023-10-14 DIAGNOSIS — N179 Acute kidney failure, unspecified: Secondary | ICD-10-CM

## 2023-10-14 DIAGNOSIS — R29701 NIHSS score 1: Secondary | ICD-10-CM | POA: Diagnosis not present

## 2023-10-14 DIAGNOSIS — F17210 Nicotine dependence, cigarettes, uncomplicated: Secondary | ICD-10-CM

## 2023-10-14 DIAGNOSIS — I5030 Unspecified diastolic (congestive) heart failure: Secondary | ICD-10-CM

## 2023-10-14 DIAGNOSIS — R0789 Other chest pain: Secondary | ICD-10-CM

## 2023-10-14 DIAGNOSIS — I110 Hypertensive heart disease with heart failure: Secondary | ICD-10-CM

## 2023-10-14 DIAGNOSIS — I2510 Atherosclerotic heart disease of native coronary artery without angina pectoris: Secondary | ICD-10-CM

## 2023-10-14 DIAGNOSIS — M542 Cervicalgia: Secondary | ICD-10-CM

## 2023-10-14 DIAGNOSIS — I69391 Dysphagia following cerebral infarction: Secondary | ICD-10-CM

## 2023-10-14 DIAGNOSIS — Z720 Tobacco use: Secondary | ICD-10-CM

## 2023-10-14 DIAGNOSIS — I6389 Other cerebral infarction: Secondary | ICD-10-CM | POA: Diagnosis not present

## 2023-10-14 LAB — CBC
HCT: 41.1 % (ref 39.0–52.0)
Hemoglobin: 13.7 g/dL (ref 13.0–17.0)
MCH: 31.4 pg (ref 26.0–34.0)
MCHC: 33.3 g/dL (ref 30.0–36.0)
MCV: 94.3 fL (ref 80.0–100.0)
Platelets: 203 K/uL (ref 150–400)
RBC: 4.36 MIL/uL (ref 4.22–5.81)
RDW: 14.3 % (ref 11.5–15.5)
WBC: 5.6 K/uL (ref 4.0–10.5)
nRBC: 0 % (ref 0.0–0.2)

## 2023-10-14 LAB — COMPREHENSIVE METABOLIC PANEL WITH GFR
ALT: 14 U/L (ref 0–44)
AST: 23 U/L (ref 15–41)
Albumin: 3 g/dL — ABNORMAL LOW (ref 3.5–5.0)
Alkaline Phosphatase: 60 U/L (ref 38–126)
Anion gap: 9 (ref 5–15)
BUN: 25 mg/dL — ABNORMAL HIGH (ref 6–20)
CO2: 23 mmol/L (ref 22–32)
Calcium: 9.5 mg/dL (ref 8.9–10.3)
Chloride: 106 mmol/L (ref 98–111)
Creatinine, Ser: 1.66 mg/dL — ABNORMAL HIGH (ref 0.61–1.24)
GFR, Estimated: 48 mL/min — ABNORMAL LOW (ref >60)
Glucose, Bld: 90 mg/dL (ref 70–99)
Potassium: 4.2 mmol/L (ref 3.5–5.1)
Sodium: 138 mmol/L (ref 135–145)
Total Bilirubin: 0.7 mg/dL (ref 0.0–1.2)
Total Protein: 6.8 g/dL (ref 6.5–8.1)

## 2023-10-14 MED ORDER — NICOTINE 21 MG/24HR TD PT24
21.0000 mg | MEDICATED_PATCH | Freq: Every day | TRANSDERMAL | Status: DC
  Administered 2023-10-14 – 2023-10-18 (×4): 21 mg via TRANSDERMAL
  Filled 2023-10-14 (×5): qty 1

## 2023-10-14 NOTE — Plan of Care (Signed)

## 2023-10-14 NOTE — Progress Notes (Addendum)
 STROKE TEAM PROGRESS NOTE   INTERIM HISTORY/SUBJECTIVE  No family at the bedside.  Patient is awake alert laying in the bed in no apparent distress.  He states that for over a week he has been having some chest pain and some right arm numbness with sharp neck pains on the right side.  He also had an episode of trouble with speaking about a week ago.  Yesterday he states that he had chest pain and was lightheaded and he fell.  He states he has not taken his medicines in over a month MRI brain with Positive for punctate acute cortical infarct in the left superior perirolandic sensory area CT angiogram shows left M2 branch occlusion  CBC    Component Value Date/Time   WBC 5.6 10/14/2023 0426   RBC 4.36 10/14/2023 0426   HGB 13.7 10/14/2023 0426   HGB 14.2 04/09/2021 0904   HCT 41.1 10/14/2023 0426   HCT 41.6 04/09/2021 0904   PLT 203 10/14/2023 0426   PLT 231 04/09/2021 0904   MCV 94.3 10/14/2023 0426   MCV 92 04/09/2021 0904   MCH 31.4 10/14/2023 0426   MCHC 33.3 10/14/2023 0426   RDW 14.3 10/14/2023 0426   RDW 13.1 04/09/2021 0904   LYMPHSABS 1.6 10/13/2023 0859   LYMPHSABS 2.1 04/09/2021 0904   MONOABS 0.6 10/13/2023 0859   EOSABS 0.1 10/13/2023 0859   EOSABS 0.1 04/09/2021 0904   BASOSABS 0.0 10/13/2023 0859   BASOSABS 0.1 04/09/2021 0904    BMET    Component Value Date/Time   NA 138 10/14/2023 0426   NA 141 04/09/2021 0904   K 4.2 10/14/2023 0426   CL 106 10/14/2023 0426   CO2 23 10/14/2023 0426   GLUCOSE 90 10/14/2023 0426   BUN 25 (H) 10/14/2023 0426   BUN 13 04/09/2021 0904   CREATININE 1.66 (H) 10/14/2023 0426   CALCIUM  9.5 10/14/2023 0426   EGFR 68 04/09/2021 0904   GFRNONAA 48 (L) 10/14/2023 0426    IMAGING past 24 hours No results found.  Vitals:   10/14/23 0449 10/14/23 0638 10/14/23 0809 10/14/23 1230  BP: (!) 89/57 102/70 116/76 100/72  Pulse: 66  70 73  Resp: 14 14 16 18   Temp: 98.5 F (36.9 C) 98.4 F (36.9 C) 98.2 F (36.8 C) 98.2 F (36.8  C)  TempSrc: Oral Oral Oral Oral  SpO2: 97% 98% 99% 99%  Weight:      Height:         PHYSICAL EXAM General:  Alert, well-nourished, well-developed patient in no acute distress Psych:  Mood and affect appropriate for situation CV: Regular rate and rhythm on monitor Respiratory:  Regular, unlabored respirations on room air GI: Abdomen soft and nontender   NEURO:  Mental Status: AA&Ox3, patient is able to give clear and coherent history Speech/Language: speech is without dysarthria or aphasia.  Naming, repetition, fluency, and comprehension intact.  Cranial Nerves:  II: PERRL. Visual fields full.  III, IV, VI: EOMI. Eyelids elevate symmetrically.  V: Sensation is intact to light touch and symmetrical to face.  VII: Face is symmetrical resting and smiling VIII: hearing intact to voice. IX, X: Palate elevates symmetrically. Phonation is normal.  KP:Dynloizm shrug 5/5. XII: tongue is midline without fasciculations. Motor: 5/5 strength to all muscle groups tested.  Tone: is normal and bulk is normal Sensation-decrease sensation on right hemibody Coordination: FTN intact bilaterally, HKS: no ataxia in BLE.No drift.  Gait- deferred  Most Recent NIH   1a Level of  Conscious.:  1b LOC Questions:  1c LOC Commands:  2 Best Gaze:  3 Visual:  4 Facial Palsy:  5a Motor Arm - left:  5b Motor Arm - Right:  6a Motor Leg - Left:  6b Motor Leg - Right:  7 Limb Ataxia:  8 Sensory: 1 with split from midline and includes the forehead.  Splits midline forehead for vibration 9 Best Language:  10 Dysarthria:  11 Extinct. and Inatten.:  TOTAL: 1   ASSESSMENT/PLAN  [name] is a 58 y.o. male with history of  PAD s/p right vein replacement, CAD s/p 4 vessel CABG 2017, HFrEF, HTN, HLD, aortic insufficiency, CKDII/IIIa, and cocaine use disorder admitted for chest pain that has been going on for about a week, worsened last night with the use of cocaine. He states that he has  been having intermittent chest pain with exertion chronically, but this morning while eating his breakfast he was getting diaphoretic and had crushing pain in his chest located substernally. He also had one short episode of aphasia, where his friends could not understand what he was saying. He also has had right arm numbness for the last week or so, and during this time he feels like he randomly drops objects that are in his right arm. The numbness he feels like is coming from the right side of his neck, and he describes it as if someone hit my funny bone.  Acute Ischemic Infarct:  left punctate acute cortical infarct in the left superior perirolandic sensory area  Etiology:  cocaine use and uncontrolled risk factors  CTA head & neck Occlusion of a proximal M2 branch of the left MCA with reconstitution within the posterior aspect of the sylvian fissure. Mild stenosis of the distal P2 segment of the right PCA. Mild stenosis of the carotid siphons. Medially directed outpouching along the posterior aspect of the left cavernous ICA measuring 3 x 2 mm, concerning for aneurysm MRI  Positive for punctate acute cortical infarct in the left superior perirolandic sensory area.  Superimposed posttraumatic appearing encephalomalacia left superior temporal gyrus. And punctate microhemorrhage in the cerebellar vermis 2D Echo ordered LDL 193 HgbA1c 5.4 VTE prophylaxis - SCD's aspirin  81 mg daily, Xarelto  (rivaroxaban ) daily, and cilostazol  (had not taken Xarelto  for over 1 month) prior to admission, now on aspirin  81 mg daily and Xarelto  (rivaroxaban ) daily  and cilostazol  Therapy recommendations:  Pending Disposition:  pending   Hypertension HFmEF CAD MI s/p CABG Ischemic cardiomyopathy Home meds: 6.25 mg, Imdur  120 mg, losartan  50 mg Stable Blood Pressure Goal: SBP less than 160   Hyperlipidemia Home meds:  atorvastatin  80mg ,  resumed in hospital LDL 193, goal < 70 Continue statin at  discharge  Tobacco Abuse Patient smokes cigarette everyday      Ready to quit? No Nicotine  replacement therapy provided  Substance Abuse Patient uses cocaine and marijuana  UDS ordered      Ready to quit? No TOC consult for cessation placed  Dysphagia Patient has post-stroke dysphagia, SLP consulted    Diet   Diet Heart Room service appropriate? Yes; Fluid consistency: Thin   Advance diet as tolerated  Other Stroke Risk Factors  ETOH use,, advised to drink no more than 2 drink(s) a day Coronary artery disease Congestive heart failure    Other Active Problems PVD PAD OAB Asthma  Anxiety and depression Homeless  Hospital day # 1  I have personally obtained history,examined this patient, reviewed notes, independently viewed imaging studies, participated in  medical decision making and plan of care.ROS completed by me personally and pertinent positives fully documented  I have made any additions or clarifications directly to the above note. Agree with note above.  Patient presented with multifocal symptoms chest pain remained presenting symptom but does endorse symptoms of right-sided numbness, neck weakness as well as some speech difficulties about a week ago.  MRI scan shows punctate left perirolandic cortical infarct with CT angiogram showing left M2 occlusion.  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  Patient not been compliant with his home medications.  Recommend to resume Xarelto , aspirin  and cilostazol  for his severe peripheral vascular disease.  Continue ongoing stroke workup and aggressive risk factor modification.  Patient counseled to quit using cocaine and smoking cigarettes and drinking.  No family available at the bedside.   I personally spent a total of 50 minutes in the care of the patient today including getting/reviewing separately obtained history, performing a medically appropriate exam/evaluation, counseling and educating,  placing orders, referring and communicating with other health care professionals, documenting clinical information in the EHR, independently interpreting results, and coordinating care.        Eather Popp, MD Medical Director [facility] Pager: [phone] 10/14/2023 2:19 PM   Karna Geralds DNP, ACNPC-AG  [facility]    To contact Stroke Continuity provider, please refer to [email or web address]. After hours, contact General Neurology

## 2023-10-14 NOTE — Evaluation (Signed)
 Physical Therapy Evaluation Patient Details Name: Benjamin Espinoza MRN: [id number] DOB: Mar 01, 1966 Today's Date: 10/14/2023  History of Present Illness  Pt is a 58 y/o male presenting on 8/14 with L sided chest pain, R UE numbness and dizziness. MRI with punctate acute cortical infarct in L superior perirolandic sensory area. PMH includes: CAD s/p CABG 2017 and 2021, PAD s/p R fem/pop bypass, cocaine abuse, DDD, anxiety, non compliance with medications.   Clinical Impression  Pt in bed upon arrival and agreeable to PT eval. Pt was ambulating with a SP cane months prior, however, he had been ambulating with no AD due to misplacing cane. Pt reports one recent fall due to R knee buckling. Pt presents with dizziness at rest and with mobility with impaired balance. Required MinA to stand and ambulate 34ft with SP cane. Multiple small losses of balance with lateral side step recovery and MinA to correct. Recommending post-acute rehab <3hrs to work towards independence with mobility. If pt is unable to get further rehab, would recommend OP PT. Pt would benefit from acute skilled PT with current functional limitations listed below (see PT Problem List). Acute PT to follow.  Seated BP- 133/81 (95), 70 BPM        If plan is discharge home, recommend the following: A little help with walking and/or transfers;Assist for transportation;Help with stairs or ramp for entrance   Can travel by private vehicle   Yes    Equipment Recommendations Other (comment) (RW vs SP cane)     Functional Status Assessment Patient has had a recent decline in their functional status and demonstrates the ability to make significant improvements in function in a reasonable and predictable amount of time.     Precautions / Restrictions Precautions Precautions: Fall      Mobility  Bed Mobility Overal bed mobility: Needs Assistance Bed Mobility: Supine to Sit    Supine to sit: Supervision, HOB elevated        Transfers Overall transfer level: Needs assistance Equipment used: Straight cane Transfers: Sit to/from Stand Sit to Stand: Min assist   General transfer comment: MinA for slight steadying assist with rise    Ambulation/Gait Ambulation/Gait assistance: Min assist Gait Distance (Feet): 20 Feet Assistive device: Straight cane Gait Pattern/deviations: Step-through pattern, Decreased stride length, Staggering left, Staggering right, Trunk flexed Gait velocity: decr     General Gait Details: unsteady with pt reaching for UE support with x3 small LOB. Lateral side-step recovery with MinA to correct. Felt dizzy at rest and during gait  Modified Rankin (Stroke Patients Only) Modified Rankin (Stroke Patients Only) Pre-Morbid Rankin Score: No symptoms Modified Rankin: Moderately severe disability     Balance Overall balance assessment: Mild deficits observed, not formally tested, History of Falls          Pertinent Vitals/Pain Pain Assessment Pain Assessment: Faces Faces Pain Scale: Hurts little more Pain Location: headache, chest Pain Descriptors / Indicators: Aching, Discomfort Pain Intervention(s): Limited activity within patient's tolerance, Monitored during session, Repositioned    Home Living Family/patient expects to be discharged to:: Shelter/Homeless Living Arrangements: Non-relatives/Friends      Additional Comments: trying to get back to [place]    Prior Function Prior Level of Function : Independent/Modified Independent;History of Falls (last six months)    Mobility Comments: used to be ModI with SPC, however pt lost his cane and has been managing with no AD. x1 fall due to R knee giving out ADLs Comments: ind,     Extremity/Trunk Assessment  Upper Extremity Assessment Upper Extremity Assessment: Defer to OT evaluation    Lower Extremity Assessment Lower Extremity Assessment: RLE deficits/detail RLE Deficits / Details: 5/5 on MMT with pain in R knee  with resisted knee ext>ankle DF. Hx of knee buckling, not evident during gait RLE Sensation: WNL    Cervical / Trunk Assessment Cervical / Trunk Assessment: Normal  Communication   Communication Communication: No apparent difficulties    Cognition Arousal: Alert Behavior During Therapy: WFL for tasks assessed/performed   PT - Cognitive impairments: No apparent impairments    Following commands: Intact       Cueing Cueing Techniques: Verbal cues     General Comments General comments (skin integrity, edema, etc.): VSS on RA     PT Assessment Patient needs continued PT services  PT Problem List Decreased activity tolerance;Decreased balance;Decreased mobility       PT Treatment Interventions DME instruction;Gait training;Stair training;Functional mobility training;Therapeutic exercise;Therapeutic activities;Neuromuscular re-education;Balance training;Patient/family education    PT Goals (Current goals can be found in the Care Plan section)  Acute Rehab PT Goals Patient Stated Goal: to not need an assistive device to walk PT Goal Formulation: With patient Time For Goal Achievement: 10/28/23 Potential to Achieve Goals: Good    Frequency Min 2X/week        AM-PAC PT 6 Clicks Mobility  Outcome Measure Help needed turning from your back to your side while in a flat bed without using bedrails?: A Little Help needed moving from lying on your back to sitting on the side of a flat bed without using bedrails?: A Little Help needed moving to and from a bed to a chair (including a wheelchair)?: A Little Help needed standing up from a chair using your arms (e.g., wheelchair or bedside chair)?: A Little Help needed to walk in hospital room?: A Little Help needed climbing 3-5 steps with a railing? : A Lot 6 Click Score: 17    End of Session Equipment Utilized During Treatment: Gait belt Activity Tolerance: Patient tolerated treatment well Patient left: in bed;with call  bell/phone within reach Nurse Communication: Mobility status PT Visit Diagnosis: Unsteadiness on feet (R26.81);Other abnormalities of gait and mobility (R26.89);History of falling (Z91.81)    Time: 9169-9152 PT Time Calculation (min) (ACUTE ONLY): 17 min   Charges:   PT Evaluation $PT Eval Low Complexity: 1 Low   PT General Charges $$ ACUTE PT VISIT: 1 Visit       Kate ORN, PT, DPT Secure Chat Preferred  Rehab Office [phone]   Kate BRAVO Wendolyn 10/14/2023, 9:42 AM

## 2023-10-14 NOTE — NC FL2 (Signed)
 [place]  MEDICAID FL2 LEVEL OF CARE FORM     IDENTIFICATION  Patient Name: Benjamin Espinoza Birthdate: 10-May-1965 Sex: male Admission Date (Current Location): 10/13/2023  [facility] [place] and [place] Number:  [employer] and Address:  [facility], [address] [place], [place] [address]      Provider Number: [id number]  Attending Physician Name and Address:  Lovie Clarity, MD  Relative Name and Phone Number:       Current Level of Care: Hospital Recommended Level of Care: Skilled Nursing Facility Prior Approval Number:    Date Approved/Denied:   PASRR Number: [id number]  Discharge Plan: SNF    Current Diagnoses: Patient Active Problem List   Diagnosis Date Noted   Acute CVA (cerebrovascular accident) (HCC) 10/13/2023   Chronic kidney disease, stage 3a (HCC) 10/13/2023   Aortic dilatation (HCC) 10/13/2023   Lumbar radiculopathy 07/05/2023   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   Heart failure with mid-range ejection fraction (HFmEF) (HCC) 06/17/2023   Cocaine abuse (HCC) 06/16/2023   Unhoused person 06/16/2023   Chest pain  Unstable angina 06/15/2023   Chronic health problem 06/15/2023   Unstable angina (HCC) 06/15/2023   Encounter for well adult exam with abnormal findings 04/08/2023   Chronic pain syndrome 03/29/2023   Acute left ankle pain 03/23/2023   Impaired mobility and ADLs 03/23/2023   Right leg pain 03/23/2023   Chronic bilateral low back pain with left-sided sciatica 01/11/2023   OAB (overactive bladder) 01/11/2023   Encounter for medication monitoring 01/10/2023   PAD (peripheral artery disease) (HCC) 09/21/2022   Ischemic cardiomyopathy 12/17/2020   NSTEMI (non-ST elevated myocardial infarction) (HCC) 11/28/2020   Anxiety    Asthma    Coronary artery disease    Sinus bradycardia 11/29/2019   Abnormal stress test    Shortness of breath    DDD (degenerative disc disease), lumbar 11/08/2019   Essential  hypertension    Hx of CABG    Hyperlipidemia    Myocardial infarction (HCC)    Tobacco use disorder    PVD (peripheral vascular disease) (HCC) 09/21/2019   Atypical chest pain 05/07/2017    Orientation RESPIRATION BLADDER Height & Weight     Self, Time, Situation, Place  Normal Continent Weight: 194 lb (88 kg) Height:  6' (182.9 cm)  BEHAVIORAL SYMPTOMS/MOOD NEUROLOGICAL BOWEL NUTRITION STATUS      Continent Diet (heart healthy)  AMBULATORY STATUS COMMUNICATION OF NEEDS Skin   Limited Assist Verbally Normal                       Personal Care Assistance Level of Assistance  Bathing, Feeding, Dressing Bathing Assistance: Limited assistance Feeding assistance: Independent Dressing Assistance: Limited assistance     Functional Limitations Info             SPECIAL CARE FACTORS FREQUENCY  OT (By licensed OT), PT (By licensed PT)     PT Frequency: 5x/wk OT Frequency: 5x/wk            Contractures Contractures Info: Not present    Additional Factors Info  Code Status, Allergies Code Status Info: Full Allergies Info: NKA           Current Medications (10/14/2023):  This is the current hospital active medication list Current Facility-Administered Medications  Medication Dose Route Frequency Provider Last Rate Last Admin   acetaminophen  (TYLENOL ) tablet 650 mg  650 mg Oral Q6H PRN Tobie Gaines, DO   650  mg at 10/14/23 1105   Or   acetaminophen  (TYLENOL ) suppository 650 mg  650 mg Rectal Q6H PRN Tobie Gaines, DO       aspirin  EC tablet 81 mg  81 mg Oral Daily Nooruddin, Saad, MD   81 mg at 10/14/23 1058   atorvastatin  (LIPITOR ) tablet 80 mg  80 mg Oral Daily Tobie Gaines, DO   80 mg at 10/14/23 1058   carvedilol  (COREG ) tablet 6.25 mg  6.25 mg Oral BID WC Patel, Amar, DO   6.25 mg at 10/14/23 0859   cilostazol  (PLETAL ) tablet 100 mg  100 mg Oral BID Tobie Gaines, DO   100 mg at 10/14/23 1059   diclofenac  Sodium (VOLTAREN ) 1 % topical gel 4 g  4 g Topical QID  Tobie Gaines, DO   4 g at 10/13/23 8151   gabapentin  (NEURONTIN ) capsule 300 mg  300 mg Oral TID Patel, Amar, DO   300 mg at 10/14/23 1059   isosorbide  mononitrate (IMDUR ) 24 hr tablet 120 mg  120 mg Oral Daily Tobie Gaines, DO   120 mg at 10/14/23 1058   lidocaine  (LIDODERM ) 5 % 1 patch  1 patch Transdermal Q24H Tobie Gaines, DO   1 patch at 10/13/23 1539   losartan  (COZAAR ) tablet 50 mg  50 mg Oral Daily Patel, Amar, DO   50 mg at 10/14/23 1058   methocarbamol  (ROBAXIN ) tablet 500 mg  500 mg Oral QID PRN Patel, Amar, DO   500 mg at 10/13/23 1845   nicotine  (NICODERM CQ  - dosed in mg/24 hours) patch 21 mg  21 mg Transdermal Daily Lyles, Elliott, DO       Oral care mouth rinse  15 mL Mouth Rinse PRN Machen, Julie, MD       ranolazine  (RANEXA ) 12 hr tablet 1,000 mg  1,000 mg Oral BID Tobie Gaines, DO   1,000 mg at 10/14/23 1058   rivaroxaban  (XARELTO ) tablet 20 mg  20 mg Oral Q supper Tobie Gaines, DO   20 mg at 10/13/23 1845     Discharge Medications: Please see discharge summary for a list of discharge medications.  Relevant Imaging Results:  Relevant Lab Results:   Additional Information SS#: [id number]  Almarie CHRISTELLA Goodie, LCSW

## 2023-10-14 NOTE — TOC Initial Note (Signed)
 Transition of Care [facility]) - Initial/Assessment Note    Patient Details  Name: Benjamin Espinoza MRN: [id number] Date of Birth: [birth year]  Transition of Care [facility]) CM/SW Contact:    Almarie CHRISTELLA Goodie, LCSW Phone Number: 10/14/2023, 3:08 PM  Clinical Narrative:       CSW met with patient to discuss recommendation for SNF. Patient in agreement, says he really needs to get himself healthy. CSW discussed barriers to placement, including his cocaine use, and patient indicated understanding. Patient discussed feeling very motivated to quit, and that he had been doing well not using until he surrounded himself by people who use again, then he was back at it. Patient also worried that he has lost his phone and his wallet, and all of his belongings are back at the shelter in [place].   CSW to fax out referral, will follow up with any bed offers.            Expected Discharge Plan: Skilled Nursing Facility Barriers to Discharge: Homeless with medical needs, Continued Medical Work up, Active Substance Use - Placement   Patient Goals and CMS Choice Patient states their goals for this hospitalization and ongoing recovery are:: to get back into better health, take better care of himself CMS Medicare.gov Compare Post Acute Care list provided to:: Patient Choice offered to / list presented to : Patient [place] ownership interest in [facility].provided to:: Patient    Expected Discharge Plan and Services     Post Acute Care Choice: Skilled Nursing Facility Living arrangements for the past 2 months: Homeless Shelter                                      Prior Living Arrangements/Services Living arrangements for the past 2 months: Homeless Shelter Lives with:: Self Patient language and need for interpreter reviewed:: No Do you feel safe going back to the place where you live?: Yes      Need for Family Participation in Patient Care: No (Comment) Care giver support system  in place?: No (comment)   Criminal Activity/Legal Involvement Pertinent to Current Situation/Hospitalization: No - Comment as needed  Activities of Daily Living   ADL Screening (condition at time of admission) Independently performs ADLs?: Yes (appropriate for developmental age) Is the patient deaf or have difficulty hearing?: No Does the patient have difficulty seeing, even when wearing glasses/contacts?: No Does the patient have difficulty concentrating, remembering, or making decisions?: No  Permission Sought/Granted Permission sought to share information with : Facility Industrial/product designer granted to share information with : Yes, Verbal Permission Granted     Permission granted to share info w AGENCY: SNF        Emotional Assessment Appearance:: Appears stated age Attitude/Demeanor/Rapport: Engaged Affect (typically observed): Appropriate Orientation: : Oriented to Self, Oriented to Place, Oriented to  Time, Oriented to Situation Alcohol / Substance Use: Illicit Drugs Psych Involvement: No (comment)  Admission diagnosis:  Numbness [R20.0] Atypical chest pain [R07.89] Acute CVA (cerebrovascular accident) [facility]) [I63.9] Cerebrovascular accident (CVA), unspecified mechanism (HCC) [I63.9] Patient Active Problem List   Diagnosis Date Noted   Acute CVA (cerebrovascular accident) (HCC) 10/13/2023   Chronic kidney disease, stage 3a (HCC) 10/13/2023   Aortic dilatation (HCC) 10/13/2023   Lumbar radiculopathy 07/05/2023   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   Heart failure with mid-range ejection fraction (HFmEF) (HCC) 06/17/2023   Cocaine abuse (HCC) 06/16/2023  Unhoused person 06/16/2023   Chest pain  Unstable angina 06/15/2023   Chronic health problem 06/15/2023   Unstable angina (HCC) 06/15/2023   Encounter for well adult exam with abnormal findings 04/08/2023   Chronic pain syndrome 03/29/2023   Acute left ankle pain 03/23/2023   Impaired mobility  and ADLs 03/23/2023   Right leg pain 03/23/2023   Chronic bilateral low back pain with left-sided sciatica 01/11/2023   OAB (overactive bladder) 01/11/2023   Encounter for medication monitoring 01/10/2023   PAD (peripheral artery disease) (HCC) 09/21/2022   Ischemic cardiomyopathy 12/17/2020   NSTEMI (non-ST elevated myocardial infarction) (HCC) 11/28/2020   Anxiety    Asthma    Coronary artery disease    Sinus bradycardia 11/29/2019   Abnormal stress test    Shortness of breath    DDD (degenerative disc disease), lumbar 11/08/2019   Essential hypertension    Hx of CABG    Hyperlipidemia    Myocardial infarction (HCC)    Tobacco use disorder    PVD (peripheral vascular disease) (HCC) 09/21/2019   Atypical chest pain 05/07/2017   PCP:  Patient, No Pcp Per Pharmacy:   [facility] [address] [place] - [address] [place] [address] Phone: [phone] Fax: [phone]  [place] - [facility] [address] [place] [address] Phone: [phone] Fax: [phone]  [facility] [address] [place] [address] Phone: [phone] Fax: [phone]     Social Drivers of Health (SDOH) Social History: SDOH Screenings   Food Insecurity: Food Insecurity Present (10/13/2023)  Housing: High Risk (10/13/2023)  Transportation Needs: Unmet Transportation Needs (10/13/2023)  Utilities: At Risk (10/13/2023)  Alcohol Screen: Low Risk  (03/25/2023)  Depression (PHQ2-9): Low Risk  (06/09/2023)  Recent Concern: Depression (PHQ2-9) - Medium Risk (03/23/2023)  Financial Resource Strain: High Risk (06/09/2023)  Physical Activity: Inactive (03/25/2023)  Social Connections: Socially Integrated (06/16/2023)  Stress: Stress Concern Present (03/25/2023)  Tobacco Use: High Risk (10/13/2023)  Health Literacy: Adequate Health Literacy (03/25/2023)   SDOH Interventions:     Readmission Risk  Interventions     No data to display

## 2023-10-14 NOTE — TOC CAGE-AID Note (Signed)
 Transition of Care [facility]) - CAGE-AID Screening   Patient Details  Name: Benjamin Espinoza MRN: [id number] Date of Birth: [birth year]  Transition of Care [facility]) CM/SW Contact:    Karam Dunson E Refugio Vandevoorde, LCSW Phone Number: 10/14/2023, 11:08 AM   Clinical Narrative: Patient reports using cocaine at least 4-5 days per week. Patient states he is very interested in resources to help him quit - resources were added to AVS by ED SW.    CAGE-AID Screening:    Have You Ever Felt You Ought to Cut Down on Your Drinking or Drug Use?: Yes Have People Annoyed You By Critizing Your Drinking Or Drug Use?: Yes Have You Felt Bad Or Guilty About Your Drinking Or Drug Use?: Yes Have You Ever Had a Drink or Used Drugs First Thing In The Morning to Steady Your Nerves or to Get Rid of a Hangover?: Yes CAGE-AID Score: 4  Substance Abuse Education Offered: Yes

## 2023-10-14 NOTE — Evaluation (Signed)
 Occupational Therapy Evaluation Patient Details Name: Benjamin Espinoza MRN: [id number] DOB: 05/15/1965 Today's Date: 10/14/2023   History of Present Illness   Pt is a 58 y/o male presenting on 8/14 with L sided chest pain, R UE numbness and dizziness. MRI with punctate acute cortical infarct in L superior perirolandic sensory area. PMH includes: CAD s/p CABG 2017 and 2021, PAD s/p R fem/pop bypass, cocaine abuse, DDD, anxiety, non compliance with medications.     Clinical Impressions PTA patient independent and managing ADLs. Admitted for above and presents with problem list below, including R UE weakness, decreased sensation and FMC, impaired balance and decreased activity tolerance. Pt able to manage Adls with up to contact guard assist, transfers with contact guard assist.  Educated on using RUE with visual input to decreased drop rate, pt verbalized understanding.  He also demonstrates decreased recall, problem solving and attention, but unsure if this is baseline.  Based on performance today, believe patient will best benefit from continued OT services acutely and after dc at inpatient setting with <3hrs/day to optimize independence, safety with ADLs and mobility.      If plan is discharge home, recommend the following:   A little help with walking and/or transfers;A little help with bathing/dressing/bathroom;Assistance with cooking/housework;Assist for transportation;Help with stairs or ramp for entrance;Direct supervision/assist for financial management;Direct supervision/assist for medications management     Functional Status Assessment   Patient has had a recent decline in their functional status and demonstrates the ability to make significant improvements in function in a reasonable and predictable amount of time.     Equipment Recommendations   Other (comment) (defer)     Recommendations for Other Services         Precautions/Restrictions    Precautions Precautions: Fall Restrictions Weight Bearing Restrictions Per Provider Order: No     Mobility Bed Mobility Overal bed mobility: Needs Assistance Bed Mobility: Sit to Supine       Sit to supine: Supervision, HOB elevated        Transfers Overall transfer level: Needs assistance Equipment used: None Transfers: Sit to/from Stand Sit to Stand: Contact guard assist           General transfer comment: min guard for safety, pt pushing from bed and preference to UE support      Balance Overall balance assessment: Mild deficits observed, not formally tested, History of Falls                                         ADL either performed or assessed with clinical judgement   ADL Overall ADL's : Needs assistance/impaired     Grooming: Set up;Sitting           Upper Body Dressing : Set up;Sitting   Lower Body Dressing: Contact guard assist;Sit to/from stand   Toilet Transfer: Contact guard assist           Functional mobility during ADLs: Contact guard assist       Vision   Vision Assessment?: No apparent visual deficits     Perception         Praxis         Pertinent Vitals/Pain Pain Assessment Pain Assessment: Faces Faces Pain Scale: Hurts little more Pain Location: headache, chest Pain Descriptors / Indicators: Aching, Discomfort Pain Intervention(s): Limited activity within patient's tolerance, Monitored during session, Repositioned     Extremity/Trunk  Assessment Upper Extremity Assessment Upper Extremity Assessment: Right hand dominant;RUE deficits/detail RUE Deficits / Details: grossly 3/5 MMT, decreased FMC and reports tingling sensation RUE Sensation: decreased light touch RUE Coordination: decreased fine motor   Lower Extremity Assessment Lower Extremity Assessment: Defer to PT evaluation RLE Deficits / Details: 5/5 on MMT with pain in R knee with resisted knee ext>ankle DF. Hx of knee buckling, not  evident during gait RLE Sensation: WNL   Cervical / Trunk Assessment Cervical / Trunk Assessment: Normal   Communication Communication Communication: No apparent difficulties   Cognition Arousal: Alert Behavior During Therapy: WFL for tasks assessed/performed Cognition: Cognition impaired     Awareness: Online awareness impaired Memory impairment (select all impairments): Short-term memory Attention impairment (select first level of impairment): Selective attention Executive functioning impairment (select all impairments): Sequencing, Problem solving OT - Cognition Comments: parts of short blessed test performed, difficulty with sequencng, attention and recall.                 Following commands: Intact       Cueing  General Comments   Cueing Techniques: Verbal cues  educated on using R UE as much as possible, providing visual input to promote increased functional use   Exercises     Shoulder Instructions      Home Living Family/patient expects to be discharged to:: Shelter/Homeless Living Arrangements: Non-relatives/Friends                               Additional Comments: trying to get back to [place]      Prior Functioning/Environment Prior Level of Function : Independent/Modified Independent;History of Falls (last six months)             Mobility Comments: used to be ModI with SPC, however pt lost his cane and has been managing with no AD. x1 fall due to R knee giving out ADLs Comments: ind,    OT Problem List: Decreased strength;Decreased activity tolerance;Impaired balance (sitting and/or standing);Decreased coordination;Decreased cognition;Decreased safety awareness;Decreased knowledge of use of DME or AE;Decreased knowledge of precautions;Impaired UE functional use;Impaired sensation   OT Treatment/Interventions: Self-care/ADL training;Therapeutic exercise;DME and/or AE instruction;Therapeutic activities;Cognitive  remediation/compensation;Patient/family education;Balance training;Energy conservation      OT Goals(Current goals can be found in the care plan section)   Acute Rehab OT Goals Patient Stated Goal: get better OT Goal Formulation: With patient Time For Goal Achievement: 10/28/23 Potential to Achieve Goals: Good   OT Frequency:  Min 2X/week    Co-evaluation              AM-PAC OT 6 Clicks Daily Activity     Outcome Measure Help from another person eating meals?: None Help from another person taking care of personal grooming?: A Little Help from another person toileting, which includes using toliet, bedpan, or urinal?: A Little Help from another person bathing (including washing, rinsing, drying)?: A Little Help from another person to put on and taking off regular upper body clothing?: A Little Help from another person to put on and taking off regular lower body clothing?: A Little 6 Click Score: 19   End of Session Nurse Communication: Mobility status  Activity Tolerance: Patient tolerated treatment well Patient left: in bed;with call bell/phone within reach;with bed alarm set;with nursing/sitter in room  OT Visit Diagnosis: Other abnormalities of gait and mobility (R26.89);Muscle weakness (generalized) (M62.81);History of falling (Z91.81);Other symptoms and signs involving cognitive function  Time: 9156-9140 OT Time Calculation (min): 16 min Charges:  OT General Charges $OT Visit: 1 Visit OT Evaluation $OT Eval Moderate Complexity: 1 Mod  Benjamin Espinoza, OT Acute Rehabilitation Services Office [phone] Secure Chat Preferred '  Benjamin Espinoza 10/14/2023, 11:17 AM
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
## 2023-10-15 ENCOUNTER — Inpatient Hospital Stay (HOSPITAL_COMMUNITY)

## 2023-10-15 DIAGNOSIS — I779 Disorder of arteries and arterioles, unspecified: Secondary | ICD-10-CM

## 2023-10-15 DIAGNOSIS — I6389 Other cerebral infarction: Secondary | ICD-10-CM | POA: Diagnosis not present

## 2023-10-15 DIAGNOSIS — R079 Chest pain, unspecified: Secondary | ICD-10-CM

## 2023-10-15 DIAGNOSIS — I509 Heart failure, unspecified: Secondary | ICD-10-CM

## 2023-10-15 DIAGNOSIS — F1410 Cocaine abuse, uncomplicated: Secondary | ICD-10-CM | POA: Diagnosis not present

## 2023-10-15 DIAGNOSIS — R29701 NIHSS score 1: Secondary | ICD-10-CM | POA: Diagnosis not present

## 2023-10-15 LAB — BASIC METABOLIC PANEL WITH GFR
Anion gap: 9 (ref 5–15)
BUN: 25 mg/dL — ABNORMAL HIGH (ref 6–20)
CO2: 23 mmol/L (ref 22–32)
Calcium: 9.2 mg/dL (ref 8.9–10.3)
Chloride: 102 mmol/L (ref 98–111)
Creatinine, Ser: 1.49 mg/dL — ABNORMAL HIGH (ref 0.61–1.24)
GFR, Estimated: 54 mL/min — ABNORMAL LOW (ref >60)
Glucose, Bld: 94 mg/dL (ref 70–99)
Potassium: 4.2 mmol/L (ref 3.5–5.1)
Sodium: 134 mmol/L — ABNORMAL LOW (ref 135–145)

## 2023-10-15 LAB — ECHOCARDIOGRAM COMPLETE
AR max vel: 3.81 cm2
AV Peak grad: 9.4 mmHg
Ao pk vel: 1.53 m/s
Area-P 1/2: 3.53 cm2
Height: 72 in
S' Lateral: 3.8 cm
Weight: 2984.15 [oz_av]

## 2023-10-15 MED ORDER — PROCHLORPERAZINE MALEATE 10 MG PO TABS
10.0000 mg | ORAL_TABLET | Freq: Once | ORAL | Status: AC
  Administered 2023-10-15: 10 mg via ORAL
  Filled 2023-10-15: qty 1

## 2023-10-15 MED ORDER — KETOROLAC TROMETHAMINE 15 MG/ML IJ SOLN
15.0000 mg | Freq: Once | INTRAMUSCULAR | Status: AC
  Administered 2023-10-15: 15 mg via INTRAVENOUS
  Filled 2023-10-15: qty 1

## 2023-10-15 MED ORDER — DIPHENHYDRAMINE HCL 25 MG PO CAPS
25.0000 mg | ORAL_CAPSULE | Freq: Once | ORAL | Status: AC
  Administered 2023-10-15: 25 mg via ORAL
  Filled 2023-10-15: qty 1

## 2023-10-15 NOTE — Progress Notes (Signed)
 Pt reports brief periods of visual disturbances, where he describes I can't see, it goes black and then I see stars and then I can see again. Pt reports with this most recent occurrence, pt was turning head from side to side due to neck pain prior. Pt reports providers are aware and instructed him to make nursing aware when it happens. Dr. Myrna made aware, received order for ortho v/s x1 and monitor for further changes. Vitals complete and Dr. Myrna notified. Plan of care ongoing.

## 2023-10-15 NOTE — Plan of Care (Signed)
   Problem: Health Behavior/Discharge Planning: Goal: Ability to manage health-related needs will improve Outcome: Progressing   Problem: Clinical Measurements: Goal: Will remain free from infection Outcome: Progressing   Problem: Clinical Measurements: Goal: Cardiovascular complication will be avoided Outcome: Progressing

## 2023-10-15 NOTE — Plan of Care (Signed)

## 2023-10-15 NOTE — Progress Notes (Addendum)
 STROKE TEAM PROGRESS NOTE   INTERIM HISTORY/SUBJECTIVE No family at the bedside.  Patient is on the phone.  He is complaining of headache and chest pain RN is at the bedside and states he is about to get medication No new neurological events overnight  CBC    Component Value Date/Time   WBC 5.6 10/14/2023 0426   RBC 4.36 10/14/2023 0426   HGB 13.7 10/14/2023 0426   HGB 14.2 04/09/2021 0904   HCT 41.1 10/14/2023 0426   HCT 41.6 04/09/2021 0904   PLT 203 10/14/2023 0426   PLT 231 04/09/2021 0904   MCV 94.3 10/14/2023 0426   MCV 92 04/09/2021 0904   MCH 31.4 10/14/2023 0426   MCHC 33.3 10/14/2023 0426   RDW 14.3 10/14/2023 0426   RDW 13.1 04/09/2021 0904   LYMPHSABS 1.6 10/13/2023 0859   LYMPHSABS 2.1 04/09/2021 0904   MONOABS 0.6 10/13/2023 0859   EOSABS 0.1 10/13/2023 0859   EOSABS 0.1 04/09/2021 0904   BASOSABS 0.0 10/13/2023 0859   BASOSABS 0.1 04/09/2021 0904    BMET    Component Value Date/Time   NA 134 (L) 10/15/2023 0526   NA 141 04/09/2021 0904   K 4.2 10/15/2023 0526   CL 102 10/15/2023 0526   CO2 23 10/15/2023 0526   GLUCOSE 94 10/15/2023 0526   BUN 25 (H) 10/15/2023 0526   BUN 13 04/09/2021 0904   CREATININE 1.49 (H) 10/15/2023 0526   CALCIUM  9.2 10/15/2023 0526   EGFR 68 04/09/2021 0904   GFRNONAA 54 (L) 10/15/2023 0526    IMAGING past 24 hours ECHOCARDIOGRAM COMPLETE Result Date: 10/15/2023    ECHOCARDIOGRAM REPORT   Patient Name:   Benjamin Espinoza Date of Exam: 10/15/2023 Medical Rec #:  [id number]          Height:       72.0 in Accession #:    [id number]         Weight:       186.5 lb Date of Birth:  October 18, 1965          BSA:          2.068 m Patient Age:    58 years           BP:           129/92 mmHg Patient Gender: M                  HR:           70 bpm. Exam Location:  Inpatient Procedure: 2D Echo, Cardiac Doppler and Color Doppler (Both Spectral and Color            Flow Doppler were utilized during procedure). Indications:    CHF I50.9  History:         Patient has prior history of Echocardiogram examinations, most                 recent 06/16/2023. Cardiomyopathy, CAD, Previous Myocardial                 Infarction and Angina, Prior CABG, Stroke, PAD and CKD, stage 3,                 Arrythmias:Bradycardia, Signs/Symptoms:Chest Pain and Shortness                 of Breath; Risk Factors:Hypertension, Dyslipidemia and Current                 Smoker.  Sonographer:  Thea Norlander RCS Referring Phys: [id number] JULIE MACHEN IMPRESSIONS  1. Hypokinesis of basal septuml unchanged from previous Echo. Left ventricular ejection fraction, by estimation, is 50 to 55%. The left ventricle has low normal function. There is mild left ventricular hypertrophy. Left ventricular diastolic parameters were normal.  2. Right ventricular systolic function is normal. The right ventricular size is normal.  3. The mitral valve is normal in structure. Mild mitral valve regurgitation.  4. The aortic valve is tricuspid. Aortic valve regurgitation is mild. Aortic valve sclerosis/calcification is present, without any evidence of aortic stenosis.  5. Compared to previous echo, root is slightly bigger. . Aortic dilatation noted. There is moderate dilatation of the aortic root, measuring 46 mm. There is mild dilatation of the ascending aorta, measuring 42 mm. FINDINGS  Left Ventricle: Hypokinesis of basal septuml unchanged from previous Echo. Left ventricular ejection fraction, by estimation, is 50 to 55%. The left ventricle has low normal function. The left ventricular internal cavity size was normal in size. There is mild left ventricular hypertrophy. Left ventricular diastolic parameters were normal. Right Ventricle: The right ventricular size is normal. Right vetricular wall thickness was not assessed. Right ventricular systolic function is normal. Left Atrium: Left atrial size was normal in size. Right Atrium: Right atrial size was normal in size. Pericardium: There is no evidence of  pericardial effusion. Mitral Valve: The mitral valve is normal in structure. Mild mitral valve regurgitation. Tricuspid Valve: The tricuspid valve is normal in structure. Tricuspid valve regurgitation is mild. Aortic Valve: The aortic valve is tricuspid. Aortic valve regurgitation is mild. Aortic valve sclerosis/calcification is present, without any evidence of aortic stenosis. Aortic valve peak gradient measures 9.4 mmHg. Pulmonic Valve: The pulmonic valve was normal in structure. Pulmonic valve regurgitation is mild. Aorta: Compared to previous echo, root is slightly bigger. Aortic dilatation noted. There is moderate dilatation of the aortic root, measuring 46 mm. There is mild dilatation of the ascending aorta, measuring 42 mm. IAS/Shunts: No atrial level shunt detected by color flow Doppler.  LEFT VENTRICLE PLAX 2D LVIDd:         5.10 cm   Diastology LVIDs:         3.80 cm   LV e' medial:    7.18 cm/s LV PW:         1.00 cm   LV E/e' medial:  10.4 LV IVS:        1.20 cm   LV e' lateral:   14.70 cm/s LVOT diam:     2.40 cm   LV E/e' lateral: 5.1 LV SV:         98 LV SV Index:   47 LVOT Area:     4.52 cm  RIGHT VENTRICLE            IVC RV S prime:     7.83 cm/s  IVC diam: 0.90 cm TAPSE (M-mode): 1.6 cm LEFT ATRIUM           Index        RIGHT ATRIUM           Index LA diam:      3.70 cm 1.79 cm/m   RA Area:     15.90 cm LA Vol (A2C): 51.2 ml 24.76 ml/m  RA Volume:   40.85 ml  19.75 ml/m LA Vol (A4C): 28.3 ml 13.68 ml/m  AORTIC VALVE AV Area (Vmax): 3.81 cm AV Vmax:        153.00 cm/s AV Peak Grad:  9.4 mmHg LVOT Vmax:      129.00 cm/s LVOT Vmean:     80.100 cm/s LVOT VTI:       0.216 m  AORTA Ao Root diam: 4.60 cm Ao Asc diam:  4.20 cm MITRAL VALVE MV Area (PHT): 3.53 cm    SHUNTS MV Decel Time: 215 msec    Systemic VTI:  0.22 m MV E velocity: 75.00 cm/s  Systemic Diam: 2.40 cm MV A velocity: 64.50 cm/s MV E/A ratio:  1.16 Benjamin Gull MD Electronically signed by Benjamin Gull MD Signature Date/Time:  10/15/2023/11:40:57 AM    Final     Vitals:   10/15/23 0359 10/15/23 0500 10/15/23 0719 10/15/23 1059  BP: 102/70  (!) 129/92 (!) 109/59  Pulse: 79  71 70  Resp:   18 16  Temp: 98 F (36.7 C)  98.3 F (36.8 C) 98.1 F (36.7 C)  TempSrc: Oral  Oral Oral  SpO2: 98%  100% 100%  Weight:  84.6 kg    Height:         PHYSICAL EXAM General:  Alert, well-nourished, well-developed patient in no acute distress Psych:  Mood and affect appropriate for situation CV: Regular rate and rhythm on monitor Respiratory:  Regular, unlabored respirations on room air GI: Abdomen soft and nontender   NEURO:  Mental Status: AA&Ox3, patient is able to give clear and coherent history Speech/Language: speech is without dysarthria or aphasia.  Naming, repetition, fluency, and comprehension intact.  Cranial Nerves:  II: PERRL. Visual fields full.  III, IV, VI: EOMI. Eyelids elevate symmetrically.  V: Sensation is intact to light touch and symmetrical to face.  VII: Face is symmetrical resting and smiling VIII: hearing intact to voice. IX, X: Palate elevates symmetrically. Phonation is normal.  KP:Dynloizm shrug 5/5. XII: tongue is midline without fasciculations. Motor: 5/5 strength to all muscle groups tested.  Tone: is normal and bulk is normal Sensation-decrease sensation on right hemibody Coordination: FTN intact bilaterally, HKS: no ataxia in BLE.No drift.  Gait- deferred  Most Recent NIH   1a Level of Conscious.:  1b LOC Questions:  1c LOC Commands:  2 Best Gaze:  3 Visual:  4 Facial Palsy:  5a Motor Arm - left:  5b Motor Arm - Right:  6a Motor Leg - Left:  6b Motor Leg - Right:  7 Limb Ataxia:  8 Sensory: 1 with split from midline and includes the forehead.  Splits midline forehead for vibration 9 Best Language:  10 Dysarthria:  11 Extinct. and Inatten.:  TOTAL: 1   ASSESSMENT/PLAN  [name] is a 58 y.o. male with history of  PAD s/p right vein replacement, CAD  s/p 4 vessel CABG 2017, HFrEF, HTN, HLD, aortic insufficiency, CKDII/IIIa, and cocaine use disorder admitted for chest pain that has been going on for about a week, worsened last night with the use of cocaine. He also had one short episode of aphasia, where his friends could not understand what he was saying. He also has had right arm numbness for the last week or so, and during this time he feels like he randomly drops objects that are in his right arm.   Stroke:  left punctate acute cortical infarct in the left superior perirolandic sensory area, etiology: large vessel disease from cocaine abuse and uncontrolled risk factors  CTA head & neck Occlusion of a proximal M2 branch of the left MCA with reconstitution within the posterior aspect of the sylvian fissure. Mild stenosis of the  distal P2 segment of the right PCA. Mild stenosis of the carotid siphons.  MRI punctate acute cortical infarct in the left superior perirolandic sensory area. Superimposed posttraumatic appearing encephalomalacia left superior temporal gyrus. And punctate microhemorrhage in the cerebellar vermis 2D Echo EF 50 to 55%.  Mild LVH LDL 193 HgbA1c 5.4 VTE prophylaxis - SCD's aspirin  81 mg daily, Xarelto  (rivaroxaban ) daily, and cilostazol  (had not taken Xarelto  for over 1 month) prior to admission, now on home aspirin  81 mg daily and Xarelto  (rivaroxaban ) daily and cilostazol  Therapy recommendations:  SNF Disposition:  pending   Hypertension cardiomyopathy CAD/MI s/p CABG 2017 and PCI 2021 Home meds: coreg  6.25 mg, Imdur  120 mg, losartan  50 mg recently unstable angina s/p cath 06/16/2023  On home meds BP goal normotensive Follow up with cardiology closely as outpt  Hyperlipidemia Home meds:  atorvastatin  80mg ,  not compliant LDL 193, goal < 70 Now on lipitor  80 Continue statin at discharge  Tobacco Abuse Patient smokes cigarette everyday Nicotine  replacement therapy provided Cessation education provided Patient  is willing to quit  Cocaine and THC abuse Patient admitted using cocaine and marijuana  TOC consult for cessation placed Patient is willing to quit  Other Stroke Risk Factors ETOH use,, advised to drink no more than 2 drink(s) a day PAD s/p redo right femoropopliteal bypass   Other Active Problems Homeless CTA head showed medially directed outpouching along the posterior aspect of the left cavernous ICA measuring 3 x 2 mm, concerning for aneurysm, needs outpt follow up  Hospital day # 2  Karna Geralds DNP, ACNPC-AG  [facility]  ATTENDING NOTE: I reviewed above note and agree with the assessment and plan. Pt was seen and examined.   No family at bedside.  Patient complaining of mild headache for last 2 days, taking Tylenol  as needed.  Still felt mild numbness on the right upper and lower extremities.  Otherwise neurologically intact.  Patient stroke likely due to large vessel disease with substance abuse and multiple uncontrolled risk factors.  Now on aspirin  81, pletal  and Xarelto  as well as high-dose statin.  Medication compliance education provided, tobacco and cocaine cessation education provided.  PT and OT recommend SNF.  For detailed assessment and plan, please refer to above as I have made changes wherever appropriate.   Neurology will sign off. Please call with questions. Pt will follow up with stroke clinic NP at [facility] in about 4 weeks. Thanks for the consult.   Ary Cummins, MD PhD Stroke Neurology 10/15/2023 6:11 PM    To contact Stroke Continuity provider, please refer to [email or web address]. After hours, contact General Neurology

## 2023-10-15 NOTE — Progress Notes (Signed)
 Echocardiogram 2D Echocardiogram has been performed.  Benjamin Espinoza 10/15/2023, 11:46 AM

## 2023-10-15 NOTE — Progress Notes (Signed)
 HD#2 Subjective:   Summary: This is a 58 year old male with a past medical history of PAD status post right femoral bypass graft, CAD status post CABG in 2017, HFrEF, hypertension, cocaine use who presents to the emergency room with concerns of history of right arm numbness and found to have acute CVA.  Patient admitted for further evaluation and management.  Overnight Events: No acute events overnight  Patient evaluated at bedside this morning.  He endorses having a headache this morning.  He otherwise states he is eating well and using the bathroom well.  He states he is having myalgias.  It is troubling him to get up out of bed.  Did report having some dark spots in his eyes, that improved.  Objective:  Vital signs in last 24 hours: Vitals:   10/15/23 0046 10/15/23 0359 10/15/23 0500 10/15/23 0719  BP: 102/76 102/70  (!) 129/92  Pulse: 86 79  71  Resp:    18  Temp: 98.6 F (37 C) 98 F (36.7 C)  98.3 F (36.8 C)  TempSrc: Oral Oral  Oral  SpO2: 100% 98%  100%  Weight:   84.6 kg   Height:       Supplemental O2: Room Air SpO2: 100 %   Physical Exam:  Constitutional: Resting in bed, no acute distress HENT: normocephalic atraumatic Cardiovascular: regular rate and rhythm, no m/r/g Pulmonary/Chest: normal work of breathing on room air, lungs clear to auscultation bilaterally Neurological: Alert and oriented x 3, 5/5 strength noted to bilateral upper and lower extremities.  Pupils equal and reactive to light.  Decreased sensation noted to right upper extremity compared to left upper extremity  Filed Weights   10/13/23 0851 10/15/23 0500  Weight: 88 kg 84.6 kg     Intake/Output Summary (Last 24 hours) at 10/15/2023 0932 Last data filed at 10/15/2023 9070 Gross per 24 hour  Intake 240 ml  Output 1500 ml  Net -1260 ml   Net IO Since Admission: -2,060 mL [10/15/23 0932]  Pertinent Labs:    Latest Ref Rng & Units 10/14/2023    4:26 AM 10/13/2023    8:59 AM 06/17/2023     4:26 AM  CBC  WBC 4.0 - 10.5 K/uL 5.6  5.9  4.7   Hemoglobin 13.0 - 17.0 g/dL 86.2  85.6  85.3   Hematocrit 39.0 - 52.0 % 41.1  43.6  43.6   Platelets 150 - 400 K/uL 203  211  248        Latest Ref Rng & Units 10/15/2023    5:26 AM 10/14/2023    4:26 AM 10/13/2023   10:10 AM  CMP  Glucose 70 - 99 mg/dL 94  90    BUN 6 - 20 mg/dL 25  25    Creatinine 9.38 - 1.24 mg/dL 8.50  8.33    Sodium 864 - 145 mmol/L 134  138    Potassium 3.5 - 5.1 mmol/L 4.2  4.2    Chloride 98 - 111 mmol/L 102  106    CO2 22 - 32 mmol/L 23  23    Calcium  8.9 - 10.3 mg/dL 9.2  9.5    Total Protein 6.5 - 8.1 g/dL  6.8  7.8   Total Bilirubin 0.0 - 1.2 mg/dL  0.7  0.7   Alkaline Phos 38 - 126 U/L  60  67   AST 15 - 41 U/L  23  30   ALT 0 - 44 U/L  14  17  Imaging: No results found.  Assessment/Plan:   Principal Problem:   Acute CVA (cerebrovascular accident) (HCC) Active Problems:   Essential hypertension   DDD (degenerative disc disease), lumbar   Coronary artery disease   PAD (peripheral artery disease) (HCC)   Cocaine abuse (HCC)   Dilation of thoracic aorta (HCC)   Heart failure with mid-range ejection fraction (HFmEF) (HCC)   Chronic kidney disease, stage 3a (HCC)   Aortic dilatation (HCC)   Patient Summary: Benjamin Espinoza is a 58 y.o. male with a past medical history of PAD status post right femoral bypass graft, CAD status post CABG in 2017, HFrEF, hypertension, cocaine use who presents to the emergency room with concerns of history of right arm numbness and found to have acute CVA.  Patient admitted for further evaluation and management.  #Acute punctate cortical infarct in the left superior perirolandic sensory area Patient has been working with PT/OT who are recommending SNF.  He has been started on aspirin .  We have resumed his Xarelto  here.  We did resume his high intensity statin as well.  He is improving well.  Awaiting echo.  Did have headache this morning, will give  headache cocktail. -PT/OT following - Neurology following, appreciate recommendation - Continue aspirin  81 mg daily - Continue Xarelto  20 mg daily - Continue atorvastatin  80 mg daily - SNF placement - Echo pending - Headache cocktail with Benadryl  and Compazine   #CAD status post CABG 2017 Patient has significant CAD.  He has had a CABG in 2017.  Most recent cardiac cath in April 2025 showing multiple stenosed vessels.  He however is not a good candidate for PCI at this time given ongoing substance abuse and noncompliance with medical therapy.  Educated on the importance of adherence to medication. - Continue home Xarelto  20 mg daily - Continue aspirin  81 mg daily  #HFrecEF Most recent echocardiogram showing ejection fraction of 45 to 50%.  Patient has not been adherent to his GDMT outpatient.  We did see a noticeable blood pressure drop we will restart his medications.  No concern for anginal pain at this time. - Continue Coreg  6.25 mg daily - Continue Imdur  120 mg daily - Continue losartan  50 mg daily - Continue Xarelto  20 mg daily - Encouraged adherence to his medications - Follow up ECHO   #CKD stage II/IIIa Patient seems to be approaching baseline.  Creatinine 1.49.  Will continue to monitor closely. - Monitor urine output - Continue monitoring BMP  #Hyperlipidemia LDL at 193.  Not at goal. Goal will be less than 70. -Continue atorvastatin  80 mg daily - Follow-up lipid panel outpatient  #PAD Patient has past medical history of vascular disease and also has had a femoral bypass in the past.  No acute concerns during hospitalization.  Encouraged compliance. - Continue Ranexa  1000 mg twice daily - Continue atorvastatin  80 mg daily - Continue Xarelto  20 mg daily - Continue Pletal  100 mg twice daily  #Aortic dilatation CT showing evidence of ascending aortic dilatation as well as infrarenal abdominal aortic dilatation. - Ascending aortic dilatation, warrants f/u w CTA in 1  year.  - Infrarenal abdominal aortic dilatation, warrants f/u w US  at 3 years.  - Strict blood pressure control  #Cocaine use Cocaine use contributing to longstanding hypertension likely contributing to the poor vascular disease he has now.  Substance abuse counseling provided.  TOC provided information on AVS - Continue education - Encourage cessation of cocaine use  Diet: Heart Healthy IVF: None,None VTE: DOAC Code:  Full PT/OT recs: SNF for Subacute PT  Dispo: Anticipated discharge to Skilled nursing facility in 2 days pending SNF placement.   Libby Blanch DO Internal Medicine Resident PGY-3 Please contact the on call pager after 5 pm and on weekends at [phone]

## 2023-10-16 DIAGNOSIS — I77811 Abdominal aortic ectasia: Secondary | ICD-10-CM

## 2023-10-16 DIAGNOSIS — I639 Cerebral infarction, unspecified: Secondary | ICD-10-CM

## 2023-10-16 DIAGNOSIS — N189 Chronic kidney disease, unspecified: Secondary | ICD-10-CM

## 2023-10-16 DIAGNOSIS — I5020 Unspecified systolic (congestive) heart failure: Secondary | ICD-10-CM

## 2023-10-16 DIAGNOSIS — Z7901 Long term (current) use of anticoagulants: Secondary | ICD-10-CM

## 2023-10-16 LAB — BASIC METABOLIC PANEL WITH GFR
Anion gap: 9 (ref 5–15)
BUN: 26 mg/dL — ABNORMAL HIGH (ref 6–20)
CO2: 22 mmol/L (ref 22–32)
Calcium: 9.1 mg/dL (ref 8.9–10.3)
Chloride: 104 mmol/L (ref 98–111)
Creatinine, Ser: 1.77 mg/dL — ABNORMAL HIGH (ref 0.61–1.24)
GFR, Estimated: 44 mL/min — ABNORMAL LOW (ref >60)
Glucose, Bld: 85 mg/dL (ref 70–99)
Potassium: 3.9 mmol/L (ref 3.5–5.1)
Sodium: 135 mmol/L (ref 135–145)

## 2023-10-16 LAB — CBC
HCT: 40.4 % (ref 39.0–52.0)
Hemoglobin: 13.4 g/dL (ref 13.0–17.0)
MCH: 31.3 pg (ref 26.0–34.0)
MCHC: 33.2 g/dL (ref 30.0–36.0)
MCV: 94.4 fL (ref 80.0–100.0)
Platelets: 211 K/uL (ref 150–400)
RBC: 4.28 MIL/uL (ref 4.22–5.81)
RDW: 14 % (ref 11.5–15.5)
WBC: 4.9 K/uL (ref 4.0–10.5)
nRBC: 0 % (ref 0.0–0.2)

## 2023-10-16 MED ORDER — SODIUM CHLORIDE 0.9 % IV SOLN: INTRAVENOUS | Status: AC

## 2023-10-16 MED ORDER — LACTATED RINGERS IV SOLN: INTRAVENOUS | Status: DC

## 2023-10-16 MED ORDER — ACETAMINOPHEN 500 MG PO TABS
1000.0000 mg | ORAL_TABLET | Freq: Four times a day (QID) | ORAL | Status: DC
  Filled 2023-10-16: qty 2

## 2023-10-16 MED ORDER — PROCHLORPERAZINE EDISYLATE 10 MG/2ML IJ SOLN
10.0000 mg | Freq: Once | INTRAMUSCULAR | Status: AC
  Administered 2023-10-16: 10 mg via INTRAVENOUS
  Filled 2023-10-16: qty 2

## 2023-10-16 MED ORDER — DIPHENHYDRAMINE HCL 50 MG/ML IJ SOLN
12.5000 mg | Freq: Once | INTRAMUSCULAR | Status: AC
  Administered 2023-10-16: 12.5 mg via INTRAVENOUS
  Filled 2023-10-16: qty 1

## 2023-10-16 NOTE — Plan of Care (Signed)
  Problem: Education: Goal: Knowledge of General Education information will improve Description: Including pain rating scale, medication(s)/side effects and non-pharmacologic comfort measures Outcome: Progressing   Problem: Health Behavior/Discharge Planning: Goal: Ability to manage health-related needs will improve Outcome: Progressing   Problem: Clinical Measurements: Goal: Ability to maintain clinical measurements within normal limits will improve Outcome: Progressing Goal: Will remain free from infection Outcome: Progressing   Problem: Coping: Goal: Level of anxiety will decrease Outcome: Progressing   Problem: Elimination: Goal: Will not experience complications related to urinary retention Outcome: Progressing

## 2023-10-16 NOTE — Progress Notes (Signed)
 HD#3 Subjective:   Summary: This is a 58 year old male with a past medical history of PAD status post right femoral bypass graft, CAD status post CABG in 2017, HFrEF, hypertension, cocaine use who presents to the emergency room with concerns of history of right arm numbness and found to have acute CVA.  Patient admitted for further evaluation and management.  Overnight Events: No acute events overnight  Patient evaluated at bedside this morning.  Patient endorses having chronic back pain.  He also endorses left-sided debilitating headache.  He is frustrated that it is not improving.  I had a long counseling session with him about dealing with his headache.  He understood.  He was very reasonable.  He was not hostile nor was he aggressive.  He just wanted to advocate for himself.  Objective:  Vital signs in last 24 hours: Vitals:   10/15/23 1059 10/15/23 2104 10/16/23 0103 10/16/23 0500  BP: (!) 109/59 120/81 102/71   Pulse: 70 84 87   Resp: 16 18 18    Temp: 98.1 F (36.7 C) 97.7 F (36.5 C) 98.3 F (36.8 C)   TempSrc: Oral Oral Oral   SpO2: 100% 100% 98%   Weight:    84.3 kg  Height:       Supplemental O2: Room Air SpO2: 98 %   Physical Exam:  Constitutional: Resting in bed, no acute distress HENT: normocephalic atraumatic Cardiovascular: regular rate and rhythm, no m/r/g Pulmonary/Chest: normal work of breathing on room air, lungs clear to auscultation bilaterally Neurological: 5/5 strength noted to bilateral upper extremities.  Pupils equal and reactive to light.  Having decreased sensation noted to right upper extremity compared to left upper extremity  Filed Weights   10/13/23 0851 10/15/23 0500 10/16/23 0500  Weight: 88 kg 84.6 kg 84.3 kg     Intake/Output Summary (Last 24 hours) at 10/16/2023 0604 Last data filed at 10/16/2023 0500 Gross per 24 hour  Intake --  Output 1500 ml  Net -1500 ml   Net IO Since Admission: -3,160 mL [10/16/23 0604]  Pertinent  Labs:    Latest Ref Rng & Units 10/16/2023    5:22 AM 10/14/2023    4:26 AM 10/13/2023    8:59 AM  CBC  WBC 4.0 - 10.5 K/uL 4.9  5.6  5.9   Hemoglobin 13.0 - 17.0 g/dL 86.5  86.2  85.6   Hematocrit 39.0 - 52.0 % 40.4  41.1  43.6   Platelets 150 - 400 K/uL 211  203  211        Latest Ref Rng & Units 10/16/2023    5:22 AM 10/15/2023    5:26 AM 10/14/2023    4:26 AM  CMP  Glucose 70 - 99 mg/dL 85  94  90   BUN 6 - 20 mg/dL 26  25  25    Creatinine 0.61 - 1.24 mg/dL 8.22  8.50  8.33   Sodium 135 - 145 mmol/L 135  134  138   Potassium 3.5 - 5.1 mmol/L 3.9  4.2  4.2   Chloride 98 - 111 mmol/L 104  102  106   CO2 22 - 32 mmol/L 22  23  23    Calcium  8.9 - 10.3 mg/dL 9.1  9.2  9.5   Total Protein 6.5 - 8.1 g/dL   6.8   Total Bilirubin 0.0 - 1.2 mg/dL   0.7   Alkaline Phos 38 - 126 U/L   60   AST 15 - 41 U/L  23   ALT 0 - 44 U/L   14     Imaging: ECHOCARDIOGRAM COMPLETE Result Date: 10/15/2023    ECHOCARDIOGRAM REPORT   Patient Name:   DERYK BOZMAN Date of Exam: 10/15/2023 Medical Rec #:  [id number]          Height:       72.0 in Accession #:    [id number]         Weight:       186.5 lb Date of Birth:  [birth year]          BSA:          2.068 m Patient Age:    57 years           BP:           129/92 mmHg Patient Gender: M                  HR:           70 bpm. Exam Location:  Inpatient Procedure: 2D Echo, Cardiac Doppler and Color Doppler (Both Spectral and Color            Flow Doppler were utilized during procedure). Indications:    CHF I50.9  History:        Patient has prior history of Echocardiogram examinations, most                 recent 06/16/2023. Cardiomyopathy, CAD, Previous Myocardial                 Infarction and Angina, Prior CABG, Stroke, PAD and CKD, stage 3,                 Arrythmias:Bradycardia, Signs/Symptoms:Chest Pain and Shortness                 of Breath; Risk Factors:Hypertension, Dyslipidemia and Current                 Smoker.  Sonographer:    Thea Norlander RCS  Referring Phys: [id number] JULIE MACHEN IMPRESSIONS  1. Hypokinesis of basal septuml unchanged from previous Echo. Left ventricular ejection fraction, by estimation, is 50 to 55%. The left ventricle has low normal function. There is mild left ventricular hypertrophy. Left ventricular diastolic parameters were normal.  2. Right ventricular systolic function is normal. The right ventricular size is normal.  3. The mitral valve is normal in structure. Mild mitral valve regurgitation.  4. The aortic valve is tricuspid. Aortic valve regurgitation is mild. Aortic valve sclerosis/calcification is present, without any evidence of aortic stenosis.  5. Compared to previous echo, root is slightly bigger. . Aortic dilatation noted. There is moderate dilatation of the aortic root, measuring 46 mm. There is mild dilatation of the ascending aorta, measuring 42 mm. FINDINGS  Left Ventricle: Hypokinesis of basal septuml unchanged from previous Echo. Left ventricular ejection fraction, by estimation, is 50 to 55%. The left ventricle has low normal function. The left ventricular internal cavity size was normal in size. There is mild left ventricular hypertrophy. Left ventricular diastolic parameters were normal. Right Ventricle: The right ventricular size is normal. Right vetricular wall thickness was not assessed. Right ventricular systolic function is normal. Left Atrium: Left atrial size was normal in size. Right Atrium: Right atrial size was normal in size. Pericardium: There is no evidence of pericardial effusion. Mitral Valve: The mitral valve is normal in structure. Mild mitral valve regurgitation. Tricuspid Valve: The tricuspid valve is normal in structure.  Tricuspid valve regurgitation is mild. Aortic Valve: The aortic valve is tricuspid. Aortic valve regurgitation is mild. Aortic valve sclerosis/calcification is present, without any evidence of aortic stenosis. Aortic valve peak gradient measures 9.4 mmHg. Pulmonic Valve: The  pulmonic valve was normal in structure. Pulmonic valve regurgitation is mild. Aorta: Compared to previous echo, root is slightly bigger. Aortic dilatation noted. There is moderate dilatation of the aortic root, measuring 46 mm. There is mild dilatation of the ascending aorta, measuring 42 mm. IAS/Shunts: No atrial level shunt detected by color flow Doppler.  LEFT VENTRICLE PLAX 2D LVIDd:         5.10 cm   Diastology LVIDs:         3.80 cm   LV e' medial:    7.18 cm/s LV PW:         1.00 cm   LV E/e' medial:  10.4 LV IVS:        1.20 cm   LV e' lateral:   14.70 cm/s LVOT diam:     2.40 cm   LV E/e' lateral: 5.1 LV SV:         98 LV SV Index:   47 LVOT Area:     4.52 cm  RIGHT VENTRICLE            IVC RV S prime:     7.83 cm/s  IVC diam: 0.90 cm TAPSE (M-mode): 1.6 cm LEFT ATRIUM           Index        RIGHT ATRIUM           Index LA diam:      3.70 cm 1.79 cm/m   RA Area:     15.90 cm LA Vol (A2C): 51.2 ml 24.76 ml/m  RA Volume:   40.85 ml  19.75 ml/m LA Vol (A4C): 28.3 ml 13.68 ml/m  AORTIC VALVE AV Area (Vmax): 3.81 cm AV Vmax:        153.00 cm/s AV Peak Grad:   9.4 mmHg LVOT Vmax:      129.00 cm/s LVOT Vmean:     80.100 cm/s LVOT VTI:       0.216 m  AORTA Ao Root diam: 4.60 cm Ao Asc diam:  4.20 cm MITRAL VALVE MV Area (PHT): 3.53 cm    SHUNTS MV Decel Time: 215 msec    Systemic VTI:  0.22 m MV E velocity: 75.00 cm/s  Systemic Diam: 2.40 cm MV A velocity: 64.50 cm/s MV E/A ratio:  1.16 Vina Gull MD Electronically signed by Vina Gull MD Signature Date/Time: 10/15/2023/11:40:57 AM    Final     Assessment/Plan:   Principal Problem:   Acute CVA (cerebrovascular accident) [facility]) Active Problems:   Essential hypertension   DDD (degenerative disc disease), lumbar   Coronary artery disease   PAD (peripheral artery disease) (HCC)   Cocaine abuse (HCC)   Dilation of thoracic aorta (HCC)   Heart failure with mid-range ejection fraction (HFmEF) (HCC)   Chronic kidney disease, stage 3a (HCC)   Aortic  dilatation (HCC)   Patient Summary: JOSEPHINE RUDNICK is a 58 y.o. male with a past medical history of PAD status post right femoral bypass graft, CAD status post CABG in 2017, HFrEF, hypertension, cocaine use who presents to the emergency room with concerns of history of right arm numbness and found to have acute CVA.  Patient admitted for further evaluation and management.  #Acute punctate cortical infarct in the left superior perirolandic  sensory area Patient has been working with PT/OT who are recommending SNF.  On aspirin  and statin.  He is also on Xarelto .  Neurology has signed off. ECHO yesterday showed hypokinesis of the basal septum unchanged from previous echo, left EF 50 to 55%.  Mild aortic regurg.  Patient continues to have a headache.  Trying Tylenol  scheduled now.  Headaches do not seem to be improving with headache cocktail or Toradol .  Cannot use triptan.  Will try IV headache cocktail today.  Still pending SNF. - PT/OT following, recommending SNF - Scheduled Tylenol  for headache - Continue aspirin  81 mg daily - Continue Xarelto  20 mg daily - Continue atorvastatin  80 mg daily - SNF placement -IV headache cocktail today with Benadryl  and Compazine   #CAD status post CABG 2017 Patient has significant CAD.  He has had a CABG in 2017.  Most recent cardiac cath in April 2025 showing multiple stenosed vessels.  He however is not a good candidate for PCI at this time given ongoing substance abuse and noncompliance with medical therapy.  Educated on the importance of adherence to medication. - Continue home Xarelto  20 mg daily - Continue aspirin  81 mg daily - Follow-up cardiology outpatient  #HFrecEF Echocardiogram showing improved ejection fraction to 50 to 55% yesterday.  Did resume GDMT.  Tolerating well. - Continue Coreg  6.25 mg daily - Continue Imdur  120 mg daily - Continue losartan  50 mg daily - Continue Xarelto  20 mg daily - Encouraged adherence to his medications  #CKD  stage II/IIIa Patient seems to be approaching baseline.  Creatinine bump to 1.77 yesterday. Will give him a liter today  - Monitor urine output - Continue monitoring BMP - 1L NS infusion over 10 hours   #Hyperlipidemia LDL at 193.  Not at goal. Goal will be less than 70. - Continue atorvastatin  80 mg daily - Follow-up lipid panel outpatient  #PAD Patient has past medical history of vascular disease and also has had a femoral bypass in the past.  No acute concerns during hospitalization.  Encouraged compliance. - Continue Ranexa  1000 mg twice daily - Continue atorvastatin  80 mg daily - Continue Xarelto  20 mg daily - Continue Pletal  100 mg twice daily  #Aortic dilatation CT showing evidence of ascending aortic dilatation as well as infrarenal abdominal aortic dilatation. - Ascending aortic dilatation, warrants f/u w CTA in 1 year.  - Infrarenal abdominal aortic dilatation, warrants f/u w US  at 3 years.  - Strict blood pressure control  #Cocaine use Cocaine use contributing to longstanding hypertension likely contributing to the poor vascular disease he has now.  Substance abuse counseling provided.  TOC provided information on AVS - Continue education - Encourage cessation of cocaine use  Diet: Heart Healthy IVF: None,None VTE: DOAC Code: Full PT/OT recs: SNF for Subacute PT  Dispo: Anticipated discharge to Skilled nursing facility in 2 days pending SNF placement.   Libby Blanch DO Internal Medicine Resident PGY-3 Please contact the on call pager after 5 pm and on weekends at [phone]

## 2023-10-17 LAB — BASIC METABOLIC PANEL WITH GFR
Anion gap: 6 (ref 5–15)
BUN: 23 mg/dL — ABNORMAL HIGH (ref 6–20)
CO2: 26 mmol/L (ref 22–32)
Calcium: 9.5 mg/dL (ref 8.9–10.3)
Chloride: 106 mmol/L (ref 98–111)
Creatinine, Ser: 1.54 mg/dL — ABNORMAL HIGH (ref 0.61–1.24)
GFR, Estimated: 52 mL/min — ABNORMAL LOW (ref >60)
Glucose, Bld: 105 mg/dL — ABNORMAL HIGH (ref 70–99)
Potassium: 4.4 mmol/L (ref 3.5–5.1)
Sodium: 138 mmol/L (ref 135–145)

## 2023-10-17 MED ORDER — PROCHLORPERAZINE EDISYLATE 10 MG/2ML IJ SOLN
10.0000 mg | Freq: Once | INTRAMUSCULAR | Status: AC
  Administered 2023-10-17: 10 mg via INTRAVENOUS
  Filled 2023-10-17: qty 2

## 2023-10-17 MED ORDER — DIPHENHYDRAMINE HCL 50 MG/ML IJ SOLN
12.5000 mg | Freq: Once | INTRAMUSCULAR | Status: AC
  Administered 2023-10-17: 12.5 mg via INTRAVENOUS
  Filled 2023-10-17: qty 1

## 2023-10-17 MED ORDER — ISOSORBIDE MONONITRATE ER 30 MG PO TB24
30.0000 mg | ORAL_TABLET | Freq: Every day | ORAL | Status: DC
  Administered 2023-10-17: 30 mg via ORAL
  Filled 2023-10-17: qty 1

## 2023-10-17 MED ORDER — RANOLAZINE ER 500 MG PO TB12
500.0000 mg | ORAL_TABLET | Freq: Two times a day (BID) | ORAL | Status: DC
  Administered 2023-10-17 – 2023-10-18 (×3): 500 mg via ORAL
  Filled 2023-10-17 (×3): qty 1

## 2023-10-17 NOTE — Progress Notes (Addendum)
 HD#4 SUBJECTIVE:  Patient Summary: This is a 58 year old male with a past medical history of PAD status post right femoral bypass graft, CAD status post CABG in 2017, HFrEF, hypertension, cocaine use who presents to the emergency room with concerns of history of right arm numbness and found to have acute CVA.   Overnight Events: stopped Imdur  due to persistent HA  Interim History: Patient still has a back headache.  He the IV headache medicine last night and he went to bed and did not have pain until he woke up and starts in the forehead and goes all the way back to his head.  Light makes it worse feels like a throbbing pain.  He has had pain for several years and denies new back pain.  Still having neck pain from the accident and his arm numbness has not changed.  The arm numbness and neck pain are better when he lies back, worse when he sits straight up.  He is wearing the nicotine  patch.  He periodically has the chest pain but it is not as bad as it was on presentation it seems like he is most concerned about the headache pain.  Discussed the headache is likely due to the addition of his home medications, inflammation from the stroke and possibly related to cocaine or marijuana or nicotine  withdrawal.  Discussed that we will follow-up with social work team to work on a plan for his discharge.  OBJECTIVE:  Vital Signs: Vitals:   10/16/23 1655 10/16/23 2028 10/17/23 0143 10/17/23 0500  BP: 120/84 94/69 135/75   Pulse: 78 85 75   Resp: 18 18    Temp: 97.8 F (36.6 C) 98.3 F (36.8 C) 98 F (36.7 C)   TempSrc: Oral Oral Oral   SpO2: 100%     Weight:    86.1 kg  Height:       Supplemental O2: Room Air SpO2: 100 %  Filed Weights   10/15/23 0500 10/16/23 0500 10/17/23 0500  Weight: 84.6 kg 84.3 kg 86.1 kg     Intake/Output Summary (Last 24 hours) at 10/17/2023 0607 Last data filed at 10/17/2023 0600 Gross per 24 hour  Intake 338.42 ml  Output 1025 ml  Net -686.58 ml   Net IO  Since Admission: -3,846.58 mL [10/17/23 0607]  Physical Exam: Physical Exam Vitals reviewed.  Constitutional:      Appearance: He is well-developed. He is not toxic-appearing or diaphoretic.  Cardiovascular:     Rate and Rhythm: Normal rate.     Heart sounds: Normal heart sounds.  Pulmonary:     Effort: Pulmonary effort is normal.     Breath sounds: Normal breath sounds.  Abdominal:     General: Bowel sounds are normal.  Musculoskeletal:     Right lower leg: No edema.     Left lower leg: No edema.  Skin:    General: Skin is warm and dry.  Neurological:     Mental Status: He is alert and oriented to person, place, and time.  Psychiatric:        Mood and Affect: Mood normal.        Behavior: Behavior normal.     Patient Lines/Drains/Airways Status     Active Line/Drains/Airways     Name Placement date Placement time Site Days   Peripheral IV 10/13/23 20 G Right Antecubital 10/13/23  1011  Antecubital  4            Pertinent labs and imaging:  Latest Ref Rng & Units 10/16/2023    5:22 AM 10/14/2023    4:26 AM 10/13/2023    8:59 AM  CBC  WBC 4.0 - 10.5 K/uL 4.9  5.6  5.9   Hemoglobin 13.0 - 17.0 g/dL 86.5  86.2  85.6   Hematocrit 39.0 - 52.0 % 40.4  41.1  43.6   Platelets 150 - 400 K/uL 211  203  211        Latest Ref Rng & Units 10/17/2023    3:33 AM 10/16/2023    5:22 AM 10/15/2023    5:26 AM  CMP  Glucose 70 - 99 mg/dL 894  85  94   BUN 6 - 20 mg/dL 23  26  25    Creatinine 0.61 - 1.24 mg/dL 8.45  8.22  8.50   Sodium 135 - 145 mmol/L 138  135  134   Potassium 3.5 - 5.1 mmol/L 4.4  3.9  4.2   Chloride 98 - 111 mmol/L 106  104  102   CO2 22 - 32 mmol/L 26  22  23    Calcium  8.9 - 10.3 mg/dL 9.5  9.1  9.2     No results found.  ASSESSMENT/PLAN:  Assessment: Principal Problem:   Acute CVA (cerebrovascular accident) (HCC) Active Problems:   Essential hypertension   DDD (degenerative disc disease), lumbar   Coronary artery disease   PAD  (peripheral artery disease) (HCC)   Cocaine abuse (HCC)   Dilation of thoracic aorta (HCC)   Heart failure with mid-range ejection fraction (HFmEF) (HCC)   Chronic kidney disease, stage 3a (HCC)   Aortic dilatation (HCC)   Plan: CAYLE THUNDER is a 58 y.o. male with a past medical history of PAD status post right femoral bypass graft, CAD status post CABG in 2017, HFrEF, hypertension, cocaine use who presents to the emergency room with concerns of history of right arm numbness and found to have acute CVA.  Patient admitted for further evaluation and management.   #Acute punctate cortical infarct in the left superior perirolandic sensory area Patient continues to have a headache, with photophobia in a tension-like location.  Patient is adamant that Tylenol  does not work for the headache.  Will try Compazine  alone during the day.  Can consider doing IV Benadryl  and Compazine  tonight if headache persist.  Cannot use triptan.  It is possible that the headache is due to rebleeding after stroke given patient is on rivaroxaban , or potentially 2/2 to cocaine or marijuana withdrawal, or due to poststroke inflammation or due to reinitiation of home medications.  Consider ordering CT w/o contrast if headache gets worse despite pain regimen - PT/OT following, recommending SNF - Scheduled Tylenol  and compazine  for headache - Continue aspirin  81 mg daily - Continue rivaroxaban  20 mg daily - Continue atorvastatin  80 mg daily - Patient has options regarding SNF, will continue to work on this   #CAD status post CABG 2017 Patient has significant CAD.  He has had a CABG in 2017.  Most recent cardiac cath in April 2025 showing multiple stenosed vessels.  He however is not a good candidate for PCI at this time given ongoing substance abuse and noncompliance with medical therapy.  Patient continues to have chest pain, likely ischemic and or vasospasm in the setting of drug use. Educated on the importance of  adherence to medication and avoidance of drugs. - Continue home rivaroxaban  20 mg daily - May be contributing to head so will give half dose ranolazine  500  mg BID today and titrate up (home dose 1000 mg BID) - Continue aspirin  81 mg daily - Follow-up cardiology outpatient   #HFrecEF Echocardiogram showing improved ejection fraction to 50 to 55% on 8/17.  Did resume GDMT on admission. In retrospect, should have restarted isosorbide  mononitrate (and ranolazine ) at a lower dose and titrated up.  This medicine likely contributed to his headache.  Discussed this with the patient. Patient given 30 mg of isosorbide  mononitrate today, will gradually titrate up. - Continue carvedilol  6.25 mg daily - Will start isosorbide  mononitrate 30 mg today and titrate up (home dose 120 mg daily) - Continue losartan  50 mg daily - Continue rivaroxaban  20 mg daily - Encouraged adherence to his medications and avoidance of substances   #CKD stage II/IIIa Patient seems to be approaching baseline.  Creatinine 1.54 today.  Patient urinary output 1 L overnight. - Monitor urine output - Continue monitoring BMP   #Hyperlipidemia LDL at 193.  Not at goal. Goal will be less than 70. - Continue atorvastatin  80 mg daily - Follow-up lipid panel outpatient   #PAD Patient has past medical history of vascular disease and also has had a femoral bypass in the past.  No acute concerns during hospitalization.  Encouraged compliance. - Continue atorvastatin  80 mg daily - Continue rivaroxaban  20 mg daily - Continue cilostazol  100 mg twice daily   #Aortic dilatation CT showing evidence of ascending aortic dilatation as well as infrarenal abdominal aortic dilatation.  Blood pressures are not consistently below, systolics ranging 90-140s over the last 2 days. Will continue to trend and can consider increasing losartan  - Ascending aortic dilatation, warrants f/u w CTA in 1 year.  - Infrarenal abdominal aortic dilatation, warrants  f/u w US  at 3 years.  - Strict blood pressure control, goal systolic <120   #Cocaine use Cocaine use contributing to longstanding hypertension likely contributing to the poor vascular disease he has now.  Substance abuse counseling provided.  TOC provided information on AVS.  Patient encouraged to practice avoidance when he leaves the hospital - Continue education - Encourage cessation of cocaine use  Best Practice: Diet: Regular diet VTE: Rivaroxaban  Code: Full  Disposition planning: Therapy Recs: SNF, DME: none DISPO: Anticipated discharge tomorrow to Home pending Insurance for SNF coverage.  Signature:  Viktoria Charmayne [facility]  6:07 AM, 10/17/2023  On Call pager [phone]

## 2023-10-17 NOTE — Plan of Care (Signed)

## 2023-10-17 NOTE — Progress Notes (Signed)
 Occupational Therapy Treatment Patient Details Name: HENOK HEACOCK MRN: [id number] DOB: Mar 10, 1965 Today's Date: 10/17/2023   History of present illness Pt is a 58 y/o male presenting on 8/14 with L sided chest pain, R UE numbness and dizziness. MRI with punctate acute cortical infarct in L superior perirolandic sensory area. PMH includes: CAD s/p CABG 2017 and 2021, PAD s/p R fem/pop bypass, cocaine abuse, DDD, anxiety, non compliance with medications.   OT comments  Patient with continued progress toward patient focused goals.  Patient self reporting improved sensation and coordination to R upper extremity.  Patient has a tendency to incorporate R hand into tasks.  Issued squeeze ball and red foam to encourage use during grooming ans self feeding.  Overall needing up to CGA for mobility and setup for ADL from a sit to stand level.  SNF has been recommended for post acute rehab to increased independence and safety.  OT will continue efforts in the acute setting to address deficits.         If plan is discharge home, recommend the following:  A little help with walking and/or transfers;A little help with bathing/dressing/bathroom;Assistance with cooking/housework;Assist for transportation;Help with stairs or ramp for entrance;Direct supervision/assist for financial management;Direct supervision/assist for medications management   Equipment Recommendations       Recommendations for Other Services      Precautions / Restrictions Precautions Precautions: Fall Restrictions Weight Bearing Restrictions Per Provider Order: No       Mobility Bed Mobility Overal bed mobility: Modified Independent                  Transfers Overall transfer level: Needs assistance Equipment used: Straight cane Transfers: Sit to/from Stand, Bed to chair/wheelchair/BSC Sit to Stand: Supervision     Step pivot transfers: Supervision, Contact guard assist           Balance Overall balance  assessment: Mild deficits observed, not formally tested, History of Falls                                         ADL either performed or assessed with clinical judgement   ADL       Grooming: Set up;Sitting           Upper Body Dressing : Set up;Sitting   Lower Body Dressing: Contact guard assist;Sit to/from stand   Toilet Transfer: Contact guard assist                  Extremity/Trunk Assessment Upper Extremity Assessment RUE Deficits / Details: grossly 3/5 MMT, decreased FMC, tingling sensation improving. RUE Sensation: decreased light touch RUE Coordination: decreased fine motor   Lower Extremity Assessment Lower Extremity Assessment: Defer to PT evaluation        Vision   Vision Assessment?: No apparent visual deficits   Perception Perception Perception: Within Functional Limits   Praxis Praxis Praxis: WFL   Communication Communication Communication: No apparent difficulties   Cognition Arousal: Alert Behavior During Therapy: WFL for tasks assessed/performed Cognition: No apparent impairments                               Following commands: Intact        Cueing   Cueing Techniques: Verbal cues  Exercises      Shoulder Instructions  General Comments      Pertinent Vitals/ Pain       Pain Assessment Faces Pain Scale: Hurts a little bit Pain Location: chronic knee pain Pain Descriptors / Indicators: Aching Pain Intervention(s): Monitored during session                                                          Frequency  Min 2X/week        Progress Toward Goals  OT Goals(current goals can now be found in the care plan section)  Progress towards OT goals: Progressing toward goals  Acute Rehab OT Goals OT Goal Formulation: With patient Time For Goal Achievement: 10/28/23 Potential to Achieve Goals: Good  Plan      Co-evaluation                 AM-PAC  OT 6 Clicks Daily Activity     Outcome Measure   Help from another person eating meals?: None Help from another person taking care of personal grooming?: A Little Help from another person toileting, which includes using toliet, bedpan, or urinal?: A Little Help from another person bathing (including washing, rinsing, drying)?: A Little Help from another person to put on and taking off regular upper body clothing?: A Little Help from another person to put on and taking off regular lower body clothing?: A Little 6 Click Score: 19    End of Session    OT Visit Diagnosis: Other abnormalities of gait and mobility (R26.89);Muscle weakness (generalized) (M62.81);History of falling (Z91.81);Other symptoms and signs involving cognitive function   Activity Tolerance Patient tolerated treatment well   Patient Left in chair;with call bell/phone within reach   Nurse Communication Mobility status        Time: 9049-8986 OT Time Calculation (min): 23 min  Charges: OT General Charges $OT Visit: 1 Visit OT Treatments $Self Care/Home Management : 23-37 mins  10/17/2023  RP, OTR/L  Acute Rehabilitation Services  Office:  [phone]   Charlie JONETTA Halsted 10/17/2023, 10:18 AM

## 2023-10-17 NOTE — TOC Progression Note (Addendum)
 Transition of Care [facility]) - Progression Note    Patient Details  Name: Benjamin Espinoza MRN: [id number] Date of Birth: 10/01/1965  Transition of Care [facility]) CM/SW Contact  Almarie CHRISTELLA Goodie, [place] Phone Number: 10/17/2023, 11:51 AM  Clinical Narrative:   CSW met with patient to provide bed offers for SNF. CSW printed out information for patient to review, as he has no way to look up information, and provided to patient to research his options. Patient appreciative. CSW to follow back up with patient on choice.  UPDATE: CSW met with patient again to discuss, patient chose [facility]. CSW confirmed availability with [facility], and contacted CMA to initiate insurance authorization. CSW to follow.    Expected Discharge Plan: Skilled Nursing Facility Barriers to Discharge: Homeless with medical needs, Continued Medical Work up, Active Substance Use - Placement               Expected Discharge Plan and Services     Post Acute Care Choice: Skilled Nursing Facility Living arrangements for the past 2 months: Homeless Shelter                                       Social Drivers of Health (SDOH) Interventions SDOH Screenings   Food Insecurity: Food Insecurity Present (10/13/2023)  Housing: High Risk (10/13/2023)  Transportation Needs: Unmet Transportation Needs (10/13/2023)  Utilities: At Risk (10/13/2023)  Alcohol Screen: Low Risk  (03/25/2023)  Depression (PHQ2-9): Low Risk  (06/09/2023)  Recent Concern: Depression (PHQ2-9) - Medium Risk (03/23/2023)  Financial Resource Strain: High Risk (06/09/2023)  Physical Activity: Inactive (03/25/2023)  Social Connections: Socially Integrated (06/16/2023)  Stress: Stress Concern Present (03/25/2023)  Tobacco Use: High Risk (10/13/2023)  Health Literacy: Adequate Health Literacy (03/25/2023)    Readmission Risk Interventions     No data to display

## 2023-10-17 NOTE — Progress Notes (Signed)
 Physical Therapy Treatment Patient Details Name: Benjamin Espinoza MRN: [id number] DOB: 21-May-1965 Today's Date: 10/17/2023   History of Present Illness Pt is a 58 y/o male presenting on 8/14 with L sided chest pain, R UE numbness and dizziness. MRI with punctate acute cortical infarct in L superior perirolandic sensory area. PMH includes: CAD s/p CABG 2017 and 2021, PAD s/p R fem/pop bypass, cocaine abuse, DDD, anxiety, non compliance with medications.    PT Comments  Pt progressing towards his physical therapy goals. He reports continued RUE sensation impairment, dizziness, and headache. Pt ambulating limited hallway distances with up to min assist. Required frequent standing reset breaks due to dizziness, which improved with eyes closed. Encouraged gaze stabilization. Pt demonstrates impaired static and dynamic balance and decreased gait speed, placing him at high risk for falls. Patient will benefit from continued inpatient follow up therapy, <3 hours/day to address deficits and maximize functional independence.    If plan is discharge home, recommend the following: A little help with walking and/or transfers;Assist for transportation;Help with stairs or ramp for entrance   Can travel by private vehicle     Yes  Equipment Recommendations  Cane    Recommendations for Other Services       Precautions / Restrictions Precautions Precautions: Fall Restrictions Weight Bearing Restrictions Per Provider Order: No     Mobility  Bed Mobility Overal bed mobility: Modified Independent                  Transfers Overall transfer level: Needs assistance Equipment used: Straight cane Transfers: Sit to/from Stand Sit to Stand: Supervision                Ambulation/Gait Ambulation/Gait assistance: Contact guard assist, Min assist Gait Distance (Feet): 60 Feet (60, 60) Assistive device: Straight cane Gait Pattern/deviations: Step-through pattern, Decreased stride length,  Staggering left, Staggering right Gait velocity: decreased     General Gait Details: Pt requiring minA for dynamic balance, verbal cues for smaller step lengths for increased control. Several standing rest break due to headache and feeling lightheaded.   Stairs             Wheelchair Mobility     Tilt Bed    Modified Rankin (Stroke Patients Only) Modified Rankin (Stroke Patients Only) Pre-Morbid Rankin Score: No symptoms Modified Rankin: Moderately severe disability     Balance Overall balance assessment: Mild deficits observed, not formally tested, History of Falls                                          Communication Communication Communication: No apparent difficulties  Cognition Arousal: Alert Behavior During Therapy: WFL for tasks assessed/performed   PT - Cognitive impairments: No apparent impairments                         Following commands: Intact      Cueing Cueing Techniques: Verbal cues  Exercises      General Comments        Pertinent Vitals/Pain Pain Assessment Pain Assessment: Faces Faces Pain Scale: Hurts even more Pain Location: headache Pain Descriptors / Indicators: Headache Pain Intervention(s): Limited activity within patient's tolerance, Monitored during session    Home Living  Prior Function            PT Goals (current goals can now be found in the care plan section) Acute Rehab PT Goals Potential to Achieve Goals: Good Progress towards PT goals: Progressing toward goals    Frequency    Min 2X/week      PT Plan      Co-evaluation              AM-PAC PT 6 Clicks Mobility   Outcome Measure  Help needed turning from your back to your side while in a flat bed without using bedrails?: None Help needed moving from lying on your back to sitting on the side of a flat bed without using bedrails?: None Help needed moving to and from a bed to a  chair (including a wheelchair)?: A Little Help needed standing up from a chair using your arms (e.g., wheelchair or bedside chair)?: A Little Help needed to walk in hospital room?: A Little Help needed climbing 3-5 steps with a railing? : A Little 6 Click Score: 20    End of Session Equipment Utilized During Treatment: Gait belt Activity Tolerance: Patient tolerated treatment well Patient left: in bed;with call bell/phone within reach   PT Visit Diagnosis: Unsteadiness on feet (R26.81);Other abnormalities of gait and mobility (R26.89);History of falling (Z91.81)     Time: 8779-8755 PT Time Calculation (min) (ACUTE ONLY): 24 min  Charges:    $Therapeutic Activity: 23-37 mins PT General Charges $$ ACUTE PT VISIT: 1 Visit                     Aleck Daring, PT, DPT Acute Rehabilitation Services Office [phone]    Alayne ONEIDA Daring 10/17/2023, 1:39 PM

## 2023-10-17 NOTE — Plan of Care (Signed)
  Problem: Education: Goal: Knowledge of General Education information will improve Description: Including pain rating scale, medication(s)/side effects and non-pharmacologic comfort measures Outcome: Progressing   Problem: Clinical Measurements: Goal: Diagnostic test results will improve Outcome: Progressing   Problem: Coping: Goal: Level of anxiety will decrease Outcome: Progressing   Problem: Safety: Goal: Ability to remain free from injury will improve Outcome: Progressing   Problem: Skin Integrity: Goal: Risk for impaired skin integrity will decrease Outcome: Progressing   Problem: Pain Managment: Goal: General experience of comfort will improve and/or be controlled Outcome: Not Progressing

## 2023-10-18 LAB — BASIC METABOLIC PANEL WITH GFR
Anion gap: 5 (ref 5–15)
BUN: 17 mg/dL (ref 6–20)
CO2: 24 mmol/L (ref 22–32)
Calcium: 9.6 mg/dL (ref 8.9–10.3)
Chloride: 106 mmol/L (ref 98–111)
Creatinine, Ser: 1.39 mg/dL — ABNORMAL HIGH (ref 0.61–1.24)
GFR, Estimated: 59 mL/min — ABNORMAL LOW (ref >60)
Glucose, Bld: 98 mg/dL (ref 70–99)
Potassium: 4 mmol/L (ref 3.5–5.1)
Sodium: 135 mmol/L (ref 135–145)

## 2023-10-18 NOTE — Care Management Important Message (Signed)
 Important Message  Patient Details  Name: Benjamin Espinoza MRN: [id number] Date of Birth: 11-04-1965   Important Message Given:  Yes - Medicare IM  Patient left prior to IM delivery will mail a copy to the patient home address.    Marguerite Barba 10/18/2023, 2:32 PM

## 2023-10-18 NOTE — Progress Notes (Signed)
 Social work and MD made aware that patient has signed out AMA.
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
## 2023-10-28 ENCOUNTER — Other Ambulatory Visit: Payer: Self-pay

## 2023-11-06 ENCOUNTER — Other Ambulatory Visit: Payer: Self-pay

## 2023-11-06 ENCOUNTER — Emergency Department (HOSPITAL_COMMUNITY)
Admission: EM | Admit: 2023-11-06 | Discharge: 2023-11-06 | Disposition: A | Discharge status: Home / Self Care | Attending: Emergency Medicine | Admitting: Emergency Medicine

## 2023-11-06 ENCOUNTER — Emergency Department (HOSPITAL_COMMUNITY)

## 2023-11-06 ENCOUNTER — Encounter (HOSPITAL_COMMUNITY): Payer: Self-pay | Admitting: Emergency Medicine

## 2023-11-06 DIAGNOSIS — I509 Heart failure, unspecified: Secondary | ICD-10-CM | POA: Insufficient documentation

## 2023-11-06 DIAGNOSIS — R0789 Other chest pain: Secondary | ICD-10-CM | POA: Diagnosis present

## 2023-11-06 DIAGNOSIS — Z7982 Long term (current) use of aspirin: Secondary | ICD-10-CM | POA: Insufficient documentation

## 2023-11-06 DIAGNOSIS — I110 Hypertensive heart disease with heart failure: Secondary | ICD-10-CM | POA: Insufficient documentation

## 2023-11-06 DIAGNOSIS — I2510 Atherosclerotic heart disease of native coronary artery without angina pectoris: Secondary | ICD-10-CM | POA: Insufficient documentation

## 2023-11-06 DIAGNOSIS — Z79899 Other long term (current) drug therapy: Secondary | ICD-10-CM | POA: Insufficient documentation

## 2023-11-06 DIAGNOSIS — Z91148 Patient's other noncompliance with medication regimen for other reason: Secondary | ICD-10-CM | POA: Diagnosis not present

## 2023-11-06 DIAGNOSIS — Z7901 Long term (current) use of anticoagulants: Secondary | ICD-10-CM | POA: Diagnosis not present

## 2023-11-06 DIAGNOSIS — I2089 Other forms of angina pectoris: Secondary | ICD-10-CM

## 2023-11-06 LAB — BASIC METABOLIC PANEL WITH GFR
Anion gap: 9 (ref 5–15)
BUN: 10 mg/dL (ref 6–20)
CO2: 28 mmol/L (ref 22–32)
Calcium: 10 mg/dL (ref 8.9–10.3)
Chloride: 101 mmol/L (ref 98–111)
Creatinine, Ser: 1.38 mg/dL — ABNORMAL HIGH (ref 0.61–1.24)
GFR, Estimated: 60 mL/min — ABNORMAL LOW (ref >60)
Glucose, Bld: 95 mg/dL (ref 70–99)
Potassium: 4.1 mmol/L (ref 3.5–5.1)
Sodium: 138 mmol/L (ref 135–145)

## 2023-11-06 LAB — CBC
HCT: 43.6 % (ref 39.0–52.0)
Hemoglobin: 14.3 g/dL (ref 13.0–17.0)
MCH: 30.9 pg (ref 26.0–34.0)
MCHC: 32.8 g/dL (ref 30.0–36.0)
MCV: 94.2 fL (ref 80.0–100.0)
Platelets: 269 K/uL (ref 150–400)
RBC: 4.63 MIL/uL (ref 4.22–5.81)
RDW: 14.1 % (ref 11.5–15.5)
WBC: 3.7 K/uL — ABNORMAL LOW (ref 4.0–10.5)
nRBC: 0 % (ref 0.0–0.2)

## 2023-11-06 LAB — I-STAT CHEM 8, ED
BUN: 15 mg/dL (ref 6–20)
Calcium, Ion: 1.18 mmol/L (ref 1.15–1.40)
Chloride: 102 mmol/L (ref 98–111)
Creatinine, Ser: 1.4 mg/dL — ABNORMAL HIGH (ref 0.61–1.24)
Glucose, Bld: 97 mg/dL (ref 70–99)
HCT: 44 % (ref 39.0–52.0)
Hemoglobin: 15 g/dL (ref 13.0–17.0)
Potassium: 4 mmol/L (ref 3.5–5.1)
Sodium: 141 mmol/L (ref 135–145)
TCO2: 26 mmol/L (ref 22–32)

## 2023-11-06 LAB — TROPONIN I (HIGH SENSITIVITY)
Troponin I (High Sensitivity): 97 ng/L — ABNORMAL HIGH (ref <18)
Troponin I (High Sensitivity): 108 ng/L (ref <18)

## 2023-11-06 MED ORDER — CARVEDILOL 3.125 MG PO TABS
6.2500 mg | ORAL_TABLET | Freq: Once | ORAL | Status: AC
  Administered 2023-11-06: 6.25 mg via ORAL
  Filled 2023-11-06: qty 2

## 2023-11-06 MED ORDER — ASPIRIN 81 MG PO CHEW
81.0000 mg | CHEWABLE_TABLET | Freq: Once | ORAL | Status: AC
  Administered 2023-11-06: 81 mg via ORAL
  Filled 2023-11-06: qty 1

## 2023-11-06 MED ORDER — LOSARTAN POTASSIUM 50 MG PO TABS
50.0000 mg | ORAL_TABLET | Freq: Once | ORAL | Status: AC
  Administered 2023-11-06: 50 mg via ORAL
  Filled 2023-11-06: qty 1

## 2023-11-06 MED ORDER — RIVAROXABAN 20 MG PO TABS: 20.0000 mg | ORAL_TABLET | Freq: Every day | ORAL | 0 refills | Status: DC

## 2023-11-06 MED ORDER — ASPIRIN EC 81 MG PO TBEC: 81.0000 mg | DELAYED_RELEASE_TABLET | Freq: Every day | ORAL | 0 refills | Status: DC

## 2023-11-06 MED ORDER — RANOLAZINE ER 1000 MG PO TB12: 1000.0000 mg | ORAL_TABLET | Freq: Two times a day (BID) | ORAL | 0 refills | Status: DC

## 2023-11-06 MED ORDER — GABAPENTIN 300 MG PO CAPS
300.0000 mg | ORAL_CAPSULE | Freq: Once | ORAL | Status: AC
  Administered 2023-11-06: 300 mg via ORAL
  Filled 2023-11-06: qty 1

## 2023-11-06 MED ORDER — RIVAROXABAN 10 MG PO TABS
20.0000 mg | ORAL_TABLET | Freq: Once | ORAL | Status: AC
  Administered 2023-11-06: 20 mg via ORAL
  Filled 2023-11-06: qty 2

## 2023-11-06 MED ORDER — RANOLAZINE ER 500 MG PO TB12
1000.0000 mg | ORAL_TABLET | Freq: Once | ORAL | Status: AC
  Administered 2023-11-06: 1000 mg via ORAL
  Filled 2023-11-06: qty 2

## 2023-11-06 MED ORDER — CARVEDILOL 6.25 MG PO TABS: 6.2500 mg | ORAL_TABLET | Freq: Two times a day (BID) | ORAL | 0 refills | Status: DC

## 2023-11-06 MED ORDER — GABAPENTIN 300 MG PO CAPS: 300.0000 mg | ORAL_CAPSULE | Freq: Three times a day (TID) | ORAL | 0 refills | Status: DC

## 2023-11-06 MED ORDER — ISOSORBIDE MONONITRATE ER 30 MG PO TB24
120.0000 mg | ORAL_TABLET | Freq: Once | ORAL | Status: AC
  Administered 2023-11-06: 120 mg via ORAL
  Filled 2023-11-06: qty 4

## 2023-11-06 MED ORDER — METHOCARBAMOL 500 MG PO TABS
500.0000 mg | ORAL_TABLET | Freq: Once | ORAL | Status: AC
  Administered 2023-11-06: 500 mg via ORAL
  Filled 2023-11-06: qty 1

## 2023-11-06 MED ORDER — ATORVASTATIN CALCIUM 80 MG PO TABS: 80.0000 mg | ORAL_TABLET | Freq: Every day | ORAL | 0 refills | Status: DC

## 2023-11-06 MED ORDER — LOSARTAN POTASSIUM 50 MG PO TABS: 50.0000 mg | ORAL_TABLET | Freq: Every day | ORAL | 0 refills | Status: DC

## 2023-11-06 MED ORDER — ISOSORBIDE MONONITRATE ER 120 MG PO TB24: 120.0000 mg | ORAL_TABLET | Freq: Every day | ORAL | 0 refills | Status: DC

## 2023-11-06 MED ORDER — CILOSTAZOL 100 MG PO TABS: 100.0000 mg | ORAL_TABLET | Freq: Two times a day (BID) | ORAL | 0 refills | Status: AC

## 2023-11-06 NOTE — ED Notes (Signed)
 Pt is leaving for home in no new onset distress. Paper work reviewed with pt no questions at this time. Pt leaving for lobby to await family member for pick up.

## 2023-11-06 NOTE — ED Triage Notes (Signed)
 Pt states L sided chest pain x 1 week that is getting worse.  States pain increases every time he lays down.  Was discharged recently from hospital after being tx for stroke, but has not been taking any medications because he does not know what pharmacy meds have been sent to.

## 2023-11-06 NOTE — Discharge Instructions (Signed)
 Take your medications as directed!  Avoid cocaine and tobacco.  Return if worse.

## 2023-11-06 NOTE — ED Provider Notes (Signed)
 [name] EMERGENCY DEPARTMENT AT [facility] Provider Note   CSN: [id number] Arrival date & time: 11/06/23  1236     Patient presents with: Chest Pain   Benjamin Espinoza is a 58 y.o. male.   Pt is a 58 yo male with pmhx significant for CVA, CAD, tobacco abuse, cocaine abuse, PVD, HTN, and CHF.  Pt was admitted from 8/14 to 8/19 for CVA when he left AMA.  They tried to get ahold of him to send in his medications, but they could not.  So, pt has not taken any meds since then.  He said he has avoided cocaine since d/c.  He's been having left sided cp for about a week.         Prior to Admission medications   Medication Sig Start Date End Date Taking? Authorizing Provider  aspirin  EC 81 MG tablet Take 1 tablet (81 mg total) by mouth daily. 11/06/23   Dean Clarity, MD  atorvastatin  (LIPITOR ) 80 MG tablet Take 1 tablet (80 mg total) by mouth daily. 11/06/23   Dean Clarity, MD  carvedilol  (COREG ) 6.25 MG tablet Take 1 tablet (6.25 mg total) by mouth 2 (two) times daily with a meal. 11/06/23   Dean Clarity, MD  cilostazol  (PLETAL ) 100 MG tablet Take 1 tablet (100 mg total) by mouth 2 (two) times daily. 11/06/23   Dean Clarity, MD  gabapentin  (NEURONTIN ) 300 MG capsule Take 1 capsule (300 mg total) by mouth 3 (three) times daily. 11/06/23   Marrah Vanevery, MD  isosorbide  mononitrate (IMDUR ) 120 MG 24 hr tablet Take 1 tablet (120 mg total) by mouth daily. 11/06/23   Dean Clarity, MD  lidocaine  (LIDODERM ) 5 % Place 1 patch onto the skin daily. Remove & Discard patch within 12 hours or as directed by MD 09/18/23   Armenta Canning, MD  losartan  (COZAAR ) 50 MG tablet Take 1 tablet (50 mg total) by mouth daily. 11/06/23   Dean Clarity, MD  methocarbamol  (ROBAXIN ) 500 MG tablet Take 1 tablet (500 mg total) by mouth 4 (four) times daily as needed for muscle spasms. 09/16/23   Kammerer, Megan L, DO  nitroGLYCERIN  (NITROSTAT ) 0.4 MG SL tablet PLACE 1 TABLET UNDER THE TONGUE EVERY 5  MINUTES AS NEEDED FOR CHEST PAIN. 10/28/23   Madireddy, Alean SAUNDERS, MD  ranolazine  (RANEXA ) 1000 MG SR tablet Take 1 tablet (1,000 mg total) by mouth 2 (two) times daily. 11/06/23   Dean Clarity, MD  rivaroxaban  (XARELTO ) 20 MG TABS tablet Take 1 tablet (20 mg total) by mouth daily with supper. 11/06/23   Dean Clarity, MD    Allergies: Patient has no known allergies.    Review of Systems  Cardiovascular:  Positive for chest pain.  All other systems reviewed and are negative.   Updated Vital Signs BP (!) 164/113   Pulse 84   Temp 98 F (36.7 C)   Resp (!) 22   SpO2 100%   Physical Exam Vitals and nursing note reviewed.  Constitutional:      Appearance: He is well-developed.  HENT:     Head: Normocephalic and atraumatic.  Eyes:     Extraocular Movements: Extraocular movements intact.     Pupils: Pupils are equal, round, and reactive to light.  Cardiovascular:     Rate and Rhythm: Normal rate and regular rhythm.     Heart sounds: Normal heart sounds.  Pulmonary:     Effort: Pulmonary effort is normal.     Breath sounds: Normal breath  sounds.  Abdominal:     General: Bowel sounds are normal.     Palpations: Abdomen is soft.  Musculoskeletal:        General: Normal range of motion.     Cervical back: Normal range of motion and neck supple.  Skin:    General: Skin is warm.     Capillary Refill: Capillary refill takes less than 2 seconds.  Neurological:     General: No focal deficit present.     Mental Status: He is alert and oriented to person, place, and time.  Psychiatric:        Mood and Affect: Mood normal.        Behavior: Behavior normal.     (all labs ordered are listed, but only abnormal results are displayed) Labs Reviewed  BASIC METABOLIC PANEL WITH GFR - Abnormal; Notable for the following components:      Result Value   Creatinine, Ser 1.38 (*)    GFR, Estimated 60 (*)    All other components within normal limits  CBC - Abnormal; Notable for the  following components:   WBC 3.7 (*)    All other components within normal limits  I-STAT CHEM 8, ED - Abnormal; Notable for the following components:   Creatinine, Ser 1.40 (*)    All other components within normal limits  TROPONIN I (HIGH SENSITIVITY) - Abnormal; Notable for the following components:   Troponin I (High Sensitivity) 97 (*)    All other components within normal limits  TROPONIN I (HIGH SENSITIVITY) - Abnormal; Notable for the following components:   Troponin I (High Sensitivity) 108 (*)    All other components within normal limits    EKG: EKG Interpretation Date/Time:  Sunday November 06 2023 12:50:51 EDT Ventricular Rate:  76 PR Interval:  146 QRS Duration:  96 QT Interval:  414 QTC Calculation: 465 R Axis:   80  Text Interpretation: Normal sinus rhythm Left ventricular hypertrophy with repolarization abnormality ( Sokolow-Lyon , Cornell product ) Abnormal ECG When compared with ECG of 13-Oct-2023 09:04, PREVIOUS ECG IS PRESENT No significant change since last tracing Confirmed by Dean Clarity [phone]) on 11/06/2023 1:54:41 PM  Radiology: [facility] Chest 2 View Result Date: 11/06/2023 CLINICAL DATA:  Left-sided chest pain x1 week. EXAM: CHEST - 2 VIEW COMPARISON:  June 15, 2023 FINDINGS: Multiple sternal wires and vascular clips are noted. The heart size and mediastinal contours are within normal limits. Both lungs are clear. A small, stable, benign sclerotic focus is seen within the sixth right rib. The visualized skeletal structures are otherwise unremarkable. IMPRESSION: 1. Evidence of prior median sternotomy/CABG. 2. No acute cardiopulmonary disease. Electronically Signed   By: Suzen Dials M.D.   On: 11/06/2023 13:51     Procedures   Medications Ordered in the ED  aspirin  chewable tablet 81 mg (81 mg Oral Given 11/06/23 1453)  carvedilol  (COREG ) tablet 6.25 mg (6.25 mg Oral Given 11/06/23 1453)  isosorbide  mononitrate (IMDUR ) 24 hr tablet 120 mg (120 mg Oral  Given 11/06/23 1452)  gabapentin  (NEURONTIN ) capsule 300 mg (300 mg Oral Given 11/06/23 1453)  losartan  (COZAAR ) tablet 50 mg (50 mg Oral Given 11/06/23 1454)  methocarbamol  (ROBAXIN ) tablet 500 mg (500 mg Oral Given 11/06/23 1453)  ranolazine  (RANEXA ) 12 hr tablet 1,000 mg (1,000 mg Oral Given 11/06/23 1452)  rivaroxaban  (XARELTO ) tablet 20 mg (20 mg Oral Given 11/06/23 1453)  Medical Decision Making Amount and/or Complexity of Data Reviewed Labs: ordered. Radiology: ordered.  Risk OTC drugs. Prescription drug management.   This patient presents to the ED for concern of cp, this involves an extensive number of treatment options, and is a complaint that carries with it a high risk of complications and morbidity.  The differential diagnosis includes cardiac, pulm, gi, cocaine   Co morbidities that complicate the patient evaluation  CVA, CAD, tobacco abuse, cocaine abuse, PVD, HTN, and CHF   Additional history obtained:  Additional history obtained from epic chart review External records from outside source obtained and reviewed including friend   Lab Tests:  I Ordered, and personally interpreted labs.  The pertinent results include:  cbc nl, bmp nl other than cr sl elevated at 1.38 (stable); trop elevated at 97 and 2nd trop 108.   Imaging Studies ordered:  I ordered imaging studies including cxr  I independently visualized and interpreted imaging which showed   Evidence of prior median sternotomy/CABG.  2. No acute cardiopulmonary disease.        I agree with the radiologist interpretation   Cardiac Monitoring:  The patient was maintained on a cardiac monitor.  I personally viewed and interpreted the cardiac monitored which showed an underlying rhythm of: nsr   Medicines ordered and prescription drug management:  I ordered medication including home meds  for sx  Reevaluation of the patient after these medicines showed that the patient  improved I have reviewed the patients home medicines and have made adjustments as needed   Test Considered:  ct   Problem List / ED Course:  Elevated trop:  2nd trop sl more than first one.  Pt did have a cath in April and has known severe vessel CAD.  Cards recommended medical management, so I don't think an admission would offer this patient anything.  Pt given his meds here and rx sent to his pharmacy.  He knows to avoid cocaine and tobacco.  Return if worse.    Reevaluation:  After the interventions noted above, I reevaluated the patient and found that they have :improved   Social Determinants of Health:  Lives with a GF   Dispostion:  After consideration of the diagnostic results and the patients response to treatment, I feel that the patent would benefit from discharge with outpatient f/u.       Final diagnoses:  Stable angina (HCC)  Noncompliance with medication regimen    ED Discharge Orders          Ordered    aspirin  EC 81 MG tablet  Daily        11/06/23 1406    atorvastatin  (LIPITOR ) 80 MG tablet  Daily        11/06/23 1406    carvedilol  (COREG ) 6.25 MG tablet  2 times daily with meals        11/06/23 1406    cilostazol  (PLETAL ) 100 MG tablet  2 times daily        11/06/23 1406    gabapentin  (NEURONTIN ) 300 MG capsule  3 times daily        11/06/23 1406    isosorbide  mononitrate (IMDUR ) 120 MG 24 hr tablet  Daily        11/06/23 1406    losartan  (COZAAR ) 50 MG tablet  Daily        11/06/23 1406    ranolazine  (RANEXA ) 1000 MG SR tablet  2 times daily        11/06/23  1406    rivaroxaban  (XARELTO ) 20 MG TABS tablet  Daily with supper        11/06/23 1406    Ambulatory referral to Cardiology        11/06/23 1555               Dean Clarity, MD 11/06/23 1558

## 2023-11-09 NOTE — Progress Notes (Signed)
 Chest pain is likely due to vasospasm in the setting of cocaine use

## 2023-11-17 ENCOUNTER — Emergency Department (HOSPITAL_COMMUNITY)
Admission: EM | Admit: 2023-11-17 | Discharge: 2023-11-17 | Discharge status: Against Medical Advice | Attending: Emergency Medicine | Admitting: Emergency Medicine

## 2023-11-17 DIAGNOSIS — R42 Dizziness and giddiness: Secondary | ICD-10-CM | POA: Insufficient documentation

## 2023-11-17 DIAGNOSIS — Z5321 Procedure and treatment not carried out due to patient leaving prior to being seen by health care provider: Secondary | ICD-10-CM | POA: Diagnosis not present

## 2023-11-17 DIAGNOSIS — R0602 Shortness of breath: Secondary | ICD-10-CM | POA: Diagnosis not present

## 2023-11-17 DIAGNOSIS — R531 Weakness: Secondary | ICD-10-CM | POA: Diagnosis not present

## 2023-11-17 DIAGNOSIS — Z8673 Personal history of transient ischemic attack (TIA), and cerebral infarction without residual deficits: Secondary | ICD-10-CM | POA: Insufficient documentation

## 2023-11-17 DIAGNOSIS — R7989 Other specified abnormal findings of blood chemistry: Secondary | ICD-10-CM | POA: Insufficient documentation

## 2023-11-17 NOTE — ED Notes (Signed)
 Pt leaving the ER AMA

## 2023-11-17 NOTE — ED Triage Notes (Signed)
 Pt coming in reporting that he had a stroke 3 weeks ago . Pt reports that he has had high troponin levels. Pt reporting dizziness and feeling like he is going to pass out x 3 days. Pt reporting heart burn that he believes is causing his troponin level to be high.

## 2023-11-18 ENCOUNTER — Other Ambulatory Visit: Payer: Self-pay

## 2023-11-18 ENCOUNTER — Emergency Department (HOSPITAL_COMMUNITY)

## 2023-11-18 ENCOUNTER — Observation Stay (HOSPITAL_COMMUNITY)
Admission: EM | Admit: 2023-11-18 | Discharge: 2023-11-19 | Disposition: A | Discharge status: Home / Self Care | Attending: Internal Medicine | Admitting: Internal Medicine

## 2023-11-18 DIAGNOSIS — H8110 Benign paroxysmal vertigo, unspecified ear: Secondary | ICD-10-CM | POA: Insufficient documentation

## 2023-11-18 DIAGNOSIS — Z7982 Long term (current) use of aspirin: Secondary | ICD-10-CM | POA: Diagnosis not present

## 2023-11-18 DIAGNOSIS — G8929 Other chronic pain: Secondary | ICD-10-CM | POA: Diagnosis not present

## 2023-11-18 DIAGNOSIS — I739 Peripheral vascular disease, unspecified: Secondary | ICD-10-CM | POA: Diagnosis not present

## 2023-11-18 DIAGNOSIS — I5032 Chronic diastolic (congestive) heart failure: Secondary | ICD-10-CM | POA: Insufficient documentation

## 2023-11-18 DIAGNOSIS — F17210 Nicotine dependence, cigarettes, uncomplicated: Secondary | ICD-10-CM | POA: Diagnosis not present

## 2023-11-18 DIAGNOSIS — K5731 Diverticulosis of large intestine without perforation or abscess with bleeding: Secondary | ICD-10-CM | POA: Diagnosis not present

## 2023-11-18 DIAGNOSIS — Z951 Presence of aortocoronary bypass graft: Secondary | ICD-10-CM

## 2023-11-18 DIAGNOSIS — Z7901 Long term (current) use of anticoagulants: Secondary | ICD-10-CM | POA: Diagnosis not present

## 2023-11-18 DIAGNOSIS — I25810 Atherosclerosis of coronary artery bypass graft(s) without angina pectoris: Secondary | ICD-10-CM | POA: Insufficient documentation

## 2023-11-18 DIAGNOSIS — Z79899 Other long term (current) drug therapy: Secondary | ICD-10-CM | POA: Insufficient documentation

## 2023-11-18 DIAGNOSIS — D649 Anemia, unspecified: Secondary | ICD-10-CM | POA: Diagnosis not present

## 2023-11-18 DIAGNOSIS — K2951 Unspecified chronic gastritis with bleeding: Secondary | ICD-10-CM | POA: Insufficient documentation

## 2023-11-18 DIAGNOSIS — I25709 Atherosclerosis of coronary artery bypass graft(s), unspecified, with unspecified angina pectoris: Secondary | ICD-10-CM | POA: Diagnosis present

## 2023-11-18 DIAGNOSIS — F1290 Cannabis use, unspecified, uncomplicated: Secondary | ICD-10-CM | POA: Diagnosis not present

## 2023-11-18 DIAGNOSIS — K922 Gastrointestinal hemorrhage, unspecified: Secondary | ICD-10-CM | POA: Diagnosis not present

## 2023-11-18 DIAGNOSIS — Z8673 Personal history of transient ischemic attack (TIA), and cerebral infarction without residual deficits: Secondary | ICD-10-CM | POA: Insufficient documentation

## 2023-11-18 DIAGNOSIS — J45909 Unspecified asthma, uncomplicated: Secondary | ICD-10-CM | POA: Insufficient documentation

## 2023-11-18 DIAGNOSIS — D62 Acute posthemorrhagic anemia: Secondary | ICD-10-CM | POA: Diagnosis not present

## 2023-11-18 DIAGNOSIS — I130 Hypertensive heart and chronic kidney disease with heart failure and stage 1 through stage 4 chronic kidney disease, or unspecified chronic kidney disease: Secondary | ICD-10-CM | POA: Insufficient documentation

## 2023-11-18 DIAGNOSIS — R079 Chest pain, unspecified: Secondary | ICD-10-CM | POA: Diagnosis present

## 2023-11-18 DIAGNOSIS — N1831 Chronic kidney disease, stage 3a: Secondary | ICD-10-CM | POA: Diagnosis present

## 2023-11-18 DIAGNOSIS — M545 Low back pain, unspecified: Secondary | ICD-10-CM

## 2023-11-18 DIAGNOSIS — K5900 Constipation, unspecified: Secondary | ICD-10-CM | POA: Diagnosis not present

## 2023-11-18 DIAGNOSIS — Z87891 Personal history of nicotine dependence: Secondary | ICD-10-CM

## 2023-11-18 DIAGNOSIS — R519 Headache, unspecified: Secondary | ICD-10-CM | POA: Diagnosis not present

## 2023-11-18 DIAGNOSIS — R195 Other fecal abnormalities: Secondary | ICD-10-CM | POA: Diagnosis not present

## 2023-11-18 DIAGNOSIS — D124 Benign neoplasm of descending colon: Secondary | ICD-10-CM | POA: Insufficient documentation

## 2023-11-18 DIAGNOSIS — I2510 Atherosclerotic heart disease of native coronary artery without angina pectoris: Secondary | ICD-10-CM | POA: Diagnosis present

## 2023-11-18 LAB — CBC WITH DIFFERENTIAL/PLATELET
Abs Immature Granulocytes: 0.04 K/uL (ref 0.00–0.07)
Basophils Absolute: 0 K/uL (ref 0.0–0.1)
Basophils Relative: 1 %
Eosinophils Absolute: 0.1 K/uL (ref 0.0–0.5)
Eosinophils Relative: 1 %
HCT: 27.6 % — ABNORMAL LOW (ref 39.0–52.0)
Hemoglobin: 8.8 g/dL — ABNORMAL LOW (ref 13.0–17.0)
Immature Granulocytes: 1 %
Lymphocytes Relative: 26 %
Lymphs Abs: 2.1 K/uL (ref 0.7–4.0)
MCH: 31.4 pg (ref 26.0–34.0)
MCHC: 31.9 g/dL (ref 30.0–36.0)
MCV: 98.6 fL (ref 80.0–100.0)
Monocytes Absolute: 1.1 K/uL — ABNORMAL HIGH (ref 0.1–1.0)
Monocytes Relative: 14 %
Neutro Abs: 4.6 K/uL (ref 1.7–7.7)
Neutrophils Relative %: 57 %
Platelets: 263 K/uL (ref 150–400)
RBC: 2.8 MIL/uL — ABNORMAL LOW (ref 4.22–5.81)
RDW: 15.6 % — ABNORMAL HIGH (ref 11.5–15.5)
WBC: 7.9 K/uL (ref 4.0–10.5)
nRBC: 0.3 % — ABNORMAL HIGH (ref 0.0–0.2)

## 2023-11-18 LAB — PROTIME-INR
INR: 2 — ABNORMAL HIGH (ref 0.8–1.2)
Prothrombin Time: 24.1 s — ABNORMAL HIGH (ref 11.4–15.2)

## 2023-11-18 LAB — URINALYSIS, ROUTINE W REFLEX MICROSCOPIC
Bilirubin Urine: NEGATIVE
Glucose, UA: NEGATIVE mg/dL
Hgb urine dipstick: NEGATIVE
Ketones, ur: NEGATIVE mg/dL
Leukocytes,Ua: NEGATIVE
Nitrite: NEGATIVE
Protein, ur: NEGATIVE mg/dL
Specific Gravity, Urine: 1.021 (ref 1.005–1.030)
pH: 5 (ref 5.0–8.0)

## 2023-11-18 LAB — CBC
HCT: 27.4 % — ABNORMAL LOW (ref 39.0–52.0)
Hemoglobin: 8.7 g/dL — ABNORMAL LOW (ref 13.0–17.0)
MCH: 31.6 pg (ref 26.0–34.0)
MCHC: 31.8 g/dL (ref 30.0–36.0)
MCV: 99.6 fL (ref 80.0–100.0)
Platelets: 256 K/uL (ref 150–400)
RBC: 2.75 MIL/uL — ABNORMAL LOW (ref 4.22–5.81)
RDW: 15.7 % — ABNORMAL HIGH (ref 11.5–15.5)
WBC: 8.5 K/uL (ref 4.0–10.5)
nRBC: 0.2 % (ref 0.0–0.2)

## 2023-11-18 LAB — HEPATIC FUNCTION PANEL
ALT: 9 U/L (ref 0–44)
AST: 12 U/L — ABNORMAL LOW (ref 15–41)
Albumin: 2.7 g/dL — ABNORMAL LOW (ref 3.5–5.0)
Alkaline Phosphatase: 55 U/L (ref 38–126)
Bilirubin, Direct: <0.1 mg/dL (ref 0.0–0.2)
Total Bilirubin: 0.3 mg/dL (ref 0.0–1.2)
Total Protein: 5.9 g/dL — ABNORMAL LOW (ref 6.5–8.1)

## 2023-11-18 LAB — TROPONIN I (HIGH SENSITIVITY)
Troponin I (High Sensitivity): 14 ng/L (ref <18)
Troponin I (High Sensitivity): 14 ng/L (ref <18)

## 2023-11-18 LAB — TECHNOLOGIST SMEAR REVIEW: Plt Morphology: NORMAL

## 2023-11-18 LAB — TYPE AND SCREEN
ABO/RH(D): O POS
Antibody Screen: NEGATIVE

## 2023-11-18 LAB — BASIC METABOLIC PANEL WITH GFR
Anion gap: 7 (ref 5–15)
BUN: 14 mg/dL (ref 6–20)
CO2: 22 mmol/L (ref 22–32)
Calcium: 8.8 mg/dL — ABNORMAL LOW (ref 8.9–10.3)
Chloride: 110 mmol/L (ref 98–111)
Creatinine, Ser: 1.27 mg/dL — ABNORMAL HIGH (ref 0.61–1.24)
GFR, Estimated: >60 mL/min (ref >60)
Glucose, Bld: 97 mg/dL (ref 70–99)
Potassium: 3.6 mmol/L (ref 3.5–5.1)
Sodium: 139 mmol/L (ref 135–145)

## 2023-11-18 LAB — POC OCCULT BLOOD, ED: Fecal Occult Bld: POSITIVE — AB

## 2023-11-18 LAB — RAPID URINE DRUG SCREEN, HOSP PERFORMED
Amphetamines: NOT DETECTED
Barbiturates: NOT DETECTED
Benzodiazepines: NOT DETECTED
Cocaine: NOT DETECTED
Opiates: POSITIVE — AB
Tetrahydrocannabinol: POSITIVE — AB

## 2023-11-18 LAB — RETICULOCYTES
Immature Retic Fract: 26.2 % — ABNORMAL HIGH (ref 2.3–15.9)
RBC.: 2.74 MIL/uL — ABNORMAL LOW (ref 4.22–5.81)
Retic Count, Absolute: 136.7 K/uL (ref 19.0–186.0)
Retic Ct Pct: 5 % — ABNORMAL HIGH (ref 0.4–3.1)

## 2023-11-18 MED ORDER — NA SULFATE-K SULFATE-MG SULF 17.5-3.13-1.6 GM/177ML PO SOLN
0.5000 | Freq: Once | ORAL | Status: AC
  Administered 2023-11-18: 177 mL via ORAL

## 2023-11-18 MED ORDER — NICOTINE 14 MG/24HR TD PT24
14.0000 mg | MEDICATED_PATCH | Freq: Every day | TRANSDERMAL | Status: DC
  Administered 2023-11-18: 14 mg via TRANSDERMAL
  Filled 2023-11-18: qty 1

## 2023-11-18 MED ORDER — PROCHLORPERAZINE EDISYLATE 10 MG/2ML IJ SOLN: 10.0000 mg | Freq: Four times a day (QID) | INTRAMUSCULAR | Status: DC | PRN

## 2023-11-18 MED ORDER — PANTOPRAZOLE SODIUM 40 MG IV SOLR
40.0000 mg | Freq: Once | INTRAVENOUS | Status: AC
  Administered 2023-11-18: 40 mg via INTRAVENOUS
  Filled 2023-11-18: qty 10

## 2023-11-18 MED ORDER — LIDOCAINE 5 % EX PTCH
1.0000 | MEDICATED_PATCH | CUTANEOUS | Status: DC
  Filled 2023-11-18 (×2): qty 1

## 2023-11-18 MED ORDER — RANOLAZINE ER 500 MG PO TB12
1000.0000 mg | ORAL_TABLET | Freq: Two times a day (BID) | ORAL | Status: DC
  Administered 2023-11-18 (×2): 1000 mg via ORAL
  Filled 2023-11-18 (×4): qty 2

## 2023-11-18 MED ORDER — GABAPENTIN 300 MG PO CAPS
300.0000 mg | ORAL_CAPSULE | Freq: Three times a day (TID) | ORAL | Status: DC
  Administered 2023-11-18 (×3): 300 mg via ORAL
  Filled 2023-11-18 (×3): qty 1

## 2023-11-18 MED ORDER — SENNOSIDES-DOCUSATE SODIUM 8.6-50 MG PO TABS: 1.0000 | ORAL_TABLET | Freq: Every evening | ORAL | Status: DC | PRN

## 2023-11-18 MED ORDER — DICLOFENAC SODIUM 1 % EX GEL
2.0000 g | Freq: Four times a day (QID) | CUTANEOUS | Status: DC | PRN
  Administered 2023-11-18: 2 g via TOPICAL
  Filled 2023-11-18: qty 100

## 2023-11-18 MED ORDER — SODIUM CHLORIDE 0.9 % IV SOLN: INTRAVENOUS | Status: AC

## 2023-11-18 MED ORDER — SIMETHICONE 80 MG PO CHEW
240.0000 mg | CHEWABLE_TABLET | Freq: Once | ORAL | Status: AC
  Administered 2023-11-18: 240 mg via ORAL
  Filled 2023-11-18: qty 3

## 2023-11-18 MED ORDER — ACETAMINOPHEN 650 MG RE SUPP: 650.0000 mg | Freq: Four times a day (QID) | RECTAL | Status: DC | PRN

## 2023-11-18 MED ORDER — ATORVASTATIN CALCIUM 80 MG PO TABS
80.0000 mg | ORAL_TABLET | Freq: Every day | ORAL | Status: DC
  Administered 2023-11-18: 80 mg via ORAL
  Filled 2023-11-18: qty 1

## 2023-11-18 MED ORDER — ACETAMINOPHEN 325 MG PO TABS: 650.0000 mg | ORAL_TABLET | Freq: Four times a day (QID) | ORAL | Status: DC | PRN

## 2023-11-18 MED ORDER — PANTOPRAZOLE SODIUM 40 MG IV SOLR
40.0000 mg | Freq: Two times a day (BID) | INTRAVENOUS | Status: DC
  Administered 2023-11-18 (×2): 40 mg via INTRAVENOUS
  Filled 2023-11-18 (×2): qty 10

## 2023-11-18 MED ORDER — NA SULFATE-K SULFATE-MG SULF 17.5-3.13-1.6 GM/177ML PO SOLN
0.5000 | Freq: Once | ORAL | Status: AC
  Administered 2023-11-18: 177 mL via ORAL
  Filled 2023-11-18: qty 1

## 2023-11-18 NOTE — ED Provider Notes (Signed)
 [name] EMERGENCY DEPARTMENT AT [facility] Provider Note   CSN: [id number] Arrival date & time: 11/18/23  0122     Patient presents with: Chest Pain   Benjamin Espinoza is a 58 y.o. male.    58 year old male with hx of CAD s/p CABG x4, HTN, HLD, ICM, CVA, and chronic anticoagulation (on Xarelto ) presents to the emergency department for evaluation of lightheadedness. Symptoms have been persistent x 3 days and worse with position change. He has felt a bit short of breath with symptoms as well. C/o a burning discomfort in his chest when lying flat. Occasionally has noted a sour taste in his mouth. No fevers, syncope, vomiting, increased bleeding or bruising, hematemesis, melena, hematochezia, urinary symptoms. Denies recent cocaine use; does smoke marijuana.  Last took Xarelto  dose the AM of 11/17/23.  No hx of prior colonoscopy.   The history is provided by the patient. No language interpreter was used.  Chest Pain      Prior to Admission medications   Medication Sig Start Date End Date Taking? Authorizing Provider  aspirin  EC 81 MG tablet Take 1 tablet (81 mg total) by mouth daily. 11/06/23   Dean Clarity, MD  atorvastatin  (LIPITOR ) 80 MG tablet Take 1 tablet (80 mg total) by mouth daily. 11/06/23   Dean Clarity, MD  carvedilol  (COREG ) 6.25 MG tablet Take 1 tablet (6.25 mg total) by mouth 2 (two) times daily with a meal. 11/06/23   Dean Clarity, MD  cilostazol  (PLETAL ) 100 MG tablet Take 1 tablet (100 mg total) by mouth 2 (two) times daily. 11/06/23   Dean Clarity, MD  gabapentin  (NEURONTIN ) 300 MG capsule Take 1 capsule (300 mg total) by mouth 3 (three) times daily. 11/06/23   Dean Clarity, MD  isosorbide  mononitrate (IMDUR ) 120 MG 24 hr tablet Take 1 tablet (120 mg total) by mouth daily. 11/06/23   Dean Clarity, MD  lidocaine  (LIDODERM ) 5 % Place 1 patch onto the skin daily. Remove & Discard patch within 12 hours or as directed by MD 09/18/23   Armenta Canning, MD   losartan  (COZAAR ) 50 MG tablet Take 1 tablet (50 mg total) by mouth daily. 11/06/23   Dean Clarity, MD  methocarbamol  (ROBAXIN ) 500 MG tablet Take 1 tablet (500 mg total) by mouth 4 (four) times daily as needed for muscle spasms. 09/16/23   Kammerer, Megan L, DO  nitroGLYCERIN  (NITROSTAT ) 0.4 MG SL tablet PLACE 1 TABLET UNDER THE TONGUE EVERY 5 MINUTES AS NEEDED FOR CHEST PAIN. 10/28/23   Madireddy, Alean SAUNDERS, MD  ranolazine  (RANEXA ) 1000 MG SR tablet Take 1 tablet (1,000 mg total) by mouth 2 (two) times daily. 11/06/23   Dean Clarity, MD  rivaroxaban  (XARELTO ) 20 MG TABS tablet Take 1 tablet (20 mg total) by mouth daily with supper. 11/06/23   Dean Clarity, MD    Allergies: Patient has no known allergies.    Review of Systems  Cardiovascular:  Positive for chest pain.  Ten systems reviewed and are negative for acute change, except as noted in the HPI.    Updated Vital Signs BP 113/76   Pulse 79   Temp 98.6 F (37 C)   Resp 14   Ht 6' (1.829 m)   Wt 88.5 kg   SpO2 99%   BMI 26.45 kg/m   Physical Exam Vitals and nursing note reviewed.  Constitutional:      General: He is not in acute distress.    Appearance: He is well-developed. He is not diaphoretic.  Comments: Nontoxic appearing and in NAD  HENT:     Head: Normocephalic and atraumatic.  Eyes:     General: No scleral icterus.    Conjunctiva/sclera: Conjunctivae normal.  Cardiovascular:     Rate and Rhythm: Normal rate and regular rhythm.     Pulses: Normal pulses.  Pulmonary:     Effort: Pulmonary effort is normal. No respiratory distress.     Comments: Respirations even and unlabored Abdominal:     Palpations: Abdomen is soft.     Comments: Abdomen soft, nontender  Musculoskeletal:        General: Normal range of motion.     Cervical back: Normal range of motion.  Skin:    General: Skin is warm and dry.     Coloration: Skin is not pale.     Findings: No erythema or rash.     Comments: No hematoma or  contusion to back or flank  Neurological:     Mental Status: He is alert and oriented to person, place, and time.     Coordination: Coordination normal.  Psychiatric:        Behavior: Behavior normal.     (all labs ordered are listed, but only abnormal results are displayed) Labs Reviewed  BASIC METABOLIC PANEL WITH GFR - Abnormal; Notable for the following components:      Result Value   Creatinine, Ser 1.27 (*)    Calcium  8.8 (*)    All other components within normal limits  CBC - Abnormal; Notable for the following components:   RBC 2.75 (*)    Hemoglobin 8.7 (*)    HCT 27.4 (*)    RDW 15.7 (*)    All other components within normal limits  PROTIME-INR - Abnormal; Notable for the following components:   Prothrombin Time 24.1 (*)    INR 2.0 (*)    All other components within normal limits  CBC WITH DIFFERENTIAL/PLATELET - Abnormal; Notable for the following components:   RBC 2.80 (*)    Hemoglobin 8.8 (*)    HCT 27.6 (*)    RDW 15.6 (*)    nRBC 0.3 (*)    Monocytes Absolute 1.1 (*)    All other components within normal limits  HEPATIC FUNCTION PANEL - Abnormal; Notable for the following components:   Total Protein 5.9 (*)    Albumin 2.7 (*)    AST 12 (*)    All other components within normal limits  POC OCCULT BLOOD, ED - Abnormal; Notable for the following components:   Fecal Occult Bld POSITIVE (*)    All other components within normal limits  RETICULOCYTES  TECHNOLOGIST SMEAR REVIEW  URINALYSIS, ROUTINE W REFLEX MICROSCOPIC  RAPID URINE DRUG SCREEN, HOSP PERFORMED  TYPE AND SCREEN  TROPONIN I (HIGH SENSITIVITY)  TROPONIN I (HIGH SENSITIVITY)    EKG: EKG Interpretation Date/Time:  Friday November 18 2023 01:31:24 EDT Ventricular Rate:  84 PR Interval:  150 QRS Duration:  93 QT Interval:  382 QTC Calculation: 452 R Axis:   88  Text Interpretation: Sinus rhythm Borderline T abnormalities, lateral leads Confirmed by Jerral Meth [phone]) on 11/18/2023  3:32:22 AM  Radiology: CT HEAD WO CONTRAST ( ) Result Date: 11/18/2023 CLINICAL DATA:  Head trauma, moderate-severe fall on Xarelto  EXAM: CT HEAD WITHOUT CONTRAST TECHNIQUE: Contiguous axial images were obtained from the base of the skull through the vertex without intravenous contrast. RADIATION DOSE REDUCTION: This exam was performed according to the departmental dose-optimization program which includes automated exposure control, adjustment  of the mA and/or kV according to patient size and/or use of iterative reconstruction technique. COMPARISON:  CTA head/neck 10/13/2023. FINDINGS: Brain: No evidence of acute infarction, hemorrhage, hydrocephalus, extra-axial collection or mass lesion/mass effect. Patchy white matter hypodensities are compatible with chronic microvascular ischemic disease. Vascular: No hyperdense vessel. Skull: No acute fracture. Sinuses/Orbits: Clear sinuses.  No acute orbital findings. Other: No mastoid effusions. IMPRESSION: 1. No evidence of acute intracranial abnormality. 2. Chronic microvascular ischemic disease. Electronically Signed   By: Gilmore GORMAN Molt M.D.   On: 11/18/2023 03:20   DG Chest 2 View Result Date: 11/18/2023 CLINICAL DATA:  Chest pain and dizziness EXAM: CHEST - 2 VIEW COMPARISON:  11/06/2023 FINDINGS: Cardiac shadow is stable. Postsurgical changes are noted. Mild aortic calcifications are seen. The lungs are clear bilaterally. No bony abnormality is noted. IMPRESSION: No active cardiopulmonary disease. Electronically Signed   By: Oneil Devonshire M.D.   On: 11/18/2023 02:01     .Critical Care  Performed by: Keith Sor, PA-C Authorized by: Keith Sor, PA-C   Critical care provider statement:    Critical care time (minutes):  30   Critical care time was exclusive of:  Separately billable procedures and treating other patients   Critical care was necessary to treat or prevent imminent or life-threatening deterioration of the following conditions: GI  bleed.   Critical care was time spent personally by me on the following activities:  Development of treatment plan with patient or surrogate, discussions with consultants, evaluation of patient's response to treatment, examination of patient, ordering and review of laboratory studies, ordering and review of radiographic studies, ordering and performing treatments and interventions, pulse oximetry, re-evaluation of patient's condition, review of old charts and obtaining history from patient or surrogate   I assumed direction of critical care for this patient from another provider in my specialty: no     Care discussed with: admitting provider      Medications Ordered in the ED  acetaminophen  (TYLENOL ) tablet 650 mg (has no administration in time range)    Or  acetaminophen  (TYLENOL ) suppository 650 mg (has no administration in time range)  senna-docusate (Senokot-S) tablet 1 tablet (has no administration in time range)  atorvastatin  (LIPITOR ) tablet 80 mg (has no administration in time range)  gabapentin  (NEURONTIN ) capsule 300 mg (has no administration in time range)  ranolazine  (RANEXA ) 12 hr tablet 1,000 mg (has no administration in time range)  pantoprazole  (PROTONIX ) injection 40 mg (has no administration in time range)  lidocaine  (LIDODERM ) 5 % 1 patch (has no administration in time range)  nicotine  (NICODERM CQ  - dosed in mg/24 hours) patch 14 mg (has no administration in time range)  pantoprazole  (PROTONIX ) injection 40 mg (40 mg Intravenous Given 11/18/23 0346)    Clinical Course as of 11/18/23 0504  Fri Nov 18, 2023  0200 RN to repeat CBC for verification given profound anemia in comparison with 2 weeks ago. [KH]  0326 Repeat CBC unchanged. No melena or hematochezia on bedside DRE. However, patient is hemoccult positive suggestive of GIB. No BUN elevation to increase suspicion of UGI source, though stated hx would suggest this.   Troponin is actually normal which is improved from  prior evaluations. [KH]  0335 Last admission appears to be to the IM residency service with d/c on 10/18/23; will page their service again for admission. [KH]  [phone] Spoke with IM teaching service who will assess patient in the ED for admission. [KH]  0503 Dr. Avram of [facility] aware of  patient's admission via secure chat. Their service to see in AM. [KH]    Clinical Course User Index [KH] Keith Sor, PA-C                                 Medical Decision Making Amount and/or Complexity of Data Reviewed Labs: ordered. Radiology: ordered.  Risk Prescription drug management. Decision regarding hospitalization.   This patient presents to the ED for concern of lightheadedness, this involves an extensive number of treatment options, and is a complaint that carries with it a high risk of complications and morbidity.  The differential diagnosis includes hypovolemia vs anemia vs orthostatic hypotension/POTS vs hypoglycemia vs substance use/abuse vs arrhythmia vs CHF exacerbation   Co morbidities that complicate the patient evaluation  CAD HTN HLD ICM   Additional history obtained:  Additional history obtained from EMS personnel External records from outside source obtained and reviewed including MRI from 10/13/23 c/w punctate cortical infarct   Lab Tests:  I Ordered, and personally interpreted labs.  The pertinent results include:  Hgb 8.7 (previously ~14), Creatinine 1.27, hemoccult positive, INR 2.0   Imaging Studies ordered:  I ordered imaging studies including CXR and CT head  I independently visualized and interpreted imaging which showed no acute abnormalities I agree with the radiologist interpretation   Cardiac Monitoring:  The patient was maintained on a cardiac monitor.  I personally viewed and interpreted the cardiac monitored which showed an underlying rhythm of: NSR   Medicines ordered and prescription drug management:  I ordered medication including  protonix  for GIB (presumed upper)  Reevaluation of the patient after these medicines showed that the patient stayed the same I have reviewed the patients home medicines and have made adjustments as needed   Test Considered:  Tagged RBC scan   Critical Interventions:  IV protonix    Consultations Obtained:  I requested consultation with the IM teaching service and discussed lab and imaging findings as well as pertinent plan - they will assess the patient in the ED for admission   Problem List / ED Course:  As above   Reevaluation:  After the interventions noted above, I reevaluated the patient and found that they have :stayed the same   Social Determinants of Health:  Hx polysubstance abuse   Dispostion:  After consideration of the diagnostic results and the patients response to treatment, I feel that the patent would benefit from admission for CBC trending, further evaluation of GI bleed.       Final diagnoses:  Gastrointestinal hemorrhage, unspecified gastrointestinal hemorrhage type    ED Discharge Orders     None          Keith Sor, PA-C 11/18/23 9490    Jerral Meth, MD 11/18/23 [phone]
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
## 2023-11-18 NOTE — Consult Note (Addendum)
 Consultation Note   Referring Provider:   Teaching Service PCP: Patient, No Pcp Per Primary Gastroenterologist:   Sampson     Reason for Consultation:  Anemia / FOBT+ DOA: 11/18/2023         Hospital Day: 1   ASSESSMENT    58 yo male admitted with new symptomatic anemia and FOBT+ on Xarelto  and aspirin  . Presenting hemoglobin 8.7, down from baseline of 15, one week ago. No focal GI symptoms. Rule out bleeding gastrointestinal AVMs, PUD. Neoplasm less likely  CAD s/p CABG Heart Failure ( EF 45-50%) Chronic anticoagulation ( on Xarelto ) Last dose of Xarelto  was 9/18  (in am)  PAD s/p R fem pop bypass  CVA in August   CKD3  Cocaine use  See PMH for any addition medical problems / history    Principal Problem:   Symptomatic anemia Active Problems:   PAD (peripheral artery disease) (HCC)   Chronic kidney disease, stage 3a (HCC)   Chronic anticoagulation   History of CVA (cerebrovascular accident)   Gastrointestinal hemorrhage  PLAN:   Schedule for EGD and colonoscopy tomorrow. The risks and benefits of EGD with possible biopsies and colonoscopy with possible biopsies and removal of polyps were discussed with the patient who agrees to proceed.   HPI   Patient was recently hospitalized with a stroke. TTE without PFO and no events on telemetry while admitted. He was medically ready for discharge to SNF but left AMA.   He returned to ED yesterday with dizziness. Found to have had an acute drop in hgb. No overt bleeding or any sort. No GI symptoms. He has chronically alternating bowel habits ( loose to constipation). Never had colon cancer screening. No FMH of colon cancer. No GERD symptoms. No abdominal pain Weight overall stable at home.   Relevant workup thus far: WBC 8.5 Hemoglobin 8.7 FOBT positive MCV 99.6 Platelets 256 BUN 14  Creatinine 1.27  Albumin 2.7 Troponin 14 Chest x-ray without acute  findings  Pertinent GI Studies   None   Labs and Imaging:  Recent Labs    11/18/23 0341  PROT 5.9*  ALBUMIN 2.7*  AST 12*  ALT 9  ALKPHOS 55  BILITOT 0.3  BILIDIR <0.1  IBILI NOT CALCULATED   Recent Labs    11/18/23 0134 11/18/23 0203  WBC 8.5 7.9  HGB 8.7* 8.8*  HCT 27.4* 27.6*  MCV 99.6 98.6  PLT 256 263   Recent Labs    11/18/23 0134  NA 139  K 3.6  CL 110  CO2 22  GLUCOSE 97  BUN 14  CREATININE 1.27*  CALCIUM  8.8*     CT HEAD WO CONTRAST ( ) CLINICAL DATA:  Head trauma, moderate-severe fall on Xarelto   EXAM: CT HEAD WITHOUT CONTRAST  TECHNIQUE: Contiguous axial images were obtained from the base of the skull through the vertex without intravenous contrast.  RADIATION DOSE REDUCTION: This exam was performed according to the departmental dose-optimization program which includes automated exposure control, adjustment of the mA and/or kV according to patient size and/or use of iterative reconstruction technique.  COMPARISON:  CTA head/neck 10/13/2023.  FINDINGS: Brain: No evidence of acute infarction, hemorrhage, hydrocephalus, extra-axial collection or mass lesion/mass effect. Patchy white matter hypodensities are  compatible with chronic microvascular ischemic disease.  Vascular: No hyperdense vessel.  Skull: No acute fracture.  Sinuses/Orbits: Clear sinuses.  No acute orbital findings.  Other: No mastoid effusions.  IMPRESSION: 1. No evidence of acute intracranial abnormality. 2. Chronic microvascular ischemic disease.  Electronically Signed   By: Gilmore GORMAN Molt M.D.   On: 11/18/2023 03:20 DG Chest 2 View CLINICAL DATA:  Chest pain and dizziness  EXAM: CHEST - 2 VIEW  COMPARISON:  11/06/2023  FINDINGS: Cardiac shadow is stable. Postsurgical changes are noted. Mild aortic calcifications are seen. The lungs are clear bilaterally. No bony abnormality is noted.  IMPRESSION: No active cardiopulmonary  disease.  Electronically Signed   By: Oneil Devonshire M.D.   On: 11/18/2023 02:01   Past Medical History:  Diagnosis Date   Abnormal stress test    Acute left ankle pain 03/23/2023   Anxiety    Asthma    as a child   Atypical chest pain 05/07/2017   Chest pain  Unstable angina 06/15/2023   Chronic bilateral low back pain with left-sided sciatica 01/11/2023   Chronic health problem 06/15/2023   Chronic pain syndrome 03/29/2023   Cocaine abuse (HCC) 06/16/2023   Coronary artery disease    DDD (degenerative disc disease), lumbar 11/08/2019   Formatting of this note might be different from the original. 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 There is mild dilatation of the ascending aorta, measuring 43 mm. There is mild dilatation of the aortic root, measuring 44 mm.      Encounter for medication monitoring 01/10/2023   Encounter for well adult exam with abnormal findings 04/08/2023   Essential hypertension    Heart failure with mid-range ejection fraction (HFmEF) (HCC) 06/17/2023   06/16/23 transthoracic echocardiogram: Left Ventricle: Left ventricular ejection fraction 45 to 50%. The left ventricle has mildly decreased function. No left ventricular hypertrophy. 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 [place]    Hyperlipidemia    Impaired mobility and ADLs 03/23/2023   Ischemic cardiomyopathy 12/17/2020   Lumbar radiculopathy 07/05/2023   Myocardial infarction [facility]) 2017   NSTEMI (non-ST elevated myocardial infarction) (HCC) 11/28/2020   OAB (overactive bladder) 01/11/2023   PAD (peripheral artery disease) (HCC) 09/21/2022   PVD (peripheral vascular disease) (HCC) 09/21/2019   Right leg pain 03/23/2023   Shortness of breath    Sinus bradycardia 11/29/2019   Tobacco use disorder    Unhoused person 06/16/2023   Unstable angina (HCC) 06/15/2023    Past Surgical  History:  Procedure Laterality Date   ABDOMINAL AORTOGRAM W/LOWER EXTREMITY Bilateral 09/21/2019   Procedure: ABDOMINAL AORTOGRAM W/LOWER EXTREMITY;  Surgeon: Eliza Lonni GORMAN, MD;  Location: [facility] INVASIVE CV LAB;  Service: Cardiovascular;  Laterality: Bilateral;   ABDOMINAL AORTOGRAM W/LOWER EXTREMITY N/A 12/07/2019   Procedure: ABDOMINAL AORTOGRAM W/LOWER EXTREMITY;  Surgeon: Eliza Lonni GORMAN, MD;  Location: [facility] INVASIVE CV LAB;  Service: Cardiovascular;  Laterality: N/A;   ABDOMINAL AORTOGRAM W/LOWER EXTREMITY N/A 09/09/2022   Procedure: ABDOMINAL AORTOGRAM W/LOWER EXTREMITY;  Surgeon: Gretta Lonni PARAS, MD;  Location: [facility] INVASIVE CV LAB;  Service: Vascular;  Laterality: N/A;   CARDIAC CATHETERIZATION     CORONARY ARTERY BYPASS GRAFT     quadruple bypas 2017   CORONARY STENT INTERVENTION N/A 11/26/2019   Procedure: CORONARY STENT INTERVENTION;  Surgeon: Mady Lonni, MD;  Location: [facility] INVASIVE CV LAB;  Service: Cardiovascular;  Laterality: N/A;   FEMORAL-POPLITEAL BYPASS GRAFT Right 12/25/2019   Procedure: BYPASS GRAFT FEMORAL- BELOW KNEE POPLITEAL ARTERY RIGHT USING GORE PROPATEN VASCULAR GRAFT;  Surgeon: Eliza Lonni RAMAN, MD;  Location: [facility] OR;  Service: Vascular;  Laterality: Right;   FEMORAL-POPLITEAL BYPASS GRAFT Right 09/21/2022   Procedure: REDO RIGHT LOWER EXTREMITY BYPASS USING GORE PROPATEN 6mm REMOVALBLE RING GRAFT;  Surgeon: Eliza Lonni RAMAN, MD;  Location: [facility] OR;  Service: Vascular;  Laterality: Right;   INTRAOPERATIVE ARTERIOGRAM Right 09/21/2022   Procedure: INTRA OPERATIVE ARTERIOGRAM;  Surgeon: Eliza Lonni RAMAN, MD;  Location: [facility] OR;  Service: Vascular;  Laterality: Right;   LEFT HEART CATH AND CORS/GRAFTS ANGIOGRAPHY N/A 11/26/2019   Procedure: LEFT HEART CATH AND CORS/GRAFTS ANGIOGRAPHY;  Surgeon: Mady Lonni, MD;  Location: [facility] INVASIVE CV LAB;  Service: Cardiovascular;  Laterality: N/A;   LEFT HEART CATH AND CORS/GRAFTS ANGIOGRAPHY N/A  12/01/2020   Procedure: LEFT HEART CATH AND CORS/GRAFTS ANGIOGRAPHY;  Surgeon: Burnard Debby LABOR, MD;  Location: [facility] INVASIVE CV LAB;  Service: Cardiovascular;  Laterality: N/A;   LEFT HEART CATH AND CORS/GRAFTS ANGIOGRAPHY N/A 06/16/2023   Procedure: LEFT HEART CATH AND CORS/GRAFTS ANGIOGRAPHY;  Surgeon: Swaz[place], Peter M, MD;  Location: [facility] INVASIVE CV LAB;  Service: Cardiovascular;  Laterality: N/A;   LOWER EXTREMITY ANGIOGRAM Right 12/25/2019   Procedure: RIGHT LOWER EXTREMITY ANGIOGRAM;  Surgeon: Eliza Lonni RAMAN, MD;  Location: [facility] OR;  Service: Vascular;  Laterality: Right;   PERIPHERAL VASCULAR BALLOON ANGIOPLASTY Left 12/07/2019   Procedure: PERIPHERAL VASCULAR BALLOON ANGIOPLASTY;  Surgeon: Eliza Lonni RAMAN, MD;  Location: [facility] INVASIVE CV LAB;  Service: Cardiovascular;  Laterality: Left;  SFA    Family History  Problem Relation Age of Onset   Heart disease Mother    Heart disease Father    Heart disease Sister     Prior to Admission medications   Medication Sig Start Date End Date Taking? Authorizing Provider  aspirin  EC 81 MG tablet Take 1 tablet (81 mg total) by mouth daily. 11/06/23   Dean Clarity, MD  atorvastatin  (LIPITOR ) 80 MG tablet Take 1 tablet (80 mg total) by mouth daily. 11/06/23   Dean Clarity, MD  carvedilol  (COREG ) 6.25 MG tablet Take 1 tablet (6.25 mg total) by mouth 2 (two) times daily with a meal. 11/06/23   Dean Clarity, MD  cilostazol  (PLETAL ) 100 MG tablet Take 1 tablet (100 mg total) by mouth 2 (two) times daily. 11/06/23   Dean Clarity, MD  gabapentin  (NEURONTIN ) 300 MG capsule Take 1 capsule (300 mg total) by mouth 3 (three) times daily. 11/06/23   Haviland, Julie, MD  isosorbide  mononitrate (IMDUR ) 120 MG 24 hr tablet Take 1 tablet (120 mg total) by mouth daily. 11/06/23   Dean Clarity, MD  lidocaine  (LIDODERM ) 5 % Place 1 patch onto the skin daily. Remove & Discard patch within 12 hours or as directed by MD 09/18/23   Armenta Canning, MD  losartan   (COZAAR ) 50 MG tablet Take 1 tablet (50 mg total) by mouth daily. 11/06/23   Dean Clarity, MD  methocarbamol  (ROBAXIN ) 500 MG tablet Take 1 tablet (500 mg total) by mouth 4 (four) times daily as needed for muscle spasms. 09/16/23   Kammerer, Megan L, DO  nitroGLYCERIN  (NITROSTAT ) 0.4 MG SL tablet PLACE 1 TABLET UNDER THE TONGUE EVERY 5 MINUTES AS NEEDED FOR CHEST PAIN. 10/28/23   Madireddy, Alean SAUNDERS, MD  ranolazine  (RANEXA ) 1000 MG SR tablet Take 1 tablet (1,000 mg total) by mouth 2 (two) times  daily. 11/06/23   Dean Clarity, MD  rivaroxaban  (XARELTO ) 20 MG TABS tablet Take 1 tablet (20 mg total) by mouth daily with supper. 11/06/23   Dean Clarity, MD    Current Facility-Administered Medications  Medication Dose Route Frequency Provider Last Rate Last Admin   acetaminophen  (TYLENOL ) tablet 650 mg  650 mg Oral Q6H PRN Elicia Sharper, DO       Or   acetaminophen  (TYLENOL ) suppository 650 mg  650 mg Rectal Q6H PRN Zheng, Michael, DO       atorvastatin  (LIPITOR ) tablet 80 mg  80 mg Oral Daily Zheng, Michael, DO   80 mg at 11/18/23 9177   diclofenac  Sodium (VOLTAREN ) 1 % topical gel 2 g  2 g Topical QID PRN [name], [name], DO       gabapentin  (NEURONTIN ) capsule 300 mg  300 mg Oral TID Zheng, Michael, DO   300 mg at 11/18/23 9177   lidocaine  (LIDODERM ) 5 % 1 patch  1 patch Transdermal Q24H Zheng, Michael, DO       nicotine  (NICODERM CQ  - dosed in mg/24 hours) patch 14 mg  14 mg Transdermal Daily Zheng, Michael, DO   14 mg at 11/18/23 9177   pantoprazole  (PROTONIX ) injection 40 mg  40 mg Intravenous Q12H Zheng, Michael, DO   40 mg at 11/18/23 9176   prochlorperazine  (COMPAZINE ) injection 10 mg  10 mg Intravenous Q6H PRN Rihner, Emilie, DO       ranolazine  (RANEXA ) 12 hr tablet 1,000 mg  1,000 mg Oral BID Zheng, Michael, DO   1,000 mg at 11/18/23 9177   senna-docusate (Senokot-S) tablet 1 tablet  1 tablet Oral QHS PRN Zheng, Michael, DO        Allergies as of 11/18/2023   (No Known Allergies)     Social History   Socioeconomic History   Marital status: Married    Spouse name: Not on file   Number of children: Not on file   Years of education: Not on file   Highest education level: Not on file  Occupational History   Not on file  Tobacco Use   Smoking status: Every Day    Types: Cigars, Cigarettes    Passive exposure: Current (Wife is a smoker)   Smokeless tobacco: Never   Tobacco comments:    as of 12/20/19 2 cigararettes/day  Vaping Use   Vaping status: Never Used  Substance and Sexual Activity   Alcohol use: No   Drug use: Yes    Types: Marijuana, Cocaine   Sexual activity: Not Currently  Other Topics Concern   Not on file  Social History Narrative   Lives with alone but has a caregiver   Social Drivers of Corporate investment banker Strain: High Risk (06/09/2023)   Overall Financial Resource Strain (CARDIA)    Difficulty of Paying Living Expenses: Very hard  Food Insecurity: Food Insecurity Present (11/18/2023)   Hunger Vital Sign    Worried About Radiation protection practitioner of Food in the Last Year: Sometimes true    Ran Out of Food in the Last Year: Sometimes true  Transportation Needs: Unmet Transportation Needs (11/18/2023)   PRAPARE - Administrator, Civil Service (Medical): Yes    Lack of Transportation (Non-Medical): No  Physical Activity: Inactive (03/25/2023)   Exercise Vital Sign    Days of Exercise per Week: 0 days    Minutes of Exercise per Session: 0 min  Stress: Stress Concern Present (03/25/2023)   [employer] of Occupational  Health - Occupational Stress Questionnaire    Feeling of Stress : Rather much  Social Connections: Socially Integrated (06/16/2023)   Social Connection and Isolation Panel    Frequency of Communication with Friends and Family: More than three times a week    Frequency of Social Gatherings with Friends and Family: Never    Attends Religious Services: More than 4 times per year    Active Member of [employer] or  Organizations: Yes    Attends Banker Meetings: Never    Marital Status: Married  Catering manager Violence: Not At Risk (11/18/2023)   Humiliation, Afraid, Rape, and Kick questionnaire    Fear of Current or Ex-Partner: No    Emotionally Abused: No    Physically Abused: No    Sexually Abused: No     Code Status   Code Status: Full Code  Review of Systems: All systems reviewed and negative except where noted in HPI.  Physical Exam: Vital signs in last 24 hours: Temp:  [98.1 F (36.7 C)-98.8 F (37.1 C)] 98.2 F (36.8 C) (09/19 0740) Pulse Rate:  [68-101] 76 (09/19 0740) Resp:  [10-18] 18 (09/19 0740) BP: (108-159)/(76-108) 121/88 (09/19 0740) SpO2:  [91 %-100 %] 100 % (09/19 0740) Weight:  [88.5 kg] 88.5 kg (09/19 0128) Last BM Date : 11/17/23  General:  Pleasant male in NAD Psych:  Cooperative. Normal mood and affect Eyes: Pupils equal Ears:  Normal auditory acuity Nose: No deformity, discharge or lesions Neck:  Supple, no masses felt Lungs:  Clear to auscultation.  Heart:  Regular rate, regular rhythm.  Abdomen:  Soft, nondistended, nontender, active bowel sounds, no masses felt Rectal :  Deferred Msk: Symmetrical without gross deformities.  Neurologic:  Alert, oriented, grossly normal neurologically Extremities : No edema Skin:  Intact without significant lesions.    Intake/Output from previous day: 09/18 0701 - 09/19 0700 In: 650 [I.V.:650] Out: -  Intake/Output this shift:  Total I/O In: -  Out: 350 [Urine:350]   Vina Dasen, NP-C   11/18/2023, 10:23 AM  -----------------------------------------------------------------  I have taken a history, reviewed the chart and examined the patient. I performed a substantive portion of this encounter, including complete performance of at least one of the key components, in conjunction with the APP. I agree with the APP's note, impression and recommendations  58 year old male with history of CAD  status post CABG, CHF, history of CVA, CKD 3, peripheral artery disease status post femoropopliteal bypass on Xarelto  (last dose morning of 9/18) admitted with symptomatic anemia without overt GI blood loss, but with positive fecal occult blood test.  Hemoglobin 8.7 from a baseline of fifteen 1 week ago.  No prior endoscopic evaluation.  Patient reports chronic irregular bowel habits/loose stools and crampy abdominal pain, but no recent changes in GI symptoms or stool appearance.  Given absence of overt GI bleeding, will plan to proceed with upper and lower endoscopy tomorrow.  Patient has significant coronary artery disease, with cath in April showing severe disease not amenable to intervention, medical therapy recommended.  He denies any symptoms of chest pain/pressure, but he did have these symptoms a few weeks ago and had troponin elevation.  Troponins normal on admission today.  Patient is high risk for sedation given his underlying comorbidities, but given his significant drop in hemoglobin and no active concerning cardiac symptoms, benefits of procedure outweigh the risk.  If no bleeding source identified, would recommend capsule endoscopy next.  Anes Rigel E. Stacia, MD [facility]  High  complex medical decision making (this includes chart review, review of results, face-to-face time used for counseling as well as treatment plan and follow-up. The patient was provided an opportunity to ask questions and all were answered. The patient agreed with the plan and demonstrated an understanding of the instructions

## 2023-11-18 NOTE — Care Management Obs Status (Signed)
 MEDICARE OBSERVATION STATUS NOTIFICATION   Patient Details  Name: Benjamin Espinoza MRN: [id number] Date of Birth: [birth year]   Medicare Observation Status Notification Given:     Obs notice signed and copy given   Claretta Deed 11/18/2023, 11:35 AM

## 2023-11-18 NOTE — ED Notes (Signed)
 Attempted to start 2nd IV per order, pt not agreeable to having a second IV placed at this time unless needed.

## 2023-11-18 NOTE — TOC Initial Note (Signed)
 Transition of Care [facility]) - Initial/Assessment Note    Patient Details  Name: Benjamin Espinoza MRN: [id number] Date of Birth: 03/25/1965  Transition of [facility]) CM/SW Contact:    Lauraine FORBES Saa, LCSWA Phone Number: 11/18/2023, 2:36 PM  Clinical Narrative:                  2:36 PM CSW introduced self and role to patient. Patient informed CSW that he resides at home with spouse who would provide transportation at discharge. Patient confirmed SDOH (food, transportation) needs. CSW provided SDOH resources. Per chart review, has insurance but does not have a PCP. Patient does not have SNF history. Patient has HH history with [facility]. Patient has DME (RW) history with [facility]. Patient's preferred pharmacy's are [facility] [address], [facility] [address] [facility], [facility], and [facility] [phone] [facility]. TOC will continue to follow and be available to assist.  Expected Discharge Plan: Home/Self Care Barriers to Discharge: Continued Medical Work up   Patient Goals and CMS Choice            Expected Discharge Plan and Services       Living arrangements for the past 2 months: Single Family Home                                      Prior Living Arrangements/Services Living arrangements for the past 2 months: Single Family Home Lives with:: Spouse Patient language and need for interpreter reviewed:: Yes        Need for Family Participation in Patient Care: No (Comment)   Current home services: DME Criminal Activity/Legal Involvement Pertinent to Current Situation/Hospitalization: No - Comment as needed  Activities of Daily Living      Permission Sought/Granted Permission sought to share information with : Family Supports Permission granted to share information with : No (Contact information on chart)  Share Information with NAME: Durelle Zepeda     Permission granted to share info w Relationship:  Sister  Permission granted to share info w Contact Information: [phone]  Emotional Assessment Appearance:: Appears stated age Attitude/Demeanor/Rapport: Engaged Affect (typically observed): Accepting, Adaptable, Pleasant, Stable, Appropriate, Calm Orientation: : Oriented to Self, Oriented to  Time, Oriented to Place, Oriented to Situation Alcohol / Substance Use: Not Applicable Psych Involvement: No (comment)  Admission diagnosis:  Symptomatic anemia [D64.9] Gastrointestinal hemorrhage, unspecified gastrointestinal hemorrhage type [K92.2] Patient Active Problem List   Diagnosis Date Noted   Symptomatic anemia 11/18/2023   Chronic anticoagulation 11/18/2023   History of CVA (cerebrovascular accident) 11/18/2023   Gastrointestinal hemorrhage 11/18/2023   Positive fecal occult blood test 11/18/2023   Acute CVA (cerebrovascular accident) (HCC) 10/13/2023   Chronic kidney disease, stage 3a (HCC) 10/13/2023   Aortic dilatation (HCC) 10/13/2023   Lumbar radiculopathy 07/05/2023   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   Heart failure with mid-range ejection fraction (HFmEF) (HCC) 06/17/2023   Cocaine abuse (HCC) 06/16/2023   Unhoused person 06/16/2023   Chest pain  Unstable angina 06/15/2023   Chronic health problem 06/15/2023   Unstable angina (HCC) 06/15/2023   Encounter for well adult exam with abnormal findings 04/08/2023   Chronic pain syndrome 03/29/2023   Acute left ankle pain 03/23/2023   Impaired mobility and ADLs 03/23/2023   Right leg pain 03/23/2023   Chronic bilateral low back pain with left-sided sciatica 01/11/2023   OAB (overactive  bladder) 01/11/2023   Encounter for medication monitoring 01/10/2023   PAD (peripheral artery disease) (HCC) 09/21/2022   Ischemic cardiomyopathy 12/17/2020   NSTEMI (non-ST elevated myocardial infarction) (HCC) 11/28/2020   Anxiety    Asthma    Atherosclerosis of native coronary artery of native heart without angina pectoris     Sinus bradycardia 11/29/2019   Abnormal stress test    Shortness of breath    DDD (degenerative disc disease), lumbar 11/08/2019   Essential hypertension    Hx of CABG    Hyperlipidemia    Myocardial infarction [facility])    Tobacco use disorder    PVD (peripheral vascular disease) (HCC) 09/21/2019   Atypical chest pain 05/07/2017   PCP:  Patient, No Pcp Per Pharmacy:   [facility] [address], [place] - [address] [place] [address] Phone: [phone] Fax: [phone]  [place] [facility] [address] [place] [address] Phone: [phone] Fax: [phone]  [facility] [address] [place] [address] Phone: [phone] Fax: [phone]  [id number] [place] [place] - [address] [place] [address] Phone: [phone] Fax: [phone]  [id number] - [place] - [address] [place] [address] Phone: [phone] Fax: [phone]     Social Drivers of Health (SDOH) Social History: SDOH Screenings   Food Insecurity: Food Insecurity Present (11/18/2023)  Housing: Unknown (11/18/2023)  Recent Concern: Housing - High Risk (10/13/2023)  Transportation Needs: Unmet Transportation Needs (11/18/2023)  Utilities: Not At Risk (11/18/2023)  Recent Concern: Utilities - At Risk (10/13/2023)  Alcohol Screen: Low Risk  (03/25/2023)  Depression (PHQ2-9): Low Risk  (06/09/2023)  Recent Concern: Depression (PHQ2-9) - Medium Risk (03/23/2023)  Financial Resource Strain: High Risk (06/09/2023)  Physical Activity: Inactive (03/25/2023)  Social Connections: Socially Integrated (06/16/2023)  Stress: Stress Concern Present (03/25/2023)  Tobacco Use: High Risk (11/06/2023)  Health Literacy: Adequate Health Literacy (03/25/2023)   SDOH Interventions: Food Insecurity Interventions:  [employer] Provided, Inpatient [employer] Transportation Interventions: [employer] Provided, Associate Professor, Inpatient TOC, Patient Resources (Friends/Family)   Readmission Risk Interventions     No data to display

## 2023-11-18 NOTE — H&P (View-Only) (Signed)
 Consultation Note   Referring Provider:   Teaching Service PCP: Patient, No Pcp Per Primary Gastroenterologist:   Sampson     Reason for Consultation:  Anemia / FOBT+ DOA: 11/18/2023         Hospital Day: 1   ASSESSMENT    58 yo male admitted with new symptomatic anemia and FOBT+ on Xarelto  and aspirin  . Presenting hemoglobin 8.7, down from baseline of 15, one week ago. No focal GI symptoms. Rule out bleeding gastrointestinal AVMs, PUD. Neoplasm less likely  CAD s/p CABG Heart Failure ( EF 45-50%) Chronic anticoagulation ( on Xarelto ) Last dose of Xarelto  was 9/18  (in am)  PAD s/p R fem pop bypass  CVA in August   CKD3  Cocaine use  See PMH for any addition medical problems / history    Principal Problem:   Symptomatic anemia Active Problems:   PAD (peripheral artery disease) (HCC)   Chronic kidney disease, stage 3a (HCC)   Chronic anticoagulation   History of CVA (cerebrovascular accident)   Gastrointestinal hemorrhage  PLAN:   Schedule for EGD and colonoscopy tomorrow. The risks and benefits of EGD with possible biopsies and colonoscopy with possible biopsies and removal of polyps were discussed with the patient who agrees to proceed.   HPI   Patient was recently hospitalized with a stroke. TTE without PFO and no events on telemetry while admitted. He was medically ready for discharge to SNF but left AMA.   He returned to ED yesterday with dizziness. Found to have had an acute drop in hgb. No overt bleeding or any sort. No GI symptoms. He has chronically alternating bowel habits ( loose to constipation). Never had colon cancer screening. No FMH of colon cancer. No GERD symptoms. No abdominal pain Weight overall stable at home.   Relevant workup thus far: WBC 8.5 Hemoglobin 8.7 FOBT positive MCV 99.6 Platelets 256 BUN 14  Creatinine 1.27  Albumin 2.7 Troponin 14 Chest x-ray without acute  findings  Pertinent GI Studies   None   Labs and Imaging:  Recent Labs    11/18/23 0341  PROT 5.9*  ALBUMIN 2.7*  AST 12*  ALT 9  ALKPHOS 55  BILITOT 0.3  BILIDIR <0.1  IBILI NOT CALCULATED   Recent Labs    11/18/23 0134 11/18/23 0203  WBC 8.5 7.9  HGB 8.7* 8.8*  HCT 27.4* 27.6*  MCV 99.6 98.6  PLT 256 263   Recent Labs    11/18/23 0134  NA 139  K 3.6  CL 110  CO2 22  GLUCOSE 97  BUN 14  CREATININE 1.27*  CALCIUM  8.8*     CT HEAD WO CONTRAST ( ) CLINICAL DATA:  Head trauma, moderate-severe fall on Xarelto   EXAM: CT HEAD WITHOUT CONTRAST  TECHNIQUE: Contiguous axial images were obtained from the base of the skull through the vertex without intravenous contrast.  RADIATION DOSE REDUCTION: This exam was performed according to the departmental dose-optimization program which includes automated exposure control, adjustment of the mA and/or kV according to patient size and/or use of iterative reconstruction technique.  COMPARISON:  CTA head/neck 10/13/2023.  FINDINGS: Brain: No evidence of acute infarction, hemorrhage, hydrocephalus, extra-axial collection or mass lesion/mass effect. Patchy white matter hypodensities are  compatible with chronic microvascular ischemic disease.  Vascular: No hyperdense vessel.  Skull: No acute fracture.  Sinuses/Orbits: Clear sinuses.  No acute orbital findings.  Other: No mastoid effusions.  IMPRESSION: 1. No evidence of acute intracranial abnormality. 2. Chronic microvascular ischemic disease.  Electronically Signed   By: Gilmore GORMAN Molt M.D.   On: 11/18/2023 03:20 DG Chest 2 View CLINICAL DATA:  Chest pain and dizziness  EXAM: CHEST - 2 VIEW  COMPARISON:  11/06/2023  FINDINGS: Cardiac shadow is stable. Postsurgical changes are noted. Mild aortic calcifications are seen. The lungs are clear bilaterally. No bony abnormality is noted.  IMPRESSION: No active cardiopulmonary  disease.  Electronically Signed   By: Oneil Devonshire M.D.   On: 11/18/2023 02:01   Past Medical History:  Diagnosis Date   Abnormal stress test    Acute left ankle pain 03/23/2023   Anxiety    Asthma    as a child   Atypical chest pain 05/07/2017   Chest pain  Unstable angina 06/15/2023   Chronic bilateral low back pain with left-sided sciatica 01/11/2023   Chronic health problem 06/15/2023   Chronic pain syndrome 03/29/2023   Cocaine abuse (HCC) 06/16/2023   Coronary artery disease    DDD (degenerative disc disease), lumbar 11/08/2019   Formatting of this note might be different from the original. 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 There is mild dilatation of the ascending aorta, measuring 43 mm. There is mild dilatation of the aortic root, measuring 44 mm.      Encounter for medication monitoring 01/10/2023   Encounter for well adult exam with abnormal findings 04/08/2023   Essential hypertension    Heart failure with mid-range ejection fraction (HFmEF) (HCC) 06/17/2023   06/16/23 transthoracic echocardiogram: Left Ventricle: Left ventricular ejection fraction 45 to 50%. The left ventricle has mildly decreased function. No left ventricular hypertrophy. 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 [place]    Hyperlipidemia    Impaired mobility and ADLs 03/23/2023   Ischemic cardiomyopathy 12/17/2020   Lumbar radiculopathy 07/05/2023   Myocardial infarction [facility]) 2017   NSTEMI (non-ST elevated myocardial infarction) (HCC) 11/28/2020   OAB (overactive bladder) 01/11/2023   PAD (peripheral artery disease) (HCC) 09/21/2022   PVD (peripheral vascular disease) (HCC) 09/21/2019   Right leg pain 03/23/2023   Shortness of breath    Sinus bradycardia 11/29/2019   Tobacco use disorder    Unhoused person 06/16/2023   Unstable angina (HCC) 06/15/2023    Past Surgical  History:  Procedure Laterality Date   ABDOMINAL AORTOGRAM W/LOWER EXTREMITY Bilateral 09/21/2019   Procedure: ABDOMINAL AORTOGRAM W/LOWER EXTREMITY;  Surgeon: Eliza Lonni GORMAN, MD;  Location: [facility] INVASIVE CV LAB;  Service: Cardiovascular;  Laterality: Bilateral;   ABDOMINAL AORTOGRAM W/LOWER EXTREMITY N/A 12/07/2019   Procedure: ABDOMINAL AORTOGRAM W/LOWER EXTREMITY;  Surgeon: Eliza Lonni GORMAN, MD;  Location: [facility] INVASIVE CV LAB;  Service: Cardiovascular;  Laterality: N/A;   ABDOMINAL AORTOGRAM W/LOWER EXTREMITY N/A 09/09/2022   Procedure: ABDOMINAL AORTOGRAM W/LOWER EXTREMITY;  Surgeon: Gretta Lonni PARAS, MD;  Location: [facility] INVASIVE CV LAB;  Service: Vascular;  Laterality: N/A;   CARDIAC CATHETERIZATION     CORONARY ARTERY BYPASS GRAFT     quadruple bypas 2017   CORONARY STENT INTERVENTION N/A 11/26/2019   Procedure: CORONARY STENT INTERVENTION;  Surgeon: Mady Lonni, MD;  Location: [facility] INVASIVE CV LAB;  Service: Cardiovascular;  Laterality: N/A;   FEMORAL-POPLITEAL BYPASS GRAFT Right 12/25/2019   Procedure: BYPASS GRAFT FEMORAL- BELOW KNEE POPLITEAL ARTERY RIGHT USING GORE PROPATEN VASCULAR GRAFT;  Surgeon: Eliza Lonni RAMAN, MD;  Location: [facility] OR;  Service: Vascular;  Laterality: Right;   FEMORAL-POPLITEAL BYPASS GRAFT Right 09/21/2022   Procedure: REDO RIGHT LOWER EXTREMITY BYPASS USING GORE PROPATEN 6mm REMOVALBLE RING GRAFT;  Surgeon: Eliza Lonni RAMAN, MD;  Location: [facility] OR;  Service: Vascular;  Laterality: Right;   INTRAOPERATIVE ARTERIOGRAM Right 09/21/2022   Procedure: INTRA OPERATIVE ARTERIOGRAM;  Surgeon: Eliza Lonni RAMAN, MD;  Location: [facility] OR;  Service: Vascular;  Laterality: Right;   LEFT HEART CATH AND CORS/GRAFTS ANGIOGRAPHY N/A 11/26/2019   Procedure: LEFT HEART CATH AND CORS/GRAFTS ANGIOGRAPHY;  Surgeon: Mady Lonni, MD;  Location: [facility] INVASIVE CV LAB;  Service: Cardiovascular;  Laterality: N/A;   LEFT HEART CATH AND CORS/GRAFTS ANGIOGRAPHY N/A  12/01/2020   Procedure: LEFT HEART CATH AND CORS/GRAFTS ANGIOGRAPHY;  Surgeon: Burnard Debby LABOR, MD;  Location: [facility] INVASIVE CV LAB;  Service: Cardiovascular;  Laterality: N/A;   LEFT HEART CATH AND CORS/GRAFTS ANGIOGRAPHY N/A 06/16/2023   Procedure: LEFT HEART CATH AND CORS/GRAFTS ANGIOGRAPHY;  Surgeon: [place], Peter M, MD;  Location: [facility] INVASIVE CV LAB;  Service: Cardiovascular;  Laterality: N/A;   LOWER EXTREMITY ANGIOGRAM Right 12/25/2019   Procedure: RIGHT LOWER EXTREMITY ANGIOGRAM;  Surgeon: Eliza Lonni RAMAN, MD;  Location: [facility] OR;  Service: Vascular;  Laterality: Right;   PERIPHERAL VASCULAR BALLOON ANGIOPLASTY Left 12/07/2019   Procedure: PERIPHERAL VASCULAR BALLOON ANGIOPLASTY;  Surgeon: Eliza Lonni RAMAN, MD;  Location: [facility] INVASIVE CV LAB;  Service: Cardiovascular;  Laterality: Left;  SFA    Family History  Problem Relation Age of Onset   Heart disease Mother    Heart disease Father    Heart disease Sister     Prior to Admission medications   Medication Sig Start Date End Date Taking? Authorizing Provider  aspirin  EC 81 MG tablet Take 1 tablet (81 mg total) by mouth daily. 11/06/23   Dean Clarity, MD  atorvastatin  (LIPITOR ) 80 MG tablet Take 1 tablet (80 mg total) by mouth daily. 11/06/23   Dean Clarity, MD  carvedilol  (COREG ) 6.25 MG tablet Take 1 tablet (6.25 mg total) by mouth 2 (two) times daily with a meal. 11/06/23   Dean Clarity, MD  cilostazol  (PLETAL ) 100 MG tablet Take 1 tablet (100 mg total) by mouth 2 (two) times daily. 11/06/23   Dean Clarity, MD  gabapentin  (NEURONTIN ) 300 MG capsule Take 1 capsule (300 mg total) by mouth 3 (three) times daily. 11/06/23   Haviland, Julie, MD  isosorbide  mononitrate (IMDUR ) 120 MG 24 hr tablet Take 1 tablet (120 mg total) by mouth daily. 11/06/23   Dean Clarity, MD  lidocaine  (LIDODERM ) 5 % Place 1 patch onto the skin daily. Remove & Discard patch within 12 hours or as directed by MD 09/18/23   Armenta Canning, MD  losartan   (COZAAR ) 50 MG tablet Take 1 tablet (50 mg total) by mouth daily. 11/06/23   Dean Clarity, MD  methocarbamol  (ROBAXIN ) 500 MG tablet Take 1 tablet (500 mg total) by mouth 4 (four) times daily as needed for muscle spasms. 09/16/23   Kammerer, Megan L, DO  nitroGLYCERIN  (NITROSTAT ) 0.4 MG SL tablet PLACE 1 TABLET UNDER THE TONGUE EVERY 5 MINUTES AS NEEDED FOR CHEST PAIN. 10/28/23   Madireddy, Alean SAUNDERS, MD  ranolazine  (RANEXA ) 1000 MG SR tablet Take 1 tablet (1,000 mg total) by mouth 2 (two) times  daily. 11/06/23   Dean Clarity, MD  rivaroxaban  (XARELTO ) 20 MG TABS tablet Take 1 tablet (20 mg total) by mouth daily with supper. 11/06/23   Dean Clarity, MD    Current Facility-Administered Medications  Medication Dose Route Frequency Provider Last Rate Last Admin   acetaminophen  (TYLENOL ) tablet 650 mg  650 mg Oral Q6H PRN Elicia Sharper, DO       Or   acetaminophen  (TYLENOL ) suppository 650 mg  650 mg Rectal Q6H PRN Zheng, Michael, DO       atorvastatin  (LIPITOR ) tablet 80 mg  80 mg Oral Daily Zheng, Michael, DO   80 mg at 11/18/23 9177   diclofenac  Sodium (VOLTAREN ) 1 % topical gel 2 g  2 g Topical QID PRN [name], [name], DO       gabapentin  (NEURONTIN ) capsule 300 mg  300 mg Oral TID Zheng, Michael, DO   300 mg at 11/18/23 9177   lidocaine  (LIDODERM ) 5 % 1 patch  1 patch Transdermal Q24H Zheng, Michael, DO       nicotine  (NICODERM CQ  - dosed in mg/24 hours) patch 14 mg  14 mg Transdermal Daily Zheng, Michael, DO   14 mg at 11/18/23 9177   pantoprazole  (PROTONIX ) injection 40 mg  40 mg Intravenous Q12H Zheng, Michael, DO   40 mg at 11/18/23 9176   prochlorperazine  (COMPAZINE ) injection 10 mg  10 mg Intravenous Q6H PRN Rihner, Emilie, DO       ranolazine  (RANEXA ) 12 hr tablet 1,000 mg  1,000 mg Oral BID Zheng, Michael, DO   1,000 mg at 11/18/23 9177   senna-docusate (Senokot-S) tablet 1 tablet  1 tablet Oral QHS PRN Zheng, Michael, DO        Allergies as of 11/18/2023   (No Known Allergies)     Social History   Socioeconomic History   Marital status: Married    Spouse name: Not on file   Number of children: Not on file   Years of education: Not on file   Highest education level: Not on file  Occupational History   Not on file  Tobacco Use   Smoking status: Every Day    Types: Cigars, Cigarettes    Passive exposure: Current (Wife is a smoker)   Smokeless tobacco: Never   Tobacco comments:    as of 12/20/19 2 cigararettes/day  Vaping Use   Vaping status: Never Used  Substance and Sexual Activity   Alcohol use: No   Drug use: Yes    Types: Marijuana, Cocaine   Sexual activity: Not Currently  Other Topics Concern   Not on file  Social History Narrative   Lives with alone but has a caregiver   Social Drivers of Corporate investment banker Strain: High Risk (06/09/2023)   Overall Financial Resource Strain (CARDIA)    Difficulty of Paying Living Expenses: Very hard  Food Insecurity: Food Insecurity Present (11/18/2023)   Hunger Vital Sign    Worried About Radiation protection practitioner of Food in the Last Year: Sometimes true    Ran Out of Food in the Last Year: Sometimes true  Transportation Needs: Unmet Transportation Needs (11/18/2023)   PRAPARE - Administrator, Civil Service (Medical): Yes    Lack of Transportation (Non-Medical): No  Physical Activity: Inactive (03/25/2023)   Exercise Vital Sign    Days of Exercise per Week: 0 days    Minutes of Exercise per Session: 0 min  Stress: Stress Concern Present (03/25/2023)   [employer] of Occupational  Health - Occupational Stress Questionnaire    Feeling of Stress : Rather much  Social Connections: Socially Integrated (06/16/2023)   Social Connection and Isolation Panel    Frequency of Communication with Friends and Family: More than three times a week    Frequency of Social Gatherings with Friends and Family: Never    Attends Religious Services: More than 4 times per year    Active Member of [employer] or  Organizations: Yes    Attends Banker Meetings: Never    Marital Status: Married  Catering manager Violence: Not At Risk (11/18/2023)   Humiliation, Afraid, Rape, and Kick questionnaire    Fear of Current or Ex-Partner: No    Emotionally Abused: No    Physically Abused: No    Sexually Abused: No     Code Status   Code Status: Full Code  Review of Systems: All systems reviewed and negative except where noted in HPI.  Physical Exam: Vital signs in last 24 hours: Temp:  [98.1 F (36.7 C)-98.8 F (37.1 C)] 98.2 F (36.8 C) (09/19 0740) Pulse Rate:  [68-101] 76 (09/19 0740) Resp:  [10-18] 18 (09/19 0740) BP: (108-159)/(76-108) 121/88 (09/19 0740) SpO2:  [91 %-100 %] 100 % (09/19 0740) Weight:  [88.5 kg] 88.5 kg (09/19 0128) Last BM Date : 11/17/23  General:  Pleasant male in NAD Psych:  Cooperative. Normal mood and affect Eyes: Pupils equal Ears:  Normal auditory acuity Nose: No deformity, discharge or lesions Neck:  Supple, no masses felt Lungs:  Clear to auscultation.  Heart:  Regular rate, regular rhythm.  Abdomen:  Soft, nondistended, nontender, active bowel sounds, no masses felt Rectal :  Deferred Msk: Symmetrical without gross deformities.  Neurologic:  Alert, oriented, grossly normal neurologically Extremities : No edema Skin:  Intact without significant lesions.    Intake/Output from previous day: 09/18 0701 - 09/19 0700 In: 650 [I.V.:650] Out: -  Intake/Output this shift:  Total I/O In: -  Out: 350 [Urine:350]   Vina Dasen, NP-C   11/18/2023, 10:23 AM  -----------------------------------------------------------------  I have taken a history, reviewed the chart and examined the patient. I performed a substantive portion of this encounter, including complete performance of at least one of the key components, in conjunction with the APP. I agree with the APP's note, impression and recommendations  58 year old male with history of CAD  status post CABG, CHF, history of CVA, CKD 3, peripheral artery disease status post femoropopliteal bypass on Xarelto  (last dose morning of 9/18) admitted with symptomatic anemia without overt GI blood loss, but with positive fecal occult blood test.  Hemoglobin 8.7 from a baseline of fifteen 1 week ago.  No prior endoscopic evaluation.  Patient reports chronic irregular bowel habits/loose stools and crampy abdominal pain, but no recent changes in GI symptoms or stool appearance.  Given absence of overt GI bleeding, will plan to proceed with upper and lower endoscopy tomorrow.  Patient has significant coronary artery disease, with cath in April showing severe disease not amenable to intervention, medical therapy recommended.  He denies any symptoms of chest pain/pressure, but he did have these symptoms a few weeks ago and had troponin elevation.  Troponins normal on admission today.  Patient is high risk for sedation given his underlying comorbidities, but given his significant drop in hemoglobin and no active concerning cardiac symptoms, benefits of procedure outweigh the risk.  If no bleeding source identified, would recommend capsule endoscopy next.  Anes Rigel E. Stacia, MD [facility]  High  complex medical decision making (this includes chart review, review of results, face-to-face time used for counseling as well as treatment plan and follow-up. The patient was provided an opportunity to ask questions and all were answered. The patient agreed with the plan and demonstrated an understanding of the instructions
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
## 2023-11-18 NOTE — ED Triage Notes (Signed)
 Pt coming in via EMS with chest pain x 3 days and has reported high troponin. Was seen earlier today but left before coming to the back. Also with increased weakness. Had nitroglycerin  today but no relief.  Fell yesterday, hit his head, after syncope. Reports takes xaerelto.

## 2023-11-18 NOTE — ED Notes (Signed)
 Attempted to call 5C [phone] 3 times and no answer. ED charge aware and states to take PT up.

## 2023-11-19 ENCOUNTER — Encounter (HOSPITAL_COMMUNITY): Payer: Self-pay | Admitting: Infectious Diseases

## 2023-11-19 ENCOUNTER — Observation Stay (HOSPITAL_BASED_OUTPATIENT_CLINIC_OR_DEPARTMENT_OTHER): Admitting: Anesthesiology

## 2023-11-19 ENCOUNTER — Observation Stay (HOSPITAL_COMMUNITY): Admitting: Anesthesiology

## 2023-11-19 ENCOUNTER — Other Ambulatory Visit (HOSPITAL_COMMUNITY): Payer: Self-pay

## 2023-11-19 ENCOUNTER — Encounter (HOSPITAL_COMMUNITY)
Admission: EM | Disposition: A | Discharge status: Home / Self Care | Payer: Self-pay | Source: Home / Self Care | Attending: Emergency Medicine

## 2023-11-19 DIAGNOSIS — K2950 Unspecified chronic gastritis without bleeding: Secondary | ICD-10-CM | POA: Diagnosis not present

## 2023-11-19 DIAGNOSIS — K922 Gastrointestinal hemorrhage, unspecified: Secondary | ICD-10-CM | POA: Diagnosis not present

## 2023-11-19 DIAGNOSIS — D175 Benign lipomatous neoplasm of intra-abdominal organs: Secondary | ICD-10-CM | POA: Diagnosis not present

## 2023-11-19 DIAGNOSIS — R195 Other fecal abnormalities: Secondary | ICD-10-CM

## 2023-11-19 DIAGNOSIS — K5730 Diverticulosis of large intestine without perforation or abscess without bleeding: Secondary | ICD-10-CM

## 2023-11-19 DIAGNOSIS — K3189 Other diseases of stomach and duodenum: Secondary | ICD-10-CM | POA: Diagnosis not present

## 2023-11-19 DIAGNOSIS — K449 Diaphragmatic hernia without obstruction or gangrene: Secondary | ICD-10-CM

## 2023-11-19 DIAGNOSIS — I509 Heart failure, unspecified: Secondary | ICD-10-CM

## 2023-11-19 DIAGNOSIS — I130 Hypertensive heart and chronic kidney disease with heart failure and stage 1 through stage 4 chronic kidney disease, or unspecified chronic kidney disease: Secondary | ICD-10-CM | POA: Diagnosis not present

## 2023-11-19 DIAGNOSIS — I2510 Atherosclerotic heart disease of native coronary artery without angina pectoris: Secondary | ICD-10-CM | POA: Diagnosis not present

## 2023-11-19 DIAGNOSIS — I739 Peripheral vascular disease, unspecified: Secondary | ICD-10-CM

## 2023-11-19 DIAGNOSIS — D500 Iron deficiency anemia secondary to blood loss (chronic): Secondary | ICD-10-CM | POA: Diagnosis not present

## 2023-11-19 DIAGNOSIS — D62 Acute posthemorrhagic anemia: Secondary | ICD-10-CM | POA: Diagnosis not present

## 2023-11-19 DIAGNOSIS — D124 Benign neoplasm of descending colon: Secondary | ICD-10-CM | POA: Diagnosis not present

## 2023-11-19 DIAGNOSIS — Z7901 Long term (current) use of anticoagulants: Secondary | ICD-10-CM

## 2023-11-19 DIAGNOSIS — D649 Anemia, unspecified: Secondary | ICD-10-CM | POA: Diagnosis not present

## 2023-11-19 HISTORY — PX: COLONOSCOPY: SHX5424

## 2023-11-19 HISTORY — PX: ESOPHAGOGASTRODUODENOSCOPY: SHX5428

## 2023-11-19 LAB — BASIC METABOLIC PANEL WITH GFR
Anion gap: 9 (ref 5–15)
BUN: 7 mg/dL (ref 6–20)
CO2: 24 mmol/L (ref 22–32)
Calcium: 9.5 mg/dL (ref 8.9–10.3)
Chloride: 105 mmol/L (ref 98–111)
Creatinine, Ser: 1.27 mg/dL — ABNORMAL HIGH (ref 0.61–1.24)
GFR, Estimated: >60 mL/min (ref >60)
Glucose, Bld: 96 mg/dL (ref 70–99)
Potassium: 4 mmol/L (ref 3.5–5.1)
Sodium: 138 mmol/L (ref 135–145)

## 2023-11-19 LAB — CBC
HCT: 31 % — ABNORMAL LOW (ref 39.0–52.0)
Hemoglobin: 10.2 g/dL — ABNORMAL LOW (ref 13.0–17.0)
MCH: 31.3 pg (ref 26.0–34.0)
MCHC: 32.9 g/dL (ref 30.0–36.0)
MCV: 95.1 fL (ref 80.0–100.0)
Platelets: 354 K/uL (ref 150–400)
RBC: 3.26 MIL/uL — ABNORMAL LOW (ref 4.22–5.81)
RDW: 15.7 % — ABNORMAL HIGH (ref 11.5–15.5)
WBC: 8.4 K/uL (ref 4.0–10.5)
nRBC: 0 % (ref 0.0–0.2)

## 2023-11-19 SURGERY — COLONOSCOPY
Anesthesia: Monitor Anesthesia Care

## 2023-11-19 MED ORDER — PHENYLEPHRINE HCL (PRESSORS) 10 MG/ML IV SOLN
INTRAVENOUS | Status: DC | PRN
Start: 2023-11-19 — End: 2023-11-19
  Administered 2023-11-19: 100 ug via INTRAVENOUS
  Administered 2023-11-19: 80 ug via INTRAVENOUS
  Administered 2023-11-19: 100 ug via INTRAVENOUS

## 2023-11-19 MED ORDER — NICOTINE 14 MG/24HR TD PT24
14.0000 mg | MEDICATED_PATCH | Freq: Every day | TRANSDERMAL | 0 refills | Status: DC
  Filled 2023-11-19: qty 28, 28d supply, fill #0

## 2023-11-19 MED ORDER — SODIUM CHLORIDE 0.9 % IV SOLN: INTRAVENOUS | Status: DC | PRN

## 2023-11-19 MED ORDER — PANTOPRAZOLE SODIUM 40 MG PO TBEC
40.0000 mg | DELAYED_RELEASE_TABLET | Freq: Two times a day (BID) | ORAL | 0 refills | Status: DC
  Filled 2023-11-19: qty 60, 30d supply, fill #0

## 2023-11-19 MED ORDER — PROPOFOL 500 MG/50ML IV EMUL
INTRAVENOUS | Status: DC | PRN
  Administered 2023-11-19: 120 ug/kg/min via INTRAVENOUS

## 2023-11-19 NOTE — Care Management Obs Status (Signed)
 MEDICARE OBSERVATION STATUS NOTIFICATION   Patient Details  Name: Benjamin Espinoza MRN: [id number] Date of Birth: Jan 23, 1966   Medicare Observation Status Notification Given:  Yes    Jon Cruel 11/19/2023, 12:16 PM

## 2023-11-19 NOTE — Interval H&P Note (Signed)
 History and Physical Interval Note:  11/19/2023 8:35 AM  Benjamin Espinoza  has presented today for surgery, with the diagnosis of Evaluation of anemia and hemoccult positive stools.  The various methods of treatment have been discussed with the patient and family. After consideration of risks, benefits and other options for treatment, the patient has consented to  Procedure(s): COLONOSCOPY (N/A) EGD (ESOPHAGOGASTRODUODENOSCOPY) (N/A) as a surgical intervention.  The patient's history has been reviewed, patient examined, no change in status, stable for surgery.  I have reviewed the patient's chart and labs.  Questions were answered to the patient's satisfaction.     [name]

## 2023-11-19 NOTE — Plan of Care (Signed)
   Problem: Education: Goal: Knowledge of General Education information will improve Description Including pain rating scale, medication(s)/side effects and non-pharmacologic comfort measures Outcome: Progressing   Problem: Health Behavior/Discharge Planning: Goal: Ability to manage health-related needs will improve Outcome: Progressing

## 2023-11-19 NOTE — Anesthesia Postprocedure Evaluation (Signed)
 Anesthesia Post Note  Patient: Benjamin Espinoza  Procedure(s) Performed: COLONOSCOPY EGD (ESOPHAGOGASTRODUODENOSCOPY)     Patient location during evaluation: PACU Anesthesia Type: MAC Level of consciousness: awake and alert Pain management: pain level controlled Vital Signs Assessment: post-procedure vital signs reviewed and stable Respiratory status: spontaneous breathing, nonlabored ventilation, respiratory function stable and patient connected to nasal cannula oxygen Cardiovascular status: stable and blood pressure returned to baseline Postop Assessment: no apparent nausea or vomiting Anesthetic complications: no   There were no known notable events for this encounter.  Last Vitals:  Vitals:   11/19/23 0940 11/19/23 0950  BP: 134/67 122/86  Pulse: 70 82  Resp: 18 (!) 24  Temp:    SpO2: 99% 99%    Last Pain:  Vitals:   11/19/23 1046  TempSrc:   PainSc: 0-No pain                 [name]

## 2023-11-19 NOTE — Op Note (Signed)
 [facility] Patient Name: Benjamin Espinoza Procedure Date : 11/19/2023 MRN: [id number] Attending MD: Glendia BRAVO. Stacia , MD, [id number] Date of Birth: 09/02/1965 CSN: [id number] Age: 58 Admit Type: Inpatient Procedure:                Upper GI endoscopy Indications:              Acute post hemorrhagic anemia (4 point drop in hgb                            without overt bleeding), Heme positive stool Providers:                Glendia E. Stacia, MD, [place],                            Technician, Robie Breed, RN Referring MD:              Medicines:                Monitored Anesthesia Care Complications:            No immediate complications. Estimated Blood Loss:     Estimated blood loss was minimal. Procedure:                Pre-Anesthesia Assessment:                           - Prior to the procedure, a History and Physical                            was performed, and patient medications and                            allergies were reviewed. The patient's tolerance of                            previous anesthesia was also reviewed. The risks                            and benefits of the procedure and the sedation                            options and risks were discussed with the patient.                            All questions were answered, and informed consent                            was obtained. Prior Anticoagulants: The patient has                            taken Xarelto  (rivaroxaban ), last dose was 2 days                            prior to procedure. ASA Grade Assessment: III - A  patient with severe systemic disease. After                            reviewing the risks and benefits, the patient was                            deemed in satisfactory condition to undergo the                            procedure.                           After obtaining informed consent, the endoscope was                             passed under direct vision. Throughout the                            procedure, the patient's blood pressure, pulse, and                            oxygen saturations were monitored continuously. The                            [id number]-H190 ([id number]) Olympus endoscope was introduced                            through the mouth, and advanced to the third part                            of duodenum. The upper GI endoscopy was                            accomplished without difficulty. The patient                            tolerated the procedure well. Scope In: Scope Out: Findings:      The examined esophagus was normal.      A 5 cm hiatal hernia was present.      Multiple dispersed diminutive erosions with no bleeding and no stigmata       of recent bleeding were found in the gastric antrum. Biopsies were taken       with a cold forceps for Helicobacter pylori testing. Estimated blood       loss was minimal.      The exam of the stomach was otherwise normal.      The examined duodenum was normal. Impression:               - Normal esophagus.                           - 5 cm hiatal hernia. No Cameron's erosions/ulcers                            seen.                           -  Erosive gastropathy with no bleeding and no                            stigmata of recent bleeding. Biopsied.                           - Normal examined duodenum.                           - Mild erosive gastropathy seems unlikely to have                            been a source of GI bleeding, but possible. Moderate Sedation:      N/A Recommendation:           - Return patient to hospital ward for ongoing care.                           - Clear liquid diet.                           - Resume Xarelto  (rivaroxaban ) at prior dose                            tomorrow.                           - Await pathology results.                           - Proceed with colonoscopy                           -  Continue PPI BID Procedure Code(s):        --- Professional ---                           [phone], Esophagogastroduodenoscopy, flexible,                            transoral; with biopsy, single or multiple Diagnosis Code(s):        --- Professional ---                           K44.9, Diaphragmatic hernia without obstruction or                            gangrene                           K31.89, Other diseases of stomach and duodenum                           D62, Acute posthemorrhagic anemia                           R19.5, Other fecal abnormalities CPT copyright 2022 American Medical Association. All rights reserved. The codes documented in  this report are preliminary and upon coder review may  be revised to meet current compliance requirements. Luberta Grabinski E. Stacia, MD 11/19/2023 9:46:36 AM This report has been signed electronically. Number of Addenda: 0

## 2023-11-19 NOTE — Discharge Instructions (Addendum)
 You came into the hospital because you were having some lightheadedness and dizziness. We noticed that you hemoglobin which is what we use to measure your blood levels, had dropped low and did a test that found that you had some blood in your stool. We talked to the stomach doctors and they wanted to take a look at your intestines with a camera. They did not find any signs of bleeding when they took a look but they did find some signs of inflammation in your stomach. Due to this we are sending you home on a medication called protonix  to help protect the lining of your stomach. Please take this medication twice a day. You will follow up with the stomach doctors and they will check your blood levels and likely do some more procedures to check for any other signs of bleeding  Thank you for allowing us  to be part of your care.   Please call the Internal Medicine Clinic at the number provided to arrange an appointment. We will be your primary care doctors Address for [facility] is [address]  [address], [place], [place] [address]  Phone number is: [phone]  Please also call the GI clinic at the number provided. You need to follow up with them within the next 7 days.  I would also suggest you follow up with your cardiologist to discuss your medications for chest pain   Please note these changes made to your medications:  *Please START taking:  Protonix  40 mg twice a day  Aspirin  81 mg once a day   *Please STOP taking:  -Cilostazol  until you see your cardiologist   *Please CONTINUE taking:  -atorvastatin  80 mg - carvedilol  6.25 mg twice a day -losartan  50 mg  daily -Imdur  120 mg once a day -ranolozine 1000 mg twice a day -Nitroglycerin  as needed for chest pain  - Gabapentin  300 mg 3 times daily as needed -xarelto  20 mg( start tomorrow)   Please call our clinic if you have any questions or concerns, we may be able to help and keep you from a long and expensive emergency room wait. Our clinic and after  hours phone number is [phone], the best time to call is Monday through Friday 9 am to 4 pm but there is always someone available 24/7 if you have an emergency. If you need medication refills please notify your pharmacy one week in advance and they will send us  a request.

## 2023-11-19 NOTE — Anesthesia Preprocedure Evaluation (Addendum)
 Anesthesia Evaluation  Patient identified by MRN, date of birth, ID band Patient awake    Reviewed: Allergy & Precautions, NPO status , Patient's Chart, lab work & pertinent test results  Airway Mallampati: II  TM Distance: >3 FB Neck ROM: Full    Dental no notable dental hx.    Pulmonary asthma , Current Smoker and Patient abstained from smoking.   Pulmonary exam normal        Cardiovascular hypertension, Pt. on medications and Pt. on home beta blockers + CAD, + Past MI, + CABG (2017) and + Peripheral Vascular Disease   Rhythm:Regular Rate:Normal  ECHO:  1. Hypokinesis of basal septuml unchanged from previous Echo. Left ventricular ejection fraction, by estimation, is 50 to 55%. The left ventricle has low normal function. There is mild left ventricular hypertrophy. Left ventricular diastolic parameters were normal.  2. Right ventricular systolic function is normal. The right ventricular size is normal.  3. The mitral valve is normal in structure. Mild mitral valve regurgitation.  4. The aortic valve is tricuspid. Aortic valve regurgitation is mild. Aortic valve sclerosis/calcification is present, without any evidence of aortic stenosis.  5. Compared to previous echo, root is slightly bigger. . Aortic dilatation noted. There is moderate dilatation of the aortic root, measuring 46 mm. There is mild dilatation of the ascending aorta, measuring 42 mm.    Neuro/Psych   Anxiety     CVA    GI/Hepatic Neg liver ROS,,,  Endo/Other  negative endocrine ROS    Renal/GU   negative genitourinary   Musculoskeletal negative musculoskeletal ROS (+)    Abdominal Normal abdominal exam  (+)   Peds  Hematology  (+) Blood dyscrasia, anemia Lab Results      Component                Value               Date                      WBC                      7.9                 11/18/2023                HGB                      8.8 (L)              11/18/2023                HCT                      27.6 (L)            11/18/2023                MCV                      98.6                11/18/2023                PLT                      263  11/18/2023             Lab Results      Component                Value               Date                      NA                       139                 11/18/2023                K                        3.6                 11/18/2023                CO2                      22                  11/18/2023                GLUCOSE                  97                  11/18/2023                BUN                      14                  11/18/2023                CREATININE               1.27 (H)            11/18/2023                CALCIUM                   8.8 (L)             11/18/2023                EGFR                     68                  04/09/2021                GFRNONAA                 >60                 11/18/2023              Anesthesia Other Findings   Reproductive/Obstetrics                              Anesthesia Physical Anesthesia Plan  ASA: 3  Anesthesia Plan: MAC   Post-op Pain Management:    Induction:  Intravenous  PONV Risk Score and Plan: Propofol  infusion and Treatment may vary due to age or medical condition  Airway Management Planned: Simple Face Mask and Nasal Cannula  Additional Equipment: None  Intra-op Plan:   Post-operative Plan:   Informed Consent: I have reviewed the patients History and Physical, chart, labs and discussed the procedure including the risks, benefits and alternatives for the proposed anesthesia with the patient or authorized representative who has indicated his/her understanding and acceptance.     Dental advisory given  Plan Discussed with: CRNA  Anesthesia Plan Comments:          Anesthesia Quick Evaluation

## 2023-11-19 NOTE — Transfer of Care (Signed)
 Immediate Anesthesia Transfer of Care Note  Patient: Benjamin Espinoza  Procedure(s) Performed: COLONOSCOPY EGD (ESOPHAGOGASTRODUODENOSCOPY)  Patient Location: PACU and Endoscopy Unit  Anesthesia Type:MAC  Level of Consciousness: awake and alert   Airway & Oxygen Therapy: Patient Spontanous Breathing and Patient connected to nasal cannula oxygen  Post-op Assessment: Report given to RN and Post -op Vital signs reviewed and stable  Post vital signs: Reviewed and stable  Last Vitals:  Vitals Value Taken Time  BP 111/74 11/19/23 09:33  Temp 36.7 C 11/19/23 09:33  Pulse 76 11/19/23 09:33  Resp 19 11/19/23 09:34  SpO2 100 % 11/19/23 09:33  Vitals shown include unfiled device data.  Last Pain:  Vitals:   11/19/23 0933  TempSrc: Temporal  PainSc: Asleep      Patients Stated Pain Goal: 0 (11/18/23 2223)  Complications: There were no known notable events for this encounter.
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
## 2023-11-19 NOTE — Op Note (Signed)
 [facility] Patient Name: Benjamin Espinoza Procedure Date : 11/19/2023 MRN: [id number] Attending MD: Glendia BRAVO. Stacia , MD, [id number] Date of Birth: [birth year] CSN: [id number] Age: 58 Admit Type: Inpatient Procedure:                Colonoscopy Indications:              Heme positive stool, Acute post hemorrhagic anemia Providers:                Glendia E. Stacia, MD, [place],                            Technician, Robie Breed, RN Referring MD:              Medicines:                Monitored Anesthesia Care Complications:            No immediate complications. Estimated Blood Loss:     Estimated blood loss was minimal. Procedure:                Pre-Anesthesia Assessment:                           - Prior to the procedure, a History and Physical                            was performed, and patient medications and                            allergies were reviewed. The patient's tolerance of                            previous anesthesia was also reviewed. The risks                            and benefits of the procedure and the sedation                            options and risks were discussed with the patient.                            All questions were answered, and informed consent                            was obtained. Prior Anticoagulants: The patient has                            taken Xarelto  (rivaroxaban ), last dose was 2 days                            prior to procedure. ASA Grade Assessment: III - A                            patient with severe systemic disease. After  reviewing the risks and benefits, the patient was                            deemed in satisfactory condition to undergo the                            procedure.                           - Prior to the procedure, a History and Physical                            was performed, and patient medications and                             allergies were reviewed. The patient's tolerance of                            previous anesthesia was also reviewed. The risks                            and benefits of the procedure and the sedation                            options and risks were discussed with the patient.                            All questions were answered, and informed consent                            was obtained. Prior Anticoagulants: The patient has                            taken Xarelto  (rivaroxaban ), last dose was 2 days                            prior to procedure. ASA Grade Assessment: III - A                            patient with severe systemic disease. After                            reviewing the risks and benefits, the patient was                            deemed in satisfactory condition to undergo the                            procedure.                           After obtaining informed consent, the colonoscope  was passed under direct vision. Throughout the                            procedure, the patient's blood pressure, pulse, and                            oxygen saturations were monitored continuously. The                            CF-HQ190L ([id number] Olympus colonoscope was                            introduced through the anus and advanced to the the                            terminal ileum, with identification of the                            appendiceal orifice and IC valve. The colonoscopy                            was performed without difficulty. The patient                            tolerated the procedure well. The quality of the                            bowel preparation was adequate. The terminal ileum,                            ileocecal valve, appendiceal orifice, and rectum                            were photographed. The bowel preparation used was                            SUPREP via split dose instruction. Scope In: 9:01:23  AM Scope Out: 9:25:42 AM Scope Withdrawal Time: 0 hours 20 minutes 24 seconds  Total Procedure Duration: 0 hours 24 minutes 19 seconds  Findings:      The perianal and digital rectal examinations were normal. Pertinent       negatives include normal sphincter tone and no palpable rectal lesions.      A 3 mm polyp was found in the descending colon. The polyp was sessile.       The polyp was removed with a cold snare. Resection and retrieval were       complete. Estimated blood loss was minimal.      There was a medium-sized lipoma, 15 mm in diameter, at the hepatic       flexure. Pillow sign positive. Biopsies were taken with a cold forceps       for histology to confirm. Estimated blood loss was minimal.      A few small-mouthed diverticula were found in the descending colon and       transverse colon.      The exam was  otherwise normal throughout the examined colon.      The terminal ileum appeared normal.      The retroflexed view of the distal rectum and anal verge was normal and       showed no anal or rectal abnormalities. Impression:               - One 3 mm polyp in the descending colon, removed                            with a cold snare. Resected and retrieved.                           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                            Biopsied.                           - Mild diverticulosis in the descending colon and                            in the transverse colon.                           - The examined portion of the ileum was normal.                           - The distal rectum and anal verge are normal on                            retroflexion view.                           - No endoscopic findings to explain                            anemia/hemoccult positive stool. Moderate Sedation:      N/A Recommendation:           - Return patient to hospital ward for ongoing care.                           - Clear liquid diet, pending decision on  capsule                            endosocpy.                           - Resume Xarelto  (rivaroxaban ) at prior dose                            tomorrow.                           - Await pathology results.                           -  Repeat colonoscopy for surveillance based on                            pathology results.                           - Recommend video capsule endoscopy. Given stable                            hemoglobin and absence of overt GI bleeding, this                            can be completed as outpatient, if patient desires. Procedure Code(s):        --- Professional ---                           [phone], Colonoscopy, flexible; with removal of                            tumor(s), polyp(s), or other lesion(s) by snare                            technique                           45380, 59, Colonoscopy, flexible; with biopsy,                            single or multiple Diagnosis Code(s):        --- Professional ---                           D12.4, Benign neoplasm of descending colon                           D17.5, Benign lipomatous neoplasm of                            intra-abdominal organs                           R19.5, Other fecal abnormalities                           D62, Acute posthemorrhagic anemia                           K57.30, Diverticulosis of large intestine without                            perforation or abscess without bleeding CPT copyright 2022 American Medical Association. All rights reserved. The codes documented in this report are preliminary and upon coder review may  be revised to meet current compliance requirements. Morganne Haile E. Stacia, MD 11/19/2023 9:53:15 AM This report has been signed electronically. Number of Addenda: 0

## 2023-11-19 NOTE — Plan of Care (Signed)

## 2023-11-21 ENCOUNTER — Telehealth: Payer: Self-pay | Admitting: *Deleted

## 2023-11-21 ENCOUNTER — Encounter (HOSPITAL_COMMUNITY): Payer: Self-pay | Admitting: Gastroenterology

## 2023-11-21 DIAGNOSIS — K922 Gastrointestinal hemorrhage, unspecified: Secondary | ICD-10-CM

## 2023-11-21 NOTE — Telephone Encounter (Signed)
-----   Message from [name] sent at 11/19/2023 11:00 AM EDT ----- Regarding: Outpatient CBC and VCE Team,  Can you please arrange for this patient to have a repeat CBC performed next week (Wed preferably) and set up him up for an outpatient VCE?  Thanks [place]

## 2023-11-21 NOTE — Telephone Encounter (Signed)
 Left message for patient to call back

## 2023-11-21 NOTE — Telephone Encounter (Signed)
 Orders placed for CBC to be completed 11/23/23 at [facility]

## 2023-11-22 LAB — SURGICAL PATHOLOGY

## 2023-11-22 NOTE — Telephone Encounter (Signed)
 I have spoken to patient to ask that he come for labwork at our office tomorrow. He is given the location of our lab and states he will come for this. Additionally, he has scheduled capsule endoscopy for 12/12/23. Prep instructions have been made available via mychart and have also been placed at our 3rd floor front desk for review since patient states he has difficulty accessing his MyChart account. He is to call back with any questions.

## 2023-11-23 ENCOUNTER — Other Ambulatory Visit (INDEPENDENT_AMBULATORY_CARE_PROVIDER_SITE_OTHER)

## 2023-11-23 DIAGNOSIS — K922 Gastrointestinal hemorrhage, unspecified: Secondary | ICD-10-CM

## 2023-11-23 LAB — CBC WITH DIFFERENTIAL/PLATELET
Basophils Absolute: 0.1 K/uL (ref 0.0–0.1)
Basophils Relative: 1.7 % (ref 0.0–3.0)
Eosinophils Absolute: 0.2 K/uL (ref 0.0–0.7)
Eosinophils Relative: 1.9 % (ref 0.0–5.0)
HCT: 31.1 % — ABNORMAL LOW (ref 39.0–52.0)
Hemoglobin: 10.6 g/dL — ABNORMAL LOW (ref 13.0–17.0)
Lymphocytes Relative: 30.6 % (ref 12.0–46.0)
Lymphs Abs: 2.6 K/uL (ref 0.7–4.0)
MCHC: 34 g/dL (ref 30.0–36.0)
MCV: 94 fl (ref 78.0–100.0)
Monocytes Absolute: 0.7 K/uL (ref 0.1–1.0)
Monocytes Relative: 8.7 % (ref 3.0–12.0)
Neutro Abs: 4.8 K/uL (ref 1.4–7.7)
Neutrophils Relative %: 57.1 % (ref 43.0–77.0)
Platelets: 373 K/uL (ref 150.0–400.0)
RBC: 3.3 Mil/uL — ABNORMAL LOW (ref 4.22–5.81)
RDW: 15.7 % — ABNORMAL HIGH (ref 11.5–15.5)
WBC: 8.5 K/uL (ref 4.0–10.5)

## 2023-11-25 ENCOUNTER — Ambulatory Visit: Payer: Self-pay | Admitting: Gastroenterology

## 2023-11-25 NOTE — Progress Notes (Signed)
 Mr. Wion, The biopsies taken from your stomach were notable for mild chronic gastritis (inflammation) which is a common finding, but there was no evidence of Helicobacter pylori infection. This common finding is not likely to explain anemia and there is no specific treatment or further evaluation recommended.  The biopsies of the nodule in your colon showed fat tissue consistent with a lipoma, as suspected.  Lipomas are completely benign fatty growths which have no cancer potential, do not cause symptoms and no follow-up for surveillance is needed.  The polyp which I removed during your colonoscopy was proven to be completely benign but is considered a pre-cancerous polyp that MAY have grown into cancer if it had not been removed.  Studies shows that at least 20% of women over age 41 and 30% of men over age 59 have pre-cancerous polyps.  Based on current nationally recognized surveillance guidelines, I recommend that you have a repeat colonoscopy in 7 years.   For your iron deficiency anemia, please proceed with the video capsule endoscopy as scheduled on October 13.

## 2023-11-25 NOTE — Congregational Nurse Program (Signed)
 Dept: [phone]   Congregational Nurse Program Note  Date of Encounter: 11/24/23  Past Medical History: Past Medical History:  Diagnosis Date   Abnormal stress test    Acute left ankle pain 03/23/2023   Anxiety    Asthma    as a child   Atypical chest pain 05/07/2017   Chest pain  Unstable angina 06/15/2023   Chronic bilateral low back pain with left-sided sciatica 01/11/2023   Chronic health problem 06/15/2023   Chronic pain syndrome 03/29/2023   Cocaine abuse (HCC) 06/16/2023   Coronary artery disease    DDD (degenerative disc disease), lumbar 11/08/2019   Formatting of this note might be different from the original. 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 There is mild dilatation of the ascending aorta, measuring 43 mm. There is mild dilatation of the aortic root, measuring 44 mm.      Encounter for medication monitoring 01/10/2023   Encounter for well adult exam with abnormal findings 04/08/2023   Essential hypertension    Heart failure with mid-range ejection fraction (HFmEF) (HCC) 06/17/2023   06/16/23 transthoracic echocardiogram: Left Ventricle: Left ventricular ejection fraction 45 to 50%. The left ventricle has mildly decreased function. No left ventricular hypertrophy. 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 [place]    Hyperlipidemia    Impaired mobility and ADLs 03/23/2023   Ischemic cardiomyopathy 12/17/2020   Lumbar radiculopathy 07/05/2023   Myocardial infarction [facility]) 2017   NSTEMI (non-ST elevated myocardial infarction) (HCC) 11/28/2020   OAB (overactive bladder) 01/11/2023   PAD (peripheral artery disease) 09/21/2022   PVD (peripheral vascular disease) 09/21/2019   Right leg pain 03/23/2023   Shortness of breath    Sinus bradycardia 11/29/2019   Tobacco use disorder    Unhoused person 06/16/2023   Unstable angina (HCC) 06/15/2023     Encounter Details:  Community Questionnaire - 11/24/23 1700       Questionnaire   Ask client: Do you give verbal consent for me to treat you today? Yes    Student Assistance N/A    Location Patient Served  [facility]    Encounter Setting CN site    Population Status Unknown    Insurance Medicare    Insurance/Financial Assistance Referral N/A    Medication Patient Medications Reviewed    Medical Provider Yes    Screening Referrals Made N/A    Medical Referrals Made [facility]    Medical Appointment Completed N/A    CNP Interventions Advocate/Support;Navigate Healthcare System;Case Management;Counsel;Educate;Spiritual Care    Screenings CN Performed N/A    ED Visit Averted Yes    Life-Saving Intervention Made Yes          Client to RN office. States that he recently had stoke and has been trying to figure a lot out on his own with how to manage his health better but feels stressed and overwhelmed. He states he has difficulty walking distances and it gives him angina and takes a while to recover. He doesn't know what he can do better to improve his health. He has stopped using drugs and drinking any alcohol but still smoking cigars and cigarettes but wants do better. He also recently had symptomatic anemia and attempting to understand how blood count being low affected him being dizzy. RN did extensive education with client on smoking cessation, symptomatic anemia, and stress management.  He recently moved back  to [facility] and doesn't have a PCP or Cardiologist. We will set up appointment next week to get referrals to appropriate resources and RN will advocate for Cardiac rehab for client. Client additionally received new housing voucher and will be afforded even more sustainable housing so he can be more financially secure. RN will continue to follow up with client and help. No further concerns at this time.

## 2023-11-27 ENCOUNTER — Ambulatory Visit: Payer: Self-pay | Admitting: Gastroenterology

## 2023-11-27 NOTE — Progress Notes (Signed)
 Benjamin Espinoza,  Your hemoglobin is stable.  Please plan to complete the capsule endoscopy (pill camera) study as scheduled.

## 2023-12-01 ENCOUNTER — Telehealth: Admitting: Nurse Practitioner

## 2023-12-01 ENCOUNTER — Other Ambulatory Visit: Payer: Self-pay

## 2023-12-01 VITALS — BP 114/76 | HR 85

## 2023-12-01 DIAGNOSIS — L237 Allergic contact dermatitis due to plants, except food: Secondary | ICD-10-CM | POA: Diagnosis not present

## 2023-12-01 DIAGNOSIS — I10 Essential (primary) hypertension: Secondary | ICD-10-CM | POA: Diagnosis not present

## 2023-12-01 DIAGNOSIS — Z720 Tobacco use: Secondary | ICD-10-CM | POA: Diagnosis not present

## 2023-12-01 DIAGNOSIS — I5020 Unspecified systolic (congestive) heart failure: Secondary | ICD-10-CM | POA: Diagnosis not present

## 2023-12-01 MED ORDER — TRIAMCINOLONE ACETONIDE 0.1 % EX CREA: 1.0000 | TOPICAL_CREAM | Freq: Two times a day (BID) | CUTANEOUS | 0 refills | Status: DC

## 2023-12-01 MED ORDER — NICOTINE POLACRILEX 4 MG MT GUM
4.0000 mg | CHEWING_GUM | OROMUCOSAL | 2 refills | Status: AC | PRN
  Filled 2023-12-01: qty 100, 30d supply, fill #0

## 2023-12-01 MED ORDER — LORATADINE 10 MG PO TABS: 10.0000 mg | ORAL_TABLET | Freq: Every day | ORAL | 11 refills | Status: DC

## 2023-12-01 NOTE — Progress Notes (Unsigned)
 [facility]health Visit  [facility] Visit Consent  ISAM UNREIN, you are scheduled for a [facility] visit with a [place] provider today. Just as with appointments in the office, your consent must be obtained to participate. Your consent will be active for this visit and any [facility] visit you may have with one of our providers in the next 365 days. If you have a MyChart account, a copy of this consent can be sent to you electronically.  By engaging in this [facility] visit, you consent to the provision of healthcare and authorize for your insurance to be billed (if applicable) for the services provided during this visit. Depending on your insurance coverage, you may receive a charge related to this service.  I need to obtain your verbal consent now. Are you willing to proceed with your visit today? BRADEY LUZIER has provided verbal consent on 12/01/2023 for a [facility] visit. Lauraine Kitty, FNP  Date: 12/01/2023 3:36 PM  [facility] Visit via Video Note   I, Lauraine Kitty, connected with  Benjamin Espinoza  ([id number], [birth year]) on 12/01/23 at  3:40 PM EDT by a video-enabled telemedicine application and verified that I am speaking with the correct person using two identifiers.  This is an initial visit to discuss the opportunity to become a primary care patient at [facility] The patient understands that if their medical background is complex, their case will be reviewed with the Medical Director, and if [facility] is not the ideal location for their care, our team will help establish the patient with a primary care provider in the area.   If this is determined that this location is not the best option for the patient, in the future if the patient's medical condition changes we can re explore the option of this location serving as their primary care location.  The patient understands that by becoming a primary care patient, this would be the location for their primary care, and if  they chose to leave this location and seek primary care services at another location they will not be able to continue their relationship with this clinic.    Location: Patient: [facility] Visit Location Patient: [facility] visit location: [facility] Provider: Virtual Visit Location Provider: Home Office   History of Present Illness: Benjamin Espinoza is a 58 y.o. who identifies as a male who was assigned male at birth, and is being seen today as a new patient with the [facility]. He has an extensive medical history including HTN, MI, PAD, heart failure, CVA, hx of CABG, tobacco use. He was recently hospitalized and is looking for assistance with ongoing angina and SOB.   Patient was captured by [facility] at the [facility] in [place], he has had multiple ED visits recently   He was previously seen by cardiology last visit was June of this year- was being seen in [place] and would like to be seen in [place] now that he has moved. Though he has been compliant on current regimen he continues to have angina with exertion and does enjoy being active. 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 He has been using topical hydrocortisone  for relief    [facility] intake: Do you have a current Primary Care Provider? No Have you  seen a Primary Care Provider in the past? Yes Do you live in [facility]? No Do you live in the [facility]? 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   Heart failure with mid-range ejection fraction (HFmEF) (HCC) 06/17/2023   Cocaine abuse (HCC) 06/16/2023   Unhoused person 06/16/2023   Chest pain  Unstable angina 06/15/2023   Chronic health problem 06/15/2023   Unstable angina (HCC) 06/15/2023   Encounter for well adult exam with abnormal findings 04/08/2023   Chronic pain syndrome 03/29/2023   Acute left ankle pain 03/23/2023   Impaired mobility and ADLs 03/23/2023   Right leg pain 03/23/2023   Chronic bilateral low back pain with left-sided sciatica 01/11/2023   OAB (overactive bladder) 01/11/2023   Encounter for medication monitoring 01/10/2023   PAD (peripheral artery disease) 09/21/2022   Ischemic cardiomyopathy 12/17/2020   NSTEMI (non-ST elevated myocardial infarction) (HCC) 11/28/2020   Anxiety    Asthma    Atherosclerosis of native coronary artery of native heart without angina pectoris    Sinus bradycardia 11/29/2019   Abnormal stress test    Shortness of breath    DDD (degenerative disc disease), lumbar 11/08/2019   Essential hypertension    Hx of CABG    Hyperlipidemia    Myocardial infarction (HCC)    Tobacco use disorder    PVD (peripheral vascular disease) 09/21/2019   Atypical chest pain 05/07/2017    Allergies: No Known Allergies Medications:  Current Outpatient Medications:    aspirin  EC 81 MG tablet, Take 1 tablet (81 mg total) by mouth daily. (Patient not taking: Reported on 11/18/2023), Disp: 30 tablet, Rfl: 0   atorvastatin  (LIPITOR ) 80 MG tablet, Take 1 tablet (80 mg total) by mouth daily., Disp: 90 tablet, Rfl: 0   carvedilol  (COREG ) 6.25 MG tablet, Take 1 tablet (6.25 mg total) by mouth 2 (two) times daily with a meal., Disp: 180 tablet, Rfl: 0   [Paused] cilostazol  (PLETAL ) 100 MG tablet, Take 1 tablet (100 mg total) by mouth 2 (two) times daily. (Patient  taking differently: Take 100 mg by mouth daily.), Disp: 60 tablet, Rfl: 0   gabapentin  (NEURONTIN ) 300 MG capsule, Take 1 capsule (300 mg total) by mouth 3 (three) times daily. (Patient taking differently: Take 300 mg by mouth 3 (three) times daily as needed (neuropathy, pain).), Disp: 90 capsule, Rfl: 0   isosorbide  mononitrate (IMDUR ) 120 MG 24 hr tablet, Take 1 tablet (120 mg total) by mouth daily. (Patient taking differently: Take 120 mg by mouth 2 (two) times daily.), Disp: 90 tablet, Rfl: 0   losartan  (COZAAR ) 50 MG tablet, Take 1 tablet (50 mg total) by mouth daily., Disp: 30 tablet, Rfl: 0   nicotine  (NICODERM CQ  - DOSED IN MG/24 HOURS) 14 mg/24hr patch, Place 1 patch (14 mg total) onto the skin daily., Disp: 28 patch, Rfl: 0   nitroGLYCERIN  (NITROSTAT ) 0.4 MG SL tablet, PLACE 1 TABLET UNDER THE TONGUE EVERY 5 MINUTES AS NEEDED FOR CHEST PAIN. (Patient not taking: Reported on 11/18/2023), Disp: 25 tablet, Rfl: 1   pantoprazole  (  PROTONIX ) 40 MG tablet, Take 1 tablet (40 mg total) by mouth 2 (two) times daily., Disp: 60 tablet, Rfl: 0   ranolazine  (RANEXA ) 1000 MG SR tablet, Take 1 tablet (1,000 mg total) by mouth 2 (two) times daily. (Patient taking differently: Take 1,000 mg by mouth daily.), Disp: 60 tablet, Rfl: 0   rivaroxaban  (XARELTO ) 20 MG TABS tablet, Take 1 tablet (20 mg total) by mouth daily with supper. (Patient taking differently: Take 20 mg by mouth daily after breakfast.), Disp: 30 tablet, Rfl: 0  Observations/Objective: Physical Exam Constitutional:      General: He is not in acute distress. HENT:     Nose: Nose normal.     Mouth/Throat:     Mouth: Mucous membranes are moist.  Pulmonary:     Effort: Pulmonary effort is normal.  Skin:    Findings: Erythema and rash present.      Neurological:     Mental Status: He is alert and oriented to person, place, and time.  Psychiatric:        Mood and Affect: Mood normal.     Today's Vitals   12/01/23 1549  BP: 114/76   Pulse: 85   There is no height or weight on file to calculate BMI.   Assessment and Plan:   1. Essential hypertension (Primary) Re establish in new location:  - Ambulatory referral to Cardiology  2. Heart failure with mid-range ejection fraction [facility])  - Ambulatory referral to Cardiology  3. Poison ivy  - triamcinolone cream (KENALOG) 0.1 %; Apply 1 Application topically 2 (two) times daily.  Dispense: 30 g; Refill: 0  4. Tobacco abuse  - nicotine  polacrilex (NICORETTE) 4 MG gum; Take 1 each (4 mg total) by mouth as needed for smoking cessation.  Dispense: 100 tablet; Refill: 2   Follow Up Instructions: I discussed the assessment and treatment plan with the patient. The Telepresenter provided patient with a physical copy of my written instructions for review.   The patient was advised to call back or seek an in-person evaluation if the symptoms worsen or if the condition fails to improve as anticipated.    Lauraine Kitty, FNP  **Disclaimer: This note may have been dictated with voice recognition software. Similar sounding words can inadvertently be transcribed and this note may contain transcription errors which may not have been corrected upon publication of note.**

## 2023-12-06 ENCOUNTER — Ambulatory Visit (INDEPENDENT_AMBULATORY_CARE_PROVIDER_SITE_OTHER)

## 2023-12-06 VITALS — BP 142/94 | HR 78 | Temp 98.9°F | Ht 72.0 in | Wt 202.0 lb

## 2023-12-06 DIAGNOSIS — F17210 Nicotine dependence, cigarettes, uncomplicated: Secondary | ICD-10-CM | POA: Diagnosis not present

## 2023-12-06 DIAGNOSIS — I25709 Atherosclerosis of coronary artery bypass graft(s), unspecified, with unspecified angina pectoris: Secondary | ICD-10-CM | POA: Insufficient documentation

## 2023-12-06 DIAGNOSIS — F17200 Nicotine dependence, unspecified, uncomplicated: Secondary | ICD-10-CM

## 2023-12-06 DIAGNOSIS — I25708 Atherosclerosis of coronary artery bypass graft(s), unspecified, with other forms of angina pectoris: Secondary | ICD-10-CM

## 2023-12-06 DIAGNOSIS — Z79899 Other long term (current) drug therapy: Secondary | ICD-10-CM

## 2023-12-06 DIAGNOSIS — G8929 Other chronic pain: Secondary | ICD-10-CM

## 2023-12-06 DIAGNOSIS — M5442 Lumbago with sciatica, left side: Secondary | ICD-10-CM

## 2023-12-06 DIAGNOSIS — Z8249 Family history of ischemic heart disease and other diseases of the circulatory system: Secondary | ICD-10-CM

## 2023-12-06 MED ORDER — LIDOCAINE 5 % EX PTCH: 1.0000 | MEDICATED_PATCH | CUTANEOUS | 0 refills | Status: AC

## 2023-12-06 NOTE — Assessment & Plan Note (Addendum)
 Patient states that he has been having chest pain 4-5 times a day.  Most noticeable when he is exerting himself he is not able to have as much physical activity due to this chest pain.  States that the chest pain has become a dull ache at this point.  Patient has been following with cardiology but moved recently from [place] to [place] and would like to reestablish.  Plan: Referral to cardiology here in [employer] and cardiac rehab Paused cilostazol  at time of discharge from the hospital due to patient having lightheadedness and dizziness.  Will continue to positive at this time until follow-up with cardiology Continue Imdur  120 mg daily Continue ranolazine  at 1000 mg SR 2 times daily

## 2023-12-06 NOTE — Progress Notes (Signed)
 New Patient Office Visit  Subjective    Patient ID: Benjamin Espinoza, male    DOB: 1965-08-18  Age: 58 y.o. MRN: [id number]  CC:  Chief Complaint  Patient presents with   Hospitalization Follow-up    HPI Benjamin Espinoza past medical history CAD status post CABG x 4 and PCI, CVA, hypertension of presents to establish care   Patient was seen in the hospital due to dizziness and lightheadedness that he was having.  He was found to have a hemoglobin dropped down to eights.  Had endoscopy and colonoscopy did not show any signs of bleeding, but did show erosive gastropathy and 1 polyp and a lipoma removed.  Hemoglobin was stable at the time of discharge at 10.2 and patient was sent home on Protonix  40 mg twice daily and is supposed to be following up with GI for capsule endoscopy.   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 Remove & Discard patch within 12 hours or as directed by MD, Disp: 10 patch, Rfl: 0   aspirin  EC 81 MG tablet, Take 1 tablet (81 mg total) by mouth daily. (Patient not taking: Reported on 11/18/2023), Disp: 30 tablet, Rfl: 0   atorvastatin  (LIPITOR ) 80 MG tablet, Take 1 tablet (80 mg total) by mouth daily., Disp: 90 tablet, Rfl: 0   carvedilol  (COREG ) 6.25 MG tablet, Take 1 tablet (6.25 mg total) by mouth 2 (two) times daily with a meal., Disp: 180 tablet, Rfl: 0   [Paused] cilostazol  (PLETAL ) 100 MG tablet, Take 1 tablet (100 mg total) by mouth 2 (two) times daily. (Patient taking differently: Take 100 mg by mouth daily.), Disp: 60 tablet, Rfl: 0   gabapentin  (NEURONTIN ) 300 MG capsule, Take 1 capsule (300 mg total)  by mouth 3 (three) times daily. (Patient taking differently: Take 300 mg by mouth 3 (three) times daily as needed (neuropathy, pain).), Disp: 90 capsule, Rfl: 0   isosorbide  mononitrate (IMDUR ) 120 MG 24 hr tablet, Take 1 tablet (120 mg total) by mouth daily. (Patient taking differently: Take 120 mg by mouth 2 (two) times daily.), Disp: 90 tablet, Rfl: 0   loratadine (CLARITIN) 10 MG tablet, Take 1 tablet (10 mg total) by mouth daily., Disp: 30 tablet, Rfl: 11   losartan  (COZAAR ) 50 MG tablet, Take 1 tablet (50 mg total) by mouth daily., Disp: 30 tablet, Rfl: 0   nicotine  (NICODERM CQ  - DOSED IN MG/24 HOURS) 14 mg/24hr patch, Place 1 patch (14 mg total) onto the skin daily., Disp: 28 patch, Rfl: 0   nicotine  polacrilex (NICORETTE) 4 MG gum, Take 1 each (4 mg total) by mouth as needed for smoking cessation., Disp: 100 tablet, Rfl: 2   nitroGLYCERIN  (NITROSTAT ) 0.4 MG SL tablet, PLACE 1 TABLET UNDER THE TONGUE EVERY 5 MINUTES AS NEEDED FOR CHEST PAIN. (Patient not taking: Reported on 11/18/2023), Disp: 25 tablet, Rfl: 1   pantoprazole  (PROTONIX ) 40 MG tablet, Take 1 tablet (40 mg total) by mouth 2 (two) times daily., Disp: 60 tablet, Rfl: 0   ranolazine  (RANEXA ) 1000 MG SR tablet, Take 1 tablet (1,000 mg total)  by mouth 2 (two) times daily. (Patient taking differently: Take 1,000 mg by mouth daily.), Disp: 60 tablet, Rfl: 0   rivaroxaban  (XARELTO ) 20 MG TABS tablet, Take 1 tablet (20 mg total) by mouth daily with supper. (Patient taking differently: Take 20 mg by mouth daily after breakfast.), Disp: 30 tablet, Rfl: 0   triamcinolone cream (KENALOG) 0.1 %, Apply 1 Application topically 2 (two) times daily., Disp: 30 g, Rfl: 0   Allergies none  FH MI- Mother  and Father  Father- Heart failure Mother- Diabetes, Heart failure  Brother- heart attack died at 28  Sister- CAD  Brother- Throat cancer   SH Lives: home, with landlord  Occupation: No Function: independent in iadls and adls  PCP: none  before  Tobacco use: 7 cigarettes a day, 30 years  Alcohol use: none Illicit drug use: none, previous history of marijuana and cocaine use  Outpatient Encounter Medications as of 12/06/2023  Medication Sig   lidocaine  (LIDODERM ) 5 % Place 1 patch onto the skin daily for 10 days. Remove & Discard patch within 12 hours or as directed by MD   aspirin  EC 81 MG tablet Take 1 tablet (81 mg total) by mouth daily. (Patient not taking: Reported on 11/18/2023)   atorvastatin  (LIPITOR ) 80 MG tablet Take 1 tablet (80 mg total) by mouth daily.   carvedilol  (COREG ) 6.25 MG tablet Take 1 tablet (6.25 mg total) by mouth 2 (two) times daily with a meal.   [Paused] cilostazol  (PLETAL ) 100 MG tablet Take 1 tablet (100 mg total) by mouth 2 (two) times daily. (Patient taking differently: Take 100 mg by mouth daily.)   gabapentin  (NEURONTIN ) 300 MG capsule Take 1 capsule (300 mg total) by mouth 3 (three) times daily. (Patient taking differently: Take 300 mg by mouth 3 (three) times daily as needed (neuropathy, pain).)   isosorbide  mononitrate (IMDUR ) 120 MG 24 hr tablet Take 1 tablet (120 mg total) by mouth daily. (Patient taking differently: Take 120 mg by mouth 2 (two) times daily.)   loratadine (CLARITIN) 10 MG tablet Take 1 tablet (10 mg total) by mouth daily.   losartan  (COZAAR ) 50 MG tablet Take 1 tablet (50 mg total) by mouth daily.   nicotine  (NICODERM CQ  - DOSED IN MG/24 HOURS) 14 mg/24hr patch Place 1 patch (14 mg total) onto the skin daily.   nicotine  polacrilex (NICORETTE) 4 MG gum Take 1 each (4 mg total) by mouth as needed for smoking cessation.   nitroGLYCERIN  (NITROSTAT ) 0.4 MG SL tablet PLACE 1 TABLET UNDER THE TONGUE EVERY 5 MINUTES AS NEEDED FOR CHEST PAIN. (Patient not taking: Reported on 11/18/2023)   pantoprazole  (PROTONIX ) 40 MG tablet Take 1 tablet (40 mg total) by mouth 2 (two) times daily.   ranolazine  (RANEXA ) 1000 MG SR tablet Take 1 tablet (1,000 mg total) by mouth 2 (two) times daily.  (Patient taking differently: Take 1,000 mg by mouth daily.)   rivaroxaban  (XARELTO ) 20 MG TABS tablet Take 1 tablet (20 mg total) by mouth daily with supper. (Patient taking differently: Take 20 mg by mouth daily after breakfast.)   triamcinolone cream (KENALOG) 0.1 % Apply 1 Application topically 2 (two) times daily.   No facility-administered encounter medications on file as of 12/06/2023.    Past Medical History:  Diagnosis Date   Abnormal stress test    Acute left ankle pain 03/23/2023   Anxiety    Asthma    as a child   Atypical chest pain 05/07/2017   Chest pain  Unstable angina 06/15/2023   Chronic bilateral low back pain with left-sided sciatica 01/11/2023   Chronic health problem 06/15/2023   Chronic pain syndrome 03/29/2023   Cocaine abuse (HCC) 06/16/2023   Coronary artery disease    DDD (degenerative disc disease), lumbar 11/08/2019   Formatting of this note might be different from the original. 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 There is mild dilatation of the ascending aorta, measuring 43 mm. There is mild dilatation of the aortic root, measuring 44 mm.      Encounter for medication monitoring 01/10/2023   Encounter for well adult exam with abnormal findings 04/08/2023   Essential hypertension    Heart failure with mid-range ejection fraction (HFmEF) (HCC) 06/17/2023   06/16/23 transthoracic echocardiogram: Left Ventricle: Left ventricular ejection fraction 45 to 50%. The left ventricle has mildly decreased function. No left ventricular hypertrophy. 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 [place]    Hyperlipidemia    Impaired mobility and ADLs 03/23/2023   Ischemic cardiomyopathy 12/17/2020   Lumbar radiculopathy 07/05/2023   Myocardial infarction [facility]) 2017   NSTEMI (non-ST elevated myocardial infarction) (HCC) 11/28/2020   OAB (overactive bladder)  01/11/2023   PAD (peripheral artery disease) 09/21/2022   PVD (peripheral vascular disease) 09/21/2019   Right leg pain 03/23/2023   Shortness of breath    Sinus bradycardia 11/29/2019   Tobacco use disorder    Unhoused person 06/16/2023   Unstable angina (HCC) 06/15/2023    Past Surgical History:  Procedure Laterality Date   ABDOMINAL AORTOGRAM W/LOWER EXTREMITY Bilateral 09/21/2019   Procedure: ABDOMINAL AORTOGRAM W/LOWER EXTREMITY;  Surgeon: Eliza Lonni RAMAN, MD;  Location: [facility] INVASIVE CV LAB;  Service: Cardiovascular;  Laterality: Bilateral;   ABDOMINAL AORTOGRAM W/LOWER EXTREMITY N/A 12/07/2019   Procedure: ABDOMINAL AORTOGRAM W/LOWER EXTREMITY;  Surgeon: Eliza Lonni RAMAN, MD;  Location: [facility] INVASIVE CV LAB;  Service: Cardiovascular;  Laterality: N/A;   ABDOMINAL AORTOGRAM W/LOWER EXTREMITY N/A 09/09/2022   Procedure: ABDOMINAL AORTOGRAM W/LOWER EXTREMITY;  Surgeon: Gretta Lonni PARAS, MD;  Location: [facility] INVASIVE CV LAB;  Service: Vascular;  Laterality: N/A;   CARDIAC CATHETERIZATION     COLONOSCOPY N/A 11/19/2023   Procedure: COLONOSCOPY;  Surgeon: Stacia Glendia BRAVO, MD;  Location: [facility] ENDOSCOPY;  Service: Gastroenterology;  Laterality: N/A;   CORONARY ARTERY BYPASS GRAFT     quadruple bypas 2017   CORONARY STENT INTERVENTION N/A 11/26/2019   Procedure: CORONARY STENT INTERVENTION;  Surgeon: Mady Lonni, MD;  Location: [facility] INVASIVE CV LAB;  Service: Cardiovascular;  Laterality: N/A;   ESOPHAGOGASTRODUODENOSCOPY N/A 11/19/2023   Procedure: EGD (ESOPHAGOGASTRODUODENOSCOPY);  Surgeon: Stacia Glendia BRAVO, MD;  Location: [facility] ENDOSCOPY;  Service: Gastroenterology;  Laterality: N/A;   FEMORAL-POPLITEAL BYPASS GRAFT Right 12/25/2019   Procedure: BYPASS GRAFT FEMORAL- BELOW KNEE POPLITEAL ARTERY RIGHT USING GORE PROPATEN VASCULAR GRAFT;  Surgeon: Eliza Lonni RAMAN, MD;  Location: [facility] OR;  Service: Vascular;  Laterality: Right;   FEMORAL-POPLITEAL BYPASS GRAFT Right  09/21/2022   Procedure: REDO RIGHT LOWER EXTREMITY BYPASS USING GORE PROPATEN 6mm REMOVALBLE RING GRAFT;  Surgeon: Eliza Lonni RAMAN, MD;  Location: [facility] OR;  Service: Vascular;  Laterality: Right;   INTRAOPERATIVE ARTERIOGRAM Right 09/21/2022   Procedure: INTRA OPERATIVE ARTERIOGRAM;  Surgeon: Eliza Lonni RAMAN, MD;  Location: [facility] OR;  Service: Vascular;  Laterality: Right;   LEFT HEART CATH AND CORS/GRAFTS ANGIOGRAPHY N/A 11/26/2019   Procedure: LEFT HEART  CATH AND CORS/GRAFTS ANGIOGRAPHY;  Surgeon: Mady Bruckner, MD;  Location: [facility] INVASIVE CV LAB;  Service: Cardiovascular;  Laterality: N/A;   LEFT HEART CATH AND CORS/GRAFTS ANGIOGRAPHY N/A 12/01/2020   Procedure: LEFT HEART CATH AND CORS/GRAFTS ANGIOGRAPHY;  Surgeon: Burnard Debby LABOR, MD;  Location: [facility] INVASIVE CV LAB;  Service: Cardiovascular;  Laterality: N/A;   LEFT HEART CATH AND CORS/GRAFTS ANGIOGRAPHY N/A 06/16/2023   Procedure: LEFT HEART CATH AND CORS/GRAFTS ANGIOGRAPHY;  Surgeon: Swaziland, Peter M, MD;  Location: [facility] INVASIVE CV LAB;  Service: Cardiovascular;  Laterality: N/A;   LOWER EXTREMITY ANGIOGRAM Right 12/25/2019   Procedure: RIGHT LOWER EXTREMITY ANGIOGRAM;  Surgeon: Eliza Bruckner RAMAN, MD;  Location: [facility] OR;  Service: Vascular;  Laterality: Right;   PERIPHERAL VASCULAR BALLOON ANGIOPLASTY Left 12/07/2019   Procedure: PERIPHERAL VASCULAR BALLOON ANGIOPLASTY;  Surgeon: Eliza Bruckner RAMAN, MD;  Location: [facility] INVASIVE CV LAB;  Service: Cardiovascular;  Laterality: Left;  SFA    Family History  Problem Relation Age of Onset   Heart disease Mother    Heart disease Father    Heart disease Sister     Social History   Socioeconomic History   Marital status: Married    Spouse name: Not on file   Number of children: Not on file   Years of education: Not on file   Highest education level: Not on file  Occupational History   Not on file  Tobacco Use   Smoking status: Every Day    Types: Cigars, Cigarettes    Passive  exposure: Current (Wife is a smoker)   Smokeless tobacco: Never   Tobacco comments:    as of 12/20/19 2 cigararettes/day  Vaping Use   Vaping status: Never Used  Substance and Sexual Activity   Alcohol use: No   Drug use: Yes    Types: Marijuana, Cocaine   Sexual activity: Not Currently  Other Topics Concern   Not on file  Social History Narrative   Lives with alone but has a caregiver   Social Drivers of Corporate investment banker Strain: High Risk (06/09/2023)   Overall Financial Resource Strain (CARDIA)    Difficulty of Paying Living Expenses: Very hard  Food Insecurity: Food Insecurity Present (11/18/2023)   Hunger Vital Sign    Worried About Running Out of Food in the Last Year: Sometimes true    Ran Out of Food in the Last Year: Sometimes true  Transportation Needs: Unmet Transportation Needs (11/18/2023)   PRAPARE - Administrator, Civil Service (Medical): Yes    Lack of Transportation (Non-Medical): No  Physical Activity: Inactive (03/25/2023)   Exercise Vital Sign    Days of Exercise per Week: 0 days    Minutes of Exercise per Session: 0 min  Stress: Stress Concern Present (11/25/2023)   [employer] of Occupational Health - Occupational Stress Questionnaire    Feeling of Stress: To some extent  Social Connections: Socially Integrated (06/16/2023)   Social Connection and Isolation Panel    Frequency of Communication with Friends and Family: More than three times a week    Frequency of Social Gatherings with Friends and Family: Never    Attends Religious Services: More than 4 times per year    Active Member of [employer] or Organizations: Yes    Attends Banker Meetings: Never    Marital Status: Married  Catering manager Violence: Not At Risk (11/18/2023)   Humiliation, Afraid, Rape, and Kick questionnaire    Fear of Current or  Ex-Partner: No    Emotionally Abused: No    Physically Abused: No    Sexually Abused: No    ROS See  problem-based assessment for more details     Objective    BP (!) 142/94 (BP Location: Right Arm, Patient Position: Sitting, Cuff Size: Normal)   Pulse 78   Temp 98.9 F (37.2 C) (Oral)   Ht 6' (1.829 m)   Wt 202 lb (91.6 kg)   SpO2 96%   BMI 27.40 kg/m   Physical Exam Constitution: Alert, in no acute distress Heart: No murmurs heard, regular rate and rhythm Lungs: No respiratory distress, no wheezes heard clear to auscultation MSK: Midline tenderness to the lumbar spine, no step-offs appreciated.  No paraspinal tenderness bilaterally.  Strength testing 5 out of 5 in right lower extremity with plantarflexion and dorsiflexion of both feet bilaterally.  Sensation intact.  4+ out of 5 strength on the left side.  Straight leg raise was positive on the left not the right     Assessment & Plan:   Problem List Items Addressed This Visit     Tobacco use disorder   Patient states that he would like to quit smoking.  Patient states that he has tried nicotine  patches and they have not worked.  Has been smoking about 7 cigarettes a day since he was in his 43s.  Would likely benefit from low-dose CT chest for lung cancer screening and next visit.  Plan: Discuss low-dose CT chest for lung cancer screening at next visit Trial nicotine  gum, if this does not work can trial chantix       Chronic bilateral low back pain with left-sided sciatica   Seems to have documented history of chronic low back pain.  Patient has documented history of degenerative disc disease and also has left-sided sciatica.  Has been trialed on gabapentin  before for sciatica.  Seems he was also given tramadol  in the past.  On physical exam strength is overall intact and patient had positive straight leg raise on left side leading me to think this is sciatica with some degenerative disc disease causing back pain.  Will send patient to physical therapy which he has tried in the past as well.  Plan: Ambulatory referral to  physical therapy Lidocaine  patches and Voltaren  gel      Relevant Orders   Ambulatory referral to Physical Therapy   Coronary artery disease involving coronary bypass graft of native heart with angina pectoris - Primary   Patient states that he has been having chest pain 4-5 times a day.  Most noticeable when he is exerting himself he is not able to have as much physical activity due to this chest pain.  States that the chest pain has become a dull ache at this point.  Patient has been following with cardiology but moved recently from [place] to [place] and would like to reestablish.  Plan: Referral to cardiology here in [facility] and cardiac rehab Paused cilostazol  at time of discharge from the hospital due to patient having lightheadedness and dizziness.  Will continue to positive at this time until follow-up with cardiology Continue Imdur  120 mg daily Continue ranolazine  at 1000 mg SR 2 times daily      Other Visit Diagnoses       Coronary artery disease involving coronary bypass graft of native heart with other forms of angina pectoris       Relevant Orders   Ambulatory referral to Cardiology      Tobacco  Use  Return in about 4 weeks (around 01/03/2024) for FOLLOW UP HTN.  Follow-up about blood pressure Vayden Weinand [name], DO  Patient seen with Dr. Lovie

## 2023-12-06 NOTE — Assessment & Plan Note (Signed)
 Seems to have documented history of chronic low back pain.  Patient has documented history of degenerative disc disease and also has left-sided sciatica.  Has been trialed on gabapentin  before for sciatica.  Seems he was also given tramadol  in the past.  On physical exam strength is overall intact and patient had positive straight leg raise on left side leading me to think this is sciatica with some degenerative disc disease causing back pain.  Will send patient to physical therapy which he has tried in the past as well.  Plan: Ambulatory referral to physical therapy Lidocaine  patches and Voltaren  gel

## 2023-12-06 NOTE — Patient Instructions (Addendum)
[phone] Cardiology office [facility]   Today we discussed the following medical conditions and plan:   For your back pain, you can use some lidocaine  patches and the Voltaren  gel over-the-counter.  I have sent a referral for physical therapy for you.  We will see how that does and we can go from there  Your chest pain, we have referred you to cardiology and cardiac [phone] Cardiology office [facility] is the number for the cardiologist office here  For your smoking, lets try using the nicotine  gum.  When things are more stable maybe we can look at medications to help you quit smoking  Please make sure to follow-up with the stomach doctors as well on 13th.  We look forward to seeing you next time. Please call our clinic at [phone] if you have any questions or concerns. The best time to call is Monday-Friday from 9am-4pm, but there is someone available 24/7. If you need medication refills, please notify your pharmacy one week in advance and they will send us  a request.   Thank you for trusting me with your care. Wishing you the best! I would like to see you back in a month to talk more about your blood pressure and other things  Remonia Romano, DO  [facility]

## 2023-12-06 NOTE — Assessment & Plan Note (Signed)
 Patient states that he would like to quit smoking.  Patient states that he has tried nicotine  patches and they have not worked.  Has been smoking about 7 cigarettes a day since he was in his 64s.  Would likely benefit from low-dose CT chest for lung cancer screening and next visit.  Plan: Discuss low-dose CT chest for lung cancer screening at next visit Trial nicotine  gum, if this does not work can trial chantix

## 2023-12-07 NOTE — Congregational Nurse Program (Signed)
 Dept: [phone]   Congregational Nurse Program Note  Date of Encounter: 12/01/2023  Past Medical History: Past Medical History:  Diagnosis Date   Abnormal stress test    Acute left ankle pain 03/23/2023   Anxiety    Asthma    as a child   Atypical chest pain 05/07/2017   Chest pain  Unstable angina 06/15/2023   Chronic bilateral low back pain with left-sided sciatica 01/11/2023   Chronic health problem 06/15/2023   Chronic pain syndrome 03/29/2023   Cocaine abuse (HCC) 06/16/2023   Coronary artery disease    DDD (degenerative disc disease), lumbar 11/08/2019   Formatting of this note might be different from the original. 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 There is mild dilatation of the ascending aorta, measuring 43 mm. There is mild dilatation of the aortic root, measuring 44 mm.      Encounter for medication monitoring 01/10/2023   Encounter for well adult exam with abnormal findings 04/08/2023   Essential hypertension    Heart failure with mid-range ejection fraction (HFmEF) (HCC) 06/17/2023   06/16/23 transthoracic echocardiogram: Left Ventricle: Left ventricular ejection fraction 45 to 50%. The left ventricle has mildly decreased function. No left ventricular hypertrophy. 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 [place]    Hyperlipidemia    Impaired mobility and ADLs 03/23/2023   Ischemic cardiomyopathy 12/17/2020   Lumbar radiculopathy 07/05/2023   Myocardial infarction [facility]) 2017   NSTEMI (non-ST elevated myocardial infarction) (HCC) 11/28/2020   OAB (overactive bladder) 01/11/2023   PAD (peripheral artery disease) 09/21/2022   PVD (peripheral vascular disease) 09/21/2019   Right leg pain 03/23/2023   Shortness of breath    Sinus bradycardia 11/29/2019   Tobacco use disorder    Unhoused person 06/16/2023   Unstable angina (HCC) 06/15/2023     Encounter Details:  Community Questionnaire - 12/01/23 1409       Questionnaire   Ask client: Do you give verbal consent for me to treat you today? Yes    Student Assistance N/A    Location Patient Served  [facility]    Encounter Setting CN site    Population Status Unknown    Insurance Medicare    Insurance/Financial Assistance Referral N/A    Medication Patient Medications Reviewed    Medical Provider Yes    Screening Referrals Made N/A    Medical Referrals Made [facility] Virtual Visit    Medical Appointment Completed [facility]    CNP Interventions Advocate/Support;Navigate Healthcare System;Case Management;Counsel;Educate;Spiritual Care    Screenings CN Performed Blood Pressure    ED Visit Averted Yes    Life-Saving Intervention Made Yes         BP 114/76 (BP Location: Right Arm, Patient Position: Sitting, Cuff Size: Normal)   Pulse 85    Client to RN office for health education and help getting into PCP and cardiology appointments. He also has a poison ivy rash on forearms. RN able to connect client to [facility] to assist with getting client into cardiac rehab sooner due to angina with minimal exertion. RN able to assist with appointment but also provide extensive health education with client on high blood pressure, symptomatic anemia, and smoking implications. Client is appreciative and will continue to reach out to RN for support needed to manage his health goals. No further issues at this time, RN also gave bus passes to  client to help with medication pick up and PCP appointments over the next week.

## 2023-12-07 NOTE — Progress Notes (Signed)
 Internal Medicine Clinic Attending  I was physically present during the key portions of the resident provided service and participated in the medical decision making of patient's management care. I reviewed pertinent patient test results.  The assessment, diagnosis, and plan were formulated together and I agree with the documentation in the resident's note.  Benjamin Clarity, MD   [phone] man with CAD s/p CABG with chronic angina, recent CVA, and recently admitted with dizziness thought to be from both anemia and angina medications, presents for hospital follow up and to establish care with our clinic.  He has many care gaps, but we focused on his most acute issues today.  First, he continues to have anginal chest pain with minimal exertion. This is chronic and stable, but he is interested in re-establishing with Cardiology (had been seen in [place] before) and doing Cardiac Rehab, which was helpful for him in the past. We placed a new referral today. In the meantime, we'll continue current medicines including aspirin , carvedilol , imdur , and Ranexa , but not including cilostazol , which was paused in the hospital in the setting of dizziness.   For his anemia, he has not noted any further bleeding. He's taking asprin and DOAC. Not dizzy or lightheaded. Unfortunately, the lab closed before he could get CBC done, so we will plan to get that at his close follow-up appointment. He has GI follow up scheduled on 10/13 for capsule endoscopy.   His main concern today is chronic low back pain due to DDD. Will start with PT.

## 2023-12-08 ENCOUNTER — Telehealth: Payer: Self-pay

## 2023-12-08 NOTE — Telephone Encounter (Signed)
 Prior Authorization for patient (Lidocaine  5% patches) came through on cover my meds was submitted with last office notes awaiting approval or denial.  KEY:[id number]

## 2023-12-08 NOTE — Telephone Encounter (Signed)
 Jemmie Rhinehart (Key: [id number]) PA Case ID #: [id number] Rx #: [id number] Need Help? Call us  at [phone] Outcome Approved today by [employer] 2017 NCPDP Request Reference Number: [id number]. LIDOCAINE  DIS 5% PATCH is approved through 02/28/2025. Your patient may now fill this prescription and it will be covered. Effective Date: 12/08/2023 Authorization Expiration Date: 02/28/2025 Drug Lidocaine  5% patches ePA cloud logo Form [employer] Medicare Part D Electronic Prior Authorization Form (2017 NCPDP) Original Claim Info [id number]  I attempted to call the patient regarding the approval. I was unable to reach the patient, I lvm for him to give us  a call back.

## 2023-12-08 NOTE — Congregational Nurse Program (Signed)
 Dept: [id number]   Congregational Nurse Program Note  Date of Encounter: 12/08/2023  Past Medical History: Past Medical History:  Diagnosis Date   Abnormal stress test    Acute left ankle pain 03/23/2023   Anxiety    Asthma    as a child   Atypical chest pain 05/07/2017   Chest pain  Unstable angina 06/15/2023   Chronic bilateral low back pain with left-sided sciatica 01/11/2023   Chronic health problem 06/15/2023   Chronic pain syndrome 03/29/2023   Cocaine abuse (HCC) 06/16/2023   Coronary artery disease    DDD (degenerative disc disease), lumbar 11/08/2019   Formatting of this note might be different from the original. 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 There is mild dilatation of the ascending aorta, measuring 43 mm. There is mild dilatation of the aortic root, measuring 44 mm.      Encounter for medication monitoring 01/10/2023   Encounter for well adult exam with abnormal findings 04/08/2023   Essential hypertension    Heart failure with mid-range ejection fraction (HFmEF) (HCC) 06/17/2023   06/16/23 transthoracic echocardiogram: Left Ventricle: Left ventricular ejection fraction 45 to 50%. The left ventricle has mildly decreased function. No left ventricular hypertrophy. 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 [place]    Hyperlipidemia    Impaired mobility and ADLs 03/23/2023   Ischemic cardiomyopathy 12/17/2020   Lumbar radiculopathy 07/05/2023   Myocardial infarction [facility]) 2017   NSTEMI (non-ST elevated myocardial infarction) (HCC) 11/28/2020   OAB (overactive bladder) 01/11/2023   PAD (peripheral artery disease) 09/21/2022   PVD (peripheral vascular disease) 09/21/2019   Right leg pain 03/23/2023   Shortness of breath    Sinus bradycardia 11/29/2019   Tobacco use disorder    Unhoused person 06/16/2023   Unstable angina (HCC) 06/15/2023     Encounter Details:  Community Questionnaire - 12/08/23 1447       Questionnaire   Ask client: Do you give verbal consent for me to treat you today? Yes    Student Assistance N/A    Location Patient Served  [facility]    Encounter Setting CN site    Population Status Unknown    Insurance Medicare    Insurance/Financial Assistance Referral N/A    Medication Patient Medications Reviewed    Medical Provider Yes    Screening Referrals Made N/A    Medical Referrals Made [facility] PCP[facility]inic    Medical Appointment Completed N/A    CNP Interventions Advocate/Support;Navigate Healthcare System;Case Management;Counsel;Educate;Spiritual Care    Screenings CN Performed N/A    ED Visit Averted Yes    Life-Saving Intervention Made Yes          Client to RN office for scheduled appointment. Client has had persistent angina after walking short differences. He is also out of Xarelto  and Carvedilol . Client states he has refills and will go pick up prescriptions. RN educated on the importance of having these medication. RN attempted to help client with cardiology appointment but unable to complete, client will follow up with provider. Unable to make cardiac rehab appointment until client is seen and stable with cardiology. RN educated client on slowing down pace of activity until seen by cardiology to prevent unnecessary heart stress. RN also educated again on nicotine  gum instead of smoking and client willing to try but insurance is unable to pay for medicine and will have to figure out  how we can make medication more affordable. No further concerns at this time.

## 2023-12-09 ENCOUNTER — Telehealth: Payer: Self-pay

## 2023-12-09 NOTE — Telephone Encounter (Signed)
 Patient would like to switch from Dr Madireddy to Dr. Deneise since he is no longer living in [place]. Please advise

## 2023-12-12 ENCOUNTER — Ambulatory Visit: Admitting: Gastroenterology

## 2023-12-12 ENCOUNTER — Ambulatory Visit: Attending: Emergency Medicine | Admitting: Emergency Medicine

## 2023-12-12 ENCOUNTER — Ambulatory Visit: Payer: Self-pay

## 2023-12-12 VITALS — BP 117/80 | HR 78 | Ht 72.0 in | Wt 208.6 lb

## 2023-12-12 DIAGNOSIS — I70321 Atherosclerosis of unspecified type of bypass graft(s) of the extremities with rest pain, right leg: Secondary | ICD-10-CM

## 2023-12-12 DIAGNOSIS — E782 Mixed hyperlipidemia: Secondary | ICD-10-CM

## 2023-12-12 DIAGNOSIS — I739 Peripheral vascular disease, unspecified: Secondary | ICD-10-CM

## 2023-12-12 DIAGNOSIS — I5020 Unspecified systolic (congestive) heart failure: Secondary | ICD-10-CM

## 2023-12-12 DIAGNOSIS — F1410 Cocaine abuse, uncomplicated: Secondary | ICD-10-CM

## 2023-12-12 DIAGNOSIS — I7143 Infrarenal abdominal aortic aneurysm, without rupture: Secondary | ICD-10-CM

## 2023-12-12 DIAGNOSIS — I10 Essential (primary) hypertension: Secondary | ICD-10-CM | POA: Diagnosis not present

## 2023-12-12 DIAGNOSIS — I25708 Atherosclerosis of coronary artery bypass graft(s), unspecified, with other forms of angina pectoris: Secondary | ICD-10-CM

## 2023-12-12 DIAGNOSIS — Z720 Tobacco use: Secondary | ICD-10-CM

## 2023-12-12 DIAGNOSIS — I77810 Thoracic aortic ectasia: Secondary | ICD-10-CM

## 2023-12-12 DIAGNOSIS — T82898A Other specified complication of vascular prosthetic devices, implants and grafts, initial encounter: Secondary | ICD-10-CM

## 2023-12-12 MED ORDER — AMLODIPINE BESYLATE 5 MG PO TABS: 5.0000 mg | ORAL_TABLET | Freq: Every day | ORAL | 3 refills | Status: DC

## 2023-12-12 MED ORDER — RIVAROXABAN 20 MG PO TABS: 20.0000 mg | ORAL_TABLET | Freq: Every day | ORAL | 1 refills | Status: DC

## 2023-12-12 MED ORDER — NITROGLYCERIN 0.4 MG SL SUBL: 0.4000 mg | SUBLINGUAL_TABLET | SUBLINGUAL | 3 refills | Status: AC | PRN

## 2023-12-12 NOTE — Patient Instructions (Addendum)
 Medication Instructions:  START TAKING AMLODIPINE  5 MG DAILY.  Lab Work: NONE TO BE DONE TODAY.  Testing/Procedures: NONE  Follow-Up: At [facility], you and your health needs are our priority.  As part of our continuing mission to provide you with exceptional heart care, our providers are all part of one team.  This team includes your primary Cardiologist (physician) and Advanced Practice Providers or APPs (Physician Assistants and Nurse Practitioners) who all work together to provide you with the care you need, when you need it.  Your next appointment:   January 12, 2024 AT 3:00 PM  Provider:   DR. REN, MD

## 2023-12-12 NOTE — Progress Notes (Signed)
 Cardiology Office Note:    Date:  12/13/2023  ID:  Benjamin Espinoza, DOB [birth year], MRN [id number] PCP: Benjamin Espinoza, [name], DO  [facility] Providers Cardiologist:  Benjamin VEAR Ren Donley, MD       Patient Profile:       Chief Complaint: Acute visit for chest pains History of Present Illness:  Benjamin Espinoza is a 58 y.o. male with visit-pertinent coronary artery disease, peripheral chill disease s/p redo right femoral-popliteal bypass, CHF with mildly reduced LVEF, mild aortic insufficiency, ascending aorta, hypertension, hyperlipidemia, prediabetes, polysubstance abuse, CKD  history of coronary artery disease s/p CABG in 2017 in [place] , s/p PCI of SVG-RPDA in 2021 was subsequently occluded in 2022, recently unstable angina s/p cath on 06/16/2023 (severe native disease, patent LIMA-mid LAD, distal AT occluded CTO, patent SVG-OM 3, patent SVG-OM1 with 80% stenosis, occluded SVG-RPDA, elevated LVEDP 19 mmHg and was recommended medical therapy in the setting of poor outflow from the patent grafts into the native vessels and concern about adherence of therapy and ongoing polysubstance abuse.  Echocardiogram 09/2023 with LVEF 50 to 55%, mild LVH, normal diastolic parameters, hypokinesis of basal septum unchanged from previous echo, RV normal, mild mitral valve regurgitation, mild aortic valve regurgitation, moderate dilation of aortic root measuring 46 mm, mild dilation of ascending aorta 42  He was last seen in clinic on 08/18/2023 by Dr. Liborio.  His blood pressure was uncontrolled at 150/100.  His Imdur  was increased to 120 mg daily.  He was recently started on losartan  by PCP however he appeared not to be taking it.  From an anginal standpoint he was stable and continue on his current GDMT.  He remained euvolemic with no complaints.  NYHA class I.  He was admitted to the hospital in 10/13/2023 for acute CVA.  MRI consistent with punctate acute cortical infarct in L sensory area that  is concordant with RUE numbness.  He reported to have left-sided chest pain at that time.  He was felt not to be a good candidate for PCI given ongoing substance abuse and noncompliance with medical therapy.  He was discharged AGAINST MEDICAL ADVICE.  He was admitted to the hospital 11/18/2023 for symptomatic anemia and acute blood loss anemia.  He presented with lightheadedness and dizziness after a fall.  Hemoglobin was 8.8 and FOBT positive.  GI was consulted and he was started on IV Protonix .  Colonoscopy did not show any signs of bleeding and just showed 1 polyp and a lipoma.  EGD did show erosive gastropathy.  He will need to follow-up results of these with pathology.  Hemoglobin stable at 10.2 at time of discharge.  Xarelto  and aspirin  were held.  Both aspirin  and Xarelto  to be resumed on 11/19/2023.   Discussed the use of AI scribe software for clinical note transcription with the patient, who gave verbal consent to proceed.  History of Present Illness Benjamin Espinoza is a 58 year old male with coronary artery disease who presents for an acute visit for chest pains.  He experiences chest pains four to five times daily for the past four months, described as a 'heartburn' sensation occurring both at rest and on exertion.  He denies any dyspnea, orthopnea, PND, LEE.  No palpitations, lightheadedness, dizziness.  He ceased crack cocaine use a month and a half ago following a stroke. He continues to smoke approximately seven cigarettes per day and uses marijuana daily.  He tells me he stays adherent to his medication regimen.  Although  he tells me he ran out of his Xarelto  several days ago and needs a refill.  He does also note he is taking his isosorbide  twice a day instead of once daily.    Review of systems:  Please see the history of present illness. All other systems are reviewed and otherwise negative.      Studies Reviewed:    EKG Interpretation Date/Time:  Monday December 12 2023  14:55:59 EDT Ventricular Rate:  80 PR Interval:  152 QRS Duration:  92 QT Interval:  406 QTC Calculation: 468 R Axis:   65  Text Interpretation: Normal sinus rhythm Possible Anterior infarct , age undetermined When compared with ECG of 18-Nov-2023 01:31, PREVIOUS ECG IS PRESENT Confirmed by Benjamin Espinoza [phone]) on 12/13/2023 3:48:06 PM    Echocardiogram 10/15/2023  1. Hypokinesis of basal septuml unchanged from previous Echo. Left  ventricular ejection fraction, by estimation, is 50 to 55%. The left  ventricle has low normal function. There is mild left ventricular  hypertrophy. Left ventricular diastolic parameters  were normal.   2. Right ventricular systolic function is normal. The right ventricular  size is normal.   3. The mitral valve is normal in structure. Mild mitral valve  regurgitation.   4. The aortic valve is tricuspid. Aortic valve regurgitation is mild.  Aortic valve sclerosis/calcification is present, without any evidence of  aortic stenosis.   5. Compared to previous echo, root is slightly bigger. . Aortic  dilatation noted. There is moderate dilatation of the aortic root,  measuring 46 mm. There is mild dilatation of the ascending aorta,  measuring 42 mm.   Cardiac catheterization 06/16/2023   Prox RCA lesion is 90% stenosed.   Prox RCA to Dist RCA lesion is 100% stenosed.   Prox Cx to Mid Cx lesion is 100% stenosed.   Ost LM to Mid LM lesion is 50% stenosed.   Ost LAD to Mid LAD lesion is 100% stenosed.   Dist LAD-1 lesion is 100% stenosed.   Dist LAD-2 lesion is 85% stenosed.   Prox Graft lesion is 20% stenosed.   1st Mrg-1 lesion is 95% stenosed.   1st Mrg-2 lesion is 95% stenosed.   1st Mrg-3 lesion is 95% stenosed.   1st Mrg-4 lesion is 90% stenosed.   Origin to Prox Graft lesion is 80% stenosed.   Origin to Prox Graft lesion is 100% stenosed.   SVG.   LV end diastolic pressure is mildly elevated.   Severe 3 vessel occlusive CAD.  Patent LIMA  to the LAD but this only supplies the mid LAD segment. The LAD is occluded distally (CTO) Patent SVG to the third OM Patent SVG to the first OM. There is 80% stenosis in the proximal SVG. This has progressed since 2022 Known occlusion of SVG to RCA Mildly elevated LVEDP   Plan: recommend resuming guideline directed medical therapy. Patient needs counseling on cessation of tobacco and cocaine abuse. While PCI of the SVG to OM1 could be considered I think this would actually be detrimental given ongoing polysubstance abuse and noncompliance with medical therapy. There is also poor outflow of this graft due to severe disease in the native vessel so long term patency of a stent would be poor.  Diagnostic Dominance: Right  Echocardiogram 06/16/2023 1. Left ventricular ejection fraction, by estimation, is 45 to 50%. The  left ventricle has mildly decreased function. The left ventricle  demonstrates regional wall motion abnormalities (see scoring  diagram/findings for description). Left ventricular  diastolic  parameters are consistent with Grade I diastolic dysfunction  (impaired relaxation).   2. Right ventricular systolic function is low normal. The right  ventricular size is normal.   3. The mitral valve is normal in structure. No evidence of mitral valve  regurgitation. No evidence of mitral stenosis.   4. The aortic valve is normal in structure. Aortic valve regurgitation is  mild. No aortic stenosis is present.   5. Aortic dilatation noted. There is mild dilatation of the ascending  aorta, measuring 43 mm. There is mild dilatation of the aortic root,  measuring 44 mm.   6. The inferior vena cava is normal in size with greater than 50%  respiratory variability, suggesting right atrial pressure of 3 mmHg.   Cardiac catheterization 12/01/2020   Ost LAD to Mid LAD lesion is 100% stenosed.   Prox RCA lesion is 90% stenosed.   Prox RCA to Dist RCA lesion is 100% stenosed.   Dist LAD-1 lesion  is 100% stenosed.   Dist LAD-2 lesion is 85% stenosed.   Prox Cx to Mid Cx lesion is 100% stenosed.   1st Mrg-1 lesion is 95% stenosed.   1st Mrg-2 lesion is 95% stenosed.   Origin to Prox Graft lesion is 40% stenosed.   1st Mrg-3 lesion is 95% stenosed.   1st Mrg-4 lesion is 90% stenosed.   Ost LM to Mid LM lesion is 50% stenosed.   Prox Graft lesion is 20% stenosed.   LV end diastolic pressure is normal.   Severe multivessel native coronary artery disease with 50% diffuse left main stenosis, total LAD occlusion at the ostium, high-grade segmental stenoses in the OM1 vessel proximal to and distal to the SVG anastomosis with total occlusion of the AV groove circumflex after the OM1 takeoff and 90% proximal and total occlusion of the proximal to mid native RCA with evidence for left to right distal collateralization via the left coronary system.   Patent LIMA to LAD with previously noted chronic total occlusion of the LAD moderate segment beyond the anastomosis with small caliber distal LAD vessel after the CTO which is mildly collateralized.   Patent SVG to the left circumflex OM 1 vessel with 40% eccentric proximal graft stenosis.  The OM vessel immediately after the anastomosis is 95% stenosed followed by diffuse 90% stenosis and is a very small caliber distal vessel.   Patent SVG supplying the OM 2 vessel with mild 20% narrowing in the proximal third of the graft.  The AV groove left circumflex fills retrograde to the proximal total occlusion arising distal to the OM1 takeoff.   Occluded saphenous vein graft which was previously stented in September 2021 with total occlusion of the ostial SVG stent with TIMI 0 flow.   Follow moderate LV dysfunction with EF estimated 40% with mid-distal inferior significant hypocontractility.  LVEDP 15 mmHg.   RECOMMENDATION: Angiograms will be reviewed with colleagues.  Compared to his September 2021 study, the vein graft supplying the RCA which was  stented ostially and distally is now totally occluded.  There is significant progression of OM1 stenoses beyond the SVG anastomosis and the vessel is very small caliber.  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  Most likely will require increased medical regimen but will review with CTO colleagues regarding potential attempt at LAD CTO intervention. Diagnostic Dominance: Right  Risk Assessment/Calculations:              Physical Exam:  VS:  BP 117/80   Pulse 78   Ht 6' (1.829 m)   Wt 208 lb 9.6 oz (94.6 kg)   SpO2 98%   BMI 28.29 kg/m    Wt Readings from Last 3 Encounters:  12/12/23 208 lb 9.6 oz (94.6 kg)  12/06/23 202 lb (91.6 kg)  11/19/23 195 lb (88.5 kg)    GEN: Well nourished, well developed in no acute distress NECK: No JVD; No carotid bruits CARDIAC: RRR, no murmurs, rubs, gallops RESPIRATORY:  Clear to auscultation without rales, wheezing or rhonchi  ABDOMEN: Soft, non-tender, non-distended EXTREMITIES:  No edema; No acute deformity      Assessment and Plan:  Coronary artery disease S/p CABG in 2017 in [place]  S/p PCI of SVG-RPDA in 2021 subsequently occluded on cath in 2022 Most recent cardiac cath 05/2023 showing severe native disease, patent LIMA-mid LAD and SVG-OM3, patent SVG-OM1 with 80% proximal stenosis, occluded SVG-RPDA.  Medical therapy was recommended and further described as noted above Echocardiogram 09/2023 with LVEF 50 to 55% - EKG today without acute ischemic changes - Reports no crack/cocaine use in 1 month.  Recent UDS was positive for opiates, he does report taking his girlfriends oxycodone  for pain recently - Today he presents with symptoms consistent with stable and predictable angina.  Described as heartburn occurring both at rest and on exertion.  Will plan to further titrate antianginal therapy - Plan to start amlodipine  5 mg daily.  Not interested in increasing carvedilol  due to side effects of  erectile dysfunction - He was previously taking his isosorbide  twice daily.  Have asked him to resume this at once daily at nighttime. - Continue aspirin  81 mg daily, atorvastatin  80 mg daily, carvedilol  6.25 mg twice daily, isosorbide  120 mg daily, losartan  50 mg daily, ranolazine  1000 mg twice daily - CBC and BMET today  Hypertension Blood pressure today is well-controlled at 117/80 - Continue current medication regimen with the exception of adding amlodipine  5 mg daily for further antianginal therapy as noted above  HFmrEF Echocardiogram 05/2023 LVEF 45 to 50% Echocardiogram 09/2023 with LVEF 50 to 55% NYHA class I - He appears euvolemic and well compensated on exam.  His volume status appears stable.  Does not appear to require loop diuretic therapy at this time - Continue carvedilol  6.25 mg twice daily and losartan  50 mg daily  History of recent CVA Patient had punctuate acute cortical infarct in left sensory area on 09/2023 - He denies any new neurological symptoms - Managed on aspirin , atorvastatin , and Xarelto  - He has not followed up with neurology  History of PAD s/p R fem/pop bypass on Xarelto  - Managed on aspirin , atorvastatin , and Xarelto  - He needs to schedule follow-up visit with vascular surgery as last seen on 09/2022 - Management per vascular surgery  Thoracic aortic aneurysm Echocardiogram 09/2023 with moderate dilation of the aortic root measuring 46 mm and mild dilation of the ascending aorta measuring 42 mm CTA 09/2023 shows aorta measures 4.7 cm at the level of the sinuses of valsalva and 3.7 cm at the level of the sinotubular junction, ascending aortic size of 4.0 cm - He will likely need a CTA aorta in 1 year for routine monitoring  Infrarenal abdominal aortic dilation CTA 09/2023 showed infrarenal abdominal aortic dilation of 3 cm - He will need a repeat ultrasound in 3 years for routine monitoring  Tobacco use Marijuana use He is currently smoking 7  cigarettes daily and smoking marijuana every day - Cessation highly encouraged  Cocaine use Reports no cocaine/crack use over the past month - Encouraged continued abstinence      Dispo:  Return in about 1 month (around 01/12/2024).  Signed, Lum LITTIE Louis, NP

## 2023-12-12 NOTE — Patient Instructions (Signed)
 You may have clear liquids beginning at 10:30 am after ingesting the capsule.    You can have a light lunch at 12:30 pm; sandwich and half bowl of soup.  Return to the office at 4 pm to return the equipment.   Return to you normal diet at 5 pm.   Call [phone] and ask for Hilma Favors BSN RN if you have any questions.  You should pass the capsule in your stool 8-48 hours after ingestion. If you have not passed the capsule, after 72 hours, please contact the office at [phone].

## 2023-12-12 NOTE — Telephone Encounter (Signed)
 FYI Only or Action Required?: Action required by provider: update on patient condition. Dispo ED- Refused  Patient was last seen in primary care on 12/06/2023 by [name], [name], DO.  Called Nurse Triage reporting Chest Pain.  Symptoms began several weeks ago.  Interventions attempted: Prescription medications: ranolazine .  Symptoms are: gradually worsening.  Triage Disposition: Call EMS 911 Now  Patient/caregiver understands and will follow disposition?: No, refuses disposition  Copied from CRM [id number]. Topic: Clinical - Red Word Triage >> Dec 12, 2023  9:39 AM DeAngela L wrote: Red Word that prompted transfer to Nurse Triage: patient states he is going through (angina/chest pains feels like a really bad heart burn and a lack of oxygen) about 4 or 5 times a days patient can't walk many steps and without the pain occurring over and again and feels like it's squeezing his chest and he has to stop walking and pause till the feeling stops   Patient called for a medication refill for  cilostazol  (PLETAL ) 100 MG tablet (Paused) rivaroxaban  (XARELTO ) 20 MG TABS tablet The patient states he made an appointment to see provider on 12/23/23 but really need this medication refilled to stop the chest pain he is having often  Pt num [phone] (M) Reason for Disposition  [1] Chest pain lasts > 5 minutes AND [2] age > 30 AND [3] one or more cardiac risk factors (e.g., diabetes, high blood pressure, high cholesterol, obesity with BMI 30 or higher, smoker, or strong family history of heart disease)  Answer Assessment - Initial Assessment Questions Recent admission 9/19-9/20- GI Bleed, unstable angina Chest pain 4-5times a day. Last night he had an episode he couldn't move, He couldn't take more then 10 steps and he has to take his ranolazine - having to take it more than prescribed. Out of Nitroglycerin .   Xartelo and Pletal  paused for GI consult after GI Bleed. Patient concerned that he is off  his medications and cannot get into see Cardiology until 10/24. He refused to go to the ED unless his CP is at rest and causes SOB. He states that with activity he gets this gripping chest pain but resolves afer resting. His cardiology appt is attached to the waitlist high priority. Advised I would reach out to the office to alert them to his status and not wanting to go to the ED. CAL contacted and message sent HP to the office  1. LOCATION: Where does it hurt?       Left chest  2. RADIATION: Does the pain go anywhere else? (e.g., into neck, jaw, arms, back)     Denies radiation  3. ONSET: When did the chest pain begin? (Minutes, hours or days)      Has been occurring since he got out of the hospital on 9/20 4. PATTERN: Does the pain come and go, or has it been constant since it started?  Does it get worse with exertion?      With exertion  5. DURATION: How long does it last (e.g., seconds, minutes, hours)     About 30 minutes with every time he is active.  6. SEVERITY: How bad is the pain?  (e.g., Scale 1-10; mild, moderate, or severe)     10/10 with activity- gripping pain  7. CARDIAC RISK FACTORS: Do you have any history of heart problems or risk factors for heart disease? (e.g., angina, prior heart attack; diabetes, high blood pressure, high cholesterol, smoker, or strong family history of heart disease)     CAD,  PAD, MI NSTEMI, HF, unstable angina 8. PULMONARY RISK FACTORS: Do you have any history of lung disease?  (e.g., blood clots in lung, asthma, emphysema, birth control pills)     asthma 9. CAUSE: What do you think is causing the chest pain?    Cadiac hx- recent Gibleed 10. OTHER SYMPTOMS: Do you have any other symptoms? (e.g., dizziness, nausea, vomiting, sweating, fever, difficulty breathing, cough)       denies  Protocols used: Chest Pain-A-AH

## 2023-12-12 NOTE — Progress Notes (Unsigned)
 SN: *** Exp: *** LOT: ***  Patient arrived for VCE. Reported the prep went well. This RN explained capsule dietary restrictions for the next few hours. Pt advised to return at 4 pm to return capsule equipment.  Patient verbalized understanding. Opened capsule, ensured capsule was flashing prior to the patient swallowing the capsule. Patient swallowed capsule without difficulty.  Patient told to call the office with any questions and if capsule has not passed after 72 hours. No further questions by the conclusion of the visit.

## 2023-12-13 ENCOUNTER — Encounter: Payer: Self-pay | Admitting: Emergency Medicine

## 2023-12-13 ENCOUNTER — Other Ambulatory Visit: Payer: Self-pay

## 2023-12-15 ENCOUNTER — Ambulatory Visit (INDEPENDENT_AMBULATORY_CARE_PROVIDER_SITE_OTHER): Admitting: Gastroenterology

## 2023-12-15 DIAGNOSIS — D509 Iron deficiency anemia, unspecified: Secondary | ICD-10-CM

## 2023-12-15 DIAGNOSIS — D62 Acute posthemorrhagic anemia: Secondary | ICD-10-CM | POA: Diagnosis not present

## 2023-12-15 DIAGNOSIS — R195 Other fecal abnormalities: Secondary | ICD-10-CM

## 2023-12-15 DIAGNOSIS — K2970 Gastritis, unspecified, without bleeding: Secondary | ICD-10-CM

## 2023-12-15 DIAGNOSIS — Z7901 Long term (current) use of anticoagulants: Secondary | ICD-10-CM | POA: Diagnosis not present

## 2023-12-15 NOTE — Patient Instructions (Signed)
 You may have clear liquids beginning at 10:30 am after ingesting the capsule.    You can have a light lunch at 12:30 pm; sandwich and half bowl of soup.  Return to the office at 4 pm to return the equipment.   Return to you normal diet at 5 pm.   Call [phone] and ask for Brandi RN  if you have any questions.  You should pass the capsule in your stool 8-48 hours after ingestion. If you have not passed the capsule, after 72 hours, please contact the office at [phone].

## 2023-12-15 NOTE — Progress Notes (Signed)
 SN: [id number] Exp: 06/07/2024 LOT: [id number]   Patient arrived for VCE. Reported the prep went well. This RN explained capsule dietary restrictions for the next few hours. Pt advised to return at 4 pm to return capsule equipment.  Patient verbalized understanding. Opened capsule, ensured capsule was flashing prior to the patient swallowing the capsule. Patient swallowed capsule without difficulty.  Patient told to call the office with any questions and if capsule has not passed after 72 hours. No further questions by the conclusion of the visit.

## 2023-12-15 NOTE — Congregational Nurse Program (Signed)
  Dept: [phone]   Congregational Nurse Program Note  Date of Encounter: 12/15/2023  Past Medical History: Past Medical History:  Diagnosis Date   Abnormal stress test    Acute left ankle pain 03/23/2023   Anxiety    Asthma    as a child   Atypical chest pain 05/07/2017   Chest pain  Unstable angina 06/15/2023   Chronic bilateral low back pain with left-sided sciatica 01/11/2023   Chronic health problem 06/15/2023   Chronic pain syndrome 03/29/2023   Cocaine abuse (HCC) 06/16/2023   Coronary artery disease    DDD (degenerative disc disease), lumbar 11/08/2019   Formatting of this note might be different from the original. 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 There is mild dilatation of the ascending aorta, measuring 43 mm. There is mild dilatation of the aortic root, measuring 44 mm.      Encounter for medication monitoring 01/10/2023   Encounter for well adult exam with abnormal findings 04/08/2023   Essential hypertension    Heart failure with mid-range ejection fraction (HFmEF) (HCC) 06/17/2023   06/16/23 transthoracic echocardiogram: Left Ventricle: Left ventricular ejection fraction 45 to 50%. The left ventricle has mildly decreased function. No left ventricular hypertrophy. 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 [place]    Hyperlipidemia    Impaired mobility and ADLs 03/23/2023   Ischemic cardiomyopathy 12/17/2020   Lumbar radiculopathy 07/05/2023   Myocardial infarction [facility]) 2017   NSTEMI (non-ST elevated myocardial infarction) (HCC) 11/28/2020   OAB (overactive bladder) 01/11/2023   PAD (peripheral artery disease) 09/21/2022   PVD (peripheral vascular disease) 09/21/2019   Right leg pain 03/23/2023   Shortness of breath    Sinus bradycardia 11/29/2019   Tobacco use disorder    Unhoused person 06/16/2023   Unstable angina (HCC) 06/15/2023     Encounter Details:  Community Questionnaire - 12/15/23 1434       Questionnaire   Ask client: Do you give verbal consent for me to treat you today? Yes    Student Assistance N/A    Location Patient Served  [facility]    Encounter Setting CN site    Population Status Unknown    Insurance Medicare    Insurance/Financial Assistance Referral N/A    Medication Patient Medications Reviewed    Medical Provider Yes    Screening Referrals Made N/A    Medical Referrals Made [facility]    Medical Appointment Completed [facility]    CNP Interventions Advocate/Support;Navigate Healthcare System;Case Management;Counsel;Educate;Spiritual Care    Screenings CN Performed N/A    ED Visit Averted Yes    Life-Saving Intervention Made Yes         Client to RN office for follow up appointment to discuss health after visit. Client is wearing device from GI and getting it removed today. RN able to assist with transportation to medical appointment with bus passes. Client is also stated he has been feeling a lot better after seeing providers and hasn't had further chest pain since then. He also requested a fu appointment with provider for assistance with medication for intimacy with his male partner. Appointment made for Nov. 4th, all other appointments given to client as well. No further concerns at this time.

## 2023-12-23 ENCOUNTER — Ambulatory Visit: Admitting: Emergency Medicine

## 2024-01-03 ENCOUNTER — Ambulatory Visit: Payer: Self-pay | Admitting: Student

## 2024-01-10 NOTE — Progress Notes (Signed)
 Patient's video capsule reviewed.  There were no findings to explain iron deficiency anemia.  Specifically, no AVMs, ulcers/inflammatory changes or mass lesions.  Etiology of acute drop in hemoglobin unclear.  Recommend continued outpatient monitoring of hgb as outpatient.

## 2024-01-12 ENCOUNTER — Ambulatory Visit: Admitting: Student

## 2024-01-12 ENCOUNTER — Ambulatory Visit

## 2024-01-12 NOTE — Progress Notes (Deleted)
    Cardiology Office Note Date:  01/12/2024  ID:  Benjamin Espinoza, DOB [birth year], MRN [id number] PCP:  [name], [name], DO  Cardiologist:   [name], MD  No chief complaint on file.     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 male who presents for follow up.  ROS: Please see the history of present illness. All other systems are reviewed and negative.    PHYSICAL EXAM: VS:  There were no vitals taken for this visit. , BMI There is no height or weight on file to calculate BMI. GEN: Well nourished, well developed, in no acute distress HEENT: normal Neck: no JVD, carotid bruits, or masses Cardiac: ***RRR; no murmurs, rubs, or gallops,no edema  Respiratory:  CTAB bilaterally, normal work of breathing GI: soft, nontender, nondistended, + BS Extremities: No LE edema Skin: warm and dry, no rash Neuro:  Strength and sensation are intact  EKG: ***  Recent Labs: Reviewed  Studies: Reviewed  ASSESSMENT AND PLAN: Benjamin Espinoza is a 58 y.o. male who presents for new visit.  - *** - *** - *** - ***   Signed, [name], MD   01/12/2024 8:50 AM    [facility]

## 2024-01-17 ENCOUNTER — Encounter: Payer: Self-pay | Admitting: Gastroenterology

## 2024-01-30 ENCOUNTER — Inpatient Hospital Stay (HOSPITAL_COMMUNITY): Admission: EM | Admit: 2024-01-30 | Discharge: 2024-02-04 | DRG: 270 | Disposition: A

## 2024-01-30 ENCOUNTER — Other Ambulatory Visit: Payer: Self-pay

## 2024-01-30 ENCOUNTER — Emergency Department (HOSPITAL_COMMUNITY)

## 2024-01-30 ENCOUNTER — Encounter (HOSPITAL_COMMUNITY): Payer: Self-pay

## 2024-01-30 DIAGNOSIS — E785 Hyperlipidemia, unspecified: Secondary | ICD-10-CM | POA: Diagnosis present

## 2024-01-30 DIAGNOSIS — I130 Hypertensive heart and chronic kidney disease with heart failure and stage 1 through stage 4 chronic kidney disease, or unspecified chronic kidney disease: Secondary | ICD-10-CM | POA: Diagnosis present

## 2024-01-30 DIAGNOSIS — F1410 Cocaine abuse, uncomplicated: Secondary | ICD-10-CM | POA: Diagnosis present

## 2024-01-30 DIAGNOSIS — Z9889 Other specified postprocedural states: Secondary | ICD-10-CM | POA: Diagnosis not present

## 2024-01-30 DIAGNOSIS — I739 Peripheral vascular disease, unspecified: Secondary | ICD-10-CM | POA: Diagnosis present

## 2024-01-30 DIAGNOSIS — Z8673 Personal history of transient ischemic attack (TIA), and cerebral infarction without residual deficits: Secondary | ICD-10-CM

## 2024-01-30 DIAGNOSIS — Z79899 Other long term (current) drug therapy: Secondary | ICD-10-CM

## 2024-01-30 DIAGNOSIS — Z951 Presence of aortocoronary bypass graft: Secondary | ICD-10-CM | POA: Diagnosis not present

## 2024-01-30 DIAGNOSIS — Z8249 Family history of ischemic heart disease and other diseases of the circulatory system: Secondary | ICD-10-CM

## 2024-01-30 DIAGNOSIS — I7143 Infrarenal abdominal aortic aneurysm, without rupture: Secondary | ICD-10-CM | POA: Diagnosis present

## 2024-01-30 DIAGNOSIS — I998 Other disorder of circulatory system: Secondary | ICD-10-CM | POA: Diagnosis not present

## 2024-01-30 DIAGNOSIS — Y832 Surgical operation with anastomosis, bypass or graft as the cause of abnormal reaction of the patient, or of later complication, without mention of misadventure at the time of the procedure: Secondary | ICD-10-CM | POA: Diagnosis present

## 2024-01-30 DIAGNOSIS — F17200 Nicotine dependence, unspecified, uncomplicated: Secondary | ICD-10-CM | POA: Diagnosis present

## 2024-01-30 DIAGNOSIS — I5022 Chronic systolic (congestive) heart failure: Secondary | ICD-10-CM | POA: Diagnosis present

## 2024-01-30 DIAGNOSIS — R58 Hemorrhage, not elsewhere classified: Secondary | ICD-10-CM | POA: Diagnosis not present

## 2024-01-30 DIAGNOSIS — N1831 Chronic kidney disease, stage 3a: Secondary | ICD-10-CM | POA: Diagnosis present

## 2024-01-30 DIAGNOSIS — G894 Chronic pain syndrome: Secondary | ICD-10-CM | POA: Diagnosis present

## 2024-01-30 DIAGNOSIS — I255 Ischemic cardiomyopathy: Secondary | ICD-10-CM | POA: Diagnosis present

## 2024-01-30 DIAGNOSIS — Z91148 Patient's other noncompliance with medication regimen for other reason: Secondary | ICD-10-CM

## 2024-01-30 DIAGNOSIS — Z720 Tobacco use: Secondary | ICD-10-CM | POA: Diagnosis not present

## 2024-01-30 DIAGNOSIS — I21A1 Myocardial infarction type 2: Secondary | ICD-10-CM | POA: Diagnosis present

## 2024-01-30 DIAGNOSIS — F17290 Nicotine dependence, other tobacco product, uncomplicated: Secondary | ICD-10-CM | POA: Diagnosis present

## 2024-01-30 DIAGNOSIS — R0789 Other chest pain: Secondary | ICD-10-CM | POA: Diagnosis present

## 2024-01-30 DIAGNOSIS — F17210 Nicotine dependence, cigarettes, uncomplicated: Secondary | ICD-10-CM | POA: Diagnosis present

## 2024-01-30 DIAGNOSIS — I252 Old myocardial infarction: Secondary | ICD-10-CM

## 2024-01-30 DIAGNOSIS — I70221 Atherosclerosis of native arteries of extremities with rest pain, right leg: Principal | ICD-10-CM | POA: Diagnosis present

## 2024-01-30 DIAGNOSIS — F1290 Cannabis use, unspecified, uncomplicated: Secondary | ICD-10-CM | POA: Diagnosis present

## 2024-01-30 DIAGNOSIS — Z86718 Personal history of other venous thrombosis and embolism: Secondary | ICD-10-CM

## 2024-01-30 DIAGNOSIS — R0602 Shortness of breath: Secondary | ICD-10-CM | POA: Diagnosis present

## 2024-01-30 DIAGNOSIS — Z7982 Long term (current) use of aspirin: Secondary | ICD-10-CM

## 2024-01-30 DIAGNOSIS — F1910 Other psychoactive substance abuse, uncomplicated: Secondary | ICD-10-CM | POA: Diagnosis present

## 2024-01-30 DIAGNOSIS — F419 Anxiety disorder, unspecified: Secondary | ICD-10-CM | POA: Diagnosis present

## 2024-01-30 DIAGNOSIS — I2510 Atherosclerotic heart disease of native coronary artery without angina pectoris: Secondary | ICD-10-CM | POA: Diagnosis present

## 2024-01-30 DIAGNOSIS — T82898A Other specified complication of vascular prosthetic devices, implants and grafts, initial encounter: Principal | ICD-10-CM | POA: Diagnosis present

## 2024-01-30 DIAGNOSIS — I25810 Atherosclerosis of coronary artery bypass graft(s) without angina pectoris: Secondary | ICD-10-CM | POA: Diagnosis present

## 2024-01-30 DIAGNOSIS — I743 Embolism and thrombosis of arteries of the lower extremities: Secondary | ICD-10-CM | POA: Diagnosis present

## 2024-01-30 DIAGNOSIS — K219 Gastro-esophageal reflux disease without esophagitis: Secondary | ICD-10-CM | POA: Diagnosis present

## 2024-01-30 DIAGNOSIS — I700 Atherosclerosis of aorta: Secondary | ICD-10-CM | POA: Diagnosis present

## 2024-01-30 DIAGNOSIS — M79604 Pain in right leg: Secondary | ICD-10-CM | POA: Diagnosis present

## 2024-01-30 DIAGNOSIS — Z5900 Homelessness unspecified: Secondary | ICD-10-CM | POA: Diagnosis present

## 2024-01-30 DIAGNOSIS — Z7901 Long term (current) use of anticoagulants: Secondary | ICD-10-CM

## 2024-01-30 LAB — URINALYSIS, ROUTINE W REFLEX MICROSCOPIC
Bilirubin Urine: NEGATIVE
Glucose, UA: NEGATIVE mg/dL
Hgb urine dipstick: NEGATIVE
Ketones, ur: NEGATIVE mg/dL
Leukocytes,Ua: NEGATIVE
Nitrite: NEGATIVE
Protein, ur: 30 mg/dL — AB
Specific Gravity, Urine: 1.029 (ref 1.005–1.030)
pH: 5 (ref 5.0–8.0)

## 2024-01-30 LAB — BASIC METABOLIC PANEL WITH GFR
Anion gap: 10 (ref 5–15)
BUN: 18 mg/dL (ref 6–20)
CO2: 23 mmol/L (ref 22–32)
Calcium: 10.1 mg/dL (ref 8.9–10.3)
Chloride: 106 mmol/L (ref 98–111)
Creatinine, Ser: 1.48 mg/dL — ABNORMAL HIGH (ref 0.61–1.24)
GFR, Estimated: 55 mL/min — ABNORMAL LOW (ref >60)
Glucose, Bld: 84 mg/dL (ref 70–99)
Potassium: 4.3 mmol/L (ref 3.5–5.1)
Sodium: 139 mmol/L (ref 135–145)

## 2024-01-30 LAB — CBC
HCT: 47.2 % (ref 39.0–52.0)
Hemoglobin: 15.2 g/dL (ref 13.0–17.0)
MCH: 30 pg (ref 26.0–34.0)
MCHC: 32.2 g/dL (ref 30.0–36.0)
MCV: 93.3 fL (ref 80.0–100.0)
Platelets: 237 K/uL (ref 150–400)
RBC: 5.06 MIL/uL (ref 4.22–5.81)
RDW: 15 % (ref 11.5–15.5)
WBC: 4.4 K/uL (ref 4.0–10.5)
nRBC: 0 % (ref 0.0–0.2)

## 2024-01-30 LAB — RAPID URINE DRUG SCREEN, HOSP PERFORMED
Amphetamines: NOT DETECTED
Barbiturates: NOT DETECTED
Benzodiazepines: NOT DETECTED
Cocaine: POSITIVE — AB
Opiates: NOT DETECTED
Tetrahydrocannabinol: POSITIVE — AB

## 2024-01-30 LAB — TROPONIN I (HIGH SENSITIVITY)
Troponin I (High Sensitivity): 19 ng/L — ABNORMAL HIGH (ref <18)
Troponin I (High Sensitivity): 33 ng/L — ABNORMAL HIGH (ref <18)

## 2024-01-30 MED ORDER — OXYCODONE HCL 5 MG PO TABS
5.0000 mg | ORAL_TABLET | ORAL | Status: DC | PRN
  Administered 2024-01-30 – 2024-02-04 (×14): 5 mg via ORAL
  Filled 2024-01-30 (×15): qty 1

## 2024-01-30 MED ORDER — SODIUM CHLORIDE 0.9 % IV BOLUS
500.0000 mL | Freq: Once | INTRAVENOUS | Status: AC
  Administered 2024-01-30: 500 mL via INTRAVENOUS

## 2024-01-30 MED ORDER — MORPHINE SULFATE (PF) 4 MG/ML IV SOLN
4.0000 mg | Freq: Once | INTRAVENOUS | Status: AC
  Administered 2024-01-30: 4 mg via INTRAVENOUS
  Filled 2024-01-30: qty 1

## 2024-01-30 MED ORDER — HEPARIN BOLUS VIA INFUSION
4500.0000 [IU] | Freq: Once | INTRAVENOUS | Status: AC
  Administered 2024-01-30: 4500 [IU] via INTRAVENOUS
  Filled 2024-01-30: qty 4500

## 2024-01-30 MED ORDER — RANOLAZINE ER 500 MG PO TB12
1000.0000 mg | ORAL_TABLET | Freq: Every day | ORAL | Status: DC
  Administered 2024-01-31 – 2024-02-03 (×4): 1000 mg via ORAL
  Filled 2024-01-30 (×4): qty 2

## 2024-01-30 MED ORDER — LIDOCAINE 5 % EX PTCH
1.0000 | MEDICATED_PATCH | Freq: Every day | CUTANEOUS | Status: DC
  Administered 2024-01-30 – 2024-01-31 (×2): 1 via TRANSDERMAL
  Filled 2024-01-30 (×2): qty 1

## 2024-01-30 MED ORDER — IOHEXOL 350 MG/ML SOLN
100.0000 mL | Freq: Once | INTRAVENOUS | Status: AC | PRN
  Administered 2024-01-30: 100 mL via INTRAVENOUS

## 2024-01-30 MED ORDER — ORAL CARE MOUTH RINSE: 15.0000 mL | OROMUCOSAL | Status: DC | PRN

## 2024-01-30 MED ORDER — HYDROMORPHONE HCL 1 MG/ML IJ SOLN
1.0000 mg | Freq: Once | INTRAMUSCULAR | Status: AC
  Administered 2024-01-30: 1 mg via INTRAVENOUS
  Filled 2024-01-30: qty 1

## 2024-01-30 MED ORDER — NICOTINE 14 MG/24HR TD PT24
14.0000 mg | MEDICATED_PATCH | Freq: Every day | TRANSDERMAL | Status: DC
  Administered 2024-01-30 – 2024-02-04 (×6): 14 mg via TRANSDERMAL
  Filled 2024-01-30 (×6): qty 1

## 2024-01-30 MED ORDER — ONDANSETRON 4 MG PO TBDP
4.0000 mg | ORAL_TABLET | Freq: Three times a day (TID) | ORAL | Status: DC | PRN
  Administered 2024-01-30: 4 mg via ORAL
  Filled 2024-01-30: qty 1

## 2024-01-30 MED ORDER — HEPARIN (PORCINE) 25000 UT/250ML-% IV SOLN
1500.0000 [IU]/h | INTRAVENOUS | Status: DC
  Administered 2024-01-30: 1500 [IU]/h via INTRAVENOUS
  Administered 2024-01-31: 1350 [IU]/h via INTRAVENOUS
  Filled 2024-01-30 (×2): qty 250
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
## 2024-01-30 NOTE — Consult Note (Signed)
 Hospital Consult    Reason for Consult: Right lower extremity rest pain Requesting Physician: ED MRN #:  [id number]  History of Present Illness: This is a 58 y.o. male well-known to the vascular surgery service having previously undergone 2 send distal bypasses in the right lower extremity.  Most recent bypass was in 2024, a right common femoral artery to below-knee pop using ringed PTFE.  He was lost to follow-up, and presents to the ED with a 1-1/7-month history of right lower extremity rest pain.  States the pain has continued to worsen. Has small ulcerations at the level of the toes.  Denies drainage.  States he has sensation, and similar, however and feels as though his foot is asleep, and has been for several weeks.  Patient continues to use cocaine and smoke cigarettes.  Past Medical History:  Diagnosis Date   Abnormal stress test    Acute left ankle pain 03/23/2023   Anxiety    Asthma    as a child   Atypical chest pain 05/07/2017   Chest pain  Unstable angina 06/15/2023   Chronic bilateral low back pain with left-sided sciatica 01/11/2023   Chronic health problem 06/15/2023   Chronic pain syndrome 03/29/2023   Cocaine abuse (HCC) 06/16/2023   Coronary artery disease    DDD (degenerative disc disease), lumbar 11/08/2019   Formatting of this note might be different from the original. 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 There is mild dilatation of the ascending aorta, measuring 43 mm. There is mild dilatation of the aortic root, measuring 44 mm.      Encounter for medication monitoring 01/10/2023   Encounter for well adult exam with abnormal findings 04/08/2023   Essential hypertension    Heart failure with mid-range ejection fraction (HFmEF) (HCC) 06/17/2023   06/16/23 transthoracic echocardiogram: Left Ventricle: Left ventricular ejection fraction 45 to 50%. The left ventricle has mildly  decreased function. No left ventricular hypertrophy. 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 [place]    Hyperlipidemia    Impaired mobility and ADLs 03/23/2023   Ischemic cardiomyopathy 12/17/2020   Lumbar radiculopathy 07/05/2023   Myocardial infarction [facility]) 2017   NSTEMI (non-ST elevated myocardial infarction) (HCC) 11/28/2020   OAB (overactive bladder) 01/11/2023   PAD (peripheral artery disease) 09/21/2022   PVD (peripheral vascular disease) 09/21/2019   Right leg pain 03/23/2023   Shortness of breath    Sinus bradycardia 11/29/2019   Tobacco use disorder    Unhoused person 06/16/2023   Unstable angina (HCC) 06/15/2023    Past Surgical History:  Procedure Laterality Date   ABDOMINAL AORTOGRAM W/LOWER EXTREMITY Bilateral 09/21/2019   Procedure: ABDOMINAL AORTOGRAM W/LOWER EXTREMITY;  Surgeon: Eliza Lonni RAMAN, MD;  Location: [facility] INVASIVE CV LAB;  Service: Cardiovascular;  Laterality: Bilateral;   ABDOMINAL AORTOGRAM W/LOWER EXTREMITY N/A 12/07/2019   Procedure: ABDOMINAL AORTOGRAM W/LOWER EXTREMITY;  Surgeon: Eliza Lonni RAMAN, MD;  Location: [facility] INVASIVE CV LAB;  Service: Cardiovascular;  Laterality: N/A;   ABDOMINAL AORTOGRAM W/LOWER EXTREMITY N/A 09/09/2022   Procedure: ABDOMINAL AORTOGRAM W/LOWER EXTREMITY;  Surgeon: Gretta Lonni PARAS, MD;  Location: [facility] INVASIVE CV LAB;  Service: Vascular;  Laterality: N/A;   CARDIAC CATHETERIZATION     COLONOSCOPY N/A 11/19/2023   Procedure: COLONOSCOPY;  Surgeon: Stacia Glendia BRAVO, MD;  Location: [facility] ENDOSCOPY;  Service: Gastroenterology;  Laterality: N/A;   CORONARY ARTERY BYPASS GRAFT  quadruple bypas 2017   CORONARY STENT INTERVENTION N/A 11/26/2019   Procedure: CORONARY STENT INTERVENTION;  Surgeon: Mady Bruckner, MD;  Location: [facility] INVASIVE CV LAB;  Service: Cardiovascular;  Laterality: N/A;   ESOPHAGOGASTRODUODENOSCOPY N/A 11/19/2023   Procedure: EGD (ESOPHAGOGASTRODUODENOSCOPY);  Surgeon:  Stacia Glendia BRAVO, MD;  Location: [facility] ENDOSCOPY;  Service: Gastroenterology;  Laterality: N/A;   FEMORAL-POPLITEAL BYPASS GRAFT Right 12/25/2019   Procedure: BYPASS GRAFT FEMORAL- BELOW KNEE POPLITEAL ARTERY RIGHT USING GORE PROPATEN VASCULAR GRAFT;  Surgeon: Eliza Bruckner RAMAN, MD;  Location: [facility] OR;  Service: Vascular;  Laterality: Right;   FEMORAL-POPLITEAL BYPASS GRAFT Right 09/21/2022   Procedure: REDO RIGHT LOWER EXTREMITY BYPASS USING GORE PROPATEN 6mm REMOVALBLE RING GRAFT;  Surgeon: Eliza Bruckner RAMAN, MD;  Location: [facility] OR;  Service: Vascular;  Laterality: Right;   INTRAOPERATIVE ARTERIOGRAM Right 09/21/2022   Procedure: INTRA OPERATIVE ARTERIOGRAM;  Surgeon: Eliza Bruckner RAMAN, MD;  Location: [facility] OR;  Service: Vascular;  Laterality: Right;   LEFT HEART CATH AND CORS/GRAFTS ANGIOGRAPHY N/A 11/26/2019   Procedure: LEFT HEART CATH AND CORS/GRAFTS ANGIOGRAPHY;  Surgeon: Mady Bruckner, MD;  Location: [facility] INVASIVE CV LAB;  Service: Cardiovascular;  Laterality: N/A;   LEFT HEART CATH AND CORS/GRAFTS ANGIOGRAPHY N/A 12/01/2020   Procedure: LEFT HEART CATH AND CORS/GRAFTS ANGIOGRAPHY;  Surgeon: Burnard Debby LABOR, MD;  Location: [facility] INVASIVE CV LAB;  Service: Cardiovascular;  Laterality: N/A;   LEFT HEART CATH AND CORS/GRAFTS ANGIOGRAPHY N/A 06/16/2023   Procedure: LEFT HEART CATH AND CORS/GRAFTS ANGIOGRAPHY;  Surgeon: Jordan, Peter M, MD;  Location: [facility] INVASIVE CV LAB;  Service: Cardiovascular;  Laterality: N/A;   LOWER EXTREMITY ANGIOGRAM Right 12/25/2019   Procedure: RIGHT LOWER EXTREMITY ANGIOGRAM;  Surgeon: Eliza Bruckner RAMAN, MD;  Location: [facility] OR;  Service: Vascular;  Laterality: Right;   PERIPHERAL VASCULAR BALLOON ANGIOPLASTY Left 12/07/2019   Procedure: PERIPHERAL VASCULAR BALLOON ANGIOPLASTY;  Surgeon: Eliza Bruckner RAMAN, MD;  Location: [facility] INVASIVE CV LAB;  Service: Cardiovascular;  Laterality: Left;  SFA    No Known Allergies  Prior to Admission medications    Medication Sig Start Date End Date Taking? Authorizing Provider  amLODipine  (NORVASC ) 5 MG tablet Take 1 tablet (5 mg total) by mouth daily. 12/12/23 03/11/24  Rana Lum CROME, NP  amoxicillin (AMOXIL) 500 MG tablet Take 500 mg by mouth 4 (four) times daily. 12/08/23   [provider]  aspirin  EC 81 MG tablet Take 1 tablet (81 mg total) by mouth daily. Patient not taking: Reported on 12/12/2023 11/06/23   Dean Clarity, MD  atorvastatin  (LIPITOR ) 80 MG tablet Take 1 tablet (80 mg total) by mouth daily. 11/06/23   Dean Clarity, MD  carvedilol  (COREG ) 6.25 MG tablet Take 1 tablet (6.25 mg total) by mouth 2 (two) times daily with a meal. 11/06/23   Dean Clarity, MD  cilostazol  (PLETAL ) 100 MG tablet Take 1 tablet (100 mg total) by mouth 2 (two) times daily. Patient not taking: Reported on 12/12/2023 11/06/23   Dean Clarity, MD  gabapentin  (NEURONTIN ) 300 MG capsule Take 1 capsule (300 mg total) by mouth 3 (three) times daily. Patient taking differently: Take 300 mg by mouth 3 (three) times daily as needed (neuropathy, pain). 11/06/23   Haviland, Julie, MD  isosorbide  mononitrate (IMDUR ) 120 MG 24 hr tablet Take 1 tablet (120 mg total) by mouth daily. Patient taking differently: Take 120 mg by mouth 2 (two) times daily. 11/06/23   Dean Clarity, MD  loratadine  (CLARITIN ) 10 MG tablet Take 1 tablet (10 mg total)  by mouth daily. 12/01/23   Kennyth Domino, FNP  losartan  (COZAAR ) 50 MG tablet Take 1 tablet (50 mg total) by mouth daily. 11/06/23   Dean Clarity, MD  nicotine  (NICODERM CQ  - DOSED IN MG/24 HOURS) 14 mg/24hr patch Place 1 patch (14 mg total) onto the skin daily. 11/19/23   [name], [name], DO  nicotine  polacrilex (NICORETTE ) 4 MG gum Take 1 each (4 mg total) by mouth as needed for smoking cessation. 12/01/23   Kennyth Domino, FNP  nitroGLYCERIN  (NITROSTAT ) 0.4 MG SL tablet Place 1 tablet (0.4 mg total) under the tongue every 5 (five) minutes as needed for chest pain. 12/12/23    Rana Lum CROME, NP  pantoprazole  (PROTONIX ) 40 MG tablet Take 1 tablet (40 mg total) by mouth 2 (two) times daily. 11/19/23 12/19/23  [name], [name], DO  ranolazine  (RANEXA ) 1000 MG SR tablet Take 1 tablet (1,000 mg total) by mouth 2 (two) times daily. Patient taking differently: Take 1,000 mg by mouth daily. 11/06/23   Dean Clarity, MD  rivaroxaban  (XARELTO ) 20 MG TABS tablet Take 1 tablet (20 mg total) by mouth daily with supper. 12/12/23   Rana Lum CROME, NP  triamcinolone  cream (KENALOG ) 0.1 % Apply 1 Application topically 2 (two) times daily. 12/01/23   Kennyth Domino, FNP    Social History   Socioeconomic History   Marital status: Married    Spouse name: Not on file   Number of children: Not on file   Years of education: Not on file   Highest education level: Not on file  Occupational History   Not on file  Tobacco Use   Smoking status: Every Day    Types: Cigars, Cigarettes    Passive exposure: Current (Wife is a smoker)   Smokeless tobacco: Never   Tobacco comments:    as of 12/20/19 2 cigararettes/day  Vaping Use   Vaping status: Never Used  Substance and Sexual Activity   Alcohol use: No   Drug use: Yes    Types: Marijuana, Cocaine   Sexual activity: Not Currently  Other Topics Concern   Not on file  Social History Narrative   Lives with alone but has a caregiver   Social Drivers of Corporate Investment Banker Strain: High Risk (06/09/2023)   Overall Financial Resource Strain (CARDIA)    Difficulty of Paying Living Expenses: Very hard  Food Insecurity: Food Insecurity Present (11/18/2023)   Hunger Vital Sign    Worried About Running Out of Food in the Last Year: Sometimes true    Ran Out of Food in the Last Year: Sometimes true  Transportation Needs: Unmet Transportation Needs (11/18/2023)   PRAPARE - Administrator, Civil Service (Medical): Yes    Lack of Transportation (Non-Medical): No  Physical Activity: Inactive (03/25/2023)    Exercise Vital Sign    Days of Exercise per Week: 0 days    Minutes of Exercise per Session: 0 min  Stress: Stress Concern Present (11/25/2023)   [employer] of Occupational Health - Occupational Stress Questionnaire    Feeling of Stress: To some extent  Social Connections: Socially Integrated (06/16/2023)   Social Connection and Isolation Panel    Frequency of Communication with Friends and Family: More than three times a week    Frequency of Social Gatherings with Friends and Family: Never    Attends Religious Services: More than 4 times per year    Active Member of [employer] or Organizations: Yes    Attends Banker  Meetings: Never    Marital Status: Married  Catering Manager Violence: Not At Risk (11/18/2023)   Humiliation, Afraid, Rape, and Kick questionnaire    Fear of Current or Ex-Partner: No    Emotionally Abused: No    Physically Abused: No    Sexually Abused: No   Family History  Problem Relation Age of Onset   Heart disease Mother    Heart disease Father    Heart disease Sister     ROS: Otherwise negative unless mentioned in HPI  Physical Examination  Vitals:   01/30/24 1715 01/30/24 1800  BP: 118/83 124/84  Pulse:  79  Resp: 18 16  Temp:    SpO2:  98%   There is no height or weight on file to calculate BMI.  General:  WDWN in NAD Gait: Not observed HENT: WNL, normocephalic Pulmonary: normal non-labored breathing, without Rales, rhonchi,  wheezing Cardiac: regular, without  Murmurs, rubs or gallops; without carotid bruits Abdomen: soft, NT/ND, no masses Skin: without rashes Vascular Exam/Pulses: Palpable femoral pulses, nonpalpable pulses in the right foot, palpable dorsalis pedis in the left foot Extremities: With ischemic changes, without without gangrene , without cellulitis; small open wounds;  Musculoskeletal: no muscle wasting or atrophy  Neurologic: A&O X 3;  No focal weakness or paresthesias are detected; speech is  fluent/normal Psychiatric:  The pt has Normal affect. Lymph:  Unremarkable  CBC    Component Value Date/Time   WBC 4.4 01/30/2024 1337   RBC 5.06 01/30/2024 1337   HGB 15.2 01/30/2024 1337   HGB 14.2 04/09/2021 0904   HCT 47.2 01/30/2024 1337   HCT 41.6 04/09/2021 0904   PLT 237 01/30/2024 1337   PLT 231 04/09/2021 0904   MCV 93.3 01/30/2024 1337   MCV 92 04/09/2021 0904   MCH 30.0 01/30/2024 1337   MCHC 32.2 01/30/2024 1337   RDW 15.0 01/30/2024 1337   RDW 13.1 04/09/2021 0904   LYMPHSABS 2.6 11/23/2023 0944   LYMPHSABS 2.1 04/09/2021 0904   MONOABS 0.7 11/23/2023 0944   EOSABS 0.2 11/23/2023 0944   EOSABS 0.1 04/09/2021 0904   BASOSABS 0.1 11/23/2023 0944   BASOSABS 0.1 04/09/2021 0904    BMET    Component Value Date/Time   NA 139 01/30/2024 1337   NA 141 04/09/2021 0904   K 4.3 01/30/2024 1337   CL 106 01/30/2024 1337   CO2 23 01/30/2024 1337   GLUCOSE 84 01/30/2024 1337   BUN 18 01/30/2024 1337   BUN 13 04/09/2021 0904   CREATININE 1.48 (H) 01/30/2024 1337   CALCIUM  10.1 01/30/2024 1337   GFRNONAA 55 (L) 01/30/2024 1337   GFRAA 99 11/13/2019 1359    COAGS: Lab Results  Component Value Date   INR 2.0 (H) 11/18/2023   INR 1.0 09/21/2022   INR 1.0 12/25/2019     ASSESSMENT/PLAN: This is a 58 y.o. male with physical exam findings consistent with thrombosed right lower extremity bypass graft.  I had a nice conversation with him regarding the above.  He is aware that his life decisions are leading to poor bypass outcomes, most notably continued cocaine use and cigarettes.  He is aware that was after bypass, the procedure becomes more difficult, and more dangerous.  I think it is reasonable to offer angiography for ischemic rest pain in an effort to define if he has any targets available for repeat bypass.  If he does not, he understands that we will be discussing amputation.  Plan for lower extremity angiogram with  possible intervention tomorrow.  Can turn  off heparin  on-call to Cath Lab. Patient can eat tonight.  N.p.o. midnight please.   Fonda FORBES Rim MD [facility] [phone] 01/30/2024  6:37 PM

## 2024-01-30 NOTE — ED Notes (Signed)
 Patient transported to MRI

## 2024-01-30 NOTE — ED Notes (Signed)
 CCMD called.

## 2024-01-30 NOTE — ED Notes (Signed)
 Pt informed pt is on the way
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
## 2024-01-30 NOTE — Progress Notes (Signed)
 PHARMACY - ANTICOAGULATION CONSULT NOTE  Pharmacy Consult for heparin  Indication: Ischemic limb  No Known Allergies  Patient Measurements:    Vital Signs: Temp: 98.4 F (36.9 C) (12/01 1331) Temp Source: Oral (12/01 1331) BP: 102/84 (12/01 1331) Pulse Rate: 99 (12/01 1331)  Labs: Recent Labs    01/30/24 1337 01/30/24 1553  HGB 15.2  --   HCT 47.2  --   PLT 237  --   CREATININE 1.48*  --   TROPONINIHS 19* 33*    CrCl cannot be calculated (Unknown ideal weight.).   Medical History: Past Medical History:  Diagnosis Date   Abnormal stress test    Acute left ankle pain 03/23/2023   Anxiety    Asthma    as a child   Atypical chest pain 05/07/2017   Chest pain  Unstable angina 06/15/2023   Chronic bilateral low back pain with left-sided sciatica 01/11/2023   Chronic health problem 06/15/2023   Chronic pain syndrome 03/29/2023   Cocaine abuse (HCC) 06/16/2023   Coronary artery disease    DDD (degenerative disc disease), lumbar 11/08/2019   Formatting of this note might be different from the original. 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 There is mild dilatation of the ascending aorta, measuring 43 mm. There is mild dilatation of the aortic root, measuring 44 mm.      Encounter for medication monitoring 01/10/2023   Encounter for well adult exam with abnormal findings 04/08/2023   Essential hypertension    Heart failure with mid-range ejection fraction (HFmEF) (HCC) 06/17/2023   06/16/23 transthoracic echocardiogram: Left Ventricle: Left ventricular ejection fraction 45 to 50%. The left ventricle has mildly decreased function. No left ventricular hypertrophy. 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 [place]    Hyperlipidemia    Impaired mobility and ADLs 03/23/2023   Ischemic cardiomyopathy 12/17/2020   Lumbar radiculopathy 07/05/2023   Myocardial  infarction [facility]) 2017   NSTEMI (non-ST elevated myocardial infarction) (HCC) 11/28/2020   OAB (overactive bladder) 01/11/2023   PAD (peripheral artery disease) 09/21/2022   PVD (peripheral vascular disease) 09/21/2019   Right leg pain 03/23/2023   Shortness of breath    Sinus bradycardia 11/29/2019   Tobacco use disorder    Unhoused person 06/16/2023   Unstable angina (HCC) 06/15/2023     Assessment: 58 YOM presenting with foot pain, hx severe PAD s/p bypass prescribed Xarelto  PTA however non adherent.    Goal of Therapy:  Heparin  level 0.3-0.7 units/ml Monitor platelets by anticoagulation protocol: Yes   Plan:  Heparin  4500 units Iv x 1, and gtt at 1500 units/hr F/u 6 hour heparin  level F/u VVS eval and plan  Dorn Poot, PharmD, [facility] Clinical Pharmacist ED Pharmacist Phone # [phone] 01/30/2024 5:21 PM
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
## 2024-01-30 NOTE — ED Provider Triage Note (Signed)
 Emergency Medicine Provider Triage Evaluation Note  [name] , a 58 y.o. male  was evaluated in triage.  Pt complains of painful feet and chest pain.  Review of Systems  Positive: Painful feet.  Chest pain. Negative: Shortness of breath.  Fever.  Physical Exam  BP 102/84 (BP Location: Left Arm)   Pulse 99   Temp 98.4 F (36.9 C) (Oral)   Resp 18   SpO2 99%  Gen:   Awake, no distress   Resp:  Normal effort  MSK:   Moves extremities without difficulty  Other:    Medical Decision Making  Medically screening exam initiated at 2:02 PM.  Appropriate orders placed.  YEHOSHUA VITELLI was informed that the remainder of the evaluation will be completed by another provider, this initial triage assessment does not replace that evaluation, and the importance of remaining in the ED until their evaluation is complete.     Laurice Maude BROCKS, MD 01/30/24 [phone]
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
## 2024-01-31 ENCOUNTER — Encounter (HOSPITAL_COMMUNITY): Admission: EM | Disposition: A | Payer: Self-pay | Source: Home / Self Care

## 2024-01-31 DIAGNOSIS — Z79899 Other long term (current) drug therapy: Secondary | ICD-10-CM

## 2024-01-31 DIAGNOSIS — Z91148 Patient's other noncompliance with medication regimen for other reason: Secondary | ICD-10-CM

## 2024-01-31 DIAGNOSIS — F1910 Other psychoactive substance abuse, uncomplicated: Secondary | ICD-10-CM

## 2024-01-31 DIAGNOSIS — I25118 Atherosclerotic heart disease of native coronary artery with other forms of angina pectoris: Secondary | ICD-10-CM

## 2024-01-31 DIAGNOSIS — I214 Non-ST elevation (NSTEMI) myocardial infarction: Secondary | ICD-10-CM

## 2024-01-31 DIAGNOSIS — I739 Peripheral vascular disease, unspecified: Secondary | ICD-10-CM

## 2024-01-31 DIAGNOSIS — Z7901 Long term (current) use of anticoagulants: Secondary | ICD-10-CM

## 2024-01-31 DIAGNOSIS — I998 Other disorder of circulatory system: Secondary | ICD-10-CM

## 2024-01-31 DIAGNOSIS — Z95820 Peripheral vascular angioplasty status with implants and grafts: Secondary | ICD-10-CM

## 2024-01-31 DIAGNOSIS — Z59819 Housing instability, housed unspecified: Secondary | ICD-10-CM

## 2024-01-31 DIAGNOSIS — Z9889 Other specified postprocedural states: Secondary | ICD-10-CM

## 2024-01-31 DIAGNOSIS — I7143 Infrarenal abdominal aortic aneurysm, without rupture: Secondary | ICD-10-CM

## 2024-01-31 HISTORY — PX: ABDOMINAL AORTOGRAM W/LOWER EXTREMITY: CATH118223

## 2024-01-31 HISTORY — PX: PERIPHERAL VASCULAR THROMBECTOMY: CATH118306

## 2024-01-31 LAB — CBC
HCT: 41.2 % (ref 39.0–52.0)
HCT: 45.9 % (ref 39.0–52.0)
HCT: 40.9 % (ref 39.0–52.0)
Hemoglobin: 13.5 g/dL (ref 13.0–17.0)
Hemoglobin: 14.9 g/dL (ref 13.0–17.0)
Hemoglobin: 13.3 g/dL (ref 13.0–17.0)
MCH: 29.9 pg (ref 26.0–34.0)
MCH: 30.2 pg (ref 26.0–34.0)
MCH: 29.5 pg (ref 26.0–34.0)
MCHC: 32.8 g/dL (ref 30.0–36.0)
MCHC: 32.5 g/dL (ref 30.0–36.0)
MCHC: 32.5 g/dL (ref 30.0–36.0)
MCV: 91.2 fL (ref 80.0–100.0)
MCV: 92.9 fL (ref 80.0–100.0)
MCV: 90.7 fL (ref 80.0–100.0)
Platelets: 235 K/uL (ref 150–400)
Platelets: 210 K/uL (ref 150–400)
Platelets: 239 K/uL (ref 150–400)
RBC: 4.52 MIL/uL (ref 4.22–5.81)
RBC: 4.94 MIL/uL (ref 4.22–5.81)
RBC: 4.51 MIL/uL (ref 4.22–5.81)
RDW: 14.9 % (ref 11.5–15.5)
RDW: 15.1 % (ref 11.5–15.5)
RDW: 15 % (ref 11.5–15.5)
WBC: 5.6 K/uL (ref 4.0–10.5)
WBC: 6.5 K/uL (ref 4.0–10.5)
WBC: 6.4 K/uL (ref 4.0–10.5)
nRBC: 0 % (ref 0.0–0.2)
nRBC: 0 % (ref 0.0–0.2)
nRBC: 0 % (ref 0.0–0.2)

## 2024-01-31 LAB — FIBRINOGEN
Fibrinogen: 453 mg/dL (ref 210–475)
Fibrinogen: 432 mg/dL (ref 210–475)

## 2024-01-31 LAB — HEPARIN LEVEL (UNFRACTIONATED)
Heparin Unfractionated: 0.83 [IU]/mL — ABNORMAL HIGH (ref 0.30–0.70)
Heparin Unfractionated: >1.1 [IU]/mL — ABNORMAL HIGH (ref 0.30–0.70)
Heparin Unfractionated: 0.5 [IU]/mL (ref 0.30–0.70)

## 2024-01-31 LAB — BASIC METABOLIC PANEL WITH GFR
Anion gap: 6 (ref 5–15)
BUN: 18 mg/dL (ref 6–20)
CO2: 30 mmol/L (ref 22–32)
Calcium: 9.7 mg/dL (ref 8.9–10.3)
Chloride: 104 mmol/L (ref 98–111)
Creatinine, Ser: 1.39 mg/dL — ABNORMAL HIGH (ref 0.61–1.24)
GFR, Estimated: 59 mL/min — ABNORMAL LOW (ref >60)
Glucose, Bld: 114 mg/dL — ABNORMAL HIGH (ref 70–99)
Potassium: 3.7 mmol/L (ref 3.5–5.1)
Sodium: 140 mmol/L (ref 135–145)

## 2024-01-31 SURGERY — ABDOMINAL AORTOGRAM W/LOWER EXTREMITY
Anesthesia: LOCAL

## 2024-01-31 MED ORDER — LIDOCAINE HCL (PF) 1 % IJ SOLN
INTRAMUSCULAR | Status: DC | PRN
  Administered 2024-01-31: 15 mL via INTRADERMAL

## 2024-01-31 MED ORDER — SODIUM CHLORIDE 0.9 % IV SOLN
1.0000 mg/h | INTRAVENOUS | Status: DC
  Administered 2024-01-31 – 2024-02-01 (×2): 1 mg/h
  Filled 2024-01-31 (×6): qty 10

## 2024-01-31 MED ORDER — LIDOCAINE HCL (PF) 1 % IJ SOLN
INTRAMUSCULAR | Status: AC
  Filled 2024-01-31: qty 30

## 2024-01-31 MED ORDER — HEPARIN (PORCINE) IN NACL 1000-0.9 UT/500ML-% IV SOLN
INTRAVENOUS | Status: DC | PRN
  Administered 2024-01-31: 1000 mL

## 2024-01-31 MED ORDER — HEPARIN (PORCINE) 25000 UT/250ML-% IV SOLN
800.0000 [IU]/h | INTRAVENOUS | Status: DC
  Administered 2024-01-31: 800 [IU]/h via INTRAVENOUS

## 2024-01-31 MED ORDER — MIDAZOLAM HCL 2 MG/2ML IJ SOLN
INTRAMUSCULAR | Status: AC
  Filled 2024-01-31: qty 2

## 2024-01-31 MED ORDER — PANTOPRAZOLE SODIUM 40 MG PO TBEC
40.0000 mg | DELAYED_RELEASE_TABLET | Freq: Two times a day (BID) | ORAL | Status: DC
  Administered 2024-01-31 – 2024-02-04 (×9): 40 mg via ORAL
  Filled 2024-01-31 (×9): qty 1

## 2024-01-31 MED ORDER — FENTANYL CITRATE (PF) 100 MCG/2ML IJ SOLN
INTRAMUSCULAR | Status: AC
  Filled 2024-01-31: qty 2

## 2024-01-31 MED ORDER — POLYETHYLENE GLYCOL 3350 17 G PO PACK: 17.0000 g | PACK | Freq: Every day | ORAL | Status: DC | PRN

## 2024-01-31 MED ORDER — MIDAZOLAM HCL (PF) 2 MG/2ML IJ SOLN: 1.0000 mg | INTRAMUSCULAR | Status: DC | PRN

## 2024-01-31 MED ORDER — ACETAMINOPHEN 500 MG PO TABS
500.0000 mg | ORAL_TABLET | Freq: Four times a day (QID) | ORAL | Status: DC | PRN
  Administered 2024-02-03 – 2024-02-04 (×3): 500 mg via ORAL
  Filled 2024-01-31 (×3): qty 1

## 2024-01-31 MED ORDER — HYDRALAZINE HCL 20 MG/ML IJ SOLN
10.0000 mg | INTRAMUSCULAR | Status: DC | PRN
  Administered 2024-02-01 – 2024-02-02 (×3): 10 mg via INTRAVENOUS
  Filled 2024-01-31 (×3): qty 1

## 2024-01-31 MED ORDER — HEPARIN SODIUM (PORCINE) 1000 UNIT/ML IJ SOLN
INTRAMUSCULAR | Status: DC | PRN
  Administered 2024-01-31: 9000 [IU] via INTRAVENOUS

## 2024-01-31 MED ORDER — SODIUM CHLORIDE 0.9% FLUSH
3.0000 mL | Freq: Two times a day (BID) | INTRAVENOUS | Status: DC
  Administered 2024-01-31 – 2024-02-04 (×8): 3 mL via INTRAVENOUS

## 2024-01-31 MED ORDER — SODIUM CHLORIDE 0.9 % IV SOLN: 250.0000 mL | INTRAVENOUS | Status: AC | PRN

## 2024-01-31 MED ORDER — ATORVASTATIN CALCIUM 80 MG PO TABS
80.0000 mg | ORAL_TABLET | Freq: Every day | ORAL | Status: DC
  Administered 2024-01-31 – 2024-02-04 (×5): 80 mg via ORAL
  Filled 2024-01-31 (×5): qty 1

## 2024-01-31 MED ORDER — MIDAZOLAM HCL (PF) 2 MG/2ML IJ SOLN
INTRAMUSCULAR | Status: DC | PRN
  Administered 2024-01-31: 2 mg via INTRAVENOUS
  Administered 2024-01-31 (×2): 1 mg via INTRAVENOUS

## 2024-01-31 MED ORDER — SODIUM CHLORIDE 0.9 % IV SOLN: INTRAVENOUS | Status: DC

## 2024-01-31 MED ORDER — SODIUM CHLORIDE 0.9% FLUSH: 3.0000 mL | INTRAVENOUS | Status: DC | PRN

## 2024-01-31 MED ORDER — MORPHINE SULFATE (PF) 2 MG/ML IV SOLN
5.0000 mg | INTRAVENOUS | Status: DC | PRN
  Administered 2024-01-31: 5 mg via INTRAVENOUS
  Administered 2024-01-31: 2 mg via INTRAVENOUS
  Administered 2024-01-31 – 2024-02-01 (×10): 5 mg via INTRAVENOUS
  Filled 2024-01-31 (×11): qty 3

## 2024-01-31 MED ORDER — FENTANYL CITRATE (PF) 100 MCG/2ML IJ SOLN
INTRAMUSCULAR | Status: DC | PRN
  Administered 2024-01-31 (×2): 25 ug via INTRAVENOUS
  Administered 2024-01-31: 50 ug via INTRAVENOUS

## 2024-01-31 MED ORDER — SODIUM CHLORIDE 0.9 % IV SOLN: INTRAVENOUS | Status: AC

## 2024-01-31 MED ORDER — IODIXANOL 320 MG/ML IV SOLN
INTRAVENOUS | Status: DC | PRN
  Administered 2024-01-31: 90 mL via INTRA_ARTERIAL

## 2024-01-31 MED ORDER — HEPARIN SODIUM (PORCINE) 1000 UNIT/ML IJ SOLN
INTRAMUSCULAR | Status: AC
  Filled 2024-01-31: qty 10

## 2024-01-31 MED ORDER — MORPHINE SULFATE (PF) 2 MG/ML IV SOLN
INTRAVENOUS | Status: AC
  Filled 2024-01-31: qty 1

## 2024-01-31 MED ORDER — ONDANSETRON HCL 4 MG/2ML IJ SOLN
4.0000 mg | Freq: Four times a day (QID) | INTRAMUSCULAR | Status: DC | PRN
  Administered 2024-01-31 – 2024-02-01 (×5): 4 mg via INTRAVENOUS
  Filled 2024-01-31 (×4): qty 2

## 2024-01-31 SURGICAL SUPPLY — 16 items
CANISTER PENUMBRA ENGINE (MISCELLANEOUS) IMPLANT
CATH ANGIO 5F BER2 65CM (CATHETERS) IMPLANT
CATH EKOS ULTRASOUND 135X50 (CATHETERS) IMPLANT
CATH INDIGO CAT6 KIT (CATHETERS) IMPLANT
CATH OMNI FLUSH 5F 65CM (CATHETERS) IMPLANT
CATH QUICKCROSS .035X135CM (MICROCATHETER) IMPLANT
COVER DOME SNAP 22 D (MISCELLANEOUS) IMPLANT
GLIDEWIRE ADV .035X260CM (WIRE) IMPLANT
KIT MICROPUNCTURE NIT STIFF (SHEATH) IMPLANT
KIT SINGLE USE MANIFOLD (KITS) IMPLANT
PACK CARDIAC CATHETERIZATION (CUSTOM PROCEDURE TRAY) IMPLANT
SET ATX-X65L (MISCELLANEOUS) IMPLANT
SHEATH CATAPULT 6FR 45 (SHEATH) IMPLANT
SHEATH PINNACLE 5F 10CM (SHEATH) IMPLANT
SHEATH PROBE COVER 6X72 (BAG) IMPLANT
WIRE BENTSON .035X145CM (WIRE) IMPLANT

## 2024-01-31 NOTE — Consult Note (Signed)
 NAME:  Benjamin Espinoza, MRN:  [id number], DOB:  01/26/1966, LOS: 1 ADMISSION DATE:  01/30/2024,  CHIEF COMPLAINT:  Critical Limb Ischemia   History of Present Illness:  This 58 year old gentleman who has a history of significant coronary artery disease hypertension and peripheral arterial disease presents to vascular surgery team with right leg pain.  He had multiple failed bypass grafts in the past.  He was emergently taken to the OR and is found to have occluded right femoropopliteal bypass graft.  This was successfully crossed and mechanical thrombectomy was performed.  The graft still remained occluded therefore EKOS catheter was left for overnight thrombolytic therapy.  Plan to reevaluate tomorrow.  Vascular surgery is trying their best to salvage the limb.  If not an amputation is possible.  Patient is alert awake oriented x 3 denies nausea vomiting.  Pain is little better.  He confirms to smoking and being drugs.  He states that he has been adherent with his antiplatelet and anticoagulation therapy at home.  He is willing to quit smoking.  Pertinent  Medical History   Past Medical History:  Diagnosis Date   Abnormal stress test    Acute left ankle pain 03/23/2023   Anxiety    Asthma    as a child   Atypical chest pain 05/07/2017   Chest pain  Unstable angina 06/15/2023   Chronic bilateral low back pain with left-sided sciatica 01/11/2023   Chronic health problem 06/15/2023   Chronic pain syndrome 03/29/2023   Cocaine abuse (HCC) 06/16/2023   Coronary artery disease    DDD (degenerative disc disease), lumbar 11/08/2019   Formatting of this note might be different from the original. 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 There is mild dilatation of the ascending aorta, measuring 43 mm. There is mild dilatation of the aortic root, measuring 44 mm.      Encounter for medication monitoring  01/10/2023   Encounter for well adult exam with abnormal findings 04/08/2023   Essential hypertension    Heart failure with mid-range ejection fraction (HFmEF) (HCC) 06/17/2023   06/16/23 transthoracic echocardiogram: Left Ventricle: Left ventricular ejection fraction 45 to 50%. The left ventricle has mildly decreased function. No left ventricular hypertrophy. 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 [place]    Hyperlipidemia    Impaired mobility and ADLs 03/23/2023   Ischemic cardiomyopathy 12/17/2020   Lumbar radiculopathy 07/05/2023   Myocardial infarction [facility]) 2017   NSTEMI (non-ST elevated myocardial infarction) (HCC) 11/28/2020   OAB (overactive bladder) 01/11/2023   PAD (peripheral artery disease) 09/21/2022   PVD (peripheral vascular disease) 09/21/2019   Right leg pain 03/23/2023   Shortness of breath    Sinus bradycardia 11/29/2019   Tobacco use disorder    Unhoused person 06/16/2023   Unstable angina (HCC) 06/15/2023     Significant Hospital Events: Including procedures, antibiotic start and stop dates in addition to other pertinent events   01/31/2024 - Admitted to ICU post right lower extremity angiogram and thrombectomy.  Interim History / Subjective:  Postoperatively EKOS is running.  Has feeble pulses in the right lower extremity.  There is some bleeding near the catheter site but not actively oozing.  Objective    Blood pressure (!) 122/103, pulse 83, temperature 98.1 F (36.7 C), temperature source Oral, resp. rate 15, height 6' 1 (1.854 m), weight 104.6 kg, SpO2 96%.  Intake/Output Summary (Last 24 hours) at 01/31/2024 1721 Last data filed at 01/31/2024 1600 Gross per 24 hour  Intake 1506.31 ml  Output 625 ml  Net 881.31 ml   Filed Weights   01/31/24 0540  Weight: 104.6 kg    Examination: Physical exam: General: Alert and oriented x 3 HEENT: [place]/AT, eyes anicteric.  moist mucus membranes Neuro: Alert, awake following  commands moving all 4 extremities. Chest: Coarse breath sounds, no wheezes or rhonchi Heart: Regular rate and rhythm, no murmurs or gallops Abdomen: Soft, nontender, nondistended, bowel sounds present Extremities: Good dopplerable pulses in the left lower extremity.  Feeble dopplerable pulses in the posterior tibial and dorsalis pedis in the right lower extremity.  The access catheter in the left femoral artery has some bleeding.  Nothing obviously oozing right now.   Resolved problem list   Assessment and Plan  Critical limb ischemia right lower extremity CAD status post CABG Hypertension Tobacco use disorder Severe peripheral vascular disease History of congestive heart failure with midrange ejection fraction History of substance use disorder -cocaine CKD stage III creatinine at baseline 1.39  - EKOS catheter in place with catheter directed thrombolysis in the right lower extremity femoral bypass graft -Reevaluate with an angiogram tomorrow by vascular surgery - Monitor hemodynamics currently maintaining good blood pressure hydralazine  as needed is ordered keep SBP below 160 - Nicotine  patch in place - Patient at home on aspirin  Ranexa  and Xarelto  and he states he has been taking them.  Currently because of EKOS only Ranexa  is being continued. -Monitor renal functions urine output and electrolytes - Monitor hemoglobin   Labs   CBC: Recent Labs  Lab 01/30/24 1337 01/31/24 0201 01/31/24 1534  WBC 4.4 5.6 6.5  HGB 15.2 13.5 14.9  HCT 47.2 41.2 45.9  MCV 93.3 91.2 92.9  PLT 237 235 210    Basic Metabolic Panel: Recent Labs  Lab 01/30/24 1337 01/31/24 0201  NA 139 140  K 4.3 3.7  CL 106 104  CO2 23 30  GLUCOSE 84 114*  BUN 18 18  CREATININE 1.48* 1.39*  CALCIUM  10.1 9.7   GFR: Estimated Creatinine Clearance: 73.6 mL/min (A) (by C-G formula based on SCr of 1.39 mg/dL (H)). Recent Labs  Lab 01/30/24 1337 01/31/24 0201 01/31/24 1534  WBC 4.4 5.6 6.5     Liver Function Tests: No results for input(s): AST, ALT, ALKPHOS, BILITOT, PROT, ALBUMIN in the last 168 hours. No results for input(s): LIPASE, AMYLASE in the last 168 hours. No results for input(s): AMMONIA in the last 168 hours.  ABG    Component Value Date/Time   TCO2 26 11/06/2023 1339     Coagulation Profile: No results for input(s): INR, PROTIME in the last 168 hours.  Cardiac Enzymes: No results for input(s): CKTOTAL, CKMB, CKMBINDEX, TROPONINI in the last 168 hours.  HbA1C: Hgb A1c MFr Bld  Date/Time Value Ref Range Status  10/13/2023 06:49 PM 5.4 4.8 - 5.6 % Final    Comment:    (NOTE) Diagnosis of Diabetes The following HbA1c ranges recommended by the American Diabetes Association (ADA) may be used as an aid in the diagnosis of diabetes mellitus.  Hemoglobin             Suggested A1C NGSP%              Diagnosis  <5.7                   Non Diabetic  5.7-6.4  Pre-Diabetic  >6.4                   Diabetic  <7.0                   Glycemic control for                       adults with diabetes.    11/29/2020 02:03 AM 5.3 4.8 - 5.6 % Final    Comment:    (NOTE) Pre diabetes:          5.7%-6.4%  Diabetes:              >6.4%  Glycemic control for   <7.0% adults with diabetes     CBG: No results for input(s): GLUCAP in the last 168 hours.  Review of Systems:   All point review of systems is done which is negative finding as mentioned in HPI.  Past Medical History:  He,  has a past medical history of Abnormal stress test, Acute left ankle pain (03/23/2023), Anxiety, Asthma, Atypical chest pain (05/07/2017), Chest pain  Unstable angina (06/15/2023), Chronic bilateral low back pain with left-sided sciatica (01/11/2023), Chronic health problem (06/15/2023), Chronic pain syndrome (03/29/2023), Cocaine abuse (HCC) (06/16/2023), Coronary artery disease, DDD (degenerative disc disease), lumbar (11/08/2019),  Dilation of thoracic aorta (06/17/2023), Encounter for medication monitoring (01/10/2023), Encounter for well adult exam with abnormal findings (04/08/2023), Essential hypertension, Heart failure with mid-range ejection fraction (HFmEF) (HCC) (06/17/2023), CABG (x4 01/14/2016 [place] ), Hyperlipidemia, Impaired mobility and ADLs (03/23/2023), Ischemic cardiomyopathy (12/17/2020), Lumbar radiculopathy (07/05/2023), Myocardial infarction (HCC) (2017), NSTEMI (non-ST elevated myocardial infarction) (HCC) (11/28/2020), OAB (overactive bladder) (01/11/2023), PAD (peripheral artery disease) (09/21/2022), PVD (peripheral vascular disease) (09/21/2019), Right leg pain (03/23/2023), Shortness of breath, Sinus bradycardia (11/29/2019), Tobacco use disorder, Unhoused person (06/16/2023), and Unstable angina (HCC) (06/15/2023).   Surgical History:   Past Surgical History:  Procedure Laterality Date   ABDOMINAL AORTOGRAM W/LOWER EXTREMITY Bilateral 09/21/2019   Procedure: ABDOMINAL AORTOGRAM W/LOWER EXTREMITY;  Surgeon: Eliza Lonni RAMAN, MD;  Location: [facility] INVASIVE CV LAB;  Service: Cardiovascular;  Laterality: Bilateral;   ABDOMINAL AORTOGRAM W/LOWER EXTREMITY N/A 12/07/2019   Procedure: ABDOMINAL AORTOGRAM W/LOWER EXTREMITY;  Surgeon: Eliza Lonni RAMAN, MD;  Location: [facility] INVASIVE CV LAB;  Service: Cardiovascular;  Laterality: N/A;   ABDOMINAL AORTOGRAM W/LOWER EXTREMITY N/A 09/09/2022   Procedure: ABDOMINAL AORTOGRAM W/LOWER EXTREMITY;  Surgeon: Gretta Lonni PARAS, MD;  Location: [facility] INVASIVE CV LAB;  Service: Vascular;  Laterality: N/A;   CARDIAC CATHETERIZATION     COLONOSCOPY N/A 11/19/2023   Procedure: COLONOSCOPY;  Surgeon: Stacia Glendia BRAVO, MD;  Location: [facility] ENDOSCOPY;  Service: Gastroenterology;  Laterality: N/A;   CORONARY ARTERY BYPASS GRAFT     quadruple bypas 2017   CORONARY STENT INTERVENTION N/A 11/26/2019   Procedure: CORONARY STENT INTERVENTION;  Surgeon: Mady Lonni, MD;   Location: [facility] INVASIVE CV LAB;  Service: Cardiovascular;  Laterality: N/A;   ESOPHAGOGASTRODUODENOSCOPY N/A 11/19/2023   Procedure: EGD (ESOPHAGOGASTRODUODENOSCOPY);  Surgeon: Stacia Glendia BRAVO, MD;  Location: [facility] ENDOSCOPY;  Service: Gastroenterology;  Laterality: N/A;   FEMORAL-POPLITEAL BYPASS GRAFT Right 12/25/2019   Procedure: BYPASS GRAFT FEMORAL- BELOW KNEE POPLITEAL ARTERY RIGHT USING GORE PROPATEN VASCULAR GRAFT;  Surgeon: Eliza Lonni RAMAN, MD;  Location: [facility] OR;  Service: Vascular;  Laterality: Right;   FEMORAL-POPLITEAL BYPASS GRAFT Right 09/21/2022   Procedure: REDO RIGHT LOWER EXTREMITY BYPASS USING GORE PROPATEN 6mm REMOVALBLE RING GRAFT;  Surgeon: Eliza Lonni RAMAN,  MD;  Location: [facility] OR;  Service: Vascular;  Laterality: Right;   INTRAOPERATIVE ARTERIOGRAM Right 09/21/2022   Procedure: INTRA OPERATIVE ARTERIOGRAM;  Surgeon: Eliza Lonni RAMAN, MD;  Location: [facility] OR;  Service: Vascular;  Laterality: Right;   LEFT HEART CATH AND CORS/GRAFTS ANGIOGRAPHY N/A 11/26/2019   Procedure: LEFT HEART CATH AND CORS/GRAFTS ANGIOGRAPHY;  Surgeon: Mady Lonni, MD;  Location: [facility] INVASIVE CV LAB;  Service: Cardiovascular;  Laterality: N/A;   LEFT HEART CATH AND CORS/GRAFTS ANGIOGRAPHY N/A 12/01/2020   Procedure: LEFT HEART CATH AND CORS/GRAFTS ANGIOGRAPHY;  Surgeon: Burnard Debby LABOR, MD;  Location: [facility] INVASIVE CV LAB;  Service: Cardiovascular;  Laterality: N/A;   LEFT HEART CATH AND CORS/GRAFTS ANGIOGRAPHY N/A 06/16/2023   Procedure: LEFT HEART CATH AND CORS/GRAFTS ANGIOGRAPHY;  Surgeon: Jordan, Peter M, MD;  Location: [facility] INVASIVE CV LAB;  Service: Cardiovascular;  Laterality: N/A;   LOWER EXTREMITY ANGIOGRAM Right 12/25/2019   Procedure: RIGHT LOWER EXTREMITY ANGIOGRAM;  Surgeon: Eliza Lonni RAMAN, MD;  Location: [facility] OR;  Service: Vascular;  Laterality: Right;   PERIPHERAL VASCULAR BALLOON ANGIOPLASTY Left 12/07/2019   Procedure: PERIPHERAL VASCULAR BALLOON ANGIOPLASTY;  Surgeon:  Eliza Lonni RAMAN, MD;  Location: [facility] INVASIVE CV LAB;  Service: Cardiovascular;  Laterality: Left;  SFA     Social History:   reports that he has been smoking cigars and cigarettes. He has been exposed to tobacco smoke. He has never used smokeless tobacco. He reports current drug use. Drugs: Marijuana and Cocaine. He reports that he does not drink alcohol.   Family History:  His family history includes Heart disease in his father, mother, and sister.   Allergies No Known Allergies   Home Medications  Prior to Admission medications   Medication Sig Start Date End Date Taking? Authorizing Provider  amLODipine  (NORVASC ) 5 MG tablet Take 1 tablet (5 mg total) by mouth daily. 12/12/23 03/11/24 Yes Fountain, Madison L, NP  aspirin  325 MG tablet Take 325 mg by mouth daily.   Yes [provider]  atorvastatin  (LIPITOR ) 80 MG tablet Take 1 tablet (80 mg total) by mouth daily. 11/06/23  Yes Dean Clarity, MD  carvedilol  (COREG ) 6.25 MG tablet Take 1 tablet (6.25 mg total) by mouth 2 (two) times daily with a meal. 11/06/23  Yes Dean Clarity, MD  gabapentin  (NEURONTIN ) 300 MG capsule Take 1 capsule (300 mg total) by mouth 3 (three) times daily. Patient taking differently: Take 300 mg by mouth 3 (three) times daily as needed (neuropathy, pain). 11/06/23  Yes Dean Clarity, MD  isosorbide  mononitrate (IMDUR ) 120 MG 24 hr tablet Take 1 tablet (120 mg total) by mouth daily. Patient taking differently: Take 120 mg by mouth 2 (two) times daily. 11/06/23  Yes Dean Clarity, MD  losartan  (COZAAR ) 50 MG tablet Take 1 tablet (50 mg total) by mouth daily. 11/06/23  Yes Dean Clarity, MD  nicotine  (NICODERM CQ  - DOSED IN MG/24 HOURS) 14 mg/24hr patch Place 1 patch (14 mg total) onto the skin daily. 11/19/23  Yes [name], [name], DO  ranolazine  (RANEXA ) 1000 MG SR tablet Take 1 tablet (1,000 mg total) by mouth 2 (two) times daily. Patient taking differently: Take 1,000 mg by mouth daily. 11/06/23  Yes  Dean Clarity, MD  rivaroxaban  (XARELTO ) 20 MG TABS tablet Take 1 tablet (20 mg total) by mouth daily with supper. 12/12/23  Yes Fountain, Madison L, NP  cilostazol  (PLETAL ) 100 MG tablet Take 1 tablet (100 mg total) by mouth 2 (two) times daily. Patient not taking: Reported on  12/12/2023 11/06/23   Dean Clarity, MD  nicotine  polacrilex (NICORETTE ) 4 MG gum Take 1 each (4 mg total) by mouth as needed for smoking cessation. Patient not taking: Reported on 01/31/2024 12/01/23   Kennyth Domino, FNP  nitroGLYCERIN  (NITROSTAT ) 0.4 MG SL tablet Place 1 tablet (0.4 mg total) under the tongue every 5 (five) minutes as needed for chest pain. Patient not taking: Reported on 01/31/2024 12/12/23   Rana Lum CROME, NP     Critical care time: 34 Minutes     Tamela Stakes, MD  Attending Physician, Critical Care Medicine [facility] See Amion for pager If no response to pager, please call [phone] until 7pm After 7pm, Please call E-link [phone]

## 2024-01-31 NOTE — Progress Notes (Signed)
 PT Cancellation Note  Patient Details Name: CAYDN JUSTEN MRN: [id number] DOB: 12-10-1965   Cancelled Treatment:    Reason Eval/Treat Not Completed: Patient at procedure or test/unavailable (pt at Angiogram all morning and currently still off the floor. Will follow up as able and appropriate.)  [name], DPT, CLT  Acute Rehabilitation Services Office: [phone] (Secure chat preferred)   [name] 01/31/2024, 1:44 PM

## 2024-01-31 NOTE — Progress Notes (Signed)
 PHARMACY - ANTICOAGULATION CONSULT NOTE  Pharmacy Consult for heparin  Indication: Ischemic limb  No Known Allergies  Patient Measurements:    Vital Signs: Temp: 98.5 F (36.9 C) (12/01 2351) Temp Source: Oral (12/01 2351) BP: 132/91 (12/01 2351) Pulse Rate: 83 (12/01 2351)  Labs: Recent Labs    01/30/24 1337 01/30/24 1553 01/31/24 0201  HGB 15.2  --  13.5  HCT 47.2  --  41.2  PLT 237  --  235  HEPARINUNFRC  --   --  0.83*  CREATININE 1.48*  --  1.39*  TROPONINIHS 19* 33*  --     CrCl cannot be calculated (Unknown ideal weight.).   Medical History: Past Medical History:  Diagnosis Date   Abnormal stress test    Acute left ankle pain 03/23/2023   Anxiety    Asthma    as a child   Atypical chest pain 05/07/2017   Chest pain  Unstable angina 06/15/2023   Chronic bilateral low back pain with left-sided sciatica 01/11/2023   Chronic health problem 06/15/2023   Chronic pain syndrome 03/29/2023   Cocaine abuse (HCC) 06/16/2023   Coronary artery disease    DDD (degenerative disc disease), lumbar 11/08/2019   Formatting of this note might be different from the original. 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 There is mild dilatation of the ascending aorta, measuring 43 mm. There is mild dilatation of the aortic root, measuring 44 mm.      Encounter for medication monitoring 01/10/2023   Encounter for well adult exam with abnormal findings 04/08/2023   Essential hypertension    Heart failure with mid-range ejection fraction (HFmEF) (HCC) 06/17/2023   06/16/23 transthoracic echocardiogram: Left Ventricle: Left ventricular ejection fraction 45 to 50%. The left ventricle has mildly decreased function. No left ventricular hypertrophy. 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 [place]    Hyperlipidemia    Impaired mobility and ADLs 03/23/2023   Ischemic  cardiomyopathy 12/17/2020   Lumbar radiculopathy 07/05/2023   Myocardial infarction [facility]) 2017   NSTEMI (non-ST elevated myocardial infarction) (HCC) 11/28/2020   OAB (overactive bladder) 01/11/2023   PAD (peripheral artery disease) 09/21/2022   PVD (peripheral vascular disease) 09/21/2019   Right leg pain 03/23/2023   Shortness of breath    Sinus bradycardia 11/29/2019   Tobacco use disorder    Unhoused person 06/16/2023   Unstable angina (HCC) 06/15/2023     Assessment: Benjamin Espinoza presenting with foot pain, hx severe PAD s/p bypass prescribed Xarelto  PTA however non adherent.  12/2 AM update:  Heparin  level supra-therapeutic   Goal of Therapy:  Heparin  level 0.3-0.7 units/ml Monitor platelets by anticoagulation protocol: Yes   Plan:  Dec heparin  to 1350 units/hr Heparin  level in 6-8 hours  Lynwood Mckusick, PharmD, BCPS Clinical Pharmacist Phone: [phone]

## 2024-01-31 NOTE — Op Note (Signed)
 Patient name: Benjamin Espinoza MRN: [id number] DOB: 20-Mar-1965 Sex: male  01/31/2024 Pre-operative Diagnosis: Right leg wrist pain Post-operative diagnosis:  Same Surgeon:  Malvina New Procedure Performed:  1.  Ultrasound-guided access, left femoral artery  2.  Aortobifemoral angiogram  3.  Right leg angiogram  4.  Selective injection with catheter in the right extrailiac artery and right popliteal artery  5.  Mechanical thrombectomy of right femoral-popliteal bypass graft  6.  Placement of catheter for infusion of thrombolytic therapy  7.  Conscious sedation, 66 minutes   Indications: This is a 58 year old gentleman with right leg rest pain.  He has had multiple failed bypass grafts.  He is here today for angiogram and possible intervention.  Procedure:  The patient was identified in the holding area and taken to room 8.  The patient was then placed supine on the table and prepped and draped in the usual sterile fashion.  A time out was called.  Conscious sedation was administered with the use of IV fentanyl  and Versed  under continuous physician and nurse monitoring.  Heart rate, blood pressure, and oxygen saturation were continuously monitored.  Total sedation time was 66 minutes.  Ultrasound was used to evaluate the left common femoral artery.  It was patent .  A digital ultrasound image was acquired.  A micropuncture needle was used to access the left common femoral artery under ultrasound guidance.  An 018 wire was advanced without resistance and a micropuncture sheath was placed.  The 018 wire was removed and a benson wire was placed.  The micropuncture sheath was exchanged for a 5 french sheath.  An omniflush catheter was advanced over the wire to the level of L-1.  An abdominal angiogram was obtained.  Next, using the omniflush catheter and a benson wire, the aortic bifurcation was crossed and the catheter was placed into theright external iliac artery and right runoff was obtained.     Findings:   Aortogram: No significant renal artery stenosis.  The infrarenal abdominal aorta is widely patent.  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  Dimensions of the aneurysm cannot be determined from angiography.  Bilateral common and external iliac arteries are widely patent.  Bilateral common femorals are widely patent.  Right Lower Extremity: The right common femoral and profundofemoral artery are widely patent.  There is a stump of an occluded bypass graft.  There is reconstitution of the diseased below-knee popliteal artery and runoff via the posterior tibial and peroneal artery which were diffusely diseased.   Intervention: After the above images were acquired the decision was made to attempt intervention.  A 6 French 45 cm sheath was advanced in the right external iliac artery.  The patient was fully heparinized.  Heparin  levels were checked with ACT measurements.  Selective injections were performed with the cath in the right external iliac artery to better define the anatomy.  Using a Berenstein 2 catheter and a Glidewire advantage I was able to gain access into the bypass graft.  The wire and catheter went down fairly easily.  I was able to reenter into the below-knee popliteal artery.  This was confirmed with a selective injection in the below-knee popliteal artery.  I then replaced the Glidewire advantage and remove the catheter.  A CAT 6 penumbra thrombectomy device was then inserted and mechanical thrombectomy of the bypass graft was performed.  2 passes down and back were performed.  There was a decent amount of thrombus  evacuated.  I then performed completion imaging.  The bypass graft was not patent at this time.  I contemplated aligning this with Viabahn stents however I elected to run him on tPA overnight.  He will be brought back tomorrow for follow-up study.  He does have a history of a GI bleed several months ago.  Impression:  #1  Occluded right femoral-popliteal bypass graft, successfully  crossed.  Mechanical thrombectomy was performed but the graft remains occluded and so a EKOS catheter was left for overnight thrombolytic therapy  #2  Patient be brought back tomorrow for follow-up study.   ALONSO Malvina New, M.D., [facility] [place] Office: [phone] Pager:  [phone]

## 2024-01-31 NOTE — Progress Notes (Signed)
 SUBJECTIVE:  Patient Summary: Benjamin Espinoza is a 58 y.o. with a pertinent PMHx of HTN, HLD, PVD, CVA 09/2023 L punctate, CAD (CABG 2017), GERD, cocaine use disorder, and tobacco use disorder who presented with RLE pain for 1 month and chest pain and admitted for RLE ischemia.   Overnight Events and Interim History: Vascular Surgery consulted, patient well known to their service s/p two distal bypasses in RLE, most recent 2024, and lost to follow up. Non palpable pulses in right foot. Plan to precede with lower extremity angiogram with possible intervention today.   OBJECTIVE:  Vital Signs: Vitals:   01/30/24 2108 01/30/24 2351 01/31/24 0434 01/31/24 0540  BP: 106/82 (!) 132/91 120/86   Pulse:  83    Resp: 12     Temp:  98.5 F (36.9 C) 98.5 F (36.9 C)   TempSrc:  Oral Oral   SpO2: 98% 97%    Weight:    104.6 kg  Height:    6' 1 (1.854 m)      Latest Ref Rng & Units 01/31/2024    2:01 AM 01/30/2024    1:37 PM 11/19/2023    7:42 AM  BMP  Glucose 70 - 99 mg/dL 885  84  96   BUN 6 - 20 mg/dL 18  18  7    Creatinine 0.61 - 1.24 mg/dL 8.60  8.51  8.72   Sodium 135 - 145 mmol/L 140  139  138   Potassium 3.5 - 5.1 mmol/L 3.7  4.3  4.0   Chloride 98 - 111 mmol/L 104  106  105   CO2 22 - 32 mmol/L 30  23  24    Calcium  8.9 - 10.3 mg/dL 9.7  89.8  9.5       Latest Ref Rng & Units 01/31/2024    2:01 AM 01/30/2024    1:37 PM 11/23/2023    9:44 AM  CBC  WBC 4.0 - 10.5 K/uL 5.6  4.4  8.5   Hemoglobin 13.0 - 17.0 g/dL 86.4  84.7  89.3   Hematocrit 39.0 - 52.0 % 41.2  47.2  31.1   Platelets 150 - 400 K/uL 235  237  373.0     Supplemental O2: Room Air SpO2: 97 %  Filed Weights   01/31/24 0540  Weight: 104.6 kg     Intake/Output Summary (Last 24 hours) at 01/31/2024 0706 Last data filed at 01/31/2024 0600 Gross per 24 hour  Intake 858.35 ml  Output 425 ml  Net 433.35 ml   Net IO Since Admission: 433.35 mL [01/31/24 0706]  Physical Exam: Physical Exam Constitutional:       General: He is not in acute distress.    Appearance: Normal appearance. He is not ill-appearing.  HENT:     Head: Normocephalic and atraumatic.  Cardiovascular:     Rate and Rhythm: Normal rate and regular rhythm.     Pulses:          Dorsalis pedis pulses are 0 on the right side and 2+ on the left side.  Pulmonary:     Effort: Pulmonary effort is normal.     Breath sounds: Normal breath sounds.  Abdominal:     General: Bowel sounds are normal.     Palpations: Abdomen is soft.  Feet:     Right foot:     Skin integrity: Ulcer and skin breakdown present.  Skin:    General: Skin is warm and dry.  Neurological:  General: No focal deficit present.     Mental Status: He is alert and oriented to person, place, and time.    Patient Lines/Drains/Airways Status     Active Line/Drains/Airways     Name Placement date Placement time Site Days   Peripheral IV 01/30/24 20 G Anterior;Proximal;Right Forearm 01/30/24  1711  Forearm  1           Peritnent Imaging:  CTA 12/1: Thrombosis of the right superficial femoral artery and 2 bypass grafts. There is a small amount of flow seen within a small native popliteal artery. Two vessel runoff to the right foot. Multiple areas of mild-to-moderate focal stenosis throughout the left superficial femoral artery and popliteal artery. Single vessel runoff to the left foot. Infrarenal abdominal aortic aneurysm measuring 3.2 cm.  ASSESSMENT/PLAN:  Assessment: Principal Problem:   Critical limb ischemia of right lower extremity (HCC) Active Problems:   Atypical chest pain   Tobacco use disorder   Shortness of breath   PAD (peripheral artery disease)   Right leg pain   Cocaine abuse (HCC)   Unhoused person   Plan: #PVD #RLE Limb Ischemia #RLE bypass graft thromboses Two previous distal bypasses in RLE, last in 2024. Not adherent to Xarelto . Continues to use tobacco and cocaine. Per vascular, presentation consistent with thrombosed  bypass grafts in RLE - Consults: Vascular Surgery, appreciate recommendations - Abdominal aortogram w/ lower extremity planned for today - NPO - Heparin  on until going down to cath lab - Pain control: Oxycodone  IR 5 mg q4hr PRN - Fluids: 0.9% NS - AM Labs: CBC - Fall precautions - PT/OT consult  #Chest Pain #NSTEMI #Chronic Angina  No longer endorsing chest pain or SOB, says last time he had chest pain was previous night. Lidocaine  patch ordered last night. Well's criteria 3, so CTA was deferred. Has chronic chest pain on nitroglycerin , but has been unable to fill for several weeks. Prior [facility] 05/2023 showed severe three-vessel CAD with multiple patent bypass grafts, though LAD distal to the LIMA graft is occluded, the SVG?RCA graft is chronically occluded, and the proximal SVG has an 80% stenosis. Poor candidate for PCI given noncompliance and PSUD - Recheck EKG and troponin for recurrent chest pain - Continue Lidocaine  Patch - Continue Ranexa  - Resume pantoprazole  40 mg s/p procedure - Consider cardiology consult with worsening chest pain or emerging changes on EKG, troponins - Continue to monitor  #Polysubstance Use Disorder #Housing Instability Cocaine, Tobacco, and THC use disorder. UDS positive - Consults: Social Work, appreciate recommendations  #Polypharmacy Patient states he gets easily confused with his multiple prescriptions, pharmacy made aware and will be meeting with the patient after his procedure - Follow up with pharmacy   #Aneurysmal dilatation of intrarenal abdominal aorta  CTA shows 3.0 x 3.2 cm, which is slightly increased in size compared to prior examination - Follow outpatient, will need repeat scan in 3 years.   #Chronic Medical Conditions HTN: Hold home Amlodipine  5 mg daily, Coreg  6.25 mg twice daily, Imdur  120 mg daily, Losartan  50 mg daily. Consider restarting s/p procedure  HLD: Hold home Atorvastatin  80 mg daily GERD: Continue home pantoprazole , restart  after surgery Hx CVA: Hold home Xarelto  and ASA. VTE proph with Heparin   Best Practice: Diet: Regular diet IVF: Fluids: 0.9NS VTE: Heparin , will be held for surgery Code: Full AB: No Pain Medicine: Oxycodone  IR 5 mg q4hr PRN Bowel Regimen: Miralax  PRN Therapy Recs: Pending DISPO: Pending Inaccessible home environment.  Signature: Alan Maiden, MD  PGY-1 [facility]  Pager: # [phone]. 7:06 AM, 01/31/2024

## 2024-01-31 NOTE — Plan of Care (Signed)
  Problem: Clinical Measurements: Goal: Ability to maintain clinical measurements within normal limits will improve Outcome: Progressing   Problem: Coping: Goal: Level of anxiety will decrease Outcome: Progressing   Problem: Elimination: Goal: Will not experience complications related to urinary retention Outcome: Progressing   Problem: Pain Managment: Goal: General experience of comfort will improve and/or be controlled Outcome: Progressing   Problem: Safety: Goal: Ability to remain free from injury will improve Outcome: Progressing   Problem: Skin Integrity: Goal: Risk for impaired skin integrity will decrease Outcome: Progressing

## 2024-01-31 NOTE — Progress Notes (Signed)
 PHARMACY - ANTICOAGULATION CONSULT NOTE  Pharmacy Consult for heparin  Indication: Ischemic limb  No Known Allergies  Patient Measurements: Height: 6' 1 (185.4 cm) Weight: 84.3 kg (185 lb 13.6 oz) IBW/kg (Calculated) : 79.9 HEPARIN  DW (KG): 101.3  Vital Signs: Temp: 97.8 F (36.6 C) (12/02 2015) Temp Source: Axillary (12/02 2015) BP: 146/96 (12/02 1900) Pulse Rate: 78 (12/02 2015)  Labs: Recent Labs    01/30/24 1337 01/30/24 1553 01/31/24 0201 01/31/24 1534 01/31/24 1950  HGB 15.2  --  13.5 14.9 13.3  HCT 47.2  --  41.2 45.9 40.9  PLT 237  --  235 210 239  HEPARINUNFRC  --   --  0.83* >1.10* 0.50  CREATININE 1.48*  --  1.39*  --   --   TROPONINIHS 19* 33*  --   --   --     Estimated Creatinine Clearance: 65.5 mL/min (A) (by C-G formula based on SCr of 1.39 mg/dL (H)).   Medical History: Past Medical History:  Diagnosis Date   Abnormal stress test    Acute left ankle pain 03/23/2023   Anxiety    Asthma    as a child   Atypical chest pain 05/07/2017   Chest pain  Unstable angina 06/15/2023   Chronic bilateral low back pain with left-sided sciatica 01/11/2023   Chronic health problem 06/15/2023   Chronic pain syndrome 03/29/2023   Cocaine abuse (HCC) 06/16/2023   Coronary artery disease    DDD (degenerative disc disease), lumbar 11/08/2019   Formatting of this note might be different from the original. 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 There is mild dilatation of the ascending aorta, measuring 43 mm. There is mild dilatation of the aortic root, measuring 44 mm.      Encounter for medication monitoring 01/10/2023   Encounter for well adult exam with abnormal findings 04/08/2023   Essential hypertension    Heart failure with mid-range ejection fraction (HFmEF) (HCC) 06/17/2023   06/16/23 transthoracic echocardiogram: Left Ventricle: Left ventricular ejection fraction 45 to  50%. The left ventricle has mildly decreased function. No left ventricular hypertrophy. 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 [place]    Hyperlipidemia    Impaired mobility and ADLs 03/23/2023   Ischemic cardiomyopathy 12/17/2020   Lumbar radiculopathy 07/05/2023   Myocardial infarction [facility]) 2017   NSTEMI (non-ST elevated myocardial infarction) (HCC) 11/28/2020   OAB (overactive bladder) 01/11/2023   PAD (peripheral artery disease) 09/21/2022   PVD (peripheral vascular disease) 09/21/2019   Right leg pain 03/23/2023   Shortness of breath    Sinus bradycardia 11/29/2019   Tobacco use disorder    Unhoused person 06/16/2023   Unstable angina (HCC) 06/15/2023     Assessment: 58 YOM presenting with foot pain, hx severe PAD s/p bypass prescribed Xarelto  PTA however non adherent.  12/2 PM update: now s/p mechanical thrombectomy of right femoral-popliteal bypass graft with alteplase infusion. Heparin  infusion now at 800 units/hr, pharmacy to manage with goal levels 0.2-0.5. Received 9000 units heparin  in cath lab so this may still be affecting level.  Goal of Therapy:  Heparin  level 0.2-0.5 units/ml Monitor platelets by anticoagulation protocol: Yes   Plan:  Continue heparin  at 800 units/hr Heparin  level in 6 hours  Rocky Slade, PharmD, BCPS Clinical Pharmacist  Please check AMION for all [facility] Pharmacy phone numbers After 10:00 PM, call Main Pharmacy [phone]

## 2024-01-31 NOTE — Plan of Care (Signed)
 Entered room to find patient with both legs bent and sitting up. Discussed risks and complications of bending legs. Patient states that he understands, and will try to keep both legs straight.    Problem: Education: Goal: Knowledge of General Education information will improve Description: Including pain rating scale, medication(s)/side effects and non-pharmacologic comfort measures Outcome: Progressing   Problem: Health Behavior/Discharge Planning: Goal: Ability to manage health-related needs will improve Outcome: Progressing   Problem: Clinical Measurements: Goal: Ability to maintain clinical measurements within normal limits will improve Outcome: Progressing Goal: Will remain free from infection Outcome: Progressing Goal: Diagnostic test results will improve Outcome: Progressing Goal: Respiratory complications will improve Outcome: Progressing Goal: Cardiovascular complication will be avoided Outcome: Progressing   Problem: Activity: Goal: Risk for activity intolerance will decrease Outcome: Progressing   Problem: Nutrition: Goal: Adequate nutrition will be maintained Outcome: Progressing   Problem: Coping: Goal: Level of anxiety will decrease Outcome: Progressing   Problem: Elimination: Goal: Will not experience complications related to bowel motility Outcome: Progressing Goal: Will not experience complications related to urinary retention Outcome: Progressing   Problem: Pain Managment: Goal: General experience of comfort will improve and/or be controlled Outcome: Progressing   Problem: Safety: Goal: Ability to remain free from injury will improve Outcome: Progressing   Problem: Skin Integrity: Goal: Risk for impaired skin integrity will decrease Outcome: Progressing

## 2024-01-31 NOTE — Progress Notes (Signed)
  Daily Progress Note   Subjective: Continues to have right lower extremity rest pain.   Objective: Vitals:   01/31/24 0817 01/31/24 0828  BP: 120/84   Pulse: 82 78  Resp: 15 16  Temp: 98.1 F (36.7 C)   SpO2: 97% 100%    Physical Examination Palpable femoral pulses, no pulse right foot, palpable DP on the left Nonlabored breathing Regular rate Relatively comfortable  ASSESSMENT/PLAN:  Patient is a 58 year old male with history of multiple bypasses to the right lower extremity most recent femoral to the below-knee popliteal artery.  He was lost to follow-up.  Has been noncompliant with medications.  Continues to use tobacco as well as cocaine.  Plan for right lower extremity angiogram today in an effort to assess for targets.  He is aware that if no target is found, the only option would be amputation or multimodal pain control.   Fonda FORBES Rim MD [facility] [phone] 01/31/2024  9:12 AM

## 2024-01-31 NOTE — H&P (View-Only) (Signed)
  Daily Progress Note   Subjective: Continues to have right lower extremity rest pain.   Objective: Vitals:   01/31/24 0817 01/31/24 0828  BP: 120/84   Pulse: 82 78  Resp: 15 16  Temp: 98.1 F (36.7 C)   SpO2: 97% 100%    Physical Examination Palpable femoral pulses, no pulse right foot, palpable DP on the left Nonlabored breathing Regular rate Relatively comfortable  ASSESSMENT/PLAN:  Patient is a 58 year old male with history of multiple bypasses to the right lower extremity most recent femoral to the below-knee popliteal artery.  He was lost to follow-up.  Has been noncompliant with medications.  Continues to use tobacco as well as cocaine.  Plan for right lower extremity angiogram today in an effort to assess for targets.  He is aware that if no target is found, the only option would be amputation or multimodal pain control.   Fonda FORBES Rim MD [facility] [phone] 01/31/2024  9:12 AM

## 2024-01-31 NOTE — Interval H&P Note (Signed)
 History and Physical Interval Note:  01/31/2024 11:39 AM  Benjamin Espinoza  has presented today for surgery, with the diagnosis of Rt LE critical limb ischemia.  The various methods of treatment have been discussed with the patient and family. After consideration of risks, benefits and other options for treatment, the patient has consented to  Procedure(s): ABDOMINAL AORTOGRAM W/LOWER EXTREMITY (N/A) as a surgical intervention.  The patient's history has been reviewed, patient examined, no change in status, stable for surgery.  I have reviewed the patient's chart and labs.  Questions were answered to the patient's satisfaction.     Malvina New

## 2024-02-01 ENCOUNTER — Encounter (HOSPITAL_COMMUNITY): Admission: EM | Disposition: A | Discharge status: Home / Self Care | Payer: Self-pay | Source: Home / Self Care

## 2024-02-01 ENCOUNTER — Encounter (HOSPITAL_COMMUNITY): Payer: Self-pay | Admitting: Surgery

## 2024-02-01 HISTORY — PX: PERIPHERAL VASCULAR THROMBECTOMY: CATH118306

## 2024-02-01 HISTORY — PX: LOWER EXTREMITY INTERVENTION: CATH118252

## 2024-02-01 LAB — CBC
HCT: 39.5 % (ref 39.0–52.0)
HCT: 39.9 % (ref 39.0–52.0)
HCT: 40.2 % (ref 39.0–52.0)
Hemoglobin: 12.7 g/dL — ABNORMAL LOW (ref 13.0–17.0)
Hemoglobin: 13 g/dL (ref 13.0–17.0)
Hemoglobin: 13.1 g/dL (ref 13.0–17.0)
MCH: 29.4 pg (ref 26.0–34.0)
MCH: 29.6 pg (ref 26.0–34.0)
MCH: 29.6 pg (ref 26.0–34.0)
MCHC: 32.2 g/dL (ref 30.0–36.0)
MCHC: 32.6 g/dL (ref 30.0–36.0)
MCHC: 32.6 g/dL (ref 30.0–36.0)
MCV: 91.4 fL (ref 80.0–100.0)
MCV: 90.9 fL (ref 80.0–100.0)
MCV: 91 fL (ref 80.0–100.0)
Platelets: 238 K/uL (ref 150–400)
Platelets: 212 K/uL (ref 150–400)
Platelets: 211 K/uL (ref 150–400)
RBC: 4.32 MIL/uL (ref 4.22–5.81)
RBC: 4.39 MIL/uL (ref 4.22–5.81)
RBC: 4.42 MIL/uL (ref 4.22–5.81)
RDW: 14.7 % (ref 11.5–15.5)
RDW: 14.8 % (ref 11.5–15.5)
RDW: 14.6 % (ref 11.5–15.5)
WBC: 6.1 K/uL (ref 4.0–10.5)
WBC: 6.9 K/uL (ref 4.0–10.5)
WBC: 7.3 K/uL (ref 4.0–10.5)
nRBC: 0 % (ref 0.0–0.2)
nRBC: 0 % (ref 0.0–0.2)
nRBC: 0 % (ref 0.0–0.2)

## 2024-02-01 LAB — BASIC METABOLIC PANEL WITH GFR
Anion gap: 9 (ref 5–15)
BUN: 10 mg/dL (ref 6–20)
CO2: 24 mmol/L (ref 22–32)
Calcium: 9.5 mg/dL (ref 8.9–10.3)
Chloride: 106 mmol/L (ref 98–111)
Creatinine, Ser: 1.08 mg/dL (ref 0.61–1.24)
GFR, Estimated: >60 mL/min (ref >60)
Glucose, Bld: 107 mg/dL — ABNORMAL HIGH (ref 70–99)
Potassium: 3.7 mmol/L (ref 3.5–5.1)
Sodium: 139 mmol/L (ref 135–145)

## 2024-02-01 LAB — HEPARIN LEVEL (UNFRACTIONATED)
Heparin Unfractionated: 0.21 [IU]/mL — ABNORMAL LOW (ref 0.30–0.70)
Heparin Unfractionated: 0.25 [IU]/mL — ABNORMAL LOW (ref 0.30–0.70)
Heparin Unfractionated: 0.18 [IU]/mL — ABNORMAL LOW (ref 0.30–0.70)

## 2024-02-01 LAB — FIBRINOGEN
Fibrinogen: 405 mg/dL (ref 210–475)
Fibrinogen: 433 mg/dL (ref 210–475)

## 2024-02-01 LAB — POCT ACTIVATED CLOTTING TIME: Activated Clotting Time: 261 s

## 2024-02-01 SURGERY — PERIPHERAL VASCULAR THROMBECTOMY
Anesthesia: LOCAL | Laterality: Right

## 2024-02-01 MED ORDER — PROTAMINE SULFATE 10 MG/ML IV SOLN
50.0000 mg | INTRAVENOUS | Status: AC
  Administered 2024-02-01: 50 mg via INTRAVENOUS
  Filled 2024-02-01: qty 5

## 2024-02-01 MED ORDER — HYDROMORPHONE HCL 1 MG/ML IJ SOLN
0.5000 mg | Freq: Once | INTRAMUSCULAR | Status: AC
  Administered 2024-02-01: 0.5 mg via INTRAVENOUS
  Filled 2024-02-01: qty 0.5

## 2024-02-01 MED ORDER — HEPARIN SODIUM (PORCINE) 1000 UNIT/ML IJ SOLN
INTRAMUSCULAR | Status: DC | PRN
  Administered 2024-02-01: 5000 [IU] via INTRAVENOUS

## 2024-02-01 MED ORDER — MIDAZOLAM HCL 2 MG/2ML IJ SOLN
INTRAMUSCULAR | Status: AC
  Filled 2024-02-01: qty 2

## 2024-02-01 MED ORDER — IODIXANOL 320 MG/ML IV SOLN
INTRAVENOUS | Status: DC | PRN
  Administered 2024-02-01: 65 mL

## 2024-02-01 MED ORDER — CHLORHEXIDINE GLUCONATE CLOTH 2 % EX PADS
6.0000 | MEDICATED_PAD | Freq: Every day | CUTANEOUS | Status: DC
  Administered 2024-02-02 – 2024-02-03 (×2): 6 via TOPICAL

## 2024-02-01 MED ORDER — HEPARIN (PORCINE) 25000 UT/250ML-% IV SOLN
800.0000 [IU]/h | INTRAVENOUS | Status: AC
  Administered 2024-02-01: 800 [IU]/h via INTRAVENOUS
  Filled 2024-02-01: qty 250

## 2024-02-01 MED ORDER — HYDROXYZINE HCL 25 MG PO TABS
25.0000 mg | ORAL_TABLET | Freq: Three times a day (TID) | ORAL | Status: DC | PRN
  Administered 2024-02-01 – 2024-02-03 (×2): 25 mg via ORAL
  Filled 2024-02-01 (×2): qty 1

## 2024-02-01 MED ORDER — LIDOCAINE HCL (PF) 1 % IJ SOLN
INTRAMUSCULAR | Status: DC | PRN
  Administered 2024-02-01: 10 mL

## 2024-02-01 MED ORDER — ASPIRIN 81 MG PO CHEW
CHEWABLE_TABLET | ORAL | Status: DC | PRN
  Administered 2024-02-01: 81 mg via ORAL

## 2024-02-01 MED ORDER — LIDOCAINE HCL (PF) 1 % IJ SOLN
INTRAMUSCULAR | Status: AC
  Filled 2024-02-01: qty 30

## 2024-02-01 MED ORDER — FENTANYL CITRATE (PF) 100 MCG/2ML IJ SOLN
INTRAMUSCULAR | Status: DC | PRN
  Administered 2024-02-01 (×2): 50 ug via INTRAVENOUS

## 2024-02-01 MED ORDER — ONDANSETRON HCL 4 MG/2ML IJ SOLN
INTRAMUSCULAR | Status: AC
  Filled 2024-02-01: qty 2

## 2024-02-01 MED ORDER — HEPARIN SODIUM (PORCINE) 1000 UNIT/ML IJ SOLN
INTRAMUSCULAR | Status: AC
  Filled 2024-02-01: qty 10

## 2024-02-01 MED ORDER — CLOPIDOGREL BISULFATE 300 MG PO TABS
ORAL_TABLET | ORAL | Status: DC | PRN
  Administered 2024-02-01: 300 mg via ORAL

## 2024-02-01 MED ORDER — FENTANYL CITRATE (PF) 100 MCG/2ML IJ SOLN
INTRAMUSCULAR | Status: AC
  Filled 2024-02-01: qty 2

## 2024-02-01 MED ORDER — MIDAZOLAM HCL (PF) 2 MG/2ML IJ SOLN
INTRAMUSCULAR | Status: DC | PRN
  Administered 2024-02-01: 2 mg via INTRAVENOUS

## 2024-02-01 SURGICAL SUPPLY — 11 items
BALLOON STERLING OTW 3X60X150 (BALLOONS) IMPLANT
CLOSURE PERCLOSE PROSTYLE (Vascular Products) IMPLANT
KIT ENCORE 26 ADVANTAGE (KITS) IMPLANT
KIT MICROPUNCTURE NIT STIFF (SHEATH) IMPLANT
KIT SINGLE USE MANIFOLD (KITS) IMPLANT
SET ATX-X65L (MISCELLANEOUS) IMPLANT
STENT VIABAHN 6X150X120 (Permanent Stent) IMPLANT
STENT VIABAHN 6X250X120 (Permanent Stent) IMPLANT
TRAY PV CATH (CUSTOM PROCEDURE TRAY) ×2 IMPLANT
WIRE BENTSON .035X145CM (WIRE) IMPLANT
WIRE G V18X300CM (WIRE) IMPLANT

## 2024-02-01 NOTE — Progress Notes (Signed)
 eLink Physician-Brief Progress Note Patient Name: Benjamin Espinoza DOB: 1965-11-07 MRN: [id number]   Date of Service  02/01/2024  HPI/Events of Note  S/P Recanalization of the right femoral to below-knee popliteal artery bypass  Having some itching post-procedure  eICU Interventions  Hydroxyzine PRN   0646 - magnesium   Intervention Category Minor Interventions: Routine modifications to care plan (e.g. PRN medications for pain, fever)  Azora Bonzo 02/01/2024, 8:38 PM

## 2024-02-01 NOTE — Progress Notes (Signed)
 Doing well postoperatively No hematoma DP PT signal   Please restart heparin  at 8 PM.  No bolus, therapeutic level. Plan for DC tomorrow pending improvement.

## 2024-02-01 NOTE — Progress Notes (Signed)
 PHARMACY - ANTICOAGULATION CONSULT NOTE  Pharmacy Consult for heparin  Indication: Ischemic limb  No Known Allergies  Patient Measurements: Height: 6' 1 (185.4 cm) Weight: 84.3 kg (185 lb 13.6 oz) IBW/kg (Calculated) : 79.9 HEPARIN  DW (KG): 101.3  Vital Signs: Temp: 98.6 F (37 C) (12/03 0831) Temp Source: Oral (12/03 0831) BP: 143/96 (12/03 1549) Pulse Rate: 96 (12/03 1549)  Labs: Recent Labs    01/30/24 1337 01/30/24 1553 01/31/24 0201 01/31/24 1534 02/01/24 0226 02/01/24 0648 02/01/24 0754  HGB 15.2  --  13.5   < > 12.7* 13.0 13.1  HCT 47.2  --  41.2   < > 39.5 39.9 40.2  PLT 237  --  235   < > 238 212 211  HEPARINUNFRC  --   --  0.83*   < > 0.21* 0.25* 0.18*  CREATININE 1.48*  --  1.39*  --   --  1.08  --   TROPONINIHS 19* 33*  --   --   --   --   --    < > = values in this interval not displayed.    Estimated Creatinine Clearance: 84.3 mL/min (by C-G formula based on SCr of 1.08 mg/dL).   Medical History: Past Medical History:  Diagnosis Date   Abnormal stress test    Acute left ankle pain 03/23/2023   Anxiety    Asthma    as a child   Atypical chest pain 05/07/2017   Chest pain  Unstable angina 06/15/2023   Chronic bilateral low back pain with left-sided sciatica 01/11/2023   Chronic health problem 06/15/2023   Chronic pain syndrome 03/29/2023   Cocaine abuse (HCC) 06/16/2023   Coronary artery disease    DDD (degenerative disc disease), lumbar 11/08/2019   Formatting of this note might be different from the original. 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 There is mild dilatation of the ascending aorta, measuring 43 mm. There is mild dilatation of the aortic root, measuring 44 mm.      Encounter for medication monitoring 01/10/2023   Encounter for well adult exam with abnormal findings 04/08/2023   Essential hypertension    Heart failure with mid-range ejection  fraction (HFmEF) (HCC) 06/17/2023   06/16/23 transthoracic echocardiogram: Left Ventricle: Left ventricular ejection fraction 45 to 50%. The left ventricle has mildly decreased function. No left ventricular hypertrophy. 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 [place]    Hyperlipidemia    Impaired mobility and ADLs 03/23/2023   Ischemic cardiomyopathy 12/17/2020   Lumbar radiculopathy 07/05/2023   Myocardial infarction [facility]) 2017   NSTEMI (non-ST elevated myocardial infarction) (HCC) 11/28/2020   OAB (overactive bladder) 01/11/2023   PAD (peripheral artery disease) 09/21/2022   PVD (peripheral vascular disease) 09/21/2019   Right leg pain 03/23/2023   Shortness of breath    Sinus bradycardia 11/29/2019   Tobacco use disorder    Unhoused person 06/16/2023   Unstable angina (HCC) 06/15/2023     Assessment: 58 YOM presenting with foot pain, hx severe PAD s/p bypass prescribed Xarelto  PTA however non adherent.  -s/p mechanical thrombectomy of right femoral-popliteal bypass graft with alteplase  infusion (started 12/2) -s/p relook 12/3 - successful pharmacomechanical thrombylysis of bypass  Goal of Therapy:  Heparin  level 0.3-0.7 units/ml Monitor platelets by anticoagulation protocol: Yes   Plan:  Per Dr. Lanis, okay to restart heparin  at 8pm with regular goal Restart  heparin  800 units/hr F/u 8 hr heparin  level and daily  F/u ability to switch back to Xarelto   Vito Ralph, PharmD, BCPS Please see amion for complete clinical pharmacist phone list 02/01/2024 4:47 PM

## 2024-02-01 NOTE — Op Note (Signed)
    Patient name: Benjamin Espinoza MRN: [id number] DOB: May 27, 1965 Sex: male  01/30/2024 - 02/01/2024 Pre-operative Diagnosis: Right lower extremity acute on chronic limb ischemia, currently with lysis catheter Post-operative diagnosis:  Same Surgeon:  Fonda FORBES Rim, MD Procedure Performed: 1.  Lysis catheter recheck 2.  Right lower extremity angiography 3.  Right femoral to below-knee popliteal artery bypass stenting -realigned with 6 mm x 25 cm, 6 mm x 15 cm, 6 mm x 15 cm Viabahn stents 4.  Balloon venoplasty tibioperoneal trunk 3 x 60 mm 5.  Balloon venoplasty peroneal artery 3 x 60 mm 6.  Devices to closure-Pro-glide  Indications: Patient is a 58 year old male who presented with a several week history of right lower extremity rest pain with small ulcerations at the toes.  He underwent diagnostic angiogram yesterday resulting in lysis catheter placement.  He presents today for recheck.   Findings:  Recanalization of the right femoral to below-knee popliteal artery bypass with pharmacomechanical thrombolysis Residual thrombus at the proximal, mid, and distal portions of the bypass graft.  Patent tibioperoneal trunk with 90% flow-limiting stenosis at the proximal portion of the peroneal and posterior tibial arteries.  Two-vessel runoff to the foot.   Procedure:  The patient was identified in the holding area and taken to room 8.  The patient was then placed supine on the table and prepped and draped in the usual sterile fashion.  A time out was called.   The case began with removal of the EKOS ultrasound wire.  This was exchanged for a 0.018 wire.  The patient was heparinized and angiography followed.  See results above.  I elected to realign the previous bypass due to residual thrombus present within the bypass graft.  6 mm Gore Viabahn was used.  A 6 mm x 25 cm, 6 mm x 15 cm, 6 cm x 15 cm.  The entirety of the bypass was realigned.  Follow-up angiography demonstrated thrombus in the  tibioperoneal trunk which embolized.  I elected to use a balloon to see if I could improve the flow lumen.  A 3 x 60 mm balloon was brought onto the field and expanded.  I then used the same balloon into the peroneal artery.  Follow-up angiography demonstrated excellent result with resolution of flow-limiting stenosis in the tibioperoneal trunk and peroneal artery.  There was still flow-limiting stenosis at the posterior tibial artery.  I attempted recanalizing the artery, but had trouble entering.  On follow-up angiography, there was residual thrombus which had again lodged at the tibioperoneal trunk, likely from wire manipulation.  I used the 3 x 60 mm balloon again with resolution of flow-limiting stenosis.   Wires and catheters were removed.  The left groin was closed with a ProGlide device without issue.  Impression: Successful pharmacomechanical thrombolysis of the right femoral to below-knee popliteal artery bypass.  Relined with 6 mm Gore Viabahn stents with tibioperoneal trunk, peroneal artery outflow balloon plastied 3 x 60.    Fonda FORBES Rim MD [facility] [place] Office: [phone]

## 2024-02-01 NOTE — Progress Notes (Signed)
 PHARMACY - ANTICOAGULATION CONSULT NOTE  Pharmacy Consult for heparin  Indication: Ischemic limb  No Known Allergies  Patient Measurements: Height: 6' 1 (185.4 cm) Weight: 84.3 kg (185 lb 13.6 oz) IBW/kg (Calculated) : 79.9 HEPARIN  DW (KG): 101.3  Vital Signs: Temp: 98.6 F (37 C) (12/03 0831) Temp Source: Oral (12/03 0831) BP: 139/83 (12/03 0600) Pulse Rate: 77 (12/03 0600)  Labs: Recent Labs    01/30/24 1337 01/30/24 1553 01/31/24 0201 01/31/24 1534 02/01/24 0226 02/01/24 0648 02/01/24 0754  HGB 15.2  --  13.5   < > 12.7* 13.0 13.1  HCT 47.2  --  41.2   < > 39.5 39.9 40.2  PLT 237  --  235   < > 238 212 211  HEPARINUNFRC  --   --  0.83*   < > 0.21* 0.25* 0.18*  CREATININE 1.48*  --  1.39*  --   --  1.08  --   TROPONINIHS 19* 33*  --   --   --   --   --    < > = values in this interval not displayed.    Estimated Creatinine Clearance: 84.3 mL/min (by C-G formula based on SCr of 1.08 mg/dL).   Medical History: Past Medical History:  Diagnosis Date   Abnormal stress test    Acute left ankle pain 03/23/2023   Anxiety    Asthma    as a child   Atypical chest pain 05/07/2017   Chest pain  Unstable angina 06/15/2023   Chronic bilateral low back pain with left-sided sciatica 01/11/2023   Chronic health problem 06/15/2023   Chronic pain syndrome 03/29/2023   Cocaine abuse (HCC) 06/16/2023   Coronary artery disease    DDD (degenerative disc disease), lumbar 11/08/2019   Formatting of this note might be different from the original. 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 There is mild dilatation of the ascending aorta, measuring 43 mm. There is mild dilatation of the aortic root, measuring 44 mm.      Encounter for medication monitoring 01/10/2023   Encounter for well adult exam with abnormal findings 04/08/2023   Essential hypertension    Heart failure with mid-range ejection  fraction (HFmEF) (HCC) 06/17/2023   06/16/23 transthoracic echocardiogram: Left Ventricle: Left ventricular ejection fraction 45 to 50%. The left ventricle has mildly decreased function. No left ventricular hypertrophy. 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 [place]    Hyperlipidemia    Impaired mobility and ADLs 03/23/2023   Ischemic cardiomyopathy 12/17/2020   Lumbar radiculopathy 07/05/2023   Myocardial infarction [facility]) 2017   NSTEMI (non-ST elevated myocardial infarction) (HCC) 11/28/2020   OAB (overactive bladder) 01/11/2023   PAD (peripheral artery disease) 09/21/2022   PVD (peripheral vascular disease) 09/21/2019   Right leg pain 03/23/2023   Shortness of breath    Sinus bradycardia 11/29/2019   Tobacco use disorder    Unhoused person 06/16/2023   Unstable angina (HCC) 06/15/2023     Assessment: 58 YOM presenting with foot pain, hx severe PAD s/p bypass prescribed Xarelto  PTA however non adherent.  -s/p mechanical thrombectomy of right femoral-popliteal bypass graft with alteplase infusion (started 12/2) -heparin  level= 0.18 on heparin  800 units/hr and just slightly below goal -plans are to go back to lab today  Goal of Therapy:  Heparin  level 0.2-0.5 units/ml Monitor platelets by anticoagulation protocol: Yes   Plan:  No heparin   adjustment now as he is going back to lab soon Will follow plans post procedure  Prentice Poisson, PharmD Clinical Pharmacist **Pharmacist phone directory can now be found on [email or web address] ([email or web address]).  Listed under [facility].

## 2024-02-01 NOTE — Plan of Care (Signed)

## 2024-02-01 NOTE — Progress Notes (Addendum)
 OT Cancellation Note  Patient Details Name: Benjamin Espinoza MRN: [id number] DOB: 01/13/1966   Cancelled Treatment:    Reason Eval/Treat Not Completed: Patient at procedure or test/ unavailable (cath lab)  1425: Per RN pt with bedrest orders ending at 1830. Will follow up next date for OT evaluation  Laneta POUR, OTD, OTR/L SecureChat Preferred Acute Rehab [phone]   Laneta POUR Pereyra 02/01/2024, 11:49 AM

## 2024-02-01 NOTE — Progress Notes (Signed)
 PT Cancellation Note  Patient Details Name: Benjamin Espinoza MRN: [id number] DOB: 1965/03/23   Cancelled Treatment:    Reason Eval/Treat Not Completed: Active bedrest order. RN reporting pt on bedrest until 6:30 PM. Will follow up as able and appropriate.   [name], DPT, CLT  Acute Rehabilitation Services Office: [phone] (Secure chat preferred)    [name] 02/01/2024, 4:28 PM

## 2024-02-01 NOTE — Progress Notes (Signed)
 PHARMACY - ANTICOAGULATION CONSULT NOTE  Pharmacy Consult for heparin  Indication: Ischemic limb  No Known Allergies  Patient Measurements: Height: 6' 1 (185.4 cm) Weight: 84.3 kg (185 lb 13.6 oz) IBW/kg (Calculated) : 79.9 HEPARIN  DW (KG): 101.3  Vital Signs: Temp: 97.8 F (36.6 C) (12/02 2015) Temp Source: Axillary (12/02 2015) BP: 154/107 (12/03 0300) Pulse Rate: 79 (12/03 0300)  Labs: Recent Labs    01/30/24 1337 01/30/24 1337 01/30/24 1553 01/31/24 0201 01/31/24 1534 01/31/24 1950 02/01/24 0226  HGB 15.2  --   --  13.5 14.9 13.3 12.7*  HCT 47.2  --   --  41.2 45.9 40.9 39.5  PLT 237  --   --  235 210 239 238  HEPARINUNFRC  --    < >  --  0.83* >1.10* 0.50 0.21*  CREATININE 1.48*  --   --  1.39*  --   --   --   TROPONINIHS 19*  --  33*  --   --   --   --    < > = values in this interval not displayed.    Estimated Creatinine Clearance: 65.5 mL/min (A) (by C-G formula based on SCr of 1.39 mg/dL (H)).   Medical History: Past Medical History:  Diagnosis Date   Abnormal stress test    Acute left ankle pain 03/23/2023   Anxiety    Asthma    as a child   Atypical chest pain 05/07/2017   Chest pain  Unstable angina 06/15/2023   Chronic bilateral low back pain with left-sided sciatica 01/11/2023   Chronic health problem 06/15/2023   Chronic pain syndrome 03/29/2023   Cocaine abuse (HCC) 06/16/2023   Coronary artery disease    DDD (degenerative disc disease), lumbar 11/08/2019   Formatting of this note might be different from the original. 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 There is mild dilatation of the ascending aorta, measuring 43 mm. There is mild dilatation of the aortic root, measuring 44 mm.      Encounter for medication monitoring 01/10/2023   Encounter for well adult exam with abnormal findings 04/08/2023   Essential hypertension    Heart failure with mid-range  ejection fraction (HFmEF) (HCC) 06/17/2023   06/16/23 transthoracic echocardiogram: Left Ventricle: Left ventricular ejection fraction 45 to 50%. The left ventricle has mildly decreased function. No left ventricular hypertrophy. 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 [place]    Hyperlipidemia    Impaired mobility and ADLs 03/23/2023   Ischemic cardiomyopathy 12/17/2020   Lumbar radiculopathy 07/05/2023   Myocardial infarction [facility]) 2017   NSTEMI (non-ST elevated myocardial infarction) (HCC) 11/28/2020   OAB (overactive bladder) 01/11/2023   PAD (peripheral artery disease) 09/21/2022   PVD (peripheral vascular disease) 09/21/2019   Right leg pain 03/23/2023   Shortness of breath    Sinus bradycardia 11/29/2019   Tobacco use disorder    Unhoused person 06/16/2023   Unstable angina (HCC) 06/15/2023     Assessment: 58 YOM presenting with foot pain, hx severe PAD s/p bypass prescribed Xarelto  PTA however non adherent.  12/2 PM update: now s/p mechanical thrombectomy of right femoral-popliteal bypass graft with alteplase infusion. Heparin  infusion now at 800 units/hr, pharmacy to manage with goal levels 0.2-0.5. Received 9000 units heparin  in cath lab so this may still be affecting level.  12/3 AM update:  Heparin  level remains within goal range  for now  Goal of Therapy:  Heparin  level 0.2-0.5 units/ml Monitor platelets by anticoagulation protocol: Yes   Plan:  Continue heparin  at 800 units/hr Heparin  level to remain q6h while alteplase infusing  Lynwood Mckusick, PharmD, BCPS Clinical Pharmacist Phone: [phone]

## 2024-02-01 NOTE — Progress Notes (Signed)
 NAME:  Benjamin Espinoza, MRN:  [id number], DOB:  07-03-1965, LOS: 2 ADMISSION DATE:  01/30/2024,  CHIEF COMPLAINT: Critical limb ischemia.  History of Present Illness:  This 58 year old gentleman who has a history of significant coronary artery disease hypertension and peripheral arterial disease presents to vascular surgery team with right leg pain.  He had multiple failed bypass grafts in the past.  He was emergently taken to the OR and is found to have occluded right femoropopliteal bypass graft.  This was successfully crossed and mechanical thrombectomy was performed.  The graft still remained occluded therefore EKOS catheter was left for overnight thrombolytic therapy.  Plan to reevaluate tomorrow.  Vascular surgery is trying their best to salvage the limb.  If not an amputation is possible.   Patient is alert awake oriented x 3 denies nausea vomiting.  Pain is little better.  He confirms to smoking and being drugs.  He states that he has been adherent with his antiplatelet and anticoagulation therapy at home.  He is willing to quit smoking.  Pertinent  Medical History   Past Medical History:  Diagnosis Date   Abnormal stress test    Acute left ankle pain 03/23/2023   Anxiety    Asthma    as a child   Atypical chest pain 05/07/2017   Chest pain  Unstable angina 06/15/2023   Chronic bilateral low back pain with left-sided sciatica 01/11/2023   Chronic health problem 06/15/2023   Chronic pain syndrome 03/29/2023   Cocaine abuse (HCC) 06/16/2023   Coronary artery disease    DDD (degenerative disc disease), lumbar 11/08/2019   Formatting of this note might be different from the original. 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 There is mild dilatation of the ascending aorta, measuring 43 mm. There is mild dilatation of the aortic root, measuring 44 mm.      Encounter for medication monitoring  01/10/2023   Encounter for well adult exam with abnormal findings 04/08/2023   Essential hypertension    Heart failure with mid-range ejection fraction (HFmEF) (HCC) 06/17/2023   06/16/23 transthoracic echocardiogram: Left Ventricle: Left ventricular ejection fraction 45 to 50%. The left ventricle has mildly decreased function. No left ventricular hypertrophy. 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 [place]    Hyperlipidemia    Impaired mobility and ADLs 03/23/2023   Ischemic cardiomyopathy 12/17/2020   Lumbar radiculopathy 07/05/2023   Myocardial infarction [facility]) 2017   NSTEMI (non-ST elevated myocardial infarction) (HCC) 11/28/2020   OAB (overactive bladder) 01/11/2023   PAD (peripheral artery disease) 09/21/2022   PVD (peripheral vascular disease) 09/21/2019   Right leg pain 03/23/2023   Shortness of breath    Sinus bradycardia 11/29/2019   Tobacco use disorder    Unhoused person 06/16/2023   Unstable angina (HCC) 06/15/2023     Significant Hospital Events: Including procedures, antibiotic start and stop dates in addition to other pertinent events   01/31/2024 - Admitted to ICU post right lower extremity angiogram and thrombectomy. 02/01/2024 - Re-look angiogram of right LE planned.  Interim History / Subjective:  Patient remains on room air.  Hemodynamically stable.  Complains of severe pain in the right lower extremity.  He was getting morphine  every 1 hour that is not helping.  Pulses in the right lower extremity are better dopplerable than yesterday.  Objective    Blood pressure 139/83, pulse 77, temperature  98.6 F (37 C), temperature source Oral, resp. rate 11, height 6' 1 (1.854 m), weight 84.3 kg, SpO2 93%.        Intake/Output Summary (Last 24 hours) at 02/01/2024 0901 Last data filed at 02/01/2024 0600 Gross per 24 hour  Intake 2329.84 ml  Output 1300 ml  Net 1029.84 ml   Filed Weights   01/31/24 0540 01/31/24 1845  Weight: 104.6 kg  84.3 kg    Examination: Physical exam: General: Alert and oriented.  No respiratory distress.  Distress is mainly because of pain. HEENT: [place]/AT, eyes anicteric.  moist mucus membranes Neuro: Alert, awake following commands Chest: Coarse breath sounds, no wheezes or rhonchi Heart: Regular rate and rhythm, no murmurs or gallops Abdomen: Soft, nontender, nondistended, bowel sounds present Extremities: Right lower extremity very tender to touch.  Warm.  Dopplerable pulses.  Left lower extremity is warm nontender no swelling and good pulses in the posterior tibial and dorsalis pedis.   Resolved problem list   Assessment and Plan  Critical limb ischemia right lower extremity CAD status post CABG Hypertension Tobacco use disorder Severe peripheral vascular disease History of congestive heart failure with midrange ejection fraction History of substance use disorder -cocaine CKD stage III creatinine at baseline 1.39   - EKOS catheter in place with catheter directed thrombolysis in the right lower extremity femoral bypass graft - Plans to reevaluate with an angiogram today per vascular surgery.   - Monitor hemodynamics currently maintaining good blood pressure hydralazine  as needed is ordered keep SBP below 160 -Patient is on amlodipine  carvedilol  Imdur  Cozaar  at home.  These are being held as blood pressure is maintaining without this for now.  Restart when able. -Restart atorvastatin  prior to discharge. - Nicotine  patch in place - Patient at home on aspirin  Ranexa  and Xarelto  and he states he has been taking them.  Currently because of EKOS only Ranexa  is being continued. -Monitor renal functions urine output and electrolytes - Monitor hemoglobin -stable at 13.1. - Monitor for hematoma and hemorrhage at left femoral artery EKOS catheter.  ICU will take over care as long as the patient is in ICU  per the request of vascular surgery team.   Labs   CBC: Recent Labs  Lab 01/31/24 0201  01/31/24 1534 01/31/24 1950 02/01/24 0226 02/01/24 0648  WBC 5.6 6.5 6.4 6.1 6.9  HGB 13.5 14.9 13.3 12.7* 13.0  HCT 41.2 45.9 40.9 39.5 39.9  MCV 91.2 92.9 90.7 91.4 90.9  PLT 235 210 239 238 212    Basic Metabolic Panel: Recent Labs  Lab 01/30/24 1337 01/31/24 0201 02/01/24 0648  NA 139 140 139  K 4.3 3.7 3.7  CL 106 104 106  CO2 23 30 24   GLUCOSE 84 114* 107*  BUN 18 18 10   CREATININE 1.48* 1.39* 1.08  CALCIUM  10.1 9.7 9.5   GFR: Estimated Creatinine Clearance: 84.3 mL/min (by C-G formula based on SCr of 1.08 mg/dL). Recent Labs  Lab 01/31/24 1534 01/31/24 1950 02/01/24 0226 02/01/24 0648  WBC 6.5 6.4 6.1 6.9    Liver Function Tests: No results for input(s): AST, ALT, ALKPHOS, BILITOT, PROT, ALBUMIN in the last 168 hours. No results for input(s): LIPASE, AMYLASE in the last 168 hours. No results for input(s): AMMONIA in the last 168 hours.  ABG    Component Value Date/Time   TCO2 26 11/06/2023 1339     Coagulation Profile: No results for input(s): INR, PROTIME in the last 168 hours.  Cardiac Enzymes: No results  for input(s): CKTOTAL, CKMB, CKMBINDEX, TROPONINI in the last 168 hours.  HbA1C: Hgb A1c MFr Bld  Date/Time Value Ref Range Status  10/13/2023 06:49 PM 5.4 4.8 - 5.6 % Final    Comment:    (NOTE) Diagnosis of Diabetes The following HbA1c ranges recommended by the American Diabetes Association (ADA) may be used as an aid in the diagnosis of diabetes mellitus.  Hemoglobin             Suggested A1C NGSP%              Diagnosis  <5.7                   Non Diabetic  5.7-6.4                Pre-Diabetic  >6.4                   Diabetic  <7.0                   Glycemic control for                       adults with diabetes.    11/29/2020 02:03 AM 5.3 4.8 - 5.6 % Final    Comment:    (NOTE) Pre diabetes:          5.7%-6.4%  Diabetes:              >6.4%  Glycemic control for   <7.0% adults with  diabetes     CBG: No results for input(s): GLUCAP in the last 168 hours.  Review of Systems:   12 point review of systems is done which is negative for nothing else what is mentioned in HPI.  Past Medical History:  He,  has a past medical history of Abnormal stress test, Acute left ankle pain (03/23/2023), Anxiety, Asthma, Atypical chest pain (05/07/2017), Chest pain  Unstable angina (06/15/2023), Chronic bilateral low back pain with left-sided sciatica (01/11/2023), Chronic health problem (06/15/2023), Chronic pain syndrome (03/29/2023), Cocaine abuse (HCC) (06/16/2023), Coronary artery disease, DDD (degenerative disc disease), lumbar (11/08/2019), Dilation of thoracic aorta (06/17/2023), Encounter for medication monitoring (01/10/2023), Encounter for well adult exam with abnormal findings (04/08/2023), Essential hypertension, Heart failure with mid-range ejection fraction (HFmEF) (HCC) (06/17/2023), CABG (x4 01/14/2016 [place] ), Hyperlipidemia, Impaired mobility and ADLs (03/23/2023), Ischemic cardiomyopathy (12/17/2020), Lumbar radiculopathy (07/05/2023), Myocardial infarction (HCC) (2017), NSTEMI (non-ST elevated myocardial infarction) (HCC) (11/28/2020), OAB (overactive bladder) (01/11/2023), PAD (peripheral artery disease) (09/21/2022), PVD (peripheral vascular disease) (09/21/2019), Right leg pain (03/23/2023), Shortness of breath, Sinus bradycardia (11/29/2019), Tobacco use disorder, Unhoused person (06/16/2023), and Unstable angina (HCC) (06/15/2023).   Surgical History:   Past Surgical History:  Procedure Laterality Date   ABDOMINAL AORTOGRAM W/LOWER EXTREMITY Bilateral 09/21/2019   Procedure: ABDOMINAL AORTOGRAM W/LOWER EXTREMITY;  Surgeon: Eliza Lonni RAMAN, MD;  Location[facility] INVASIVE CV LAB;  Service: Cardiovascular;  Laterality: Bilateral;   ABDOMINAL AORTOGRAM W/LOWER EXTREMITY N/A 12/07/2019   Procedure: ABDOMINAL AORTOGRAM W/LOWER EXTREMITY;  Surgeon: Eliza Lonni RAMAN, MD;  Location: [facility] INVASIVE CV LAB;  Service: Cardiovascular;  Laterality: N/A;   ABDOMINAL AORTOGRAM W/LOWER EXTREMITY N/A 09/09/2022   Procedure: ABDOMINAL AORTOGRAM W/LOWER EXTREMITY;  Surgeon: Gretta Lonni PARAS, MD;  Location: [facility] INVASIVE CV LAB;  Service: Vascular;  Laterality: N/A;   CARDIAC CATHETERIZATION     COLONOSCOPY N/A 11/19/2023   Procedure: COLONOSCOPY;  Surgeon: Stacia Glendia BRAVO, MD;  Location: [facility] ENDOSCOPY;  Service:  Gastroenterology;  Laterality: N/A;   CORONARY ARTERY BYPASS GRAFT     quadruple bypas 2017   CORONARY STENT INTERVENTION N/A 11/26/2019   Procedure: CORONARY STENT INTERVENTION;  Surgeon: Mady Bruckner, MD;  Location: [facility] INVASIVE CV LAB;  Service: Cardiovascular;  Laterality: N/A;   ESOPHAGOGASTRODUODENOSCOPY N/A 11/19/2023   Procedure: EGD (ESOPHAGOGASTRODUODENOSCOPY);  Surgeon: Stacia Glendia BRAVO, MD;  Location: [facility] ENDOSCOPY;  Service: Gastroenterology;  Laterality: N/A;   FEMORAL-POPLITEAL BYPASS GRAFT Right 12/25/2019   Procedure: BYPASS GRAFT FEMORAL- BELOW KNEE POPLITEAL ARTERY RIGHT USING GORE PROPATEN VASCULAR GRAFT;  Surgeon: Eliza Bruckner RAMAN, MD;  Location: [facility] OR;  Service: Vascular;  Laterality: Right;   FEMORAL-POPLITEAL BYPASS GRAFT Right 09/21/2022   Procedure: REDO RIGHT LOWER EXTREMITY BYPASS USING GORE PROPATEN 6mm REMOVALBLE RING GRAFT;  Surgeon: Eliza Bruckner RAMAN, MD;  Location: [facility] OR;  Service: Vascular;  Laterality: Right;   INTRAOPERATIVE ARTERIOGRAM Right 09/21/2022   Procedure: INTRA OPERATIVE ARTERIOGRAM;  Surgeon: Eliza Bruckner RAMAN, MD;  Location: [facility] OR;  Service: Vascular;  Laterality: Right;   LEFT HEART CATH AND CORS/GRAFTS ANGIOGRAPHY N/A 11/26/2019   Procedure: LEFT HEART CATH AND CORS/GRAFTS ANGIOGRAPHY;  Surgeon: Mady Bruckner, MD;  Location: [facility] INVASIVE CV LAB;  Service: Cardiovascular;  Laterality: N/A;   LEFT HEART CATH AND CORS/GRAFTS ANGIOGRAPHY N/A 12/01/2020   Procedure: LEFT HEART CATH AND  CORS/GRAFTS ANGIOGRAPHY;  Surgeon: Burnard Debby LABOR, MD;  Location: [facility] INVASIVE CV LAB;  Service: Cardiovascular;  Laterality: N/A;   LEFT HEART CATH AND CORS/GRAFTS ANGIOGRAPHY N/A 06/16/2023   Procedure: LEFT HEART CATH AND CORS/GRAFTS ANGIOGRAPHY;  Surgeon: Jordan, Peter M, MD;  Location: [facility] INVASIVE CV LAB;  Service: Cardiovascular;  Laterality: N/A;   LOWER EXTREMITY ANGIOGRAM Right 12/25/2019   Procedure: RIGHT LOWER EXTREMITY ANGIOGRAM;  Surgeon: Eliza Bruckner RAMAN, MD;  Location: [facility] OR;  Service: Vascular;  Laterality: Right;   PERIPHERAL VASCULAR BALLOON ANGIOPLASTY Left 12/07/2019   Procedure: PERIPHERAL VASCULAR BALLOON ANGIOPLASTY;  Surgeon: Eliza Bruckner RAMAN, MD;  Location: [facility] INVASIVE CV LAB;  Service: Cardiovascular;  Laterality: Left;  SFA     Social History:   reports that he has been smoking cigars and cigarettes. He has been exposed to tobacco smoke. He has never used smokeless tobacco. He reports current drug use. Drugs: Marijuana and Cocaine. He reports that he does not drink alcohol.   Family History:  His family history includes Heart disease in his father, mother, and sister.   Allergies No Known Allergies   Home Medications  Prior to Admission medications   Medication Sig Start Date End Date Taking? Authorizing Provider  amLODipine  (NORVASC ) 5 MG tablet Take 1 tablet (5 mg total) by mouth daily. 12/12/23 03/11/24 Yes Fountain, Madison L, NP  aspirin  325 MG tablet Take 325 mg by mouth daily.   Yes [provider]  atorvastatin  (LIPITOR ) 80 MG tablet Take 1 tablet (80 mg total) by mouth daily. 11/06/23  Yes Dean Clarity, MD  carvedilol  (COREG ) 6.25 MG tablet Take 1 tablet (6.25 mg total) by mouth 2 (two) times daily with a meal. 11/06/23  Yes Dean Clarity, MD  gabapentin  (NEURONTIN ) 300 MG capsule Take 1 capsule (300 mg total) by mouth 3 (three) times daily. Patient taking differently: Take 300 mg by mouth 3 (three) times daily as needed (neuropathy,  pain). 11/06/23  Yes Dean Clarity, MD  isosorbide  mononitrate (IMDUR ) 120 MG 24 hr tablet Take 1 tablet (120 mg total) by mouth daily. Patient taking differently: Take 120 mg by mouth 2 (  two) times daily. 11/06/23  Yes Dean Clarity, MD  losartan  (COZAAR ) 50 MG tablet Take 1 tablet (50 mg total) by mouth daily. 11/06/23  Yes Dean Clarity, MD  nicotine  (NICODERM CQ  - DOSED IN MG/24 HOURS) 14 mg/24hr patch Place 1 patch (14 mg total) onto the skin daily. 11/19/23  Yes [name], [name], DO  ranolazine  (RANEXA ) 1000 MG SR tablet Take 1 tablet (1,000 mg total) by mouth 2 (two) times daily. Patient taking differently: Take 1,000 mg by mouth daily. 11/06/23  Yes Dean Clarity, MD  rivaroxaban  (XARELTO ) 20 MG TABS tablet Take 1 tablet (20 mg total) by mouth daily with supper. 12/12/23  Yes Fountain, Madison L, NP  cilostazol  (PLETAL ) 100 MG tablet Take 1 tablet (100 mg total) by mouth 2 (two) times daily. Patient not taking: Reported on 12/12/2023 11/06/23   Dean Clarity, MD  nicotine  polacrilex (NICORETTE ) 4 MG gum Take 1 each (4 mg total) by mouth as needed for smoking cessation. Patient not taking: Reported on 01/31/2024 12/01/23   Kennyth Domino, FNP  nitroGLYCERIN  (NITROSTAT ) 0.4 MG SL tablet Place 1 tablet (0.4 mg total) under the tongue every 5 (five) minutes as needed for chest pain. Patient not taking: Reported on 01/31/2024 12/12/23   Rana Lum CROME, NP     Critical care time: 35 minutes      Tamela Stakes, MD  Attending Physician, Critical Care Medicine [facility] See Amion for pager If no response to pager, please call [phone] until 7pm After 7pm, Please call E-link [phone]

## 2024-02-01 NOTE — TOC Initial Note (Signed)
 Transition of Care [facility]) - Initial/Assessment Note    Patient Details  Name: Benjamin Espinoza MRN: [id number] Date of Birth: 10-25-1965  [facility] CM/SW Contact:    Sudie Erminio Deems, RN Phone Number: 02/01/2024, 4:30 PM  Clinical Narrative:  Patient presented for RLE rest pain with small ulcerations to the toes. Patient post diagnostic angiogram with lysis catheter placement. PTA patient was from home with roommates. Patient states he lives in a boarding house [address] [place] with two other roommates. Patient has DME cane in the room. Patient states [facility] and he gets support from the [facility]. Patient states he has a Academic Librarian from [facility] that he contacts for additional assistance if needed. Patient states the boarding house is cold and that he needs assistance with a portable heater. CSW was notified and she will discuss some resources with the patient. Patient will need a cab voucher once stable for discharge home. ICM will continue to follow for additional needs.      Expected Discharge Plan: Home/Self Care  Expected Discharge Plan and Services In-house Referral: Clinical Social Work Discharge Planning Services: CM Consult Post Acute Care Choice: NA (Boarding home-rents a room) Living arrangements for the past 2 months: Boarding House                   DME Agency: NA       HH Arranged: NA  Prior Living Arrangements/Services Living arrangements for the past 2 months: [employer] Lives with:: Self, Roommate Patient language and need for interpreter reviewed:: Yes Do you feel safe going back to the place where you live?: Yes      Need for Family Participation in Patient Care: No (Comment) Care giver support system in place?: No (comment) Current home services: DME (cane) Criminal Activity/Legal Involvement Pertinent to Current Situation/Hospitalization: No - Comment as needed  Permission  Sought/Granted Permission sought to share information with : Family Supports, Case Manager   Emotional Assessment   Attitude/Demeanor/Rapport: Engaged Affect (typically observed): Appropriate Orientation: : Oriented to Self, Oriented to Place, Oriented to  Time, Oriented to Situation Alcohol / Substance Use: Not Applicable Psych Involvement: No (comment)  Admission diagnosis:  Polysubstance abuse (HCC) [F19.10] Critical limb ischemia of right lower extremity (HCC) [I70.221] Patient Active Problem List   Diagnosis Date Noted   Polysubstance abuse (HCC) 01/31/2024   Critical limb ischemia of right lower extremity (HCC) 01/30/2024   Coronary artery disease involving coronary bypass graft of native heart with angina pectoris 12/06/2023   Symptomatic anemia 11/18/2023   Chronic anticoagulation 11/18/2023   History of CVA (cerebrovascular accident) 11/18/2023   Gastrointestinal hemorrhage 11/18/2023   Positive fecal occult blood test 11/18/2023   Acute CVA (cerebrovascular accident) (HCC) 10/13/2023   Chronic kidney disease, stage 3a (HCC) 10/13/2023   Aortic dilatation 10/13/2023   Lumbar radiculopathy 07/05/2023   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/17/2023   Heart failure with mid-range ejection fraction (HFmEF) (HCC) 06/17/2023   Cocaine abuse (HCC) 06/16/2023   Unhoused person 06/16/2023   Chest pain  Unstable angina 06/15/2023   Chronic health problem 06/15/2023   Unstable angina (HCC) 06/15/2023   Encounter for well adult exam with abnormal findings 04/08/2023   Chronic pain syndrome 03/29/2023   Acute left ankle pain 03/23/2023   Impaired mobility and ADLs 03/23/2023   Right leg pain 03/23/2023   Chronic bilateral low back pain with left-sided sciatica 01/11/2023   OAB (overactive bladder)  01/11/2023   Encounter for medication monitoring 01/10/2023   PAD (peripheral artery disease) 09/21/2022   Ischemic cardiomyopathy 12/17/2020   NSTEMI (non-ST elevated myocardial  infarction) (HCC) 11/28/2020   Anxiety    Asthma    Sinus bradycardia 11/29/2019   Abnormal stress test    Shortness of breath    DDD (degenerative disc disease), lumbar 11/08/2019   Essential hypertension    Hx of CABG    Hyperlipidemia    Myocardial infarction (HCC)    Tobacco use disorder    PVD (peripheral vascular disease) 09/21/2019   Atypical chest pain 05/07/2017   PCP:  [name], [name], DO Pharmacy:   [id number] - [place] ([place]), [place] - [address] [place] ([place]) [place] [address] Phone: [phone] Fax: [phone]  [id number] - [place], [place] - [address] AT [facility] [address] [facility] [address] [place] [address] Phone: [phone] Fax: [phone]     Social Drivers of Health (SDOH) Social History: SDOH Screenings   Food Insecurity: Food Insecurity Present (02/01/2024)  Housing: Low Risk  (02/01/2024)  Transportation Needs: Unmet Transportation Needs (02/01/2024)  Utilities: Not At Risk (02/01/2024)  Alcohol Screen: Low Risk  (03/25/2023)  Depression (PHQ2-9): Low Risk  (06/09/2023)  Recent Concern: Depression (PHQ2-9) - Medium Risk (03/23/2023)  Financial Resource Strain: High Risk (06/09/2023)  Physical Activity: Inactive (03/25/2023)  Social Connections: Socially Integrated (06/16/2023)  Stress: Stress Concern Present (11/25/2023)  Tobacco Use: High Risk (01/30/2024)  Health Literacy: Adequate Health Literacy (03/25/2023)   SDOH Interventions:     Readmission Risk Interventions     No data to display

## 2024-02-01 NOTE — Progress Notes (Signed)
 hospital   Subjective  - POD #1  Says his right foot has a little more feeling Oozing from acces site   Physical Exam:  Still with minimal pedal signals       Assessment/Plan:  POD #1  Plan for follow up lysis study today   Benjamin Espinoza 02/01/2024 8:41 AM --  Vitals:   02/01/24 0600 02/01/24 0831  BP: 139/83   Pulse: 77   Resp: 11   Temp:  98.6 F (37 C)  SpO2: 93%     Intake/Output Summary (Last 24 hours) at 02/01/2024 0841 Last data filed at 02/01/2024 0600 Gross per 24 hour  Intake 2329.84 ml  Output 1500 ml  Net 829.84 ml     Laboratory CBC    Component Value Date/Time   WBC 6.9 02/01/2024 0648   HGB 13.0 02/01/2024 0648   HGB 14.2 04/09/2021 0904   HCT 39.9 02/01/2024 0648   HCT 41.6 04/09/2021 0904   PLT 212 02/01/2024 0648   PLT 231 04/09/2021 0904    BMET    Component Value Date/Time   NA 139 02/01/2024 0648   NA 141 04/09/2021 0904   K 3.7 02/01/2024 0648   CL 106 02/01/2024 0648   CO2 24 02/01/2024 0648   GLUCOSE 107 (H) 02/01/2024 0648   BUN 10 02/01/2024 0648   BUN 13 04/09/2021 0904   CREATININE 1.08 02/01/2024 0648   CALCIUM  9.5 02/01/2024 0648   GFRNONAA >60 02/01/2024 0648   GFRAA 99 11/13/2019 1359    COAG Lab Results  Component Value Date   INR 2.0 (H) 11/18/2023   INR 1.0 09/21/2022   INR 1.0 12/25/2019   No results found for: PTT  Antibiotics Anti-infectives (From admission, onward)    None        V. Malvina Serene CLORE, M.D., [facility] Office: [phone] Pager:  [phone]

## 2024-02-02 ENCOUNTER — Inpatient Hospital Stay (HOSPITAL_COMMUNITY)

## 2024-02-02 ENCOUNTER — Encounter (HOSPITAL_COMMUNITY): Payer: Self-pay | Admitting: Vascular Surgery

## 2024-02-02 DIAGNOSIS — R2242 Localized swelling, mass and lump, left lower limb: Secondary | ICD-10-CM | POA: Diagnosis not present

## 2024-02-02 LAB — BASIC METABOLIC PANEL WITH GFR
Anion gap: 7 (ref 5–15)
BUN: 8 mg/dL (ref 6–20)
CO2: 24 mmol/L (ref 22–32)
Calcium: 9 mg/dL (ref 8.9–10.3)
Chloride: 103 mmol/L (ref 98–111)
Creatinine, Ser: 1.14 mg/dL (ref 0.61–1.24)
GFR, Estimated: >60 mL/min (ref >60)
Glucose, Bld: 92 mg/dL (ref 70–99)
Potassium: 3.7 mmol/L (ref 3.5–5.1)
Sodium: 134 mmol/L — ABNORMAL LOW (ref 135–145)

## 2024-02-02 LAB — CBC
HCT: 35.4 % — ABNORMAL LOW (ref 39.0–52.0)
Hemoglobin: 11.7 g/dL — ABNORMAL LOW (ref 13.0–17.0)
MCH: 29.8 pg (ref 26.0–34.0)
MCHC: 33.1 g/dL (ref 30.0–36.0)
MCV: 90.3 fL (ref 80.0–100.0)
Platelets: 187 K/uL (ref 150–400)
RBC: 3.92 MIL/uL — ABNORMAL LOW (ref 4.22–5.81)
RDW: 14.7 % (ref 11.5–15.5)
WBC: 6.9 K/uL (ref 4.0–10.5)
nRBC: 0 % (ref 0.0–0.2)

## 2024-02-02 LAB — HEPARIN LEVEL (UNFRACTIONATED)
Heparin Unfractionated: <0.1 [IU]/mL — ABNORMAL LOW (ref 0.30–0.70)
Heparin Unfractionated: 0.33 [IU]/mL (ref 0.30–0.70)

## 2024-02-02 LAB — PHOSPHORUS: Phosphorus: 2.8 mg/dL (ref 2.5–4.6)

## 2024-02-02 LAB — MAGNESIUM: Magnesium: 1.6 mg/dL — ABNORMAL LOW (ref 1.7–2.4)

## 2024-02-02 MED ORDER — HEPARIN (PORCINE) 25000 UT/250ML-% IV SOLN: 1100.0000 [IU]/h | INTRAVENOUS | Status: DC

## 2024-02-02 MED ORDER — HEPARIN (PORCINE) 25000 UT/250ML-% IV SOLN
1100.0000 [IU]/h | INTRAVENOUS | Status: DC
  Administered 2024-02-03: 1100 [IU]/h via INTRAVENOUS
  Filled 2024-02-02: qty 250

## 2024-02-02 MED ORDER — RIVAROXABAN 20 MG PO TABS
20.0000 mg | ORAL_TABLET | Freq: Every day | ORAL | Status: DC
  Filled 2024-02-02: qty 1

## 2024-02-02 MED ORDER — PROTAMINE SULFATE 10 MG/ML IV SOLN
50.0000 mg | Freq: Once | INTRAVENOUS | Status: AC
  Administered 2024-02-02: 50 mg via INTRAVENOUS

## 2024-02-02 MED ORDER — ASPIRIN 81 MG PO TBEC: 81.0000 mg | DELAYED_RELEASE_TABLET | Freq: Every day | ORAL | Status: DC

## 2024-02-02 MED ORDER — ASPIRIN 81 MG PO TBEC
81.0000 mg | DELAYED_RELEASE_TABLET | Freq: Every day | ORAL | Status: DC
  Administered 2024-02-02 – 2024-02-04 (×3): 81 mg via ORAL
  Filled 2024-02-02 (×2): qty 1

## 2024-02-02 MED ORDER — MAGNESIUM SULFATE 4 GM/100ML IV SOLN
4.0000 g | Freq: Once | INTRAVENOUS | Status: AC
  Administered 2024-02-02: 4 g via INTRAVENOUS
  Filled 2024-02-02: qty 100

## 2024-02-02 MED ORDER — PROTAMINE SULFATE 10 MG/ML IV SOLN
INTRAVENOUS | Status: AC
  Filled 2024-02-02: qty 5

## 2024-02-02 MED ORDER — POTASSIUM CHLORIDE CRYS ER 20 MEQ PO TBCR
30.0000 meq | EXTENDED_RELEASE_TABLET | Freq: Once | ORAL | Status: AC
  Administered 2024-02-02: 30 meq via ORAL
  Filled 2024-02-02: qty 1

## 2024-02-02 MED ORDER — MORPHINE SULFATE (PF) 2 MG/ML IV SOLN
5.0000 mg | Freq: Once | INTRAVENOUS | Status: AC
  Administered 2024-02-02: 5 mg via INTRAVENOUS

## 2024-02-02 MED ORDER — MORPHINE SULFATE (PF) 2 MG/ML IV SOLN
5.0000 mg | INTRAVENOUS | Status: DC | PRN
  Administered 2024-02-02 – 2024-02-03 (×3): 5 mg via INTRAVENOUS
  Filled 2024-02-02 (×3): qty 3

## 2024-02-02 MED ORDER — MORPHINE SULFATE (PF) 2 MG/ML IV SOLN
INTRAVENOUS | Status: AC
  Filled 2024-02-02: qty 3

## 2024-02-02 NOTE — Progress Notes (Addendum)
 NAME:  Benjamin Espinoza, MRN:  [id number], DOB:  1965-04-30, LOS: 3 ADMISSION DATE:  01/30/2024,  CHIEF COMPLAINT: Critical limb ischemia.  History of Present Illness:  This 58 year old gentleman who has a history of significant coronary artery disease hypertension and peripheral arterial disease presents to vascular surgery team with right leg pain.  He had multiple failed bypass grafts in the past.  He was emergently taken to the OR and is found to have occluded right femoropopliteal bypass graft.  This was successfully crossed and mechanical thrombectomy was performed.  The graft still remained occluded therefore EKOS catheter was left for overnight thrombolytic therapy.  Patient was taken for RLE fem-pop bypass lysis, relining with Vascular 12/3. PCCM consulted for ICU admission.   Pertinent  Medical History   Past Medical History:  Diagnosis Date   Abnormal stress test    Acute left ankle pain 03/23/2023   Anxiety    Asthma    as a child   Atypical chest pain 05/07/2017   Chest pain  Unstable angina 06/15/2023   Chronic bilateral low back pain with left-sided sciatica 01/11/2023   Chronic health problem 06/15/2023   Chronic pain syndrome 03/29/2023   Cocaine abuse (HCC) 06/16/2023   Coronary artery disease    DDD (degenerative disc disease), lumbar 11/08/2019   Formatting of this note might be different from the original. 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 There is mild dilatation of the ascending aorta, measuring 43 mm. There is mild dilatation of the aortic root, measuring 44 mm.      Encounter for medication monitoring 01/10/2023   Encounter for well adult exam with abnormal findings 04/08/2023   Essential hypertension    Heart failure with mid-range ejection fraction (HFmEF) (HCC) 06/17/2023   06/16/23 transthoracic echocardiogram: Left Ventricle: Left ventricular ejection fraction 45 to  50%. The left ventricle has mildly decreased function. No left ventricular hypertrophy. 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 [place]    Hyperlipidemia    Impaired mobility and ADLs 03/23/2023   Ischemic cardiomyopathy 12/17/2020   Lumbar radiculopathy 07/05/2023   Myocardial infarction [facility]) 2017   NSTEMI (non-ST elevated myocardial infarction) (HCC) 11/28/2020   OAB (overactive bladder) 01/11/2023   PAD (peripheral artery disease) 09/21/2022   PVD (peripheral vascular disease) 09/21/2019   Right leg pain 03/23/2023   Shortness of breath    Sinus bradycardia 11/29/2019   Tobacco use disorder    Unhoused person 06/16/2023   Unstable angina (HCC) 06/15/2023     Significant Hospital Events: Including procedures, antibiotic start and stop dates in addition to other pertinent events   01/31/2024 - Admitted to ICU post right lower extremity angiogram and thrombectomy. 02/01/2024 - right lower extremity bypass lysis, relining   Interim History / Subjective:  NAEO. Doppler pulses present to RLE on exam. Persistent c/o pain to RLE improving from baseline. Groin site hemostatic, soft. Patient worried to discharge today d/t lack of heat at home. Will f/u case mgmt today to see progress on heating supplies. From vascular standpoint, okay to discharge home. DC pending heat resources/PT eval; perhaps this afternoon. Will transition hep gtt to DOAC today.  Objective    Blood pressure (!) 135/96, pulse (!) 102, temperature 100.1 F (37.8 C), temperature source Oral, resp. rate (!) 21, height 6' 1 (1.854 m), weight 84.3 kg, SpO2 96%.  Intake/Output Summary (Last 24 hours) at 02/02/2024 0941 Last data filed at 02/02/2024 0800 Gross per 24 hour  Intake 321.12 ml  Output 1100 ml  Net -778.88 ml   Filed Weights   01/31/24 0540 01/31/24 1845  Weight: 104.6 kg 84.3 kg   Examination: General: well-developed male lying in bed, in NAD HEENT: AT/[place], PERRL,  Pulm:  normal inspiratory and expiratory effort on RA, coarse breath sounds CV: RRR, no m/g/r GI: soft, non distended, non tender to palpation Extremities: Right lower extremity very tender to touch.  Warm.  Doppler DP/PT pulses BLE present. Groin site hemostatic/soft Neuro: A&O x3, no focal deficits  Resolved problem list   Assessment and Plan  Critical limb ischemia right lower extremity CAD status post CABG Hypertension Tobacco use disorder Severe peripheral vascular disease History of congestive heart failure with midrange ejection fraction History of substance use disorder -cocaine CKD stage III creatinine at baseline 1.39   - EKOS stopped 12/3 - RLE bypass lysis, relining w/ Vascular surgery 12/3 - Monitor hemodynamics currently maintaining good blood pressure hydralazine  as needed is ordered keep SBP below 160 -Restart home antihypertensives as indicated - Home Lipito restarted - Nicotine  patch in place - Patient at home on aspirin  Ranexa  and Xarelto  and he states he has been taking them.  EKOS discontinued, plan to restart Xarelto  today - Monitor renal functions urine output and electrolytes - Monitor hemoglobin -stable at 11.7 - Left groin site hemostatic/soft  Substance abuse -Confirms drug/tobacco abuse -Confirms willingness to stop drugs/smoking  Dispo: home pending heat resources for home/PT eval.  ICU will take over care as long as the patient is in ICU  per the request of vascular surgery team.  Labs   CBC: Recent Labs  Lab 01/31/24 1950 02/01/24 0226 02/01/24 0648 02/01/24 0754 02/02/24 0247  WBC 6.4 6.1 6.9 7.3 6.9  HGB 13.3 12.7* 13.0 13.1 11.7*  HCT 40.9 39.5 39.9 40.2 35.4*  MCV 90.7 91.4 90.9 91.0 90.3  PLT 239 238 212 211 187    Basic Metabolic Panel: Recent Labs  Lab 01/30/24 1337 01/31/24 0201 02/01/24 0648 02/02/24 0530  NA 139 140 139 134*  K 4.3 3.7 3.7 3.7  CL 106 104 106 103  CO2 23 30 24 24   GLUCOSE 84 114* 107* 92  BUN 18 18 10 8    CREATININE 1.48* 1.39* 1.08 1.14  CALCIUM  10.1 9.7 9.5 9.0  MG  --   --   --  1.6*  PHOS  --   --   --  2.8   GFR: Estimated Creatinine Clearance: 79.8 mL/min (by C-G formula based on SCr of 1.14 mg/dL). Recent Labs  Lab 02/01/24 0226 02/01/24 0648 02/01/24 0754 02/02/24 0247  WBC 6.1 6.9 7.3 6.9    Liver Function Tests: No results for input(s): AST, ALT, ALKPHOS, BILITOT, PROT, ALBUMIN in the last 168 hours. No results for input(s): LIPASE, AMYLASE in the last 168 hours. No results for input(s): AMMONIA in the last 168 hours.  ABG    Component Value Date/Time   TCO2 26 11/06/2023 1339     Coagulation Profile: No results for input(s): INR, PROTIME in the last 168 hours.  Cardiac Enzymes: No results for input(s): CKTOTAL, CKMB, CKMBINDEX, TROPONINI in the last 168 hours.  HbA1C: Hgb A1c MFr Bld  Date/Time Value Ref Range Status  10/13/2023 06:49 PM 5.4 4.8 - 5.6 % Final    Comment:    (NOTE) Diagnosis of Diabetes The following HbA1c ranges recommended by  the American Diabetes Association (ADA) may be used as an aid in the diagnosis of diabetes mellitus.  Hemoglobin             Suggested A1C NGSP%              Diagnosis  <5.7                   Non Diabetic  5.7-6.4                Pre-Diabetic  >6.4                   Diabetic  <7.0                   Glycemic control for                       adults with diabetes.    11/29/2020 02:03 AM 5.3 4.8 - 5.6 % Final    Comment:    (NOTE) Pre diabetes:          5.7%-6.4%  Diabetes:              >6.4%  Glycemic control for   <7.0% adults with diabetes     CBG: No results for input(s): GLUCAP in the last 168 hours.  Review of Systems:   12 point review of systems is done which is negative for nothing else what is mentioned in HPI.  Past Medical History:  He,  has a past medical history of Abnormal stress test, Acute left ankle pain (03/23/2023), Anxiety, Asthma, Atypical  chest pain (05/07/2017), Chest pain  Unstable angina (06/15/2023), Chronic bilateral low back pain with left-sided sciatica (01/11/2023), Chronic health problem (06/15/2023), Chronic pain syndrome (03/29/2023), Cocaine abuse (HCC) (06/16/2023), Coronary artery disease, DDD (degenerative disc disease), lumbar (11/08/2019), Dilation of thoracic aorta (06/17/2023), Encounter for medication monitoring (01/10/2023), Encounter for well adult exam with abnormal findings (04/08/2023), Essential hypertension, Heart failure with mid-range ejection fraction (HFmEF) (HCC) (06/17/2023), CABG (x4 01/14/2016 [place] ), Hyperlipidemia, Impaired mobility and ADLs (03/23/2023), Ischemic cardiomyopathy (12/17/2020), Lumbar radiculopathy (07/05/2023), Myocardial infarction (HCC) (2017), NSTEMI (non-ST elevated myocardial infarction) (HCC) (11/28/2020), OAB (overactive bladder) (01/11/2023), PAD (peripheral artery disease) (09/21/2022), PVD (peripheral vascular disease) (09/21/2019), Right leg pain (03/23/2023), Shortness of breath, Sinus bradycardia (11/29/2019), Tobacco use disorder, Unhoused person (06/16/2023), and Unstable angina (HCC) (06/15/2023).   Surgical History:   Past Surgical History:  Procedure Laterality Date   ABDOMINAL AORTOGRAM W/LOWER EXTREMITY Bilateral 09/21/2019   Procedure: ABDOMINAL AORTOGRAM W/LOWER EXTREMITY;  Surgeon: Eliza Lonni RAMAN, MD;  Location: [facility] INVASIVE CV LAB;  Service: Cardiovascular;  Laterality: Bilateral;   ABDOMINAL AORTOGRAM W/LOWER EXTREMITY N/A 12/07/2019   Procedure: ABDOMINAL AORTOGRAM W/LOWER EXTREMITY;  Surgeon: Eliza Lonni RAMAN, MD;  Location: [facility] INVASIVE CV LAB;  Service: Cardiovascular;  Laterality: N/A;   ABDOMINAL AORTOGRAM W/LOWER EXTREMITY N/A 09/09/2022   Procedure: ABDOMINAL AORTOGRAM W/LOWER EXTREMITY;  Surgeon: Gretta Lonni PARAS, MD;  Location: [facility] INVASIVE CV LAB;  Service: Vascular;  Laterality: N/A;   ABDOMINAL AORTOGRAM W/LOWER EXTREMITY N/A  01/31/2024   Procedure: ABDOMINAL AORTOGRAM W/LOWER EXTREMITY;  Surgeon: Serene Gaile ORN, MD;  Location: [facility] INVASIVE CV LAB;  Service: Cardiovascular;  Laterality: N/A;   CARDIAC CATHETERIZATION     COLONOSCOPY N/A 11/19/2023   Procedure: COLONOSCOPY;  Surgeon: Stacia Glendia BRAVO, MD;  Location: [facility] ENDOSCOPY;  Service: Gastroenterology;  Laterality: N/A;   CORONARY ARTERY BYPASS GRAFT     quadruple bypas 2017  CORONARY STENT INTERVENTION N/A 11/26/2019   Procedure: CORONARY STENT INTERVENTION;  Surgeon: Mady Bruckner, MD;  Location: [facility] INVASIVE CV LAB;  Service: Cardiovascular;  Laterality: N/A;   ESOPHAGOGASTRODUODENOSCOPY N/A 11/19/2023   Procedure: EGD (ESOPHAGOGASTRODUODENOSCOPY);  Surgeon: Stacia Glendia BRAVO, MD;  Location: [facility] ENDOSCOPY;  Service: Gastroenterology;  Laterality: N/A;   FEMORAL-POPLITEAL BYPASS GRAFT Right 12/25/2019   Procedure: BYPASS GRAFT FEMORAL- BELOW KNEE POPLITEAL ARTERY RIGHT USING GORE PROPATEN VASCULAR GRAFT;  Surgeon: Eliza Bruckner RAMAN, MD;  Location: [facility] OR;  Service: Vascular;  Laterality: Right;   FEMORAL-POPLITEAL BYPASS GRAFT Right 09/21/2022   Procedure: REDO RIGHT LOWER EXTREMITY BYPASS USING GORE PROPATEN 6mm REMOVALBLE RING GRAFT;  Surgeon: Eliza Bruckner RAMAN, MD;  Location: [facility] OR;  Service: Vascular;  Laterality: Right;   INTRAOPERATIVE ARTERIOGRAM Right 09/21/2022   Procedure: INTRA OPERATIVE ARTERIOGRAM;  Surgeon: Eliza Bruckner RAMAN, MD;  Location: [facility] OR;  Service: Vascular;  Laterality: Right;   LEFT HEART CATH AND CORS/GRAFTS ANGIOGRAPHY N/A 11/26/2019   Procedure: LEFT HEART CATH AND CORS/GRAFTS ANGIOGRAPHY;  Surgeon: Mady Bruckner, MD;  Location: [facility] INVASIVE CV LAB;  Service: Cardiovascular;  Laterality: N/A;   LEFT HEART CATH AND CORS/GRAFTS ANGIOGRAPHY N/A 12/01/2020   Procedure: LEFT HEART CATH AND CORS/GRAFTS ANGIOGRAPHY;  Surgeon: Burnard Debby LABOR, MD;  Location: [facility] INVASIVE CV LAB;  Service: Cardiovascular;  Laterality: N/A;    LEFT HEART CATH AND CORS/GRAFTS ANGIOGRAPHY N/A 06/16/2023   Procedure: LEFT HEART CATH AND CORS/GRAFTS ANGIOGRAPHY;  Surgeon: Jordan, Peter M, MD;  Location: [facility] INVASIVE CV LAB;  Service: Cardiovascular;  Laterality: N/A;   LOWER EXTREMITY ANGIOGRAM Right 12/25/2019   Procedure: RIGHT LOWER EXTREMITY ANGIOGRAM;  Surgeon: Eliza Bruckner RAMAN, MD;  Location: [facility] OR;  Service: Vascular;  Laterality: Right;   PERIPHERAL VASCULAR BALLOON ANGIOPLASTY Left 12/07/2019   Procedure: PERIPHERAL VASCULAR BALLOON ANGIOPLASTY;  Surgeon: Eliza Bruckner RAMAN, MD;  [facility] INVAS[facility] CV LAB;  Service: Cardiovascular;  Laterality: Left;  SFA   PERIPHERAL VASCULAR THROMBECTOMY  01/31/2024   Procedure: PERIPHERAL VASCULAR THROMBECTOMY;  Surgeon: Serene Gaile ORN, MD;  Location: [facility] INVASIVE CV LAB;  Service: Cardiovascular;;     Social History:   reports that he has been smoking cigars and cigarettes. He has been exposed to tobacco smoke. He has never used smokeless tobacco. He reports current drug use. Drugs: Marijuana and Cocaine. He reports that he does not drink alcohol.   Family History:  His family history includes Heart disease in his father, mother, and sister.   Allergies No Known Allergies   Home Medications  Prior to Admission medications   Medication Sig Start Date End Date Taking? Authorizing Provider  amLODipine  (NORVASC ) 5 MG tablet Take 1 tablet (5 mg total) by mouth daily. 12/12/23 03/11/24 Yes Fountain, Madison L, NP  aspirin  325 MG tablet Take 325 mg by mouth daily.   Yes [provider]  atorvastatin  (LIPITOR ) 80 MG tablet Take 1 tablet (80 mg total) by mouth daily. 11/06/23  Yes Dean Clarity, MD  carvedilol  (COREG ) 6.25 MG tablet Take 1 tablet (6.25 mg total) by mouth 2 (two) times daily with a meal. 11/06/23  Yes Dean Clarity, MD  gabapentin  (NEURONTIN ) 300 MG capsule Take 1 capsule (300 mg total) by mouth 3 (three) times daily. Patient taking differently: Take 300 mg  by mouth 3 (three) times daily as needed (neuropathy, pain). 11/06/23  Yes Dean Clarity, MD  isosorbide  mononitrate (IMDUR ) 120 MG 24 hr tablet Take 1 tablet (120 mg total) by mouth daily.  Patient taking differently: Take 120 mg by mouth 2 (two) times daily. 11/06/23  Yes Dean Clarity, MD  losartan  (COZAAR ) 50 MG tablet Take 1 tablet (50 mg total) by mouth daily. 11/06/23  Yes Dean Clarity, MD  nicotine  (NICODERM CQ  - DOSED IN MG/24 HOURS) 14 mg/24hr patch Place 1 patch (14 mg total) onto the skin daily. 11/19/23  Yes [name], [name], DO  ranolazine  (RANEXA ) 1000 MG SR tablet Take 1 tablet (1,000 mg total) by mouth 2 (two) times daily. Patient taking differently: Take 1,000 mg by mouth daily. 11/06/23  Yes Dean Clarity, MD  rivaroxaban  (XARELTO ) 20 MG TABS tablet Take 1 tablet (20 mg total) by mouth daily with supper. 12/12/23  Yes Fountain, Madison L, NP  cilostazol  (PLETAL ) 100 MG tablet Take 1 tablet (100 mg total) by mouth 2 (two) times daily. Patient not taking: Reported on 12/12/2023 11/06/23   Dean Clarity, MD  nicotine  polacrilex (NICORETTE ) 4 MG gum Take 1 each (4 mg total) by mouth as needed for smoking cessation. Patient not taking: Reported on 01/31/2024 12/01/23   Kennyth Domino, FNP  nitroGLYCERIN  (NITROSTAT ) 0.4 MG SL tablet Place 1 tablet (0.4 mg total) under the tongue every 5 (five) minutes as needed for chest pain. Patient not taking: Reported on 01/31/2024 12/12/23   Rana Lum CROME, NP     Warren Shade, DNP, AGACNP-BC [facility]  Please see [email or web address] for pager details.  From 7A-7P if no response, please call [phone]. After hours, please call ELink [phone].

## 2024-02-02 NOTE — Discharge Summary (Incomplete)
 Physician Discharge Summary         Patient ID: JARAD BARTH MRN: [id number] DOB/AGE: Jun 07, 1965 58 y.o.  Admit date: 01/30/2024 Discharge date: 02/02/2024  Discharge Diagnoses:    Active Hospital Problems   Diagnosis Date Noted   Polysubstance abuse (HCC) 01/31/2024   Cocaine abuse (HCC) 06/16/2023   Unhoused person 06/16/2023   Right leg pain 03/23/2023   PAD (peripheral artery disease) 09/21/2022   Tobacco use disorder     Resolved Hospital Problems   Diagnosis Date Noted Date Resolved   Critical limb ischemia of right lower extremity (HCC) 01/30/2024 02/02/2024   Shortness of breath  02/02/2024   Atypical chest pain 05/07/2017 02/02/2024      Discharge summary   This 58 year old gentleman who has a history of significant coronary artery disease hypertension and peripheral arterial disease presents to vascular surgery team with right leg pain. He had multiple failed bypass grafts in the past. He was emergently taken to the OR and is found to have occluded right femoropopliteal bypass graft. This was successfully crossed and mechanical thrombectomy was performed. The graft still remained occluded therefore EKOS catheter was left for overnight thrombolytic therapy.  Patient was taken for RLE fem-pop bypass lysis, relining with Vascular 12/3. PCCM consulted for ICU admission.   Patient is medically ready to discharge home. He is ambulating w/ assistive device, eating, stooling, at baseline.  Discharge Plan by Active Problems   CAD status post CABG Hypertension Tobacco use disorder Severe peripheral vascular disease s/p r fem-pop bypass History of congestive heart failure with midrange ejection fraction History of substance use disorder -cocaine CKD stage III creatinine at baseline 1.39 -Okay to restart home antihypertensives; avoid hypotension -Continue home Lipitor  -Continue home Ranexa /Xarelto  -Start daily Aspirin  81mg  -Encouraged smoking/drug cessation or he  will likely require limb amputation in the future if he does not -Follow up Vascular surgery in 83-month  PT eval: -Use walker when ambulating -Provided patient with bedside commode  Dispo: -Home; case mgmt to provide resources for heat; substance abuse   Significant Hospital tests/ studies  CT A/P: incidental finding -Infrarenal abdominal aortic aneurysm measuring 3.2 cm. Recommend follow-up every 3 years.  Procedures   12/2: RLE angiogram w/ thrombectomy; EKOS 12/3: right lower extremity bypass lysis, relining   Discharge Exam: BP (!) 135/96 (BP Location: Right Arm)   Pulse (!) 102   Temp 100.1 F (37.8 C) (Oral)   Resp (!) 21   Ht 6' 1 (1.854 m)   Wt 84.3 kg   SpO2 96%   BMI 24.52 kg/m   General: well-developed male lying in bed, in NAD HEENT: AT/[place], PERRL,  Pulm: normal inspiratory and expiratory effort on RA, coarse breath sounds CV: RRR, no m/g/r GI: soft, non distended, non tender to palpation Extremities: Right lower extremity very tender to touch.  Warm.  Doppler DP/PT pulses BLE present. Groin site hemostatic/soft Neuro: A&O x3, no focal deficits  Labs at discharge   Lab Results  Component Value Date   CREATININE 1.14 02/02/2024   BUN 8 02/02/2024   NA 134 (L) 02/02/2024   K 3.7 02/02/2024   CL 103 02/02/2024   CO2 24 02/02/2024   Lab Results  Component Value Date   WBC 6.9 02/02/2024   HGB 11.7 (L) 02/02/2024   HCT 35.4 (L) 02/02/2024   MCV 90.3 02/02/2024   PLT 187 02/02/2024   Lab Results  Component Value Date   ALT 9 11/18/2023   AST 12 (L) 11/18/2023  ALKPHOS 55 11/18/2023   BILITOT 0.3 11/18/2023   Lab Results  Component Value Date   INR 2.0 (H) 11/18/2023   INR 1.0 09/21/2022   INR 1.0 12/25/2019    Current radiological studies    PERIPHERAL VASCULAR CATHETERIZATION Result Date: 02/01/2024 Patient name: SUFIAN RAVI MRN: [id number] DOB: May 15, 1965 Sex: male 01/30/2024 - 02/01/2024 Pre-operative Diagnosis: Right lower  extremity acute on chronic limb ischemia, currently with lysis catheter Post-operative diagnosis:  Same Surgeon:  Fonda FORBES Rim, MD Procedure Performed: 1.  Lysis catheter recheck 2.  Right lower extremity angiography 3.  Right femoral to below-knee popliteal artery bypass stenting -realigned with 6 mm x 25 cm, 6 mm x 15 cm, 6 mm x 15 cm Viabahn stents 4.  Balloon venoplasty tibioperoneal trunk 3 x 60 mm 5.  Balloon venoplasty peroneal artery 3 x 60 mm 6.  Devices to closure-Pro-glide Indications: Patient is a 58 year old male who presented with a several week history of right lower extremity rest pain with small ulcerations at the toes.  He underwent diagnostic angiogram yesterday resulting in lysis catheter placement.  He presents today for recheck. Findings: Recanalization of the right femoral to below-knee popliteal artery bypass with pharmacomechanical thrombolysis Residual thrombus at the proximal, mid, and distal portions of the bypass graft.  Patent tibioperoneal trunk with 90% flow-limiting stenosis at the proximal portion of the peroneal and posterior tibial arteries.  Two-vessel runoff to the foot.  Procedure:  The patient was identified in the holding area and taken to room 8.  The patient was then placed supine on the table and prepped and draped in the usual sterile fashion.  A time out was called. The case began with removal of the EKOS ultrasound wire.  This was exchanged for a 0.018 wire.  The patient was heparinized and angiography followed.  See results above.  I elected to realign the previous bypass due to residual thrombus present within the bypass graft.  6 mm Gore Viabahn was used.  A 6 mm x 25 cm, 6 mm x 15 cm, 6 cm x 15 cm.  The entirety of the bypass was realigned.  Follow-up angiography demonstrated thrombus in the tibioperoneal trunk which embolized.  I elected to use a balloon to see if I could improve the flow lumen.  A 3 x 60 mm balloon was brought onto the field and expanded.  I  then used the same balloon into the peroneal artery.  Follow-up angiography demonstrated excellent result with resolution of flow-limiting stenosis in the tibioperoneal trunk and peroneal artery.  There was still flow-limiting stenosis at the posterior tibial artery.  I attempted recanalizing the artery, but had trouble entering.  On follow-up angiography, there was residual thrombus which had again lodged at the tibioperoneal trunk, likely from wire manipulation.  I used the 3 x 60 mm balloon again with resolution of flow-limiting stenosis. Wires and catheters were removed.  The left groin was closed with a ProGlide device without issue. Impression: Successful pharmacomechanical thrombolysis of the right femoral to below-knee popliteal artery bypass.  Relined with 6 mm Gore Viabahn stents with tibioperoneal trunk, peroneal artery outflow balloon plastied 3 x 60. Fonda FORBES Rim MD [facility] [place] Office: [phone]   PERIPHERAL VASCULAR CATHETERIZATION Result Date: 01/31/2024 Images from the original result were not included. Patient name: JUDAH CARCHI MRN: [id number] DOB: 08-Aug-1965 Sex: male 01/31/2024 Pre-operative Diagnosis: Right leg wrist pain Post-operative diagnosis:  Same Surgeon:  Malvina New Procedure Performed:  1.  Ultrasound-guided access, left femoral artery  2.  Aortobifemoral angiogram  3.  Right leg angiogram  4.  Selective injection with catheter in the right extrailiac artery and right popliteal artery  5.  Mechanical thrombectomy of right femoral-popliteal bypass graft  6.  Placement of catheter for infusion of thrombolytic therapy  7.  Conscious sedation, 66 minutes Indications: This is a 58 year old gentleman with right leg rest pain.  He has had multiple failed bypass grafts.  He is here today for angiogram and possible intervention. Procedure:  The patient was identified in the holding area and taken to room 8.  The patient was then placed supine on the  table and prepped and draped in the usual sterile fashion.  A time out was called.  Conscious sedation was administered with the use of IV fentanyl  and Versed  under continuous physician and nurse monitoring.  Heart rate, blood pressure, and oxygen saturation were continuously monitored.  Total sedation time was 66 minutes.  Ultrasound was used to evaluate the left common femoral artery.  It was patent .  A digital ultrasound image was acquired.  A micropuncture needle was used to access the left common femoral artery under ultrasound guidance.  An 018 wire was advanced without resistance and a micropuncture sheath was placed.  The 018 wire was removed and a benson wire was placed.  The micropuncture sheath was exchanged for a 5 french sheath.  An omniflush catheter was advanced over the wire to the level of L-1.  An abdominal angiogram was obtained.  Next, using the omniflush catheter and a benson wire, the aortic bifurcation was crossed and the catheter was placed into theright external iliac artery and right runoff was obtained.  Findings:  Aortogram: No significant renal artery stenosis.  The infrarenal abdominal aorta is widely patent.  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  Dimensions of the aneurysm cannot be determined from angiography.  Bilateral common and external iliac arteries are widely patent.  Bilateral common femorals are widely patent.  Right Lower Extremity: The right common femoral and profundofemoral artery are widely patent.  There is a stump of an occluded bypass graft.  There is reconstitution of the diseased below-knee popliteal artery and runoff via the posterior tibial and peroneal artery which were diffusely diseased. Intervention: After the above images were acquired the decision was made to attempt intervention.  A 6 French 45 cm sheath was advanced in the right external iliac artery.  The patient was fully heparinized.  Heparin  levels were checked with ACT measurements.  Selective injections were performed  with the cath in the right external iliac artery to better define the anatomy.  Using a Berenstein 2 catheter and a Glidewire advantage I was able to gain access into the bypass graft.  The wire and catheter went down fairly easily.  I was able to reenter into the below-knee popliteal artery.  This was confirmed with a selective injection in the below-knee popliteal artery.  I then replaced the Glidewire advantage and remove the catheter.  A CAT 6 penumbra thrombectomy device was then inserted and mechanical thrombectomy of the bypass graft was performed.  2 passes down and back were performed.  There was a decent amount of thrombus  evacuated.  I then performed completion imaging.  The bypass graft was not patent at this time.  I contemplated aligning this with Viabahn stents however I elected to run him on tPA overnight.  He will be brought back tomorrow for follow-up study.  He does  have a history of a GI bleed several months ago. Impression:  #1  Occluded right femoral-popliteal bypass graft, successfully crossed.  Mechanical thrombectomy was performed but the graft remains occluded and so a EKOS catheter was left for overnight thrombolytic therapy  #2  Patient be brought back tomorrow for follow-up study. ALONSO Malvina New, M.D., FACS [facility]: [phone] Pager:  [phone]    Disposition:        Allergies as of 02/02/2024   No Known Allergies   Med Rec must be completed prior to using this [facility]***        Follow-up appointment   31-month with vascular surgery Discharge Condition:    good  Warren Shade, DNP, AGACNP-BC [facility]  Please see [email or web address] for pager details.  From 7A-7P if no response, please call [phone]. After hours, please call ELink [phone].

## 2024-02-02 NOTE — Progress Notes (Signed)
 Patient seen and examined at bedside. Hematoma, small 2 x 2 cm after walking around.  I elected to pop the suture placed yesterday to drain the area.  No concern for active bleeding, no concern for expanding hematoma.  Heparin  reversed until ultrasound obtained. Will get ultrasound, but do not expect pseudoaneurysm.  Fonda FORBES Rim MD

## 2024-02-02 NOTE — Progress Notes (Signed)
 LLE pseudoaneurysm duplex has been completed.   Results can be found under chart review under CV PROC. 02/02/2024 3:10 PM Shawanda Sievert RVT, RDMS

## 2024-02-02 NOTE — Progress Notes (Addendum)
 PHARMACY - ANTICOAGULATION CONSULT NOTE  Pharmacy Consult for heparin > xarelto  Indication: Ischemic limb  No Known Allergies  Patient Measurements: Height: 6' 1 (185.4 cm) Weight: 84.3 kg (185 lb 13.6 oz) IBW/kg (Calculated) : 79.9 HEPARIN  DW (KG): 101.3  Vital Signs: Temp: 100.1 F (37.8 C) (12/04 0806) Temp Source: Oral (12/04 0806) BP: 135/96 (12/04 0800) Pulse Rate: 102 (12/04 0800)  Labs: Recent Labs    01/30/24 1337 01/30/24 1553 01/31/24 0201 01/31/24 1534 02/01/24 0648 02/01/24 0754 02/02/24 0247 02/02/24 0530 02/02/24 0953  HGB 15.2  --  13.5   < > 13.0 13.1 11.7*  --   --   HCT 47.2  --  41.2   < > 39.9 40.2 35.4*  --   --   PLT 237  --  235   < > 212 211 187  --   --   HEPARINUNFRC  --   --  0.83*   < > 0.25* 0.18* <0.10*  --  0.33  CREATININE 1.48*  --  1.39*  --  1.08  --   --  1.14  --   TROPONINIHS 19* 33*  --   --   --   --   --   --   --    < > = values in this interval not displayed.    Estimated Creatinine Clearance: 79.8 mL/min (by C-G formula based on SCr of 1.14 mg/dL).  Assessment: 41 YOM presenting with foot pain, hx severe PAD s/p bypass prescribed Xarelto  PTA however non adherent.  -s/p mechanical thrombectomy of right femoral-popliteal bypass graft with alteplase infusion (started 12/2) -s/p relook 12/3 - successful pharmacomechanical thrombylysis of bypass  Discussued with Dr. Lanis, plans are to start Xarelto  20mg  po daily with ASA 81mg /day. No plans for Xarelto  loading  Goal of Therapy:  Heparin  level 0.3-0.5 units/ml Monitor platelets by anticoagulation protocol: Yes   Plan:  -Discontinue heparin  at noon and start xarelto  20mg  po daily  Prentice Poisson, PharmD Clinical Pharmacist **Pharmacist phone directory can now be found on [email or web address] ([email or web address]).  Listed under [facility].  Addendum -He is noted with small hematoma and protamine  givern -Plans to restart heparin  at 8pm tonight and restart Xarelto  12/5  Plan -Restart  heparin  1100 units/hr at 8pm -heparin  level in 8 hrs -Change heparin  level goal to 0.3-0.5  Prentice Poisson, PharmD Clinical Pharmacist **Pharmacist phone directory can now be found on [email or web address] ([email or web address]).  Listed under [facility].

## 2024-02-02 NOTE — Progress Notes (Signed)
 PHARMACY - ANTICOAGULATION CONSULT NOTE  Pharmacy Consult for heparin  Indication: Ischemic limb  No Known Allergies  Patient Measurements: Height: 6' 1 (185.4 cm) Weight: 84.3 kg (185 lb 13.6 oz) IBW/kg (Calculated) : 79.9 HEPARIN  DW (KG): 101.3  Vital Signs: Temp: 98.9 F (37.2 C) (12/03 2345) Temp Source: Oral (12/03 2345) BP: 154/108 (12/04 0300) Pulse Rate: 91 (12/04 0300)  Labs: Recent Labs    01/30/24 1337 01/30/24 1553 01/31/24 0201 01/31/24 1534 02/01/24 0648 02/01/24 0754 02/02/24 0247  HGB 15.2  --  13.5   < > 13.0 13.1 11.7*  HCT 47.2  --  41.2   < > 39.9 40.2 35.4*  PLT 237  --  235   < > 212 211 187  HEPARINUNFRC  --   --  0.83*   < > 0.25* 0.18* <0.10*  CREATININE 1.48*  --  1.39*  --  1.08  --   --   TROPONINIHS 19* 33*  --   --   --   --   --    < > = values in this interval not displayed.    Estimated Creatinine Clearance: 84.3 mL/min (by C-G formula based on SCr of 1.08 mg/dL).  Assessment: 29 YOM presenting with foot pain, hx severe PAD s/p bypass prescribed Xarelto  PTA however non adherent.  -s/p mechanical thrombectomy of right femoral-popliteal bypass graft with alteplase infusion (started 12/2) -s/p relook 12/3 - successful pharmacomechanical thrombylysis of bypass -Per Dr. Lanis, okay to restart heparin  at 8pm with regular goal  -AM: heparin  level undetectable on 800 units/hr (~7h). Per RN, no issues with heparin  gtt running continuously or s/sx of bleeding. Per notes, plans for discharge on 12/4 pending improvement   Goal of Therapy:  Heparin  level 0.3-0.7 units/ml Monitor platelets by anticoagulation protocol: Yes   Plan:  Increase heparin  to 1100 units/hr F/u 8 hr heparin  level and daily  F/u ability to switch back to Xarelto   Lynwood Poplar, PharmD, BCPS Clinical Pharmacist 02/02/2024 3:28 AM

## 2024-02-02 NOTE — Progress Notes (Signed)
 RN palpated patient's groin site at 1619 and noted a pinky nail sized mass at the site of previous catheter insertion. Vascular surgery and CCM provider notified at 1621. RN held groin pressure for a total of 35 minutes to achieve hemostasis. Palpated mass no longer detectable after holding pressure. Vascular and CCM providers notified of change.

## 2024-02-02 NOTE — Evaluation (Signed)
 Physical Therapy Evaluation Patient Details Name: Benjamin Espinoza MRN: [id number] DOB: 02/18/1966 Today's Date: 02/02/2024  History of Present Illness  58 yo male presenting 12/1 with CP and R LE ischemia workup. S/p RLE angiography, R femoral popliteal artery bypass stenting 12/03. PMH: HTN, HLD, PVD, CVA (8/19 acute punctate L side lesion) CAD, CABG(2017) GERD, cocaine abuse, smoker, x2 bypass RLE 2024,DDD  Clinical Impression  Pt admitted with above diagnosis, evaluated following the above surgical procedure. Previously Mod I using a SPC with an aide to assist 7x/week. Able to transfer and ambulate with supervision using a RW today. Distance limited by pain Lt groin discomfort. Stands unsupported but more stable with AD. Educated on safety, recommended frequent mobilization with AD. Agrees that he would benefit from OPPT follow-up which he has done in the past and felt was beneficial. Will follow and progress acutely. Pt currently with functional limitations due to the deficits listed below (see PT Problem List). Pt will benefit from acute skilled PT to increase their independence and safety with mobility to allow discharge.           If plan is discharge home, recommend the following: Assistance with cooking/housework;A little help with bathing/dressing/bathroom;Assist for transportation;Help with stairs or ramp for entrance   Can travel by private vehicle        Equipment Recommendations Rolling walker (2 wheels);BSC/3in1  Recommendations for Other Services       Functional Status Assessment Patient has had a recent decline in their functional status and demonstrates the ability to make significant improvements in function in a reasonable and predictable amount of time.     Precautions / Restrictions Precautions Precautions: Fall Recall of Precautions/Restrictions: Intact Restrictions Weight Bearing Restrictions Per Provider Order: No      Mobility  Bed Mobility Overal bed  mobility: Needs Assistance Bed Mobility: Supine to Sit     Supine to sit: Supervision     General bed mobility comments: supervision for safety, extra time due to discomfort Lt groin    Transfers Overall transfer level: Needs assistance Equipment used: Rolling walker (2 wheels) Transfers: Sit to/from Stand Sit to Stand: Supervision           General transfer comment: Supervision for safety, slow and guarded but stable with RW to steady. Cues for hand placement. RW adjusted for adequate use.    Ambulation/Gait Ambulation/Gait assistance: Supervision Gait Distance (Feet): 40 Feet Assistive device: Rolling walker (2 wheels) Gait Pattern/deviations: Step-to pattern, Decreased stride length, Antalgic   Gait velocity interpretation: <1.31 ft/sec, indicative of household ambulator   General Gait Details: Moderately antalgic pattern. Educated on safe AD use and sequencing to maneuver safely with RW. No overt buckling noted. Using UEs appropriately to unload for comfort. Declines further distance due to pain. Tolerated prolonged period of standing without safety concern while staff changed sheets.  Stairs Stairs:  (Declined at this time. Reviewed technique with pt and demonstrated.)          Wheelchair Mobility     Tilt Bed    Modified Rankin (Stroke Patients Only)       Balance Overall balance assessment: Needs assistance Sitting-balance support: No upper extremity supported, Feet supported Sitting balance-Leahy Scale: Normal     Standing balance support: No upper extremity supported Standing balance-Leahy Scale: Fair Standing balance comment: Stood to urinate in toilet without UE support but more stable with RW.  Pertinent Vitals/Pain Pain Assessment Pain Assessment: 0-10 Pain Score: 7  Pain Location: L groin & R hamstring Pain Descriptors / Indicators: Discomfort, Grimacing Pain Intervention(s): Limited activity  within patient's tolerance, Monitored during session, Repositioned, Premedicated before session    Home Living Family/patient expects to be discharged to:: Group home Living Arrangements: Non-relatives/Friends Available Help at Discharge: Personal care attendant (7 days a week, 3-4 hours daily) Type of Home: Group Home Home Access: Stairs to enter Entrance Stairs-Rails:  (unsure) Entrance Stairs-Number of Steps: 2-3   Home Layout: One level Home Equipment: Cane - single point      Prior Function Prior Level of Function : Needs assist             Mobility Comments: Use of SPC for mobility ADLs Comments: pt reports aid assists with LB ADLs and showers     Extremity/Trunk Assessment   Upper Extremity Assessment Upper Extremity Assessment: Defer to OT evaluation    Lower Extremity Assessment Lower Extremity Assessment: Generalized weakness       Communication   Communication Communication: No apparent difficulties    Cognition Arousal: Alert Behavior During Therapy: Agitated, WFL for tasks assessed/performed   PT - Cognitive impairments: No apparent impairments                         Following commands: Intact       Cueing Cueing Techniques: Verbal cues     General Comments General comments (skin integrity, edema, etc.): VSS on RA throughout. Encouraged OOB frequently to mobilize.    Exercises General Exercises - Lower Extremity Ankle Circles/Pumps: AROM, Both, 10 reps, Supine   Assessment/Plan    PT Assessment Patient needs continued PT services  PT Problem List Decreased strength;Decreased range of motion;Decreased activity tolerance;Decreased balance;Decreased mobility;Pain       PT Treatment Interventions DME instruction;Gait training;Stair training;Functional mobility training;Therapeutic activities;Therapeutic exercise;Balance training;Neuromuscular re-education;Patient/family education;Modalities    PT Goals (Current goals can be  found in the Care Plan section)  Acute Rehab PT Goals Patient Stated Goal: Reduce pain PT Goal Formulation: With patient Time For Goal Achievement: 02/16/24 Potential to Achieve Goals: Good    Frequency Min 2X/week     Co-evaluation               AM-PAC PT 6 Clicks Mobility  Outcome Measure Help needed turning from your back to your side while in a flat bed without using bedrails?: None Help needed moving from lying on your back to sitting on the side of a flat bed without using bedrails?: None Help needed moving to and from a bed to a chair (including a wheelchair)?: A Little Help needed standing up from a chair using your arms (e.g., wheelchair or bedside chair)?: A Little Help needed to walk in hospital room?: A Little Help needed climbing 3-5 steps with a railing? : A Little 6 Click Score: 20    End of Session   Activity Tolerance: Patient tolerated treatment well;Patient limited by pain Patient left: in bed;with call bell/phone within reach;with nursing/sitter in room Nurse Communication: Mobility status PT Visit Diagnosis: Unsteadiness on feet (R26.81);Other abnormalities of gait and mobility (R26.89);Muscle weakness (generalized) (M62.81);Difficulty in walking, not elsewhere classified (R26.2);Pain Pain - Right/Left: Left Pain - part of body:  (groin)    Time: 9142-9077 PT Time Calculation (min) (ACUTE ONLY): 25 min   Charges:   PT Evaluation $PT Eval Low Complexity: 1 Low PT Treatments $Therapeutic Exercise: 8-22 mins PT General  Charges $$ ACUTE PT VISIT: 1 Visit         Leontine Roads, PT, DPT [facility]  Rehabilitation Services Physical Therapist Office: [phone] Website: [email or web address]   Leontine GORMAN Roads 02/02/2024, 11:22 AM

## 2024-02-02 NOTE — Progress Notes (Signed)
 hospital   Subjective status post right lower extremity bypass lysis, relining  No complaints this morning.  Physical Exam:  Palpable right posterior tibial artery Left groin soft, no issues.     Assessment/Plan:  POD #2  Recommend transition to DOAC today, up out of bed with physical therapy Home this afternoon.  We had a long discussion regarding his lifestyle changes that need to occur including smoking cessation, refraining from cocaine use, and taking his meds on a regular basis. Will schedule 1 month follow-up.   Fonda FORBES Rim 02/02/2024 8:07 AM --  Vitals:   02/02/24 0700 02/02/24 0806  BP: 123/85   Pulse: 98   Resp: 16   Temp:  100.1 F (37.8 C)  SpO2: 100%     Intake/Output Summary (Last 24 hours) at 02/02/2024 0807 Last data filed at 02/02/2024 0700 Gross per 24 hour  Intake 413.27 ml  Output 1100 ml  Net -686.73 ml     Laboratory CBC    Component Value Date/Time   WBC 6.9 02/02/2024 0247   HGB 11.7 (L) 02/02/2024 0247   HGB 14.2 04/09/2021 0904   HCT 35.4 (L) 02/02/2024 0247   HCT 41.6 04/09/2021 0904   PLT 187 02/02/2024 0247   PLT 231 04/09/2021 0904    BMET    Component Value Date/Time   NA 134 (L) 02/02/2024 0530   NA 141 04/09/2021 0904   K 3.7 02/02/2024 0530   CL 103 02/02/2024 0530   CO2 24 02/02/2024 0530   GLUCOSE 92 02/02/2024 0530   BUN 8 02/02/2024 0530   BUN 13 04/09/2021 0904   CREATININE 1.14 02/02/2024 0530   CALCIUM  9.0 02/02/2024 0530   GFRNONAA >60 02/02/2024 0530   GFRAA 99 11/13/2019 1359    COAG Lab Results  Component Value Date   INR 2.0 (H) 11/18/2023   INR 1.0 09/21/2022   INR 1.0 12/25/2019   No results found for: PTT  Antibiotics Anti-infectives (From admission, onward)    None        V. Malvina Serene CLORE, M.D., [facility] [place] Office: [phone] Pager:  [phone]

## 2024-02-02 NOTE — TOC Progression Note (Signed)
 Transition of Care [facility]) - Progression Note    Patient Details  Name: Benjamin Espinoza MRN: [id number] Date of Birth: 06-Mar-1965  [facility]) CM/SW Contact  Isaiah Public, LCSWA Phone Number: 02/02/2024, 11:12 AM  Clinical Narrative:     CSW spoke with patient at bedside. Patient reports PTA he comes from [facility] house with roommates. Patient reports needs some assistance with portable heater. CSW offered patient [facility] resources and some additional Whole foods. Patient accepted. CSW offered patient outpatient substance use treatment services resources. Patient accepted. Patient reports he will need transportation assistance/cab voucher when ready for dc.  CSW offered patient shelter resources. Patient accepted. All questions answered. No further questions reported at this time.  Expected Discharge Plan: Home/Self Care                 Expected Discharge Plan and Services In-house Referral: Clinical Social Work Discharge Planning Services: CM Consult Post Acute Care Choice: NA (Boarding home-rents a room) Living arrangements for the past 2 months: Boarding House                   DME Agency: NA       HH Arranged: NA           Social Drivers of Health (SDOH) Interventions SDOH Screenings   Food Insecurity: Food Insecurity Present (02/01/2024)  Housing: Low Risk  (02/01/2024)  Transportation Needs: Unmet Transportation Needs (02/01/2024)  Utilities: Not At Risk (02/01/2024)  Alcohol Screen: Low Risk  (03/25/2023)  Depression (PHQ2-9): Low Risk  (06/09/2023)  Recent Concern: Depression (PHQ2-9) - Medium Risk (03/23/2023)  Financial Resource Strain: High Risk (06/09/2023)  Physical Activity: Inactive (03/25/2023)  Social Connections: Socially Integrated (06/16/2023)  Stress: Stress Concern Present (11/25/2023)  Tobacco Use: High Risk (01/30/2024)  Health Literacy: Adequate Health Literacy (03/25/2023)     Readmission Risk Interventions     No data to display

## 2024-02-02 NOTE — Discharge Instructions (Addendum)
 [facility]  Discharge Instructions  Lower Extremity Angiogram; Angioplasty/Stenting  Please refer to the following instructions for your post-procedure care. Your surgeon or physician assistant will discuss any changes with you.  Activity  Avoid lifting more than 8 pounds (1 gallons of milk) for 72 hours (3 days) after your procedure. You may walk as much as you can tolerate. It's OK to drive after 72 hours.  Bathing/Showering  You may shower the day after your procedure. If you have a bandage, you may remove it at 24- 48 hours. Clean your incision site with mild soap and water. Pat the area dry with a clean towel.  Diet  Resume your pre-procedure diet. There are no special food restrictions following this procedure. All patients with peripheral vascular disease should follow a low fat/low cholesterol diet. In order to heal from your surgery, it is CRITICAL to get adequate nutrition. Your body requires vitamins, minerals, and protein. Vegetables are the best source of vitamins and minerals. Vegetables also provide the perfect balance of protein. Processed food has little nutritional value, so try to avoid this.  Medications  Resume taking all of your medications unless your doctor tells you not to. If your incision is causing pain, you may take over-the-counter pain relievers such as acetaminophen  (Tylenol )  Follow Up  Follow up will be arranged at the time of your procedure. You may have an office visit scheduled or may be scheduled for surgery. Ask your surgeon if you have any questions.  Please call us  immediately for any of the following conditions: Severe or worsening pain your legs or feet at rest or with walking. Increased pain, redness, drainage at your groin puncture site. Fever of 101 degrees or higher. If you have any mild or slow bleeding from your puncture site: lie down, apply firm constant pressure over the area with a piece of gauze  or a clean wash cloth for 30 minutes- no peeking!, call 911 right away if you are still bleeding after 30 minutes, or if the bleeding is heavy and unmanageable.  Reduce your risk factors of vascular disease:  Stop smoking. If you would like help call [facility] at 1-800-QUIT-NOW (6196121722) or [place] at 9803971305. Manage your cholesterol Maintain a desired weight Control your diabetes Keep your blood pressure down  If you have any questions, please call the office at [phone]  ------------------------------  Information on my medicine - XARELTO  (rivaroxaban )   WHY WAS XARELTO  PRESCRIBED FOR YOU? Xarelto  was prescribed to treat blood clots that  have been found in your legs (deep vein thrombosis) and to reduce the risk of them occurring again.  What do you need to know about Xarelto ? The dose is one 20 mg tablet taken ONCE A DAY with your evening meal.  DO NOT stop taking Xarelto  without talking to the health care provider who prescribed the medication.  Refill your prescription for 20 mg tablets before you run out.  After discharge, you should have regular check-up appointments with your healthcare provider that is prescribing your Xarelto .  In the future your dose may need to be changed if your kidney function changes by a significant amount.  What do you do if you miss a dose? If you are taking Xarelto  TWICE DAILY and you miss a dose, take it as soon as you remember. You may take two 15 mg tablets (total 30 mg) at the same time then resume your regularly scheduled 15 mg twice daily the next  day.  If you are taking Xarelto  ONCE DAILY and you miss a dose, take it as soon as you remember on the same day then continue your regularly scheduled once daily regimen the next day. Do not take two doses of Xarelto  at the same time.   Important Safety Information Xarelto  is a blood thinner medicine that can cause bleeding. You should call your healthcare provider  right away if you experience any of the following: Bleeding from an injury or your nose that does not stop. Unusual colored urine (red or dark brown) or unusual colored stools (red or black). Unusual bruising for unknown reasons. A serious fall or if you hit your head (even if there is no bleeding).  Some medicines may interact with Xarelto  and might increase your risk of bleeding while on Xarelto . To help avoid this, consult your healthcare provider or pharmacist prior to using any new prescription or non-prescription medications, including herbals, vitamins, non-steroidal anti-inflammatory drugs (NSAIDs) and supplements.  This website has more information on Xarelto : www.xarelto .com.

## 2024-02-02 NOTE — Evaluation (Signed)
 Occupational Therapy Evaluation Patient Details Name: Benjamin Espinoza MRN: [id number] DOB: Feb 08, 1966 Today's Date: 02/02/2024   History of Present Illness   58 yo male presenting 12/1 with CP and R LE ischemia workup. S/p RLE angiography, R femoral popliteal artery bypass stenting 12/03. PMH: HTN, HLD, PVD, CVA (8/19 acute punctate L side lesion) CAD, CABG(2017) GERD, cocaine abuse, smoker, x2 bypass RLE 2024,DDD     Clinical Impressions Pt admitted based on above, and was seen based on problem list below. Pt reports PTA pt was receiving assistance from his [facility] aid 7x/week for 3-4 hours with LB ADLs, and IADLs. Today pt is requiring set up  to mod assistance for  ADLs. Functional transfers are  CGA for balance. Pt limited to short lateral steps along EOB d/t reporting 7/10 pain. Educated pt on benefits of BSC and shower seat at d/c, pt verbalized understanding. Anticipate once pain is managed, will progress well. No follow up OT needs. OT will continue to follow acutely to maximize functional independence.     If plan is discharge home, recommend the following:   A little help with walking and/or transfers;A little help with bathing/dressing/bathroom;Assist for transportation     Functional Status Assessment   Patient has had a recent decline in their functional status and demonstrates the ability to make significant improvements in function in a reasonable and predictable amount of time.     Equipment Recommendations   BSC/3in1;Tub/shower seat      Precautions/Restrictions   Precautions Precautions: Fall Recall of Precautions/Restrictions: Intact Restrictions Weight Bearing Restrictions Per Provider Order: No     Mobility Bed Mobility Overal bed mobility: Needs Assistance Bed Mobility: Sit to Supine       Sit to supine: Supervision   General bed mobility comments: S for safety with lines, pt moaning d/t pain    Transfers Overall transfer level: Needs  assistance Equipment used: Straight cane Transfers: Sit to/from Stand Sit to Stand: Contact guard assist           General transfer comment: CGA for balance to stand and take lateral steps along EOB. Pt limited by pain, maintaining flexed posture      Balance Overall balance assessment: Needs assistance Sitting-balance support: No upper extremity supported, Feet supported Sitting balance-Leahy Scale: Normal     Standing balance support: No upper extremity supported Standing balance-Leahy Scale: Fair Standing balance comment: Benefits from UE support       ADL either performed or assessed with clinical judgement   ADL Overall ADL's : Needs assistance/impaired Eating/Feeding: Set up;Sitting   Grooming: Wash/dry face;Oral care;Set up;Sitting   Upper Body Bathing: Set up;Sitting   Lower Body Bathing: Moderate assistance;Sit to/from stand   Upper Body Dressing : Set up;Sitting   Lower Body Dressing: Moderate assistance;Sit to/from stand       Functional mobility during ADLs: Rolling walker (2 wheels) General ADL Comments: Limited d/t pain, declining attempts at mobility     Vision Baseline Vision/History: 0 No visual deficits Vision Assessment?: No apparent visual deficits            Pertinent Vitals/Pain Pain Assessment Pain Assessment: 0-10 Pain Score: 7  Pain Location: L groin & R hamstring Pain Descriptors / Indicators: Discomfort, Grimacing Pain Intervention(s): Limited activity within patient's tolerance     Extremity/Trunk Assessment Upper Extremity Assessment Upper Extremity Assessment: Generalized weakness   Lower Extremity Assessment Lower Extremity Assessment: Defer to PT evaluation       Communication Communication Communication: No apparent difficulties  Cognition Arousal: Alert Behavior During Therapy: Flat affect Cognition: No apparent impairments       OT - Cognition Comments: Mild processing delays likely baseline        Following commands: Intact       Cueing  General Comments   Cueing Techniques: Verbal cues  VSS on RA           Home Living Family/patient expects to be discharged to:: Group home Living Arrangements: Non-relatives/Friends Available Help at Discharge: Personal care attendant (7 days a week, 3-4 hours daily) Type of Home: Group Home Home Access: Stairs to enter Entrance Stairs-Number of Steps: 2 Entrance Stairs-Rails:  (unsure) Home Layout: One level     Bathroom Shower/Tub: Producer, Television/film/video: Standard     Home Equipment: Cane - single point          Prior Functioning/Environment Prior Level of Function : Needs assist             Mobility Comments: Use of SPC for mobility ADLs Comments: pt reports aid assists with LB ADLs and showers    OT Problem List: Decreased strength;Decreased range of motion;Decreased activity tolerance;Impaired balance (sitting and/or standing);Decreased safety awareness;Decreased knowledge of use of DME or AE;Decreased knowledge of precautions   OT Treatment/Interventions: Self-care/ADL training;Therapeutic exercise;Energy conservation;DME and/or AE instruction;Therapeutic activities;Patient/family education;Balance training      OT Goals(Current goals can be found in the care plan section)   Acute Rehab OT Goals Patient Stated Goal: To get pain mangaged OT Goal Formulation: With patient Time For Goal Achievement: 02/16/24 Potential to Achieve Goals: Good   OT Frequency:  Min 2X/week       AM-PAC OT 6 Clicks Daily Activity     Outcome Measure Help from another person eating meals?: None Help from another person taking care of personal grooming?: A Little Help from another person toileting, which includes using toliet, bedpan, or urinal?: A Lot Help from another person bathing (including washing, rinsing, drying)?: A Lot Help from another person to put on and taking off regular upper body clothing?: A  Little Help from another person to put on and taking off regular lower body clothing?: A Lot 6 Click Score: 16   End of Session Equipment Utilized During Treatment: Other (comment) [facility]) Nurse Communication: Mobility status  Activity Tolerance: Patient limited by pain Patient left: in bed;with call bell/phone within reach;with nursing/sitter in room  OT Visit Diagnosis: Unsteadiness on feet (R26.81);Other abnormalities of gait and mobility (R26.89);Muscle weakness (generalized) (M62.81);Pain Pain - Right/Left: Left Pain - part of body:  (groin)                Time: 9072-9059 OT Time Calculation (min): 13 min Charges:  OT General Charges $OT Visit: 1 Visit OT Evaluation $OT Eval Moderate Complexity: 1 Mod  Lilac Hoff C, OT  Acute Rehabilitation Services Office [phone] Secure chat preferred   Adrianne GORMAN Savers 02/02/2024, 12:00 PM

## 2024-02-02 NOTE — Progress Notes (Addendum)
 PCCM UPDATE:  PCCM notified that patient w/ c/o pain to left groin site. Bedside POCUS per vascular, possible pseudoaneurysm, popped stitch to release small amount of blood, exam otherwise benign. Will obtain formal ultrasound and defer discharge to tomorrow. Will place transfer orders to floor and sign out to TRH 12/5.   Benjamin Avina, DNP, AGACNP-BC [facility]  Please see [email or web address] for pager details.  From 7A-7P if no response, please call [phone]. After hours, please call ELink [phone].

## 2024-02-03 ENCOUNTER — Telehealth (HOSPITAL_COMMUNITY): Payer: Self-pay

## 2024-02-03 ENCOUNTER — Other Ambulatory Visit (HOSPITAL_COMMUNITY): Payer: Self-pay

## 2024-02-03 DIAGNOSIS — Z95820 Peripheral vascular angioplasty status with implants and grafts: Secondary | ICD-10-CM

## 2024-02-03 DIAGNOSIS — Z95828 Presence of other vascular implants and grafts: Secondary | ICD-10-CM

## 2024-02-03 DIAGNOSIS — Z720 Tobacco use: Secondary | ICD-10-CM

## 2024-02-03 DIAGNOSIS — Z951 Presence of aortocoronary bypass graft: Secondary | ICD-10-CM

## 2024-02-03 DIAGNOSIS — T82868A Thrombosis of vascular prosthetic devices, implants and grafts, initial encounter: Secondary | ICD-10-CM

## 2024-02-03 DIAGNOSIS — I97638 Postprocedural hematoma of a circulatory system organ or structure following other circulatory system procedure: Secondary | ICD-10-CM

## 2024-02-03 LAB — CBC
HCT: 35.6 % — ABNORMAL LOW (ref 39.0–52.0)
Hemoglobin: 11.9 g/dL — ABNORMAL LOW (ref 13.0–17.0)
MCH: 29.8 pg (ref 26.0–34.0)
MCHC: 33.4 g/dL (ref 30.0–36.0)
MCV: 89 fL (ref 80.0–100.0)
Platelets: 213 K/uL (ref 150–400)
RBC: 4 MIL/uL — ABNORMAL LOW (ref 4.22–5.81)
RDW: 14.8 % (ref 11.5–15.5)
WBC: 8.2 K/uL (ref 4.0–10.5)
nRBC: 0 % (ref 0.0–0.2)

## 2024-02-03 LAB — HEPARIN LEVEL (UNFRACTIONATED): Heparin Unfractionated: 0.2 [IU]/mL — ABNORMAL LOW (ref 0.30–0.70)

## 2024-02-03 LAB — URIC ACID: Uric Acid, Serum: 5.3 mg/dL (ref 3.7–8.6)

## 2024-02-03 MED ORDER — HEPARIN (PORCINE) 25000 UT/250ML-% IV SOLN
1200.0000 [IU]/h | INTRAVENOUS | Status: DC
  Administered 2024-02-03: 1100 [IU]/h via INTRAVENOUS

## 2024-02-03 MED ORDER — SENNOSIDES-DOCUSATE SODIUM 8.6-50 MG PO TABS
2.0000 | ORAL_TABLET | Freq: Every day | ORAL | Status: DC
  Administered 2024-02-03: 2 via ORAL
  Filled 2024-02-03: qty 2

## 2024-02-03 MED ORDER — CARVEDILOL 3.125 MG PO TABS
3.1250 mg | ORAL_TABLET | Freq: Two times a day (BID) | ORAL | Status: DC
  Administered 2024-02-03 – 2024-02-04 (×2): 3.125 mg via ORAL
  Filled 2024-02-03 (×2): qty 1

## 2024-02-03 MED ORDER — POLYETHYLENE GLYCOL 3350 17 G PO PACK
17.0000 g | PACK | Freq: Two times a day (BID) | ORAL | Status: DC
  Administered 2024-02-03 – 2024-02-04 (×3): 17 g via ORAL
  Filled 2024-02-03 (×3): qty 1

## 2024-02-03 MED ORDER — ISOSORBIDE MONONITRATE ER 30 MG PO TB24
90.0000 mg | ORAL_TABLET | Freq: Every day | ORAL | Status: DC
  Administered 2024-02-03 – 2024-02-04 (×2): 90 mg via ORAL
  Filled 2024-02-03 (×2): qty 3

## 2024-02-03 MED ORDER — GABAPENTIN 100 MG PO CAPS
100.0000 mg | ORAL_CAPSULE | Freq: Three times a day (TID) | ORAL | Status: DC
  Administered 2024-02-03 – 2024-02-04 (×2): 100 mg via ORAL
  Filled 2024-02-03 (×3): qty 1

## 2024-02-03 NOTE — Progress Notes (Signed)
 Physical Therapy Treatment Patient Details Name: Benjamin Espinoza MRN: [id number] DOB: 27-Jul-1965 Today's Date: 02/03/2024   History of Present Illness 58 yo male presenting 12/1 with CP and R LE ischemia workup. S/p RLE angiography, R femoral popliteal artery bypass stenting 12/03. PMH: HTN, HLD, PVD, CVA (8/19 acute punctate L side lesion) CAD, CABG(2017) GERD, cocaine abuse, smoker, x2 bypass RLE 2024,DDD    PT Comments  Pt is progressing towards goals slowly. Pt is limited by pain. Currently pt is mod I for bed mobility, supervision to CGA for sit to stand/gait with RW. Pt declined navigating stairs today though encouraged; re-iterated education on stair navigation for return home. Pt stated understanding of education on stairs. Due to pt current functional status, home set up and available assistance at home recommending skilled physical therapy services 3x/week in order to address strength, balance and functional mobility to decrease risk for falls, injury and re-hospitalization.       If plan is discharge home, recommend the following: Assistance with cooking/housework;A little help with bathing/dressing/bathroom;Assist for transportation;Help with stairs or ramp for entrance     Equipment Recommendations  Rolling walker (2 wheels);BSC/3in1       Precautions / Restrictions Precautions Precautions: Fall Recall of Precautions/Restrictions: Intact Restrictions Weight Bearing Restrictions Per Provider Order: No     Mobility  Bed Mobility Overal bed mobility: Modified Independent Bed Mobility: Supine to Sit, Sit to Supine     Supine to sit: Modified independent (Device/Increase time) Sit to supine: Modified independent (Device/Increase time)   General bed mobility comments: pt with increased time due to pain; requries no supervision or physical assist.    Transfers Overall transfer level: Needs assistance Equipment used: Rolling walker (2 wheels) Transfers: Sit to/from  Stand Sit to Stand: Supervision           General transfer comment: supervision for safety due to pain; denies dizziness.    Ambulation/Gait Ambulation/Gait assistance: Supervision Gait Distance (Feet): 80 Feet Assistive device: Rolling walker (2 wheels) Gait Pattern/deviations: Step-to pattern, Decreased stride length, Antalgic, Step-through pattern, Decreased dorsiflexion - right, Decreased dorsiflexion - left, Trunk flexed Gait velocity: decreased Gait velocity interpretation: 1.31 - 2.62 ft/sec, indicative of limited community ambulator   General Gait Details: decreased heel toe gait pattern; fleced posture, short step to to partial step through gait pattern initially. Improved with cues for larger steps, heel toe gait pattern   Stairs Stairs:  (pt declined, discussed technique.)             Balance Overall balance assessment: Mild deficits observed, not formally tested Sitting-balance support: No upper extremity supported, Feet supported Sitting balance-Leahy Scale: Normal     Standing balance support: No upper extremity supported Standing balance-Leahy Scale: Fair Standing balance comment: Benefits from UE support        Communication Communication Communication: No apparent difficulties  Cognition Arousal: Alert Behavior During Therapy: WFL for tasks assessed/performed   PT - Cognitive impairments: No apparent impairments       Following commands: Intact      Cueing Cueing Techniques: Verbal cues     General Comments General comments (skin integrity, edema, etc.): vital signs stable on room air      Pertinent Vitals/Pain Pain Assessment Pain Assessment: Faces Faces Pain Scale: Hurts even more Pain Location: L groin & R hamstring/calf Pain Descriptors / Indicators: Discomfort, Grimacing Pain Intervention(s): Monitored during session, Limited activity within patient's tolerance, Premedicated before session, RN gave pain meds during session  PT Goals (current goals can now be found in the care plan section) Acute Rehab PT Goals Patient Stated Goal: Reduce pain PT Goal Formulation: With patient Time For Goal Achievement: 02/16/24 Potential to Achieve Goals: Good Progress towards PT goals: Progressing toward goals    Frequency    Min 2X/week      PT Plan  Continue with current POC        AM-PAC PT 6 Clicks Mobility   Outcome Measure  Help needed turning from your back to your side while in a flat bed without using bedrails?: None Help needed moving from lying on your back to sitting on the side of a flat bed without using bedrails?: None Help needed moving to and from a bed to a chair (including a wheelchair)?: A Little Help needed standing up from a chair using your arms (e.g., wheelchair or bedside chair)?: A Little Help needed to walk in hospital room?: A Little Help needed climbing 3-5 steps with a railing? : A Little 6 Click Score: 20    End of Session Equipment Utilized During Treatment: Gait belt Activity Tolerance: Patient tolerated treatment well;Patient limited by pain Patient left: in bed;with call bell/phone within reach Nurse Communication: Mobility status PT Visit Diagnosis: Unsteadiness on feet (R26.81);Other abnormalities of gait and mobility (R26.89);Muscle weakness (generalized) (M62.81);Difficulty in walking, not elsewhere classified (R26.2);Pain Pain - Right/Left: Left Pain - part of body:  (groin and HS/calf)     Time: 8673-8654 PT Time Calculation (min) (ACUTE ONLY): 19 min  Charges:    $Therapeutic Activity: 8-22 mins PT General Charges $$ ACUTE PT VISIT: 1 Visit                     [name], DPT, CLT  Acute Rehabilitation Services Office: [phone] (Secure chat preferred)    [name] 02/03/2024, 4:36 PM

## 2024-02-03 NOTE — Progress Notes (Signed)
 PHARMACY - ANTICOAGULATION CONSULT NOTE  Pharmacy Consult for heparin  (PTA Xarelto ) Indication: Ischemic limb  No Known Allergies  Patient Measurements: Height: 6' 1 (185.4 cm) Weight: 83.7 kg (184 lb 8.4 oz) IBW/kg (Calculated) : 79.9 HEPARIN  DW (KG): 101.3  Vital Signs: BP: 127/93 (12/05 0900) Pulse Rate: 83 (12/05 0900)  Labs: Recent Labs    02/01/24 0648 02/01/24 0754 02/02/24 0247 02/02/24 0530 02/02/24 0953 02/03/24 0316  HGB 13.0 13.1 11.7*  --   --  11.9*  HCT 39.9 40.2 35.4*  --   --  35.6*  PLT 212 211 187  --   --  213  HEPARINUNFRC 0.25* 0.18* <0.10*  --  0.33  --   CREATININE 1.08  --   --  1.14  --   --     Estimated Creatinine Clearance: 79.8 mL/min (by C-G formula based on SCr of 1.14 mg/dL).  Assessment: 44 YOM presenting with foot pain, hx severe PAD s/p bypass prescribed Xarelto  PTA however non adherent.  -s/p mechanical thrombectomy of right femoral-popliteal bypass graft with alteplase  infusion (started 12/2) -s/p relook 12/3 - successful pharmacomechanical thrombylysis of bypass; alteplase  discontinued  Per 12/4 discussion with Dr. Lanis, plan was to Xarelto  20mg  po daily with ASA 81mg /day (no plans for load).  Developed small hematoma (2x2cm), some bleeding from L groin when walking yesterday and heparin  was reversed.  Vascular restarted heparin  overnight but was paused @0330  after ~2h of running.    Hematoma stable today, per Dr. Magda plan to restart heparin  infusion, no bolus.  CBC stable (Hgb 11.9, pltc 213 - was ~13 pre-lytics)  Goal of Therapy:  Heparin  level 0.3-0.5 units/ml Monitor platelets by anticoagulation protocol: Yes   Plan:  -Restart heparin  IV 1100 units/hr, no bolus -6h heparin  level -Monitor groin hematoma / any new bleeding  Maurilio Fila, PharmD Clinical Pharmacist 02/03/2024  10:42 AM

## 2024-02-03 NOTE — Telephone Encounter (Signed)
 Pharmacy Patient Advocate Encounter  Insurance verification completed.    The patient is insured through [facility]. Patient has Medicare and is not eligible for a copay card, but may be able to apply for patient assistance or Medicare RX Payment Plan (Patient Must reach out to their plan, if eligible for payment plan), if available.    Ran test claim for Xarelto  20mg  tablet and the current 30 day co-pay is $0.   This test claim was processed through [employer]- copay amounts may vary at other pharmacies due to [employer], or as the patient moves through the different stages of their insurance plan.

## 2024-02-03 NOTE — Progress Notes (Signed)
 PHARMACY - ANTICOAGULATION CONSULT NOTE  Pharmacy Consult for heparin  (PTA Xarelto ) Indication: Ischemic limb  No Known Allergies  Patient Measurements: Height: 6' 1 (185.4 cm) Weight: 83.7 kg (184 lb 8.4 oz) IBW/kg (Calculated) : 79.9 HEPARIN  DW (KG): 101.3  Vital Signs: Temp: 97.2 F (36.2 C) (12/05 1100) Temp Source: Axillary (12/05 1100) BP: 99/55 (12/05 1852) Pulse Rate: 98 (12/05 1852)  Labs: Recent Labs    02/01/24 0648 02/01/24 0754 02/02/24 0247 02/02/24 0530 02/02/24 0953 02/03/24 0316 02/03/24 1758  HGB 13.0 13.1 11.7*  --   --  11.9*  --   HCT 39.9 40.2 35.4*  --   --  35.6*  --   PLT 212 211 187  --   --  213  --   HEPARINUNFRC 0.25* 0.18* <0.10*  --  0.33  --  0.20*  CREATININE 1.08  --   --  1.14  --   --   --     Estimated Creatinine Clearance: 79.8 mL/min (by C-G formula based on SCr of 1.14 mg/dL).  Assessment: 26 YOM presenting with foot pain, hx severe PAD s/p bypass prescribed Xarelto  PTA however non adherent.  -s/p mechanical thrombectomy of right femoral-popliteal bypass graft with alteplase  infusion (started 12/2) -s/p relook 12/3 - successful pharmacomechanical thrombylysis of bypass; alteplase  discontinued  Per 12/4 discussion with Dr. Lanis, plan was to Xarelto  20mg  po daily with ASA 81mg /day (no plans for load).  Developed small hematoma (2x2cm), some bleeding from L groin when walking yesterday and heparin  was reversed.  Vascular restarted heparin  overnight but was paused @0330  after ~2h of running.    Hematoma stable today, per Dr. Magda plan to restart heparin  infusion, no bolus.  CBC stable (Hgb 11.9, pltc 213 - was ~13 pre-lytics)  Heparin  drip 1100 uts/hr with heparin  level 0.2 < goal no groin bleeding noted per RN this evening  Goal of Therapy:  Heparin  level 0.3-0.5 units/ml Monitor platelets by anticoagulation protocol: Yes   Plan:  Increase heparin  IV 1200 units/hr, no bolus Daily heparin  level and CBC -Monitor groin  hematoma / any new bleeding    Olam Chalk Pharm.D. CPP, BCPS Clinical Pharmacist [phone] 02/03/2024 7:12 PM

## 2024-02-03 NOTE — Plan of Care (Signed)
   Problem: Clinical Measurements: Goal: Respiratory complications will improve Outcome: Progressing   Problem: Clinical Measurements: Goal: Cardiovascular complication will be avoided Outcome: Progressing   Problem: Clinical Measurements: Goal: Diagnostic test results will improve Outcome: Progressing   Problem: Education: Goal: Knowledge of General Education information will improve Description: Including pain rating scale, medication(s)/side effects and non-pharmacologic comfort measures Outcome: Progressing

## 2024-02-03 NOTE — Progress Notes (Addendum)
  Progress Note    02/03/2024 8:17 AM 2 Days Post-Op  Subjective:  pain in L groin this morning   Vitals:   02/03/24 0000 02/03/24 0400  BP: 116/85   Pulse: 91 85  Resp: 14 (!) 22  Temp:    SpO2: 94% 94%   Physical Exam: Lungs:  non labored Incisions:  L groin small well defined hematoma, tender to light touch Extremities:  palpable R PT and L DP Neurologic: A&O  CBC    Component Value Date/Time   WBC 8.2 02/03/2024 0316   RBC 4.00 (L) 02/03/2024 0316   HGB 11.9 (L) 02/03/2024 0316   HGB 14.2 04/09/2021 0904   HCT 35.6 (L) 02/03/2024 0316   HCT 41.6 04/09/2021 0904   PLT 213 02/03/2024 0316   PLT 231 04/09/2021 0904   MCV 89.0 02/03/2024 0316   MCV 92 04/09/2021 0904   MCH 29.8 02/03/2024 0316   MCHC 33.4 02/03/2024 0316   RDW 14.8 02/03/2024 0316   RDW 13.1 04/09/2021 0904   LYMPHSABS 2.6 11/23/2023 0944   LYMPHSABS 2.1 04/09/2021 0904   MONOABS 0.7 11/23/2023 0944   EOSABS 0.2 11/23/2023 0944   EOSABS 0.1 04/09/2021 0904   BASOSABS 0.1 11/23/2023 0944   BASOSABS 0.1 04/09/2021 0904    BMET    Component Value Date/Time   NA 134 (L) 02/02/2024 0530   NA 141 04/09/2021 0904   K 3.7 02/02/2024 0530   CL 103 02/02/2024 0530   CO2 24 02/02/2024 0530   GLUCOSE 92 02/02/2024 0530   BUN 8 02/02/2024 0530   BUN 13 04/09/2021 0904   CREATININE 1.14 02/02/2024 0530   CALCIUM  9.0 02/02/2024 0530   GFRNONAA >60 02/02/2024 0530   GFRAA 99 11/13/2019 1359    INR    Component Value Date/Time   INR 2.0 (H) 11/18/2023 0134     Intake/Output Summary (Last 24 hours) at 02/03/2024 0817 Last data filed at 02/03/2024 0600 Gross per 24 hour  Intake 523.97 ml  Output 2150 ml  Net -1626.03 ml     Assessment/Plan:  58 y.o. male is s/p RLE bypass lysis, relining of bypass with [place] 2 Days Post-Op   BLE well perfused with palpable pedal pulses.  He experienced bleeding from L groin while walking yesterday.  L groin pseudo duplex was negative.  On exam he has  a small hematoma in the L groin.  Heparin  has been held since 0330 this morning.  H&H relatively stable.  Continue bed rest for now.  Will discuss bedrest and timing of heparin  with surgeon.  ADDENDUM 12/5: Ok to restart heparin  this morning.  Ok to get OOB as well.  Dr. Magda will evaluate the patient later this afternoon.   Donnice Sender, PA-C [facility] [phone] 02/03/2024 8:17 AM  VASCULAR STAFF ADDENDUM: I have independently interviewed and examined the patient. I agree with the above.  Groin looks OK on my evaluation. Small hematoma about quarter sized. Patient understandably nervous about going home. Counseled we need to see heparin  work for him without enlarging hematoma. Restart heparin  without bolus today. If tolerates overnight can go home tomorrow with DOAC. Mobilize as able.  Debby SAILOR. Magda, MD [facility] Phone Number: [phone] 02/03/2024 11:48 AM

## 2024-02-03 NOTE — Progress Notes (Signed)
 NAME:  Benjamin Espinoza, MRN:  [id number], DOB:  06/12/1965, LOS: 4 ADMISSION DATE:  01/30/2024,  CHIEF COMPLAINT: Critical limb ischemia.  History of Present Illness:  This 58 year old gentleman who has a history of significant coronary artery disease hypertension and peripheral arterial disease presents to vascular surgery team with right leg pain.  He had multiple failed bypass grafts in the past.  He was emergently taken to the OR and is found to have occluded right femoropopliteal bypass graft.  This was successfully crossed and mechanical thrombectomy was performed.  The graft still remained occluded therefore EKOS catheter was left for overnight thrombolytic therapy.  Patient was taken for RLE fem-pop bypass lysis, relining with Vascular 12/3. PCCM consulted for ICU admission.   Pertinent  Medical History   Past Medical History:  Diagnosis Date   Abnormal stress test    Acute left ankle pain 03/23/2023   Anxiety    Asthma    as a child   Atypical chest pain 05/07/2017   Chest pain  Unstable angina 06/15/2023   Chronic bilateral low back pain with left-sided sciatica 01/11/2023   Chronic health problem 06/15/2023   Chronic pain syndrome 03/29/2023   Cocaine abuse (HCC) 06/16/2023   Coronary artery disease    DDD (degenerative disc disease), lumbar 11/08/2019   Formatting of this note might be different from the original. 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 There is mild dilatation of the ascending aorta, measuring 43 mm. There is mild dilatation of the aortic root, measuring 44 mm.      Encounter for medication monitoring 01/10/2023   Encounter for well adult exam with abnormal findings 04/08/2023   Essential hypertension    Heart failure with mid-range ejection fraction (HFmEF) (HCC) 06/17/2023   06/16/23 transthoracic echocardiogram: Left Ventricle: Left ventricular ejection fraction 45 to  50%. The left ventricle has mildly decreased function. No left ventricular hypertrophy. 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 [place]    Hyperlipidemia    Impaired mobility and ADLs 03/23/2023   Ischemic cardiomyopathy 12/17/2020   Lumbar radiculopathy 07/05/2023   Myocardial infarction [facility]) 2017   NSTEMI (non-ST elevated myocardial infarction) (HCC) 11/28/2020   OAB (overactive bladder) 01/11/2023   PAD (peripheral artery disease) 09/21/2022   PVD (peripheral vascular disease) 09/21/2019   Right leg pain 03/23/2023   Shortness of breath    Sinus bradycardia 11/29/2019   Tobacco use disorder    Unhoused person 06/16/2023   Unstable angina (HCC) 06/15/2023   Significant Hospital Events: Including procedures, antibiotic start and stop dates in addition to other pertinent events   01/31/2024 - Admitted to ICU post right lower extremity angiogram and thrombectomy. 02/01/2024 - right lower extremity bypass lysis, relining 02/02/2024 - Heparin  gtt stopped and reversed after small hematoma to left groin site evacuated at bedside per Vascular; ultrasound negative for pseudoaneurysm. Vascular to discuss w/ surgery if any intervention is warranted-->bed rest for now  Interim History / Subjective:  NAEO. Doppler DP/PT pulses present to RLE on exam. Discharge on hold in setting of increasing groin site pain/hematoma. Heparin  gtt stopped and reversed 12/4 afternoon after small hematoma to left groin site evacuated at bedside per Vascular; ultrasound negative for pseudoaneurysm. On exam this morning, groin site w/ palpable small hematoma noted; tenderness to touch. Per vascular, bed rest for now, cont to hold [facility], surgical intervention tbd.  Objective  Blood pressure (!) 156/122, pulse 89, temperature 98.7 F (37.1 C), temperature source Oral, resp. rate (!) 26, height 6' 1 (1.854 m), weight 83.7 kg, SpO2 94%.        Intake/Output Summary (Last 24 hours) at 02/03/2024  0926 Last data filed at 02/03/2024 0600 Gross per 24 hour  Intake 472.66 ml  Output 2150 ml  Net -1677.34 ml   Filed Weights   01/31/24 0540 01/31/24 1845 02/03/24 0618  Weight: 104.6 kg 84.3 kg 83.7 kg   Examination: General: well-developed male sitting at EOB, in NAD HEENT: AT/[place], PERRL,  Pulm: normal inspiratory and expiratory effort on RA, mild coarse breath sounds  CV: RRR, no m/g/r GI: soft, non distended, non tender to palpation Extremities: Right lower extremity tender to touch. Warm. Doppler DP/PT pulses BLE present. Groin site w/ small palpable hematoma; tender to touch Neuro: A&O x3, no focal deficits  Resolved problem list   Assessment and Plan  Critical limb ischemia right lower extremity CAD status post CABG Hypertension Tobacco use disorder Severe peripheral vascular disease History of congestive heart failure with midrange ejection fraction History of substance use disorder -cocaine CKD stage III creatinine at baseline 1.39   - EKOS stopped 12/3 - RLE bypass lysis, relining w/ Vascular surgery 12/3 - Monitor hemodynamics currently maintaining good blood pressure hydralazine  as needed is ordered keep SBP below 160 - Restart home antihypertensives as indicated - Home Lipitor  restarted - Nicotine  patch in place - Patient at home on aspirin  Ranexa  and Xarelto  and he states he has been taking them.  -Heparin  gtt stopped/reversed 12/4 afternoon after small hematoma to left groin site evacuated at bedside per Vascular; ultrasound negative for pseudoaneurysm. Xarelto  remains held in setting of c/f groin site bleeding with small hematoma formation. Vascular to discuss w/ surgery if any intervention is warranted-->bed rest for now - Monitor renal functions urine output and electrolytes - Monitor hemoglobin -stable at 11.9 (11.7)  Substance abuse -Confirms drug/tobacco abuse -Confirms willingness to stop drugs/smoking  Dispo: home pending vascular clearance; has  transfer orders in place  ICU will take over care as long as the patient is in ICU  per the request of vascular surgery team.  Labs   CBC: Recent Labs  Lab 02/01/24 0226 02/01/24 0648 02/01/24 0754 02/02/24 0247 02/03/24 0316  WBC 6.1 6.9 7.3 6.9 8.2  HGB 12.7* 13.0 13.1 11.7* 11.9*  HCT 39.5 39.9 40.2 35.4* 35.6*  MCV 91.4 90.9 91.0 90.3 89.0  PLT 238 212 211 187 213    Basic Metabolic Panel: Recent Labs  Lab 01/30/24 1337 01/31/24 0201 02/01/24 0648 02/02/24 0530  NA 139 140 139 134*  K 4.3 3.7 3.7 3.7  CL 106 104 106 103  CO2 23 30 24 24   GLUCOSE 84 114* 107* 92  BUN 18 18 10 8   CREATININE 1.48* 1.39* 1.08 1.14  CALCIUM  10.1 9.7 9.5 9.0  MG  --   --   --  1.6*  PHOS  --   --   --  2.8   GFR: Estimated Creatinine Clearance: 79.8 mL/min (by C-G formula based on SCr of 1.14 mg/dL). Recent Labs  Lab 02/01/24 0648 02/01/24 0754 02/02/24 0247 02/03/24 0316  WBC 6.9 7.3 6.9 8.2    Liver Function Tests: No results for input(s): AST, ALT, ALKPHOS, BILITOT, PROT, ALBUMIN in the last 168 hours. No results for input(s): LIPASE, AMYLASE in the last 168 hours. No results for input(s): AMMONIA in the last 168 hours.  ABG  Component Value Date/Time   TCO2 26 11/06/2023 1339     Coagulation Profile: No results for input(s): INR, PROTIME in the last 168 hours.  Cardiac Enzymes: No results for input(s): CKTOTAL, CKMB, CKMBINDEX, TROPONINI in the last 168 hours.  HbA1C: Hgb A1c MFr Bld  Date/Time Value Ref Range Status  10/13/2023 06:49 PM 5.4 4.8 - 5.6 % Final    Comment:    (NOTE) Diagnosis of Diabetes The following HbA1c ranges recommended by the American Diabetes Association (ADA) may be used as an aid in the diagnosis of diabetes mellitus.  Hemoglobin             Suggested A1C NGSP%              Diagnosis  <5.7                   Non Diabetic  5.7-6.4                Pre-Diabetic  >6.4                    Diabetic  <7.0                   Glycemic control for                       adults with diabetes.    11/29/2020 02:03 AM 5.3 4.8 - 5.6 % Final    Comment:    (NOTE) Pre diabetes:          5.7%-6.4%  Diabetes:              >6.4%  Glycemic control for   <7.0% adults with diabetes    CBG: No results for input(s): GLUCAP in the last 168 hours.  Review of Systems:   12 point review of systems is done which is negative for nothing else what is mentioned in HPI.  Past Medical History:  He,  has a past medical history of Abnormal stress test, Acute left ankle pain (03/23/2023), Anxiety, Asthma, Atypical chest pain (05/07/2017), Chest pain  Unstable angina (06/15/2023), Chronic bilateral low back pain with left-sided sciatica (01/11/2023), Chronic health problem (06/15/2023), Chronic pain syndrome (03/29/2023), Cocaine abuse (HCC) (06/16/2023), Coronary artery disease, DDD (degenerative disc disease), lumbar (11/08/2019), Dilation of thoracic aorta (06/17/2023), Encounter for medication monitoring (01/10/2023), Encounter for well adult exam with abnormal findings (04/08/2023), Essential hypertension, Heart failure with mid-range ejection fraction (HFmEF) (HCC) (06/17/2023), CABG (x4 01/14/2016 [place] ), Hyperlipidemia, Impaired mobility and ADLs (03/23/2023), Ischemic cardiomyopathy (12/17/2020), Lumbar radiculopathy (07/05/2023), Myocardial infarction (HCC) (2017), NSTEMI (non-ST elevated myocardial infarction) (HCC) (11/28/2020), OAB (overactive bladder) (01/11/2023), PAD (peripheral artery disease) (09/21/2022), PVD (peripheral vascular disease) (09/21/2019), Right leg pain (03/23/2023), Shortness of breath, Sinus bradycardia (11/29/2019), Tobacco use disorder, Unhoused person (06/16/2023), and Unstable angina (HCC) (06/15/2023).   Surgical History:   Past Surgical History:  Procedure Laterality Date   ABDOMINAL AORTOGRAM W/LOWER EXTREMITY Bilateral 09/21/2019   Procedure: ABDOMINAL  AORTOGRAM W/LOWER EXTREMITY;  Surgeon: Eliza Lonni RAMAN, MD;  Location: [facility] INVASIVE CV LAB;  Service: Cardiovascular;  Laterality: Bilateral;   ABDOMINAL AORTOGRAM W/LOWER EXTREMITY N/A 12/07/2019   Procedure: ABDOMINAL AORTOGRAM W/LOWER EXTREMITY;  Surgeon: Eliza Lonni RAMAN, MD;  Location: [facility] INVASIVE CV LAB;  Service: Cardiovascular;  Laterality: N/A;   ABDOMINAL AORTOGRAM W/LOWER EXTREMITY N/A 09/09/2022   Procedure: ABDOMINAL AORTOGRAM W/LOWER EXTREMITY;  Surgeon: Gretta Lonni PARAS, MD;  Location: [facility] INVASIVE CV LAB;  Service: Vascular;  Laterality: N/A;   ABDOMINAL AORTOGRAM W/LOWER EXTREMITY N/A 01/31/2024   Procedure: ABDOMINAL AORTOGRAM W/LOWER EXTREMITY;  Surgeon: Serene Gaile ORN, MD;  Location: [facility] INVASIVE CV LAB;  Service: Cardiovascular;  Laterality: N/A;   CARDIAC CATHETERIZATION     COLONOSCOPY N/A 11/19/2023   Procedure: COLONOSCOPY;  Surgeon: Stacia Glendia BRAVO, MD;  Location: [facility] ENDOSCOPY;  Service: Gastroenterology;  Laterality: N/A;   CORONARY ARTERY BYPASS GRAFT     quadruple bypas 2017   CORONARY STENT INTERVENTION N/A 11/26/2019   Procedure: CORONARY STENT INTERVENTION;  Surgeon: Mady Bruckner, MD;  Location: [facility] INVASIVE CV LAB;  Service: Cardiovascular;  Laterality: N/A;   ESOPHAGOGASTRODUODENOSCOPY N/A 11/19/2023   Procedure: EGD (ESOPHAGOGASTRODUODENOSCOPY);  Surgeon: Stacia Glendia BRAVO, MD;  Location: [facility] ENDOSCOPY;  Service: Gastroenterology;  Laterality: N/A;   FEMORAL-POPLITEAL BYPASS GRAFT Right 12/25/2019   Procedure: BYPASS GRAFT FEMORAL- BELOW KNEE POPLITEAL ARTERY RIGHT USING GORE PROPATEN VASCULAR GRAFT;  Surgeon: Eliza Bruckner RAMAN, MD;  Location[facility] OR;  Service: Vascular;  Laterality: Right;   FEMORAL-POPLITEAL BYPASS GRAFT Right 09/21/2022   Procedure: REDO RIGHT LOWER EXTREMITY BYPASS USING GORE PROPATEN 6mm REMOVALBLE RING GRAFT;  Surgeon: Eliza Bruckner RAMAN, MD;  Location: [facility] OR;  Service: Vascular;  Laterality: Right;    INTRAOPERATIVE ARTERIOGRAM Right 09/21/2022   Procedure: INTRA OPERATIVE ARTERIOGRAM;  Surgeon: Eliza Bruckner RAMAN, MD;  Location[facility];  Service: Vascular;  Laterality: Right;   LEFT HEART CATH AND CORS/GRAFTS ANGIOGRAPHY N/A 11/26/2019   Procedure: LEFT HEART CATH AND CORS/GRAFTS ANGIOGRAPHY;  Surgeon: Mady Bruckner, MD;  Location: [facility] INVASIVE CV LAB;  Service: Cardiovascular;  Laterality: N/A;   LEFT HEART CATH AND CORS/GRAFTS ANGIOGRAPHY N/A 12/01/2020   Procedure: LEFT HEART CATH AND CORS/GRAFTS ANGIOGRAPHY;  Surgeon: Burnard Debby LABOR, MD;  Location: [facility] INVASIVE CV LAB;  Service: Cardiovascular;  Laterality: N/A;   LEFT HEART CATH AND CORS/GRAFTS ANGIOGRAPHY N/A 06/16/2023   Procedure: LEFT HEART CATH AND CORS/GRAFTS ANGIOGRAPHY;  Surgeon: Jordan, Peter M, MD;  Location: [facility] INVASIVE CV LAB;  Service: Cardiovascular;  Laterality: N/A;   LOWER EXTREMITY ANGIOGRAM Right 12/25/2019   Procedure: RIGHT LOWER EXTREMITY ANGIOGRAM;  Surgeon: Eliza Bruckner RAMAN, MD;  Location: [facility] OR;  Service: Vascular;  Laterality: Right;   LOWER EXTREMITY INTERVENTION Right 02/01/2024   Procedure: LOWER EXTREMITY INTERVENTION;  Surgeon: Lanis Fonda BRAVO, MD;  Location: [facility] INVASIVE CV LAB;  Service: Cardiovascular;  Laterality: Right;   PERIPHERAL VASCULAR BALLOON ANGIOPLASTY Left 12/07/2019   Procedure: PERIPHERAL VASCULAR BALLOON ANGIOPLASTY;  Surgeon: Eliza Bruckner RAMAN, MD;  Location: [facility] INVASIVE CV LAB;  Service: Cardiovascular;  Laterality: Left;  SFA   PERIPHERAL VASCULAR THROMBECTOMY  01/31/2024   Procedure: PERIPHERAL VASCULAR THROMBECTOMY;  Surgeon: Serene Gaile ORN, MD;  Location: [facility] INVASIVE CV LAB;  Service: Cardiovascular;;   PERIPHERAL VASCULAR THROMBECTOMY N/A 02/01/2024   Procedure: PERIPHERAL VASCULAR THROMBECTOMY;  Surgeon: Lanis Fonda BRAVO, MD;  Location: [facility] INVASIVE CV LAB;  Service: Cardiovascular;  Laterality: N/A;     Social History:   reports that he has been smoking cigars and  cigarettes. He has been exposed to tobacco smoke. He has never used smokeless tobacco. He reports current drug use. Drugs: Marijuana and Cocaine. He reports that he does not drink alcohol.   Family History:  His family history includes Heart disease in his father, mother, and sister.   Allergies No Known Allergies   Home Medications  Prior to Admission medications   Medication Sig Start Date End Date Taking? Authorizing Provider  amLODipine  (  NORVASC ) 5 MG tablet Take 1 tablet (5 mg total) by mouth daily. 12/12/23 03/11/24 Yes Fountain, Madison L, NP  aspirin  325 MG tablet Take 325 mg by mouth daily.   Yes [provider]  atorvastatin  (LIPITOR ) 80 MG tablet Take 1 tablet (80 mg total) by mouth daily. 11/06/23  Yes Dean Clarity, MD  carvedilol  (COREG ) 6.25 MG tablet Take 1 tablet (6.25 mg total) by mouth 2 (two) times daily with a meal. 11/06/23  Yes Dean Clarity, MD  gabapentin  (NEURONTIN ) 300 MG capsule Take 1 capsule (300 mg total) by mouth 3 (three) times daily. Patient taking differently: Take 300 mg by mouth 3 (three) times daily as needed (neuropathy, pain). 11/06/23  Yes Dean Clarity, MD  isosorbide  mononitrate (IMDUR ) 120 MG 24 hr tablet Take 1 tablet (120 mg total) by mouth daily. Patient taking differently: Take 120 mg by mouth 2 (two) times daily. 11/06/23  Yes Dean Clarity, MD  losartan  (COZAAR ) 50 MG tablet Take 1 tablet (50 mg total) by mouth daily. 11/06/23  Yes Dean Clarity, MD  nicotine  (NICODERM CQ  - DOSED IN MG/24 HOURS) 14 mg/24hr patch Place 1 patch (14 mg total) onto the skin daily. 11/19/23  Yes [name], [name], DO  ranolazine  (RANEXA ) 1000 MG SR tablet Take 1 tablet (1,000 mg total) by mouth 2 (two) times daily. Patient taking differently: Take 1,000 mg by mouth daily. 11/06/23  Yes Dean Clarity, MD  rivaroxaban  (XARELTO ) 20 MG TABS tablet Take 1 tablet (20 mg total) by mouth daily with supper. 12/12/23  Yes Fountain, Madison L, NP  cilostazol  (PLETAL )  100 MG tablet Take 1 tablet (100 mg total) by mouth 2 (two) times daily. Patient not taking: Reported on 12/12/2023 11/06/23   Dean Clarity, MD  nicotine  polacrilex (NICORETTE ) 4 MG gum Take 1 each (4 mg total) by mouth as needed for smoking cessation. Patient not taking: Reported on 01/31/2024 12/01/23   Kennyth Domino, FNP  nitroGLYCERIN  (NITROSTAT ) 0.4 MG SL tablet Place 1 tablet (0.4 mg total) under the tongue every 5 (five) minutes as needed for chest pain. Patient not taking: Reported on 01/31/2024 12/12/23   Rana Lum CROME, NP    Warren Shade, DNP, AGACNP-BC [facility]  Please see [email or web address] for pager details.  From 7A-7P if no response, please call [phone]. After hours, please call ELink [phone].

## 2024-02-03 NOTE — TOC Progression Note (Signed)
 Transition of Care [facility]) - Progression Note    Patient Details  Name: Benjamin Espinoza MRN: [id number] Date of Birth: [birth year]  Transition of Care [facility]) CM/SW Contact  Graves-Bigelow, Erminio Deems, RN Phone Number: 02/03/2024, 3:59 PM  Clinical Narrative:  ICM spoke with patient regarding recommendations for OPPT and the patient is agreeable. Patient states he would like for the referral to be submitted to the [facility]. Location. Ambulatory referral submitted and the office will call the patient for a visit time. DME rolling walker ordered and submitted via the hub- [employer] to service. DME to be delivered to the room. No further needs identified at this time.   Expected Discharge Plan: OP Rehab Barriers to Discharge: No Barriers Identified    Expected Discharge Plan and Services In-house Referral: Clinical Social Work Discharge Planning Services: CM Consult Post Acute Care Choice: NA (Boarding home-rents a room) Living arrangements for the past 2 months: Boarding House                 DME Arranged: Vannie rolling DME Agency: [employer] Date DME Agency Contacted: 02/03/24 Time DME Agency Contacted: [phone] Representative spoke with at DME Agency: hub HH Arranged: NA  Social Drivers of Health (SDOH) Interventions SDOH Screenings   Food Insecurity: Food Insecurity Present (02/01/2024)  Housing: Low Risk  (02/01/2024)  Transportation Needs: Unmet Transportation Needs (02/01/2024)  Utilities: Not At Risk (02/01/2024)  Alcohol Screen: Low Risk  (03/25/2023)  Depression (PHQ2-9): Low Risk  (06/09/2023)  Recent Concern: Depression (PHQ2-9) - Medium Risk (03/23/2023)  Financial Resource Strain: High Risk (06/09/2023)  Physical Activity: Inactive (03/25/2023)  Social Connections: Socially Integrated (06/16/2023)  Stress: Stress Concern Present (11/25/2023)  Tobacco Use: High Risk (01/30/2024)  Health Literacy: Adequate Health Literacy (03/25/2023)    Readmission Risk  Interventions     No data to display

## 2024-02-04 ENCOUNTER — Other Ambulatory Visit (HOSPITAL_COMMUNITY): Payer: Self-pay

## 2024-02-04 LAB — CBC
HCT: 33.4 % — ABNORMAL LOW (ref 39.0–52.0)
Hemoglobin: 10.9 g/dL — ABNORMAL LOW (ref 13.0–17.0)
MCH: 29.4 pg (ref 26.0–34.0)
MCHC: 32.6 g/dL (ref 30.0–36.0)
MCV: 90 fL (ref 80.0–100.0)
Platelets: 225 K/uL (ref 150–400)
RBC: 3.71 MIL/uL — ABNORMAL LOW (ref 4.22–5.81)
RDW: 14.7 % (ref 11.5–15.5)
WBC: 8 K/uL (ref 4.0–10.5)
nRBC: 0 % (ref 0.0–0.2)

## 2024-02-04 LAB — BASIC METABOLIC PANEL WITH GFR
Anion gap: 9 (ref 5–15)
BUN: 17 mg/dL (ref 6–20)
CO2: 22 mmol/L (ref 22–32)
Calcium: 9.2 mg/dL (ref 8.9–10.3)
Chloride: 102 mmol/L (ref 98–111)
Creatinine, Ser: 1.22 mg/dL (ref 0.61–1.24)
GFR, Estimated: >60 mL/min (ref >60)
Glucose, Bld: 110 mg/dL — ABNORMAL HIGH (ref 70–99)
Potassium: 3.9 mmol/L (ref 3.5–5.1)
Sodium: 133 mmol/L — ABNORMAL LOW (ref 135–145)

## 2024-02-04 LAB — HEPARIN LEVEL (UNFRACTIONATED): Heparin Unfractionated: 0.3 [IU]/mL (ref 0.30–0.70)

## 2024-02-04 LAB — MAGNESIUM: Magnesium: 1.8 mg/dL (ref 1.7–2.4)

## 2024-02-04 MED ORDER — RIVAROXABAN 20 MG PO TABS
20.0000 mg | ORAL_TABLET | Freq: Every day | ORAL | Status: DC
  Administered 2024-02-04: 20 mg via ORAL
  Filled 2024-02-04: qty 1

## 2024-02-04 MED ORDER — MAGNESIUM SULFATE 2 GM/50ML IV SOLN
2.0000 g | Freq: Once | INTRAVENOUS | Status: AC
  Administered 2024-02-04: 2 g via INTRAVENOUS
  Filled 2024-02-04: qty 50

## 2024-02-04 MED ORDER — TRAMADOL HCL 50 MG PO TABS
50.0000 mg | ORAL_TABLET | Freq: Four times a day (QID) | ORAL | 0 refills | Status: DC | PRN
  Filled 2024-02-04: qty 20, 5d supply, fill #0

## 2024-02-04 MED ORDER — LIDOCAINE 5 % EX PTCH
1.0000 | MEDICATED_PATCH | Freq: Every day | CUTANEOUS | 0 refills | Status: DC
  Filled 2024-02-04: qty 30, 30d supply, fill #0

## 2024-02-04 MED ORDER — HEPARIN (PORCINE) 25000 UT/250ML-% IV SOLN
1200.0000 [IU]/h | INTRAVENOUS | Status: AC
  Filled 2024-02-04: qty 250

## 2024-02-04 MED ORDER — RIVAROXABAN 20 MG PO TABS
20.0000 mg | ORAL_TABLET | Freq: Every day | ORAL | 11 refills | Status: AC
  Filled 2024-02-04: qty 30, 30d supply, fill #0
  Filled 2024-03-16: qty 30, 30d supply, fill #1

## 2024-02-04 MED ORDER — TRAMADOL HCL 50 MG PO TABS: 50.0000 mg | ORAL_TABLET | Freq: Four times a day (QID) | ORAL | 0 refills | Status: DC | PRN

## 2024-02-04 MED ORDER — ISOSORBIDE MONONITRATE ER 30 MG PO TB24
90.0000 mg | ORAL_TABLET | Freq: Every day | ORAL | 2 refills | Status: DC
  Filled 2024-02-04: qty 30, 10d supply, fill #0
  Filled 2024-03-16: qty 30, 10d supply, fill #1

## 2024-02-04 MED ORDER — GABAPENTIN 300 MG PO CAPS
300.0000 mg | ORAL_CAPSULE | Freq: Three times a day (TID) | ORAL | 1 refills | Status: AC | PRN
  Filled 2024-02-04: qty 90, 30d supply, fill #0
  Filled 2024-03-16: qty 90, 30d supply, fill #1

## 2024-02-04 MED ORDER — ASPIRIN 81 MG PO TBEC
81.0000 mg | DELAYED_RELEASE_TABLET | Freq: Every day | ORAL | 12 refills | Status: AC
  Filled 2024-02-04: qty 30, 30d supply, fill #0
  Filled 2024-03-16: qty 30, 30d supply, fill #1

## 2024-02-04 MED ORDER — ACETAMINOPHEN 500 MG PO TABS: 500.0000 mg | ORAL_TABLET | Freq: Four times a day (QID) | ORAL | Status: AC | PRN

## 2024-02-04 MED ORDER — POTASSIUM CHLORIDE CRYS ER 20 MEQ PO TBCR
20.0000 meq | EXTENDED_RELEASE_TABLET | Freq: Once | ORAL | Status: AC
  Administered 2024-02-04: 20 meq via ORAL
  Filled 2024-02-04: qty 1

## 2024-02-04 MED ORDER — CARVEDILOL 3.125 MG PO TABS
3.1250 mg | ORAL_TABLET | Freq: Two times a day (BID) | ORAL | 2 refills | Status: AC
  Filled 2024-02-04: qty 60, 30d supply, fill #0
  Filled 2024-03-16: qty 60, 30d supply, fill #1

## 2024-02-04 NOTE — Progress Notes (Signed)
 PHARMACY - ANTICOAGULATION CONSULT NOTE  Pharmacy Consult for heparin  (PTA Xarelto ) Indication: Ischemic limb  No Known Allergies  Patient Measurements: Height: 6' 1 (185.4 cm) Weight: 83.7 kg (184 lb 8.4 oz) IBW/kg (Calculated) : 79.9 HEPARIN  DW (KG): 101.3  Vital Signs: Temp: 99.3 F (37.4 C) (12/06 0500) Temp Source: Oral (12/06 0500) BP: 108/71 (12/06 0724) Pulse Rate: 99 (12/06 0724)  Labs: Recent Labs    02/02/24 0247 02/02/24 0530 02/02/24 0953 02/03/24 0316 02/03/24 1758 02/04/24 0356 02/04/24 0802  HGB 11.7*  --   --  11.9*  --  10.9*  --   HCT 35.4*  --   --  35.6*  --  33.4*  --   PLT 187  --   --  213  --  225  --   HEPARINUNFRC <0.10*  --  0.33  --  0.20*  --  0.30  CREATININE  --  1.14  --   --   --  1.22  --     Estimated Creatinine Clearance: 74.6 mL/min (by C-G formula based on SCr of 1.22 mg/dL).  Assessment: 35 YOM presenting with foot pain, hx severe PAD s/p bypass prescribed Xarelto  PTA however non adherent.  -s/p mechanical thrombectomy of right femoral-popliteal bypass graft with alteplase  infusion (started 12/2) -s/p relook 12/3 - successful pharmacomechanical thrombylysis of bypass; alteplase  discontinued  Per 12/4 discussion with Dr. Lanis, plan was to Xarelto  20mg  po daily with ASA 81mg /day (no plans for load).  Developed small hematoma (2x2cm), some bleeding from L groin when walking yesterday and heparin  was reversed.  Vascular restarted heparin  overnight but was paused @0330  after ~2h of running.    Hematoma stable 12/6, per Dr. Magda OK to challenge with heparin  infusion, no bolus.  CBC stable (Hgb 11.9, pltc 213 - was ~13 pre-lytics).  HL 0.30 therapeutic this morning.  CBC ok, hematoma stable.  OK for discharge per Vascular - transition heparin  IV to Xarelto , no load.   Goal of Therapy:  Heparin  level 0.3-0.5 units/ml Monitor platelets by anticoagulation protocol: Yes   Plan:  STOP heparin  IV at 1200, give Xarelto  20 mg x1 @  1200 with lunch  Maurilio Fila, PharmD Clinical Pharmacist 02/04/2024  8:56 AM

## 2024-02-04 NOTE — Discharge Summary (Signed)
 Physician Discharge Summary       Patient ID: Benjamin Espinoza MRN: [id number] DOB/AGE: 1965-07-13 58 y.o.  Admit date: 01/30/2024 Discharge date: 02/04/2024  Discharge Diagnoses:   Critical limb ischemia right lower extremity CAD status post CABG Hypertension Tobacco use disorder Severe peripheral vascular disease History of congestive heart failure with midrange ejection fraction History of substance use disorder -cocaine CKD stage III creatinine at baseline 1.39 Substance abuse  Discharge summary   This 58 year old gentleman who has a history of significant coronary artery disease hypertension and peripheral arterial disease presents to vascular surgery team with right leg pain. He had multiple failed bypass grafts in the past. He was emergently taken to the OR and is found to have occluded right femoropopliteal bypass graft. This was successfully crossed and mechanical thrombectomy was performed. The graft still remained occluded therefore EKOS catheter was left for overnight thrombolytic therapy. Patient was taken for RLE fem-pop bypass lysis, relining with Vascular 12/3. PCCM consulted for ICU admission.   See timeline below for pertinent Espinoza events 01/31/2024 - Admitted to ICU post right lower extremity angiogram and thrombectomy. 02/01/2024 - right lower extremity bypass lysis, relining 02/02/2024 - Heparin  gtt stopped and reversed after small hematoma to left groin site evacuated at bedside per Vascular; ultrasound negative for pseudoaneurysm. Vascular to discuss w/ surgery if any intervention is warranted-->bed rest for now 02/03/2024 . Doppler DP/PT pulses present to RLE on exam.Per vascular, bed rest for now, cont to hold Benjamin Espinoza, surgical intervention tbd.  02/04/2024 cleared per vascular for discharge, continue aspirin  and Xarelto  upon discharge.  Patient stable and ambulating per baseline discharged home.  Discharge Plan by Active Problems   Critical limb ischemia right  lower extremity CAD status post CABG Hypertension Tobacco use disorder Severe peripheral vascular disease History of congestive heart failure with midrange ejection fraction History of substance use disorder -cocaine CKD stage III creatinine at baseline 1.39 Substance abuse - EKOS stopped 12/3 - RLE bypass lysis, relining w/ Vascular surgery 12/3 Plan: Cleared by vascular surgery 12/6 for discharge Xarelto  and aspirin  to be continued upon discharge, prescriptions written As needed pain medication upon discharge Follow-up with vascular in 1 month Outpatient follow-up with primary care Cessation education provided  Significant Espinoza tests/ studies  See above  Culture data/antimicrobials   None    Consults  Vascular     Discharge Exam: BP 108/71   Pulse 99   Temp 99.3 F (37.4 C) (Oral)   Resp 13   Ht 6' 1 (1.854 m)   Wt 83.7 kg   SpO2 92%   BMI 24.35 kg/m   General: Well-appearing middle-age male sitting up in bedside recliner no acute distress HEENT: [place]/AT, MM pink/moist, PERRL,  Neuro:  alert and oriented x 3, nonfocal CV: s1s2 regular rate and rhythm, no murmur, rubs, or gallops,  PULM: Clear to auscultation bilaterally, no increased work of breathing, no added breath sounds GI: soft, bowel sounds active in all 4 quadrants, non-tender, non-distended, tolerating oral diet Extremities: warm/dry, no edema  Skin: no rashes or lesions  Labs at discharge   Lab Results  Component Value Date   CREATININE 1.22 02/04/2024   BUN 17 02/04/2024   NA 133 (L) 02/04/2024   K 3.9 02/04/2024   CL 102 02/04/2024   CO2 22 02/04/2024   Lab Results  Component Value Date   WBC 8.0 02/04/2024   HGB 10.9 (L) 02/04/2024   HCT 33.4 (L) 02/04/2024   MCV 90.0 02/04/2024  PLT 225 02/04/2024   Lab Results  Component Value Date   ALT 9 11/18/2023   AST 12 (L) 11/18/2023   ALKPHOS 55 11/18/2023   BILITOT 0.3 11/18/2023   Lab Results  Component Value Date   INR 2.0  (H) 11/18/2023   INR 1.0 09/21/2022   INR 1.0 12/25/2019    Current radiological studies    VAS US  GROIN PSEUDOANEURYSM Result Date: 02/02/2024  ARTERIAL PSEUDOANEURYSM  Patient Name:  Benjamin Espinoza  Date of Exam:   02/02/2024 Medical Rec #: [id number]           Accession #:    [id number] Date of Birth: 1965-10-03           Patient Gender: M Patient Age:   58 years Exam Location:  Benjamin Espinoza Procedure:      VAS US  Benjamin Espinoza Referring Phys: Benjamin Espinoza --------------------------------------------------------------------------------  Exam: Left groin Indications: Patient complains of palpable knot. History: S/p vascular revascularization, post left pseudo compression. Comparison Study: No previous Performing Technologist: Benjamin Espinoza RVT, RDMS  Examination Guidelines: A complete evaluation includes B-mode imaging, spectral Doppler, color Doppler, and power Doppler as needed of all accessible portions of each vessel. Bilateral testing is considered an integral part of a complete examination. Limited examinations for reoccurring indications may be performed as noted. +------------+----------+----------+------+----------+ Right DuplexPSV (cm/s) Waveform PlaqueComment(s) +------------+----------+----------+------+----------+ CFA            129     biphasic                  +------------+----------+----------+------+----------+ PFA             64    monophasic                 +------------+----------+----------+------+----------+ Prox SFA        68     biphasic                  +------------+----------+----------+------+----------+ Right Vein comments:CFV patent with phasic flow  Summary: No evidence of pseudoaneurysm, AVF or DVT  Diagnosing physician: Benjamin Espinoza Electronically signed by Benjamin Espinoza on 02/02/2024 at 8:03:23 PM.    --------------------------------------------------------------------------------    Final     Disposition:      Discharge  Instructions     Ambulatory referral to Physical Therapy   Complete by: As directed    Outpatient Physical Therapy evaluation and treatment-office to call with visit times.       Allergies as of 02/04/2024   No Known Allergies      Medication List     PAUSE taking these medications    cilostazol  100 MG tablet Wait to take this until your doctor or other care provider tells you to start again. Commonly known as: PLETAL  Take 1 tablet (100 mg total) by mouth 2 (two) times daily.       STOP taking these medications    amLODipine  5 MG tablet Commonly known as: NORVASC    aspirin  325 MG tablet Replaced by: aspirin  EC 81 MG tablet   losartan  50 MG tablet Commonly known as: COZAAR    ranolazine  1000 MG SR tablet Commonly known as: RANEXA        TAKE these medications    acetaminophen  500 MG tablet Commonly known as: TYLENOL  Take 1 tablet (500 mg total) by mouth every 6 (six) hours as needed for mild pain (pain score 1-3), headache or fever.   aspirin  EC 81 MG tablet Take 1 tablet (81 mg total)  by mouth daily. Swallow whole. Replaces: aspirin  325 MG tablet   atorvastatin  80 MG tablet Commonly known as: LIPITOR  Take 1 tablet (80 mg total) by mouth daily.   carvedilol  3.125 MG tablet Commonly known as: COREG  Take 1 tablet (3.125 mg total) by mouth 2 (two) times daily with a meal. What changed:  medication strength how much to take   gabapentin  300 MG capsule Commonly known as: NEURONTIN  Take 1 capsule (300 mg total) by mouth 3 (three) times daily as needed (neuropathy, pain).   isosorbide  mononitrate 30 MG 24 hr tablet Commonly known as: IMDUR  Take 3 tablets (90 mg total) by mouth daily. What changed:  medication strength how much to take   lidocaine  5 % Commonly known as: LIDODERM  Place 1 patch onto the skin at bedtime. Remove & Discard patch within 12 hours or as directed by MD   nicotine  14 mg/24hr patch Commonly known as: NICODERM CQ  - dosed in  mg/24 hours Place 1 patch (14 mg total) onto the skin daily.   nicotine  polacrilex 4 MG gum Commonly known as: Nicorette  Take 1 each (4 mg total) by mouth as needed for smoking cessation.   nitroGLYCERIN  0.4 MG SL tablet Commonly known as: NITROSTAT  Place 1 tablet (0.4 mg total) under the tongue every 5 (five) minutes as needed for chest pain.   rivaroxaban  20 MG Tabs tablet Commonly known as: XARELTO  Take 1 tablet (20 mg total) by mouth daily. What changed: when to take this   traMADol  50 MG tablet Commonly known as: Ultram  Take 1 tablet (50 mg total) by mouth every 6 (six) hours as needed.               Durable Medical Equipment  (From admission, onward)           Start     Ordered   02/03/24 1555  For home use only DME Walker rolling  Once       Comments: Patient's Height is 6'1.  Question Answer Comment  Walker: With 5 Inch Wheels   Patient needs a walker to treat with the following condition General weakness      02/03/24 1554             Follow-up appointment   Vascular  Discharge Condition:    stable   Signed: Breckyn Troyer D. Harris 02/04/2024, 10:36 AM

## 2024-02-04 NOTE — Progress Notes (Signed)
 [facility]  Subjective  -states his groin feels better   Objective 108/71 99 99.3 F (37.4 C) (Oral) 13 92%  Intake/Output Summary (Last 24 hours) at 02/04/2024 1008 Last data filed at 02/04/2024 0800 Gross per 24 hour  Intake 228.57 ml  Output 900 ml  Net -671.43 ml    Left groin with no significant hematoma Right PT 1+ palpable  Laboratory Lab Results: Recent Labs    02/03/24 0316 02/04/24 0356  WBC 8.2 8.0  HGB 11.9* 10.9*  HCT 35.6* 33.4*  PLT 213 225   BMET Recent Labs    02/02/24 0530 02/04/24 0356  NA 134* 133*  K 3.7 3.9  CL 103 102  CO2 24 22  GLUCOSE 92 110*  BUN 8 17  CREATININE 1.14 1.22  CALCIUM  9.0 9.2    COAG Lab Results  Component Value Date   INR 2.0 (H) 11/18/2023   INR 1.0 09/21/2022   INR 1.0 12/25/2019   No results found for: PTT  Assessment/Planning:  58 year old male admitted now status post lysis and stenting of occluded right leg bypass.  Palpable PT pulse.  Okay for discharge.  Aspirin  Xarelto .  Will arrange follow-up in 1 month with right leg duplex and ABIs.  Appreciate critical care assistance.  Lonni JINNY Gaskins 02/04/2024 10:08 AM --

## 2024-02-04 NOTE — Plan of Care (Signed)
 Patient ready for discharge once orders are written.

## 2024-02-04 NOTE — TOC Transition Note (Signed)
 Transition of Care [facility]) - Discharge Note   Patient Details  Name: Benjamin Espinoza MRN: [id number] Date of Birth: 04/10/1965  Transition [facility] Care [facility]) CM/SW Contact:  Tom-Johnson, Ellerie Arenz Daphne, RN Phone Number: 02/04/2024, 11:27 AM   Clinical Narrative:     Patient is scheduled for discharge today.  Readmission Risk Assessment done. Outpatient PT info, hospital f/u and discharge instructions on AVS. Prescriptions sent to [facility] pharmacy and patient will receive meds prior discharge. Cab voucher will be given at the d/c lounge to transport at discharge.  No further ICM needs noted.      Final next level of care: OP Rehab Barriers to Discharge: Barriers Resolved   Patient Goals and CMS Choice Patient states their goals for this hospitalization and ongoing recovery are:: To return home CMS Medicare.gov Compare Post Acute Care list provided to:: Patient        Discharge Placement                Patient to be transferred to facility by: [facility]      Discharge Plan and Services Additional resources added to the After Visit Summary for   In-house Referral: Clinical Social Work Discharge Planning Services: CM Consult Post Acute Care Choice: NA (Boarding home-rents a room)          DME Arranged: Vannie rolling DME Agency: [employer] Date DME Agency Contacted: 02/03/24 Time DME Agency Contacted: [phone] Representative spoke with at DME Agency: hub HH Arranged: NA          Social Drivers of Health (SDOH) Interventions SDOH Screenings   Food Insecurity: Food Insecurity Present (02/01/2024)  Housing: Low Risk  (02/01/2024)  Transportation Needs: Unmet Transportation Needs (02/01/2024)  Utilities: Not At Risk (02/01/2024)  Alcohol Screen: Low Risk  (03/25/2023)  Depression (PHQ2-9): Low Risk  (06/09/2023)  Recent Concern: Depression (PHQ2-9) - Medium Risk (03/23/2023)  Financial Resource Strain: High Risk (06/09/2023)  Physical Activity: Inactive (03/25/2023)   Social Connections: Socially Integrated (06/16/2023)  Stress: Stress Concern Present (11/25/2023)  Tobacco Use: High Risk (01/30/2024)  Health Literacy: Adequate Health Literacy (03/25/2023)     Readmission Risk Interventions     No data to display

## 2024-02-07 ENCOUNTER — Other Ambulatory Visit: Payer: Self-pay

## 2024-02-07 ENCOUNTER — Other Ambulatory Visit (HOSPITAL_COMMUNITY): Payer: Self-pay

## 2024-02-07 MED ORDER — NITROGLYCERIN 0.4 MG SL SUBL
0.4000 mg | SUBLINGUAL_TABLET | SUBLINGUAL | 3 refills | Status: DC | PRN
  Filled 2024-02-07: qty 25, 8d supply, fill #0
  Filled 2024-03-16: qty 25, 8d supply, fill #1

## 2024-02-07 NOTE — Congregational Nurse Program (Signed)
  Dept: [phone]   Congregational Nurse Program Note  Date of Encounter: 02/07/2024  Past Medical History: Past Medical History:  Diagnosis Date   Abnormal stress test    Acute left ankle pain 03/23/2023   Anxiety    Asthma    as a child   Atypical chest pain 05/07/2017   Chest pain  Unstable angina 06/15/2023   Chronic bilateral low back pain with left-sided sciatica 01/11/2023   Chronic health problem 06/15/2023   Chronic pain syndrome 03/29/2023   Cocaine abuse (HCC) 06/16/2023   Coronary artery disease    DDD (degenerative disc disease), lumbar 11/08/2019   Formatting of this note might be different from the original. 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 There is mild dilatation of the ascending aorta, measuring 43 mm. There is mild dilatation of the aortic root, measuring 44 mm.      Encounter for medication monitoring 01/10/2023   Encounter for well adult exam with abnormal findings 04/08/2023   Essential hypertension    Heart failure with mid-range ejection fraction (HFmEF) (HCC) 06/17/2023   06/16/23 transthoracic echocardiogram: Left Ventricle: Left ventricular ejection fraction 45 to 50%. The left ventricle has mildly decreased function. No left ventricular hypertrophy. 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 [place]    Hyperlipidemia    Impaired mobility and ADLs 03/23/2023   Ischemic cardiomyopathy 12/17/2020   Lumbar radiculopathy 07/05/2023   Myocardial infarction [facility]) 2017   NSTEMI (non-ST elevated myocardial infarction) (HCC) 11/28/2020   OAB (overactive bladder) 01/11/2023   PAD (peripheral artery disease) 09/21/2022   PVD (peripheral vascular disease) 09/21/2019   Right leg pain 03/23/2023   Shortness of breath    Sinus bradycardia 11/29/2019   Tobacco use disorder    Unhoused person 06/16/2023   Unstable angina (HCC) 06/15/2023     Encounter Details:  Community Questionnaire - 02/07/24 1510       Questionnaire   Ask client: Do you give verbal consent for me to treat you today? Yes    Student Assistance N/A    Location Patient Served  [facility]    Encounter Setting CN site    Population Status Unknown    Insurance Medicare    Insurance/Financial Assistance Referral N/A    Medication Patient Medications Reviewed;Provided Medication Assistance;Have Medication Insecurities    Medical Provider Yes    Screening Referrals Made N/A    Medical Referrals Made N/A    Medical Appointment Completed N/A    CNP Interventions Advocate/Support;Navigate Healthcare System;Case Management;Counsel;Educate;Spiritual Care    Screenings CN Performed N/A    ED Visit Averted Yes    Life-Saving Intervention Made Yes         Client to RN office. Client unsure of upcoming appointments. RN wrote times and date down for client. Client was in need of medication refill, RN called [facility] pharmacy for refill. RN picked up and delivered to client. Client to touch base with RN later this week or next. No acute needs at this time. Will continue to monitor client as needed.

## 2024-02-08 ENCOUNTER — Emergency Department (HOSPITAL_COMMUNITY)
Admission: EM | Admit: 2024-02-08 | Discharge: 2024-02-09 | Disposition: A | Discharge status: Home / Self Care | Attending: Emergency Medicine | Admitting: Emergency Medicine

## 2024-02-08 ENCOUNTER — Other Ambulatory Visit: Payer: Self-pay | Admitting: Surgery

## 2024-02-08 ENCOUNTER — Emergency Department (HOSPITAL_COMMUNITY)

## 2024-02-08 ENCOUNTER — Other Ambulatory Visit: Payer: Self-pay

## 2024-02-08 ENCOUNTER — Encounter (HOSPITAL_COMMUNITY): Payer: Self-pay

## 2024-02-08 DIAGNOSIS — R079 Chest pain, unspecified: Secondary | ICD-10-CM

## 2024-02-08 DIAGNOSIS — I739 Peripheral vascular disease, unspecified: Secondary | ICD-10-CM

## 2024-02-08 DIAGNOSIS — M79604 Pain in right leg: Secondary | ICD-10-CM

## 2024-02-08 LAB — COMPREHENSIVE METABOLIC PANEL WITH GFR
ALT: 18 U/L (ref 0–44)
AST: 17 U/L (ref 15–41)
Albumin: 2.6 g/dL — ABNORMAL LOW (ref 3.5–5.0)
Alkaline Phosphatase: 62 U/L (ref 38–126)
Anion gap: 5 (ref 5–15)
BUN: 8 mg/dL (ref 6–20)
CO2: 27 mmol/L (ref 22–32)
Calcium: 9 mg/dL (ref 8.9–10.3)
Chloride: 106 mmol/L (ref 98–111)
Creatinine, Ser: 1.06 mg/dL (ref 0.61–1.24)
GFR, Estimated: >60 mL/min (ref >60)
Glucose, Bld: 83 mg/dL (ref 70–99)
Potassium: 4.1 mmol/L (ref 3.5–5.1)
Sodium: 138 mmol/L (ref 135–145)
Total Bilirubin: 0.5 mg/dL (ref 0.0–1.2)
Total Protein: 6.7 g/dL (ref 6.5–8.1)

## 2024-02-08 LAB — CBC WITH DIFFERENTIAL/PLATELET
Abs Immature Granulocytes: 0.04 K/uL (ref 0.00–0.07)
Basophils Absolute: 0 K/uL (ref 0.0–0.1)
Basophils Relative: 0 %
Eosinophils Absolute: 0.5 K/uL (ref 0.0–0.5)
Eosinophils Relative: 5 %
HCT: 32.2 % — ABNORMAL LOW (ref 39.0–52.0)
Hemoglobin: 10.5 g/dL — ABNORMAL LOW (ref 13.0–17.0)
Immature Granulocytes: 1 %
Lymphocytes Relative: 24 %
Lymphs Abs: 2.1 K/uL (ref 0.7–4.0)
MCH: 30.4 pg (ref 26.0–34.0)
MCHC: 32.6 g/dL (ref 30.0–36.0)
MCV: 93.3 fL (ref 80.0–100.0)
Monocytes Absolute: 1 K/uL (ref 0.1–1.0)
Monocytes Relative: 12 %
Neutro Abs: 5 K/uL (ref 1.7–7.7)
Neutrophils Relative %: 58 %
Platelets: 360 K/uL (ref 150–400)
RBC: 3.45 MIL/uL — ABNORMAL LOW (ref 4.22–5.81)
RDW: 14.6 % (ref 11.5–15.5)
WBC: 8.7 K/uL (ref 4.0–10.5)
nRBC: 0 % (ref 0.0–0.2)

## 2024-02-08 LAB — TROPONIN I (HIGH SENSITIVITY): Troponin I (High Sensitivity): 57 ng/L — ABNORMAL HIGH (ref <18)

## 2024-02-08 MED ORDER — MORPHINE SULFATE (PF) 4 MG/ML IV SOLN
4.0000 mg | Freq: Once | INTRAVENOUS | Status: AC
  Administered 2024-02-08: 4 mg via INTRAVENOUS
  Filled 2024-02-08: qty 1

## 2024-02-08 MED ORDER — IOHEXOL 350 MG/ML SOLN
100.0000 mL | Freq: Once | INTRAVENOUS | Status: AC | PRN
  Administered 2024-02-08: 100 mL via INTRAVENOUS

## 2024-02-08 NOTE — ED Provider Notes (Signed)
 [place] EMERGENCY DEPARTMENT AT [facility] Provider Note   CSN: [id number] Arrival date & time: 02/08/24  1940     Patient presents with: Epistaxis and Leg Pain   Benjamin Espinoza is a 58 y.o. male.  {Add pertinent medical, surgical, social history, OB history to YEP:67052} Patient with history of dilation of thoracic aorta, CHF, hypertension, hyperlipidemia, NSTEMI s/p CABG, critical limb ischemia, CKD, cocaine and tobacco abuse presents today with complaints of right leg pain. He was just admitted for critical limb ischemia in the right leg, had lysis therapy and stenting done, discharge on 12/6. Reports that since discharge he has had worsening right thigh pain in his hamstring area. Reports this is the same area that he had pain in when the limb ischemia was found. He has sensation in his right lower leg. Reports he is having difficulty walking due to severe pain. Reports that his leg is swollen as well which is new. Denies fevers or chills. He did resume Xarelto  upon hospital discharge and has been complaint with this. Also mentions that he is having left sided chest pain, nonradiating. Reports that pain began a few weeks ago and has been persistent since then. Denies shortness of breath worse than baseline. No cough or congestion.   The history is provided by the patient. No language interpreter was used.  Epistaxis Leg Pain      Prior to Admission medications   Medication Sig Start Date End Date Taking? Authorizing Provider  acetaminophen  (TYLENOL ) 500 MG tablet Take 1 tablet (500 mg total) by mouth every 6 (six) hours as needed for mild pain (pain score 1-3), headache or fever. 02/04/24   Harold Scholz, MD  aspirin  EC 81 MG tablet Take 1 tablet (81 mg total) by mouth daily. Swallow whole. 02/04/24   Harold Scholz, MD  atorvastatin  (LIPITOR ) 80 MG tablet Take 1 tablet (80 mg total) by mouth daily. 11/06/23   Dean Clarity, MD  carvedilol  (COREG ) 3.125 MG tablet Take  1 tablet (3.125 mg total) by mouth 2 (two) times daily with a meal. 02/04/24   Harold Scholz, MD  cilostazol  (PLETAL ) 100 MG tablet Take 1 tablet (100 mg total) by mouth 2 (two) times daily. Patient not taking: Reported on 12/12/2023 11/06/23   Dean Clarity, MD  gabapentin  (NEURONTIN ) 300 MG capsule Take 1 capsule (300 mg total) by mouth 3 (three) times daily as needed (neuropathy, pain). 02/04/24   Harold Scholz, MD  isosorbide  mononitrate (IMDUR ) 30 MG 24 hr tablet Take 3 tablets (90 mg total) by mouth daily. 02/04/24   Harold Scholz, MD  lidocaine  (LIDODERM ) 5 % Place 1 patch onto the skin at bedtime. Remove & Discard patch within 12 hours or as directed by MD 02/04/24   Harold Scholz, MD  nicotine  (NICODERM CQ  - DOSED IN MG/24 HOURS) 14 mg/24hr patch Place 1 patch (14 mg total) onto the skin daily. 11/19/23   [name], [name], DO  nicotine  polacrilex (NICORETTE ) 4 MG gum Take 1 each (4 mg total) by mouth as needed for smoking cessation. Patient not taking: Reported on 01/31/2024 12/01/23   Kennyth Domino, FNP  nitroGLYCERIN  (NITROSTAT ) 0.4 MG SL tablet Place 1 tablet (0.4 mg total) under the tongue every 5 (five) minutes as needed for chest pain. Patient not taking: Reported on 01/31/2024 12/12/23   Rana Lum CROME, NP  nitroGLYCERIN  (NITROSTAT ) 0.4 MG SL tablet Dissolve 1 tablet (0.4 mg total) under the tongue every 5 (five) minutes as needed for chest pain. Do  not exceed a total of 3 doses in 15 minutes. 12/12/23   Rana Lum CROME, NP  rivaroxaban  (XARELTO ) 20 MG TABS tablet Take 1 tablet (20 mg total) by mouth daily. 02/04/24   Harold Scholz, MD  traMADol  (ULTRAM ) 50 MG tablet Take 1 tablet (50 mg total) by mouth every 6 (six) hours as needed. 02/04/24 02/03/25  Arloa Folks D, NP    Allergies: Patient has no known allergies.    Review of Systems  Cardiovascular:  Positive for chest pain.  All other systems reviewed and are negative.   Updated Vital Signs BP (!) 150/105 (BP Location:  Right Arm)   Pulse 80   Temp 98 F (36.7 C) (Oral)   Resp 19   Ht 6' 1 (1.854 m)   Wt 89.4 kg   SpO2 100%   BMI 25.99 kg/m   Physical Exam Vitals and nursing note reviewed.  Constitutional:      General: He is not in acute distress.    Appearance: Normal appearance. He is normal weight. He is not ill-appearing, toxic-appearing or diaphoretic.  HENT:     Head: Normocephalic and atraumatic.  Cardiovascular:     Rate and Rhythm: Normal rate and regular rhythm.     Pulses:          Dorsalis pedis pulses are 2+ on the right side and 2+ on the left side.       Posterior tibial pulses are 2+ on the right side and 2+ on the left side.     Heart sounds: Normal heart sounds.     Comments: Pulses palpable with doppler Pulmonary:     Effort: Pulmonary effort is normal. No respiratory distress.     Breath sounds: Normal breath sounds.  Abdominal:     General: Abdomen is flat.     Palpations: Abdomen is soft.     Tenderness: There is no abdominal tenderness.  Musculoskeletal:        General: Normal range of motion.     Cervical back: Normal range of motion.     Comments: Trace swelling noted to the RLE compared to LLE, nonpitting, no erythema, warmth, fluctuance, induration, or skin changes. Well healed surgical scars noted.   Skin:    General: Skin is warm and dry.     Comments: Surgical scar present in the groin, no hematoma or tenderness.   Neurological:     General: No focal deficit present.     Mental Status: He is alert.  Psychiatric:        Mood and Affect: Mood normal.        Behavior: Behavior normal.     (all labs ordered are listed, but only abnormal results are displayed) Labs Reviewed  CBC WITH DIFFERENTIAL/PLATELET - Abnormal; Notable for the following components:      Result Value   RBC 3.45 (*)    Hemoglobin 10.5 (*)    HCT 32.2 (*)    All other components within normal limits  COMPREHENSIVE METABOLIC PANEL WITH GFR - Abnormal; Notable for the following  components:   Albumin 2.6 (*)    All other components within normal limits  TROPONIN I (HIGH SENSITIVITY) - Abnormal; Notable for the following components:   Troponin I (High Sensitivity) 57 (*)    All other components within normal limits  TROPONIN I (HIGH SENSITIVITY)    EKG: EKG Interpretation Date/Time:  Wednesday February 08 2024 23:11:16 EST Ventricular Rate:  80 PR Interval:  143 QRS Duration:  89 QT Interval:  397 QTC Calculation: 458 R Axis:   68  Text Interpretation: Sinus rhythm Anteroseptal infarct, age indeterminate When compared with ECG of 01/30/2024, T wave abnormality is no longer present Confirmed by Raford Lenis ([id number]) on 02/08/2024 11:14:45 PM  Radiology: [facility] Chest 2 View Result Date: 02/08/2024 CLINICAL DATA:  Chest pain EXAM: CHEST - 2 VIEW COMPARISON:  01/30/2024, 11/18/2023, CT 10/13/2023, chest x-ray 06/15/2023 FINDINGS: Sternotomy. Interval mild enlargement of cardiomediastinal silhouette compared to prior. No focal opacity or pleural effusion. Aortic atherosclerosis. No pneumothorax IMPRESSION: Interval mild enlargement of cardiomediastinal silhouette compared to prior, chest CT follow-up as indicated given history of chest pain. Otherwise no radiographic evidence for acute cardiopulmonary abnormality. Electronically Signed   By: Luke Bun M.D.   On: 02/08/2024 22:02    {Document cardiac monitor, telemetry assessment procedure when appropriate:32947} Procedures   Medications Ordered in the ED  morphine  (PF) 4 MG/ML injection 4 mg (4 mg Intravenous Given 02/08/24 2216)  iohexol  (OMNIPAQUE ) 350 MG/ML injection 100 mL (100 mLs Intravenous Contrast Given 02/08/24 2344)      {Click here for ABCD2, HEART and other calculators REFRESH Note before signing:1}                              Medical Decision Making Amount and/or Complexity of Data Reviewed Labs: ordered. Radiology: ordered.  Risk Prescription drug management.   This patient is a 58 y.o.  male who presents to the ED for concern of right leg pain, this involves an extensive number of treatment options, and is a complaint that carries with it a high risk of complications and morbidity. The emergent differential diagnosis prior to evaluation includes, but is not limited to,  DVT, post op infection, hematoma, limb ischemia, normal post op pain . This is not an exhaustive differential.   Past Medical History / Co-morbidities / Social History:  history of dilation of thoracic aorta, CHF, hypertension, hyperlipidemia, NSTEMI s/p CABG, critical limb ischemia, CKD, cocaine and tobacco abuse  Additional history: Chart reviewed. Pertinent results include: discharged on 12/06 after admission for critical limb ischemia status post stenting and lysis treatment, mechanical thrombectomy.  Physical Exam: Physical exam performed. The pertinent findings include: DP and PT pulses palpable with doppler. Trace swelling noted to the RLE compared to LLE, nonpitting, no erythema, warmth, fluctuance, induration, or skin changes. Well healed surgical scars noted.   Surgical scar present in the groin, no hematoma or tenderness.   Lab Tests: I ordered, and personally interpreted labs.  The pertinent results include: No leukocytosis, hgb stable at 10.5 (10.9 5 days ago), troponin 57 (was 33 10 days ago), second troponin is pending   Imaging Studies: I ordered imaging studies including CTA bifem. I independently visualized and interpreted imaging which showed ***. I agree with the radiologist interpretation.   Cardiac Monitoring:  The patient was maintained on a cardiac monitor.  My attending physician Dr. PIERRETTE viewed and interpreted the cardiac monitored which showed an underlying rhythm of: ***. I agree with this interpretation.   Medications: I ordered medication including ***  for ***. Reevaluation of the patient after these medicines showed that the patient {resolved/improved/worsened:23923::improved}.  I have reviewed the patients home medicines and have made adjustments as needed.  Consultations Obtained: I requested consultation with the ***,  and discussed lab and imaging findings as well as pertinent plan - they recommend: ***   Disposition: After consideration of the diagnostic  results and the patients response to treatment, I feel that *** .   ***emergency department workup does not suggest an emergent condition requiring admission or immediate intervention beyond what has been performed at this time. The plan is: ***. The patient is safe for discharge and has been instructed to return immediately for worsening symptoms, change in symptoms or any other concerns.  I discussed this case with my attending physician Dr. PIERRETTE who cosigned this note including patient's presenting symptoms, physical exam, and planned diagnostics and interventions. Attending physician stated agreement with plan or made changes to plan which were implemented.     {Document critical care time when appropriate  Document review of labs and clinical decision tools ie CHADS2VASC2, etc  Document your independent review of radiology images and any outside records  Document your discussion with family members, caretakers and with consultants  Document social determinants of health affecting pt's care  Document your decision making why or why not admission, treatments were needed:32947:::1}   Final diagnoses:  None    ED Discharge Orders     None

## 2024-02-08 NOTE — ED Triage Notes (Signed)
 Patient from home with complaints of a nosebleed that started two hours ago. Patient called 911 and the bleeding was controlled. Patient states that he also has right leg pain from a recent right leg bypass. Patient states that he had 2 blood clots. The procedure was done last Wednesday. Patient states that the procedure did not go that well as they had trouble controlling the bleeding. Patient stayed in the hospital until Saturday. Patient states swelling and pain in the right leg. HX of COPD. EMS states bilateral clear upper lung sounds and rales in the lower lungs. No

## 2024-02-09 DIAGNOSIS — M79651 Pain in right thigh: Secondary | ICD-10-CM | POA: Diagnosis not present

## 2024-02-09 LAB — TROPONIN I (HIGH SENSITIVITY): Troponin I (High Sensitivity): 60 ng/L — ABNORMAL HIGH (ref <18)

## 2024-02-09 MED ORDER — MORPHINE SULFATE (PF) 4 MG/ML IV SOLN
4.0000 mg | Freq: Once | INTRAVENOUS | Status: AC
  Administered 2024-02-09: 4 mg via INTRAVENOUS
  Filled 2024-02-09: qty 1

## 2024-02-09 NOTE — Discharge Instructions (Signed)
 Your workup this evening was reassuring.  I recommend following up with your vascular surgeon for further evaluation of your right leg pain.  I recommend following up with cardiology for further evaluation of your chronic chest pain.  If you develop any life-threatening symptoms please return to the emergency department.

## 2024-02-09 NOTE — ED Provider Notes (Signed)
°  Physical Exam  BP (!) 145/99 (BP Location: Right Arm)   Pulse 71   Temp 98.3 F (36.8 C) (Oral)   Resp 14   Ht 6' 1 (1.854 m)   Wt 89.4 kg   SpO2 98%   BMI 25.99 kg/m   Physical Exam  Procedures  Procedures  ED Course / MDM    Medical Decision Making Amount and/or Complexity of Data Reviewed Labs: ordered. Radiology: ordered.  Risk Prescription drug management.   Patient care assumed at shift handoff from Sarah Smoot, PA-C.  See her note for full details.  At time of shift handoff plan to follow-up on CT scan and on troponin.  Initial troponin minimally elevated but consistent with patient's baseline.  No significant delta change on troponin patient may follow-up with cardiology for further evaluation.  Patient with recent revascularization of the right leg.  Plan to check CT scan to ensure no acute abnormality.  If no acute abnormality patient may follow-up with vascular surgery.  Repeat troponin of 60 with no significant delta.  CT scan showing  1. Infrarenal abdominal aortic aneurysm measuring up to 3.0 cm, unchanged;  recommend ultrasound follow-up in 3 years per society for vascular surgery  guidelines.  2. 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  3. On the left, moderate disease in the superficial femoral and popliteal  arteries which remain patent; occlusion of the anterior tibial artery  proximally with dominant runoff via the peroneal artery; left posterior tibial  artery occluded in the mid calf with reconstitution at the ankle;  reconstitution of the anterior tibial artery at the ankle bilaterally   I discussed CT results with my attending, Dr.Glick, who agrees that CT findings are reassuring for outpatient follow up.   Patient discharged home in stable condition.  Patient will follow-up with cardiology and vascular surgery for  further evaluation as needed.   Logan Ubaldo NOVAK, PA-C 02/09/24 0343    Raford Lenis, MD 02/09/24 [phone]

## 2024-02-14 ENCOUNTER — Telehealth: Payer: Self-pay

## 2024-02-14 ENCOUNTER — Other Ambulatory Visit: Payer: Self-pay

## 2024-02-14 DIAGNOSIS — I739 Peripheral vascular disease, unspecified: Secondary | ICD-10-CM

## 2024-02-14 NOTE — Telephone Encounter (Signed)
 Patient called reporting 8/10 constant right hamstring pain and swelling. He is S/P right peripheral vascular thrombectomy on 02/01/24.  Dr.Clark consulted and order given for r le art duplex and ABI and PA visit.  Appointments were scheduled and patient is aware of appointment date, time and location.

## 2024-02-15 ENCOUNTER — Ambulatory Visit (HOSPITAL_COMMUNITY): Admission: RE | Admit: 2024-02-15

## 2024-02-15 ENCOUNTER — Emergency Department (EMERGENCY_DEPARTMENT_HOSPITAL)

## 2024-02-15 ENCOUNTER — Other Ambulatory Visit: Payer: Self-pay

## 2024-02-15 ENCOUNTER — Encounter (HOSPITAL_COMMUNITY): Payer: Self-pay

## 2024-02-15 ENCOUNTER — Emergency Department (EMERGENCY_DEPARTMENT_HOSPITAL)
Admission: EM | Admit: 2024-02-15 | Discharge: 2024-02-15 | Disposition: A | Discharge status: Home / Self Care | Source: Home / Self Care | Attending: Emergency Medicine | Admitting: Emergency Medicine

## 2024-02-15 ENCOUNTER — Emergency Department (HOSPITAL_COMMUNITY)
Admission: EM | Admit: 2024-02-15 | Discharge: 2024-02-15 | Disposition: A | Discharge status: Home / Self Care | Attending: Emergency Medicine | Admitting: Emergency Medicine

## 2024-02-15 ENCOUNTER — Emergency Department (HOSPITAL_COMMUNITY)

## 2024-02-15 ENCOUNTER — Ambulatory Visit (HOSPITAL_COMMUNITY)

## 2024-02-15 ENCOUNTER — Ambulatory Visit

## 2024-02-15 DIAGNOSIS — N189 Chronic kidney disease, unspecified: Secondary | ICD-10-CM | POA: Insufficient documentation

## 2024-02-15 DIAGNOSIS — Z7982 Long term (current) use of aspirin: Secondary | ICD-10-CM | POA: Diagnosis not present

## 2024-02-15 DIAGNOSIS — Z8673 Personal history of transient ischemic attack (TIA), and cerebral infarction without residual deficits: Secondary | ICD-10-CM | POA: Insufficient documentation

## 2024-02-15 DIAGNOSIS — Z951 Presence of aortocoronary bypass graft: Secondary | ICD-10-CM | POA: Diagnosis not present

## 2024-02-15 DIAGNOSIS — F17200 Nicotine dependence, unspecified, uncomplicated: Secondary | ICD-10-CM | POA: Insufficient documentation

## 2024-02-15 DIAGNOSIS — Z79899 Other long term (current) drug therapy: Secondary | ICD-10-CM | POA: Insufficient documentation

## 2024-02-15 DIAGNOSIS — I129 Hypertensive chronic kidney disease with stage 1 through stage 4 chronic kidney disease, or unspecified chronic kidney disease: Secondary | ICD-10-CM | POA: Insufficient documentation

## 2024-02-15 DIAGNOSIS — Z7901 Long term (current) use of anticoagulants: Secondary | ICD-10-CM | POA: Insufficient documentation

## 2024-02-15 DIAGNOSIS — I2510 Atherosclerotic heart disease of native coronary artery without angina pectoris: Secondary | ICD-10-CM | POA: Insufficient documentation

## 2024-02-15 DIAGNOSIS — T82848A Pain from vascular prosthetic devices, implants and grafts, initial encounter: Secondary | ICD-10-CM | POA: Diagnosis not present

## 2024-02-15 DIAGNOSIS — M79604 Pain in right leg: Secondary | ICD-10-CM | POA: Diagnosis present

## 2024-02-15 LAB — CBC WITH DIFFERENTIAL/PLATELET
Abs Immature Granulocytes: 0.03 K/uL (ref 0.00–0.07)
Basophils Absolute: 0.1 K/uL (ref 0.0–0.1)
Basophils Relative: 1 %
Eosinophils Absolute: 0.1 K/uL (ref 0.0–0.5)
Eosinophils Relative: 1 %
HCT: 39.3 % (ref 39.0–52.0)
Hemoglobin: 12.3 g/dL — ABNORMAL LOW (ref 13.0–17.0)
Immature Granulocytes: 0 %
Lymphocytes Relative: 28 %
Lymphs Abs: 2.6 K/uL (ref 0.7–4.0)
MCH: 28.9 pg (ref 26.0–34.0)
MCHC: 31.3 g/dL (ref 30.0–36.0)
MCV: 92.5 fL (ref 80.0–100.0)
Monocytes Absolute: 0.7 K/uL (ref 0.1–1.0)
Monocytes Relative: 8 %
Neutro Abs: 5.6 K/uL (ref 1.7–7.7)
Neutrophils Relative %: 62 %
Platelets: 436 K/uL — ABNORMAL HIGH (ref 150–400)
RBC: 4.25 MIL/uL (ref 4.22–5.81)
RDW: 15.6 % — ABNORMAL HIGH (ref 11.5–15.5)
WBC: 9.1 K/uL (ref 4.0–10.5)
nRBC: 0 % (ref 0.0–0.2)

## 2024-02-15 LAB — COMPREHENSIVE METABOLIC PANEL WITH GFR
ALT: 11 U/L (ref 0–44)
AST: 18 U/L (ref 15–41)
Albumin: 4 g/dL (ref 3.5–5.0)
Alkaline Phosphatase: 82 U/L (ref 38–126)
Anion gap: 11 (ref 5–15)
BUN: 22 mg/dL — ABNORMAL HIGH (ref 6–20)
CO2: 24 mmol/L (ref 22–32)
Calcium: 10.7 mg/dL — ABNORMAL HIGH (ref 8.9–10.3)
Chloride: 104 mmol/L (ref 98–111)
Creatinine, Ser: 1.35 mg/dL — ABNORMAL HIGH (ref 0.61–1.24)
GFR, Estimated: >60 mL/min (ref >60)
Glucose, Bld: 129 mg/dL — ABNORMAL HIGH (ref 70–99)
Potassium: 3.8 mmol/L (ref 3.5–5.1)
Sodium: 138 mmol/L (ref 135–145)
Total Bilirubin: 0.5 mg/dL (ref 0.0–1.2)
Total Protein: 8.8 g/dL — ABNORMAL HIGH (ref 6.5–8.1)

## 2024-02-15 MED ORDER — HYDROMORPHONE HCL 1 MG/ML IJ SOLN
1.0000 mg | Freq: Once | INTRAMUSCULAR | Status: AC
  Administered 2024-02-15: 09:00:00 1 mg via INTRAVENOUS
  Filled 2024-02-15: qty 1

## 2024-02-15 MED ORDER — ONDANSETRON HCL 4 MG/2ML IJ SOLN
4.0000 mg | Freq: Once | INTRAMUSCULAR | Status: AC
  Administered 2024-02-15: 09:00:00 4 mg via INTRAVENOUS

## 2024-02-15 MED ORDER — OXYCODONE-ACETAMINOPHEN 5-325 MG PO TABS: 1.0000 | ORAL_TABLET | Freq: Four times a day (QID) | ORAL | 0 refills | Status: DC | PRN

## 2024-02-15 MED ORDER — ONDANSETRON HCL 4 MG/2ML IJ SOLN: 4.0000 mg | INTRAMUSCULAR | Status: DC | PRN

## 2024-02-15 MED ORDER — HYDROMORPHONE HCL 1 MG/ML IJ SOLN
1.0000 mg | INTRAMUSCULAR | Status: DC | PRN
  Administered 2024-02-15: 11:00:00 1 mg via INTRAVENOUS
  Filled 2024-02-15: qty 1

## 2024-02-15 NOTE — ED Provider Notes (Signed)
  EMERGENCY DEPARTMENT AT [facility] Provider Note   CSN: [id number] Arrival date & time: 02/15/24  0100     Patient presents with: Leg Pain   Benjamin Espinoza is a 58 y.o. male.   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  He reports he has been having a lot of pain since that time particularly behind the knee and in the hamstring.  Pain has become intolerable.  He did contact vascular surgery and there is planned ABIs and arterial ultrasound today but the patient reports as of last night it was too severe so he came to the emergency department.  Denies that the foot is numb or painful.  The pain is really concentrating behind the leg at the level of the knee, upper portion of the lower leg and hamstring.    Prior to Admission medications  Medication Sig Start Date End Date Taking? Authorizing Provider  oxyCODONE -acetaminophen  (PERCOCET/ROXICET) 5-325 MG tablet Take 1-2 tablets by mouth every 6 (six) hours as needed for severe pain (pain score 7-10). 02/15/24  Yes Armenta Canning, MD  acetaminophen  (TYLENOL ) 500 MG tablet Take 1 tablet (500 mg total) by mouth every 6 (six) hours as needed for mild pain (pain score 1-3), headache or fever. 02/04/24   Harold Scholz, MD  aspirin  EC 81 MG tablet Take 1 tablet (81 mg total) by mouth daily. Swallow whole. 02/04/24   Harold Scholz, MD  atorvastatin  (LIPITOR ) 80 MG tablet Take 1 tablet (80 mg total) by mouth daily. 11/06/23   Dean Clarity, MD  carvedilol  (COREG ) 3.125 MG tablet Take 1 tablet (3.125 mg total) by mouth 2 (two) times daily with a meal. 02/04/24   Harold Scholz, MD  [Paused] cilostazol  (PLETAL ) 100 MG tablet Take 1 tablet (100 mg total) by mouth 2 (two) times daily. Patient not taking: Reported on 12/12/2023 Wait to take this until your doctor or other care provider tells you to start again. 11/06/23   Dean Clarity, MD  gabapentin  (NEURONTIN ) 300  MG capsule Take 1 capsule (300 mg total) by mouth 3 (three) times daily as needed (neuropathy, pain). 02/04/24   Harold Scholz, MD  isosorbide  mononitrate (IMDUR ) 30 MG 24 hr tablet Take 3 tablets (90 mg total) by mouth daily. 02/04/24   Harold Scholz, MD  lidocaine  (LIDODERM ) 5 % Place 1 patch onto the skin at bedtime. Remove & Discard patch within 12 hours or as directed by MD 02/04/24   Harold Scholz, MD  nicotine  (NICODERM CQ  - DOSED IN MG/24 HOURS) 14 mg/24hr patch Place 1 patch (14 mg total) onto the skin daily. 11/19/23   [name], [name], DO  nicotine  polacrilex (NICORETTE ) 4 MG gum Take 1 each (4 mg total) by mouth as needed for smoking cessation. Patient not taking: Reported on 01/31/2024 12/01/23   Kennyth Domino, FNP  nitroGLYCERIN  (NITROSTAT ) 0.4 MG SL tablet Place 1 tablet (0.4 mg total) under the tongue every 5 (five) minutes as needed for chest pain. Patient not taking: Reported on 01/31/2024 12/12/23   Rana Lum CROME, NP  nitroGLYCERIN  (NITROSTAT ) 0.4 MG SL tablet Dissolve 1 tablet (0.4 mg total) under the tongue every 5 (five) minutes as needed for chest pain. Do not exceed a total of 3 doses in 15 minutes. 12/12/23   Rana Lum CROME, NP  rivaroxaban  (XARELTO ) 20 MG TABS tablet Take 1 tablet (20 mg total) by mouth daily. 02/04/24   Harold Scholz, MD  traMADol  (ULTRAM ) 50 MG tablet Take 1 tablet (50 mg total) by mouth every 6 (six) hours as needed. 02/04/24 02/03/25  Arloa Folks D, NP    Allergies: Patient has no known allergies.    Review of Systems  Updated Vital Signs BP (!) 151/108 (BP Location: Right Arm)   Pulse 72   Temp 98.1 F (36.7 C) (Oral)   Resp 16   SpO2 96%   Physical Exam Constitutional:      Comments: Alert nontoxic clear mental status no respiratory distress.  Well in appearance.  HENT:     Mouth/Throat:     Pharynx: Oropharynx is clear.  Cardiovascular:     Rate and Rhythm: Normal rate and regular rhythm.  Pulmonary:     Effort: Pulmonary effort  is normal.     Breath sounds: Normal breath sounds.  Musculoskeletal:     Comments: Lower extremities are symmetric in appearance.  The right lower extremity does not show any significant edema.  The calf is soft and pliable.  The foot is warm and well-perfused without any edema.  Patient identifies his area of pain in the posterior upper leg in the central area of the hamstring.  This area is soft and pliable.  There is no induration or appearance of skin changes to suggest cellulitis.  Pain then extends from that area to about the popliteal fossa.  Knee does not show any significant effusion.  Skin:    General: Skin is warm and dry.  Neurological:     General: No focal deficit present.     Mental Status: He is oriented to person, place, and time.     Motor: No weakness.     Coordination: Coordination normal.  Psychiatric:        Mood and Affect: Mood normal.     (all labs ordered are listed, but only abnormal results are displayed) Labs Reviewed  CBC WITH DIFFERENTIAL/PLATELET - Abnormal; Notable for the following components:      Result Value   Hemoglobin 12.3 (*)    RDW 15.6 (*)    Platelets 436 (*)    All other components within normal limits  COMPREHENSIVE METABOLIC PANEL WITH GFR - Abnormal; Notable for the following components:   Glucose, Bld 129 (*)    BUN 22 (*)    Creatinine, Ser 1.35 (*)    Calcium  10.7 (*)    Total Protein 8.8 (*)    All other components within normal limits    EKG: None  Radiology: VAS US  ABI WITH/WO TBI Result Date: 02/15/2024  LOWER EXTREMITY DOPPLER STUDY Patient Name:  Benjamin Espinoza  Date of Exam:   02/15/2024 Medical Rec #: [id number]           Accession #:    [id number] Date of Birth: [birth year]           Patient Gender: M Patient Age:   58 years Exam Location:  [facility] Procedure:      VAS US  ABI WITH/WO TBI Referring Phys: [facility] Makenlee Mckeag --------------------------------------------------------------------------------   Indications: Pain from vascular device, implant or graft. High Risk Factors: Hypertension, hyperlipidemia, current smoker, prior MI,                    coronary artery disease, prior CVA. Other Factors: CKD, s/p CABG, polysubstance abuse,.  Vascular Interventions: 12/07/2019 Left femoral balloon angioplasty, 12/25/2019  Right fem-pop bypass with PTFE graft, 09/21/2022 redo                         right fem-pop bypass, 01/31/2024 mechanical thrombectomy                         of right fem-pop bypass & lysis catheter placement,                         02/01/2024 right fem-pop bypass stenting and balloon                         angioplasty. Comparison Study: Previous exam 08/26/2022 Performing Technologist: Ezzie Potters RVT, RDMS  Examination Guidelines: A complete evaluation includes at minimum, Doppler waveform signals and systolic blood pressure reading at the level of bilateral brachial, anterior tibial, and posterior tibial arteries, when vessel segments are accessible. Bilateral testing is considered an integral part of a complete examination. Photoelectric Plethysmograph (PPG) waveforms and toe systolic pressure readings are included as required and additional duplex testing as needed. Limited examinations for reoccurring indications may be performed as noted.  ABI Findings: +---------+------------------+-----+----------+--------+ Right    Rt Pressure (mmHg)IndexWaveform  Comment  +---------+------------------+-----+----------+--------+ Brachial 151                    triphasic          +---------+------------------+-----+----------+--------+ PTA      134               0.89 monophasic         +---------+------------------+-----+----------+--------+ DP       154               1.02 monophasic         +---------+------------------+-----+----------+--------+ Great Toe111               0.74 Normal             +---------+------------------+-----+----------+--------+  +---------+------------------+-----+---------+-------+ Left     Lt Pressure (mmHg)IndexWaveform Comment +---------+------------------+-----+---------+-------+ Brachial 142                    triphasic        +---------+------------------+-----+---------+-------+ PTA      122               0.81 biphasic         +---------+------------------+-----+---------+-------+ DP       140               0.93 biphasic         +---------+------------------+-----+---------+-------+ Great Toe97                0.64 Abnormal         +---------+------------------+-----+---------+-------+ +-------+-----------+-----------+------------+------------+ ABI/TBIToday's ABIToday's TBIPrevious ABIPrevious TBI +-------+-----------+-----------+------------+------------+ Right  1.02       0.74       0.27        absent       +-------+-----------+-----------+------------+------------+ Left   0.93       0.64       0.82        0.42         +-------+-----------+-----------+------------+------------+  Summary: Right: The right toe-brachial index is normal. Although ankle brachial indices are within normal limits (0.95-1.29), arterial Doppler waveforms at the ankle suggest some possible component of arterial occlusive disease. Left: Resting left ankle-brachial index  indicates mild left lower extremity arterial disease. The left toe-brachial index is mildly abnormal.  *See table(s) above for measurements and observations.     Preliminary    VAS US  LOWER EXTREMITY VENOUS (DVT) (7a-7p) Result Date: 02/15/2024  Lower Venous DVT Study Patient Name:  CELESTE CANDELAS  Date of Exam:   02/15/2024 Medical Rec #: [id number]           Accession #:    [id number] Date of Birth: 16-Sep-1965           Patient Gender: M Patient Age:   28 years Exam Location:  [facility] Procedure:      VAS US  LOWER EXTREMITY VENOUS (DVT) Referring Phys: [facility] Jedadiah Abdallah  --------------------------------------------------------------------------------  Indications: Pain.  Risk Factors: 09/21/2022 - REDO RIGHT LOWER EXTREMITY BYPASS USING GORE PROPATEN 6mm REMOVALBLE RING GRAFT 02/01/2024 - PERIPHERAL VASCULAR THROMBECTOMY. Limitations: Poor ultrasound/tissue interface. Comparison Study: No prior studies. Performing Technologist: Cordella Collet RVT  Examination Guidelines: A complete evaluation includes B-mode imaging, spectral Doppler, color Doppler, and power Doppler as needed of all accessible portions of each vessel. Bilateral testing is considered an integral part of a complete examination. Limited examinations for reoccurring indications may be performed as noted. The reflux portion of the exam is performed with the patient in reverse Trendelenburg.  +---------+---------------+---------+-----------+----------+--------------+ RIGHT    CompressibilityPhasicitySpontaneityPropertiesThrombus Aging +---------+---------------+---------+-----------+----------+--------------+ CFV      Full           Yes      Yes                                 +---------+---------------+---------+-----------+----------+--------------+ SFJ      Full                                                        +---------+---------------+---------+-----------+----------+--------------+ FV Prox  Full                                                        +---------+---------------+---------+-----------+----------+--------------+ FV Mid   Full                                                        +---------+---------------+---------+-----------+----------+--------------+ FV DistalFull                                                        +---------+---------------+---------+-----------+----------+--------------+ PFV      Full                                                        +---------+---------------+---------+-----------+----------+--------------+ POP  Full           Yes      Yes                                 +---------+---------------+---------+-----------+----------+--------------+ PTV      Full                                                        +---------+---------------+---------+-----------+----------+--------------+ PERO     Full                                                        +---------+---------------+---------+-----------+----------+--------------+ Arterial waveforms detected in the bypass graft are noted to be multiphasic. Waveforms detected in the posterior tibial and peroneal arteries are multiphasic. Waveforms detected in the anterior tibial artery is monophasic.  +----+---------------+---------+-----------+----------+--------------+ LEFTCompressibilityPhasicitySpontaneityPropertiesThrombus Aging +----+---------------+---------+-----------+----------+--------------+ CFV Full           Yes      Yes                                 +----+---------------+---------+-----------+----------+--------------+    Summary: RIGHT: - There is no evidence of deep vein thrombosis in the lower extremity.  - No cystic structure found in the popliteal fossa.  LEFT: - No evidence of common femoral vein obstruction.   *See table(s) above for measurements and observations.    Preliminary      Procedures   Medications Ordered in the ED  HYDROmorphone  (DILAUDID ) injection 1 mg (1 mg Intravenous Given 02/15/24 1051)  ondansetron  (ZOFRAN ) injection 4 mg (has no administration in time range)  HYDROmorphone  (DILAUDID ) injection 1 mg (1 mg Intravenous Given 02/15/24 0837)  ondansetron  (ZOFRAN ) injection 4 mg (4 mg Intravenous Given 02/15/24 0835)                                    Medical Decision Making Amount and/or Complexity of Data Reviewed Labs: ordered.  Risk Prescription drug management.   Patient presents as outlined.  He had prior arterial obstruction treated by vascular surgery with a graft and lysis  2 weeks ago.  At this time we will proceed with repeat imaging.  Patient clinically has a warm and perfused foot.  Will treat for pain with Dilaudid .  DVT study obtained given pain now being in the posterior aspect of the upper leg and popliteal fossa.  Pain at this time seems much less typical for claudication or arterial obstruction.  DVT study no acute findings.   ABIs have been ordered for today by Dr. Serene.  By messaging did request these be obtained and at this time no critical obstruction identified.  Given no critical obstructions plus physical exam which has a warm and well-perfused lower extremity, I do patient stable to continue following up on outpatient basis with vascular surgery.  Possibly this represents ongoing musculoskeletal pain postoperatively.  Nothing on the exam however suggest any compartment syndrome, cellulitis, abscess.  Will continue to treat  for pain with a course of Percocet and recommend close follow-up with careful return precautions reviewed.     Final diagnoses:  Leg pain, posterior, right    ED Discharge Orders          Ordered    oxyCODONE -acetaminophen  (PERCOCET/ROXICET) 5-325 MG tablet  Every 6 hours PRN        02/15/24 1430               Armenta Canning, MD 02/15/24 1437

## 2024-02-15 NOTE — ED Triage Notes (Signed)
 Pt BIB E[facility] with reports of right leg pain. Pt had 2 clots removed from this leg recently. (Right hamstring).

## 2024-02-15 NOTE — Progress Notes (Signed)
 Right lower extremity venous duplex has been completed. Preliminary results can be found in CV Proc through chart review.  Results were given to Dr. Armenta.  02/15/2024 8:58 AM Cathlyn Collet RVT

## 2024-02-15 NOTE — ED Notes (Signed)
 Patient states his leg has increased in pain, Let triage nurse know. Patient has leg resting on chair at the moment.

## 2024-02-15 NOTE — Discharge Instructions (Signed)
 1.  At this time your leg is not showing evidence of a clot in either the veins or the arteries.  You do have chronic arterial disease and need close monitoring by your vascular surgeon, Dr. Serene. 2.  You may still be experiencing musculoskeletal pain.  You have been prescribed Percocet to take for severe pain.  You may take this for the next several days but try to transition to Exer strength Tylenol .  You may apply warm moist heat 3.  Return to the emergency department for recheck if you have numbness, pain into your foot or lower leg or other concerning changes.

## 2024-02-15 NOTE — Progress Notes (Signed)
 ABI exam has been completed.  Preliminary results given to Dr. Armenta.  Results can be found under chart review under CV PROC. 02/15/2024 12:17 PM Lawren Sexson RVT, RDMS

## 2024-02-16 LAB — VAS US ABI WITH/WO TBI
Left ABI: 0.93
Right ABI: 1.02

## 2024-02-21 ENCOUNTER — Inpatient Hospital Stay: Payer: Self-pay

## 2024-02-21 NOTE — Progress Notes (Deleted)
 "  Patient name: Benjamin Espinoza Date of birth: 12-12-1965 Date of visit: 02/21/2024  Type of visit: Established Patient Office Visit   Subjective   Chief concern: No chief complaint on file.   Benjamin Espinoza is a 58 y.o. male with a history of NSTEMI, MI, CVA, PAD, PVD, HLD, aortic dilitation, substance use disorder (cocaine), unhousing status, chronic pain, who presents to [facility] clinic for ED follow up.  Patient recently underwent lysis and stenting of RLE on 02/01/2024 with Dr. Lanis Due to arterial limb ischemia.  He presented to the ED on 02/15/2024 due to pain of the right leg behind his knee and in the hamstring.  RLE ultrasound negative for DVT at that time.  ABIs completed which showed right ankle ABI within normal limits and left ankle with mild arterial disease.  Given there were no critical obstructions and physical exam at that time showed a warm and well-perfused left lower extremity, he was deemed stable for discharge and treated with a course of Percocet for pain control.  ROS: Negative unless otherwise listed in the HPI.  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 12/06/2023   Symptomatic anemia 11/18/2023   Chronic anticoagulation 11/18/2023   History of CVA (cerebrovascular accident) 11/18/2023   Gastrointestinal hemorrhage 11/18/2023   Positive fecal occult blood test 11/18/2023   Acute CVA (cerebrovascular accident) (HCC) 10/13/2023   Chronic kidney disease, stage 3a (HCC) 10/13/2023   Aortic dilatation 10/13/2023   Lumbar radiculopathy 07/05/2023   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/17/2023   Heart failure with mid-range ejection fraction (HFmEF) (HCC) 06/17/2023   Cocaine abuse (HCC) 06/16/2023   Unhoused person 06/16/2023   Chest pain  Unstable angina 06/15/2023   Chronic health problem 06/15/2023   Unstable angina  (HCC) 06/15/2023   Encounter for well adult exam with abnormal findings 04/08/2023   Chronic pain syndrome 03/29/2023   Acute left ankle pain 03/23/2023   Impaired mobility and ADLs 03/23/2023   Right leg pain 03/23/2023   Chronic bilateral low back pain with left-sided sciatica 01/11/2023   OAB (overactive bladder) 01/11/2023   Encounter for medication monitoring 01/10/2023   PAD (peripheral artery disease) 09/21/2022   Ischemic cardiomyopathy 12/17/2020   NSTEMI (non-ST elevated myocardial infarction) (HCC) 11/28/2020   Anxiety    Asthma    Sinus bradycardia 11/29/2019   Abnormal stress test    DDD (degenerative disc disease), lumbar 11/08/2019   Essential hypertension    Hx of CABG    Hyperlipidemia    Myocardial infarction (HCC)    Tobacco use disorder    PVD (peripheral vascular disease) 09/21/2019     Past Surgical History:  Procedure Laterality Date   ABDOMINAL AORTOGRAM W/LOWER EXTREMITY Bilateral 09/21/2019   Procedure: ABDOMINAL AORTOGRAM W/LOWER EXTREMITY;  Surgeon: Eliza Lonni GORMAN, MD;  Location: [facility] INVASIVE CV LAB;  Service: Cardiovascular;  Laterality: Bilateral;   ABDOMINAL AORTOGRAM W/LOWER EXTREMITY N/A 12/07/2019   Procedure: ABDOMINAL AORTOGRAM W/LOWER EXTREMITY;  Surgeon: Eliza Lonni GORMAN, MD;  Location: [facility] INVASIVE CV LAB;  Service: Cardiovascular;  Laterality: N/A;   ABDOMINAL AORTOGRAM W/LOWER EXTREMITY N/A 09/09/2022   Procedure: ABDOMINAL AORTOGRAM W/LOWER EXTREMITY;  Surgeon: Gretta Lonni PARAS, MD;  Location: [facility] INVASIVE CV LAB;  Service: Vascular;  Laterality: N/A;   ABDOMINAL AORTOGRAM W/LOWER EXTREMITY N/A 01/31/2024   Procedure: ABDOMINAL AORTOGRAM W/LOWER EXTREMITY;  Surgeon: Serene Gallus  W, MD;  Location: [facility] INVASIVE CV LAB;  Service: Cardiovascular;  Laterality: N/A;   CARDIAC CATHETERIZATION     COLONOSCOPY N/A 11/19/2023   Procedure: COLONOSCOPY;  Surgeon: Stacia Glendia BRAVO, MD;  Location: [facility] ENDOSCOPY;  Service: Gastroenterology;   Laterality: N/A;   CORONARY ARTERY BYPASS GRAFT     quadruple bypas 2017   CORONARY STENT INTERVENTION N/A 11/26/2019   Procedure: CORONARY STENT INTERVENTION;  Surgeon: Mady Bruckner, MD;  Location: [facility] INVASIVE CV LAB;  Service: Cardiovascular;  Laterality: N/A;   ESOPHAGOGASTRODUODENOSCOPY N/A 11/19/2023   Procedure: EGD (ESOPHAGOGASTRODUODENOSCOPY);  Surgeon: Stacia Glendia BRAVO, MD;  Location: [facility] ENDOSCOPY;  Service: Gastroenterology;  Laterality: N/A;   FEMORAL-POPLITEAL BYPASS GRAFT Right 12/25/2019   Procedure: BYPASS GRAFT FEMORAL- BELOW KNEE POPLITEAL ARTERY RIGHT USING GORE PROPATEN VASCULAR GRAFT;  Surgeon: Eliza Bruckner RAMAN, MD;  Location: [facility] OR;  Service: Vascular;  Laterality: Right;   FEMORAL-POPLITEAL BYPASS GRAFT Right 09/21/2022   Procedure: REDO RIGHT LOWER EXTREMITY BYPASS USING GORE PROPATEN 6mm REMOVALBLE RING GRAFT;  Surgeon: Eliza Bruckner RAMAN, MD;  Location: [facility] OR;  Service: Vascular;  Laterality: Right;   INTRAOPERATIVE ARTERIOGRAM Right 09/21/2022   Procedure: INTRA OPERATIVE ARTERIOGRAM;  Surgeon: Eliza Bruckner RAMAN, MD;  Location: [facility] OR;  Service: Vascular;  Laterality: Right;   LEFT HEART CATH AND CORS/GRAFTS ANGIOGRAPHY N/A 11/26/2019   Procedure: LEFT HEART CATH AND CORS/GRAFTS ANGIOGRAPHY;  Surgeon: Mady Bruckner, MD;  Location: [facility] INVASIVE CV LAB;  Service: Cardiovascular;  Laterality: N/A;   LEFT HEART CATH AND CORS/GRAFTS ANGIOGRAPHY N/A 12/01/2020   Procedure: LEFT HEART CATH AND CORS/GRAFTS ANGIOGRAPHY;  Surgeon: Burnard Debby LABOR, MD;  Location: [facility] INVASIVE CV LAB;  Service: Cardiovascular;  Laterality: N/A;   LEFT HEART CATH AND CORS/GRAFTS ANGIOGRAPHY N/A 06/16/2023   Procedure: LEFT HEART CATH AND CORS/GRAFTS ANGIOGRAPHY;  Surgeon: Jordan, Peter M, MD;  Location: [facility] INVASIVE CV LAB;  Service: Cardiovascular;  Laterality: N/A;   LOWER EXTREMITY ANGIOGRAM Right 12/25/2019   Procedure: RIGHT LOWER EXTREMITY ANGIOGRAM;  Surgeon: Eliza Bruckner RAMAN, MD;  Location: [facility] OR;  Service: Vascular;  Laterality: Right;   LOWER EXTREMITY INTERVENTION Right 02/01/2024   Procedure: LOWER EXTREMITY INTERVENTION;  Surgeon: Lanis Fonda BRAVO, MD;  Location: [facility] INVASIVE CV LAB;  Service: Cardiovascular;  Laterality: Right;   PERIPHERAL VASCULAR BALLOON ANGIOPLASTY Left 12/07/2019   Procedure: PERIPHERAL VASCULAR BALLOON ANGIOPLASTY;  Surgeon: Eliza Bruckner RAMAN, MD;  Location: [facility] INVASIVE CV LAB;  Service: Cardiovascular;  Laterality: Left;  SFA   PERIPHERAL VASCULAR THROMBECTOMY  01/31/2024   Procedure: PERIPHERAL VASCULAR THROMBECTOMY;  Surgeon: Serene Gaile ORN, MD;  Location: [facility] INVASIVE CV LAB;  Service: Cardiovascular;;   PERIPHERAL VASCULAR THROMBECTOMY N/A 02/01/2024   Procedure: PERIPHERAL VASCULAR THROMBECTOMY;  Surgeon: Lanis Fonda BRAVO, MD;  Location: [facility] INVASIVE CV LAB;  Service: Cardiovascular;  Laterality: N/A;     Current Outpatient Medications  Medication Instructions   acetaminophen  (TYLENOL ) 500 mg, Oral, Every 6 hours PRN   aspirin  EC 81 mg, Oral, Daily, Swallow whole.   atorvastatin  (LIPITOR ) 80 mg, Oral, Daily   carvedilol  (COREG ) 3.125 mg, Oral, 2 times daily with meals   [Paused] cilostazol  (PLETAL ) 100 mg, Oral, 2 times daily   gabapentin  (NEURONTIN ) 300 mg, Oral, 3 times daily PRN   isosorbide  mononitrate (IMDUR ) 90 mg, Oral, Daily   lidocaine  (LIDODERM ) 5 % 1 patch, Transdermal, Daily at bedtime, Remove & Discard patch within 12 hours or as directed by MD   nicotine  (NICODERM CQ  - DOSED  IN MG/24 HOURS) 14 mg, Transdermal, Daily   nicotine  polacrilex (NICORETTE ) 4 mg, Oral, As needed   nitroGLYCERIN  (NITROSTAT ) 0.4 MG SL tablet Dissolve 1 tablet (0.4 mg total) under the tongue every 5 (five) minutes as needed for chest pain. Do not exceed a total of 3 doses in 15 minutes.   nitroGLYCERIN  (NITROSTAT ) 0.4 mg, Sublingual, Every 5 min PRN   oxyCODONE -acetaminophen  (PERCOCET/ROXICET) 5-325 MG tablet 1-2 tablets,  Oral, Every 6 hours PRN   traMADol  (ULTRAM ) 50 mg, Oral, Every 6 hours PRN   Xarelto  20 mg, Oral, Daily    Social History[1]    Objective  There were no vitals filed for this visit.There is no height or weight on file to calculate BMI.   Physical Exam:   Constitutional: well-appearing *** sitting in exam chair, in no acute distress. Ambulates without use of assistance device *** HEENT: normocephalic atraumatic, mucous membranes moist Eyes: conjunctiva non-erythematous Neck: supple Cardiovascular: regular rate and rhythm, bilateral radial pulses 2+, bilateral dorsal pedal pulses 2+, brisk capillary refill bilateral feet and hands  Pulmonary/Chest: normal work of breathing on room air, lungs clear to auscultation bilaterally Abdominal: soft, non-tender, non-distended MSK: normal bulk and tone. Neurological: alert & oriented x 3, sensation intact bilateral feet to monofilament*** Skin: warm and dry, no ulcers or lesions on bilateral feet*** Psych: mood calm, behavior normal, thought content normal, judgement normal    {Labs (Optional):23779}  The ASCVD Risk score (Arnett DK, et al., 2019) failed to calculate for the following reasons:   Risk score cannot be calculated because patient has a medical history suggesting prior/existing ASCVD   * - Cholesterol units were assumed      Assessment & Plan  Problem List Items Addressed This Visit   None   No follow-ups on file.  Patient discussed with Dr. {imcattendings:33109}, who also saw and evaluated the patient.  Doyal Miyamoto, MD [facility]  PGY-1 02/21/2024, 10:31 AM      [1]  Social History Tobacco Use   Smoking status: Every Day    Types: Cigars, Cigarettes    Passive exposure: Current (Wife is a smoker)   Smokeless tobacco: Never   Tobacco comments:    as of 12/20/19 2 cigararettes/day  Vaping Use   Vaping status: Never Used  Substance Use Topics   Alcohol use: No   Drug use: Yes    Types: Marijuana,  Cocaine   "

## 2024-03-06 NOTE — Progress Notes (Signed)
 Pt attended 12/01/2023 [facility] Appointment.   Per initial f/u pt was reached out via phone and was left vm. Pt returned call and stated that he he no longer has a food need but mainly transportation and understood that from his last talk with his provider that  he needed to quit smoking and be on medication. Pt expressed how he knows he has insurance that will cover transportation for the appts but is having difficulty navigating that and due to that has missed a number of previously scheduled appts. Pt stated that he would like to continue care with [facility], so CHW to [facility] and requested for pt to be called to rescheduled missed appts and to assist pt with navigating transportation with insurance so that he is able to attend appts. Front desk stated that she will pass this request to the scheduler and pt will called and assisted as well as a callback will also be provided to this CHW to ensure connection was made and pt was helped.  Per chart review pt does have a PCP ([name] [name]; [facility]), insurance, and is a smoker. Pt's last appt with PCP was 12/13/2023. Pt does indicate transportation SDOH needs at this time.  Additional pt f/u to be scheduled at this time per health equity protocol to check in and ensure pt was able to get appt scheduled and attend.

## 2024-03-08 NOTE — Telephone Encounter (Signed)
 Rtn call to the patient in reference to transportation  no answer. LMOM for th patient to call back.  Copied from CRM [id number]. Topic: Appointments - Scheduling Inquiry for Clinic >> Mar 06, 2024 12:27 PM Benjamin Espinoza wrote: Reason for CRM: pt is needing a call back to help with scheduling an appt and also directing him with how to acquire transportation via his insurance carrier. Benjamin Espinoza stated that she would also like for someone to call her back to update her on whether Benjamin Espinoza was able to be scheduled and advised on getting an appt as well as transportation.   Benjamin Espinoza of [employer] [phone] who states she is aware that he's missed several apts due to lack of transportation and is needing to stay on top of his medications, and knows an appt is necessary to continue with his healthcare and medication.  [name] [phone] or [phone]

## 2024-03-14 NOTE — Congregational Nurse Program (Signed)
" °  Dept: [phone]   Congregational Nurse Program Note  Date of Encounter: 03/14/2024  Past Medical History: Past Medical History:  Diagnosis Date   Abnormal stress test    Acute left ankle pain 03/23/2023   Anxiety    Asthma    as a child   Atypical chest pain 05/07/2017   Chest pain  Unstable angina 06/15/2023   Chronic bilateral low back pain with left-sided sciatica 01/11/2023   Chronic health problem 06/15/2023   Chronic pain syndrome 03/29/2023   Cocaine abuse (HCC) 06/16/2023   Coronary artery disease    DDD (degenerative disc disease), lumbar 11/08/2019   Formatting of this note might be different from the original. 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 There is mild dilatation of the ascending aorta, measuring 43 mm. There is mild dilatation of the aortic root, measuring 44 mm.      Encounter for medication monitoring 01/10/2023   Encounter for well adult exam with abnormal findings 04/08/2023   Essential hypertension    Heart failure with mid-range ejection fraction (HFmEF) (HCC) 06/17/2023   06/16/23 transthoracic echocardiogram: Left Ventricle: Left ventricular ejection fraction 45 to 50%. The left ventricle has mildly decreased function. No left ventricular hypertrophy. 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 [place]    Hyperlipidemia    Impaired mobility and ADLs 03/23/2023   Ischemic cardiomyopathy 12/17/2020   Lumbar radiculopathy 07/05/2023   Myocardial infarction [facility]) 2017   NSTEMI (non-ST elevated myocardial infarction) (HCC) 11/28/2020   OAB (overactive bladder) 01/11/2023   PAD (peripheral artery disease) 09/21/2022   PVD (peripheral vascular disease) 09/21/2019   Right leg pain 03/23/2023   Shortness of breath    Sinus bradycardia 11/29/2019   Tobacco use disorder    Unhoused person 06/16/2023   Unstable angina (HCC) 06/15/2023     Encounter Details:  Community Questionnaire - 03/14/24 1115       Questionnaire   Ask client: Do you give verbal consent for me to treat you today? Yes    Student Assistance N/A    Location Patient Served  [facility]    Encounter Setting CN site    Population Status Housed    Insurance Medicare;Medicaid    Insurance/Financial Assistance Referral N/A    Medication Patient Medications Reviewed;Have Medication Insecurities    Medical Referrals Made Specialty Clinic   [facility]   Medical Appointment Completed N/A    Screenings CN Performed (remember to also record results) NA    CNP Interventions Stroke Education;Case Management    ED Visit Averted Yes          Client arrived to RN door asking for help. Client stated he is in desperate need of a recovery program. He has been told he is going to die if he doesn't get off the drugs. Client has called multiple places and they have no openings. RN called [employer]. Intake completed in office with RN. RN able to provide spiritual and emotional support. Client is thankful for help of RN.  "

## 2024-03-16 ENCOUNTER — Other Ambulatory Visit (HOSPITAL_COMMUNITY): Payer: Self-pay

## 2024-03-19 ENCOUNTER — Ambulatory Visit (HOSPITAL_COMMUNITY)

## 2024-03-19 ENCOUNTER — Encounter

## 2024-03-20 ENCOUNTER — Observation Stay (HOSPITAL_COMMUNITY)
Admission: EM | Admit: 2024-03-20 | Discharge: 2024-03-22 | Disposition: A | Discharge status: Home / Self Care | Attending: Emergency Medicine | Admitting: Emergency Medicine

## 2024-03-20 ENCOUNTER — Other Ambulatory Visit: Payer: Self-pay

## 2024-03-20 ENCOUNTER — Emergency Department (HOSPITAL_COMMUNITY)

## 2024-03-20 ENCOUNTER — Observation Stay (HOSPITAL_COMMUNITY)

## 2024-03-20 ENCOUNTER — Encounter (HOSPITAL_COMMUNITY): Payer: Self-pay

## 2024-03-20 DIAGNOSIS — E785 Hyperlipidemia, unspecified: Secondary | ICD-10-CM | POA: Insufficient documentation

## 2024-03-20 DIAGNOSIS — I110 Hypertensive heart disease with heart failure: Secondary | ICD-10-CM | POA: Diagnosis not present

## 2024-03-20 DIAGNOSIS — F1490 Cocaine use, unspecified, uncomplicated: Secondary | ICD-10-CM | POA: Diagnosis not present

## 2024-03-20 DIAGNOSIS — R4182 Altered mental status, unspecified: Secondary | ICD-10-CM | POA: Insufficient documentation

## 2024-03-20 DIAGNOSIS — N179 Acute kidney failure, unspecified: Secondary | ICD-10-CM | POA: Insufficient documentation

## 2024-03-20 DIAGNOSIS — Z7982 Long term (current) use of aspirin: Secondary | ICD-10-CM | POA: Insufficient documentation

## 2024-03-20 DIAGNOSIS — F17200 Nicotine dependence, unspecified, uncomplicated: Secondary | ICD-10-CM | POA: Insufficient documentation

## 2024-03-20 DIAGNOSIS — Z951 Presence of aortocoronary bypass graft: Secondary | ICD-10-CM | POA: Insufficient documentation

## 2024-03-20 DIAGNOSIS — I739 Peripheral vascular disease, unspecified: Secondary | ICD-10-CM | POA: Insufficient documentation

## 2024-03-20 DIAGNOSIS — R29898 Other symptoms and signs involving the musculoskeletal system: Secondary | ICD-10-CM | POA: Insufficient documentation

## 2024-03-20 DIAGNOSIS — G9389 Other specified disorders of brain: Secondary | ICD-10-CM | POA: Diagnosis not present

## 2024-03-20 DIAGNOSIS — I130 Hypertensive heart and chronic kidney disease with heart failure and stage 1 through stage 4 chronic kidney disease, or unspecified chronic kidney disease: Secondary | ICD-10-CM | POA: Insufficient documentation

## 2024-03-20 DIAGNOSIS — I639 Cerebral infarction, unspecified: Secondary | ICD-10-CM | POA: Diagnosis not present

## 2024-03-20 DIAGNOSIS — Z8673 Personal history of transient ischemic attack (TIA), and cerebral infarction without residual deficits: Secondary | ICD-10-CM | POA: Insufficient documentation

## 2024-03-20 DIAGNOSIS — M25511 Pain in right shoulder: Secondary | ICD-10-CM | POA: Diagnosis present

## 2024-03-20 DIAGNOSIS — I5020 Unspecified systolic (congestive) heart failure: Secondary | ICD-10-CM | POA: Insufficient documentation

## 2024-03-20 DIAGNOSIS — N189 Chronic kidney disease, unspecified: Secondary | ICD-10-CM | POA: Diagnosis not present

## 2024-03-20 DIAGNOSIS — I25709 Atherosclerosis of coronary artery bypass graft(s), unspecified, with unspecified angina pectoris: Secondary | ICD-10-CM | POA: Diagnosis not present

## 2024-03-20 DIAGNOSIS — R079 Chest pain, unspecified: Secondary | ICD-10-CM | POA: Diagnosis not present

## 2024-03-20 DIAGNOSIS — I6782 Cerebral ischemia: Secondary | ICD-10-CM | POA: Diagnosis not present

## 2024-03-20 DIAGNOSIS — I6389 Other cerebral infarction: Secondary | ICD-10-CM | POA: Diagnosis not present

## 2024-03-20 DIAGNOSIS — I7090 Unspecified atherosclerosis: Secondary | ICD-10-CM

## 2024-03-20 DIAGNOSIS — Z7901 Long term (current) use of anticoagulants: Secondary | ICD-10-CM | POA: Insufficient documentation

## 2024-03-20 DIAGNOSIS — I10 Essential (primary) hypertension: Secondary | ICD-10-CM | POA: Diagnosis present

## 2024-03-20 LAB — CBC
HCT: 39.9 % (ref 39.0–52.0)
Hemoglobin: 12.7 g/dL — ABNORMAL LOW (ref 13.0–17.0)
MCH: 29.5 pg (ref 26.0–34.0)
MCHC: 31.8 g/dL (ref 30.0–36.0)
MCV: 92.8 fL (ref 80.0–100.0)
Platelets: 310 K/uL (ref 150–400)
RBC: 4.3 MIL/uL (ref 4.22–5.81)
RDW: 15.9 % — ABNORMAL HIGH (ref 11.5–15.5)
WBC: 4.5 K/uL (ref 4.0–10.5)
nRBC: 0 % (ref 0.0–0.2)

## 2024-03-20 LAB — I-STAT CHEM 8, ED
BUN: 20 mg/dL (ref 6–20)
Calcium, Ion: 1.22 mmol/L (ref 1.15–1.40)
Chloride: 106 mmol/L (ref 98–111)
Creatinine, Ser: 1.3 mg/dL — ABNORMAL HIGH (ref 0.61–1.24)
Glucose, Bld: 82 mg/dL (ref 70–99)
HCT: 39 % (ref 39.0–52.0)
Hemoglobin: 13.3 g/dL (ref 13.0–17.0)
Potassium: 4.1 mmol/L (ref 3.5–5.1)
Sodium: 142 mmol/L (ref 135–145)
TCO2: 24 mmol/L (ref 22–32)

## 2024-03-20 LAB — TROPONIN T, HIGH SENSITIVITY
Troponin T High Sensitivity: 25 ng/L — ABNORMAL HIGH (ref 0–19)
Troponin T High Sensitivity: 25 ng/L — ABNORMAL HIGH (ref 0–19)
Troponin T High Sensitivity: 24 ng/L — ABNORMAL HIGH (ref 0–19)

## 2024-03-20 LAB — ECHOCARDIOGRAM COMPLETE
AR max vel: 3.49 cm2
AV Area VTI: 4.02 cm2
AV Area mean vel: 3.43 cm2
AV Mean grad: 5 mmHg
AV Peak grad: 8.1 mmHg
Ao pk vel: 1.42 m/s
Area-P 1/2: 3.07 cm2
Calc EF: 53.1 %
P 1/2 time: 137 ms
S' Lateral: 3.9 cm
Single Plane A2C EF: 53.6 %
Single Plane A4C EF: 51.6 %

## 2024-03-20 LAB — COMPREHENSIVE METABOLIC PANEL WITH GFR
ALT: 13 U/L (ref 0–44)
AST: 20 U/L (ref 15–41)
Albumin: 3.5 g/dL (ref 3.5–5.0)
Alkaline Phosphatase: 72 U/L (ref 38–126)
Anion gap: 8 (ref 5–15)
BUN: 19 mg/dL (ref 6–20)
CO2: 24 mmol/L (ref 22–32)
Calcium: 9.8 mg/dL (ref 8.9–10.3)
Chloride: 106 mmol/L (ref 98–111)
Creatinine, Ser: 1.2 mg/dL (ref 0.61–1.24)
GFR, Estimated: >60 mL/min
Glucose, Bld: 84 mg/dL (ref 70–99)
Potassium: 4.1 mmol/L (ref 3.5–5.1)
Sodium: 138 mmol/L (ref 135–145)
Total Bilirubin: 0.3 mg/dL (ref 0.0–1.2)
Total Protein: 7.2 g/dL (ref 6.5–8.1)

## 2024-03-20 LAB — DIFFERENTIAL
Abs Immature Granulocytes: 0.04 K/uL (ref 0.00–0.07)
Basophils Absolute: 0 K/uL (ref 0.0–0.1)
Basophils Relative: 1 %
Eosinophils Absolute: 0.1 K/uL (ref 0.0–0.5)
Eosinophils Relative: 3 %
Immature Granulocytes: 1 %
Lymphocytes Relative: 34 %
Lymphs Abs: 1.5 K/uL (ref 0.7–4.0)
Monocytes Absolute: 0.5 K/uL (ref 0.1–1.0)
Monocytes Relative: 11 %
Neutro Abs: 2.3 K/uL (ref 1.7–7.7)
Neutrophils Relative %: 50 %

## 2024-03-20 LAB — URINE DRUG SCREEN
Amphetamines: NEGATIVE
Barbiturates: NEGATIVE
Benzodiazepines: NEGATIVE
Cocaine: NEGATIVE
Fentanyl: NEGATIVE
Methadone Scn, Ur: NEGATIVE
Opiates: NEGATIVE
Tetrahydrocannabinol: NEGATIVE

## 2024-03-20 LAB — PROTIME-INR
INR: 1.4 — ABNORMAL HIGH (ref 0.8–1.2)
Prothrombin Time: 18 s — ABNORMAL HIGH (ref 11.4–15.2)

## 2024-03-20 LAB — ETHANOL: Alcohol, Ethyl (B): <15 mg/dL

## 2024-03-20 LAB — APTT: aPTT: 35 s (ref 24–36)

## 2024-03-20 MED ORDER — STROKE: EARLY STAGES OF RECOVERY BOOK
Freq: Once | Status: AC
  Filled 2024-03-20: qty 1

## 2024-03-20 MED ORDER — OXYCODONE HCL 5 MG PO TABS
5.0000 mg | ORAL_TABLET | Freq: Four times a day (QID) | ORAL | Status: DC | PRN
  Administered 2024-03-20 – 2024-03-22 (×7): 5 mg via ORAL
  Filled 2024-03-20 (×7): qty 1

## 2024-03-20 MED ORDER — DICLOFENAC SODIUM 1 % EX GEL
4.0000 g | Freq: Four times a day (QID) | CUTANEOUS | Status: DC
  Administered 2024-03-20 (×2): 4 g via TOPICAL
  Filled 2024-03-20 (×2): qty 100

## 2024-03-20 MED ORDER — ATORVASTATIN CALCIUM 80 MG PO TABS
80.0000 mg | ORAL_TABLET | Freq: Every day | ORAL | Status: DC
  Administered 2024-03-20 – 2024-03-22 (×3): 80 mg via ORAL
  Filled 2024-03-20: qty 2
  Filled 2024-03-20 (×2): qty 1

## 2024-03-20 MED ORDER — IOHEXOL 350 MG/ML SOLN
100.0000 mL | Freq: Once | INTRAVENOUS | Status: AC | PRN
  Administered 2024-03-20: 40 mL via INTRAVENOUS

## 2024-03-20 MED ORDER — LIDOCAINE 5 % EX PTCH
1.0000 | MEDICATED_PATCH | CUTANEOUS | Status: DC
  Administered 2024-03-20 – 2024-03-21 (×2): 1 via TRANSDERMAL
  Filled 2024-03-20 (×3): qty 1

## 2024-03-20 MED ORDER — RIVAROXABAN 20 MG PO TABS
20.0000 mg | ORAL_TABLET | Freq: Every day | ORAL | Status: DC
  Administered 2024-03-20 – 2024-03-21 (×2): 20 mg via ORAL
  Filled 2024-03-20: qty 2
  Filled 2024-03-20: qty 1

## 2024-03-20 MED ORDER — CARVEDILOL 3.125 MG PO TABS
3.1250 mg | ORAL_TABLET | Freq: Two times a day (BID) | ORAL | Status: DC
  Administered 2024-03-20 – 2024-03-22 (×4): 3.125 mg via ORAL
  Filled 2024-03-20 (×4): qty 1

## 2024-03-20 MED ORDER — NITROGLYCERIN 0.4 MG SL SUBL
0.4000 mg | SUBLINGUAL_TABLET | SUBLINGUAL | Status: DC | PRN
  Administered 2024-03-20 – 2024-03-21 (×3): 0.4 mg via SUBLINGUAL
  Filled 2024-03-20 (×2): qty 1

## 2024-03-20 MED ORDER — ISOSORBIDE MONONITRATE ER 30 MG PO TB24: 90.0000 mg | ORAL_TABLET | Freq: Every day | ORAL | Status: DC

## 2024-03-20 MED ORDER — ASPIRIN 81 MG PO TBEC
81.0000 mg | DELAYED_RELEASE_TABLET | Freq: Every day | ORAL | Status: DC
  Administered 2024-03-20 – 2024-03-22 (×3): 81 mg via ORAL
  Filled 2024-03-20 (×3): qty 1

## 2024-03-20 MED ORDER — ACETAMINOPHEN 500 MG PO TABS
1000.0000 mg | ORAL_TABLET | Freq: Three times a day (TID) | ORAL | Status: DC | PRN
  Administered 2024-03-20 – 2024-03-22 (×4): 1000 mg via ORAL
  Filled 2024-03-20 (×4): qty 2

## 2024-03-20 MED ORDER — IOHEXOL 350 MG/ML SOLN
100.0000 mL | Freq: Once | INTRAVENOUS | Status: AC | PRN
  Administered 2024-03-20: 100 mL via INTRAVENOUS

## 2024-03-20 MED ORDER — MORPHINE SULFATE (PF) 4 MG/ML IV SOLN
4.0000 mg | Freq: Once | INTRAVENOUS | Status: AC
  Administered 2024-03-20: 4 mg via INTRAVENOUS
  Filled 2024-03-20: qty 1

## 2024-03-20 MED ORDER — GABAPENTIN 300 MG PO CAPS
300.0000 mg | ORAL_CAPSULE | Freq: Three times a day (TID) | ORAL | Status: DC | PRN
  Administered 2024-03-20 (×2): 300 mg via ORAL
  Filled 2024-03-20 (×2): qty 1

## 2024-03-20 NOTE — Hospital Course (Addendum)
 Chest pain, arm tingling last night This am numb in right face, right elbow down  Neuro- admit for stroke workup  [facility], 9 months, started last week   Chest tightness, similar to chest pain prior to cabg in the past   Very similar symptoms to last stroke Tingling and some pain in the right should, tingling down right arm  Came to the hospital for the shoulder pain No other injury to explain  1 pack every 3 days Now 1-2 cigarrettes a day  1 week since drugs, last wed start progam, cocain smoke,  No etoh   Isorbide Xarelto  Carved [place] prn  Not taking atorvastatin  (deprescribed?)    Nocturia

## 2024-03-20 NOTE — ED Notes (Signed)
 Patient transported to MRI

## 2024-03-20 NOTE — Code Documentation (Signed)
 Stroke Response Nurse Documentation Code Documentation  Benjamin Espinoza is a 59 y.o. male arriving to [place]  via Private Vehicle on 03/20/2024 with past medical hx of CVA, CABG PVD. On Xarelto  (rivaroxaban ) daily. Code stroke was activated by ED.   Patient from home where he was LKW at 1900 last night and now complaining of rt sensory loss.   Stroke team at the bedside on patient arrival. Labs drawn and patient cleared for CT by Dr. Theadore. Patient to CT with team. NIHSS 2, see documentation for details and code stroke times. Patient with right leg weakness and right decreased sensation on exam. The following imaging was completed:  CT Head, CTA, and CTP. Patient is not a candidate for IV Thrombolytic due to Xarelto  use, outside of treatment window. Patient is not a candidate for IR due to LVO negative.   Care Plan:    No acute treatment/TIA alert: q2h x 12 hours NIHSS & VS, then q4h.  NPO until stroke swallow screen obtained.  .   Process Delays Noted: POV arrival.  Bedside handoff with ED RN [place].    Benjamin Espinoza  Stroke Response RN

## 2024-03-20 NOTE — ED Notes (Addendum)
 Pt activated as code stroke in triage, brought to CT 1 by triage nurse arriving in CT at 0645. Pt went to sleep feeling normal around 7pm last night, but woke up with right arm numbness, HA, dizziness at 430am. Per triage RN, pt seen by [facility] MD in triage. Christian PA and Pollina MD to CT at [phone]. Pt takes xerelto.

## 2024-03-20 NOTE — H&P (Addendum)
 " Date: 03/20/2024               Patient Name:  Benjamin Espinoza MRN: [id number]  DOB: [birth year] Age / Sex: 59 y.o., male   PCP: [name], [name], DO         Medical Service: Internal Medicine Teaching Service         Attending Physician: Dr. Shawn Sick, MD      First Contact: Dr. Doyal Miyamoto, MD    Second Contact: Dr. 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  Last night he had some chest pressure and tingling in his right arm before going to bed that resolved with nitroglycerin  sublingual.  This morning he woke up with right shoulder/neck pain with movement and tingling down his right arm.  He is currently at a drug rehab program which he started 1 week ago.  The symptoms are very similar to his previous stroke in 09/2023 so he told the staff and he was rushed to the hospital.  He denies any new or worsening symptoms but also has not seen any improvement in his symptoms other than some relief of his pain with morphine .  He denies any associated symptoms including vision changes, new headaches, new balance issues, new weakness in his upper or lower extremities, any new numbness in his lower extremities, continued chest pain.  He has significant lower extremity vascular disease and has decree sensation and baseline weakness in his right lower extremity that have not changed over the last few weeks.  He reports taking all  of his prescribed medicines but for some reason he has not had his atorvastatin .  Last cocaine use was 1 week ago.    ED Course: Presented as above mildly hypertensive but otherwise vital signs were stable and he was satting well on room air.  Lab work showed no significant abnormalities on CBC or CMP, UDS negative, alcohol undetectable, and troponin flat at 25 x2.  Code stroke was called and MRI eventually showed subacute/chronic area of cortex and white matter ischemia in the left parietal lobe without hemorrhagic transformation or mass effect.  Recommendation from neurology was admission for full stroke workup.  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/2023 Hyperlipidemia Polysubstance use disorder  Meds:  Aspirin  81 mg daily Carvedilol  3.125 mg twice daily Gabapentin  300 mg 3 times daily as needed Isosorbide  mononitrate 90 mg daily Rivaroxaban  20 mg daily Sublingual nitroglycerin  as needed  Prescribed medication but reports not taking it Atorvastatin  80 mg daily  Past Surgical History Past Surgical History:  Procedure Laterality Date   ABDOMINAL AORTOGRAM W/LOWER EXTREMITY Bilateral 09/21/2019   Procedure: ABDOMINAL AORTOGRAM W/LOWER EXTREMITY;  Surgeon: Eliza Lonni GORMAN, MD;  Location: [facility] INVASIVE CV LAB;  Service: Cardiovascular;  Laterality: Bilateral;   ABDOMINAL AORTOGRAM W/LOWER EXTREMITY N/A 12/07/2019   Procedure: ABDOMINAL AORTOGRAM W/LOWER EXTREMITY;  Surgeon: Eliza Lonni RAMAN, MD;  Location: [facility] INVASIVE CV LAB;  Service: Cardiovascular;  Laterality: N/A;   ABDOMINAL AORTOGRAM W/LOWER EXTREMITY N/A 09/09/2022   Procedure: ABDOMINAL AORTOGRAM W/LOWER EXTREMITY;  Surgeon: Gretta Lonni PARAS, MD;  Location: [facility] INVASIVE CV LAB;  Service: Vascular;  Laterality: N/A;   ABDOMINAL AORTOGRAM W/LOWER EXTREMITY  N/A 01/31/2024   Procedure: ABDOMINAL AORTOGRAM W/LOWER EXTREMITY;  Surgeon: Serene Gaile ORN, MD;  Location: [facility] INVASIVE CV LAB;  Service: Cardiovascular;  Laterality: N/A;   CARDIAC CATHETERIZATION     COLONOSCOPY N/A 11/19/2023   Procedure: COLONOSCOPY;  Surgeon: Stacia Glendia BRAVO, MD;  Location: [facility] ENDOSCOPY;  Service: Gastroenterology;  Laterality: N/A;   CORONARY ARTERY BYPASS GRAFT     quadruple bypas 2017   CORONARY STENT INTERVENTION N/A 11/26/2019   Procedure: CORONARY STENT INTERVENTION;  Surgeon: Mady Lonni, MD;  Location: [facility] INVASIVE CV LAB;  Service: Cardiovascular;  Laterality: N/A;   ESOPHAGOGASTRODUODENOSCOPY N/A 11/19/2023   Procedure: EGD (ESOPHAGOGASTRODUODENOSCOPY);  Surgeon: Stacia Glendia BRAVO, MD;  Location[facility] ENDOSCOPY;  Service: Gastroenterology;  Laterality: N/A;   FEMORAL-POPLITEAL BYPASS GRAFT Right 12/25/2019   Procedure: BYPASS GRAFT FEMORAL- BELOW KNEE POPLITEAL ARTERY RIGHT USING GORE PROPATEN VASCULAR GRAFT;  Surgeon: Eliza Lonni RAMAN, MD;  Location: [facility] OR;  Service: Vascular;  Laterality: Right;   FEMORAL-POPLITEAL BYPASS GRAFT Right 09/21/2022   Procedure: REDO RIGHT LOWER EXTREMITY BYPASS USING GORE PROPATEN 6mm REMOVALBLE RING GRAFT;  Surgeon: Eliza Lonni RAMAN, MD;  Location: [facility] OR;  Service: Vascular;  Laterality: Right;   INTRAOPERATIVE ARTERIOGRAM Right 09/21/2022   Procedure: INTRA OPERATIVE ARTERIOGRAM;  Surgeon: Eliza Lonni RAMAN, MD;  Location: [facility] OR;  Service: Vascular;  Laterality: Right;   LEFT HEART CATH AND CORS/GRAFTS ANGIOGRAPHY N/A 11/26/2019   Procedure: LEFT HEART CATH AND CORS/GRAFTS ANGIOGRAPHY;  Surgeon: Mady Lonni, MD;  Location: [facility] INVASIVE CV LAB;  Service: Cardiovascular;  Laterality: N/A;   LEFT HEART CATH AND CORS/GRAFTS ANGIOGRAPHY N/A 12/01/2020   Procedure: LEFT HEART CATH AND CORS/GRAFTS ANGIOGRAPHY;  Surgeon: Burnard Debby LABOR, MD;  Location: [facility] INVASIVE CV LAB;  Service: Cardiovascular;  Laterality:  N/A;   LEFT HEART CATH AND CORS/GRAFTS ANGIOGRAPHY N/A 06/16/2023   Procedure: LEFT HEART CATH AND CORS/GRAFTS ANGIOGRAPHY;  Surgeon: Jordan, Peter M, MD;  Location: [facility] INVASIVE CV LAB;  Service: Cardiovascular;  Laterality: N/A;   LOWER EXTREMITY ANGIOGRAM Right 12/25/2019   Procedure: RIGHT LOWER EXTREMITY ANGIOGRAM;  Surgeon: Eliza Lonni RAMAN, MD;  Location: [facility] OR;  Service: Vascular;  Laterality: Right;   LOWER EXTREMITY INTERVENTION Right 02/01/2024   Procedure: LOWER EXTREMITY INTERVENTION;  Surgeon: Lanis Fonda BRAVO, MD;  Location: [facility] INVASIVE CV LAB;  Service: Cardiovascular;  Laterality: Right;   PERIPHERAL VASCULAR BALLOON ANGIOPLASTY Left 12/07/2019   Procedure: PERIPHERAL VASCULAR BALLOON ANGIOPLASTY;  Surgeon: Eliza Lonni RAMAN, MD;  Location: [facility] INVASIVE CV LAB;  Service: Cardiovascular;  Laterality: Left;  SFA   PERIPHERAL VASCULAR THROMBECTOMY  01/31/2024   Procedure: PERIPHERAL VASCULAR THROMBECTOMY;  Surgeon: Serene Gaile ORN, MD;  Location: [facility] INVASIVE CV LAB;  Service: Cardiovascular;;   PERIPHERAL VASCULAR THROMBECTOMY N/A 02/01/2024   Procedure: PERIPHERAL VASCULAR THROMBECTOMY;  Surgeon: Lanis Fonda BRAVO, MD;  Location: [facility] INVASIVE CV LAB;  Service: Cardiovascular;  Laterality: N/A;    Social:  Currently living at rehab facility but previously living alone and had nurse aide at home, ambulates without aids, independent in ADLs  and IADLs. PCP: [name], [name], DO Substances: Prior 1/3 pack/day smoker, currently 1-2 cigarettes as of last week.  Prior regular cocaine use, last use 1 week ago.  Denies alcohol use.  Family History:  Family History  Problem Relation Age of Onset   Heart disease Mother    Heart disease Father    Heart disease Sister     Allergies: Allergies as of 03/20/2024   (No Known Allergies)    Review of Systems: A complete ROS was negative except as per HPI.   OBJECTIVE:   Physical Exam: Blood pressure (!) 137/106, pulse 63,  temperature 97.6 F (36.4 C), temperature source Oral, resp. rate 14, SpO2 100%.  Constitutional: Well-appearing adult male laying in bed. In no acute distress. HENT: Normocephalic, atraumatic,  Eyes: Sclera non-icteric, PERRL, EOM intact Cardio:Regular rate and rhythm. 2+ bilateral radial pulses.  Decreased bilateral dorsalis pedis pulses.  All 4 extremities warm and well-perfused. Pulm:Clear to auscultation bilaterally. Normal work of breathing on room air. FDX:Wzhjupcz for extremity edema. Skin:Warm and dry. Psych:Pleasant mood and affect.  Neuro: *MS: A&O x4. Follows multi-step commands.  *Speech: no dysarthria or aphasia, able to name and repeat. *CN:    I: Deferred   II,III: PERRLA, VFF by confrontation, optic discs not visualized 2/2 pupillary constriction   III,IV,VI: EOMI w/o nystagmus, no ptosis   V: Decreased sensation in the right from V1 to V3 to LT   VII: Eyelid closure was full.  Smile symmetric.   VIII: Hearing intact to voice   IX,X: Voice normal, palate elevates symmetrically    XI: SCM/trap 5/5 bilat   XII: Tongue protrudes midline, no atrophy or fasciculations  *Motor:   Normal bulk.  No tremor, rigidity or bradykinesia. No pronator drift.   Strength: Dlt Bic Tri WE WrF FgS Gr HF KnF KnE PlF DoF    Left 5 5 5 5 5 5 5 5 5 5 5 5     Right 5 5 5 5 5 5 5 5 5 5  4+ 4+   *Sensory: Intact to light touch, pinprick, temperature vibration in left upper and lower extremity, mildly decreased sensation in the right upper extremity, stable moderately decreased sensation of the right lower extremity.   No double-simultaneous extinction.  *Coordination:  Finger-to-nose, heel-to-shin, rapid alternating motions were intact. *Reflexes:  2+ and symmetric throughout without clonus; toes down-going bilat *Gait: deferred    Labs: CBC    Component Value Date/Time   WBC 4.5 03/20/2024 0643   RBC 4.30 03/20/2024 0643   HGB 13.3 03/20/2024 0648   HGB 14.2 04/09/2021 0904   HCT 39.0  03/20/2024 0648   HCT 41.6 04/09/2021 0904   PLT 310 03/20/2024 0643   PLT 231 04/09/2021 0904   MCV 92.8 03/20/2024 0643   MCV 92 04/09/2021 0904   MCH 29.5 03/20/2024 0643   MCHC 31.8 03/20/2024 0643   RDW 15.9 (H) 03/20/2024 0643   RDW 13.1 04/09/2021 0904   LYMPHSABS 1.5 03/20/2024 0643   LYMPHSABS 2.1 04/09/2021 0904   MONOABS 0.5 03/20/2024 0643   EOSABS 0.1 03/20/2024 0643   EOSABS 0.1 04/09/2021 0904   BASOSABS 0.0 03/20/2024 0643   BASOSABS 0.1 04/09/2021 0904     CMP     Component Value Date/Time   NA 142 03/20/2024 0648   NA 141 04/09/2021 0904   K 4.1 03/20/2024 0648   CL 106 03/20/2024 0648   CO2 24 03/20/2024 0643   GLUCOSE 82 03/20/2024 0648   BUN 20 03/20/2024  0648   BUN 13 04/09/2021 0904   CREATININE 1.30 (H) 03/20/2024 0648   CALCIUM  9.8 03/20/2024 0643   PROT 7.2 03/20/2024 0643   ALBUMIN 3.5 03/20/2024 0643   AST 20 03/20/2024 0643   ALT 13 03/20/2024 0643   ALKPHOS 72 03/20/2024 0643   BILITOT 0.3 03/20/2024 0643   GFRNONAA >60 03/20/2024 0643   GFRAA 99 11/13/2019 1359    Imaging: MR BRAIN WO CONTRAST Result Date: 03/20/2024 IMPRESSION: 1. Mostly subacute and chronic area of cortex and white matter ischemia in the left parietal lobe, but possible small volume diffusion restriction there. No malignant hemorrhagic transformation or mass effect. 2. No other acute intracranial abnormality. Chronic small vessel disease, possibly posttraumatic left temporal lobe encephalomalacia otherwise stable. Electronically signed by: Helayne Hurst MD 03/20/2024 09:29 AM EST RP Workstation: [id number]   CT CEREBRAL PERFUSION W CONTRAST Result Date: 03/20/2024 IMPRESSION: 1. Negative CT perfusion aside from small and symmetric volume of oligemia in the inferior frontal gyri - felt likely to be artifactual. 2. CTA today reported separately. Electronically signed by: Helayne Hurst MD 03/20/2024 07:30 AM EST RP Workstation: [id number]   CT Angio Chest/Abd/Pel for  Dissection W and/or Wo Contrast Result Date: 03/20/2024 IMPRESSION: 1. No acute findings in the chest, abdomen, or pelvis. Specifically, no evidence for acute intramural hematoma or dissection of the thoracoabdominal aorta. 2. Stable 4.7 cm ascending thoracic aortic aneurysm. Continued annual imaging follow-up recommended. 3. Stable 3.1 cm infrarenal abdominal aortic aneurysm. Recommend follow-up ultrasound every 3 years. 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 4. Stable 3.4 x 1.5 cm soft tissue density in the retrosternal mediastinum. This is likely related to prior CABG and may reflect scar or fluid within a pericardial recess. 5. Stable cortical lucency right iliac crest. This lesion was present on the study from 05/24/2020 suggesting benign etiology. Electronically Signed   By: Camellia Candle M.D.   On: 03/20/2024 07:25   CT ANGIO HEAD NECK W WO CM Result Date: 03/20/2024 IMPRESSION: 1. Generalized left MCA hypoenhancement, but No discrete emergent large vessel occlusion. Possible progressed intracranial atherosclerosis since August (see #2). This was discussed by telephone with Dr. Arora at 0720 hours on 03/20/2024, and he advises the patient also positive for Cocaine. So drug-related left MCA vasospasm is also a consideration. 2. Other moderate and severe widespread intracranial atherosclerosis appears stable aside from possibly progressed and severe atherosclerotic stenosis at the anterior genu of the right ICA siphon. 3. Comparatively mild extracranial atherosclerosis, no significant extracranial stenosis. Electronically signed by: Helayne Hurst MD 03/20/2024 07:24 AM EST RP Workstation: [id number]   CT HEAD CODE STROKE WO CONTRAST (LKW 0-4.5h, LVO 0-24h) Result Date: 03/20/2024 IMPRESSION: 1. Posterior left MCA territory ischemia appears progressed since September but non-acute by CT. No acute intracranial abnormality, ASPECTS 10. 2.  These results were communicated to Dr. Arora at 0701 hours on 03/20/2024 by text page via the [facility] messaging system. Electronically signed by: Helayne Hurst MD 03/20/2024 07:02 AM EST RP Workstation: [id number]     EKG: personally reviewed my interpretation is normal sinus rhythm. Consistent with prior EKG.  ASSESSMENT & PLAN:   Assessment & Plan by Problem: Principal Problem:   Stroke [facility]) Active Problems:   Essential hypertension   Hyperlipidemia   PAD (peripheral artery disease)   Heart failure with mid-range ejection fraction (HFmEF) (HCC)   Chronic anticoagulation   History of CVA (cerebrovascular accident)   Coronary artery disease involving coronary bypass graft  of native heart with angina pectoris   PASTOR SGRO is a 59 y.o. 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  Subacute left parietal ischemic stroke Prior CVA Symptoms stable and predominantly sensory in the right upper extremity and right face.  Mild motor weakness in the right lower extremity seem to be chronic and related to his vascular disease.  Adherent on medications including Xarelto  and aspirin  but for an unknown reason was not taking atorvastatin .  Last lipid panel from 09/2023 showed LDL of 193.  Neurology evaluated in the emergency department and recommended admission for further stroke workup.  Since the symptoms did start within the last 24 to 48 hours we will allow for permissive hypertension the rest of today and then normalize tomorrow especially since he had his amlodipine  and losartan  discontinued after his last admission in December 2025. - Continue Xarelto  20 mg daily and aspirin  81 mg daily - Resume atorvastatin  80 mg  daily - TTE, lipid panel, A1c - Admit with telemetry - PT/OT/SLP - Tylenol  1000 mg every 8 hours as needed, topical lidocaine  patch, topical Voltaren  gel for shoulder pain  Chest pain CAD s/p CABG HFmrEF Hypertension Chest pain has resolved and troponins are flat at 25 without any new EKG changes.  Unsure if his chest pain was a sign of possible low perfusion especially with his history of heart failure with moderately reduced ejection fraction may have caused some recrudescence of his stroke symptoms versus new subacute strokes as seen on MRI.  Chest pain likely related to his significant cardiac history and will monitor as above.  Amlodipine  and losartan  were held on discharge from the hospital in 01/2024 apparently due to normal to low blood pressures so if he remains hypertensive we will add these back after his isosorbide  with losartan  being first after that. - Page for any new or worsening chest pain and repeat EKG/troponins if this happens - Aspirin  and statin as above - Resume antihypertensives/GDMT tomorrow  PAD Significant vascular disease in his lower extremities with admission in December 2025 for critical limb ischemia where he underwent right lower extremity thrombectomy and right lower extremity bypass lysis and relining.  No new or worsening symptoms associated with his lower extremity but he does have baseline decreased sensation and mild weakness in his right lower extremity.  Extremities are warm and well-perfused with slightly diminished but palpable dorsalis pedis pulses bilaterally. - Continue aspirin , Xarelto , and statin as above  Diet: Heart Healthy VTE: DOAC Code: Full  Dispo: Admit patient to Observation with expected length of stay less than 2 midnights.  Signed: Fairy Pool, DO Internal Medicine Resident, PGY-3 Please contact the on call pager at [phone] for any urgent or emergent needs. 11:08 AM 03/20/2024 "

## 2024-03-20 NOTE — ED Provider Notes (Signed)
 " Benjamin Espinoza EMERGENCY DEPARTMENT AT [facility] Provider Note   CSN: [id number] Arrival date & time: 03/20/24  9377  An emergency department physician performed an initial assessment on this suspected stroke patient at [phone].  Patient presents with: Arm Numbness and Code Stroke   [name] is a 59 y.o. male with past medical history significant for peripheral vascular disease, hypertension, previous CABG, previous history of cocaine use, previous CVA, CKD who presents concern for right arm numbness on waking today at around 430.  He endorses some chest pain last night before bed, took a couple of nitroglycerin .  He denies any vision changes, weakness.  He reports he has been compliant with his blood thinner.  Code stroke activated from triage and patient and CT at time of initial evaluation.   HPI     Prior to Admission medications  Medication Sig Start Date End Date Taking? Authorizing Provider  acetaminophen  (TYLENOL ) 500 MG tablet Take 1 tablet (500 mg total) by mouth every 6 (six) hours as needed for mild pain (pain score 1-3), headache or fever. 02/04/24   Harold Scholz, MD  aspirin  EC 81 MG tablet Take 1 tablet (81 mg total) by mouth daily. Swallow whole. 02/04/24   Harold Scholz, MD  atorvastatin  (LIPITOR ) 80 MG tablet Take 1 tablet (80 mg total) by mouth daily. 11/06/23   Dean Clarity, MD  carvedilol  (COREG ) 3.125 MG tablet Take 1 tablet (3.125 mg total) by mouth 2 (two) times daily with a meal. 02/04/24   Harold Scholz, MD  [Paused] cilostazol  (PLETAL ) 100 MG tablet Take 1 tablet (100 mg total) by mouth 2 (two) times daily. Patient not taking: Reported on 12/12/2023 Wait to take this until your doctor or other care provider tells you to start again. 11/06/23   Dean Clarity, MD  gabapentin  (NEURONTIN ) 300 MG capsule Take 1 capsule (300 mg total) by mouth 3 (three) times daily as needed (neuropathy, pain). 02/04/24   Harold Scholz, MD  isosorbide  mononitrate (IMDUR )  30 MG 24 hr tablet Take 3 tablets (90 mg total) by mouth daily. 02/04/24   Harold Scholz, MD  lidocaine  (LIDODERM ) 5 % Place 1 patch onto the skin at bedtime. Remove & Discard patch within 12 hours or as directed by MD 02/04/24   Harold Scholz, MD  nicotine  (NICODERM CQ  - DOSED IN MG/24 HOURS) 14 mg/24hr patch Place 1 patch (14 mg total) onto the skin daily. 11/19/23   [name], [name], DO  nicotine  polacrilex (NICORETTE ) 4 MG gum Take 1 each (4 mg total) by mouth as needed for smoking cessation. Patient not taking: Reported on 01/31/2024 12/01/23   Kennyth Domino, FNP  nitroGLYCERIN  (NITROSTAT ) 0.4 MG SL tablet Place 1 tablet (0.4 mg total) under the tongue every 5 (five) minutes as needed for chest pain. Patient not taking: Reported on 01/31/2024 12/12/23   Rana Lum CROME, NP  nitroGLYCERIN  (NITROSTAT ) 0.4 MG SL tablet Dissolve 1 tablet (0.4 mg total) under the tongue every 5 (five) minutes as needed for chest pain. Do not exceed a total of 3 doses in 15 minutes. 12/12/23   Rana Lum CROME, NP  oxyCODONE -acetaminophen  (PERCOCET/ROXICET) 5-325 MG tablet Take 1-2 tablets by mouth every 6 (six) hours as needed for severe pain (pain score 7-10). 02/15/24   Armenta Canning, MD  rivaroxaban  (XARELTO ) 20 MG TABS tablet Take 1 tablet (20 mg total) by mouth daily. 02/04/24   Harold Scholz, MD  traMADol  (ULTRAM ) 50 MG tablet Take 1 tablet (50 mg total)  by mouth every 6 (six) hours as needed. 02/04/24 02/03/25  Arloa Folks D, NP    Allergies: Patient has no known allergies.    Review of Systems  All other systems reviewed and are negative.   Updated Vital Signs BP (!) 137/106 (BP Location: Left Arm)   Pulse 63   Temp 97.6 F (36.4 C) (Oral)   Resp 14   SpO2 100%   Physical Exam Vitals and nursing note reviewed.  Constitutional:      General: He is not in acute distress.    Appearance: Normal appearance.  HENT:     Head: Normocephalic and atraumatic.  Eyes:     General:        Right eye:  No discharge.        Left eye: No discharge.  Cardiovascular:     Rate and Rhythm: Normal rate and regular rhythm.     Heart sounds: No murmur heard.    No friction rub. No gallop.  Pulmonary:     Effort: Pulmonary effort is normal.     Breath sounds: Normal breath sounds.  Abdominal:     General: Bowel sounds are normal.     Palpations: Abdomen is soft.  Skin:    General: Skin is warm and dry.     Capillary Refill: Capillary refill takes less than 2 seconds.  Neurological:     Mental Status: He is alert and oriented to person, place, and time.     Comments: Cranial nerves II through XII grossly intact.  Intact finger-nose, intact heel-to-shin.  Romberg negative, gait normal.  Alert and oriented x3.  Moves all 4 limbs spontaneously, normal coordination.  No pronator drift.  Intact strength 5 out of 5 bilateral upper and lower extremities.  Only focal deficit noted on exam is subjective sensory deficit of the right arm compared to the left.  Psychiatric:        Mood and Affect: Mood normal.        Behavior: Behavior normal.     (all labs ordered are listed, but only abnormal results are displayed) Labs Reviewed  PROTIME-INR - Abnormal; Notable for the following components:      Result Value   Prothrombin Time 18.0 (*)    INR 1.4 (*)    All other components within normal limits  CBC - Abnormal; Notable for the following components:   Hemoglobin 12.7 (*)    RDW 15.9 (*)    All other components within normal limits  I-STAT CHEM 8, ED - Abnormal; Notable for the following components:   Creatinine, Ser 1.30 (*)    All other components within normal limits  TROPONIN T, HIGH SENSITIVITY - Abnormal; Notable for the following components:   Troponin T High Sensitivity 25 (*)    All other components within normal limits  APTT  DIFFERENTIAL  COMPREHENSIVE METABOLIC PANEL WITH GFR  ETHANOL  URINE DRUG SCREEN  TROPONIN T, HIGH SENSITIVITY    EKG: Benjamin Espinoza  Radiology: MR BRAIN WO  CONTRAST Result Date: 03/20/2024 EXAM: MRI BRAIN WITHOUT CONTRAST 03/20/2024 09:14:54 AM TECHNIQUE: Multiplanar multisequence MRI of the head/brain was performed without the administration of intravenous contrast. COMPARISON: CT head, CTA, CT perfusion earlier today. Brain MRI 10/13/2023. CLINICAL HISTORY: 58 year old male with acute neuro deficit, stroke suspected, and code stroke presentation this morning. FINDINGS: BRAIN AND VENTRICLES: Diffusion imaging demonstrates normal diffusion posteriorly in the left MCA territory, left parietal lobe cortex and subcortical white matter, with the area mostly facilitated on ADC.  A small rim of restricted diffusion is possible (series 2, image 37). Developing encephalomalacia is present in this region. Mild hemosiderin is noted. There is no regional mass effect. No other convincing diffusion restriction is identified. Widely scattered cerebral white matter T2 and FLAIR hyperintensity, chronic left lateral temporal lobe encephalomalacia, and comparatively mild T2 heterogeneity in the pons are stable. Deep gray nuclei also relatively spared. Chronic cerebral blood products including in the left occipital lobe, cerebellar vermis, appear stable. No mass. No midline shift. No hydrocephalus. The sella is unremarkable. Major vascular flow voids are stable. ORBITS: No significant abnormality. SINUSES AND MASTOIDS: Paranasal sinus mucosal thickening and opacification again noted. Visible mastoids and internal auditory structures appear stable and negative. BONES AND SOFT TISSUES: Normal marrow signal. No soft tissue abnormality. IMPRESSION: 1. Mostly subacute and chronic area of cortex and white matter ischemia in the left parietal lobe, but possible small volume diffusion restriction there. No malignant hemorrhagic transformation or mass effect. 2. No other acute intracranial abnormality. Chronic small vessel disease, possibly posttraumatic left temporal lobe encephalomalacia  otherwise stable. Electronically signed by: Helayne Hurst MD 03/20/2024 09:29 AM EST RP Workstation: [id number]   CT CEREBRAL PERFUSION W CONTRAST Result Date: 03/20/2024 EXAM: CT BRAIN PERFUSION 03/20/2024 07:10:38 AM TECHNIQUE: Cerebral perfusion analysis using computed tomography with contrast administration, including post-processing of parametric maps with determination of cerebral blood flow, cerebral blood volume, mean transit time and time-to-maximum. Automated exposure control, iterative reconstruction, and/or weight based adjustment of the mA/kV was utilized to reduce the radiation dose to as low as reasonably achievable. 40 mL of iohexol  (OMNIPAQUE ) 350 MG/ML injection was administered. COMPARISON: Benjamin Espinoza available. CLINICAL HISTORY: 59 year old male with stroke presentation today. Abnormal intracranial arteries on Computed Tomography Angiography. FINDINGS: CT PERFUSION: EXAM QUALITY: The examination is adequate with diagnostic perfusion maps. No significant motion artifact. Appropriate arterial inflow and venous outflow curves. CORE INFARCT (CBF<30% volume): 0 mL. No cerebral blood flow (CBF) or cerebral blood volume (CBV) parameter abnormality detected. TOTAL HYPOPERFUSION (Tmax>6s volume): 12 mL. However, symmetrically affecting both inferior frontal gyri and likely to be artifactual. PENUMBRA: Mismatch volume: probably zero as above. Mismatch ratio: Not applicable Location: Not applicable IMPRESSION: 1. Negative CT perfusion aside from small and symmetric volume of oligemia in the inferior frontal gyri - felt likely to be artifactual. 2. CTA today reported separately. Electronically signed by: Helayne Hurst MD 03/20/2024 07:30 AM EST RP Workstation: [id number]   CT Angio Chest/Abd/Pel for Dissection W and/or Wo Contrast Result Date: 03/20/2024 CLINICAL DATA:  Right arm numbness and chest pain. Clinical concern for acute aortic syndrome. EXAM: CT ANGIOGRAPHY CHEST, ABDOMEN AND PELVIS TECHNIQUE:  Non-contrast CT of the chest was initially obtained. Multidetector CT imaging through the chest, abdomen and pelvis was performed using the standard protocol during bolus administration of intravenous contrast. Multiplanar reconstructed images and MIPs were obtained and reviewed to evaluate the vascular anatomy. RADIATION DOSE REDUCTION: This exam was performed according to the departmental dose-optimization program which includes automated exposure control, adjustment of the mA and/or kV according to patient size and/or use of iterative reconstruction technique. CONTRAST:  OMNIPAQUE  IOHEXOL  350 MG/ML SOLN COMPARISON:  10/13/2023 FINDINGS: CTA CHEST FINDINGS Cardiovascular: Pre contrast imaging shows no hyperdense crescent in the wall of the thoracic aorta to suggest the presence of an acute intramural hematoma. Ascending thoracic aorta again measures 4.7 cm at the level of the sinuses of Valsalva. Ascending thoracic aorta measures 4 cm diameter as before. No dissection of the thoracic aorta. Status post  CABG. No large central pulmonary embolus in the main or lobar pulmonary arteries. Mediastinum/Nodes: No mediastinal lymphadenopathy. 3.4 x 1.5 cm soft tissue density in the retrosternal mediastinum is similar to prior not substantially changed when measured at the same level today as on the previous exam. There is no hilar lymphadenopathy. The esophagus has normal imaging features. There is no axillary lymphadenopathy. Lungs/Pleura: The lungs are clear without focal pneumonia, edema, pneumothorax or pleural effusion. Musculoskeletal: No worrisome lytic or sclerotic osseous abnormality. Stable midthoracic superior vertebral endplate compression deformity. Review of the MIP images confirms the above findings. CTA ABDOMEN AND PELVIS FINDINGS VASCULAR Aorta: Infrarenal abdominal aorta measures 3.1 cm maximum diameter today compared to 3.0 cm previously. Mild-to-moderate atherosclerotic calcification noted without  stenosis. Celiac: Patent without evidence of aneurysm, dissection, vasculitis or significant stenosis. SMA: Patent without evidence of aneurysm, dissection, vasculitis or significant stenosis. Renals: Both renal arteries are patent without evidence of aneurysm, dissection, vasculitis, fibromuscular dysplasia or significant stenosis. IMA: Patent without evidence of aneurysm, dissection, vasculitis or significant stenosis. Inflow: Common iliac arteries patent. External iliac arteries patent. 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 Veins: No obvious venous abnormality within the limitations of this arterial phase study. Review of the MIP images confirms the above findings. NON-VASCULAR Hepatobiliary: No suspicious focal abnormality within the liver parenchyma. Gallbladder is decompressed. No intrahepatic or extrahepatic biliary dilation. Pancreas: No focal mass lesion. No dilatation of the main duct. No intraparenchymal cyst. No peripancreatic edema. Spleen: No splenomegaly. No suspicious focal mass lesion. Adrenals/Urinary Tract: No adrenal nodule or mass. Kidneys unremarkable. No evidence for hydroureter. The urinary bladder appears normal for the degree of distention. Stomach/Bowel: Stomach is distended with food and fluid. Duodenum is normally positioned as is the ligament of Treitz. No small bowel wall thickening. No small bowel dilatation. The terminal ileum is normal. The appendix is best seen on coronal images and is unremarkable. No gross colonic mass. No colonic wall thickening. Lymphatic: There is no gastrohepatic or hepatoduodenal ligament lymphadenopathy. No retroperitoneal or mesenteric lymphadenopathy. No pelvic sidewall lymphadenopathy. Reproductive: The prostate gland and seminal vesicles are unremarkable. Other: No substantial intraperitoneal free fluid. Musculoskeletal: Cortical lucency right iliac crest is stable in the interval. This  lesion was present on the study from 05/24/2020 suggesting benign etiology. Review of the MIP images confirms the above findings. IMPRESSION: 1. No acute findings in the chest, abdomen, or pelvis. Specifically, no evidence for acute intramural hematoma or dissection of the thoracoabdominal aorta. 2. Stable 4.7 cm ascending thoracic aortic aneurysm. Continued annual imaging follow-up recommended. 3. Stable 3.1 cm infrarenal abdominal aortic aneurysm. Recommend follow-up ultrasound every 3 years. 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 4. Stable 3.4 x 1.5 cm soft tissue density in the retrosternal mediastinum. This is likely related to prior CABG and may reflect scar or fluid within a pericardial recess. 5. Stable cortical lucency right iliac crest. This lesion was present on the study from 05/24/2020 suggesting benign etiology. Electronically Signed   By: Camellia Candle M.D.   On: 03/20/2024 07:25   CT ANGIO HEAD NECK W WO CM Result Date: 03/20/2024 EXAM: CTA HEAD AND NECK WITHOUT AND WITH 03/20/2024 07:00:57 AM TECHNIQUE: CTA of the head and neck was performed without and with the administration of 100 mL iohexol  (OMNIPAQUE ) 350 MG/ML injection. Multiplanar 2D and/or 3D reformatted images are provided for review. Automated exposure control, iterative reconstruction, and/or weight based adjustment of  the mA/kV was utilized to reduce the radiation dose to as low as reasonably achievable. Stenosis of the internal carotid arteries measured using NASCET criteria. Dense right subclavian venous contrast streak artifact limits some detail of the proximal great vessels, especially the right CCA origin and proximal right subclavian artery. Suboptimal arterial contrast bolus. COMPARISON: Plain head CT 03/20/2024, previous CTA head and neck 08/14/last year. CLINICAL HISTORY: 59 year old male with acute neuro deficit, stroke suspected, upper extremity numbness,  previous left MCA infarct, previous sternotomy, and previous CABG. FINDINGS: CTA NECK: AORTIC ARCH AND ARCH VESSELS: No dissection or arterial injury. No significant stenosis of the brachiocephalic or subclavian arteries, except for proximal left subclavian mild atherosclerosis. CERVICAL CAROTID ARTERIES: Visible right CCA with no atherosclerosis or stenosis. Stable mild proximal right ICA calcified plaque without stenosis. Mild left CCA tortuosity without significant plaque or stenosis. Mild proximal left ICA calcified plaque without stenosis is stable. No dissection, arterial injury, or hemodynamically significant stenosis by NASCET criteria. CERVICAL VERTEBRAL ARTERIES: Late entry of the right vertebral artery into the cervical transverse foramen on series 5 image 241, normal variant. Mildly dominant right vertebral artery in the neck. Proximal left subclavian and mild left vertebral artery origin atherosclerosis, mild left V1 segment atherosclerosis, no cervical left vertebral artery stenosis. No dissection, arterial injury, or significant stenosis. LUNGS AND MEDIASTINUM: Visible central pulmonary arteries enhancing and grossly patent. Prior CABG. No acute finding in the visible upper chest. SOFT TISSUES: No acute abnormality. BONES: Prior sternotomy.  No acute abnormality. CTA HEAD: ANTERIOR CIRCULATION: Right ICA siphon moderate soft and calcified atherosclerosis. Moderate to severe right anterior genu ICA stenosis (series 5 image 138) appears acute but progressed since August. Left ICA siphon similar soft and calcified atherosclerosis with moderate to severe left petrous and cavernous segment junction stenosis appears stable from August. ICA termini remain patent. Nondominant left ACA A1 segment is stable, normal variation. Normal anterior communicating artery. Mild ACA branch irregularity with no branch occlusion identified. Right MCA branch irregularity is chronic, right MCA branches appear stable without  discrete occlusion. Left MCA M1 segment mild to moderate tortuosity and irregularity is chronic. Left MCA bifurcation is patent. However, there is generalized left MCA hypoenhancement now compared to August (series 12 image 24). No discrete emergent large vessel occlusion is identified. No aneurysm. POSTERIOR CIRCULATION: Moderate bilateral distal vertebral artery atherosclerotic appearing irregularity. PICA origins are normal. Moderate left vertebral V4 segment stenosis. Mild right V4 segment stenosis appears stable since last year. Similar mild to moderate basilar artery irregularity, mild basilar artery stenosis is stable. Bilateral PCA branches appear stable with irregularity but no proximal PCA occlusion. SCA and PCA origins remain patent. Diminutive or absent posterior communicating arteries. No aneurysm. OTHER: Insufficient contrast for evaluation of intracranial venous structures. IMPRESSION: 1. Generalized left MCA hypoenhancement, but No discrete emergent large vessel occlusion. Possible progressed intracranial atherosclerosis since August (see #2). This was discussed by telephone with Dr. Arora at 0720 hours on 03/20/2024, and he advises the patient also positive for Cocaine. So drug-related left MCA vasospasm is also a consideration. 2. Other moderate and severe widespread intracranial atherosclerosis appears stable aside from possibly progressed and severe atherosclerotic stenosis at the anterior genu of the right ICA siphon. 3. Comparatively mild extracranial atherosclerosis, no significant extracranial stenosis. Electronically signed by: Helayne Hurst MD 03/20/2024 07:24 AM EST RP Workstation: [id number]   CT HEAD CODE STROKE WO CONTRAST (LKW 0-4.5h, LVO 0-24h) Result Date: 03/20/2024 EXAM: CT HEAD WITHOUT CONTRAST 03/20/2024 06:40:00 AM TECHNIQUE: CT of  the head was performed without the administration of intravenous contrast. Automated exposure control, iterative reconstruction, and/or weight based  adjustment of the mA/kV was utilized to reduce the radiation dose to as low as reasonably achievable. COMPARISON: Brain MRI 10/13/2023, CT head 11/18/2023. CLINICAL HISTORY: 59 year old male. Code stroke presentation, upper extremity numbness. FINDINGS: BRAIN AND VENTRICLES: No acute hemorrhage. No evidence of acute infarct. No hydrocephalus. No extra-axial collection. No mass effect or midline shift. Stable brain volume. Chronic left lateral temporal lobe encephalomalacia, thought to be posttraumatic based on previous MRI. Addressed but nonacute appearing cortical and subcortical white matter encephalomalacia in the superior left perirolandic area (series 3 image 23). Stable gray white differentiation elsewhere. Patchy chronic white matter hypodensity in both hemispheres. No suspicious intracranial vascular hyperdensity. Calcified atherosclerosis at the skull base. ORBITS: No acute abnormality. SINUSES: Rest of the sphenoid paranasal sinus opacification, mucosal thickening. Tympanic cavities and mastoids are well aerated. SOFT TISSUES AND SKULL: Benign left occipital convexity scalp lipoma incidentally noted and stable. No acute soft tissue abnormality. No skull fracture. ALBERTA STROKE PROGRAM EARLY CT SCORE (ASPECTS): Ganglionic (caudate, IC, lentiform nucleus, insula, M1-M3): 7. Supraganglionic (M4-M6): 3. Total: 10. IMPRESSION: 1. Posterior left MCA territory ischemia appears progressed since September but non-acute by CT. No acute intracranial abnormality, ASPECTS 10. 2. These results were communicated to Dr. Arora at 0701 hours on 03/20/2024 by text page via the [facility] messaging system. Electronically signed by: Helayne Hurst MD 03/20/2024 07:02 AM EST RP Workstation: [id number]     Procedures   Medications Ordered in the ED  iohexol  (OMNIPAQUE ) 350 MG/ML injection 100 mL (100 mLs Intravenous Contrast Given 03/20/24 0701)  iohexol  (OMNIPAQUE ) 350 MG/ML injection 100 mL (40 mLs Intravenous Contrast Given  03/20/24 0711)  morphine  (PF) 4 MG/ML injection 4 mg (4 mg Intravenous Given 03/20/24 0820)    Clinical Course as of 03/20/24 1005  Tue Mar 20, 2024  0728 Per neurology, suspicion for cocaine related, pending MR, if new stroke findings, admission to hospital for new stroke workup, if no acute findings, likely stable for discharge [CP]    Clinical Course User Index [CP] Benjamin Espinoza DEL, PA-C                                 Medical Decision Making Amount and/or Complexity of Data Reviewed Radiology: ordered.  Risk Prescription drug management.   This patient is a 59 y.o. male  who presents to the ED for concern of stroke like symptoms, chest pain.   Differential diagnoses prior to evaluation: The emergent differential diagnosis includes, but is not limited to, CVA, seizure, rescrudescence of previous stroke, ACS, AAS, PE, Mallory-Weiss, Boerhaave's, Pneumonia, acute bronchitis, asthma or COPD exacerbation, anxiety, MSK pain or traumatic injury to the chest, acid reflux versus other. This is not an exhaustive differential.   Past Medical History / Co-morbidities / Social History: peripheral vascular disease, hypertension, previous CABG, previous history of cocaine use, previous CVA, CKD  Additional history: Chart reviewed. Pertinent results include: Lab work, imaging from previous emergency department visits, hospital admissions  Physical Exam: Physical exam performed. The pertinent findings include: Blood pressure elevated, 136/90 on arrival.,  Vital signs overall otherwise stable, he has some subjective right face numbness, numbness of the right elbow down to the right hand,  Lab Tests/Imaging studies: I personally interpreted labs/imaging and the pertinent results include: CBC with mild anemia, hemoglobin 12.7.  CMP unremarkable, initial troponin is elevated  at 25, nonspecific, given his history of cocaine use, do not suspect any acute ACS at this time, but will obtain delta.   Ethanol negative.  CT angio chest abdomen pelvis shows 1. No acute findings in the chest, abdomen, or pelvis. Specifically,  no evidence for acute intramural hematoma or dissection of the  thoracoabdominal aorta.  2. Stable 4.7 cm ascending thoracic aortic aneurysm. Continued  annual imaging follow-up recommended.  3. Stable 3.1 cm infrarenal abdominal aortic aneurysm. Recommend  follow-up ultrasound every 3 years. Chronic occlusion right internal  iliac artery with marked multifocal stenosis left internal iliac  artery.  4. Stable 3.4 x 1.5 cm soft tissue density in the retrosternal  mediastinum. This is likely related to prior CABG and may reflect  scar or fluid within a pericardial recess.  5. Stable cortical lucency right iliac crest. This lesion was  present on the study from 05/24/2020 suggesting benign etiology.   CTA: 1. Generalized left MCA hypoenhancement, but No discrete emergent large vessel  occlusion. Possible progressed intracranial atherosclerosis since August (see  #2). This was discussed by telephone with Dr. Arora at 0720 hours on  03/20/2024, and he advises the patient also positive for Cocaine. So  drug-related left MCA vasospasm is also a consideration.  2. Other moderate and severe widespread intracranial atherosclerosis appears  stable aside from possibly progressed and severe atherosclerotic stenosis at  the anterior genu of the right ICA siphon.  3. Comparatively mild extracranial atherosclerosis, no significant extracranial  stenosis.    Mr Brain shows:  1. Mostly subacute and chronic area of cortex and white matter ischemia in the  left parietal lobe, but possible small volume diffusion restriction there. No  malignant hemorrhagic transformation or mass effect.  2. No other acute intracranial abnormality. Chronic small vessel disease,  possibly posttraumatic left temporal lobe encephalomalacia otherwise stable.  . I agree with the radiologist  interpretation.  Cardiac monitoring: EKG obtained and interpreted by myself and attending physician which shows: Normal sinus rhythm, some nonspecific T change in anterolateral leads, no acute ST changes   Medications: I ordered medication including morphine  for pain.  Continue Xarelto , stroke workup per neurology  Consults: Spoke with Dr. Deedra with neurology who recommends admission for stroke workup.  I spoke with Dr. Jolaine with internal medicine teaching service who agrees to admission.   Disposition: After consideration of the diagnostic results and the patients response to treatment, I feel that patient has some subacute changes in the left parietal lobe, plan for admission for stroke workup  Final diagnoses:  Benjamin Espinoza    ED Discharge Orders     Benjamin Espinoza          Benjamin Espinoza DEL, PA-C 03/20/24 1005  "

## 2024-03-20 NOTE — ED Triage Notes (Signed)
 Pt has a very significant health Hx involving cardiac, vascular and brain. He comes in today for right arm numbness that started at 0430, he mentions that he had chest pain last night before bed and took a couple nitroglycerin  before then. Pt has no grip strength loss, no numbness in the right leg, but numbness in the right arm coming down from the shoulder. No facial droop and nop numbness in the face. He is otherwise stable at this time and describes the numbness as his arm feeling asleep.

## 2024-03-20 NOTE — ED Notes (Signed)
 Pt back from MRI

## 2024-03-20 NOTE — Consult Note (Signed)
 NEUROLOGY CONSULT NOTE   Date of service: March 20, 2024 Patient Name: Benjamin Espinoza MRN:  [id number] DOB:  15-Aug-1965 Chief Complaint: Code stroke, right arm numbness Requesting Provider: Patsey Lot, MD  History of Present Illness  Benjamin Espinoza is a 59 y.o. male with hx of prior strokes involving the right side with no residual deficits, peripheral vascular disease, coronary artery disease status post CABG, cocaine abuse, hyperlipidemia, tobacco use disorder, presents for evaluation of sudden onset of right arm tingling and numbness that he discovered upon waking up at 430 this morning.  Also reports chest discomfort. His last known well was sometime last night around 7pm.  He has had a prior stroke with right-sided deficits and that is what made him concerned and come to the hospital.  He was evaluated in the emergency department and initially said last known well at 4:30 AM, which is the reason why the code stroke was activated. Reports he is in recovery program and has not been doing cocaine Reports quitting smoking 3 days ago Currently on Xarelto  for PVD  LKW: 7 PM on 03/19/2024 Modified rankin score: 0-Completely asymptomatic and back to baseline post- stroke IV Thrombolysis: Outside the window-also on Xarelto  EVT: No ELVO  NIHSS components Score: Comment  1a Level of Conscious 0[x]  1[]  2[]  3[]      1b LOC Questions 0[x]  1[]  2[]       1c LOC Commands 0[x]  1[]  2[]       2 Best Gaze 0[x]  1[]  2[]       3 Visual 0[x]  1[]  2[]  3[]      4 Facial Palsy 0[x]  1[]  2[]  3[]      5a Motor Arm - left 0[x]  1[]  2[]  3[]  4[]  UN[]    5b Motor Arm - Right 0[x]  1[]  2[]  3[]  4[]  UN[]    6a Motor Leg - Left 0[x]  1[]  2[]  3[]  4[]  UN[]    6b Motor Leg - Right 0[x]  1[]  2[]  3[]  4[]  UN[]    7 Limb Ataxia 0[x]  1[]  2[]  UN[]      8 Sensory 0[]  1[x]  2[]  UN[]      9 Best Language 0[x]  1[]  2[]  3[]      10 Dysarthria 0[x]  1[]  2[]  UN[]      11 Extinct. and Inattention 0[x]  1[]  2[]       TOTAL: 1      ROS   Comprehensive ROS performed and pertinent positives documented in HPI    Past History   Past Medical History:  Diagnosis Date   Abnormal stress test    Acute left ankle pain 03/23/2023   Anxiety    Asthma    as a child   Atypical chest pain 05/07/2017   Chest pain  Unstable angina 06/15/2023   Chronic bilateral low back pain with left-sided sciatica 01/11/2023   Chronic health problem 06/15/2023   Chronic pain syndrome 03/29/2023   Cocaine abuse (HCC) 06/16/2023   Coronary artery disease    DDD (degenerative disc disease), lumbar 11/08/2019   Formatting of this note might be different from the original. 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 There is mild dilatation of the ascending aorta, measuring 43 mm. There is mild dilatation of the aortic root, measuring 44 mm.      Encounter for medication monitoring 01/10/2023   Encounter for well adult exam with abnormal findings 04/08/2023   Essential hypertension    Heart failure with mid-range ejection fraction (HFmEF) (HCC) 06/17/2023   06/16/23 transthoracic echocardiogram: Left  Ventricle: Left ventricular ejection fraction 45 to 50%. The left ventricle has mildly decreased function. No left ventricular hypertrophy. 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 [place]    Hyperlipidemia    Impaired mobility and ADLs 03/23/2023   Ischemic cardiomyopathy 12/17/2020   Lumbar radiculopathy 07/05/2023   Myocardial infarction [facility]) 2017   NSTEMI (non-ST elevated myocardial infarction) (HCC) 11/28/2020   OAB (overactive bladder) 01/11/2023   PAD (peripheral artery disease) 09/21/2022   PVD (peripheral vascular disease) 09/21/2019   Right leg pain 03/23/2023   Shortness of breath    Sinus bradycardia 11/29/2019   Tobacco use disorder    Unhoused person 06/16/2023   Unstable angina (HCC) 06/15/2023    Past Surgical History:   Procedure Laterality Date   ABDOMINAL AORTOGRAM W/LOWER EXTREMITY Bilateral 09/21/2019   Procedure: ABDOMINAL AORTOGRAM W/LOWER EXTREMITY;  Surgeon: Eliza Lonni RAMAN, MD;  Location: [facility] INVASIVE CV LAB;  Service: Cardiovascular;  Laterality: Bilateral;   ABDOMINAL AORTOGRAM W/LOWER EXTREMITY N/A 12/07/2019   Procedure: ABDOMINAL AORTOGRAM W/LOWER EXTREMITY;  Surgeon: Eliza Lonni RAMAN, MD;  Location: [facility] INVASIVE CV LAB;  Service: Cardiovascular;  Laterality: N/A;   ABDOMINAL AORTOGRAM W/LOWER EXTREMITY N/A 09/09/2022   Procedure: ABDOMINAL AORTOGRAM W/LOWER EXTREMITY;  Surgeon: Gretta Lonni PARAS, MD;  Location: [facility] INVASIVE CV LAB;  Service: Vascular;  Laterality: N/A;   ABDOMINAL AORTOGRAM W/LOWER EXTREMITY N/A 01/31/2024   Procedure: ABDOMINAL AORTOGRAM W/LOWER EXTREMITY;  Surgeon: Serene Gaile ORN, MD;  Location: [facility] INVASIVE CV LAB;  Service: Cardiovascular;  Laterality: N/A;   CARDIAC CATHETERIZATION     COLONOSCOPY N/A 11/19/2023   Procedure: COLONOSCOPY;  Surgeon: Stacia Glendia BRAVO, MD;  Location: [facility] ENDOSCOPY;  Service: Gastroenterology;  Laterality: N/A;   CORONARY ARTERY BYPASS GRAFT     quadruple bypas 2017   CORONARY STENT INTERVENTION N/A 11/26/2019   Procedure: CORONARY STENT INTERVENTION;  Surgeon: Mady Lonni, MD;  Location: [facility] INVASIVE CV LAB;  Service: Cardiovascular;  Laterality: N/A;   ESOPHAGOGASTRODUODENOSCOPY N/A 11/19/2023   Procedure: EGD (ESOPHAGOGASTRODUODENOSCOPY);  Surgeon: Stacia Glendia BRAVO, MD;  Location: [facility] ENDOSCOPY;  Service: Gastroenterology;  Laterality: N/A;   FEMORAL-POPLITEAL BYPASS GRAFT Right 12/25/2019   Procedure: BYPASS GRAFT FEMORAL- BELOW KNEE POPLITEAL ARTERY RIGHT USING GORE PROPATEN VASCULAR GRAFT;  Surgeon: Eliza Lonni RAMAN, MD;  Location: [facility] OR;  Service: Vascular;  Laterality: Right;   FEMORAL-POPLITEAL BYPASS GRAFT Right 09/21/2022   Procedure: REDO RIGHT LOWER EXTREMITY BYPASS USING GORE PROPATEN 6mm REMOVALBLE RING  GRAFT;  Surgeon: Eliza Lonni RAMAN, MD;  Location: [facility] OR;  Service: Vascular;  Laterality: Right;   INTRAOPERATIVE ARTERIOGRAM Right 09/21/2022   Procedure: INTRA OPERATIVE ARTERIOGRAM;  Surgeon: Eliza Lonni RAMAN, MD;  Location: [facility] OR;  Service: Vascular;  Laterality: Right;   LEFT HEART CATH AND CORS/GRAFTS ANGIOGRAPHY N/A 11/26/2019   Procedure: LEFT HEART CATH AND CORS/GRAFTS ANGIOGRAPHY;  Surgeon: Mady Lonni, MD;  Location: [facility] INVASIVE CV LAB;  Service: Cardiovascular;  Laterality: N/A;   LEFT HEART CATH AND CORS/GRAFTS ANGIOGRAPHY N/A 12/01/2020   Procedure: LEFT HEART CATH AND CORS/GRAFTS ANGIOGRAPHY;  Surgeon: Burnard Debby LABOR, MD;  Location: [facility] INVASIVE CV LAB;  Service: Cardiovascular;  Laterality: N/A;   LEFT HEART CATH AND CORS/GRAFTS ANGIOGRAPHY N/A 06/16/2023   Procedure: LEFT HEART CATH AND CORS/GRAFTS ANGIOGRAPHY;  Surgeon: Jordan, Peter M, MD;  Location: [facility] INVASIVE CV LAB;  Service: Cardiovascular;  Laterality: N/A;   LOWER EXTREMITY ANGIOGRAM Right 12/25/2019   Procedure: RIGHT LOWER EXTREMITY  ANGIOGRAM;  Surgeon: Eliza Lonni RAMAN, MD;  Location: [facility] OR;  Service: Vascular;  Laterality: Right;   LOWER EXTREMITY INTERVENTION Right 02/01/2024   Procedure: LOWER EXTREMITY INTERVENTION;  Surgeon: Lanis Fonda BRAVO, MD;  Location: [facility] INVASIVE CV LAB;  Service: Cardiovascular;  Laterality: Right;   PERIPHERAL VASCULAR BALLOON ANGIOPLASTY Left 12/07/2019   Procedure: PERIPHERAL VASCULAR BALLOON ANGIOPLASTY;  Surgeon: Eliza Lonni RAMAN, MD;  Location: [facility] INVASIVE CV LAB;  Service: Cardiovascular;  Laterality: Left;  SFA   PERIPHERAL VASCULAR THROMBECTOMY  01/31/2024   Procedure: PERIPHERAL VASCULAR THROMBECTOMY;  Surgeon: Serene Gaile ORN, MD;  Location: [facility] INVASIVE CV LAB;  Service: Cardiovascular;;   PERIPHERAL VASCULAR THROMBECTOMY N/A 02/01/2024   Procedure: PERIPHERAL VASCULAR THROMBECTOMY;  Surgeon: Lanis Fonda BRAVO, MD;  Location: [facility] INVASIVE CV LAB;  Service:  Cardiovascular;  Laterality: N/A;    Family History: Family History  Problem Relation Age of Onset   Heart disease Mother    Heart disease Father    Heart disease Sister     Social History  reports that he has been smoking cigars and cigarettes. He has been exposed to tobacco smoke. He has never used smokeless tobacco. He reports current drug use. Drugs: Marijuana and Cocaine. He reports that he does not drink alcohol.  Allergies[1]  Medications  Current Medications[2]  Vitals   Vitals:   03/26/24 0626  BP: (!) 136/90  Pulse: 77  Resp: 16  Temp: 98 F (36.7 C)  SpO2: 99%    There is no height or weight on file to calculate BMI.   Physical Exam   GENERAL: Awake, alert in NAD HEENT: - Normocephalic and atraumatic, dry mm, no LN++, no Thyromegally LUNGS - Clear to auscultation bilaterally with no wheezes CV - S1S2 RRR, no m/r/g, equal pulses bilaterally. ABDOMEN - Soft, nontender, nondistended with normoactive BS NEURO:  Mental Status: AA&Ox3 Speech and Language: speech clear.  Naming, repetition, fluency, and comprehension intact. Cranial Nerves: PERRL. EOMI, visual fields full, no facial asymmetry, facial sensation intact, hearing intact, tongue/uvula/soft palate midline, normal sternocleidomastoid and trapezius muscle strength. No evidence of tongue atrophy or fibrillations Motor: No drift but mild weakness on the right upper and lower extremity in comparison to the left. Tone: is normal and bulk is normal Sensation-diminished to light touch on the right upper extremity in comparison to the left.  At baseline has no sensation in the right leg.  Left leg sensation is normal Coordination: FTN intact bilaterally, no ataxia in BLE. Gait- deferred   Labs/Imaging/Neurodiagnostic studies   CBC:  Recent Labs  Lab 03-26-24 0643 2024/03/26 0648  WBC 4.5  --   NEUTROABS 2.3  --   HGB 12.7* 13.3  HCT 39.9 39.0  MCV 92.8  --   PLT 310  --    Basic Metabolic Panel:   Lab Results  Component Value Date   NA 142 2024-03-26   K 4.1 03-26-24   CO2 24 02/15/2024   GLUCOSE 82 2024-03-26   BUN 20 2024-03-26   CREATININE 1.30 (H) March 26, 2024   CALCIUM  10.7 (H) 02/15/2024   GFRNONAA >60 02/15/2024   GFRAA 99 11/13/2019   Lipid Panel:  Lab Results  Component Value Date   LDLCALC 193 (H) 10/13/2023   HgbA1c:  Lab Results  Component Value Date   HGBA1C 5.4 10/13/2023   Urine Drug Screen:     Component Value Date/Time   LABOPIA NONE DETECTED 01/30/2024 1620   COCAINSCRNUR POSITIVE (A) 01/30/2024 1620   LABBENZ NONE DETECTED  01/30/2024 1620   AMPHETMU NONE DETECTED 01/30/2024 1620   THCU POSITIVE (A) 01/30/2024 1620   LABBARB NONE DETECTED 01/30/2024 1620    INR  Lab Results  Component Value Date   INR 1.4 (H) 03/20/2024   APTT  Lab Results  Component Value Date   APTT 35 03/20/2024     CT Head without contrast(Personally reviewed): Posterior left MCA territory ischemia appears to have progressed since September but nonacute by CT.  No acute intracranial abnormality.  Aspects 10.  No bleed.  CT angio Head and Neck with contrast(Personally reviewed): Generalized left MCA hypoenhancement but no discrete emergent LVO.  There is progressed intracranial atherosclerosis since August.  Consideration for stimulant induced vasospasm as he has a history of cocaine abuse.  Other moderate and severe widespread intracranial atherosclerosis appear stable aside from possibly progressed severe atherosclerotic stenosis at the anterior genu of the right ICA siphon.  Comparatively mild extracranial atherosclerosis with no significant extracranial stenosis.  CT perfusion study was done after the last known well was deemed to be outside the 6-hour window but within 24 hours-negative CT perfusion aside from small and symmetric volume of oligemia in the inferior frontal gyri bilaterally felt likely to be artifactual.  CT angio chest abdomen pelvis dissection  protocol: Report reviewed.  Multiple stable findings.  No acute findings.  ASSESSMENT   Benjamin Espinoza is a 59 y.o. male with prior history as documented above presenting for sudden onset of chest discomfort and right arm tingling and numbness.  CT head unremarkable for acute process-redemonstrated left posterior MCA territory infarct that has progressed in size but looks nonacute by CT.  CT angiography shows severe intracranial atherosclerosis disease and left MCA branches oligemia without a clear area of occlusion and stable to progressed intracranial atherosclerotic disease.  Reports having quit cocaine although toxicology screen was positive a month ago. It would not be surprising to find new left hemispheric infarcts but symptoms could also be related to recrudescence of old symptoms in the setting of either drug use or a primary cardiac etiology.  Impression: Evaluate for stroke  RECOMMENDATIONS  MRI brain stat-if positive for stroke, admit for stroke workup.  That should include telemetry, echocardiogram, frequent neurochecks, A1c, lipid panel and therapy assessments. He is currently on Xarelto -if the MRI is negative or the stroke size is small, okay to continue Xarelto . If the MRI is negative for stroke, evaluate for other reasons that might be causing recrudescence of old symptoms  Plan was discussed with Dr. Patsey ______________________________________________________________________    Signed, Eligio Lav, MD [facility]    Addendum MRI of the brain is completed.  It demonstrates mostly subacute and chronic area of cortex and white matter ischemia in the left parietal lobe but possible small volume diffusion restriction there.  No malignant hemorrhagic transformation or mass effect.  No other acute intracranial abnormality.  Chronic small vessel disease and chronic likely posttraumatic left temporal lobe encephalomalacia otherwise stable. I would recommend  admission for stroke workup as above. Okay to continue Xarelto . Stroke team to follow  -- Eligio Lav, MD Neurologist [facility]      [1] No Known Allergies [2] No current facility-administered medications for this encounter.  Current Outpatient Medications:    acetaminophen  (TYLENOL ) 500 MG tablet, Take 1 tablet (500 mg total) by mouth every 6 (six) hours as needed for mild pain (pain score 1-3), headache or fever., Disp: , Rfl:    aspirin  EC 81 MG tablet, Take 1 tablet (81  mg total) by mouth daily. Swallow whole., Disp: 30 tablet, Rfl: 12   atorvastatin  (LIPITOR ) 80 MG tablet, Take 1 tablet (80 mg total) by mouth daily., Disp: 90 tablet, Rfl: 0   carvedilol  (COREG ) 3.125 MG tablet, Take 1 tablet (3.125 mg total) by mouth 2 (two) times daily with a meal., Disp: 60 tablet, Rfl: 2   [Paused] cilostazol  (PLETAL ) 100 MG tablet, Take 1 tablet (100 mg total) by mouth 2 (two) times daily. (Patient not taking: Reported on 12/12/2023), Disp: 60 tablet, Rfl: 0   gabapentin  (NEURONTIN ) 300 MG capsule, Take 1 capsule (300 mg total) by mouth 3 (three) times daily as needed (neuropathy, pain)., Disp: 90 capsule, Rfl: 1   isosorbide  mononitrate (IMDUR ) 30 MG 24 hr tablet, Take 3 tablets (90 mg total) by mouth daily., Disp: 30 tablet, Rfl: 2   lidocaine  (LIDODERM ) 5 %, Place 1 patch onto the skin at bedtime. Remove & Discard patch within 12 hours or as directed by MD, Disp: 30 patch, Rfl: 0   nicotine  (NICODERM CQ  - DOSED IN MG/24 HOURS) 14 mg/24hr patch, Place 1 patch (14 mg total) onto the skin daily., Disp: 28 patch, Rfl: 0   nicotine  polacrilex (NICORETTE ) 4 MG gum, Take 1 each (4 mg total) by mouth as needed for smoking cessation. (Patient not taking: Reported on 01/31/2024), Disp: 100 tablet, Rfl: 2   nitroGLYCERIN  (NITROSTAT ) 0.4 MG SL tablet, Place 1 tablet (0.4 mg total) under the tongue every 5 (five) minutes as needed for chest pain. (Patient not taking: Reported on 01/31/2024),  Disp: 25 tablet, Rfl: 3   nitroGLYCERIN  (NITROSTAT ) 0.4 MG SL tablet, Dissolve 1 tablet (0.4 mg total) under the tongue every 5 (five) minutes as needed for chest pain. Do not exceed a total of 3 doses in 15 minutes., Disp: 25 tablet, Rfl: 3   oxyCODONE -acetaminophen  (PERCOCET/ROXICET) 5-325 MG tablet, Take 1-2 tablets by mouth every 6 (six) hours as needed for severe pain (pain score 7-10)., Disp: 20 tablet, Rfl: 0   rivaroxaban  (XARELTO ) 20 MG TABS tablet, Take 1 tablet (20 mg total) by mouth daily., Disp: 30 tablet, Rfl: 11   traMADol  (ULTRAM ) 50 MG tablet, Take 1 tablet (50 mg total) by mouth every 6 (six) hours as needed., Disp: 20 tablet, Rfl: 0

## 2024-03-21 ENCOUNTER — Observation Stay (HOSPITAL_COMMUNITY)

## 2024-03-21 DIAGNOSIS — Z7982 Long term (current) use of aspirin: Secondary | ICD-10-CM | POA: Diagnosis not present

## 2024-03-21 DIAGNOSIS — I10 Essential (primary) hypertension: Secondary | ICD-10-CM | POA: Diagnosis not present

## 2024-03-21 DIAGNOSIS — N189 Chronic kidney disease, unspecified: Secondary | ICD-10-CM | POA: Diagnosis not present

## 2024-03-21 DIAGNOSIS — E785 Hyperlipidemia, unspecified: Secondary | ICD-10-CM | POA: Diagnosis not present

## 2024-03-21 DIAGNOSIS — R202 Paresthesia of skin: Secondary | ICD-10-CM

## 2024-03-21 DIAGNOSIS — I7090 Unspecified atherosclerosis: Secondary | ICD-10-CM | POA: Diagnosis not present

## 2024-03-21 DIAGNOSIS — R200 Anesthesia of skin: Secondary | ICD-10-CM

## 2024-03-21 DIAGNOSIS — R0789 Other chest pain: Secondary | ICD-10-CM

## 2024-03-21 DIAGNOSIS — I639 Cerebral infarction, unspecified: Secondary | ICD-10-CM | POA: Diagnosis not present

## 2024-03-21 DIAGNOSIS — I5020 Unspecified systolic (congestive) heart failure: Secondary | ICD-10-CM | POA: Diagnosis not present

## 2024-03-21 DIAGNOSIS — I130 Hypertensive heart and chronic kidney disease with heart failure and stage 1 through stage 4 chronic kidney disease, or unspecified chronic kidney disease: Secondary | ICD-10-CM | POA: Diagnosis not present

## 2024-03-21 DIAGNOSIS — Z7901 Long term (current) use of anticoagulants: Secondary | ICD-10-CM | POA: Diagnosis not present

## 2024-03-21 DIAGNOSIS — F17210 Nicotine dependence, cigarettes, uncomplicated: Secondary | ICD-10-CM | POA: Diagnosis not present

## 2024-03-21 DIAGNOSIS — Z951 Presence of aortocoronary bypass graft: Secondary | ICD-10-CM | POA: Diagnosis not present

## 2024-03-21 LAB — LIPID PANEL
Cholesterol: 227 mg/dL — ABNORMAL HIGH (ref 0–200)
HDL: 49 mg/dL
LDL Cholesterol: 167 mg/dL — ABNORMAL HIGH (ref 0–99)
Total CHOL/HDL Ratio: 4.7 ratio
Triglycerides: 55 mg/dL
VLDL: 11 mg/dL (ref 0–40)

## 2024-03-21 LAB — CBC
HCT: 38.8 % — ABNORMAL LOW (ref 39.0–52.0)
Hemoglobin: 12.4 g/dL — ABNORMAL LOW (ref 13.0–17.0)
MCH: 29.8 pg (ref 26.0–34.0)
MCHC: 32 g/dL (ref 30.0–36.0)
MCV: 93.3 fL (ref 80.0–100.0)
Platelets: 292 K/uL (ref 150–400)
RBC: 4.16 MIL/uL — ABNORMAL LOW (ref 4.22–5.81)
RDW: 16.2 % — ABNORMAL HIGH (ref 11.5–15.5)
WBC: 4.6 K/uL (ref 4.0–10.5)
nRBC: 0 % (ref 0.0–0.2)

## 2024-03-21 LAB — HEMOGLOBIN A1C
Hgb A1c MFr Bld: 5.6 % (ref 4.8–5.6)
Mean Plasma Glucose: 114.02 mg/dL

## 2024-03-21 LAB — BASIC METABOLIC PANEL WITH GFR
Anion gap: 8 (ref 5–15)
BUN: 21 mg/dL — ABNORMAL HIGH (ref 6–20)
CO2: 25 mmol/L (ref 22–32)
Calcium: 9.5 mg/dL (ref 8.9–10.3)
Chloride: 102 mmol/L (ref 98–111)
Creatinine, Ser: 1.18 mg/dL (ref 0.61–1.24)
GFR, Estimated: >60 mL/min
Glucose, Bld: 87 mg/dL (ref 70–99)
Potassium: 4.1 mmol/L (ref 3.5–5.1)
Sodium: 135 mmol/L (ref 135–145)

## 2024-03-21 MED ORDER — ISOSORBIDE MONONITRATE ER 60 MG PO TB24
60.0000 mg | ORAL_TABLET | Freq: Every day | ORAL | Status: DC
  Administered 2024-03-21 – 2024-03-22 (×2): 60 mg via ORAL
  Filled 2024-03-21 (×2): qty 1

## 2024-03-21 MED ORDER — NICOTINE 21 MG/24HR TD PT24
21.0000 mg | MEDICATED_PATCH | Freq: Every day | TRANSDERMAL | Status: DC
  Administered 2024-03-21: 21 mg via TRANSDERMAL
  Filled 2024-03-21 (×2): qty 1

## 2024-03-21 MED ORDER — RANOLAZINE ER 500 MG PO TB12
1000.0000 mg | ORAL_TABLET | Freq: Two times a day (BID) | ORAL | Status: DC
  Administered 2024-03-21 – 2024-03-22 (×2): 1000 mg via ORAL
  Filled 2024-03-21 (×3): qty 2

## 2024-03-21 NOTE — Progress Notes (Signed)
 "  HD#0 SUBJECTIVE:  Patient Summary: Benjamin Espinoza is a 59 y.o. 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   Overnight Events:  None  Interim History:  Patient resting comfortably in bed. Not reporting any current chest pain though has had right arm pain especially with movement of his arm. He has noticed since day prior that his right eye's lateral peripheral vision is blurrier than his left which has been present since yesterday but not unchanged today. Congratulated him on trying to get his life turned around and stop using cocaine. He feels ready for discharge back to [facility] facility when medically cleared. He does sleep in a top bunk at the facility and OT recommended bottom bunk.   OBJECTIVE:  Vital Signs: Vitals:   03/21/24 0406 03/21/24 0452 03/21/24 0545 03/21/24 0600  BP:   (!) 139/96 (!) 146/101  Pulse:   65 69  Resp:   16 16  Temp: 98.6 F (37 C) 98.5 F (36.9 C)    TempSrc:      SpO2:   100% 100%   Supplemental O2: Room Air SpO2: 100 %  There were no vitals filed for this visit.   Intake/Output Summary (Last 24 hours) at 03/21/2024 0729 Last data filed at 03/20/2024 1822 Gross per 24 hour  Intake 720 ml  Output 430 ml  Net 290 ml   Net IO Since Admission: 290 mL [03/21/24 0729]  Physical Exam:  Gen: A&Ox4, NAD HEENT: Atraumatic, normocephalic; oropharynx clear, tongue without atrophy or fasciculations. Resp: CTAB, normal work of breathing CV: RRR, extremities appear well-perfused. Abd: soft/NT/ND Extrem: Nml bulk; no cyanosis, clubbing, or edema. Right arm pain with AROM and with good strength  Neuro: *MS: A&O x4. Follows multi-step commands.  *Speech: no dysarthria  or aphasia, able to name and repeat. *CN:    I: Deferred   II,III: PERRLA, VFF by confrontation, optic discs not visualized 2/2 pupillary constriction; visual field testing with slightly diminished right eye's lateral peripheral vision   III,IV,VI: EOMI w/o nystagmus, no ptosis   V: Sensation diminished from V1 to V3 to LT on right side    VII: Eyelid closure was full.  Smile symmetric.   VIII: Hearing intact to voice   IX,X: Voice normal, palate elevates symmetrically    XI: SCM/trap 5/5 bilat   XII: Tongue protrudes midline, no atrophy or fasciculations  *Motor:   Normal bulk.  No tremor, rigidity or bradykinesia. No pronator drift.   Strength: Dlt Bic Tri WE WrF FgS Gr HF KnF KnE PlF DoF    Left 5 5 5 5 5 5 5 5 5 5 5 5     Right 5 5 5 5 5 5 5 5 5 5 5 5    *Sensory: Intact to light touch, pinprick, temperature vibration throughout. Asymmetric, right diminished compared to left similar to baseline. *Gait: deferred  Skin: warm and dry Psych: mood calm, behavior normal, thought content normal, judgement normal    Patient Lines/Drains/Airways Status     Active Line/Drains/Airways     Name Placement date Placement time Site Days   Peripheral IV 03/20/24 18 G Anterior;Proximal;Right Forearm 03/20/24  0650  Forearm  1  Pertinent labs and imaging:      Latest Ref Rng & Units 03/21/2024    1:49 AM 03/20/2024    6:48 AM 03/20/2024    6:43 AM  CBC  WBC 4.0 - 10.5 K/uL 4.6   4.5   Hemoglobin 13.0 - 17.0 g/dL 87.5  86.6  87.2   Hematocrit 39.0 - 52.0 % 38.8  39.0  39.9   Platelets 150 - 400 K/uL 292   310        Latest Ref Rng & Units 03/21/2024    1:49 AM 03/20/2024    6:48 AM 03/20/2024    6:43 AM  CMP  Glucose 70 - 99 mg/dL 87  82  84   BUN 6 - 20 mg/dL 21  20  19    Creatinine 0.61 - 1.24 mg/dL 8.81  8.69  8.79   Sodium 135 - 145 mmol/L 135  142  138   Potassium 3.5 - 5.1 mmol/L 4.1  4.1  4.1   Chloride 98 - 111 mmol/L 102  106  106   CO2 22 - 32 mmol/L 25    24   Calcium  8.9 - 10.3 mg/dL 9.5   9.8   Total Protein 6.5 - 8.1 g/dL   7.2   Total Bilirubin 0.0 - 1.2 mg/dL   0.3   Alkaline Phos 38 - 126 U/L   72   AST 15 - 41 U/L   20   ALT 0 - 44 U/L   13     ECHOCARDIOGRAM COMPLETE Result Date: 03/20/2024    ECHOCARDIOGRAM REPORT   Patient Name:   WYLAND RASTETTER Date of Exam: 03/20/2024 Medical Rec #:  [id number]          Height:       73.0 in Accession #:    [id number]         Weight:       197.0 lb Date of Birth:  08/19/1965          BSA:          2.138 m Patient Age:    58 years           BP:           149/97 mmHg Patient Gender: M                  HR:           71 bpm. Exam Location:  Inpatient Procedure: 2D Echo, Cardiac Doppler and Color Doppler (Both Spectral and Color            Flow Doppler were utilized during procedure). Indications:    Stroke I63.9  History:        Patient has prior history of Echocardiogram examinations, most                 recent 10/15/2023. Cardiomyopathy, Previous Myocardial Infarction                 and CAD; Signs/Symptoms:Chest Pain.  Sonographer:    Sydnee Wilson RDCS Referring Phys: [id number] NATHAN PICKERING IMPRESSIONS  1. Left ventricular ejection fraction, by estimation, is 50 to 55%. The left ventricle has low normal function. The left ventricle demonstrates regional wall motion abnormalities (see scoring diagram/findings for description). There is mild concentric left ventricular hypertrophy. Left ventricular diastolic parameters are consistent with Grade I diastolic dysfunction (impaired relaxation). There is mild hypokinesis of the left ventricular, basal inferoseptal wall and inferior wall. There is  mild hypokinesis of the left ventricular, apical inferior wall, septal wall and apical segment.  2. Right ventricular systolic function is normal. The right ventricular size is normal. Tricuspid regurgitation signal is inadequate for assessing PA pressure.  3. The mitral valve is normal in structure. Trivial mitral valve  regurgitation. No evidence of mitral stenosis.  4. The aortic valve is tricuspid. Aortic valve regurgitation is mild to moderate. No aortic stenosis is present.  5. Aortic dilatation noted. There is mild dilatation of the aortic root, measuring 41 mm.  6. The inferior vena cava is normal in size with greater than 50% respiratory variability, suggesting right atrial pressure of 3 mmHg. Comparison(s): Prior images reviewed side by side. The left ventricular inferior wall abnormality is old. The apical wall motion abnormality appears to be new. FINDINGS  Left Ventricle: Left ventricular ejection fraction, by estimation, is 50 to 55%. The left ventricle has low normal function. The left ventricle demonstrates regional wall motion abnormalities. Mild hypokinesis of the left ventricular, basal inferoseptal  wall and inferior wall. Mild hypokinesis of the left ventricular, apical inferior wall, septal wall and apical segment. The left ventricular internal cavity size was normal in size. There is mild concentric left ventricular hypertrophy. Left ventricular  diastolic parameters are consistent with Grade I diastolic dysfunction (impaired relaxation). Normal left ventricular filling pressure.  LV Wall Scoring: The apical septal segment, basal inferior segment, apical inferior segment, basal inferoseptal segment, and apex are hypokinetic. The entire anterior wall, entire lateral wall, anterior septum, mid inferoseptal segment, and mid inferior segment are normal. Right Ventricle: The right ventricular size is normal. No increase in right ventricular wall thickness. Right ventricular systolic function is normal. Tricuspid regurgitation signal is inadequate for assessing PA pressure. Left Atrium: Left atrial size was normal in size. Right Atrium: Right atrial size was normal in size. Pericardium: There is no evidence of pericardial effusion. Mitral Valve: The mitral valve is normal in structure. Trivial mitral valve  regurgitation. No evidence of mitral valve stenosis. Tricuspid Valve: The tricuspid valve is normal in structure. Tricuspid valve regurgitation is trivial. Aortic Valve: The aortic valve is tricuspid. Aortic valve regurgitation is mild to moderate. Aortic regurgitation PHT measures 137 msec. No aortic stenosis is present. Aortic valve mean gradient measures 5.0 mmHg. Aortic valve peak gradient measures 8.1 mmHg. Aortic valve area, by VTI measures 4.02 cm. Pulmonic Valve: The pulmonic valve was normal in structure. Pulmonic valve regurgitation is trivial. No evidence of pulmonic stenosis. Aorta: Aortic dilatation noted. There is mild dilatation of the aortic root, measuring 41 mm. Venous: The inferior vena cava is normal in size with greater than 50% respiratory variability, suggesting right atrial pressure of 3 mmHg. IAS/Shunts: No atrial level shunt detected by color flow Doppler.  LEFT VENTRICLE PLAX 2D LVIDd:         4.90 cm      Diastology LVIDs:         3.90 cm      LV e' medial:    6.42 cm/s LV PW:         1.20 cm      LV E/e' medial:  10.6 LV IVS:        1.30 cm      LV e' lateral:   10.10 cm/s LVOT diam:     2.10 cm      LV E/e' lateral: 6.7 LV SV:         100 LV SV Index:   47 LVOT Area:  3.46 cm  LV Volumes (MOD) LV vol d, MOD A2C: 135.0 ml LV vol d, MOD A4C: 167.0 ml LV vol s, MOD A2C: 62.7 ml LV vol s, MOD A4C: 80.9 ml LV SV MOD A2C:     72.3 ml LV SV MOD A4C:     167.0 ml LV SV MOD BP:      80.9 ml RIGHT VENTRICLE RV S prime:     11.00 cm/s TAPSE (M-mode): 1.8 cm LEFT ATRIUM             Index        RIGHT ATRIUM           Index LA diam:        3.40 cm 1.59 cm/m   RA Area:     13.50 cm LA Vol (A2C):   39.8 ml 18.62 ml/m  RA Volume:   27.30 ml  12.77 ml/m LA Vol (A4C):   40.4 ml 18.90 ml/m LA Biplane Vol: 40.7 ml 19.04 ml/m  AORTIC VALVE AV Area (Vmax):    3.49 cm AV Area (Vmean):   3.43 cm AV Area (VTI):     4.02 cm AV Vmax:           142.00 cm/s AV Vmean:          103.000 cm/s AV VTI:             0.249 m AV Peak Grad:      8.1 mmHg AV Mean Grad:      5.0 mmHg LVOT Vmax:         143.00 cm/s LVOT Vmean:        102.000 cm/s LVOT VTI:          0.289 m LVOT/AV VTI ratio: 1.16 AI PHT:            137 msec  AORTA Ao Root diam: 4.10 cm Ao Asc diam:  3.90 cm MITRAL VALVE MV Area (PHT): 3.07 cm    SHUNTS MV Decel Time: 247 msec    Systemic VTI:  0.29 m MV E velocity: 68.10 cm/s  Systemic Diam: 2.10 cm MV A velocity: 79.70 cm/s MV E/A ratio:  0.85 Mihai Croitoru MD Electronically signed by Jerel Balding MD Signature Date/Time: 03/20/2024/4:00:50 PM    Final    MR BRAIN WO CONTRAST Result Date: 03/20/2024 EXAM: MRI BRAIN WITHOUT CONTRAST 03/20/2024 09:14:54 AM TECHNIQUE: Multiplanar multisequence MRI of the head/brain was performed without the administration of intravenous contrast. COMPARISON: CT head, CTA, CT perfusion earlier today. Brain MRI 10/13/2023. CLINICAL HISTORY: 59 year old male with acute neuro deficit, stroke suspected, and code stroke presentation this morning. FINDINGS: BRAIN AND VENTRICLES: Diffusion imaging demonstrates normal diffusion posteriorly in the left MCA territory, left parietal lobe cortex and subcortical white matter, with the area mostly facilitated on ADC. A small rim of restricted diffusion is possible (series 2, image 37). Developing encephalomalacia is present in this region. Mild hemosiderin is noted. There is no regional mass effect. No other convincing diffusion restriction is identified. Widely scattered cerebral white matter T2 and FLAIR hyperintensity, chronic left lateral temporal lobe encephalomalacia, and comparatively mild T2 heterogeneity in the pons are stable. Deep gray nuclei also relatively spared. Chronic cerebral blood products including in the left occipital lobe, cerebellar vermis, appear stable. No mass. No midline shift. No hydrocephalus. The sella is unremarkable. Major vascular flow voids are stable. ORBITS: No significant abnormality. SINUSES AND  MASTOIDS: Paranasal sinus mucosal thickening and opacification again noted. Visible mastoids and internal  auditory structures appear stable and negative. BONES AND SOFT TISSUES: Normal marrow signal. No soft tissue abnormality. IMPRESSION: 1. Mostly subacute and chronic area of cortex and white matter ischemia in the left parietal lobe, but possible small volume diffusion restriction there. No malignant hemorrhagic transformation or mass effect. 2. No other acute intracranial abnormality. Chronic small vessel disease, possibly posttraumatic left temporal lobe encephalomalacia otherwise stable. Electronically signed by: Helayne Hurst MD 03/20/2024 09:29 AM EST RP Workstation: [id number]    ASSESSMENT/PLAN:  Assessment: Principal Problem:   Stroke [facility]) Active Problems:   Essential hypertension   Hyperlipidemia   PAD (peripheral artery disease)   Heart failure with mid-range ejection fraction (HFmEF) (HCC)   Chronic anticoagulation   History of CVA (cerebrovascular accident)   Coronary artery disease involving coronary bypass graft of native heart with angina pectoris  Benjamin Espinoza is a 59 y.o. 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   Plan: #Subacute left parietal ischemic stroke #Prior CVA Symptoms stable and predominantly sensory in the right upper extremity and right face.  Mild motor weakness in the right lower extremity seem to be chronic and related to his vascular disease.  Adherent on medications including Xarelto  and aspirin  but for an unknown reason was not taking atorvastatin .  LDL of 167, not at goal.  Neurology following, appreciate assistance. A1c of 5.6. Echo shows unchanged LVEF 50-55%, but with  new wall motion abnormalities. Given new wall motion abnormalities and ongoing intermittent chest pain, will consult Cards as below.  - Neurology following, appreciate assistance  - Continue home Xarelto  20 mg daily  - Continue home aspirin  81 mg daily - Resume home atorvastatin  80 mg daily - PT/OT/SLP recs: None - Tylenol  1000 mg every 8 hours as needed, topical lidocaine  patch, topical Voltaren  gel for shoulder pain - f/u right shoulder X-ray   #Chest pain #CAD s/p CABG #HFmrEF Patient has had intermittent chest pain during hospitalization. Previous LHC 05/2023 w/ severe 3 vessel occlusive CAD, patent LIMA to LAD (though with distal occlusion), patent SVG to OM3, patent SVG to OM1, 80% stenosis in proximal SVG, known occlusion of SVG to RCA. At this point he was deemed a poor candidate for PCI given his PSUD and noncompliance with medical therapy and low likelihood of long term stent patency. However, repeat Echo shows wall motion abnormalities. He is however recovering from substance use and living in rehab. Consulted Cardiology for ongoing intermittent chest pain and new findings on Echo.  - Continue home aspirin  81 mg daily - Resume home atorvastatin  80 mg daily - Cardiology consulted, appreciate assistance   #Hypertension Amlodipine  and losartan  were held on discharge from the hospital in 01/2024 apparently due to normal to low blood pressures so if he remains hypertensive we will add these back after his isosorbide  with losartan  being first after that. - Resume home antihypertensives if BP requires    #PAD Significant vascular disease in his lower extremities with admission in December 2025 for critical limb ischemia where he underwent right lower extremity thrombectomy and right lower extremity bypass lysis and relining.  No new or worsening symptoms associated with his lower extremity but he does have baseline decreased sensation and mild weakness in his right lower  extremity.   Extremities are warm and well-perfused with slightly diminished but palpable dorsalis pedis pulses bilaterally. - Continue home Xarelto  20 mg daily  - Continue home aspirin  81 mg daily - Resume home atorvastatin  80 mg daily  #Substance Use (cocaine, tobacco) Patient has been long-timer user of cocaine, however last cocaine use was 1 week ago and he then entered drug rehab program at [facility]. UDS on admission negative. Commended patient for efforts of sobriety. Recommend continued smoking cessation counseling outpatient, especially given his vascular disease.  - Nicotine  patches - Continue smoking cessation outpatient  - Return to [facility] for drug rehab upon discharge   Best Practice: Diet: Cardiac diet VTE: rivaroxaban  (XARELTO ) tablet 20 mg Start: 03/20/24 1700 Code: Full  Disposition planning: Therapy Recs: None, DME: none DISPO: Anticipated discharge today or tomorrow to [facility] pending Cardiology recs.  Signature:  Doyal Miyamoto, MD [facility]  7:29 AM, 03/21/2024  On Call pager [phone]  "

## 2024-03-21 NOTE — ED Notes (Signed)
 Called and updated floor about pt, and need to see cards before d/c and his facility being unavailable after 6pm

## 2024-03-21 NOTE — Discharge Planning (Signed)
 Transition of Care [facility]) - Inpatient Brief Assessment   Patient Details  Name: Benjamin Espinoza MRN: [id number] Date of Birth: 1966/02/22  [facility] CM/SW Contact:    Debarah Saunas, RN Phone Number: 03/21/2024, 9:30 AM   Clinical Narrative: Inpatient Care Management (ICM) has reviewed patient at bedside and no ICM needs have been identified at this time. We will continue to monitor patient advancement through interdisciplinary progression rounds. If new patient transition needs arise, please place a ICM consult.    Transition of Care Asessment: Insurance and Status: Insurance coverage has been reviewed Patient has primary care physician: Yes ([name], [name], DO) Home environment has been reviewed: from [facility] Prior level of function:: moderate independent Prior/Current Home Services: Current home services (personal care services 80hours/week) Social Drivers of Health Review: SDOH reviewed no interventions necessary (pt living at [facility] for rehab) Readmission risk has been reviewed: No Transition of care needs: no transition of care needs at this time

## 2024-03-21 NOTE — ED Notes (Signed)
 Per chaplain pt was having some more chest pain and took another nitroglycerin , talked to pt and reiterated to not take home meds. MD notified

## 2024-03-21 NOTE — Evaluation (Signed)
 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 12/06/2023   Symptomatic anemia 11/18/2023   Chronic anticoagulation 11/18/2023   History of CVA (cerebrovascular accident) 11/18/2023   Gastrointestinal hemorrhage 11/18/2023   Positive fecal occult blood test 11/18/2023   Acute CVA (cerebrovascular accident) (HCC) 10/13/2023   Chronic kidney disease, stage 3a (HCC) 10/13/2023   Aortic dilatation 10/13/2023   Lumbar radiculopathy 07/05/2023   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/17/2023   Heart failure with mid-range ejection fraction (HFmEF) (HCC) 06/17/2023   Cocaine abuse (HCC) 06/16/2023   Unhoused person 06/16/2023   Chest pain  Unstable angina 06/15/2023   Chronic health problem 06/15/2023   Unstable angina (HCC) 06/15/2023   Encounter for well adult exam with abnormal findings 04/08/2023   Chronic pain syndrome 03/29/2023   Acute left ankle pain 03/23/2023   Impaired mobility and ADLs 03/23/2023   Right leg pain 03/23/2023   Chronic bilateral low back pain with left-sided sciatica 01/11/2023   OAB (overactive bladder) 01/11/2023   Encounter for medication monitoring 01/10/2023   PAD (peripheral artery disease) 09/21/2022   Ischemic cardiomyopathy 12/17/2020   NSTEMI (non-ST elevated myocardial infarction) (HCC) 11/28/2020   Anxiety    Asthma    Sinus bradycardia 11/29/2019   Abnormal stress test    DDD (degenerative disc disease), lumbar 11/08/2019   Essential hypertension    Hx of CABG    Hyperlipidemia    Myocardial infarction (HCC)    Tobacco use disorder    PVD (peripheral vascular disease)  09/21/2019   Past Medical History:  Past Medical History:  Diagnosis Date   Abnormal stress test    Acute left ankle pain 03/23/2023   Anxiety    Asthma    as a child   Atypical chest pain 05/07/2017   Chest pain  Unstable angina 06/15/2023   Chronic bilateral low back pain with left-sided sciatica 01/11/2023   Chronic health problem 06/15/2023   Chronic pain syndrome 03/29/2023   Cocaine abuse (HCC) 06/16/2023   Coronary artery disease    DDD (degenerative disc disease), lumbar 11/08/2019   Formatting of this note might be different from the original. 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 There is mild dilatation of the ascending aorta, measuring 43 mm. There is mild dilatation of the aortic root, measuring 44 mm.      Encounter for medication monitoring 01/10/2023   Encounter for well adult exam with abnormal findings 04/08/2023   Essential hypertension    Heart failure with mid-range ejection fraction (HFmEF) (HCC) 06/17/2023   06/16/23 transthoracic echocardiogram: Left Ventricle: Left ventricular ejection fraction 45 to 50%. The left ventricle has mildly decreased function. No left ventricular hypertrophy. 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 [place]    Hyperlipidemia    Impaired mobility and ADLs 03/23/2023   Ischemic cardiomyopathy 12/17/2020   Lumbar radiculopathy 07/05/2023   Myocardial infarction [facility]) 2017   NSTEMI (non-ST elevated myocardial infarction) (HCC) 11/28/2020   OAB (overactive bladder) 01/11/2023   PAD (peripheral artery disease) 09/21/2022   PVD (peripheral vascular disease)  09/21/2019   Right leg pain 03/23/2023   Shortness of breath    Sinus bradycardia 11/29/2019   Tobacco use disorder    Unhoused person 06/16/2023   Unstable angina (HCC) 06/15/2023   Past Surgical History:  Past Surgical History:  Procedure Laterality Date    ABDOMINAL AORTOGRAM W/LOWER EXTREMITY Bilateral 09/21/2019   Procedure: ABDOMINAL AORTOGRAM W/LOWER EXTREMITY;  Surgeon: Eliza Lonni RAMAN, MD;  Location: [facility] INVASIVE CV LAB;  Service: Cardiovascular;  Laterality: Bilateral;   ABDOMINAL AORTOGRAM W/LOWER EXTREMITY N/A 12/07/2019   Procedure: ABDOMINAL AORTOGRAM W/LOWER EXTREMITY;  Surgeon: Eliza Lonni RAMAN, MD;  Location: [facility] INVASIVE CV LAB;  Service: Cardiovascular;  Laterality: N/A;   ABDOMINAL AORTOGRAM W/LOWER EXTREMITY N/A 09/09/2022   Procedure: ABDOMINAL AORTOGRAM W/LOWER EXTREMITY;  Surgeon: Gretta Lonni PARAS, MD;  Location: [facility] INVASIVE CV LAB;  Service: Vascular;  Laterality: N/A;   ABDOMINAL AORTOGRAM W/LOWER EXTREMITY N/A 01/31/2024   Procedure: ABDOMINAL AORTOGRAM W/LOWER EXTREMITY;  Surgeon: Serene Gaile ORN, MD;  Location: [facility] INVASIVE CV LAB;  Service: Cardiovascular;  Laterality: N/A;   CARDIAC CATHETERIZATION     COLONOSCOPY N/A 11/19/2023   Procedure: COLONOSCOPY;  Surgeon: Stacia Glendia BRAVO, MD;  Location: [facility] ENDOSCOPY;  Service: Gastroenterology;  Laterality: N/A;   CORONARY ARTERY BYPASS GRAFT     quadruple bypas 2017   CORONARY STENT INTERVENTION N/A 11/26/2019   Procedure: CORONARY STENT INTERVENTION;  Surgeon: Mady Lonni, MD;  Location: [facility] INVASIVE CV LAB;  Service: Cardiovascular;  Laterality: N/A;   ESOPHAGOGASTRODUODENOSCOPY N/A 11/19/2023   Procedure: EGD (ESOPHAGOGASTRODUODENOSCOPY);  Surgeon: Stacia Glendia BRAVO, MD;  Location: [facility] ENDOSCOPY;  Service: Gastroenterology;  Laterality: N/A;   FEMORAL-POPLITEAL BYPASS GRAFT Right 12/25/2019   Procedure: BYPASS GRAFT FEMORAL- BELOW KNEE POPLITEAL ARTERY RIGHT USING GORE PROPATEN VASCULAR GRAFT;  Surgeon: Eliza Lonni RAMAN, MD;  Location: [facility] OR;  Service: Vascular;  Laterality: Right;   FEMORAL-POPLITEAL BYPASS GRAFT Right 09/21/2022   Procedure: REDO RIGHT LOWER EXTREMITY BYPASS USING GORE PROPATEN 6mm REMOVALBLE RING GRAFT;  Surgeon: Eliza Lonni RAMAN, MD;  Location: [facility] OR;  Service: Vascular;  Laterality: Right;   INTRAOPERATIVE ARTERIOGRAM Right 09/21/2022   Procedure: INTRA OPERATIVE ARTERIOGRAM;  Surgeon: Eliza Lonni RAMAN, MD;  Location: [facility] OR;  Service: Vascular;  Laterality: Right;   LEFT HEART CATH AND CORS/GRAFTS ANGIOGRAPHY N/A 11/26/2019   Procedure: LEFT HEART CATH AND CORS/GRAFTS ANGIOGRAPHY;  Surgeon: Mady Lonni, MD;  Location: [facility] INVASIVE CV LAB;  Service: Cardiovascular;  Laterality: N/A;   LEFT HEART CATH AND CORS/GRAFTS ANGIOGRAPHY N/A 12/01/2020   Procedure: LEFT HEART CATH AND CORS/GRAFTS ANGIOGRAPHY;  Surgeon: Burnard Debby LABOR, MD;  Location: [facility] INVASIVE CV LAB;  Service: Cardiovascular;  Laterality: N/A;   LEFT HEART CATH AND CORS/GRAFTS ANGIOGRAPHY N/A 06/16/2023   Procedure: LEFT HEART CATH AND CORS/GRAFTS ANGIOGRAPHY;  Surgeon: Jordan, Peter M, MD;  Location: [facility] INVASIVE CV LAB;  Service: Cardiovascular;  Laterality: N/A;   LOWER EXTREMITY ANGIOGRAM Right 12/25/2019   Procedure: RIGHT LOWER EXTREMITY ANGIOGRAM;  Surgeon: Eliza Lonni RAMAN, MD;  Location: [facility] OR;  Service: Vascular;  Laterality: Right;   LOWER EXTREMITY INTERVENTION Right 02/01/2024   Procedure: LOWER EXTREMITY INTERVENTION;  Surgeon: Lanis Fonda BRAVO, MD;  Location: [facility] INVASIVE CV LAB;  Service: Cardiovascular;  Laterality: Right;   PERIPHERAL VASCULAR BALLOON ANGIOPLASTY Left 12/07/2019   Procedure: PERIPHERAL VASCULAR BALLOON ANGIOPLASTY;  Surgeon: Eliza Lonni RAMAN, MD;  Location: [facility] INVASIVE CV LAB;  Service: Cardiovascular;  Laterality: Left;  SFA   PERIPHERAL VASCULAR THROMBECTOMY  01/31/2024   Procedure: PERIPHERAL VASCULAR THROMBECTOMY;  Surgeon: Serene Gaile ORN, MD;  Location: [facility] INVASIVE CV LAB;  Service: Cardiovascular;;   PERIPHERAL VASCULAR THROMBECTOMY N/A 02/01/2024   Procedure: PERIPHERAL VASCULAR THROMBECTOMY;  Surgeon: Lanis Fonda BRAVO, MD;  Location: [facility] INVASIVE CV LAB;  Service: Cardiovascular;  Laterality:  N/A;   HPI:  Carey Johndrow is a 59 y/o Male admitted from a drug rehab program 03/20/24 due to right shoulder pain/tingling, chest pressure (similar symptoms to his CVA in 8/25). MRI revealed Mostly subacute and chronic area of cortex and white matter ischemia in the left parietal lobe, but possible small volume diffusion restriction there. 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 Speech intelligible and without word finding deficits. Comprehension, attention, memory and problem solving intact. Patient focused on discharge in order to continue his rehab/recovery program. No further needs indentified.    SLP Assessment  SLP Recommendation/Assessment: Patient does not need any further Speech Language Pathology Services SLP Visit Diagnosis: Attention and concentration deficit Attention and concentration deficit following: Cerebral infarction        SLP Evaluation Cognition  Overall Cognitive Status: Within Functional Limits for tasks assessed       Comprehension  Auditory Comprehension Overall Auditory Comprehension: Appears within functional limits for tasks assessed Visual Recognition/Discrimination Discrimination: Within Function Limits Reading Comprehension Reading Status: Within funtional limits (larger print)    Expression Expression Primary Mode of Expression: Verbal Verbal Expression Overall Verbal Expression: Appears within functional limits for tasks assessed   Oral / Motor  Oral Motor/Sensory Function Overall Oral Motor/Sensory Function: Within functional limits Motor Speech Overall Motor Speech: Appears within functional limits for tasks assessed           Rea Pass MA,  CCC-SLP  Jaena Brocato Meryl 03/21/2024, 1:50 PM

## 2024-03-21 NOTE — Discharge Summary (Signed)
 "  Name: Benjamin Espinoza MRN: [id number] DOB: 1965-07-28 59 y.o. PCP: Benjamin Espinoza, [name], DO  Date of Admission: 03/20/2024  6:23 AM Date of Discharge: 03/22/2024 Attending Physician: Benjamin Espinoza  Discharge Diagnosis: 1. 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 Commonly known as: PLETAL  Take 1 tablet (100 mg total) by mouth 2 (two) times daily.       STOP taking these medications    oxyCODONE -acetaminophen  5-325 MG tablet Commonly known as: PERCOCET/ROXICET   traMADol  50 MG tablet Commonly known as: Ultram        TAKE these medications    acetaminophen  500 MG tablet Commonly known as: TYLENOL  Take 1 tablet (500 mg total) by mouth every 6 (six) hours as needed for mild pain (pain score 1-3), headache or fever.   aspirin  EC 81 MG tablet Take 1 tablet (81 mg total) by mouth daily. Swallow whole.   atorvastatin  80 MG tablet Commonly known as: LIPITOR  Take 1 tablet (80 mg total) by mouth daily.   carvedilol  3.125 MG tablet Commonly known as: COREG  Take 1 tablet (3.125 mg total) by mouth 2 (two) times daily with a meal.   cyclobenzaprine  5 MG tablet Commonly known as: FLEXERIL  Take 1 tablet (5 mg total) by mouth 3 (three) times daily as needed for muscle spasms.   diclofenac  Sodium 1 % Gel Commonly known as: VOLTAREN  Apply 4 g topically 4 (four) times daily. Apply to painful area shoulder/neck avoiding  any areas with skin breakdown   gabapentin  300 MG capsule Commonly known as: NEURONTIN  Take 1 capsule (300 mg total) by mouth 3 (three) times daily as needed (neuropathy, pain).   isosorbide  mononitrate 30 MG 24 hr tablet Commonly known as: IMDUR  Take 3 tablets (90 mg total) by mouth daily. Start taking on: March 23, 2024 What changed: how much to take   lidocaine  5 % Commonly known as: LIDODERM  Place 1 patch onto the skin at bedtime. Remove & Discard patch within 12 hours or as directed by Benjamin Espinoza   nicotine  14 mg/24hr patch Commonly known as: NICODERM CQ  - dosed in mg/24 hours Place 1 patch (14 mg total) onto the skin daily.   nicotine  polacrilex 4 MG gum Commonly known as: Nicorette  Take 1 each (4 mg total) by mouth as needed for smoking cessation.   nitroGLYCERIN  0.4 MG SL tablet Commonly known as: NITROSTAT  Place 1 tablet (0.4 mg total) under the tongue every 5 (five) minutes as needed for chest pain. What changed: Another medication with the same name was removed. Continue taking this medication, and follow the directions you see here.   oxyCODONE  5 MG immediate release tablet Commonly known as: Oxy IR/ROXICODONE  Take 1 tablet (5 mg total) by mouth daily as needed for severe pain (pain score 7-10) or breakthrough pain.   ranolazine  1000 MG SR tablet Commonly known as: RANEXA  Take 1 tablet (1,000 mg total) by mouth 2 (two) times daily.  rivaroxaban  20 MG Tabs tablet Commonly known as: XARELTO  Take 1 tablet (20 mg total) by mouth daily.        Disposition and follow-up:   Benjamin Espinoza was discharged from [facility] in Good condition.  At the hospital follow up visit please address:  1.  Stroke: recommend patient to sleep in top bunk at [facility].   Please ensure patient is taking his meds as instructed.   Smoking Cessation: Discuss smoking cessation please   Follow up appointments: please ensure he will follow up with Neurology  and Cardiology outpatient   Elevated Creatinine: Patient had mild elevation in Cr on day of discharge. Please repeat BMP to ensure resolution. If normalized and still needing BP control, consider resuming home Losartan  50 mg daily.   2.  Labs / imaging needed at time of follow-up: BMP  3.  Pending labs/ test needing follow-up: None  Follow-up Appointments:  Follow-up Information     Benjamin Espinoza, [name], DO. Go on 04/05/2024.   Specialty: Internal Medicine Why: Go to your appointment with Dr. Remonia Romano in the Internal Medicine Clinic on 04/05/2024 at 10:15 am. Please arrive 15 minutes early and bring all of your prescriptions in their original bottles. Contact information: [address] [place] [address] [phone]          NEUROLOGY. Schedule an appointment as soon as possible for a visit.   Contact information: [address] [place], [address] [place]  [address] [phone]        Benjamin Donley Joelle VEAR, Benjamin Espinoza. Go on 03/29/2024.   Specialty: Cardiology Why: Go to your appointment with Dr. Ren on 03/29/2024 at 8:00am. Please arrive 20 minutes early and bring all of your medications in their original pill bottles. Contact information: [address] [place] [address] [phone]                  Hospital Course by problem list: Benjamin Espinoza is a 59 y.o. 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 Symptoms stable and predominantly  sensory in the right upper extremity and right face.  Mild motor weakness in the right lower extremity seem to be chronic and related to his vascular disease.  Adherent on medications including Xarelto  and aspirin  but for an unknown reason was not taking atorvastatin .  LDL of 167, not at goal. MRI Brain demonstrated mostly subacute and chronic area of cortex and white matter ischemia in the left parietal lobe but possible small volume diffusion restriction there. No malignant hemorrhagic transformation or mass effect. No other acute intracranial abnormality. Chronic small vessel disease and chronic likely posttraumatic left temporal lobe encephalomalacia otherwise stable. Patient was admitted for stroke workup. Neurology followed during admission. A1c of 5.6. Echo shows unchanged LVEF 50-55%, but with new wall motion abnormalities. Given new wall motion abnormalities and ongoing intermittent chest pain, Cardiology was consulted as below. PT/OT/SLP were consulted and did not recommend anything further. He admits that he has not been taking his Atorvastatin  so resumed  that. He was monitored with telemetry and frequent neuro checks. He was experiencing right shoulder pain that may be MSK in nature. X-ray of the right shoulder was normal with no fracture noted.  - Continue home Xarelto  20 mg daily  - Continue home aspirin  81 mg daily - Resume home atorvastatin  80 mg daily - Tylenol  1000 mg every 8 hours as needed, topical lidocaine  patch, topical Voltaren  gel for shoulder pain   #Chest pain #CAD s/p CABG #HFmrEF Patient has had intermittent chest pain during hospitalization. Previous LHC 05/2023 w/ severe 3 vessel occlusive CAD, patent LIMA to LAD (though with distal occlusion), patent SVG to OM3, patent SVG to OM1, 80% stenosis in proximal SVG, known occlusion of SVG to RCA. At this point he was deemed a poor candidate for PCI given his PSUD and noncompliance with medical therapy and low likelihood of long term  stent patency. However, repeat Echo shows wall motion abnormalities. He is however recovering from substance use and living in rehab. Consulted Cardiology for ongoing intermittent chest pain and new findings on Echo. Cardiology recommended resuming Imdur  90 mg, continuing Coreg  and Aspirin , and starting Ranexa  1000 mg BID. They would like to optimize his medical therapy and if still having chest pain while substance free, then consider repeating LHC outpatient. .  - Continue home aspirin  81 mg daily - Continue home atorvastatin  80 mg daily - Sublingual nitroglycerin  as needed - Continue Carvedilol  3.125 mg BID  - Continue isosorbide  mononitrate 90 mg daily   - Continue Ranexa  1000 mg BID  - f/u outpatient with Cardiology   #AKI Baseline Cr ~ 1.0 - 1.2. Cr elevated to 1.47 on day of discharge which may be mild AKI vs. elevated Creatinine. Likely pre-renal as patient reported he had not been drinking much water during this hospitalization. He had contrast imaging which likely contributed. Encouraged po intake and oral hydration. Hold home Losartan  50 mg daily and consider resuming if Cr normalizes and needs further BP control.  - recheck BMP at f/u   #Hypertension Amlodipine  and losartan  were held on discharge from the hospital in 01/2024 apparently due to normal to low blood pressures. On admission antihypertensives were held to allow for permissive hypertension. His BP was mildly elevated by day of discharge so he was resumed on his home regimen.  - Continue Carvedilol  3.125 mg BID  - Continue isosorbide  mononitrate 90 mg daily    #PAD Significant vascular disease in his lower extremities with admission in December 2025 for critical limb ischemia where he underwent right lower extremity thrombectomy and right lower extremity bypass lysis and relining.  No new or worsening symptoms associated with his lower extremity but he does have baseline decreased sensation and mild weakness in his right lower  extremity.  Extremities were warm and well-perfused with slightly diminished but palpable dorsalis pedis pulses bilaterally. - Continue home Xarelto  20 mg daily  - Continue home aspirin  81 mg daily - Resume home atorvastatin  80 mg daily - Continue Gabapentin  300 mg TID   #Substance Use (cocaine, tobacco) Patient has been long-timer user of cocaine, however last cocaine use was 1 week ago and he then entered drug rehab program at [facility]. UDS on admission negative. Commended patient for efforts of sobriety. Recommend continued smoking cessation counseling outpatient, especially given his vascular disease.  - Nicotine  patches - Continue smoking cessation outpatient     Subjective Patient sitting comfortably on edge of bed. Not reporting any current chest pain though has had right  arm pain especially with movement of his arm. Congratulated him on trying to get his life turned around and stop using cocaine. He feels ready for discharge back to [facility] facility. He does sleep in a top bunk at the facility and OT recommended bottom bunk.   Discharge Exam:   BP 118/86 (BP Location: Left Arm)   Pulse 67   Temp 98.2 F (36.8 C) (Oral)   Resp 18   Ht 6' 1 (1.854 m)   Wt 87 kg   SpO2 99%   BMI 25.30 kg/m   Physical Exam:   Gen: A&Ox4, NAD HEENT: Atraumatic, normocephalic; oropharynx clear, tongue without atrophy or fasciculations. Resp: CTAB, normal work of breathing CV: RRR, extremities appear well-perfused. Abd: soft/NT/ND Extrem: Nml bulk; no cyanosis, clubbing, or edema. Right arm pain with AROM and with good strength, endorsing tenderness to palpation of trapezius    Neuro: *MS: A&O x4. Follows multi-step commands.  *Speech: no dysarthria or aphasia, able to name and repeat. *CN:    I: Deferred   II,III: PERRLA, VFF by confrontation, optic discs not visualized 2/2 pupillary constriction   III,IV,VI: EOMI w/o nystagmus, no ptosis   V: Sensation diminished from V1 to  V3 to LT on right side    VII: Eyelid closure was full.  Smile symmetric.   VIII: Hearing intact to voice   IX,X: Voice normal, palate elevates symmetrically    XI: SCM/trap 5/5 bilat   XII: Tongue protrudes midline, no atrophy or fasciculations  *Motor:   Normal bulk.  No tremor, rigidity or bradykinesia. No pronator drift.    Strength: Dlt Bic Tri WE WrF FgS Gr HF KnF KnE PlF DoF    Left 5 5 5 5 5 5 5 5 5 5 5 5     Right 5 5 5 5 5 5 5 5 5 5 5 5     *Sensory: Intact to light touch, pinprick, temperature vibration throughout. Asymmetric, right diminished compared to left similar to baseline. *Gait: deferred   Skin: warm and dry Psych: mood calm, behavior normal, thought content normal, judgement normal    Pertinent Labs, Studies, and Procedures:     Latest Ref Rng & Units 03/22/2024    2:12 AM 03/21/2024    1:49 AM 03/20/2024    6:48 AM  CBC  WBC 4.0 - 10.5 K/uL 5.1  4.6    Hemoglobin 13.0 - 17.0 g/dL 87.9  87.5  86.6   Hematocrit 39.0 - 52.0 % 37.3  38.8  39.0   Platelets 150 - 400 K/uL 293  292         Latest Ref Rng & Units 03/22/2024    2:12 AM 03/21/2024    1:49 AM 03/20/2024    6:48 AM  CMP  Glucose 70 - 99 mg/dL 89  87  82   BUN 6 - 20 mg/dL 23  21  20    Creatinine 0.61 - 1.24 mg/dL 8.52  8.81  8.69   Sodium 135 - 145 mmol/L 138  135  142   Potassium 3.5 - 5.1 mmol/L 4.1  4.1  4.1   Chloride 98 - 111 mmol/L 103  102  106   CO2 22 - 32 mmol/L 27  25    Calcium  8.9 - 10.3 mg/dL 9.6  9.5      DG Shoulder Right Result Date: 03/21/2024 CLINICAL DATA:  Right shoulder pain. EXAM: RIGHT SHOULDER - 2+ VIEW COMPARISON:  None Available. FINDINGS: No acute fracture or dislocation. The  bones are well mineralized. No arthritic changes. The soft tissues are unremarkable. Median sternotomy wires noted. IMPRESSION: Negative. Electronically Signed   By: Vanetta Chou M.D.   On: 03/21/2024 11:11   ECHOCARDIOGRAM COMPLETE Result Date: 03/20/2024    ECHOCARDIOGRAM REPORT   Patient  Name:   Benjamin Espinoza Date of Exam: 03/20/2024 Medical Rec #:  [id number]          Height:       73.0 in Accession #:    [id number]         Weight:       197.0 lb Date of Birth:  24-Jun-1965          BSA:          2.138 m Patient Age:    58 years           BP:           149/97 mmHg Patient Gender: M                  HR:           71 bpm. Exam Location:  Inpatient Procedure: 2D Echo, Cardiac Doppler and Color Doppler (Both Spectral and Color            Flow Doppler were utilized during procedure). Indications:    Stroke I63.9  History:        Patient has prior history of Echocardiogram examinations, most                 recent 10/15/2023. Cardiomyopathy, Previous Myocardial Infarction                 and CAD; Signs/Symptoms:Chest Pain.  Sonographer:    Sydnee Wilson RDCS Referring Phys: [id number] NATHAN PICKERING IMPRESSIONS  1. Left ventricular ejection fraction, by estimation, is 50 to 55%. The left ventricle has low normal function. The left ventricle demonstrates regional wall motion abnormalities (see scoring diagram/findings for description). There is mild concentric left ventricular hypertrophy. Left ventricular diastolic parameters are consistent with Grade I diastolic dysfunction (impaired relaxation). There is mild hypokinesis of the left ventricular, basal inferoseptal wall and inferior wall. There is mild hypokinesis of the left ventricular, apical inferior wall, septal wall and apical segment.  2. Right ventricular systolic function is normal. The right ventricular size is normal. Tricuspid regurgitation signal is inadequate for assessing PA pressure.  3. The mitral valve is normal in structure. Trivial mitral valve regurgitation. No evidence of mitral stenosis.  4. The aortic valve is tricuspid. Aortic valve regurgitation is mild to moderate. No aortic stenosis is present.  5. Aortic dilatation noted. There is mild dilatation of the aortic root, measuring 41 mm.  6. The inferior vena cava is normal in size  with greater than 50% respiratory variability, suggesting right atrial pressure of 3 mmHg. Comparison(s): Prior images reviewed side by side. The left ventricular inferior wall abnormality is old. The apical wall motion abnormality appears to be new. FINDINGS  Left Ventricle: Left ventricular ejection fraction, by estimation, is 50 to 55%. The left ventricle has low normal function. The left ventricle demonstrates regional wall motion abnormalities. Mild hypokinesis of the left ventricular, basal inferoseptal  wall and inferior wall. Mild hypokinesis of the left ventricular, apical inferior wall, septal wall and apical segment. The left ventricular internal cavity size was normal in size. There is mild concentric left ventricular hypertrophy. Left ventricular  diastolic parameters are consistent with Grade I diastolic dysfunction (impaired relaxation). Normal left  ventricular filling pressure.  LV Wall Scoring: The apical septal segment, basal inferior segment, apical inferior segment, basal inferoseptal segment, and apex are hypokinetic. The entire anterior wall, entire lateral wall, anterior septum, mid inferoseptal segment, and mid inferior segment are normal. Right Ventricle: The right ventricular size is normal. No increase in right ventricular wall thickness. Right ventricular systolic function is normal. Tricuspid regurgitation signal is inadequate for assessing PA pressure. Left Atrium: Left atrial size was normal in size. Right Atrium: Right atrial size was normal in size. Pericardium: There is no evidence of pericardial effusion. Mitral Valve: The mitral valve is normal in structure. Trivial mitral valve regurgitation. No evidence of mitral valve stenosis. Tricuspid Valve: The tricuspid valve is normal in structure. Tricuspid valve regurgitation is trivial. Aortic Valve: The aortic valve is tricuspid. Aortic valve regurgitation is mild to moderate. Aortic regurgitation PHT measures 137 msec. No aortic  stenosis is present. Aortic valve mean gradient measures 5.0 mmHg. Aortic valve peak gradient measures 8.1 mmHg. Aortic valve area, by VTI measures 4.02 cm. Pulmonic Valve: The pulmonic valve was normal in structure. Pulmonic valve regurgitation is trivial. No evidence of pulmonic stenosis. Aorta: Aortic dilatation noted. There is mild dilatation of the aortic root, measuring 41 mm. Venous: The inferior vena cava is normal in size with greater than 50% respiratory variability, suggesting right atrial pressure of 3 mmHg. IAS/Shunts: No atrial level shunt detected by color flow Doppler.  LEFT VENTRICLE PLAX 2D LVIDd:         4.90 cm      Diastology LVIDs:         3.90 cm      LV e' medial:    6.42 cm/s LV PW:         1.20 cm      LV E/e' medial:  10.6 LV IVS:        1.30 cm      LV e' lateral:   10.10 cm/s LVOT diam:     2.10 cm      LV E/e' lateral: 6.7 LV SV:         100 LV SV Index:   47 LVOT Area:     3.46 cm  LV Volumes (MOD) LV vol d, MOD A2C: 135.0 ml LV vol d, MOD A4C: 167.0 ml LV vol s, MOD A2C: 62.7 ml LV vol s, MOD A4C: 80.9 ml LV SV MOD A2C:     72.3 ml LV SV MOD A4C:     167.0 ml LV SV MOD BP:      80.9 ml RIGHT VENTRICLE RV S prime:     11.00 cm/s TAPSE (M-mode): 1.8 cm LEFT ATRIUM             Index        RIGHT ATRIUM           Index LA diam:        3.40 cm 1.59 cm/m   RA Area:     13.50 cm LA Vol (A2C):   39.8 ml 18.62 ml/m  RA Volume:   27.30 ml  12.77 ml/m LA Vol (A4C):   40.4 ml 18.90 ml/m LA Biplane Vol: 40.7 ml 19.04 ml/m  AORTIC VALVE AV Area (Vmax):    3.49 cm AV Area (Vmean):   3.43 cm AV Area (VTI):     4.02 cm AV Vmax:           142.00 cm/s AV Vmean:  103.000 cm/s AV VTI:            0.249 m AV Peak Grad:      8.1 mmHg AV Mean Grad:      5.0 mmHg LVOT Vmax:         143.00 cm/s LVOT Vmean:        102.000 cm/s LVOT VTI:          0.289 m LVOT/AV VTI ratio: 1.16 AI PHT:            137 msec  AORTA Ao Root diam: 4.10 cm Ao Asc diam:  3.90 cm MITRAL VALVE MV Area (PHT): 3.07 cm     SHUNTS MV Decel Time: 247 msec    Systemic VTI:  0.29 m MV E velocity: 68.10 cm/s  Systemic Diam: 2.10 cm MV A velocity: 79.70 cm/s MV E/A ratio:  0.85 Mihai Croitoru Benjamin Espinoza Electronically signed by Jerel Balding Benjamin Espinoza Signature Date/Time: 03/20/2024/4:00:50 PM    Final    MR BRAIN WO CONTRAST Result Date: 03/20/2024 EXAM: MRI BRAIN WITHOUT CONTRAST 03/20/2024 09:14:54 AM TECHNIQUE: Multiplanar multisequence MRI of the head/brain was performed without the administration of intravenous contrast. COMPARISON: CT head, CTA, CT perfusion earlier today. Brain MRI 10/13/2023. CLINICAL HISTORY: 59 year old male with acute neuro deficit, stroke suspected, and code stroke presentation this morning. FINDINGS: BRAIN AND VENTRICLES: Diffusion imaging demonstrates normal diffusion posteriorly in the left MCA territory, left parietal lobe cortex and subcortical white matter, with the area mostly facilitated on ADC. A small rim of restricted diffusion is possible (series 2, image 37). Developing encephalomalacia is present in this region. Mild hemosiderin is noted. There is no regional mass effect. No other convincing diffusion restriction is identified. Widely scattered cerebral white matter T2 and FLAIR hyperintensity, chronic left lateral temporal lobe encephalomalacia, and comparatively mild T2 heterogeneity in the pons are stable. Deep gray nuclei also relatively spared. Chronic cerebral blood products including in the left occipital lobe, cerebellar vermis, appear stable. No mass. No midline shift. No hydrocephalus. The sella is unremarkable. Major vascular flow voids are stable. ORBITS: No significant abnormality. SINUSES AND MASTOIDS: Paranasal sinus mucosal thickening and opacification again noted. Visible mastoids and internal auditory structures appear stable and negative. BONES AND SOFT TISSUES: Normal marrow signal. No soft tissue abnormality. IMPRESSION: 1. Mostly subacute and chronic area of cortex and white matter  ischemia in the left parietal lobe, but possible small volume diffusion restriction there. No malignant hemorrhagic transformation or mass effect. 2. No other acute intracranial abnormality. Chronic small vessel disease, possibly posttraumatic left temporal lobe encephalomalacia otherwise stable. Electronically signed by: Helayne Hurst Benjamin Espinoza 03/20/2024 09:29 AM EST RP Workstation: [id number]   CT CEREBRAL PERFUSION W CONTRAST Result Date: 03/20/2024 EXAM: CT BRAIN PERFUSION 03/20/2024 07:10:38 AM TECHNIQUE: Cerebral perfusion analysis using computed tomography with contrast administration, including post-processing of parametric maps with determination of cerebral blood flow, cerebral blood volume, mean transit time and time-to-maximum. Automated exposure control, iterative reconstruction, and/or weight based adjustment of the mA/kV was utilized to reduce the radiation dose to as low as reasonably achievable. 40 mL of iohexol  (OMNIPAQUE ) 350 MG/ML injection was administered. COMPARISON: None available. CLINICAL HISTORY: 59 year old male with stroke presentation today. Abnormal intracranial arteries on Computed Tomography Angiography. FINDINGS: CT PERFUSION: EXAM QUALITY: The examination is adequate with diagnostic perfusion maps. No significant motion artifact. Appropriate arterial inflow and venous outflow curves. CORE INFARCT (CBF<30% volume): 0 mL. No cerebral blood flow (CBF) or cerebral blood volume (CBV) parameter abnormality detected. TOTAL HYPOPERFUSION (Tmax>6s  volume): 12 mL. However, symmetrically affecting both inferior frontal gyri and likely to be artifactual. PENUMBRA: Mismatch volume: probably zero as above. Mismatch ratio: Not applicable Location: Not applicable IMPRESSION: 1. Negative CT perfusion aside from small and symmetric volume of oligemia in the inferior frontal gyri - felt likely to be artifactual. 2. CTA today reported separately. Electronically signed by: Helayne Hurst Benjamin Espinoza 03/20/2024 07:30 AM  EST RP Workstation: [id number]   CT Angio Chest/Abd/Pel for Dissection W and/or Wo Contrast Result Date: 03/20/2024 CLINICAL DATA:  Right arm numbness and chest pain. Clinical concern for acute aortic syndrome. EXAM: CT ANGIOGRAPHY CHEST, ABDOMEN AND PELVIS TECHNIQUE: Non-contrast CT of the chest was initially obtained. Multidetector CT imaging through the chest, abdomen and pelvis was performed using the standard protocol during bolus administration of intravenous contrast. Multiplanar reconstructed images and MIPs were obtained and reviewed to evaluate the vascular anatomy. RADIATION DOSE REDUCTION: This exam was performed according to the departmental dose-optimization program which includes automated exposure control, adjustment of the mA and/or kV according to patient size and/or use of iterative reconstruction technique. CONTRAST:  OMNIPAQUE  IOHEXOL  350 MG/ML SOLN COMPARISON:  10/13/2023 FINDINGS: CTA CHEST FINDINGS Cardiovascular: Pre contrast imaging shows no hyperdense crescent in the wall of the thoracic aorta to suggest the presence of an acute intramural hematoma. Ascending thoracic aorta again measures 4.7 cm at the level of the sinuses of Valsalva. Ascending thoracic aorta measures 4 cm diameter as before. No dissection of the thoracic aorta. Status post CABG. No large central pulmonary embolus in the main or lobar pulmonary arteries. Mediastinum/Nodes: No mediastinal lymphadenopathy. 3.4 x 1.5 cm soft tissue density in the retrosternal mediastinum is similar to prior not substantially changed when measured at the same level today as on the previous exam. There is no hilar lymphadenopathy. The esophagus has normal imaging features. There is no axillary lymphadenopathy. Lungs/Pleura: The lungs are clear without focal pneumonia, edema, pneumothorax or pleural effusion. Musculoskeletal: No worrisome lytic or sclerotic osseous abnormality. Stable midthoracic superior vertebral endplate compression  deformity. Review of the MIP images confirms the above findings. CTA ABDOMEN AND PELVIS FINDINGS VASCULAR Aorta: Infrarenal abdominal aorta measures 3.1 cm maximum diameter today compared to 3.0 cm previously. Mild-to-moderate atherosclerotic calcification noted without stenosis. Celiac: Patent without evidence of aneurysm, dissection, vasculitis or significant stenosis. SMA: Patent without evidence of aneurysm, dissection, vasculitis or significant stenosis. Renals: Both renal arteries are patent without evidence of aneurysm, dissection, vasculitis, fibromuscular dysplasia or significant stenosis. IMA: Patent without evidence of aneurysm, dissection, vasculitis or significant stenosis. Inflow: Common iliac arteries patent. External iliac arteries patent. 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 Veins: No obvious venous abnormality within the limitations of this arterial phase study. Review of the MIP images confirms the above findings. NON-VASCULAR Hepatobiliary: No suspicious focal abnormality within the liver parenchyma. Gallbladder is decompressed. No intrahepatic or extrahepatic biliary dilation. Pancreas: No focal mass lesion. No dilatation of the main duct. No intraparenchymal cyst. No peripancreatic edema. Spleen: No splenomegaly. No suspicious focal mass lesion. Adrenals/Urinary Tract: No adrenal nodule or mass. Kidneys unremarkable. No evidence for hydroureter. The urinary bladder appears normal for the degree of distention. Stomach/Bowel: Stomach is distended with food and fluid. Duodenum is normally positioned as is the ligament of Treitz. No small bowel wall thickening. No small bowel dilatation. The terminal ileum is normal. The appendix is best seen on coronal images and is unremarkable. No gross colonic mass. No colonic wall thickening. Lymphatic: There  is no gastrohepatic or hepatoduodenal ligament lymphadenopathy. No retroperitoneal  or mesenteric lymphadenopathy. No pelvic sidewall lymphadenopathy. Reproductive: The prostate gland and seminal vesicles are unremarkable. Other: No substantial intraperitoneal free fluid. Musculoskeletal: Cortical lucency right iliac crest is stable in the interval. This lesion was present on the study from 05/24/2020 suggesting benign etiology. Review of the MIP images confirms the above findings. IMPRESSION: 1. No acute findings in the chest, abdomen, or pelvis. Specifically, no evidence for acute intramural hematoma or dissection of the thoracoabdominal aorta. 2. Stable 4.7 cm ascending thoracic aortic aneurysm. Continued annual imaging follow-up recommended. 3. Stable 3.1 cm infrarenal abdominal aortic aneurysm. Recommend follow-up ultrasound every 3 years. 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 4. Stable 3.4 x 1.5 cm soft tissue density in the retrosternal mediastinum. This is likely related to prior CABG and may reflect scar or fluid within a pericardial recess. 5. Stable cortical lucency right iliac crest. This lesion was present on the study from 05/24/2020 suggesting benign etiology. Electronically Signed   By: Camellia Candle M.D.   On: 03/20/2024 07:25   CT ANGIO HEAD NECK W WO CM Result Date: 03/20/2024 EXAM: CTA HEAD AND NECK WITHOUT AND WITH 03/20/2024 07:00:57 AM TECHNIQUE: CTA of the head and neck was performed without and with the administration of 100 mL iohexol  (OMNIPAQUE ) 350 MG/ML injection. Multiplanar 2D and/or 3D reformatted images are provided for review. Automated exposure control, iterative reconstruction, and/or weight based adjustment of the mA/kV was utilized to reduce the radiation dose to as low as reasonably achievable. Stenosis of the internal carotid arteries measured using NASCET criteria. Dense right subclavian venous contrast streak artifact limits some detail of the proximal great vessels, especially the right  CCA origin and proximal right subclavian artery. Suboptimal arterial contrast bolus. COMPARISON: Plain head CT 03/20/2024, previous CTA head and neck 08/14/last year. CLINICAL HISTORY: 59 year old male with acute neuro deficit, stroke suspected, upper extremity numbness, previous left MCA infarct, previous sternotomy, and previous CABG. FINDINGS: CTA NECK: AORTIC ARCH AND ARCH VESSELS: No dissection or arterial injury. No significant stenosis of the brachiocephalic or subclavian arteries, except for proximal left subclavian mild atherosclerosis. CERVICAL CAROTID ARTERIES: Visible right CCA with no atherosclerosis or stenosis. Stable mild proximal right ICA calcified plaque without stenosis. Mild left CCA tortuosity without significant plaque or stenosis. Mild proximal left ICA calcified plaque without stenosis is stable. No dissection, arterial injury, or hemodynamically significant stenosis by NASCET criteria. CERVICAL VERTEBRAL ARTERIES: Late entry of the right vertebral artery into the cervical transverse foramen on series 5 image 241, normal variant. Mildly dominant right vertebral artery in the neck. Proximal left subclavian and mild left vertebral artery origin atherosclerosis, mild left V1 segment atherosclerosis, no cervical left vertebral artery stenosis. No dissection, arterial injury, or significant stenosis. LUNGS AND MEDIASTINUM: Visible central pulmonary arteries enhancing and grossly patent. Prior CABG. No acute finding in the visible upper chest. SOFT TISSUES: No acute abnormality. BONES: Prior sternotomy.  No acute abnormality. CTA HEAD: ANTERIOR CIRCULATION: Right ICA siphon moderate soft and calcified atherosclerosis. Moderate to severe right anterior genu ICA stenosis (series 5 image 138) appears acute but progressed since August. Left ICA siphon similar soft and calcified atherosclerosis with moderate to severe left petrous and cavernous segment junction stenosis appears stable from August. ICA  termini remain patent. Nondominant left ACA A1 segment is stable, normal variation. Normal anterior communicating artery. Mild ACA branch irregularity with no branch occlusion identified. Right MCA branch irregularity  is chronic, right MCA branches appear stable without discrete occlusion. Left MCA M1 segment mild to moderate tortuosity and irregularity is chronic. Left MCA bifurcation is patent. However, there is generalized left MCA hypoenhancement now compared to August (series 12 image 24). No discrete emergent large vessel occlusion is identified. No aneurysm. POSTERIOR CIRCULATION: Moderate bilateral distal vertebral artery atherosclerotic appearing irregularity. PICA origins are normal. Moderate left vertebral V4 segment stenosis. Mild right V4 segment stenosis appears stable since last year. Similar mild to moderate basilar artery irregularity, mild basilar artery stenosis is stable. Bilateral PCA branches appear stable with irregularity but no proximal PCA occlusion. SCA and PCA origins remain patent. Diminutive or absent posterior communicating arteries. No aneurysm. OTHER: Insufficient contrast for evaluation of intracranial venous structures. IMPRESSION: 1. Generalized left MCA hypoenhancement, but No discrete emergent large vessel occlusion. Possible progressed intracranial atherosclerosis since August (see #2). This was discussed by telephone with Dr. Arora at 0720 hours on 03/20/2024, and he advises the patient also positive for Cocaine. So drug-related left MCA vasospasm is also a consideration. 2. Other moderate and severe widespread intracranial atherosclerosis appears stable aside from possibly progressed and severe atherosclerotic stenosis at the anterior genu of the right ICA siphon. 3. Comparatively mild extracranial atherosclerosis, no significant extracranial stenosis. Electronically signed by: Helayne Hurst Benjamin Espinoza 03/20/2024 07:24 AM EST RP Workstation: [id number]   CT HEAD CODE STROKE WO  CONTRAST (LKW 0-4.5h, LVO 0-24h) Result Date: 03/20/2024 EXAM: CT HEAD WITHOUT CONTRAST 03/20/2024 06:40:00 AM TECHNIQUE: CT of the head was performed without the administration of intravenous contrast. Automated exposure control, iterative reconstruction, and/or weight based adjustment of the mA/kV was utilized to reduce the radiation dose to as low as reasonably achievable. COMPARISON: Brain MRI 10/13/2023, CT head 11/18/2023. CLINICAL HISTORY: 59 year old male. Code stroke presentation, upper extremity numbness. FINDINGS: BRAIN AND VENTRICLES: No acute hemorrhage. No evidence of acute infarct. No hydrocephalus. No extra-axial collection. No mass effect or midline shift. Stable brain volume. Chronic left lateral temporal lobe encephalomalacia, thought to be posttraumatic based on previous MRI. Addressed but nonacute appearing cortical and subcortical white matter encephalomalacia in the superior left perirolandic area (series 3 image 23). Stable gray white differentiation elsewhere. Patchy chronic white matter hypodensity in both hemispheres. No suspicious intracranial vascular hyperdensity. Calcified atherosclerosis at the skull base. ORBITS: No acute abnormality. SINUSES: Rest of the sphenoid paranasal sinus opacification, mucosal thickening. Tympanic cavities and mastoids are well aerated. SOFT TISSUES AND SKULL: Benign left occipital convexity scalp lipoma incidentally noted and stable. No acute soft tissue abnormality. No skull fracture. ALBERTA STROKE PROGRAM EARLY CT SCORE (ASPECTS): Ganglionic (caudate, IC, lentiform nucleus, insula, M1-M3): 7. Supraganglionic (M4-M6): 3. Total: 10. IMPRESSION: 1. Posterior left MCA territory ischemia appears progressed since September but non-acute by CT. No acute intracranial abnormality, ASPECTS 10. 2. These results were communicated to Dr. Arora at 0701 hours on 03/20/2024 by text page via the [facility] messaging system. Electronically signed by: Helayne Hurst Benjamin Espinoza 03/20/2024  07:02 AM EST RP Workstation: [id number]     Discharge Instructions: Discharge Instructions     Increase activity slowly   Complete by: As directed          Discharge Instructions      To Mr. JAWARA LATORRE or their caretakers,  They were admitted to [facility] on 03/20/2024 for evaluation and treatment of: right shoulder pain and arm tingling      The evaluation suggested you recently had a stroke, though unclear if this is contributing to  your symptoms currently or not. You were treated with medication management and close monitoring.  They were discharged from the hospital on 03/22/24. I recommend the following after leaving the hospital:   Please take your medications as listed on your discharge summary as some modifications were made.    Please follow up:   Follow-up Information     Benjamin Espinoza, [name], DO. Go on 04/05/2024.   Specialty: Internal Medicine Why: Go to your appointment with Dr. Remonia Romano in the Internal Medicine Clinic on 04/05/2024 at 10:15 am. Please arrive 15 minutes early and bring all of your prescriptions in their original bottles. Contact information: [address] [place] [address] [phone]         [facility]. Schedule an appointment as soon as possible for a visit.   Contact information: [address] [place], [address] [place]  [address] [phone]        Benjamin Donley Joelle VEAR, Benjamin Espinoza. Go on 03/29/2024.   Specialty: Cardiology Why: Go to your appointment with Dr. Ren on 03/29/2024 at 8:00am. Please arrive 20 minutes early and bring all of your medications in their original pill bottles. Contact information: [address] [place] [address] [phone]                  We do recommend sleeping in a bottom bunk based on your Occupational Therapy assessment during hospitalization.    For questions about your care plan, until you are able to see  your primary doctor: Call [phone]. Dial 0 for the operator. Ask for the internal medicine resident on call.  Thank you for allowing us  to be part of your care.   Doyal Miyamoto, Benjamin Espinoza 03/22/2024, 12:13 PM        Signed: Doyal Miyamoto, Benjamin Espinoza 03/22/2024, 1:32 PM    "

## 2024-03-21 NOTE — Discharge Instructions (Addendum)
 To [name] or their caretakers,  They were admitted to [facility] on 03/20/2024 for evaluation and treatment of: right shoulder pain and arm tingling      The evaluation suggested you recently had a stroke, though unclear if this is contributing to your symptoms currently or not. You were treated with medication management and close monitoring.  They were discharged from the hospital on 03/22/24. I recommend the following after leaving the hospital:   Please take your medications as listed on your discharge summary as some modifications were made.    Please follow up:   Follow-up Information     [name], [name], DO. Go on 04/05/2024.   Specialty: Internal Medicine Why: Go to your appointment with Dr. Remonia Romano in the Internal Medicine Clinic on 04/05/2024 at 10:15 am. Please arrive 15 minutes early and bring all of your prescriptions in their original bottles. Contact information: [address] [place] [address] [phone]         [place] [facility]. Schedule an appointment as soon as possible for a visit.   Contact information: [address] [place], [address] [place]  [address] [phone]        Ren Donley Joelle VEAR, MD. Go on 03/29/2024.   Specialty: Cardiology Why: Go to your appointment with Dr. Ren on 03/29/2024 at 8:00am. Please arrive 20 minutes early and bring all of your medications in their original pill bottles. Contact information: [address] [place] [address] [phone]                  We do recommend sleeping in a bottom bunk based on your Occupational Therapy assessment during hospitalization.    For questions about your care plan, until you are able to see your primary doctor: Call [phone]. Dial 0 for the operator. Ask for the internal medicine resident on call.  Thank you for allowing us  to be part of your care.   Doyal Miyamoto,  MD 03/22/2024, 12:13 PM

## 2024-03-21 NOTE — Progress Notes (Signed)
 SPIRITUAL CARE AND COUNSELING CONSULT NOTE   VISIT SUMMARY Chaplain responded to request for spiritual care and a Bible. Pt Benjamin Espinoza shared that he lives in [facility] currently and is very motivated to continue his recovery. Bible and spiritual interventions provided as listed below.   [name] stated he was continuing to have chest pains and took a nitroglycerin  while I was in the room. I shared this with the RN.  Chaplains remain available as further needs arise.  SPIRITUAL ENCOUNTER                                                                                                                                                                      Type of Visit: Initial Care provided to:: Patient Referral source: Patient request Reason for visit: Routine spiritual support OnCall Visit: No  SPIRITUAL FRAMEWORK  Presenting Themes: Goals in life/care, Significant life change, Community and relationships   GOALS   Self/Personal Goals: Recovery from substance abuse, deeper relationship with his two daughters   INTERVENTIONS   Spiritual Care Interventions Made: Established relationship of care and support, Compassionate presence, Reflective listening, Self-care teaching, Encouragement, Prayer, Other (comment) (Provided a Bible)   INTERVENTION OUTCOMES   Outcomes: Connection to spiritual care, Awareness around self/spiritual resourses, Autonomy/agency, Awareness of support, Reduced anxiety  If immediate needs arise, please contact [facility] 24 hour on call [phone]   Donnice JINNY Shuck, Chaplain  03/21/2024 1:09 PM

## 2024-03-21 NOTE — Evaluation (Signed)
 Occupational Therapy Evaluation Patient Details Name: KALIX MEINECKE MRN: [id number] DOB: [birth year] Today's Date: 03/21/2024   History of Present Illness   Gevorg Brum is a 59 y/o Male admitted from a drug rehab program 03/20/24 due to right shoulder pain/tingling, chest pressure (similar symptoms to his CVA in 8/25). MRI revealed Mostly subacute and chronic area of cortex and white matter ischemia in the left parietal lobe, but possible small volume diffusion restriction there. 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 Been there about a week. Today he is able to demonstrate UB and LB ADL, standing tolerance and balance for grooming/bathing, reports no problems with self-feeding/grooming or tasks like opening containers. Pt DOES report decreased sensation in RUE - functionally it is at baseline. OT educated on BE FAST and Pt was left ambulating at Mod I level with PT in hallway for gait assessment. OT will sign off at this time as Pt at baseline for ADL and functional transfers.   Of note: Pt states the hardest thing for him is that he is on the top bunk at [facility] house. Recommended he self-advocate for a lower bunk.    If plan is discharge home, recommend the following:         Functional Status Assessment   Patient has not had a recent decline in their functional status     Equipment Recommendations   None recommended by OT     Recommendations for Other Services         Precautions/Restrictions   Precautions Precautions: Fall Recall of Precautions/Restrictions: Intact Restrictions Weight Bearing Restrictions Per Provider Order: No     Mobility Bed Mobility Overal bed  mobility: Independent                  Transfers Overall transfer level: Modified independent Equipment used: Straight cane                      Balance Overall balance assessment: Mild deficits observed, not formally tested (baseline)                                         ADL either performed or assessed with clinical judgement   ADL Overall ADL's : Modified independent                                       General ADL Comments: Pt able to demonstrate UB and LB ADL, reports no problems feeding himself and doing tasks like oral care (including opening containers), able to demonstrate transfers and ambulation with [place] without assist     Vision Patient Visual Report: No change from baseline Vision Assessment?: No apparent visual deficits     Perception         Praxis         Pertinent Vitals/Pain Pain Assessment Pain Assessment: Faces Faces Pain Scale: Hurts a little bit Pain Location: neck into R shoulder Pain Descriptors / Indicators: Tightness Pain Intervention(s): Monitored during session, Repositioned  Extremity/Trunk Assessment Upper Extremity Assessment Upper Extremity Assessment: RUE deficits/detail RUE Deficits / Details: strength symmetric to LUE, reports decreased sensation and tingling that comes and goes RUE Sensation: decreased light touch RUE Coordination:  (increased time for fine motor during assessment)   Lower Extremity Assessment Lower Extremity Assessment: Defer to PT evaluation   Cervical / Trunk Assessment Cervical / Trunk Assessment: Normal   Communication Communication Communication: No apparent difficulties   Cognition Arousal: Alert Behavior During Therapy: WFL for tasks assessed/performed Cognition: No apparent impairments                               Following commands: Intact       Cueing  General Comments   Cueing Techniques: Verbal cues       Exercises     Shoulder Instructions      Home Living Family/patient expects to be discharged to:: Private residence [facility]) Living Arrangements: Non-relatives/Friends   Type of Home: Group Home Home Access: Stairs to enter     Home Layout: Multi-level     Bathroom Shower/Tub: Walk-in shower;Door   [employer] Toilet: Standard Bathroom Accessibility: Yes How Accessible: Accessible via walker Home Equipment: [place] - single point   Additional Comments: Pt at residential drug rehab program at [facility]. Is assigned an upper bunk.      Prior Functioning/Environment Prior Level of Function : Needs assist             Mobility Comments: Use of SPC for mobility ADLs Comments: does his own bathing/dressing    OT Problem List: Impaired sensation   OT Treatment/Interventions:        OT Goals(Current goals can be found in the care plan section)   Acute Rehab OT Goals Patient Stated Goal: get a lower bunk and return to [facility] house OT Goal Formulation: With patient Time For Goal Achievement: 04/04/24 Potential to Achieve Goals: Good   OT Frequency:       Co-evaluation              AM-PAC OT 6 Clicks Daily Activity     Outcome Measure Help from another person eating meals?: None Help from another person taking care of personal grooming?: None Help from another person toileting, which includes using toliet, bedpan, or urinal?: None Help from another person bathing (including washing, rinsing, drying)?: None Help from another person to put on and taking off regular upper body clothing?: None Help from another person to put on and taking off regular lower body clothing?: None 6 Click Score: 24   End of Session Equipment Utilized During Treatment: Other (comment) [facility] Nurse Communication: Mobility status  Activity Tolerance: Patient tolerated treatment well Patient left: Other (comment) (walking in hallway with PT)  OT Visit  Diagnosis: Other symptoms and signs involving the nervous system (R29.898)                Time: 0947-1000 OT Time Calculation (min): 13 min Charges:  OT General Charges $OT Visit: 1 Visit OT Evaluation $OT Eval Low Complexity: 1 Low  Leita DEL OTR/L Acute Rehabilitation Services Office: [phone]  Leita PARAS [facility] 03/21/2024, 10:42 AM

## 2024-03-21 NOTE — Plan of Care (Signed)
" °  Problem: Education: Goal: Knowledge of disease or condition will improve Outcome: Progressing   Problem: Ischemic Stroke/TIA Tissue Perfusion: Goal: Complications of ischemic stroke/TIA will be minimized Outcome: Progressing   Problem: Coping: Goal: Will verbalize positive feelings about self Outcome: Progressing   Problem: Health Behavior/Discharge Planning: Goal: Ability to manage health-related needs will improve Outcome: Progressing Goal: Goals will be collaboratively established with patient/family Outcome: Progressing   "

## 2024-03-21 NOTE — ED Notes (Signed)
 Pt in bed, pt states that he had some chest pressure and took his own nitroglycering, advised pt to refrain from taking his own medication and to take hospital prescribed ones to avoid potential overdose on interaction

## 2024-03-21 NOTE — Plan of Care (Signed)
" °  Problem: Education: Goal: Knowledge of disease or condition will improve 03/21/2024 1651 by Azaryah Oleksy M, RN Outcome: Progressing 03/21/2024 1631 by Othniel Maret M, RN Outcome: Progressing   Problem: Ischemic Stroke/TIA Tissue Perfusion: Goal: Complications of ischemic stroke/TIA will be minimized 03/21/2024 1651 by Aleathea Pugmire M, RN Outcome: Progressing 03/21/2024 1631 by Lexxi Koslow M, RN Outcome: Progressing   Problem: Coping: Goal: Will verbalize positive feelings about self 03/21/2024 1651 by Orie Ananias HERO, RN Outcome: Progressing 03/21/2024 1631 by Druscilla Petsch M, RN Outcome: Progressing   Problem: Health Behavior/Discharge Planning: Goal: Ability to manage health-related needs will improve Outcome: Progressing Goal: Goals will be collaboratively established with patient/family Outcome: Progressing   "

## 2024-03-21 NOTE — Progress Notes (Signed)
 STROKE TEAM PROGRESS NOTE   SUBJECTIVE (INTERVAL HISTORY) No family is at the bedside.  Patient sitting at the edge of bed, complaining of chest discomfort and right arm placed feeling numb.  While lifting right arm feeling pain at the shoulder.   Patient stated that he has been in drug rehab program for a week.  Is not using any cocaine or smoking for a week now.    OBJECTIVE Temp:  [97.7 F (36.5 C)-98.7 F (37.1 C)] 97.7 F (36.5 C) (01/21 1509) Pulse Rate:  [47-76] 70 (01/21 1509) Cardiac Rhythm: Normal sinus rhythm (01/21 1508) Resp:  [9-18] 17 (01/21 1509) BP: (114-163)/(77-101) 163/94 (01/21 1509) SpO2:  [99 %-100 %] 100 % (01/21 1509)  No results for input(s): GLUCAP in the last 168 hours. Recent Labs  Lab 03/20/24 0643 03/20/24 0648 03/21/24 0149  NA 138 142 135  K 4.1 4.1 4.1  CL 106 106 102  CO2 24  --  25  GLUCOSE 84 82 87  BUN 19 20 21*  CREATININE 1.20 1.30* 1.18  CALCIUM  9.8  --  9.5   Recent Labs  Lab 03/20/24 0643  AST 20  ALT 13  ALKPHOS 72  BILITOT 0.3  PROT 7.2  ALBUMIN 3.5   Recent Labs  Lab 03/20/24 0643 03/20/24 0648 03/21/24 0149  WBC 4.5  --  4.6  NEUTROABS 2.3  --   --   HGB 12.7* 13.3 12.4*  HCT 39.9 39.0 38.8*  MCV 92.8  --  93.3  PLT 310  --  292   No results for input(s): CKTOTAL, CKMB, CKMBINDEX, TROPONINI in the last 168 hours. Recent Labs    03/20/24 0643  LABPROT 18.0*  INR 1.4*   No results for input(s): COLORURINE, LABSPEC, PHURINE, GLUCOSEU, HGBUR, BILIRUBINUR, KETONESUR, PROTEINUR, UROBILINOGEN, NITRITE, LEUKOCYTESUR in the last 72 hours.  Invalid input(s): APPERANCEUR     Component Value Date/Time   CHOL 227 (H) 03/21/2024 0149   TRIG 55 03/21/2024 0149   HDL 49 03/21/2024 0149   CHOLHDL 4.7 03/21/2024 0149   VLDL 11 03/21/2024 0149   LDLCALC 167 (H) 03/21/2024 0149   Lab Results  Component Value Date   HGBA1C 5.6 03/21/2024      Component Value Date/Time   LABOPIA  NEGATIVE 03/20/2024 0733   COCAINSCRNUR NEGATIVE 03/20/2024 0733   LABBENZ NEGATIVE 03/20/2024 0733   AMPHETMU NEGATIVE 03/20/2024 0733   THCU NEGATIVE 03/20/2024 0733   LABBARB NEGATIVE 03/20/2024 0733    Recent Labs  Lab 03/20/24 0643  ETH <15    I have personally reviewed the radiological images below and agree with the radiology interpretations.  DG Shoulder Right Result Date: 03/21/2024 CLINICAL DATA:  Right shoulder pain. EXAM: RIGHT SHOULDER - 2+ VIEW COMPARISON:  None Available. FINDINGS: No acute fracture or dislocation. The bones are well mineralized. No arthritic changes. The soft tissues are unremarkable. Median sternotomy wires noted. IMPRESSION: Negative. Electronically Signed   By: Vanetta Chou M.D.   On: 03/21/2024 11:11   ECHOCARDIOGRAM COMPLETE Result Date: 03/20/2024    ECHOCARDIOGRAM REPORT   Patient Name:   NUH LIPTON Date of Exam: 03/20/2024 Medical Rec #:  [id number]          Height:       73.0 in Accession #:    [id number]         Weight:       197.0 lb Date of Birth:  [birth year]          BSA:  2.138 m Patient Age:    59 years           BP:           149/97 mmHg Patient Gender: M                  HR:           71 bpm. Exam Location:  Inpatient Procedure: 2D Echo, Cardiac Doppler and Color Doppler (Both Spectral and Color            Flow Doppler were utilized during procedure). Indications:    Stroke I63.9  History:        Patient has prior history of Echocardiogram examinations, most                 recent 10/15/2023. Cardiomyopathy, Previous Myocardial Infarction                 and CAD; Signs/Symptoms:Chest Pain.  Sonographer:    Sydnee Wilson RDCS Referring Phys: [id number] NATHAN PICKERING IMPRESSIONS  1. Left ventricular ejection fraction, by estimation, is 50 to 55%. The left ventricle has low normal function. The left ventricle demonstrates regional wall motion abnormalities (see scoring diagram/findings for description). There is mild concentric left  ventricular hypertrophy. Left ventricular diastolic parameters are consistent with Grade I diastolic dysfunction (impaired relaxation). There is mild hypokinesis of the left ventricular, basal inferoseptal wall and inferior wall. There is mild hypokinesis of the left ventricular, apical inferior wall, septal wall and apical segment.  2. Right ventricular systolic function is normal. The right ventricular size is normal. Tricuspid regurgitation signal is inadequate for assessing PA pressure.  3. The mitral valve is normal in structure. Trivial mitral valve regurgitation. No evidence of mitral stenosis.  4. The aortic valve is tricuspid. Aortic valve regurgitation is mild to moderate. No aortic stenosis is present.  5. Aortic dilatation noted. There is mild dilatation of the aortic root, measuring 41 mm.  6. The inferior vena cava is normal in size with greater than 50% respiratory variability, suggesting right atrial pressure of 3 mmHg. Comparison(s): Prior images reviewed side by side. The left ventricular inferior wall abnormality is old. The apical wall motion abnormality appears to be new. FINDINGS  Left Ventricle: Left ventricular ejection fraction, by estimation, is 50 to 55%. The left ventricle has low normal function. The left ventricle demonstrates regional wall motion abnormalities. Mild hypokinesis of the left ventricular, basal inferoseptal  wall and inferior wall. Mild hypokinesis of the left ventricular, apical inferior wall, septal wall and apical segment. The left ventricular internal cavity size was normal in size. There is mild concentric left ventricular hypertrophy. Left ventricular  diastolic parameters are consistent with Grade I diastolic dysfunction (impaired relaxation). Normal left ventricular filling pressure.  LV Wall Scoring: The apical septal segment, basal inferior segment, apical inferior segment, basal inferoseptal segment, and apex are hypokinetic. The entire anterior wall, entire  lateral wall, anterior septum, mid inferoseptal segment, and mid inferior segment are normal. Right Ventricle: The right ventricular size is normal. No increase in right ventricular wall thickness. Right ventricular systolic function is normal. Tricuspid regurgitation signal is inadequate for assessing PA pressure. Left Atrium: Left atrial size was normal in size. Right Atrium: Right atrial size was normal in size. Pericardium: There is no evidence of pericardial effusion. Mitral Valve: The mitral valve is normal in structure. Trivial mitral valve regurgitation. No evidence of mitral valve stenosis. Tricuspid Valve: The tricuspid valve is normal in structure. Tricuspid  valve regurgitation is trivial. Aortic Valve: The aortic valve is tricuspid. Aortic valve regurgitation is mild to moderate. Aortic regurgitation PHT measures 137 msec. No aortic stenosis is present. Aortic valve mean gradient measures 5.0 mmHg. Aortic valve peak gradient measures 8.1 mmHg. Aortic valve area, by VTI measures 4.02 cm. Pulmonic Valve: The pulmonic valve was normal in structure. Pulmonic valve regurgitation is trivial. No evidence of pulmonic stenosis. Aorta: Aortic dilatation noted. There is mild dilatation of the aortic root, measuring 41 mm. Venous: The inferior vena cava is normal in size with greater than 50% respiratory variability, suggesting right atrial pressure of 3 mmHg. IAS/Shunts: No atrial level shunt detected by color flow Doppler.  LEFT VENTRICLE PLAX 2D LVIDd:         4.90 cm      Diastology LVIDs:         3.90 cm      LV e' medial:    6.42 cm/s LV PW:         1.20 cm      LV E/e' medial:  10.6 LV IVS:        1.30 cm      LV e' lateral:   10.10 cm/s LVOT diam:     2.10 cm      LV E/e' lateral: 6.7 LV SV:         100 LV SV Index:   47 LVOT Area:     3.46 cm  LV Volumes (MOD) LV vol d, MOD A2C: 135.0 ml LV vol d, MOD A4C: 167.0 ml LV vol s, MOD A2C: 62.7 ml LV vol s, MOD A4C: 80.9 ml LV SV MOD A2C:     72.3 ml LV SV MOD  A4C:     167.0 ml LV SV MOD BP:      80.9 ml RIGHT VENTRICLE RV S prime:     11.00 cm/s TAPSE (M-mode): 1.8 cm LEFT ATRIUM             Index        RIGHT ATRIUM           Index LA diam:        3.40 cm 1.59 cm/m   RA Area:     13.50 cm LA Vol (A2C):   39.8 ml 18.62 ml/m  RA Volume:   27.30 ml  12.77 ml/m LA Vol (A4C):   40.4 ml 18.90 ml/m LA Biplane Vol: 40.7 ml 19.04 ml/m  AORTIC VALVE AV Area (Vmax):    3.49 cm AV Area (Vmean):   3.43 cm AV Area (VTI):     4.02 cm AV Vmax:           142.00 cm/s AV Vmean:          103.000 cm/s AV VTI:            0.249 m AV Peak Grad:      8.1 mmHg AV Mean Grad:      5.0 mmHg LVOT Vmax:         143.00 cm/s LVOT Vmean:        102.000 cm/s LVOT VTI:          0.289 m LVOT/AV VTI ratio: 1.16 AI PHT:            137 msec  AORTA Ao Root diam: 4.10 cm Ao Asc diam:  3.90 cm MITRAL VALVE MV Area (PHT): 3.07 cm    SHUNTS MV Decel Time: 247 msec    Systemic VTI:  0.29 m MV E velocity: 68.10 cm/s  Systemic Diam: 2.10 cm MV A velocity: 79.70 cm/s MV E/A ratio:  0.85 Mihai Croitoru MD Electronically signed by Jerel Balding MD Signature Date/Time: 03/20/2024/4:00:50 PM    Final    MR BRAIN WO CONTRAST Result Date: 03/20/2024 EXAM: MRI BRAIN WITHOUT CONTRAST 03/20/2024 09:14:54 AM TECHNIQUE: Multiplanar multisequence MRI of the head/brain was performed without the administration of intravenous contrast. COMPARISON: CT head, CTA, CT perfusion earlier today. Brain MRI 10/13/2023. CLINICAL HISTORY: 59 year old male with acute neuro deficit, stroke suspected, and code stroke presentation this morning. FINDINGS: BRAIN AND VENTRICLES: Diffusion imaging demonstrates normal diffusion posteriorly in the left MCA territory, left parietal lobe cortex and subcortical white matter, with the area mostly facilitated on ADC. A small rim of restricted diffusion is possible (series 2, image 37). Developing encephalomalacia is present in this region. Mild hemosiderin is noted. There is no regional mass  effect. No other convincing diffusion restriction is identified. Widely scattered cerebral white matter T2 and FLAIR hyperintensity, chronic left lateral temporal lobe encephalomalacia, and comparatively mild T2 heterogeneity in the pons are stable. Deep gray nuclei also relatively spared. Chronic cerebral blood products including in the left occipital lobe, cerebellar vermis, appear stable. No mass. No midline shift. No hydrocephalus. The sella is unremarkable. Major vascular flow voids are stable. ORBITS: No significant abnormality. SINUSES AND MASTOIDS: Paranasal sinus mucosal thickening and opacification again noted. Visible mastoids and internal auditory structures appear stable and negative. BONES AND SOFT TISSUES: Normal marrow signal. No soft tissue abnormality. IMPRESSION: 1. Mostly subacute and chronic area of cortex and white matter ischemia in the left parietal lobe, but possible small volume diffusion restriction there. No malignant hemorrhagic transformation or mass effect. 2. No other acute intracranial abnormality. Chronic small vessel disease, possibly posttraumatic left temporal lobe encephalomalacia otherwise stable. Electronically signed by: Helayne Hurst MD 03/20/2024 09:29 AM EST RP Workstation: [id number]   CT CEREBRAL PERFUSION W CONTRAST Result Date: 03/20/2024 EXAM: CT BRAIN PERFUSION 03/20/2024 07:10:38 AM TECHNIQUE: Cerebral perfusion analysis using computed tomography with contrast administration, including post-processing of parametric maps with determination of cerebral blood flow, cerebral blood volume, mean transit time and time-to-maximum. Automated exposure control, iterative reconstruction, and/or weight based adjustment of the mA/kV was utilized to reduce the radiation dose to as low as reasonably achievable. 40 mL of iohexol  (OMNIPAQUE ) 350 MG/ML injection was administered. COMPARISON: None available. CLINICAL HISTORY: 59 year old male with stroke presentation today. Abnormal  intracranial arteries on Computed Tomography Angiography. FINDINGS: CT PERFUSION: EXAM QUALITY: The examination is adequate with diagnostic perfusion maps. No significant motion artifact. Appropriate arterial inflow and venous outflow curves. CORE INFARCT (CBF<30% volume): 0 mL. No cerebral blood flow (CBF) or cerebral blood volume (CBV) parameter abnormality detected. TOTAL HYPOPERFUSION (Tmax>6s volume): 12 mL. However, symmetrically affecting both inferior frontal gyri and likely to be artifactual. PENUMBRA: Mismatch volume: probably zero as above. Mismatch ratio: Not applicable Location: Not applicable IMPRESSION: 1. Negative CT perfusion aside from small and symmetric volume of oligemia in the inferior frontal gyri - felt likely to be artifactual. 2. CTA today reported separately. Electronically signed by: Helayne Hurst MD 03/20/2024 07:30 AM EST RP Workstation: [id number]   CT Angio Chest/Abd/Pel for Dissection W and/or Wo Contrast Result Date: 03/20/2024 CLINICAL DATA:  Right arm numbness and chest pain. Clinical concern for acute aortic syndrome. EXAM: CT ANGIOGRAPHY CHEST, ABDOMEN AND PELVIS TECHNIQUE: Non-contrast CT of the chest was initially obtained. Multidetector CT imaging through the chest, abdomen and pelvis was performed  using the standard protocol during bolus administration of intravenous contrast. Multiplanar reconstructed images and MIPs were obtained and reviewed to evaluate the vascular anatomy. RADIATION DOSE REDUCTION: This exam was performed according to the departmental dose-optimization program which includes automated exposure control, adjustment of the mA and/or kV according to patient size and/or use of iterative reconstruction technique. CONTRAST:  OMNIPAQUE  IOHEXOL  350 MG/ML SOLN COMPARISON:  10/13/2023 FINDINGS: CTA CHEST FINDINGS Cardiovascular: Pre contrast imaging shows no hyperdense crescent in the wall of the thoracic aorta to suggest the presence of an acute intramural  hematoma. Ascending thoracic aorta again measures 4.7 cm at the level of the sinuses of Valsalva. Ascending thoracic aorta measures 4 cm diameter as before. No dissection of the thoracic aorta. Status post CABG. No large central pulmonary embolus in the main or lobar pulmonary arteries. Mediastinum/Nodes: No mediastinal lymphadenopathy. 3.4 x 1.5 cm soft tissue density in the retrosternal mediastinum is similar to prior not substantially changed when measured at the same level today as on the previous exam. There is no hilar lymphadenopathy. The esophagus has normal imaging features. There is no axillary lymphadenopathy. Lungs/Pleura: The lungs are clear without focal pneumonia, edema, pneumothorax or pleural effusion. Musculoskeletal: No worrisome lytic or sclerotic osseous abnormality. Stable midthoracic superior vertebral endplate compression deformity. Review of the MIP images confirms the above findings. CTA ABDOMEN AND PELVIS FINDINGS VASCULAR Aorta: Infrarenal abdominal aorta measures 3.1 cm maximum diameter today compared to 3.0 cm previously. Mild-to-moderate atherosclerotic calcification noted without stenosis. Celiac: Patent without evidence of aneurysm, dissection, vasculitis or significant stenosis. SMA: Patent without evidence of aneurysm, dissection, vasculitis or significant stenosis. Renals: Both renal arteries are patent without evidence of aneurysm, dissection, vasculitis, fibromuscular dysplasia or significant stenosis. IMA: Patent without evidence of aneurysm, dissection, vasculitis or significant stenosis. Inflow: Common iliac arteries patent. External iliac arteries patent. 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 Veins: No obvious venous abnormality within the limitations of this arterial phase study. Review of the MIP images confirms the above findings. NON-VASCULAR Hepatobiliary: No suspicious focal abnormality within the  liver parenchyma. Gallbladder is decompressed. No intrahepatic or extrahepatic biliary dilation. Pancreas: No focal mass lesion. No dilatation of the main duct. No intraparenchymal cyst. No peripancreatic edema. Spleen: No splenomegaly. No suspicious focal mass lesion. Adrenals/Urinary Tract: No adrenal nodule or mass. Kidneys unremarkable. No evidence for hydroureter. The urinary bladder appears normal for the degree of distention. Stomach/Bowel: Stomach is distended with food and fluid. Duodenum is normally positioned as is the ligament of Treitz. No small bowel wall thickening. No small bowel dilatation. The terminal ileum is normal. The appendix is best seen on coronal images and is unremarkable. No gross colonic mass. No colonic wall thickening. Lymphatic: There is no gastrohepatic or hepatoduodenal ligament lymphadenopathy. No retroperitoneal or mesenteric lymphadenopathy. No pelvic sidewall lymphadenopathy. Reproductive: The prostate gland and seminal vesicles are unremarkable. Other: No substantial intraperitoneal free fluid. Musculoskeletal: Cortical lucency right iliac crest is stable in the interval. This lesion was present on the study from 05/24/2020 suggesting benign etiology. Review of the MIP images confirms the above findings. IMPRESSION: 1. No acute findings in the chest, abdomen, or pelvis. Specifically, no evidence for acute intramural hematoma or dissection of the thoracoabdominal aorta. 2. Stable 4.7 cm ascending thoracic aortic aneurysm. Continued annual imaging follow-up recommended. 3. Stable 3.1 cm infrarenal abdominal aortic aneurysm. Recommend follow-up ultrasound every 3 years. 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 4. Stable 3.4 x 1.5 cm soft tissue density in the retrosternal mediastinum. This is likely related to prior CABG and may reflect scar or fluid within a pericardial recess. 5. Stable cortical lucency right  iliac crest. This lesion was present on the study from 05/24/2020 suggesting benign etiology. Electronically Signed   By: Camellia Candle M.D.   On: 03/20/2024 07:25   CT ANGIO HEAD NECK W WO CM Result Date: 03/20/2024 EXAM: CTA HEAD AND NECK WITHOUT AND WITH 03/20/2024 07:00:57 AM TECHNIQUE: CTA of the head and neck was performed without and with the administration of 100 mL iohexol  (OMNIPAQUE ) 350 MG/ML injection. Multiplanar 2D and/or 3D reformatted images are provided for review. Automated exposure control, iterative reconstruction, and/or weight based adjustment of the mA/kV was utilized to reduce the radiation dose to as low as reasonably achievable. Stenosis of the internal carotid arteries measured using NASCET criteria. Dense right subclavian venous contrast streak artifact limits some detail of the proximal great vessels, especially the right CCA origin and proximal right subclavian artery. Suboptimal arterial contrast bolus. COMPARISON: Plain head CT 03/20/2024, previous CTA head and neck 08/14/last year. CLINICAL HISTORY: 59 year old male with acute neuro deficit, stroke suspected, upper extremity numbness, previous left MCA infarct, previous sternotomy, and previous CABG. FINDINGS: CTA NECK: AORTIC ARCH AND ARCH VESSELS: No dissection or arterial injury. No significant stenosis of the brachiocephalic or subclavian arteries, except for proximal left subclavian mild atherosclerosis. CERVICAL CAROTID ARTERIES: Visible right CCA with no atherosclerosis or stenosis. Stable mild proximal right ICA calcified plaque without stenosis. Mild left CCA tortuosity without significant plaque or stenosis. Mild proximal left ICA calcified plaque without stenosis is stable. No dissection, arterial injury, or hemodynamically significant stenosis by NASCET criteria. CERVICAL VERTEBRAL ARTERIES: Late entry of the right vertebral artery into the cervical transverse foramen on series 5 image 241, normal variant. Mildly  dominant right vertebral artery in the neck. Proximal left subclavian and mild left vertebral artery origin atherosclerosis, mild left V1 segment atherosclerosis, no cervical left vertebral artery stenosis. No dissection, arterial injury, or significant stenosis. LUNGS AND MEDIASTINUM: Visible central pulmonary arteries enhancing and grossly patent. Prior CABG. No acute finding in the visible upper chest. SOFT TISSUES: No acute abnormality. BONES: Prior sternotomy.  No acute abnormality. CTA HEAD: ANTERIOR CIRCULATION: Right ICA siphon moderate soft and calcified atherosclerosis. Moderate to severe right anterior genu ICA stenosis (series 5 image 138) appears acute but progressed since August. Left ICA siphon similar soft and calcified atherosclerosis with moderate to severe left petrous and cavernous segment junction stenosis appears stable from August. ICA termini remain patent. Nondominant left ACA A1 segment is stable, normal variation. Normal anterior communicating artery. Mild ACA branch irregularity with no branch occlusion identified. Right MCA branch irregularity is chronic, right MCA branches appear stable without discrete occlusion. Left MCA M1 segment mild to moderate tortuosity and irregularity is chronic. Left MCA bifurcation is patent. However, there is generalized left MCA hypoenhancement now compared to August (series 12 image 24). No discrete emergent large vessel occlusion is identified. No aneurysm. POSTERIOR CIRCULATION: Moderate bilateral distal vertebral artery atherosclerotic appearing irregularity. PICA origins are normal. Moderate left vertebral V4 segment stenosis. Mild right V4 segment stenosis appears stable since last year. Similar mild to moderate basilar artery irregularity, mild basilar artery stenosis is stable. Bilateral PCA branches appear stable with irregularity but no proximal PCA occlusion. SCA and PCA origins remain patent. Diminutive or absent posterior communicating  arteries. No aneurysm. OTHER: Insufficient contrast for evaluation of  intracranial venous structures. IMPRESSION: 1. Generalized left MCA hypoenhancement, but No discrete emergent large vessel occlusion. Possible progressed intracranial atherosclerosis since August (see #2). This was discussed by telephone with Dr. Arora at 0720 hours on 03/20/2024, and he advises the patient also positive for Cocaine. So drug-related left MCA vasospasm is also a consideration. 2. Other moderate and severe widespread intracranial atherosclerosis appears stable aside from possibly progressed and severe atherosclerotic stenosis at the anterior genu of the right ICA siphon. 3. Comparatively mild extracranial atherosclerosis, no significant extracranial stenosis. Electronically signed by: Helayne Hurst MD 03/20/2024 07:24 AM EST RP Workstation: [id number]   CT HEAD CODE STROKE WO CONTRAST (LKW 0-4.5h, LVO 0-24h) Result Date: 03/20/2024 EXAM: CT HEAD WITHOUT CONTRAST 03/20/2024 06:40:00 AM TECHNIQUE: CT of the head was performed without the administration of intravenous contrast. Automated exposure control, iterative reconstruction, and/or weight based adjustment of the mA/kV was utilized to reduce the radiation dose to as low as reasonably achievable. COMPARISON: Brain MRI 10/13/2023, CT head 11/18/2023. CLINICAL HISTORY: 59 year old male. Code stroke presentation, upper extremity numbness. FINDINGS: BRAIN AND VENTRICLES: No acute hemorrhage. No evidence of acute infarct. No hydrocephalus. No extra-axial collection. No mass effect or midline shift. Stable brain volume. Chronic left lateral temporal lobe encephalomalacia, thought to be posttraumatic based on previous MRI. Addressed but nonacute appearing cortical and subcortical white matter encephalomalacia in the superior left perirolandic area (series 3 image 23). Stable gray white differentiation elsewhere. Patchy chronic white matter hypodensity in both hemispheres. No suspicious  intracranial vascular hyperdensity. Calcified atherosclerosis at the skull base. ORBITS: No acute abnormality. SINUSES: Rest of the sphenoid paranasal sinus opacification, mucosal thickening. Tympanic cavities and mastoids are well aerated. SOFT TISSUES AND SKULL: Benign left occipital convexity scalp lipoma incidentally noted and stable. No acute soft tissue abnormality. No skull fracture. ALBERTA STROKE PROGRAM EARLY CT SCORE (ASPECTS): Ganglionic (caudate, IC, lentiform nucleus, insula, M1-M3): 7. Supraganglionic (M4-M6): 3. Total: 10. IMPRESSION: 1. Posterior left MCA territory ischemia appears progressed since September but non-acute by CT. No acute intracranial abnormality, ASPECTS 10. 2. These results were communicated to Dr. Arora at 0701 hours on 03/20/2024 by text page via the [facility] messaging system. Electronically signed by: Helayne Hurst MD 03/20/2024 07:02 AM EST RP Workstation: [id number]     PHYSICAL EXAM  Temp:  [97.7 F (36.5 C)-98.7 F (37.1 C)] 97.7 F (36.5 C) (01/21 1509) Pulse Rate:  [47-76] 70 (01/21 1509) Resp:  [9-18] 17 (01/21 1509) BP: (114-163)/(77-101) 163/94 (01/21 1509) SpO2:  [99 %-100 %] 100 % (01/21 1509)  General - Well nourished, well developed, complaining of chest discomfort and R arm/shoulder pain and numb.  Ophthalmologic - fundi not visualized due to noncooperation.  Cardiovascular - Regular rhythm and rate.  Neuro - awake, alert, eyes open, orientated to age, place, time. No aphasia, fluent language, following all simple commands. Able to name and repeat. No gaze palsy, tracking bilaterally, visual field full. Subtle R nasolabial fold falttening. Tongue midline. Bilateral UEs 5/5, no drift. Bilaterally LEs 5/5, no drift. Sensation decreased on the R face and R arm, otherwise symmetrical.  However frontal midline sensory split by tuning fork, b/l FTN intact, gait not tested.     ASSESSMENT/PLAN Mr. HILLARD GOODWINE is a 59 y.o. male with history of  PVD, CAD/MI status post CABG, smoker, hyperlipidemia, cocaine use, stroke admitted for chest discomfort, right arm tingling and numbness. No TNK given due to outside window.    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 CT head and neck right ICA siphon severe stenosis, Fuhs severe intracranial atherosclerosis.  Left MCA small caliber MRI no acute infarct 2D Echo EF 50 to 55% with regional wall motion abnormality LDL 167 HgbA1c 5.6 UDS negative Xarelto  for VTE prophylaxis aspirin  81 mg daily and Xarelto  (rivaroxaban ) daily prior to admission, now on home aspirin  81 mg daily and Xarelto  (rivaroxaban ) daily.  Patient counseled to be compliant with his antithrombotic medications Ongoing aggressive stroke risk factor management Therapy recommendations: None Disposition: Pending  Chest discomfort History of CAD/MI status post CABG This admission reported chest pain and tightness Status post nitroglycerin  x 2 2D echo showed regional wall motion abnormality Cardiology on board Pending further workup  History of stroke 09/2023 admitted for left frontal cortical pontine infarcts.  CTA head and neck left M2 occlusion.  EF 55 to 50%.  LDL 193, A1c 5.4.  UDS positive for cocaine.  Discharged on aspirin  81, cilostazol  and Xarelto  as well as Lipitor .  Hypertension Stable On Coreg  3.125 Await low BP Long term BP goal normotensive  Hyperlipidemia Home meds: None LDL 167, goal < 70 Now on Lipitor  80 Continue statin at discharge  Substance abuse Smoker No substance for last 1 week Currently in the drug rehab program for a week  Other Stroke Risk Factors Advanced age PVD  Other Active Problems   Hospital day # 0  Neurology will sign off. Please call with questions. Pt will follow up with stroke clinic NP at [facility] in about 4 weeks. Thanks for the consult.   Ary Cummins, MD PhD Stroke  Neurology 03/21/2024 5:08 PM    To contact Stroke Continuity provider, please refer to [email or web address]. After hours, contact General Neurology

## 2024-03-21 NOTE — Progress Notes (Signed)
 Pt can return to residence once medically ready. Facility staff can provide transportation if available at time of dc. If facility staff not available, pt can be provided taxi voucher. Facility will need to be notified of pt return prior to dc.

## 2024-03-21 NOTE — Evaluation (Signed)
 Physical Therapy Brief Evaluation and Discharge Note Patient Details Name: Benjamin Espinoza MRN: [id number] DOB: 02-17-1966 Today's Date: 03/21/2024   History of Present Illness  Benjamin Espinoza is a 59 y/o Male admitted from a drug rehab program 03/20/24 due to right shoulder pain/tingling, chest pressure (similar symptoms to his CVA in 8/25). MRI revealed Mostly subacute and chronic area of cortex and white matter ischemia in the left parietal lobe, but possible small volume diffusion restriction there. 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 Pt plans to return to [facility] to continue his drug rehab. There he is on a top bunk and reports he is able to climb up/down to bunk but it is difficult. Recommended to him that he request a bottom bunk.        PT Assessment Patient does not need any further PT services  Assistance Needed at Discharge  None    Equipment Recommendations None recommended by PT  Recommendations for Other Services       Precautions/Restrictions Precautions Precautions: Fall Recall of Precautions/Restrictions: Intact Restrictions Weight Bearing Restrictions Per Provider Order: No        Mobility  Bed Mobility   Supine/Sidelying to sit: Modified independent (Device/Increased time) Sit to supine/sidelying: Modified independent (Device/Increased time)    Transfers Overall transfer level: Modified independent Equipment used: Straight cane                    Ambulation/Gait Ambulation/Gait assistance: Modified independent (Device/Increase time) Gait Distance (Feet): 200 Feet Assistive device: Straight cane Gait Pattern/deviations: Step-through pattern, Decreased stride length,  Drifts right/left Gait Speed: Below normal General Gait Details: Pt able to self correct any instability noted.  Home Activity Instructions    Stairs            Modified Rankin (Stroke Patients Only)        Balance Overall balance assessment: Needs assistance Sitting-balance support: No upper extremity supported Sitting balance-Leahy Scale: Normal     Standing balance support: No upper extremity supported, During functional activity, Single extremity supported Standing balance-Leahy Scale: Good            Pertinent Vitals/Pain PT - Brief Vital Signs All Vital Signs Stable: Yes Pain Assessment Pain Assessment: No/denies pain     Home Living Family/patient expects to be discharged to:: Other (Comment)             Additional Comments: Pt at residential drug rehab program at [facility]    Prior Function Level of Independence: Independent with assistive device(s) Comments: Uses cane    UE/LE Assessment   UE ROM/Strength/Tone/Coordination:  (defer to OT)    LE ROM/Strength/Tone/Coordination: [facility]      Communication   Communication Communication: No apparent difficulties     Cognition Overall Cognitive Status: Appears within functional limits for tasks assessed/performed       General Comments      Exercises     Assessment/Plan    PT Problem List         PT Visit Diagnosis Other abnormalities of gait and mobility (R26.89)    No Skilled PT     Co-evaluation                AMPAC 6  Clicks Help needed turning from your back to your side while in a flat bed without using bedrails?: None Help needed moving from lying on your back to sitting on the side of a flat bed without using bedrails?: None Help needed moving to and from a bed to a chair (including a wheelchair)?: None Help needed standing up from a chair using your arms (e.g., wheelchair or bedside chair)?: None Help needed to walk in hospital room?: None Help needed  climbing 3-5 steps with a railing? : None 6 Click Score: 24      End of Session   Activity Tolerance: Patient tolerated treatment well Patient left: in bed;with call bell/phone within reach   PT Visit Diagnosis: Other abnormalities of gait and mobility (R26.89)     Time: 9043-8994 PT Time Calculation (min) (ACUTE ONLY): 9 min  Charges:   PT Evaluation $PT Eval Low Complexity: 1 Low      [facility] PT Acute Rehabilitation Services Office [phone]'  [place] ORN [facility]  03/21/2024, 10:28 AM

## 2024-03-21 NOTE — Care Management Obs Status (Signed)
 MEDICARE OBSERVATION STATUS NOTIFICATION   Patient Details  Name: Benjamin Espinoza MRN: [id number] Date of Birth: 04-24-1965   Medicare Observation Status Notification Given:  Yes    Debarah Saunas, RN 03/21/2024, 9:22 AM

## 2024-03-21 NOTE — Consult Note (Signed)
 "  Cardiology Consultation   Patient ID: Benjamin Espinoza MRN: [id number]; DOB: 1965-11-20  Admit date: 03/20/2024 Date of Consult: 03/21/2024  PCP:  [name], [name], DO   [name] Cardiologist:  Benjamin VEAR Ren Donley, MD        Patient Profile: Benjamin Espinoza is a 59 y.o. male with a hx of CAD, HFmrEF, Hx of CVA, PAD s/p R fem-pop, TAA, tobacco use, marijuana use, cocaine use, hypertension, hyperlipidemia, and chronic pain who is being seen 03/21/2024 for the evaluation of chest pain at the request of Benjamin Dauphin MD.  History of Present Illness: Benjamin Espinoza has an extensive CAD history with CABG in 2017 in [place]  followed by PCI of SVG-RPDA in 2021, with follow up cath in 2022 showing occlusion of the SVG-RPDA. Most recent [facility] 05/2023 showing severe native disease, patent LIMA-mid LAD with CTO to distal LAD, patent SVG-OM3, patent SVG-OM1 with 80% proximal stenosis, occluded SVG-RPDA. Most recent echocardiogram was obtained during an admission for CVA [ punctuate acute cortical infarct] which showed LVEF 50-55% with mild LVH. RV nl. Mild MR. Mild AR. Moderate dilation of the ascending aorta ~8mm.  Followed up with Lum Benjamin Espinoza 11/2023 and at that time was reporting symptoms concerning for heart burn, however given his CAD history his anti-anginals were further titrated. Patient at that time reported he has stopped using cocaine, though had been using his girlfriend's oxycodone  for his chronic pain.  Of note patient has a history of a R fem-pop bypass, TAA [ CTA 09/2023 showed aorta measures 4.7 cm at the level of the sinuses of valsalva and 3.7 cm at the level of the sinotubular junction, ascending aortic size of 4.0 cm ], and infrarenal abdominal aortic dilation; he follows with vascular surgery outpatient. He is on xarelto  as well as ASA for his peripheral artery disease.   Recent admission 01/2024 for critical leg ischemia. He did undergo RT lower extremity  angiogram with mechanical thrombectomy of the R fem-pop bypass. The graft remained occluded therefore EKOS catheter was left overnight for thrombolytic therapy with bypass lysis the following day. He did develop a small hematoma that did not require surgical intervention. He was continued on his home medications.  Presented to the ED 1/20 for right arm numbness. Code stroke was called. Head imaging showed subacute/chronic ischemia to left parietal lobe with no evidence of acute pathology. Neurology recommended a full stroke work-up. During admission patient reported intermittent chest pain, cardiology was consulted.   Pertinent work up:  ECG: Sinus rhythm, TWI in I and avL, early repolarization VR 80 [similar to previous] UDS this admission negative, patient has entered a drug rehab program.  Echo showed LVEF 50-55% with RWMA [mild hypokinesis of the left ventricular, basal inferoseptal, and inferior wall], G1 DD. RV nl. Report comparison stating inferior wall hypokinesis was on prior echo, though apical wall motion is new.   Lab work: Troponin 25 -> 25 UDS negative LDL 167  Patient has has multiple doses of SL NG and oxycodone  this admission. On chart review patient taking home SL NG in house for chest pain, so unclear how much he has taken.  On interview, patient shared last cocaine and tobacco use was one week ago prior to joining his rehab facility. He is eager to return to help him fully engage in the program to ensure success.  Reporting chest pain described as a heaviness, relieved some with NG though returns. Has been ongoing all day. Prior to this admission  has had anginal chest pain. Does have associated shortness of breath with chest pain.  Reports he has had a lot of medication changes lately. Did continue his asa and xarelto  though had stopped his lipitor , cilostazol , imdur  and ranexa  as he was instructed.  Currently has a headache   Past Medical History:  Diagnosis Date    Abnormal stress test    Acute left ankle pain 03/23/2023   Anxiety    Asthma    as a child   Atypical chest pain 05/07/2017   Chest pain  Unstable angina 06/15/2023   Chronic bilateral low back pain with left-sided sciatica 01/11/2023   Chronic health problem 06/15/2023   Chronic pain syndrome 03/29/2023   Cocaine abuse (HCC) 06/16/2023   Coronary artery disease    DDD (degenerative disc disease), lumbar 11/08/2019   Formatting of this note might be different from the original. 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 There is mild dilatation of the ascending aorta, measuring 43 mm. There is mild dilatation of the aortic root, measuring 44 mm.      Encounter for medication monitoring 01/10/2023   Encounter for well adult exam with abnormal findings 04/08/2023   Essential hypertension    Heart failure with mid-range ejection fraction (HFmEF) (HCC) 06/17/2023   06/16/23 transthoracic echocardiogram: Left Ventricle: Left ventricular ejection fraction 45 to 50%. The left ventricle has mildly decreased function. No left ventricular hypertrophy. 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 [place]    Hyperlipidemia    Impaired mobility and ADLs 03/23/2023   Ischemic cardiomyopathy 12/17/2020   Lumbar radiculopathy 07/05/2023   Myocardial infarction [facility]) 2017   NSTEMI (non-ST elevated myocardial infarction) (HCC) 11/28/2020   OAB (overactive bladder) 01/11/2023   PAD (peripheral artery disease) 09/21/2022   PVD (peripheral vascular disease) 09/21/2019   Right leg pain 03/23/2023   Shortness of breath    Sinus bradycardia 11/29/2019   Tobacco use disorder    Unhoused person 06/16/2023   Unstable angina (HCC) 06/15/2023    Past Surgical History:  Procedure Laterality Date   ABDOMINAL AORTOGRAM W/LOWER EXTREMITY Bilateral 09/21/2019   Procedure: ABDOMINAL AORTOGRAM W/LOWER EXTREMITY;   Surgeon: Eliza Lonni RAMAN, MD;  Location: [facility] INVASIVE CV LAB;  Service: Cardiovascular;  Laterality: Bilateral;   ABDOMINAL AORTOGRAM W/LOWER EXTREMITY N/A 12/07/2019   Procedure: ABDOMINAL AORTOGRAM W/LOWER EXTREMITY;  Surgeon: Eliza Lonni RAMAN, MD;  Location: [facility] INVASIVE CV LAB;  Service: Cardiovascular;  Laterality: N/A;   ABDOMINAL AORTOGRAM W/LOWER EXTREMITY N/A 09/09/2022   Procedure: ABDOMINAL AORTOGRAM W/LOWER EXTREMITY;  Surgeon: Gretta Lonni PARAS, MD;  Location: [facility] INVASIVE CV LAB;  Service: Vascular;  Laterality: N/A;   ABDOMINAL AORTOGRAM W/LOWER EXTREMITY N/A 01/31/2024   Procedure: ABDOMINAL AORTOGRAM W/LOWER EXTREMITY;  Surgeon: Serene Gaile ORN, MD;  Location: [facility] INVASIVE CV LAB;  Service: Cardiovascular;  Laterality: N/A;   CARDIAC CATHETERIZATION     COLONOSCOPY N/A 11/19/2023   Procedure: COLONOSCOPY;  Surgeon: Stacia Glendia BRAVO, MD;  Location: [facility] ENDOSCOPY;  Service: Gastroenterology;  Laterality: N/A;   CORONARY ARTERY BYPASS GRAFT     quadruple bypas 2017   CORONARY STENT INTERVENTION N/A 11/26/2019   Procedure: CORONARY STENT INTERVENTION;  Surgeon: Mady Lonni, MD;  Location: [facility] INVASIVE CV LAB;  Service: Cardiovascular;  Laterality: N/A;   ESOPHAGOGASTRODUODENOSCOPY N/A 11/19/2023   Procedure: EGD (ESOPHAGOGASTRODUODENOSCOPY);  Surgeon: Stacia Glendia BRAVO, MD;  Location: [facility] ENDOSCOPY;  Service: Gastroenterology;  Laterality: N/A;   FEMORAL-POPLITEAL BYPASS GRAFT Right 12/25/2019   Procedure: BYPASS GRAFT FEMORAL- BELOW KNEE POPLITEAL ARTERY RIGHT USING GORE PROPATEN VASCULAR GRAFT;  Surgeon: Eliza Lonni RAMAN, MD;  Location: [facility] OR;  Service: Vascular;  Laterality: Right;   FEMORAL-POPLITEAL BYPASS GRAFT Right 09/21/2022   Procedure: REDO RIGHT LOWER EXTREMITY BYPASS USING GORE PROPATEN 6mm REMOVALBLE RING GRAFT;  Surgeon: Eliza Lonni RAMAN, MD;  Location: [facility] OR;  Service: Vascular;  Laterality: Right;   INTRAOPERATIVE ARTERIOGRAM Right  09/21/2022   Procedure: INTRA OPERATIVE ARTERIOGRAM;  Surgeon: Eliza Lonni RAMAN, MD;  Location: [facility] OR;  Service: Vascular;  Laterality: Right;   LEFT HEART CATH AND CORS/GRAFTS ANGIOGRAPHY N/A 11/26/2019   Procedure: LEFT HEART CATH AND CORS/GRAFTS ANGIOGRAPHY;  Surgeon: Mady Lonni, MD;  Location: [facility] INVASIVE CV LAB;  Service: Cardiovascular;  Laterality: N/A;   LEFT HEART CATH AND CORS/GRAFTS ANGIOGRAPHY N/A 12/01/2020   Procedure: LEFT HEART CATH AND CORS/GRAFTS ANGIOGRAPHY;  Surgeon: Burnard Debby LABOR, MD;  Location: [facility] INVASIVE CV LAB;  Service: Cardiovascular;  Laterality: N/A;   LEFT HEART CATH AND CORS/GRAFTS ANGIOGRAPHY N/A 06/16/2023   Procedure: LEFT HEART CATH AND CORS/GRAFTS ANGIOGRAPHY;  Surgeon: Jordan, Peter M, MD;  Location: [facility] INVASIVE CV LAB;  Service: Cardiovascular;  Laterality: N/A;   LOWER EXTREMITY ANGIOGRAM Right 12/25/2019   Procedure: RIGHT LOWER EXTREMITY ANGIOGRAM;  Surgeon: Eliza Lonni RAMAN, MD;  Location: [facility] OR;  Service: Vascular;  Laterality: Right;   LOWER EXTREMITY INTERVENTION Right 02/01/2024   Procedure: LOWER EXTREMITY INTERVENTION;  Surgeon: Lanis Fonda BRAVO, MD;  Location: [facility] INVASIVE CV LAB;  Service: Cardiovascular;  Laterality: Right;   PERIPHERAL VASCULAR BALLOON ANGIOPLASTY Left 12/07/2019   Procedure: PERIPHERAL VASCULAR BALLOON ANGIOPLASTY;  Surgeon: Eliza Lonni RAMAN, MD;  Location: [facility] INVASIVE CV LAB;  Service: Cardiovascular;  Laterality: Left;  SFA   PERIPHERAL VASCULAR THROMBECTOMY  01/31/2024   Procedure: PERIPHERAL VASCULAR THROMBECTOMY;  Surgeon: Serene Gaile ORN, MD;  Location: [facility] INVASIVE CV LAB;  Service: Cardiovascular;;   PERIPHERAL VASCULAR THROMBECTOMY N/A 02/01/2024   Procedure: PERIPHERAL VASCULAR THROMBECTOMY;  Surgeon: Lanis Fonda BRAVO, MD;  Location: [facility] INVASIVE CV LAB;  Service: Cardiovascular;  Laterality: N/A;       Scheduled Meds:  aspirin  EC  81 mg Oral Daily   atorvastatin   80 mg Oral Daily   carvedilol    3.125 mg Oral BID WC   diclofenac  Sodium  4 g Topical QID   lidocaine   1 patch Transdermal Q24H   nicotine   21 mg Transdermal Daily   rivaroxaban   20 mg Oral Q supper   Continuous Infusions:  PRN Meds: acetaminophen , gabapentin , nitroGLYCERIN , oxyCODONE   Allergies:   Allergies[1]  Social History:   Social History   Socioeconomic History   Marital status: Married    Spouse name: Not on file   Number of children: Not on file   Years of education: Not on file   Highest education level: Not on file  Occupational History   Not on file  Tobacco Use   Smoking status: Every Day    Types: Cigars, Cigarettes    Passive exposure: Current (Wife is a smoker)   Smokeless tobacco: Never   Tobacco comments:    as of 12/20/19 2 cigararettes/day  Vaping Use   Vaping status: Never Used  Substance and Sexual Activity   Alcohol use: No   Drug use: Yes    Types: Marijuana, Cocaine   Sexual activity: Not Currently  Other Topics Concern  Not on file  Social History Narrative   Lives with alone but has a caregiver   Social Drivers of Health   Tobacco Use: High Risk (03/20/2024)   Patient History    Smoking Tobacco Use: Every Day    Smokeless Tobacco Use: Never    Passive Exposure: Current  Financial Resource Strain: High Risk (06/09/2023)   Overall Financial Resource Strain (CARDIA)    Difficulty of Paying Living Expenses: Very hard  Food Insecurity: No Food Insecurity (03/06/2024)   Epic    Worried About Programme Researcher, Broadcasting/film/video in the Last Year: Never true    Ran Out of Food in the Last Year: Never true  Recent Concern: Food Insecurity - Food Insecurity Present (02/01/2024)   Epic    Worried About Programme Researcher, Broadcasting/film/video in the Last Year: Sometimes true    [employer] of Food in the Last Year: Sometimes true  Transportation Needs: Unmet Transportation Needs (02/01/2024)   Epic    Lack of Transportation (Medical): Yes    Lack of Transportation (Non-Medical): No  Physical Activity: Inactive  (03/25/2023)   Exercise Vital Sign    Days of Exercise per Week: 0 days    Minutes of Exercise per Session: 0 min  Stress: Stress Concern Present (11/25/2023)   [employer] of Occupational Health - Occupational Stress Questionnaire    Feeling of Stress: To some extent  Social Connections: Socially Integrated (06/16/2023)   Social Connection and Isolation Panel    Frequency of Communication with Friends and Family: More than three times a week    Frequency of Social Gatherings with Friends and Family: Never    Attends Religious Services: More than 4 times per year    Active Member of Clubs or Organizations: Yes    Attends Banker Meetings: Never    Marital Status: Married  Catering Manager Violence: Not At Risk (02/01/2024)   Epic    Fear of Current or Ex-Partner: No    Emotionally Abused: No    Physically Abused: No    Sexually Abused: No  Depression (PHQ2-9): Low Risk (06/09/2023)   Depression (PHQ2-9)    PHQ-2 Score: 0  Recent Concern: Depression (PHQ2-9) - Medium Risk (03/23/2023)   Depression (PHQ2-9)    PHQ-2 Score: 6  Alcohol Screen: Low Risk (03/25/2023)   Alcohol Screen    Last Alcohol Screening Score (AUDIT): 0  Housing: Low Risk (02/01/2024)   Epic    Unable to Pay for Housing in the Last Year: No    Number of Times Moved in the Last Year: 1    Homeless in the Last Year: No  Utilities: Not At Risk (02/01/2024)   Epic    Threatened with loss of utilities: No  Health Literacy: Adequate Health Literacy (03/25/2023)   B1300 Health Literacy    Frequency of need for help with medical instructions: Never    Family History:   Family History  Problem Relation Age of Onset   Heart disease Mother    Heart disease Father    Heart disease Sister      ROS:  Please see the history of present illness.  All other ROS reviewed and negative.     Physical Exam/Data: Vitals:   03/21/24 0915 03/21/24 1045 03/21/24 1145 03/21/24 1250  BP:  116/82 128/81 (!)  134/101  Pulse:   66 72  Resp:  12 11 18   Temp:    98.5 F (36.9 C)  TempSrc:    Oral  SpO2: 100% 100% 100% 99%    Intake/Output Summary (Last 24 hours) at 03/21/2024 1328 Last data filed at 03/20/2024 1822 Gross per 24 hour  Intake 720 ml  Output 430 ml  Net 290 ml      02/08/2024    7:53 PM 02/03/2024    6:18 AM 01/31/2024    6:45 PM  Last 3 Weights  Weight (lbs) 197 lb 184 lb 8.4 oz 185 lb 13.6 oz  Weight (kg) 89.359 kg 83.7 kg 84.3 kg     There is no height or weight on file to calculate BMI.  General:  Well nourished, well developed, in no acute distress HEENT: normal Cardiac:  normal S1, S2; RRR; no murmur  Lungs:  clear to auscultation bilaterally, no wheezing, rhonchi or rales  Ext: no edema Musculoskeletal:  No deformities, BUE and BLE strength normal and equal Skin: warm and dry  Neuro:  CNs 2-12 intact, no focal abnormalities noted Psych:  Normal affect   EKG:  The EKG was personally reviewed and demonstrates:  See HPI, serial ECGs this admission with similar report Telemetry:  Telemetry was personally reviewed and demonstrates:  Patient was not attached to telemetry during interview.   Relevant CV Studies: 05/2023 [facility]   Prox RCA lesion is 90% stenosed.   Prox RCA to Dist RCA lesion is 100% stenosed.   Prox Cx to Mid Cx lesion is 100% stenosed.   Ost LM to Mid LM lesion is 50% stenosed.   Ost LAD to Mid LAD lesion is 100% stenosed.   Dist LAD-1 lesion is 100% stenosed.   Dist LAD-2 lesion is 85% stenosed.   Prox Graft lesion is 20% stenosed.   1st Mrg-1 lesion is 95% stenosed.   1st Mrg-2 lesion is 95% stenosed.   1st Mrg-3 lesion is 95% stenosed.   1st Mrg-4 lesion is 90% stenosed.   Origin to Prox Graft lesion is 80% stenosed.   Origin to Prox Graft lesion is 100% stenosed.   SVG.   LV end diastolic pressure is mildly elevated.   Severe 3 vessel occlusive CAD.  Patent LIMA to the LAD but this only supplies the mid LAD segment. The LAD is occluded  distally (CTO) Patent SVG to the third OM Patent SVG to the first OM. There is 80% stenosis in the proximal SVG. This has progressed since 2022 Known occlusion of SVG to RCA Mildly elevated LVEDP   Plan: recommend resuming guideline directed medical therapy. Patient needs counseling on cessation of tobacco and cocaine abuse. While PCI of the SVG to OM1 could be considered I think this would actually be detrimental given ongoing polysubstance abuse and noncompliance with medical therapy. There is also poor outflow of this graft due to severe disease in the native vessel so long term patency of a stent would be poor.   Echocardiogram 09/2023 IMPRESSIONS     1. Hypokinesis of basal septuml unchanged from previous Echo. Left  ventricular ejection fraction, by estimation, is 50 to 55%. The left  ventricle has low normal function. There is mild left ventricular  hypertrophy. Left ventricular diastolic parameters  were normal.   2. Right ventricular systolic function is normal. The right ventricular  size is normal.   3. The mitral valve is normal in structure. Mild mitral valve  regurgitation.   4. The aortic valve is tricuspid. Aortic valve regurgitation is mild.  Aortic valve sclerosis/calcification is present, without any evidence of  aortic stenosis.   5. Compared to previous  echo, root is slightly bigger. . Aortic  dilatation noted. There is moderate dilatation of the aortic root,  measuring 46 mm. There is mild dilatation of the ascending aorta,  measuring 42 mm.     Laboratory Data: High Sensitivity Troponin:   Recent Labs  Lab 03/20/24 0643 03/20/24 0925 03/20/24 1620  TRNPT 25* 25* 24*      Chemistry Recent Labs  Lab 03/20/24 0643 03/20/24 0648 03/21/24 0149  NA 138 142 135  K 4.1 4.1 4.1  CL 106 106 102  CO2 24  --  25  GLUCOSE 84 82 87  BUN 19 20 21*  CREATININE 1.20 1.30* 1.18  CALCIUM  9.8  --  9.5  GFRNONAA >60  --  >60  ANIONGAP 8  --  8    Recent Labs   Lab 03/20/24 0643  PROT 7.2  ALBUMIN 3.5  AST 20  ALT 13  ALKPHOS 72  BILITOT 0.3   Lipids  Recent Labs  Lab 03/21/24 0149  CHOL 227*  TRIG 55  HDL 49  LDLCALC 167*  CHOLHDL 4.7    Hematology Recent Labs  Lab 03/20/24 0643 03/20/24 0648 03/21/24 0149  WBC 4.5  --  4.6  RBC 4.30  --  4.16*  HGB 12.7* 13.3 12.4*  HCT 39.9 39.0 38.8*  MCV 92.8  --  93.3  MCH 29.5  --  29.8  MCHC 31.8  --  32.0  RDW 15.9*  --  16.2*  PLT 310  --  292    Radiology/Studies:  DG Shoulder Right Result Date: 03/21/2024 CLINICAL DATA:  Right shoulder pain. EXAM: RIGHT SHOULDER - 2+ VIEW COMPARISON:  None Available. FINDINGS: No acute fracture or dislocation. The bones are well mineralized. No arthritic changes. The soft tissues are unremarkable. Median sternotomy wires noted. IMPRESSION: Negative. Electronically Signed   By: Vanetta Chou M.D.   On: 03/21/2024 11:11   ECHOCARDIOGRAM COMPLETE Result Date: 03/20/2024    ECHOCARDIOGRAM REPORT   Patient Name:   JAHREL BORTHWICK Date of Exam: 03/20/2024 Medical Rec #:  [id number]          Height:       73.0 in Accession #:    [id number]         Weight:       197.0 lb Date of Birth:  [birth year]          BSA:          2.138 m Patient Age:    58 years           BP:           149/97 mmHg Patient Gender: M                  HR:           71 bpm. Exam Location:  Inpatient Procedure: 2D Echo, Cardiac Doppler and Color Doppler (Both Spectral and Color            Flow Doppler were utilized during procedure). Indications:    Stroke I63.9  History:        Patient has prior history of Echocardiogram examinations, most                 recent 10/15/2023. Cardiomyopathy, Previous Myocardial Infarction                 and CAD; Signs/Symptoms:Chest Pain.  Sonographer:    Sydnee Wilson RDCS Referring Phys: [id number] NATHAN PICKERING IMPRESSIONS  1.  Left ventricular ejection fraction, by estimation, is 50 to 55%. The left ventricle has low normal function. The left ventricle  demonstrates regional wall motion abnormalities (see scoring diagram/findings for description). There is mild concentric left ventricular hypertrophy. Left ventricular diastolic parameters are consistent with Grade I diastolic dysfunction (impaired relaxation). There is mild hypokinesis of the left ventricular, basal inferoseptal wall and inferior wall. There is mild hypokinesis of the left ventricular, apical inferior wall, septal wall and apical segment.  2. Right ventricular systolic function is normal. The right ventricular size is normal. Tricuspid regurgitation signal is inadequate for assessing PA pressure.  3. The mitral valve is normal in structure. Trivial mitral valve regurgitation. No evidence of mitral stenosis.  4. The aortic valve is tricuspid. Aortic valve regurgitation is mild to moderate. No aortic stenosis is present.  5. Aortic dilatation noted. There is mild dilatation of the aortic root, measuring 41 mm.  6. The inferior vena cava is normal in size with greater than 50% respiratory variability, suggesting right atrial pressure of 3 mmHg. Comparison(s): Prior images reviewed side by side. The left ventricular inferior wall abnormality is Espinoza. The apical wall motion abnormality appears to be new. FINDINGS  Left Ventricle: Left ventricular ejection fraction, by estimation, is 50 to 55%. The left ventricle has low normal function. The left ventricle demonstrates regional wall motion abnormalities. Mild hypokinesis of the left ventricular, basal inferoseptal  wall and inferior wall. Mild hypokinesis of the left ventricular, apical inferior wall, septal wall and apical segment. The left ventricular internal cavity size was normal in size. There is mild concentric left ventricular hypertrophy. Left ventricular  diastolic parameters are consistent with Grade I diastolic dysfunction (impaired relaxation). Normal left ventricular filling pressure.  LV Wall Scoring: The apical septal segment, basal  inferior segment, apical inferior segment, basal inferoseptal segment, and apex are hypokinetic. The entire anterior wall, entire lateral wall, anterior septum, mid inferoseptal segment, and mid inferior segment are normal. Right Ventricle: The right ventricular size is normal. No increase in right ventricular wall thickness. Right ventricular systolic function is normal. Tricuspid regurgitation signal is inadequate for assessing PA pressure. Left Atrium: Left atrial size was normal in size. Right Atrium: Right atrial size was normal in size. Pericardium: There is no evidence of pericardial effusion. Mitral Valve: The mitral valve is normal in structure. Trivial mitral valve regurgitation. No evidence of mitral valve stenosis. Tricuspid Valve: The tricuspid valve is normal in structure. Tricuspid valve regurgitation is trivial. Aortic Valve: The aortic valve is tricuspid. Aortic valve regurgitation is mild to moderate. Aortic regurgitation PHT measures 137 msec. No aortic stenosis is present. Aortic valve mean gradient measures 5.0 mmHg. Aortic valve peak gradient measures 8.1 mmHg. Aortic valve area, by VTI measures 4.02 cm. Pulmonic Valve: The pulmonic valve was normal in structure. Pulmonic valve regurgitation is trivial. No evidence of pulmonic stenosis. Aorta: Aortic dilatation noted. There is mild dilatation of the aortic root, measuring 41 mm. Venous: The inferior vena cava is normal in size with greater than 50% respiratory variability, suggesting right atrial pressure of 3 mmHg. IAS/Shunts: No atrial level shunt detected by color flow Doppler.  LEFT VENTRICLE PLAX 2D LVIDd:         4.90 cm      Diastology LVIDs:         3.90 cm      LV e' medial:    6.42 cm/s LV PW:         1.20 cm  LV E/e' medial:  10.6 LV IVS:        1.30 cm      LV e' lateral:   10.10 cm/s LVOT diam:     2.10 cm      LV E/e' lateral: 6.7 LV SV:         100 LV SV Index:   47 LVOT Area:     3.46 cm  LV Volumes (MOD) LV vol d, MOD  A2C: 135.0 ml LV vol d, MOD A4C: 167.0 ml LV vol s, MOD A2C: 62.7 ml LV vol s, MOD A4C: 80.9 ml LV SV MOD A2C:     72.3 ml LV SV MOD A4C:     167.0 ml LV SV MOD BP:      80.9 ml RIGHT VENTRICLE RV S prime:     11.00 cm/s TAPSE (M-mode): 1.8 cm LEFT ATRIUM             Index        RIGHT ATRIUM           Index LA diam:        3.40 cm 1.59 cm/m   RA Area:     13.50 cm LA Vol (A2C):   39.8 ml 18.62 ml/m  RA Volume:   27.30 ml  12.77 ml/m LA Vol (A4C):   40.4 ml 18.90 ml/m LA Biplane Vol: 40.7 ml 19.04 ml/m  AORTIC VALVE AV Area (Vmax):    3.49 cm AV Area (Vmean):   3.43 cm AV Area (VTI):     4.02 cm AV Vmax:           142.00 cm/s AV Vmean:          103.000 cm/s AV VTI:            0.249 m AV Peak Grad:      8.1 mmHg AV Mean Grad:      5.0 mmHg LVOT Vmax:         143.00 cm/s LVOT Vmean:        102.000 cm/s LVOT VTI:          0.289 m LVOT/AV VTI ratio: 1.16 AI PHT:            137 msec  AORTA Ao Root diam: 4.10 cm Ao Asc diam:  3.90 cm MITRAL VALVE MV Area (PHT): 3.07 cm    SHUNTS MV Decel Time: 247 msec    Systemic VTI:  0.29 m MV E velocity: 68.10 cm/s  Systemic Diam: 2.10 cm MV A velocity: 79.70 cm/s MV E/A ratio:  0.85 Mihai Croitoru MD Electronically signed by Jerel Balding MD Signature Date/Time: 03/20/2024/4:00:50 PM    Final    MR BRAIN WO CONTRAST Result Date: 03/20/2024 EXAM: MRI BRAIN WITHOUT CONTRAST 03/20/2024 09:14:54 AM TECHNIQUE: Multiplanar multisequence MRI of the head/brain was performed without the administration of intravenous contrast. COMPARISON: CT head, CTA, CT perfusion earlier today. Brain MRI 10/13/2023. CLINICAL HISTORY: 59 year Espinoza male with acute neuro deficit, stroke suspected, and code stroke presentation this morning. FINDINGS: BRAIN AND VENTRICLES: Diffusion imaging demonstrates normal diffusion posteriorly in the left MCA territory, left parietal lobe cortex and subcortical white matter, with the area mostly facilitated on ADC. A small rim of restricted diffusion is possible  (series 2, image 37). Developing encephalomalacia is present in this region. Mild hemosiderin is noted. There is no regional mass effect. No other convincing diffusion restriction is identified. Widely scattered cerebral white matter T2 and FLAIR hyperintensity, chronic left lateral temporal  lobe encephalomalacia, and comparatively mild T2 heterogeneity in the pons are stable. Deep gray nuclei also relatively spared. Chronic cerebral blood products including in the left occipital lobe, cerebellar vermis, appear stable. No mass. No midline shift. No hydrocephalus. The sella is unremarkable. Major vascular flow voids are stable. ORBITS: No significant abnormality. SINUSES AND MASTOIDS: Paranasal sinus mucosal thickening and opacification again noted. Visible mastoids and internal auditory structures appear stable and negative. BONES AND SOFT TISSUES: Normal marrow signal. No soft tissue abnormality. IMPRESSION: 1. Mostly subacute and chronic area of cortex and white matter ischemia in the left parietal lobe, but possible small volume diffusion restriction there. No malignant hemorrhagic transformation or mass effect. 2. No other acute intracranial abnormality. Chronic small vessel disease, possibly posttraumatic left temporal lobe encephalomalacia otherwise stable. Electronically signed by: Helayne Hurst MD 03/20/2024 09:29 AM EST RP Workstation: [id number]   CT CEREBRAL PERFUSION W CONTRAST Result Date: 03/20/2024 EXAM: CT BRAIN PERFUSION 03/20/2024 07:10:38 AM TECHNIQUE: Cerebral perfusion analysis using computed tomography with contrast administration, including post-processing of parametric maps with determination of cerebral blood flow, cerebral blood volume, mean transit time and time-to-maximum. Automated exposure control, iterative reconstruction, and/or weight based adjustment of the mA/kV was utilized to reduce the radiation dose to as low as reasonably achievable. 40 mL of iohexol  (OMNIPAQUE ) 350 MG/ML  injection was administered. COMPARISON: None available. CLINICAL HISTORY: 59 year Espinoza male with stroke presentation today. Abnormal intracranial arteries on Computed Tomography Angiography. FINDINGS: CT PERFUSION: EXAM QUALITY: The examination is adequate with diagnostic perfusion maps. No significant motion artifact. Appropriate arterial inflow and venous outflow curves. CORE INFARCT (CBF<30% volume): 0 mL. No cerebral blood flow (CBF) or cerebral blood volume (CBV) parameter abnormality detected. TOTAL HYPOPERFUSION (Tmax>6s volume): 12 mL. However, symmetrically affecting both inferior frontal gyri and likely to be artifactual. PENUMBRA: Mismatch volume: probably zero as above. Mismatch ratio: Not applicable Location: Not applicable IMPRESSION: 1. Negative CT perfusion aside from small and symmetric volume of oligemia in the inferior frontal gyri - felt likely to be artifactual. 2. CTA today reported separately. Electronically signed by: Helayne Hurst MD 03/20/2024 07:30 AM EST RP Workstation: [id number]   CT Angio Chest/Abd/Pel for Dissection W and/or Wo Contrast Result Date: 03/20/2024 CLINICAL DATA:  Right arm numbness and chest pain. Clinical concern for acute aortic syndrome. EXAM: CT ANGIOGRAPHY CHEST, ABDOMEN AND PELVIS TECHNIQUE: Non-contrast CT of the chest was initially obtained. Multidetector CT imaging through the chest, abdomen and pelvis was performed using the standard protocol during bolus administration of intravenous contrast. Multiplanar reconstructed images and MIPs were obtained and reviewed to evaluate the vascular anatomy. RADIATION DOSE REDUCTION: This exam was performed according to the departmental dose-optimization program which includes automated exposure control, adjustment of the mA and/or kV according to patient size and/or use of iterative reconstruction technique. CONTRAST:  OMNIPAQUE  IOHEXOL  350 MG/ML SOLN COMPARISON:  10/13/2023 FINDINGS: CTA CHEST FINDINGS  Cardiovascular: Pre contrast imaging shows no hyperdense crescent in the wall of the thoracic aorta to suggest the presence of an acute intramural hematoma. Ascending thoracic aorta again measures 4.7 cm at the level of the sinuses of Valsalva. Ascending thoracic aorta measures 4 cm diameter as before. No dissection of the thoracic aorta. Status post CABG. No large central pulmonary embolus in the main or lobar pulmonary arteries. Mediastinum/Nodes: No mediastinal lymphadenopathy. 3.4 x 1.5 cm soft tissue density in the retrosternal mediastinum is similar to prior not substantially changed when measured at the same level today as on the previous exam. There is  no hilar lymphadenopathy. The esophagus has normal imaging features. There is no axillary lymphadenopathy. Lungs/Pleura: The lungs are clear without focal pneumonia, edema, pneumothorax or pleural effusion. Musculoskeletal: No worrisome lytic or sclerotic osseous abnormality. Stable midthoracic superior vertebral endplate compression deformity. Review of the MIP images confirms the above findings. CTA ABDOMEN AND PELVIS FINDINGS VASCULAR Aorta: Infrarenal abdominal aorta measures 3.1 cm maximum diameter today compared to 3.0 cm previously. Mild-to-moderate atherosclerotic calcification noted without stenosis. Celiac: Patent without evidence of aneurysm, dissection, vasculitis or significant stenosis. SMA: Patent without evidence of aneurysm, dissection, vasculitis or significant stenosis. Renals: Both renal arteries are patent without evidence of aneurysm, dissection, vasculitis, fibromuscular dysplasia or significant stenosis. IMA: Patent without evidence of aneurysm, dissection, vasculitis or significant stenosis. Inflow: Common iliac arteries patent. External iliac arteries patent. 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 Veins: No obvious venous abnormality within the limitations of this  arterial phase study. Review of the MIP images confirms the above findings. NON-VASCULAR Hepatobiliary: No suspicious focal abnormality within the liver parenchyma. Gallbladder is decompressed. No intrahepatic or extrahepatic biliary dilation. Pancreas: No focal mass lesion. No dilatation of the main duct. No intraparenchymal cyst. No peripancreatic edema. Spleen: No splenomegaly. No suspicious focal mass lesion. Adrenals/Urinary Tract: No adrenal nodule or mass. Kidneys unremarkable. No evidence for hydroureter. The urinary bladder appears normal for the degree of distention. Stomach/Bowel: Stomach is distended with food and fluid. Duodenum is normally positioned as is the ligament of Treitz. No small bowel wall thickening. No small bowel dilatation. The terminal ileum is normal. The appendix is best seen on coronal images and is unremarkable. No gross colonic mass. No colonic wall thickening. Lymphatic: There is no gastrohepatic or hepatoduodenal ligament lymphadenopathy. No retroperitoneal or mesenteric lymphadenopathy. No pelvic sidewall lymphadenopathy. Reproductive: The prostate gland and seminal vesicles are unremarkable. Other: No substantial intraperitoneal free fluid. Musculoskeletal: Cortical lucency right iliac crest is stable in the interval. This lesion was present on the study from 05/24/2020 suggesting benign etiology. Review of the MIP images confirms the above findings. IMPRESSION: 1. No acute findings in the chest, abdomen, or pelvis. Specifically, no evidence for acute intramural hematoma or dissection of the thoracoabdominal aorta. 2. Stable 4.7 cm ascending thoracic aortic aneurysm. Continued annual imaging follow-up recommended. 3. Stable 3.1 cm infrarenal abdominal aortic aneurysm. Recommend follow-up ultrasound every 3 years. 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 4. Stable 3.4 x 1.5 cm soft tissue density in the retrosternal  mediastinum. This is likely related to prior CABG and may reflect scar or fluid within a pericardial recess. 5. Stable cortical lucency right iliac crest. This lesion was present on the study from 05/24/2020 suggesting benign etiology. Electronically Signed   By: Camellia Candle M.D.   On: 03/20/2024 07:25   CT ANGIO HEAD NECK W WO CM Result Date: 03/20/2024 EXAM: CTA HEAD AND NECK WITHOUT AND WITH 03/20/2024 07:00:57 AM TECHNIQUE: CTA of the head and neck was performed without and with the administration of 100 mL iohexol  (OMNIPAQUE ) 350 MG/ML injection. Multiplanar 2D and/or 3D reformatted images are provided for review. Automated exposure control, iterative reconstruction, and/or weight based adjustment of the mA/kV was utilized to reduce the radiation dose to as low as reasonably achievable. Stenosis of the internal carotid arteries measured using NASCET criteria. Dense right subclavian venous contrast streak artifact limits some detail of the proximal great vessels, especially the right CCA origin and proximal right subclavian  artery. Suboptimal arterial contrast bolus. COMPARISON: Plain head CT 03/20/2024, previous CTA head and neck 08/14/last year. CLINICAL HISTORY: 59 year Espinoza male with acute neuro deficit, stroke suspected, upper extremity numbness, previous left MCA infarct, previous sternotomy, and previous CABG. FINDINGS: CTA NECK: AORTIC ARCH AND ARCH VESSELS: No dissection or arterial injury. No significant stenosis of the brachiocephalic or subclavian arteries, except for proximal left subclavian mild atherosclerosis. CERVICAL CAROTID ARTERIES: Visible right CCA with no atherosclerosis or stenosis. Stable mild proximal right ICA calcified plaque without stenosis. Mild left CCA tortuosity without significant plaque or stenosis. Mild proximal left ICA calcified plaque without stenosis is stable. No dissection, arterial injury, or hemodynamically significant stenosis by NASCET criteria. CERVICAL  VERTEBRAL ARTERIES: Late entry of the right vertebral artery into the cervical transverse foramen on series 5 image 241, normal variant. Mildly dominant right vertebral artery in the neck. Proximal left subclavian and mild left vertebral artery origin atherosclerosis, mild left V1 segment atherosclerosis, no cervical left vertebral artery stenosis. No dissection, arterial injury, or significant stenosis. LUNGS AND MEDIASTINUM: Visible central pulmonary arteries enhancing and grossly patent. Prior CABG. No acute finding in the visible upper chest. SOFT TISSUES: No acute abnormality. BONES: Prior sternotomy.  No acute abnormality. CTA HEAD: ANTERIOR CIRCULATION: Right ICA siphon moderate soft and calcified atherosclerosis. Moderate to severe right anterior genu ICA stenosis (series 5 image 138) appears acute but progressed since August. Left ICA siphon similar soft and calcified atherosclerosis with moderate to severe left petrous and cavernous segment junction stenosis appears stable from August. ICA termini remain patent. Nondominant left ACA A1 segment is stable, normal variation. Normal anterior communicating artery. Mild ACA branch irregularity with no branch occlusion identified. Right MCA branch irregularity is chronic, right MCA branches appear stable without discrete occlusion. Left MCA M1 segment mild to moderate tortuosity and irregularity is chronic. Left MCA bifurcation is patent. However, there is generalized left MCA hypoenhancement now compared to August (series 12 image 24). No discrete emergent large vessel occlusion is identified. No aneurysm. POSTERIOR CIRCULATION: Moderate bilateral distal vertebral artery atherosclerotic appearing irregularity. PICA origins are normal. Moderate left vertebral V4 segment stenosis. Mild right V4 segment stenosis appears stable since last year. Similar mild to moderate basilar artery irregularity, mild basilar artery stenosis is stable. Bilateral PCA branches appear  stable with irregularity but no proximal PCA occlusion. SCA and PCA origins remain patent. Diminutive or absent posterior communicating arteries. No aneurysm. OTHER: Insufficient contrast for evaluation of intracranial venous structures. IMPRESSION: 1. Generalized left MCA hypoenhancement, but No discrete emergent large vessel occlusion. Possible progressed intracranial atherosclerosis since August (see #2). This was discussed by telephone with Dr. Arora at 0720 hours on 03/20/2024, and he advises the patient also positive for Cocaine. So drug-related left MCA vasospasm is also a consideration. 2. Other moderate and severe widespread intracranial atherosclerosis appears stable aside from possibly progressed and severe atherosclerotic stenosis at the anterior genu of the right ICA siphon. 3. Comparatively mild extracranial atherosclerosis, no significant extracranial stenosis. Electronically signed by: Helayne Hurst MD 03/20/2024 07:24 AM EST RP Workstation: [id number]   CT HEAD CODE STROKE WO CONTRAST (LKW 0-4.5h, LVO 0-24h) Result Date: 03/20/2024 EXAM: CT HEAD WITHOUT CONTRAST 03/20/2024 06:40:00 AM TECHNIQUE: CT of the head was performed without the administration of intravenous contrast. Automated exposure control, iterative reconstruction, and/or weight based adjustment of the mA/kV was utilized to reduce the radiation dose to as low as reasonably achievable. COMPARISON: Brain MRI 10/13/2023, CT head 11/18/2023. CLINICAL HISTORY: 59 year Espinoza male. Code stroke presentation,  upper extremity numbness. FINDINGS: BRAIN AND VENTRICLES: No acute hemorrhage. No evidence of acute infarct. No hydrocephalus. No extra-axial collection. No mass effect or midline shift. Stable brain volume. Chronic left lateral temporal lobe encephalomalacia, thought to be posttraumatic based on previous MRI. Addressed but nonacute appearing cortical and subcortical white matter encephalomalacia in the superior left perirolandic area  (series 3 image 23). Stable gray white differentiation elsewhere. Patchy chronic white matter hypodensity in both hemispheres. No suspicious intracranial vascular hyperdensity. Calcified atherosclerosis at the skull base. ORBITS: No acute abnormality. SINUSES: Rest of the sphenoid paranasal sinus opacification, mucosal thickening. Tympanic cavities and mastoids are well aerated. SOFT TISSUES AND SKULL: Benign left occipital convexity scalp lipoma incidentally noted and stable. No acute soft tissue abnormality. No skull fracture. ALBERTA STROKE PROGRAM EARLY CT SCORE (ASPECTS): Ganglionic (caudate, IC, lentiform nucleus, insula, M1-M3): 7. Supraganglionic (M4-M6): 3. Total: 10. IMPRESSION: 1. Posterior left MCA territory ischemia appears progressed since September but non-acute by CT. No acute intracranial abnormality, ASPECTS 10. 2. These results were communicated to Dr. Arora at 0701 hours on 03/20/2024 by text page via the [facility] messaging system. Electronically signed by: Helayne Hurst MD 03/20/2024 07:02 AM EST RP Workstation: [id number]     Assessment and Plan:  CAD Chest Pain S/p CABG in 2017 S/p PCI to SVG-RPDA in 2021 with subsequent CTO in 2022 Most recent LHC listed above, showing extensive obstructive disease. Intervention at that time was deferred given medication non-compliance and ongoing polysubstance use.   Reported anginal chest pain prior to this admission, today has been having chest pain described as heavy relieved briefly by SL NG. Patient does now report a headache, though has had at least 6 SL NG in the past 24 hours. Currently having chest pain.  ECG without acute ischemic changes Troponin flat and minimally elevated indicating demand ischemia   Will plan to optimize medical therapy, appears he has had multiple medication changes recently which could be contributing to increase in chest pain frequency. Would like patient to be able to return to rehab facility to continue to  pursue substance cessation, if successful and still having chest pain consider repeating LHC.  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  Most likely ICM, see CAD above.   Continue coreg  as above Consider starting Cozaar  tomorrow  Hypertension BP: 163/94 Continue medications as above   PAD S/p fem-pop bypass [in 2021 and 2024] with recent mechanical thrombectomy 01/2024 Follows with vascular  Continue xarelto  Continue medications as above  TAA Infrarenal abdominal aortic dilation CT imaging this admission shows stable sizes. Continue to follow with vascular outpatient for surveillance   Per primary Subacute CVA with prior CVA Substance use   Risk Assessment/Risk Scores:       [place]  Heart Association (NYHA) Functional Class NYHA Class II    For questions or updates, please contact [facility] Please consult [email or web address] for contact info under      Signed, Leontine LOISE Salen, PA-C  03/21/2024 1:28 PM     [1] No Known Allergies  "

## 2024-03-21 NOTE — Progress Notes (Signed)
 CSW spoke w/ staff at [facility] and confirmed pt will need to arrive by no later than 6pm. If not pt will need to dc tomorrow morning.

## 2024-03-21 NOTE — ED Notes (Signed)
 Chaplain contacted at patient's request.  Chaplain informed to bring a Bible and that the patient wishes to speak with him/her. Per Chaplain, request will be passed to oncoming Chaplain.

## 2024-03-22 ENCOUNTER — Other Ambulatory Visit (HOSPITAL_COMMUNITY): Payer: Self-pay

## 2024-03-22 DIAGNOSIS — N189 Chronic kidney disease, unspecified: Secondary | ICD-10-CM | POA: Diagnosis not present

## 2024-03-22 DIAGNOSIS — I130 Hypertensive heart and chronic kidney disease with heart failure and stage 1 through stage 4 chronic kidney disease, or unspecified chronic kidney disease: Secondary | ICD-10-CM | POA: Diagnosis not present

## 2024-03-22 DIAGNOSIS — I5020 Unspecified systolic (congestive) heart failure: Secondary | ICD-10-CM | POA: Diagnosis not present

## 2024-03-22 DIAGNOSIS — I639 Cerebral infarction, unspecified: Secondary | ICD-10-CM | POA: Diagnosis not present

## 2024-03-22 LAB — BASIC METABOLIC PANEL WITH GFR
Anion gap: 8 (ref 5–15)
BUN: 23 mg/dL — ABNORMAL HIGH (ref 6–20)
CO2: 27 mmol/L (ref 22–32)
Calcium: 9.6 mg/dL (ref 8.9–10.3)
Chloride: 103 mmol/L (ref 98–111)
Creatinine, Ser: 1.47 mg/dL — ABNORMAL HIGH (ref 0.61–1.24)
GFR, Estimated: 55 mL/min — ABNORMAL LOW
Glucose, Bld: 89 mg/dL (ref 70–99)
Potassium: 4.1 mmol/L (ref 3.5–5.1)
Sodium: 138 mmol/L (ref 135–145)

## 2024-03-22 LAB — CBC
HCT: 37.3 % — ABNORMAL LOW (ref 39.0–52.0)
Hemoglobin: 12 g/dL — ABNORMAL LOW (ref 13.0–17.0)
MCH: 29.6 pg (ref 26.0–34.0)
MCHC: 32.2 g/dL (ref 30.0–36.0)
MCV: 91.9 fL (ref 80.0–100.0)
Platelets: 293 K/uL (ref 150–400)
RBC: 4.06 MIL/uL — ABNORMAL LOW (ref 4.22–5.81)
RDW: 15.9 % — ABNORMAL HIGH (ref 11.5–15.5)
WBC: 5.1 K/uL (ref 4.0–10.5)
nRBC: 0 % (ref 0.0–0.2)

## 2024-03-22 MED ORDER — LIDOCAINE 5 % EX PTCH
1.0000 | MEDICATED_PATCH | Freq: Every day | CUTANEOUS | 0 refills | Status: AC
  Filled 2024-03-22: qty 30, 30d supply, fill #0

## 2024-03-22 MED ORDER — NICOTINE 14 MG/24HR TD PT24
14.0000 mg | MEDICATED_PATCH | Freq: Every day | TRANSDERMAL | 0 refills | Status: AC
  Filled 2024-03-22: qty 28, 28d supply, fill #0

## 2024-03-22 MED ORDER — RANOLAZINE ER 1000 MG PO TB12
1000.0000 mg | ORAL_TABLET | Freq: Two times a day (BID) | ORAL | 3 refills | Status: AC
  Filled 2024-03-22: qty 60, 30d supply, fill #0

## 2024-03-22 MED ORDER — ISOSORBIDE MONONITRATE ER 60 MG PO TB24: 90.0000 mg | ORAL_TABLET | Freq: Every day | ORAL | Status: DC

## 2024-03-22 MED ORDER — ATORVASTATIN CALCIUM 80 MG PO TABS
80.0000 mg | ORAL_TABLET | Freq: Every day | ORAL | 3 refills | Status: AC
  Filled 2024-03-22: qty 90, 90d supply, fill #0

## 2024-03-22 MED ORDER — ISOSORBIDE MONONITRATE ER 60 MG PO TB24
60.0000 mg | ORAL_TABLET | Freq: Every day | ORAL | 5 refills | Status: DC
  Filled 2024-03-22: qty 30, 30d supply, fill #0

## 2024-03-22 MED ORDER — DICLOFENAC SODIUM 1 % EX GEL
4.0000 g | Freq: Four times a day (QID) | CUTANEOUS | 5 refills | Status: AC
  Filled 2024-03-22: qty 100, 30d supply, fill #0

## 2024-03-22 MED ORDER — OXYCODONE HCL 5 MG PO TABS
5.0000 mg | ORAL_TABLET | Freq: Every day | ORAL | 0 refills | Status: AC | PRN
  Filled 2024-03-22: qty 5, 5d supply, fill #0

## 2024-03-22 MED ORDER — CYCLOBENZAPRINE HCL 5 MG PO TABS
5.0000 mg | ORAL_TABLET | Freq: Three times a day (TID) | ORAL | 0 refills | Status: AC | PRN
  Filled 2024-03-22: qty 30, 10d supply, fill #0

## 2024-03-22 MED ORDER — ISOSORBIDE MONONITRATE ER 30 MG PO TB24
90.0000 mg | ORAL_TABLET | Freq: Every day | ORAL | 2 refills | Status: AC
  Filled 2024-03-22: qty 90, 30d supply, fill #0

## 2024-03-22 NOTE — TOC Transition Note (Signed)
 Transition of Care [facility]) - Discharge Note   Patient Details  Name: Benjamin Espinoza MRN: [id number] Date of Birth: Sep 02, 1965  [facility] CM/SW Contact:  Andrez JULIANNA George, RN Phone Number: 03/22/2024, 1:34 PM   Clinical Narrative:     Pt is discharging back to [facility] house. Pt has transportation. No further needs per ICM.  Final next level of care: Other (comment) ([facility]) Barriers to Discharge: No Barriers Identified   Patient Goals and CMS Choice            Discharge Placement                       Discharge Plan and Services Additional resources added to the After Visit Summary for     Discharge Planning Services: CM Consult                                 Social Drivers of Health (SDOH) Interventions SDOH Screenings   Food Insecurity: No Food Insecurity (03/22/2024)  Recent Concern: Food Insecurity - Food Insecurity Present (02/01/2024)  Housing: Low Risk (03/22/2024)  Transportation Needs: No Transportation Needs (03/22/2024)  Recent Concern: Transportation Needs - Unmet Transportation Needs (02/01/2024)  Utilities: Not At Risk (03/22/2024)  Alcohol Screen: Low Risk (03/25/2023)  Depression (PHQ2-9): Low Risk (06/09/2023)  Recent Concern: Depression (PHQ2-9) - Medium Risk (03/23/2023)  Financial Resource Strain: High Risk (06/09/2023)  Physical Activity: Inactive (03/25/2023)  Social Connections: Unknown (03/22/2024)  Stress: Stress Concern Present (11/25/2023)  Tobacco Use: High Risk (03/20/2024)  Health Literacy: Adequate Health Literacy (03/25/2023)     Readmission Risk Interventions     No data to display

## 2024-03-22 NOTE — Progress Notes (Signed)
 "  HD#0 SUBJECTIVE:  Patient Summary: Benjamin Espinoza is a 59 y.o. 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   Overnight Events:  None  Interim History:  Patient sitting up on edge of bed upon entry. No new symptoms overnight. He is still endorsing neck/shoulder pain, but this is improved  slightly from day prior. He feels ready for discharge as long as Cardiology clears him to return.   OBJECTIVE:  Vital Signs: Vitals:   03/22/24 0027 03/22/24 0347 03/22/24 0500 03/22/24 0730  BP: 127/84 120/86  (!) 131/91  Pulse: 72 77  84  Resp: 16 16  19   Temp: 98 F (36.7 C) 98 F (36.7 C)  98.6 F (37 C)  TempSrc: Oral Oral  Oral  SpO2: 98% 99%  99%  Weight:   87 kg    Supplemental O2: Room Air SpO2: 99 %  Filed Weights   03/22/24 0500  Weight: 87 kg     Intake/Output Summary (Last 24 hours) at 03/22/2024 0912 Last data filed at 03/22/2024 9170 Gross per 24 hour  Intake --  Output 1500 ml  Net -1500 ml   Net IO Since Admission: -1,210 mL [03/22/24 0912]  Physical Exam:  Gen: A&Ox4, NAD HEENT: Atraumatic, normocephalic; oropharynx clear, tongue without atrophy or fasciculations. Resp: CTAB, normal work of breathing CV: RRR, extremities appear well-perfused. Abd: soft/NT/ND Extrem: Nml bulk; no cyanosis, clubbing, or edema. Right arm pain with AROM and with good strength   Neuro: *MS: A&O x4. Follows multi-step commands.  *Speech: no dysarthria or aphasia, able to name and repeat. *CN:    I: Deferred   II,III: PERRLA, VFF by confrontation, optic discs not visualized 2/2 pupillary constriction   III,IV,VI: EOMI w/o nystagmus, no ptosis   V: Sensation diminished from V1 to V3 to LT on right  side    VII: Eyelid closure was full.  Smile symmetric.   VIII: Hearing intact to voice   IX,X: Voice normal, palate elevates symmetrically    XI: SCM/trap 5/5 bilat   XII: Tongue protrudes midline, no atrophy or fasciculations  *Motor:   Normal bulk.  No tremor, rigidity or bradykinesia. No pronator drift.    Strength: Dlt Bic Tri WE WrF FgS Gr HF KnF KnE PlF DoF    Left 5 5 5 5 5 5 5 5 5 5 5 5     Right 5 5 5 5 5 5 5 5 5 5 5 5     *Sensory: Intact to light touch, pinprick, temperature vibration throughout. Asymmetric, right diminished compared to left similar to baseline. *Gait: deferred   Skin: warm and dry Psych: mood calm, behavior normal, thought content normal, judgement normal    Patient Lines/Drains/Airways Status     Active Line/Drains/Airways     Name Placement date Placement time Site Days   Peripheral IV 03/20/24 18 G Anterior;Proximal;Right Forearm 03/20/24  0650  Forearm  2            Pertinent labs and imaging:      Latest Ref Rng & Units 03/22/2024    2:12 AM 03/21/2024    1:49 AM 03/20/2024    6:48 AM  CBC  WBC 4.0 - 10.5 K/uL 5.1  4.6  Hemoglobin 13.0 - 17.0 g/dL 87.9  87.5  86.6   Hematocrit 39.0 - 52.0 % 37.3  38.8  39.0   Platelets 150 - 400 K/uL 293  292         Latest Ref Rng & Units 03/22/2024    2:12 AM 03/21/2024    1:49 AM 03/20/2024    6:48 AM  CMP  Glucose 70 - 99 mg/dL 89  87  82   BUN 6 - 20 mg/dL 23  21  20    Creatinine 0.61 - 1.24 mg/dL 8.52  8.81  8.69   Sodium 135 - 145 mmol/L 138  135  142   Potassium 3.5 - 5.1 mmol/L 4.1  4.1  4.1   Chloride 98 - 111 mmol/L 103  102  106   CO2 22 - 32 mmol/L 27  25    Calcium  8.9 - 10.3 mg/dL 9.6  9.5      DG Shoulder Right Result Date: 03/21/2024 CLINICAL DATA:  Right shoulder pain. EXAM: RIGHT SHOULDER - 2+ VIEW COMPARISON:  None Available. FINDINGS: No acute fracture or dislocation. The bones are well mineralized. No arthritic changes. The soft tissues are unremarkable. Median  sternotomy wires noted. IMPRESSION: Negative. Electronically Signed   By: Vanetta Chou M.D.   On: 03/21/2024 11:11    ASSESSMENT/PLAN:  Assessment: Principal Problem:   Stroke [facility]) Active Problems:   Essential hypertension   Hyperlipidemia   PAD (peripheral artery disease)   Heart failure with mid-range ejection fraction (HFmEF) (HCC)   Chronic anticoagulation   History of CVA (cerebrovascular accident)   Coronary artery disease involving coronary bypass graft of native heart with angina pectoris  Benjamin Espinoza is a 59 y.o. 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    Plan: #Subacute left parietal ischemic stroke #Prior CVA Symptoms stable and predominantly sensory in the right upper extremity and right face.  Mild motor weakness in the right lower extremity seem to be chronic and related to his vascular disease.  Adherent on medications including Xarelto  and aspirin  but for an unknown reason was not taking atorvastatin .  LDL of 167, not at goal.  Neurology followed, appreciate assistance. A1c of 5.6. Echo shows unchanged LVEF 50-55%, but with new wall motion abnormalities. Given new wall motion abnormalities and ongoing intermittent chest pain, Cards was consulted.  - Neurology has signed off, appreciate assistance  - Continue home Xarelto  20 mg daily  - Continue home aspirin  81 mg daily - Resume home atorvastatin  80 mg daily - PT/OT/SLP recs: None - Tylenol  1000 mg every 8 hours as needed, topical lidocaine  patch, topical Voltaren  gel for shoulder pain   #Chest pain #CAD s/p CABG #HFmrEF Patient has had intermittent chest pain during hospitalization. Previous LHC 05/2023 w/ severe 3 vessel  occlusive CAD, patent LIMA to LAD (though with distal occlusion), patent SVG to OM3, patent SVG to OM1, 80% stenosis in proximal SVG, known occlusion of SVG to RCA. At this point he was deemed a poor candidate for PCI given his PSUD and noncompliance with medical therapy and low likelihood of long term stent patency. However, repeat Echo shows wall motion abnormalities. He is however recovering from substance use and living in rehab. Consulted  Cardiology for ongoing intermittent chest pain and new findings on Echo. Cardiology recommended resuming Imdur  60 mg, continuing Coreg  and Aspirin , and starting Ranexa  1000 mg BID. They would like to optimize his medical therapy and if still having chest pain while substance free, then consider repeating LHC outpatient.  - Continue home aspirin  81 mg daily - Continue home atorvastatin  80 mg daily - Continue home Carvedilol  3.125 mg BID - Continue Imdur  60 mg daily - Start Ranexa  1000 mg BID - Cardiology consulted, appreciate assistance   #AKI Baseline Cr ~ 1.0 - 1.2. Cr elevated to 1.47 which may be mild AKI vs. elevated Creatinine. Likely pre-renal as patient reports he has not been drinking much water during this hospitalization. He has had contrast imaging which is likely contributing.  - Encourage po intake - Avoid nephrotoxins, NSAIDs - Daily BMP  - Hold Losartan     #Hypertension Amlodipine  and losartan  were held on discharge from the hospital in 01/2024 apparently due to normal to low blood pressures. Resumed Carvedilol  3.125 mg BID and Imdur  60 mg daily. Will hold resuming Losartan  for now given mild Creatinine bump and resume as it improves.  - Continue home Carvedilol  3.125 mg BID - Continue Imdur  60 mg daily - Hold Losartan  iso AKI    #PAD Significant vascular disease in his lower extremities with admission in December 2025 for critical limb ischemia where he underwent right lower extremity thrombectomy and right lower extremity bypass lysis and  relining.  No new or worsening symptoms associated with his lower extremity but he does have baseline decreased sensation and mild weakness in his right lower extremity.  Extremities are warm and well-perfused with slightly diminished but palpable dorsalis pedis pulses bilaterally. - Continue home Xarelto  20 mg daily  - Continue home aspirin  81 mg daily - Resume home atorvastatin  80 mg daily   #Substance Use (cocaine, tobacco) Patient has been long-timer user of cocaine, however last cocaine use was 1 week ago and he then entered drug rehab program at [facility]. UDS on admission negative. Commended patient for efforts of sobriety. Recommend continued smoking cessation counseling outpatient, especially given his vascular disease.  - Nicotine  patches - Continue smoking cessation outpatient  - Return to [facility] for drug rehab upon discharge   Best Practice: Diet: Cardiac diet VTE: Xarelto  Code: Full  Disposition planning: Therapy Recs: None, DME: none DISPO: Anticipated discharge today or tomorrow to [facility] pending Cardiology recs.   Signature:  Doyal Miyamoto, MD [facility]  9:12 AM, 03/22/2024  On Call pager [phone]  "

## 2024-03-22 NOTE — TOC Initial Note (Addendum)
 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  Pt has been at the [facility] house for the last week. He plans to return at d/c.  Pt states [facility] will provide him transportation to appointments while he is there. Pt says his spouse will provide him transportation from the hospital back to [facility] house.  Pt manages his own medications.   No follow up per therapies.  ICM following for further dc needs.   Expected Discharge Plan:  (drug rehab) Barriers to Discharge: Continued Medical Work up   Patient Goals and CMS Choice            Expected Discharge Plan and Services   Discharge Planning Services: CM Consult   Living arrangements for the past 2 months:  (Drug rehab--[facility])                                      Prior Living Arrangements/Services Living arrangements for the past 2 months:  (Drug rehab--[facility]) Lives with:: Facility Resident Patient language and need for interpreter reviewed:: Yes Do you feel safe going back to the place where you live?: Yes          Current home services: DME (cane/ walker) Criminal Activity/Legal Involvement Pertinent to Current Situation/Hospitalization: No - Comment as needed  Activities of Daily Living      Permission Sought/Granted                   Emotional Assessment Appearance:: Appears stated age Attitude/Demeanor/Rapport: Engaged Affect (typically observed): Accepting Orientation: : Oriented to Self, Oriented to Place, Oriented to  Time, Oriented to Situation Alcohol / Substance Use: Illicit Drugs Psych Involvement: No (comment)  Admission diagnosis:  Stroke [facility]) [I63.9] Patient Active Problem List   Diagnosis Date Noted   Stroke [facility]) 03/20/2024   Polysubstance abuse (HCC) 01/31/2024   Coronary artery disease involving coronary bypass graft of native heart with angina pectoris 12/06/2023   Symptomatic anemia 11/18/2023   Chronic anticoagulation 11/18/2023   History of CVA (cerebrovascular accident) 11/18/2023   Gastrointestinal hemorrhage 11/18/2023   Positive fecal occult blood test 11/18/2023   Acute CVA (cerebrovascular accident) (HCC) 10/13/2023   Chronic kidney disease, stage 3a (HCC) 10/13/2023   Aortic dilatation 10/13/2023   Lumbar radiculopathy 07/05/2023   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/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   Heart failure with mid-range ejection fraction (HFmEF) (HCC) 06/17/2023   Cocaine abuse (HCC) 06/16/2023   Unhoused person 06/16/2023   Chest pain  Unstable angina 06/15/2023   Chronic health problem 06/15/2023   Unstable angina (HCC) 06/15/2023   Encounter for well adult exam with abnormal findings 04/08/2023   Chronic pain syndrome 03/29/2023   Acute left ankle pain 03/23/2023  Impaired mobility and ADLs 03/23/2023   Right leg pain 03/23/2023   Chronic bilateral low back pain with left-sided sciatica 01/11/2023   OAB (overactive bladder) 01/11/2023   Encounter for medication monitoring 01/10/2023   PAD (peripheral artery disease) 09/21/2022   Ischemic cardiomyopathy 12/17/2020   NSTEMI (non-ST elevated myocardial infarction) (HCC) 11/28/2020   Anxiety    Asthma    Sinus bradycardia 11/29/2019   Abnormal stress test    DDD (degenerative disc disease), lumbar 11/08/2019   Essential hypertension    Hx  of CABG    Hyperlipidemia    Myocardial infarction [facility])    Tobacco use disorder    PVD (peripheral vascular disease) 09/21/2019   PCP:  [name], [name], DO Pharmacy:   [id number] - [place] ([place]), [place] - [address] [place] [address] Phone: [phone] Fax: [phone]  [id number] - [place], [place] - [address] AT [facility] [address] [facility] [address] [place] [address] Phone: [phone] Fax: [phone]  [facility] [address] [place] [address] Phone: [phone] Fax: [phone]  [place] [facility] [address] [place] [address] Phone: [phone] Fax: [phone]     Social Drivers of Health (SDOH) Social History: SDOH Screenings   Food Insecurity: No Food Insecurity (03/06/2024)  Recent Concern: Food Insecurity - Food Insecurity Present (02/01/2024)  Housing: Low Risk (02/01/2024)  Transportation Needs: Unmet Transportation Needs (02/01/2024)  Utilities: Not At Risk (02/01/2024)  Alcohol Screen: Low Risk (03/25/2023)  Depression (PHQ2-9): Low Risk (06/09/2023)  Recent Concern: Depression (PHQ2-9) - Medium Risk (03/23/2023)  Financial Resource Strain: High Risk (06/09/2023)  Physical Activity: Inactive (03/25/2023)  Social Connections: Socially Integrated (06/16/2023)  Stress: Stress Concern Present (11/25/2023)  Tobacco Use: High Risk (03/20/2024)  Health Literacy: Adequate Health Literacy (03/25/2023)   SDOH Interventions:     Readmission Risk Interventions     No data to display

## 2024-03-22 NOTE — Progress Notes (Signed)
 "  Progress Note  Patient Name: Benjamin Espinoza Date of Encounter: 03/22/2024 [facility] Cardiologist: [name], MD   Interval Summary   Reported doing better today. He is chest pain free at rest. Did report some anginal pain with walking to the restroom and back, though was able to walk around the unit the other day okay. Shared his chest pain is brought on typically by more exertion than walking-> when he is picking things up or trying to climb onto his bunk bed.  He is very motivated to commit to his recovery program and get plugged back in.   Vital Signs Vitals:   03/22/24 0500 03/22/24 0730 03/22/24 1005 03/22/24 1101  BP:  (!) 131/91  118/86  Pulse:  84  67  Resp:  19  18  Temp:  98.6 F (37 C)  98.2 F (36.8 C)  TempSrc:  Oral  Oral  SpO2:  99%  99%  Weight: 87 kg  87 kg   Height:   6' 1 (1.854 m)     Intake/Output Summary (Last 24 hours) at 03/22/2024 1117 Last data filed at 03/22/2024 0829 Gross per 24 hour  Intake --  Output 1500 ml  Net -1500 ml      03/22/2024   10:05 AM 03/22/2024    5:00 AM 02/08/2024    7:53 PM  Last 3 Weights  Weight (lbs) 191 lb 12.8 oz 191 lb 12.8 oz 197 lb  Weight (kg) 87 kg 87 kg 89.359 kg      Telemetry/ECG  NSR HR 85 - Personally Reviewed  Physical Exam  GEN: No acute distress.   Cardiac: RRR, no murmurs, rubs, or gallops.  Respiratory: Clear to auscultation bilaterally.  Assessment & Plan  Benjamin Espinoza is a 59 y.o. male with a hx of CAD, HFmrEF, Hx of CVA, PAD s/p R fem-pop, TAA, tobacco use, marijuana use, cocaine use, hypertension, hyperlipidemia, and chronic pain who had presented to the ED for right arm numbness. Code stroke was initiated and head imaging showed subacute/chronic ischemia to left parietal lobe with no evidence of acute pathology. During admission patient reported intermittent chest pain, cardiology was consulted.   CAD Chest Pain S/p CABG in 2017 S/p PCI to SVG-RPDA in  2021 with subsequent CTO in 2022 Most recent LHC listed above, showing extensive obstructive disease. Intervention at that time was deferred given medication non-compliance and ongoing polysubstance use.  ECG without acute ischemic changes Troponin flat and minimally elevated indicating demand ischemia   Patient reported anginal chest pain prior to this admission,though yesterday described heavy chest pain at rest relieved briefly by SL Ng x 6. His anti-anginals were further titrated.  Patient is currently chest pain free.   While he does have new RWMA on his echocardiogram this admission, this could be subacute. Patient has had ongoing chest pain for a long time, though better today with the addition of his anti-anginals. Through SDM, patient is willing to walk around the unit today, if that goes well the plan will be to continue medical management while he pursues polysubstance recovery and medication adherence. The goal is to improve patient's outcomes if intervention were to be pursued. We will establish close cardiology follow up to assess symptoms, recovery, and medical therapy and once appropriate schedule coronary evaluation. MD to make final decision. Will follow up on how patient does with walk around the unit.    Continue ASA 81 Continue Coreg  3.125 mg BID Continue Imdur   60 mg Continue Ranexa  1000 mg BID Ensure patient has SL NG at home at discharge   Hyperlipidemia LDL 167 HDL 49 Continue lipitor  80 mg, will need repeat lipid panel and LFTs in 6-8 weeks   HFimpEF EF this admission 50-55%, new RWMA in the apical region.  Most likely ICM, see CAD above.    Continue coreg  as above Would hold on further titration for now, BP today as below and would like to avoid hypotension given his extensive CAD and recent subacute partial lobe infarct   Hypertension BP: 118/86 Continue medications as above    PAD S/p fem-pop bypass [in 2021 and 2024] with recent mechanical thrombectomy  01/2024 Follows with vascular  Continue xarelto  Continue medications as above   TAA Infrarenal abdominal aortic dilation CT imaging this admission shows stable sizes. Continue to follow with vascular outpatient for surveillance    Per primary Subacute CVA with prior CVA Substance use   Close Follow up appointment made for 1/29 with his primary cardiologist.     For questions or updates, please contact [facility] Please consult [email or web address] for contact info under       Signed, Leontine LOISE Salen, PA-C   "

## 2024-03-23 ENCOUNTER — Encounter (HOSPITAL_COMMUNITY): Payer: Self-pay | Admitting: *Deleted

## 2024-03-23 ENCOUNTER — Telehealth: Payer: Self-pay

## 2024-03-23 ENCOUNTER — Other Ambulatory Visit: Payer: Self-pay

## 2024-03-23 ENCOUNTER — Emergency Department (HOSPITAL_COMMUNITY)
Admission: EM | Admit: 2024-03-23 | Discharge: 2024-03-23 | Disposition: A | Discharge status: Home / Self Care | Attending: Emergency Medicine | Admitting: Emergency Medicine

## 2024-03-23 DIAGNOSIS — Z7189 Other specified counseling: Secondary | ICD-10-CM | POA: Diagnosis present

## 2024-03-23 DIAGNOSIS — Z7901 Long term (current) use of anticoagulants: Secondary | ICD-10-CM | POA: Diagnosis not present

## 2024-03-23 DIAGNOSIS — Z7982 Long term (current) use of aspirin: Secondary | ICD-10-CM | POA: Insufficient documentation

## 2024-03-23 NOTE — ED Triage Notes (Signed)
 Pt here and discharged yesterday after having a stroke, was [facility] to [facility] but was woken up this morning and told that they can't handle his condition and he needed to find somewhere else to go. Pt came back here to speak with social work again about his options.

## 2024-03-23 NOTE — Transitions of Care (Post Inpatient/ED Visit) (Unsigned)
" ° °  03/23/2024  Name: Benjamin Espinoza MRN: [id number] DOB: [birth year]  Today's TOC FU Call Status: Today's TOC FU Call Status:: Unsuccessful Call (1st Attempt) Unsuccessful Call (1st Attempt) Date: 03/23/24  Attempted to reach the patient regarding the most recent Inpatient/ED visit.  Follow Up Plan: Additional outreach attempts will be made to reach the patient to complete the Transitions of Care (Post Inpatient/ED visit) call.   Signature Julian Lemmings, LPN [facility] Nurse Health Advisor Direct Dial [phone]  "

## 2024-03-23 NOTE — Discharge Instructions (Signed)
 Social work was able to get patient a bed at [facility].  No other medical issues

## 2024-03-23 NOTE — ED Notes (Signed)
 Sw had gotten pt a spot at [facility], pt states that he is ready to go, read and reviewed d/c instructions, pt waiting on girlfriend for a ride home.

## 2024-03-23 NOTE — ED Provider Notes (Signed)
 " [place] [facility] Provider Note   CSN: [id number] Arrival date & time: 03/23/24  9051     Patient presents with: Social Work Consult   Benjamin Espinoza is a 59 y.o. male.  He was just discharged yesterday after being admitted for chest pain and right arm pain and numbness.  He was seen by cardiology and neurology.  Ultimately felt to be angina and would need outpatient follow-up with cardiology.  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  He tells me that today the facility told him that they could not accommodate his rehab with his ongoing medical issues and that he needed to find another place.  He is presenting here today looking for social work help.  He said he did have some chest discomfort earlier today that is resolved.  No fevers chills nausea vomiting shortness of breath.  No current numbness or weakness.   The history is provided by the patient.       Prior to Admission medications  Medication Sig Start Date End Date Taking? Authorizing Provider  acetaminophen  (TYLENOL ) 500 MG tablet Take 1 tablet (500 mg total) by mouth every 6 (six) hours as needed for mild pain (pain score 1-3), headache or fever. 02/04/24   Harold Scholz, MD  aspirin  EC 81 MG tablet Take 1 tablet (81 mg total) by mouth daily. Swallow whole. 02/04/24   Harold Scholz, MD  atorvastatin  (LIPITOR ) 80 MG tablet Take 1 tablet (80 mg total) by mouth daily. 03/22/24   Harrie Bruckner, DO  carvedilol  (COREG ) 3.125 MG tablet Take 1 tablet (3.125 mg total) by mouth 2 (two) times daily with a meal. 02/04/24   Harold Scholz, MD  [Paused] cilostazol  (PLETAL ) 100 MG tablet Take 1 tablet (100 mg total) by mouth 2 (two) times daily. Patient not taking: Reported on 12/12/2023 Wait to take this until your doctor or other care provider tells you to start  again. 11/06/23   Dean Clarity, MD  cyclobenzaprine  (FLEXERIL ) 5 MG tablet Take 1 tablet (5 mg total) by mouth 3 (three) times daily as needed for muscle spasms. 03/22/24   Harrie Bruckner, DO  diclofenac  Sodium (VOLTAREN ) 1 % GEL Apply 4 g topically 4 (four) times daily. Apply to painful area shoulder/neck avoiding any areas with skin breakdown 03/22/24   Harrie Bruckner, DO  gabapentin  (NEURONTIN ) 300 MG capsule Take 1 capsule (300 mg total) by mouth 3 (three) times daily as needed (neuropathy, pain). 02/04/24   Harold Scholz, MD  isosorbide  mononitrate (IMDUR ) 30 MG 24 hr tablet Take 3 tablets (90 mg total) by mouth daily. 03/23/24   Harrie Bruckner, DO  lidocaine  (LIDODERM ) 5 % Place 1 patch onto the skin at bedtime. Remove & Discard patch within 12 hours or as directed by MD 03/22/24   Harrie Bruckner, DO  nicotine  (NICODERM CQ  - DOSED IN MG/24 HOURS) 14 mg/24hr patch Place 1 patch (14 mg total) onto the skin daily. 03/22/24   Harrie Bruckner, DO  nicotine  polacrilex (NICORETTE ) 4 MG gum Take 1 each (4 mg total) by mouth as needed for smoking cessation. Patient not taking: Reported on 01/31/2024 12/01/23   Kennyth Domino, FNP  nitroGLYCERIN  (NITROSTAT ) 0.4 MG SL tablet Place 1 tablet (0.4 mg total) under the tongue every 5 (five) minutes as needed for chest pain. Patient not taking: Reported on 01/31/2024 12/12/23  Rana, Madison L, NP  oxyCODONE  (OXY IR/ROXICODONE ) 5 MG immediate release tablet Take 1 tablet (5 mg total) by mouth daily as needed for severe pain (pain score 7-10) or breakthrough pain. 03/22/24   Harrie Bruckner, DO  ranolazine  (RANEXA ) 1000 MG SR tablet Take 1 tablet (1,000 mg total) by mouth 2 (two) times daily. 03/22/24   Harrie Bruckner, DO  rivaroxaban  (XARELTO ) 20 MG TABS tablet Take 1 tablet (20 mg total) by mouth daily. 02/04/24   Harold Scholz, MD    Allergies: Patient has no known allergies.    Review of Systems  Updated Vital Signs BP (!)  148/78 (BP Location: Right Arm)   Pulse 91   Temp 98.1 F (36.7 C)   Resp 16   Ht 6' 1 (1.854 m)   Wt 87 kg   SpO2 96%   BMI 25.30 kg/m   Physical Exam Vitals and nursing note reviewed.  Constitutional:      General: He is not in acute distress.    Appearance: Normal appearance. He is well-developed.  HENT:     Head: Normocephalic and atraumatic.  Eyes:     Conjunctiva/sclera: Conjunctivae normal.  Cardiovascular:     Rate and Rhythm: Normal rate and regular rhythm.     Heart sounds: No murmur heard. Pulmonary:     Effort: Pulmonary effort is normal. No respiratory distress.     Breath sounds: Normal breath sounds.  Abdominal:     Palpations: Abdomen is soft.     Tenderness: There is no abdominal tenderness.  Musculoskeletal:        General: No deformity.     Cervical back: Neck supple.  Skin:    General: Skin is warm and dry.     Capillary Refill: Capillary refill takes less than 2 seconds.  Neurological:     General: No focal deficit present.     Mental Status: He is alert.     Motor: No weakness.     Gait: Gait normal.     (all labs ordered are listed, but only abnormal results are displayed) Labs Reviewed - No data to display  EKG: None  Radiology: DG Shoulder Right Result Date: 03/21/2024 CLINICAL DATA:  Right shoulder pain. EXAM: RIGHT SHOULDER - 2+ VIEW COMPARISON:  None Available. FINDINGS: No acute fracture or dislocation. The bones are well mineralized. No arthritic changes. The soft tissues are unremarkable. Median sternotomy wires noted. IMPRESSION: Negative. Electronically Signed   By: Vanetta Chou M.D.   On: 03/21/2024 11:11     Procedures   Medications Ordered in the ED - No data to display  Clinical Course as of 03/23/24 1710  Fri Mar 23, 2024  1021 Social work was contacted by engineer, civil (consulting) and they will be down to talk to patient to discuss options. [MB]    Clinical Course User Index [MB] Towana Ozell BROCKS, MD                                  Medical Decision Making  This patient complains of needing new rehab center. Previous records obtained and reviewed in epic including discharge summary from yesterday I consulted social work and discussed lab and imaging findings and discussed disposition.  Social determinants considered, multiple barriers Critical Interventions: None  After the interventions stated above, I reevaluated the patient and found patient to be resting comfortably otherwise asymptomatic Admission and further testing considered, social work was able  to find him a new place at [facility].  Patient has no other active medical issues at this time.  Discussed outpatient follow-up with him.      Final diagnoses:  Advised to contact social worker    ED Discharge Orders     None          Towana Ozell BROCKS, MD 03/23/24 1712  "

## 2024-03-23 NOTE — ED Notes (Signed)
 Reached out to SW about resources for pt, states that she will come talk with patient.

## 2024-03-26 NOTE — Progress Notes (Unsigned)
"   °  °  Cardiology Office Note Date:  03/27/2024  ID:  DARNEL MCHAN, DOB 19-Feb-1966, MRN [id number] PCP:  [name], [name], DO  Cardiologist:  [name], MD  No chief complaint on file.    Problems CAD S/p CABG (2017): LIMA-mid LAD, SVG-OM3, SVG-OM1, SVG-PDA 2021: PCI of SVG-PDA LHC 4/25: patient LIMA, SVG-OM3, 80% prox stenosis of SVG-OM1, and occluded SVG-RPDA TTE 8/25: 50-55% TTE 1/26: 50-55%, WMAs, mild LVH CVA 8/25 PAD s/p R fem/pop bypass Former crack/cocaine user Current tobacco user TAA: CTA 8/25- AR 4.7, AA 4.0; need CT in 8/26 and AAA ultrasound in 8/28 M: AE5, AN80, ASA81, CL6.25, ISMN90, LN50, RN1000BID, RN20  Visits  10/25: started amlo 5; decline to increase coreg  due to ED, re-started Imdur  1/26 Admit : subacute CVA and CP--> TTE w/ new wall motion abnormalities; LDL 167 (not adherent to statin) 1/26: Lower RN20 to low dose    Discussed the use of AI scribe software for clinical note transcription with the patient, who gave verbal consent to proceed.  History of Present Illness     ROS: Please see the history of present illness. All other systems are reviewed and negative.    PHYSICAL EXAM: VS:  There were no vitals taken for this visit. , BMI There is no height or weight on file to calculate BMI. GEN: Well nourished, well developed, in no acute distress HEENT: normal Neck: no JVD, carotid bruits, or masses Cardiac: ***RRR; no murmurs, rubs, or gallops,no edema  Respiratory:  CTAB bilaterally, normal work of breathing GI: soft, nontender, nondistended, + BS Extremities: No LE edema Skin: warm and dry, no rash Neuro:  Strength and sensation are intact  Recent Labs: Reviewed  Studies: Reviewed  Assessment and Plan Assessment & Plan      Signed, [name], MD  03/27/2024 10:01 AM    [facility] "

## 2024-03-26 NOTE — Transitions of Care (Post Inpatient/ED Visit) (Unsigned)
" ° °  03/26/2024  Name: Benjamin Espinoza MRN: [id number] DOB: [birth year]  Today's TOC FU Call Status: Today's TOC FU Call Status:: Unsuccessful Call (2nd Attempt) Unsuccessful Call (1st Attempt) Date: 03/23/24 Unsuccessful Call (2nd Attempt) Date: 03/26/24  Attempted to reach the patient regarding the most recent Inpatient/ED visit.  Follow Up Plan: Additional outreach attempts will be made to reach the patient to complete the Transitions of Care (Post Inpatient/ED visit) call.   Signature Julian Lemmings, LPN [facility] Nurse Health Advisor Direct Dial [phone]  "

## 2024-03-27 NOTE — Transitions of Care (Post Inpatient/ED Visit) (Signed)
" ° °  03/27/2024  Name: Benjamin Espinoza MRN: [id number] DOB: [birth year]  Today's TOC FU Call Status: Today's TOC FU Call Status:: Unsuccessful Call (3rd Attempt) Unsuccessful Call (1st Attempt) Date: 03/23/24 Unsuccessful Call (2nd Attempt) Date: 03/26/24 Unsuccessful Call (3rd Attempt) Date: 03/27/24  Attempted to reach the patient regarding the most recent Inpatient/ED visit.  Follow Up Plan: No further outreach attempts will be made at this time. We have been unable to contact the patient.  Signature Julian Lemmings, LPN [facility] Nurse Health Advisor Direct Dial [phone]  "

## 2024-03-29 ENCOUNTER — Ambulatory Visit

## 2024-03-29 DIAGNOSIS — I77810 Thoracic aortic ectasia: Secondary | ICD-10-CM

## 2024-03-29 DIAGNOSIS — I739 Peripheral vascular disease, unspecified: Secondary | ICD-10-CM

## 2024-03-29 DIAGNOSIS — I7143 Infrarenal abdominal aortic aneurysm, without rupture: Secondary | ICD-10-CM

## 2024-03-29 DIAGNOSIS — F1410 Cocaine abuse, uncomplicated: Secondary | ICD-10-CM

## 2024-03-29 DIAGNOSIS — I5020 Unspecified systolic (congestive) heart failure: Secondary | ICD-10-CM

## 2024-03-29 DIAGNOSIS — Z720 Tobacco use: Secondary | ICD-10-CM

## 2024-03-29 DIAGNOSIS — I25708 Atherosclerosis of coronary artery bypass graft(s), unspecified, with other forms of angina pectoris: Secondary | ICD-10-CM

## 2024-04-03 ENCOUNTER — Emergency Department
Admission: EM | Admit: 2024-04-03 | Discharge: 2024-04-03 | Disposition: A | Discharge status: Home / Self Care | Attending: Emergency Medicine | Admitting: Emergency Medicine

## 2024-04-03 DIAGNOSIS — M25511 Pain in right shoulder: Secondary | ICD-10-CM | POA: Insufficient documentation

## 2024-04-03 DIAGNOSIS — Z5321 Procedure and treatment not carried out due to patient leaving prior to being seen by health care provider: Secondary | ICD-10-CM | POA: Insufficient documentation

## 2024-04-04 ENCOUNTER — Other Ambulatory Visit: Payer: Self-pay

## 2024-04-05 ENCOUNTER — Telehealth: Payer: Self-pay | Admitting: *Deleted

## 2024-04-05 ENCOUNTER — Ambulatory Visit: Payer: Self-pay

## 2024-04-05 NOTE — Congregational Nurse Program (Signed)
" °  Dept: [phone]   Congregational Nurse Program Note  Date of Encounter: 04/05/2024  Past Medical History: Past Medical History:  Diagnosis Date   Abnormal stress test    Acute left ankle pain 03/23/2023   Anxiety    Asthma    as a child   Atypical chest pain 05/07/2017   Chest pain  Unstable angina 06/15/2023   Chronic bilateral low back pain with left-sided sciatica 01/11/2023   Chronic health problem 06/15/2023   Chronic pain syndrome 03/29/2023   Cocaine abuse (HCC) 06/16/2023   Coronary artery disease    DDD (degenerative disc disease), lumbar 11/08/2019   Formatting of this note might be different from the original. 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 There is mild dilatation of the ascending aorta, measuring 43 mm. There is mild dilatation of the aortic root, measuring 44 mm.      Encounter for medication monitoring 01/10/2023   Encounter for well adult exam with abnormal findings 04/08/2023   Essential hypertension    Heart failure with mid-range ejection fraction (HFmEF) (HCC) 06/17/2023   06/16/23 transthoracic echocardiogram: Left Ventricle: Left ventricular ejection fraction 45 to 50%. The left ventricle has mildly decreased function. No left ventricular hypertrophy. 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 [place]    Hyperlipidemia    Impaired mobility and ADLs 03/23/2023   Ischemic cardiomyopathy 12/17/2020   Lumbar radiculopathy 07/05/2023   Myocardial infarction [facility]) 2017   NSTEMI (non-ST elevated myocardial infarction) (HCC) 11/28/2020   OAB (overactive bladder) 01/11/2023   PAD (peripheral artery disease) 09/21/2022   PVD (peripheral vascular disease) 09/21/2019   Right leg pain 03/23/2023   Shortness of breath    Sinus bradycardia 11/29/2019   Tobacco use disorder    Unhoused person 06/16/2023   Unstable angina (HCC) 06/15/2023     Encounter Details:  Community Questionnaire - 04/05/24 1100       Questionnaire   Ask client: Do you give verbal consent for me to treat you today? Yes    Student Assistance N/A    Location Patient Served  [facility]    Encounter Setting Phone/Text/Email    Population Status Housed    Insurance Medicare;Medicaid    Insurance/Financial Assistance Referral N/A    Medication Have Medication Insecurities    Medical Provider Yes    Medical Referrals Made Specialty Clinic   [facility]   Medical Appointment Completed N/A    Screenings CN Performed (remember to also record results) NA    CN Interventions Case Management;Navigate Healthcare System;Health Counseling    ED Visit Averted Yes          RN received call from client who is in need of his medications while in rehab in [place], [place]. RN able to call and have client's medications transferred from [facility] on [address] to [facility] in [place]. Client needed refill on nitroglycerin , RN called PCP for refill. Office to call RN with update. No other acute needs at this time.   "

## 2024-04-05 NOTE — Telephone Encounter (Signed)
 Call to patient -stated was unable to come for appointment is currently in Rehab and his insurance card will not arrive for another 7-10 days.  Facility does not provide transportation.  Needs a refill on his Nitroglycerine as he has to attend meetings and sometimes has chest pain when he walks to the m.  Patient got a 90 day supply of his Imdur  270 tablets on yesterday per Pharmacy.  Wants to get another refill if possible.  Has not seen a Cardiologist outside of the hospital  as of yet. Saw 1 at recent hospital stay.  Did not get refill on his nitroglycerin  prior to discharge.  Needs prescriptions sent to the [facility]  in [place].   Plans to make an appointment to come to Clinics when his insurance transportation is approved.            Copied from CRM #[id number]. Topic: Clinical - Prescription Issue >> Apr 05, 2024 10:28 AM DeAngela L wrote: Reason for CRM: the patient is in a rehab facility called the [facility] cause he was told to go to rehab for additional help with substance abuse and the patient has a concern with getting to his appointment since the facility is located in [place] and he is having transportation concerns and will miss his appointment today and also the patient needs a refill for two meds  And he can't make or reschedule any appointment till March  Patient num  [phone]   nitroGLYCERIN  (NITROSTAT ) 0.4 MG SL tablet  isosorbide  mononitrate (IMDUR ) 30 MG 24 hr tablet  also patient has a question about how he should be taking this medication also cause it states 3 tablets a day and he asked is this 1 tablet every few hours ?  [facility]  Address: [address] [place], [place] [address] Phone: [phone]

## 2024-04-05 NOTE — Telephone Encounter (Signed)
 RTC to patient informed him that he should take all 3 pills of the Imdur  at the same time daily.  Patient was also informed that a message has been sent to the Cardiology to refill his Nitroglycerine as the Clinics did not prescribe the medication.  Patient voiced understanding of and will await refill from Cardiology.
